# Patient Record
Sex: Male | Born: 1940 | Race: Black or African American | Hispanic: No | Marital: Single | Smoking: Former smoker
Health system: Southern US, Community
[De-identification: ages and names within clinical notes are randomized; demographics above are authoritative.]

## PROBLEM LIST (undated history)

## (undated) ENCOUNTER — Emergency Department (HOSPITAL_COMMUNITY): Discharge status: Against Medical Advice | Payer: Medicare HMO

## (undated) DIAGNOSIS — N400 Enlarged prostate without lower urinary tract symptoms: Secondary | ICD-10-CM

## (undated) DIAGNOSIS — T884XXA Failed or difficult intubation, initial encounter: Secondary | ICD-10-CM

## (undated) DIAGNOSIS — E559 Vitamin D deficiency, unspecified: Secondary | ICD-10-CM

## (undated) DIAGNOSIS — I10 Essential (primary) hypertension: Secondary | ICD-10-CM

## (undated) DIAGNOSIS — H9190 Unspecified hearing loss, unspecified ear: Secondary | ICD-10-CM

## (undated) DIAGNOSIS — M1990 Unspecified osteoarthritis, unspecified site: Secondary | ICD-10-CM

## (undated) DIAGNOSIS — C6290 Malignant neoplasm of unspecified testis, unspecified whether descended or undescended: Secondary | ICD-10-CM

## (undated) DIAGNOSIS — E785 Hyperlipidemia, unspecified: Secondary | ICD-10-CM

## (undated) HISTORY — DX: Vitamin D deficiency, unspecified: E55.9

## (undated) HISTORY — DX: Hyperlipidemia, unspecified: E78.5

## (undated) HISTORY — PX: HEMORROIDECTOMY: SUR656

## (undated) HISTORY — PX: PROSTATE SURGERY: SHX751

## (undated) HISTORY — PX: SCALP LACERATION REPAIR: SHX6089

---

## 2004-02-28 ENCOUNTER — Emergency Department (HOSPITAL_COMMUNITY): Admission: EM | Admit: 2004-02-28 | Discharge: 2004-02-28 | Payer: Self-pay | Admitting: Emergency Medicine

## 2004-06-17 ENCOUNTER — Ambulatory Visit (HOSPITAL_COMMUNITY): Admission: RE | Admit: 2004-06-17 | Discharge: 2004-06-17 | Payer: Self-pay | Admitting: General Surgery

## 2004-07-02 ENCOUNTER — Emergency Department (HOSPITAL_COMMUNITY): Admission: EM | Admit: 2004-07-02 | Discharge: 2004-07-02 | Payer: Self-pay | Admitting: Emergency Medicine

## 2005-02-27 ENCOUNTER — Emergency Department (HOSPITAL_COMMUNITY): Admission: EM | Admit: 2005-02-27 | Discharge: 2005-02-27 | Payer: Self-pay | Admitting: Otolaryngology

## 2005-03-22 ENCOUNTER — Emergency Department (HOSPITAL_COMMUNITY): Admission: EM | Admit: 2005-03-22 | Discharge: 2005-03-22 | Payer: Self-pay | Admitting: Emergency Medicine

## 2005-03-23 ENCOUNTER — Emergency Department (HOSPITAL_COMMUNITY): Admission: EM | Admit: 2005-03-23 | Discharge: 2005-03-23 | Payer: Self-pay | Admitting: Emergency Medicine

## 2005-04-12 ENCOUNTER — Inpatient Hospital Stay (HOSPITAL_COMMUNITY): Admission: EM | Admit: 2005-04-12 | Discharge: 2005-04-13 | Payer: Self-pay | Admitting: Emergency Medicine

## 2005-09-07 ENCOUNTER — Emergency Department (HOSPITAL_COMMUNITY): Admission: EM | Admit: 2005-09-07 | Discharge: 2005-09-07 | Payer: Self-pay | Admitting: Emergency Medicine

## 2005-09-09 ENCOUNTER — Emergency Department (HOSPITAL_COMMUNITY): Admission: EM | Admit: 2005-09-09 | Discharge: 2005-09-09 | Payer: Self-pay | Admitting: Emergency Medicine

## 2005-09-11 ENCOUNTER — Emergency Department (HOSPITAL_COMMUNITY): Admission: EM | Admit: 2005-09-11 | Discharge: 2005-09-11 | Payer: Self-pay | Admitting: Emergency Medicine

## 2006-01-22 ENCOUNTER — Emergency Department (HOSPITAL_COMMUNITY): Admission: EM | Admit: 2006-01-22 | Discharge: 2006-01-22 | Payer: Self-pay | Admitting: Emergency Medicine

## 2006-05-21 ENCOUNTER — Ambulatory Visit (HOSPITAL_COMMUNITY): Admission: RE | Admit: 2006-05-21 | Discharge: 2006-05-21 | Payer: Self-pay | Admitting: Family Medicine

## 2006-11-16 ENCOUNTER — Emergency Department (HOSPITAL_COMMUNITY): Admission: EM | Admit: 2006-11-16 | Discharge: 2006-11-16 | Payer: Self-pay | Admitting: Emergency Medicine

## 2006-12-30 ENCOUNTER — Ambulatory Visit (HOSPITAL_COMMUNITY): Admission: RE | Admit: 2006-12-30 | Discharge: 2006-12-30 | Payer: Self-pay | Admitting: Ophthalmology

## 2007-01-27 ENCOUNTER — Ambulatory Visit (HOSPITAL_COMMUNITY): Admission: RE | Admit: 2007-01-27 | Discharge: 2007-01-27 | Payer: Self-pay | Admitting: Ophthalmology

## 2008-01-28 ENCOUNTER — Emergency Department (HOSPITAL_COMMUNITY): Admission: EM | Admit: 2008-01-28 | Discharge: 2008-01-28 | Payer: Self-pay | Admitting: Emergency Medicine

## 2008-02-13 ENCOUNTER — Emergency Department (HOSPITAL_COMMUNITY): Admission: EM | Admit: 2008-02-13 | Discharge: 2008-02-13 | Payer: Self-pay | Admitting: Emergency Medicine

## 2009-08-08 ENCOUNTER — Inpatient Hospital Stay (HOSPITAL_COMMUNITY)
Admission: EM | Admit: 2009-08-08 | Discharge: 2009-08-12 | Payer: Self-pay | Source: Home / Self Care | Admitting: Emergency Medicine

## 2010-03-29 LAB — GLUCOSE, CAPILLARY
Glucose-Capillary: 102 mg/dL — ABNORMAL HIGH (ref 70–99)
Glucose-Capillary: 102 mg/dL — ABNORMAL HIGH (ref 70–99)
Glucose-Capillary: 104 mg/dL — ABNORMAL HIGH (ref 70–99)
Glucose-Capillary: 108 mg/dL — ABNORMAL HIGH (ref 70–99)
Glucose-Capillary: 108 mg/dL — ABNORMAL HIGH (ref 70–99)
Glucose-Capillary: 111 mg/dL — ABNORMAL HIGH (ref 70–99)
Glucose-Capillary: 119 mg/dL — ABNORMAL HIGH (ref 70–99)
Glucose-Capillary: 122 mg/dL — ABNORMAL HIGH (ref 70–99)
Glucose-Capillary: 141 mg/dL — ABNORMAL HIGH (ref 70–99)
Glucose-Capillary: 142 mg/dL — ABNORMAL HIGH (ref 70–99)
Glucose-Capillary: 95 mg/dL (ref 70–99)

## 2010-03-29 LAB — BASIC METABOLIC PANEL
BUN: 5 mg/dL — ABNORMAL LOW (ref 6–23)
CO2: 17 mEq/L — ABNORMAL LOW (ref 19–32)
CO2: 24 mEq/L (ref 19–32)
Chloride: 109 mEq/L (ref 96–112)
Chloride: 112 mEq/L (ref 96–112)
Chloride: 113 mEq/L — ABNORMAL HIGH (ref 96–112)
GFR calc Af Amer: >60 mL/min (ref >60)
GFR calc non Af Amer: >60 mL/min (ref >60)
Glucose, Bld: 117 mg/dL — ABNORMAL HIGH (ref 70–99)
Potassium: 2.9 mEq/L — ABNORMAL LOW (ref 3.5–5.1)
Potassium: 3.5 mEq/L (ref 3.5–5.1)
Potassium: 3.6 mEq/L (ref 3.5–5.1)
Sodium: 139 mEq/L (ref 135–145)
Sodium: 139 mEq/L (ref 135–145)

## 2010-03-29 LAB — RAPID URINE DRUG SCREEN, HOSP PERFORMED
Barbiturates: NOT DETECTED
Benzodiazepines: NOT DETECTED
Cocaine: NOT DETECTED
Opiates: NOT DETECTED
Tetrahydrocannabinol: NOT DETECTED

## 2010-03-29 LAB — TROPONIN I: Troponin I: 0.01 ng/mL (ref 0.00–0.06)

## 2010-03-29 LAB — DIFFERENTIAL
Basophils Absolute: 0.1 10*3/uL (ref 0.0–0.1)
Lymphocytes Relative: 8 % — ABNORMAL LOW (ref 12–46)
Lymphs Abs: 0.8 10*3/uL (ref 0.7–4.0)
Monocytes Relative: 3 % (ref 3–12)
Neutrophils Relative %: 88 % — ABNORMAL HIGH (ref 43–77)

## 2010-03-29 LAB — ETHANOL: Alcohol, Ethyl (B): 230 mg/dL — ABNORMAL HIGH (ref 0–10)

## 2010-03-29 LAB — HEPATIC FUNCTION PANEL
ALT: 27 U/L (ref 0–53)
AST: 24 U/L (ref 0–37)
Alkaline Phosphatase: 45 U/L (ref 39–117)
Bilirubin, Direct: 0.1 mg/dL (ref 0.0–0.3)
Indirect Bilirubin: 0.6 mg/dL (ref 0.3–0.9)
Total Protein: 5.5 g/dL — ABNORMAL LOW (ref 6.0–8.3)

## 2010-03-29 LAB — CK TOTAL AND CKMB (NOT AT ARMC)
Relative Index: 1.8 (ref 0.0–2.5)
Total CK: 142 U/L (ref 7–232)

## 2010-03-29 LAB — CBC
HCT: 37.2 % — ABNORMAL LOW (ref 39.0–52.0)
Platelets: 189 10*3/uL (ref 150–400)
RBC: 3.54 MIL/uL — ABNORMAL LOW (ref 4.22–5.81)

## 2010-04-29 LAB — CBC
MCV: 105.8 fL — ABNORMAL HIGH (ref 78.0–100.0)
Platelets: 274 10*3/uL (ref 150–400)
RBC: 3.84 MIL/uL — ABNORMAL LOW (ref 4.22–5.81)
WBC: 5.7 10*3/uL (ref 4.0–10.5)

## 2010-04-29 LAB — BASIC METABOLIC PANEL
BUN: 16 mg/dL (ref 6–23)
Calcium: 10.3 mg/dL (ref 8.4–10.5)
Creatinine, Ser: 0.95 mg/dL (ref 0.4–1.5)
GFR calc Af Amer: >60 mL/min (ref >60)
GFR calc non Af Amer: >60 mL/min (ref >60)

## 2010-04-29 LAB — DIFFERENTIAL
Eosinophils Absolute: 0.1 10*3/uL (ref 0.0–0.7)
Lymphocytes Relative: 20 % (ref 12–46)
Lymphs Abs: 1.1 10*3/uL (ref 0.7–4.0)
Monocytes Relative: 5 % (ref 3–12)
Neutro Abs: 4.1 10*3/uL (ref 1.7–7.7)
Neutrophils Relative %: 71 % (ref 43–77)

## 2010-04-29 LAB — ETHANOL: Alcohol, Ethyl (B): 216 mg/dL — ABNORMAL HIGH (ref 0–10)

## 2010-05-28 ENCOUNTER — Emergency Department (HOSPITAL_COMMUNITY)
Admission: EM | Admit: 2010-05-28 | Discharge: 2010-05-28 | Disposition: A | Discharge status: Home / Self Care | Payer: MEDICARE | Attending: Emergency Medicine | Admitting: Emergency Medicine

## 2010-05-28 ENCOUNTER — Emergency Department (HOSPITAL_COMMUNITY): Payer: MEDICARE

## 2010-05-28 DIAGNOSIS — M47812 Spondylosis without myelopathy or radiculopathy, cervical region: Secondary | ICD-10-CM | POA: Insufficient documentation

## 2010-05-28 DIAGNOSIS — M129 Arthropathy, unspecified: Secondary | ICD-10-CM | POA: Insufficient documentation

## 2010-05-28 DIAGNOSIS — I10 Essential (primary) hypertension: Secondary | ICD-10-CM | POA: Insufficient documentation
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
## 2010-05-30 NOTE — Op Note (Signed)
NAME:  [name], MOTTERN NO.:  [id number]   MEDICAL RECORD NO.:  [id number]          PATIENT TYPE:  AMB   LOCATION:  DAY                           FACILITY:  [facility]   PHYSICIAN:  Dirk Dress. Katrinka Blazing, M.D.   DATE OF BIRTH:  July 26, 1940   DATE OF PROCEDURE:  06/17/2004  DATE OF DISCHARGE:                                 OPERATIVE REPORT   PREOPERATIVE DIAGNOSIS:  Right posterior neck mass.   POSTOPERATIVE DIAGNOSIS:  Right posterior neck mass, 6 cm.   PROCEDURE:  Excision of mass, right posterior neck, 6 cm.   SURGEON:  Dr. Katrinka Blazing.   DESCRIPTION:  Under general LMA anesthesia, the patient's neck was prepped  and draped in the sterile field. Transverse incision was made. Excision  extended downward into the subcutaneous tissue. In the subcutaneous tissue,  there was a multilobulated, fatty tissue with multiple fibrous septations.  Using sharp and blunt dissection, this was removed. There was a large vein  in the base of the mass, but this was not injured. The skin and small amount  of subcutaneous tissue was sutured down to the fascia with 0 Monocryl. Skin  was closed with interrupted 3-0 Prolene. Dressing was placed. The patient  was awakened from anesthesia uneventfully, transferred to a bed, and taken  to the post-anesthesia care unit for monitoring.       LCS/MEDQ  D:  06/17/2004  T:  06/17/2004  Job:  [id number]
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
## 2010-10-17 LAB — BASIC METABOLIC PANEL
CO2: 26
Calcium: 9.5
GFR calc Af Amer: >60
Potassium: 4
Sodium: 138

## 2010-10-17 LAB — HEMOGLOBIN AND HEMATOCRIT, BLOOD: HCT: 37.2 — ABNORMAL LOW

## 2011-12-25 ENCOUNTER — Other Ambulatory Visit (HOSPITAL_COMMUNITY): Payer: Self-pay | Admitting: Family Medicine

## 2011-12-25 DIAGNOSIS — M545 Low back pain: Secondary | ICD-10-CM

## 2011-12-29 ENCOUNTER — Ambulatory Visit (HOSPITAL_COMMUNITY)
Admission: RE | Admit: 2011-12-29 | Discharge: 2011-12-29 | Disposition: A | Discharge status: Home / Self Care | Payer: Medicare HMO | Source: Ambulatory Visit | Attending: Family Medicine | Admitting: Family Medicine

## 2011-12-29 DIAGNOSIS — M47817 Spondylosis without myelopathy or radiculopathy, lumbosacral region: Secondary | ICD-10-CM | POA: Insufficient documentation

## 2011-12-29 DIAGNOSIS — M545 Low back pain: Secondary | ICD-10-CM

## 2011-12-29 DIAGNOSIS — M5137 Other intervertebral disc degeneration, lumbosacral region: Secondary | ICD-10-CM | POA: Insufficient documentation

## 2011-12-29 DIAGNOSIS — M51379 Other intervertebral disc degeneration, lumbosacral region without mention of lumbar back pain or lower extremity pain: Secondary | ICD-10-CM | POA: Insufficient documentation

## 2012-03-03 ENCOUNTER — Encounter (INDEPENDENT_AMBULATORY_CARE_PROVIDER_SITE_OTHER): Payer: Self-pay | Admitting: *Deleted

## 2012-03-08 ENCOUNTER — Encounter (HOSPITAL_COMMUNITY): Payer: Self-pay | Admitting: Pharmacy Technician

## 2012-03-08 NOTE — H&P (Signed)
  NTS SOAP Note  Vital Signs:  Vitals as of: 03/08/2012: Systolic 144: Diastolic 67: Heart Rate 67: Temp 97.9F: Height 36ft 11in: Weight 166Lbs 0 Ounces: BMI 23  BMI : 23.15 kg/m2  Subjective: This 72 Years 72 Months old Male presents for a scalp mass.  Has been present for some time.  Recent growth noted.  Previously excised in remote past, thinks it was a lipoma.   Review of Symptoms:  Constitutional:unremarkable   Head:unremarkable    Eyes:unremarkable   Nose/Mouth/Throat:unremarkable Cardiovascular:  unremarkable   Respiratory:unremarkable   Gastrointestinal:  unremarkable   Genitourinary:unremarkable       joint, back, neck pain Skin:unremarkable Hematolgic/Lymphatic:unremarkable     Allergic/Immunologic:unremarkable     Past Medical History:    Reviewed   Past Medical History  Surgical History: none Medical Problems: HTN, BPH Allergies: nkda Medications: naproxin, lotrel, prostate meds   Social History:Reviewed  Social History  Preferred Language: English Race:  Black or African American Ethnicity: Other Age: 72 Years 5 Months Marital Status:  M Alcohol: 6 pack a week Recreational drug(s):  No   Smoking Status: Current every day smoker reviewed on 03/08/2012 Started Date: 01/13/1960 Packs per week:  Functional Status reviewed on mm/dd/yyyy ------------------------------------------------ Bathing: Normal Cooking: Normal Dressing: Normal Driving: Normal Eating: Normal Managing Meds: Normal Oral Care: Normal Shopping: Normal Toileting: Normal Transferring: Normal Walking: Normal Cognitive Status reviewed on mm/dd/yyyy ------------------------------------------------ Attention: Normal Decision Making: Normal Language: Normal Memory: Normal Motor: Normal Perception: Normal Problem Solving: Normal Visual and Spatial: Normal   Family History:  Reviewed   Family History  Is there a family history  AV:[id number]    Objective Information: General:  Well appearing, well nourished in no distress. 6 x 7cm posterior scalp mass, scar present.  Subcutaneous mass, rubbery. Neck:  Supple without lymphadenopathy.  Heart:  RRR, no murmur or gallop.  Normal S1, S2.  No S3, S4.  Lungs:    CTA bilaterally, no wheezes, rhonchi, rales.  Breathing unlabored.  Assessment:Neoplasm, scalp  Diagnosis &amp; Procedure:    Plan:  Scheduled for excision of scalp mass on 03/23/12.   Patient Education:Alternative treatments to surgery were discussed with patient (and family).  Risks and benefits  of procedure were fully explained to the patient (and family) who gave informed consent. Patient/family questions were addressed.  Follow-up:Pending Surgery

## 2012-03-17 ENCOUNTER — Other Ambulatory Visit: Payer: Self-pay

## 2012-03-17 ENCOUNTER — Encounter (HOSPITAL_COMMUNITY): Payer: Self-pay

## 2012-03-17 ENCOUNTER — Encounter (HOSPITAL_COMMUNITY)
Admission: RE | Admit: 2012-03-17 | Discharge: 2012-03-17 | Disposition: A | Discharge status: Home / Self Care | Payer: Medicare HMO | Source: Ambulatory Visit | Attending: General Surgery | Admitting: General Surgery

## 2012-03-17 HISTORY — DX: Unspecified hearing loss, unspecified ear: H91.90

## 2012-03-17 HISTORY — DX: Essential (primary) hypertension: I10

## 2012-03-17 HISTORY — DX: Benign prostatic hyperplasia without lower urinary tract symptoms: N40.0

## 2012-03-17 HISTORY — DX: Unspecified osteoarthritis, unspecified site: M19.90

## 2012-03-17 LAB — BASIC METABOLIC PANEL
BUN: 9 mg/dL (ref 6–23)
GFR calc non Af Amer: >90 mL/min (ref >90)
Glucose, Bld: 75 mg/dL (ref 70–99)
Potassium: 3.6 mEq/L (ref 3.5–5.1)

## 2012-03-17 LAB — CBC WITH DIFFERENTIAL/PLATELET
Basophils Relative: 1 % (ref 0–1)
Eosinophils Absolute: 0 10*3/uL (ref 0.0–0.7)
Hemoglobin: 12.9 g/dL — ABNORMAL LOW (ref 13.0–17.0)
MCH: 34.1 pg — ABNORMAL HIGH (ref 26.0–34.0)
MCHC: 34.2 g/dL (ref 30.0–36.0)
Monocytes Absolute: 0.3 10*3/uL (ref 0.1–1.0)
Monocytes Relative: 8 % (ref 3–12)
Neutrophils Relative %: 70 % (ref 43–77)

## 2012-03-17 NOTE — Patient Instructions (Addendum)
    VUE PAVON  03/17/2012   Your procedure is scheduled on:  03/23/2012  Report to [facility] at  820  AM.  Call this number if you have problems the morning of surgery: [phone]   Remember:   Do not eat food or drink liquids after midnight.   Take these medicines the morning of surgery with A SIP OF WATER:  lotrel   Do not wear jewelry, make-up or nail polish.  Do not wear lotions, powders, or perfumes.  Do not shave 48 hours prior to surgery. Men may shave face and neck.  Do not bring valuables to the hospital.  Contacts, dentures or bridgework may not be worn into surgery.  Leave suitcase in the car. After surgery it may be brought to your room.  For patients admitted to the hospital, checkout time is 11:00 AM the day of discharge.   Patients discharged the day of surgery will not be allowed to drive  home.  Name and phone number of your driver: family  Special Instructions: Shower using CHG 2 nights before surgery and the night before surgery.  If you shower the day of surgery use CHG.  Use special wash - you have one bottle of CHG for all showers.  You should use approximately 1/3 of the bottle for each shower.   Please read over the following fact sheets that you were given: Pain Booklet, Coughing and Deep Breathing, MRSA Information, Surgical Site Infection Prevention, Anesthesia Post-op Instructions and Care and Recovery After Surgery PATIENT INSTRUCTIONS POST-ANESTHESIA  IMMEDIATELY FOLLOWING SURGERY:  Do not drive or operate machinery for the first twenty four hours after surgery.  Do not make any important decisions for twenty four hours after surgery or while taking narcotic pain medications or sedatives.  If you develop intractable nausea and vomiting or a severe headache please notify your doctor immediately.  FOLLOW-UP:  Please make an appointment with your surgeon as instructed. You do not need to follow up with anesthesia unless specifically instructed to do  so.  WOUND CARE INSTRUCTIONS (if applicable):  Keep a dry clean dressing on the anesthesia/puncture wound site if there is drainage.  Once the wound has quit draining you may leave it open to air.  Generally you should leave the bandage intact for twenty four hours unless there is drainage.  If the epidural site drains for more than 36-48 hours please call the anesthesia department.  QUESTIONS?:  Please feel free to call your physician or the hospital operator if you have any questions, and they will be happy to assist you.

## 2012-03-23 ENCOUNTER — Encounter (HOSPITAL_COMMUNITY): Payer: Self-pay | Admitting: *Deleted

## 2012-03-23 ENCOUNTER — Encounter (HOSPITAL_COMMUNITY)
Admission: RE | Disposition: A | Discharge status: Home / Self Care | Payer: Self-pay | Source: Ambulatory Visit | Attending: General Surgery

## 2012-03-23 ENCOUNTER — Ambulatory Visit (HOSPITAL_COMMUNITY)
Admission: RE | Admit: 2012-03-23 | Discharge: 2012-03-23 | Disposition: A | Discharge status: Home / Self Care | Payer: Medicare HMO | Source: Ambulatory Visit | Attending: General Surgery | Admitting: General Surgery

## 2012-03-23 ENCOUNTER — Encounter (HOSPITAL_COMMUNITY): Payer: Self-pay | Admitting: Anesthesiology

## 2012-03-23 ENCOUNTER — Ambulatory Visit (HOSPITAL_COMMUNITY): Payer: Medicare HMO | Admitting: Anesthesiology

## 2012-03-23 DIAGNOSIS — Z01812 Encounter for preprocedural laboratory examination: Secondary | ICD-10-CM | POA: Insufficient documentation

## 2012-03-23 DIAGNOSIS — I10 Essential (primary) hypertension: Secondary | ICD-10-CM | POA: Insufficient documentation

## 2012-03-23 DIAGNOSIS — D1739 Benign lipomatous neoplasm of skin and subcutaneous tissue of other sites: Secondary | ICD-10-CM | POA: Insufficient documentation

## 2012-03-23 DIAGNOSIS — Z01810 Encounter for preprocedural cardiovascular examination: Secondary | ICD-10-CM | POA: Insufficient documentation

## 2012-03-23 HISTORY — PX: LESION EXCISION: SHX5167

## 2012-03-23 SURGERY — EXCISION, LESION, SCALP
Anesthesia: General | Site: Scalp | Wound class: Clean

## 2012-03-23 MED ORDER — KETOROLAC TROMETHAMINE 30 MG/ML IJ SOLN
INTRAMUSCULAR | Status: AC
  Filled 2012-03-23: qty 1

## 2012-03-23 MED ORDER — MIDAZOLAM HCL 2 MG/2ML IJ SOLN
INTRAMUSCULAR | Status: AC
  Filled 2012-03-23: qty 2

## 2012-03-23 MED ORDER — LACTATED RINGERS IV SOLN
INTRAVENOUS | Status: DC
  Administered 2012-03-23 (×2): via INTRAVENOUS

## 2012-03-23 MED ORDER — BUPIVACAINE HCL (PF) 0.5 % IJ SOLN
INTRAMUSCULAR | Status: AC
  Filled 2012-03-23: qty 30

## 2012-03-23 MED ORDER — GLYCOPYRROLATE 0.2 MG/ML IJ SOLN
INTRAMUSCULAR | Status: AC
  Filled 2012-03-23: qty 2

## 2012-03-23 MED ORDER — TRAMADOL HCL 50 MG PO TABS: 50.0000 mg | ORAL_TABLET | Freq: Three times a day (TID) | ORAL | Status: DC

## 2012-03-23 MED ORDER — FENTANYL CITRATE 0.05 MG/ML IJ SOLN
INTRAMUSCULAR | Status: AC
  Filled 2012-03-23: qty 2

## 2012-03-23 MED ORDER — GLYCOPYRROLATE 0.2 MG/ML IJ SOLN
INTRAMUSCULAR | Status: AC
  Filled 2012-03-23: qty 1

## 2012-03-23 MED ORDER — PROPOFOL 10 MG/ML IV EMUL
INTRAVENOUS | Status: AC
  Filled 2012-03-23: qty 20

## 2012-03-23 MED ORDER — GLYCOPYRROLATE 0.2 MG/ML IJ SOLN
0.2000 mg | Freq: Once | INTRAMUSCULAR | Status: AC
  Administered 2012-03-23: 0.2 mg via INTRAVENOUS

## 2012-03-23 MED ORDER — ROCURONIUM BROMIDE 100 MG/10ML IV SOLN
INTRAVENOUS | Status: DC | PRN
  Administered 2012-03-23: 5 mg via INTRAVENOUS
  Administered 2012-03-23: 25 mg via INTRAVENOUS

## 2012-03-23 MED ORDER — ONDANSETRON HCL 4 MG/2ML IJ SOLN
INTRAMUSCULAR | Status: AC
  Filled 2012-03-23: qty 2

## 2012-03-23 MED ORDER — FENTANYL CITRATE 0.05 MG/ML IJ SOLN
INTRAMUSCULAR | Status: DC | PRN
  Administered 2012-03-23 (×2): 50 ug via INTRAVENOUS

## 2012-03-23 MED ORDER — MIDAZOLAM HCL 2 MG/2ML IJ SOLN
1.0000 mg | INTRAMUSCULAR | Status: DC | PRN
  Administered 2012-03-23: 2 mg via INTRAVENOUS

## 2012-03-23 MED ORDER — KETOROLAC TROMETHAMINE 30 MG/ML IJ SOLN
30.0000 mg | Freq: Once | INTRAMUSCULAR | Status: AC
  Administered 2012-03-23: 30 mg via INTRAVENOUS

## 2012-03-23 MED ORDER — SODIUM CHLORIDE 0.9 % IR SOLN
Status: DC | PRN
  Administered 2012-03-23: 1000 mL

## 2012-03-23 MED ORDER — FENTANYL CITRATE 0.05 MG/ML IJ SOLN: 25.0000 ug | INTRAMUSCULAR | Status: DC | PRN

## 2012-03-23 MED ORDER — ONDANSETRON HCL 4 MG/2ML IJ SOLN
4.0000 mg | Freq: Once | INTRAMUSCULAR | Status: AC | PRN
  Administered 2012-03-23: 4 mg via INTRAVENOUS

## 2012-03-23 MED ORDER — GLYCOPYRROLATE 0.2 MG/ML IJ SOLN
INTRAMUSCULAR | Status: DC | PRN
  Administered 2012-03-23: 0.4 mg via INTRAVENOUS

## 2012-03-23 MED ORDER — NEOSTIGMINE METHYLSULFATE 1 MG/ML IJ SOLN
INTRAMUSCULAR | Status: DC | PRN
  Administered 2012-03-23: 2 mg via INTRAVENOUS

## 2012-03-23 MED ORDER — ONDANSETRON HCL 4 MG/2ML IJ SOLN
4.0000 mg | Freq: Once | INTRAMUSCULAR | Status: AC
  Administered 2012-03-23: 4 mg via INTRAVENOUS

## 2012-03-23 MED ORDER — LIDOCAINE HCL (CARDIAC) 10 MG/ML IV SOLN
INTRAVENOUS | Status: DC | PRN
  Administered 2012-03-23: 20 mg via INTRAVENOUS

## 2012-03-23 MED ORDER — PROPOFOL 10 MG/ML IV BOLUS
INTRAVENOUS | Status: DC | PRN
  Administered 2012-03-23: 130 mg via INTRAVENOUS
  Administered 2012-03-23: 20 mg via INTRAVENOUS

## 2012-03-23 MED ORDER — ROCURONIUM BROMIDE 50 MG/5ML IV SOLN
INTRAVENOUS | Status: AC
  Filled 2012-03-23: qty 1

## 2012-03-23 MED ORDER — CHLORHEXIDINE GLUCONATE 4 % EX LIQD: 1.0000 "application " | Freq: Once | CUTANEOUS | Status: DC

## 2012-03-23 MED ORDER — BACITRACIN ZINC 500 UNIT/GM EX OINT
TOPICAL_OINTMENT | CUTANEOUS | Status: AC
  Filled 2012-03-23: qty 1.8

## 2012-03-23 MED ORDER — LIDOCAINE HCL (PF) 1 % IJ SOLN
INTRAMUSCULAR | Status: AC
  Filled 2012-03-23: qty 5

## 2012-03-23 SURGICAL SUPPLY — 35 items
ADH SKN CLS APL DERMABOND .7 (GAUZE/BANDAGES/DRESSINGS)
BAG HAMPER (MISCELLANEOUS) ×2 IMPLANT
CLOTH BEACON ORANGE TIMEOUT ST (SAFETY) ×2 IMPLANT
COVER LIGHT HANDLE STERIS (MISCELLANEOUS) ×4 IMPLANT
DECANTER SPIKE VIAL GLASS SM (MISCELLANEOUS) ×2 IMPLANT
DERMABOND ADVANCED (GAUZE/BANDAGES/DRESSINGS)
DERMABOND ADVANCED .7 DNX12 (GAUZE/BANDAGES/DRESSINGS) IMPLANT
ELECT NDL TIP 2.8 STRL (NEEDLE) IMPLANT
ELECT NEEDLE TIP 2.8 STRL (NEEDLE) ×2 IMPLANT
ELECT REM PT RETURN 9FT ADLT (ELECTROSURGICAL) ×2
ELECTRODE REM PT RTRN 9FT ADLT (ELECTROSURGICAL) ×1 IMPLANT
FORMALIN 10 PREFIL 120ML (MISCELLANEOUS) ×2 IMPLANT
GLOVE BIO SURGEON STRL SZ7.5 (GLOVE) ×2 IMPLANT
GLOVE ECLIPSE 6.5 STRL STRAW (GLOVE) ×1 IMPLANT
GLOVE ECLIPSE 7.0 STRL STRAW (GLOVE) ×1 IMPLANT
GLOVE EXAM NITRILE MD LF STRL (GLOVE) ×1 IMPLANT
GLOVE INDICATOR 7.0 STRL GRN (GLOVE) ×1 IMPLANT
GLOVE INDICATOR 7.5 STRL GRN (GLOVE) ×1 IMPLANT
GOWN STRL REIN XL XLG (GOWN DISPOSABLE) ×6 IMPLANT
KIT ROOM TURNOVER APOR (KITS) ×2 IMPLANT
MANIFOLD NEPTUNE II (INSTRUMENTS) ×2 IMPLANT
NDL HYPO 25X1 1.5 SAFETY (NEEDLE) ×1 IMPLANT
NEEDLE HYPO 25X1 1.5 SAFETY (NEEDLE) ×2 IMPLANT
NS IRRIG 1000ML POUR BTL (IV SOLUTION) ×2 IMPLANT
PACK MINOR (CUSTOM PROCEDURE TRAY) ×1 IMPLANT
PAD ARMBOARD 7.5X6 YLW CONV (MISCELLANEOUS) ×2 IMPLANT
SET BASIN LINEN APH (SET/KITS/TRAYS/PACK) ×2 IMPLANT
SPONGE GAUZE 4X4 12PLY (GAUZE/BANDAGES/DRESSINGS) ×1 IMPLANT
SUT ETHILON 3 0 FSL (SUTURE) ×2 IMPLANT
SUT PROLENE 4 0 PS 2 18 (SUTURE) IMPLANT
SUT VIC AB 3-0 SH 27 (SUTURE) ×2
SUT VIC AB 3-0 SH 27X BRD (SUTURE) IMPLANT
SUT VIC AB 4-0 PS2 27 (SUTURE) IMPLANT
SYR CONTROL 10ML LL (SYRINGE) ×2 IMPLANT
TAPE CLOTH SURG 4X10 WHT LF (GAUZE/BANDAGES/DRESSINGS) ×1 IMPLANT

## 2012-03-23 NOTE — Anesthesia Postprocedure Evaluation (Signed)
Anesthesia Post Note  Patient: Nathaniel Hicks  Procedure(s) Performed: Procedure(s) (LRB): EXCISION NEOPLASM SCALP  (N/A)  Anesthesia type: General  Patient location: PACU  Post pain: Pain level controlled  Post assessment: Post-op Vital signs reviewed, Patient's Cardiovascular Status Stable, Respiratory Function Stable, Patent Airway, No signs of Nausea or vomiting and Pain level controlled  Last Vitals:  Filed Vitals:   03/23/12 1159  BP: 151/60  Pulse: 88  Temp: 36.3 C  Resp: 17    Post vital signs: Reviewed and stable  Level of consciousness: awake and alert   Complications: No apparent anesthesia complications

## 2012-03-23 NOTE — Anesthesia Preprocedure Evaluation (Signed)
Anesthesia Evaluation  Patient identified by MRN, date of birth, ID band Patient awake    Reviewed: Allergy & Precautions, H&P , NPO status , Patient's Chart, lab work & pertinent test results  Airway Mallampati: II      Dental  (+) Teeth Intact   Pulmonary Current Smoker,  breath sounds clear to auscultation        Cardiovascular hypertension, Pt. on medications Rhythm:Regular Rate:Normal     Neuro/Psych    GI/Hepatic negative GI ROS, (+)     substance abuse  alcohol use,   Endo/Other    Renal/GU      Musculoskeletal   Abdominal   Peds  Hematology   Anesthesia Other Findings   Reproductive/Obstetrics                           Anesthesia Physical Anesthesia Plan  ASA: II  Anesthesia Plan: General   Post-op Pain Management:    Induction: Intravenous  Airway Management Planned: Oral ETT  Additional Equipment:   Intra-op Plan:   Post-operative Plan: Extubation in OR  Informed Consent: I have reviewed the patients History and Physical, chart, labs and discussed the procedure including the risks, benefits and alternatives for the proposed anesthesia with the patient or authorized representative who has indicated his/her understanding and acceptance.     Plan Discussed with:   Anesthesia Plan Comments:         Anesthesia Quick Evaluation

## 2012-03-23 NOTE — Transfer of Care (Signed)
Immediate Anesthesia Transfer of Care Note  Patient: Nathaniel Hicks  Procedure(s) Performed: Procedure(s) (LRB): EXCISION NEOPLASM SCALP  (N/A)  Patient Location: PACU  Anesthesia Type: General  Level of Consciousness: awake  Airway & Oxygen Therapy: Patient Spontanous Breathing and non-rebreather face mask  Post-op Assessment: Report given to PACU RN, Post -op Vital signs reviewed and stable and Patient moving all extremities  Post vital signs: Reviewed and stable  Complications: No apparent anesthesia complications

## 2012-03-23 NOTE — Op Note (Signed)
Patient:  Nathaniel Hicks  DOB:  03-Feb-1940  MRN:  [id number]   Preop Diagnosis:  Subcutaneous mass, scalp  Postop Diagnosis:  Same  Procedure:  Excision of subcutaneous neoplasm, scalp  Surgeon:  Franky Macho, M.D.  Anes:  General endotracheal  Indications:  Patient is a 72 year old black male who had a subcutaneous mass removed in the past from the posterior right scalp region. It has since recurred and it is enlarging in size. It is greater than 6 cm in diameter. The risks and benefits of the procedure including bleeding, infection, and recurrence of the mass were fully explained to the patient, who gave informed consent.  Procedure note:  The patient was placed in the prone position after induction of general endotracheal anesthesia. The posterior scalp region was prepped and draped using usual sterile technique with Betadine. Surgical site confirmation was performed.  A transverse incision was made at the hairline of the posterior scalp/ superior neck region. The dissection was taken down to the subcutaneous tissue. The adipose tissue that was present was noted to be scarred down to the underlying fascial muscle layer. Sharp dissection using Bovie electrocautery was performed. The mass appeared to be lipomatous in nature. It was sent to pathology further examination. A bleeding was controlled using Bovie electrocautery. The subcutaneous layer was reapproximated using 3-0 Vicryl interrupted suture. The skin was closed using 3-0 nylon interrupted sutures. 0.5% Sensorcaine was instilled the surrounding wound. Bacitracin ointment and dry sterile dressing were applied.  All tape and needle counts were correct at the end of the procedure. The patient was extubated in the operating room and transferred to PACU in stable condition.  Complications:  None  EBL:  Minimal  Specimen:  Neoplasm, subcutaneous, scalp

## 2012-03-23 NOTE — Interval H&P Note (Signed)
History and Physical Interval Note:  03/23/2012 8:44 AM  Nathaniel Hicks  has presented today for surgery, with the diagnosis of neoplasm scalp  The various methods of treatment have been discussed with the patient and family. After consideration of risks, benefits and other options for treatment, the patient has consented to  Procedure(s): EXCISION NEOPLASM SCALP  (N/A) as a surgical intervention .  The patient's history has been reviewed, patient examined, no change in status, stable for surgery.  I have reviewed the patient's chart and labs.  Questions were answered to the patient's satisfaction.     Franky Macho A

## 2012-03-24 ENCOUNTER — Encounter (HOSPITAL_COMMUNITY): Payer: Self-pay | Admitting: General Surgery

## 2012-05-04 ENCOUNTER — Telehealth: Payer: Self-pay | Admitting: Family Medicine

## 2012-05-04 NOTE — Telephone Encounter (Signed)
Patient phone went out see next nessage

## 2012-05-04 NOTE — Telephone Encounter (Signed)
He is not a patient of mine, if you look in his chart he has never been seen by me, please call this patient back and ask him to call his primary doctor.

## 2012-05-04 NOTE — Telephone Encounter (Signed)
Patient is aware 

## 2012-09-05 ENCOUNTER — Telehealth (INDEPENDENT_AMBULATORY_CARE_PROVIDER_SITE_OTHER): Payer: Self-pay | Admitting: *Deleted

## 2012-09-05 ENCOUNTER — Encounter (INDEPENDENT_AMBULATORY_CARE_PROVIDER_SITE_OTHER): Payer: Self-pay | Admitting: *Deleted

## 2012-09-05 ENCOUNTER — Other Ambulatory Visit (INDEPENDENT_AMBULATORY_CARE_PROVIDER_SITE_OTHER): Payer: Self-pay | Admitting: *Deleted

## 2012-09-05 DIAGNOSIS — Z1211 Encounter for screening for malignant neoplasm of colon: Secondary | ICD-10-CM

## 2012-09-05 MED ORDER — PEG-KCL-NACL-NASULF-NA ASC-C 100 G PO SOLR: 1.0000 | Freq: Once | ORAL | Status: DC

## 2012-09-05 NOTE — Telephone Encounter (Addendum)
Patient needs movi prep -- nkda 

## 2012-09-06 ENCOUNTER — Encounter (INDEPENDENT_AMBULATORY_CARE_PROVIDER_SITE_OTHER): Payer: Self-pay | Admitting: *Deleted

## 2012-10-27 ENCOUNTER — Telehealth (INDEPENDENT_AMBULATORY_CARE_PROVIDER_SITE_OTHER): Payer: Self-pay | Admitting: *Deleted

## 2012-10-27 NOTE — Telephone Encounter (Signed)
  Procedure: tcs  Reason/Indication:  screening  Has patient had this procedure before?  no  If so, when, by whom and where?    Is there a family history of colon cancer?  no  Who?  What age when diagnosed?    Is patient diabetic?   no      Does patient have prosthetic heart valve?  no  Do you have a pacemaker?  no  Has patient ever had endocarditis? no  Has patient had joint replacement within last 12 months?  no  Does patient tend to be constipated or take laxatives? no  Is patient on Coumadin, Plavix and/or Aspirin? no  Medications: rapaflo 8 mg daily, naproxen 500 mg bid, tramadol 50 mg tid, amlodipine 10/20 mg daily  Allergies: nkda  Medication Adjustment:   Procedure date & time: 11/24/12 at 730

## 2012-10-27 NOTE — Telephone Encounter (Signed)
agree

## 2012-11-09 ENCOUNTER — Encounter (HOSPITAL_COMMUNITY): Payer: Self-pay | Admitting: Pharmacy Technician

## 2012-11-24 ENCOUNTER — Encounter (HOSPITAL_COMMUNITY): Payer: Self-pay | Admitting: *Deleted

## 2012-11-24 ENCOUNTER — Encounter (HOSPITAL_COMMUNITY)
Admission: RE | Disposition: A | Discharge status: Home / Self Care | Payer: Self-pay | Source: Ambulatory Visit | Attending: Internal Medicine

## 2012-11-24 ENCOUNTER — Ambulatory Visit (HOSPITAL_COMMUNITY)
Admission: RE | Admit: 2012-11-24 | Discharge: 2012-11-24 | Disposition: A | Discharge status: Home / Self Care | Payer: Medicare HMO | Source: Ambulatory Visit | Attending: Internal Medicine | Admitting: Internal Medicine

## 2012-11-24 ENCOUNTER — Other Ambulatory Visit (INDEPENDENT_AMBULATORY_CARE_PROVIDER_SITE_OTHER): Payer: Self-pay | Admitting: Internal Medicine

## 2012-11-24 DIAGNOSIS — N508 Other specified disorders of male genital organs: Secondary | ICD-10-CM | POA: Insufficient documentation

## 2012-11-24 DIAGNOSIS — K644 Residual hemorrhoidal skin tags: Secondary | ICD-10-CM | POA: Insufficient documentation

## 2012-11-24 DIAGNOSIS — D126 Benign neoplasm of colon, unspecified: Secondary | ICD-10-CM

## 2012-11-24 DIAGNOSIS — N433 Hydrocele, unspecified: Secondary | ICD-10-CM | POA: Insufficient documentation

## 2012-11-24 DIAGNOSIS — I10 Essential (primary) hypertension: Secondary | ICD-10-CM | POA: Insufficient documentation

## 2012-11-24 DIAGNOSIS — Z1211 Encounter for screening for malignant neoplasm of colon: Secondary | ICD-10-CM | POA: Insufficient documentation

## 2012-11-24 DIAGNOSIS — N5089 Other specified disorders of the male genital organs: Secondary | ICD-10-CM

## 2012-11-24 DIAGNOSIS — K5730 Diverticulosis of large intestine without perforation or abscess without bleeding: Secondary | ICD-10-CM

## 2012-11-24 HISTORY — PX: COLONOSCOPY: SHX5424

## 2012-11-24 SURGERY — COLONOSCOPY
Anesthesia: Moderate Sedation

## 2012-11-24 MED ORDER — MEPERIDINE HCL 50 MG/ML IJ SOLN
INTRAMUSCULAR | Status: AC
  Filled 2012-11-24: qty 1

## 2012-11-24 MED ORDER — MIDAZOLAM HCL 5 MG/5ML IJ SOLN
INTRAMUSCULAR | Status: DC | PRN
  Administered 2012-11-24: 1 mg via INTRAVENOUS
  Administered 2012-11-24: 2 mg via INTRAVENOUS

## 2012-11-24 MED ORDER — MEPERIDINE HCL 50 MG/ML IJ SOLN
INTRAMUSCULAR | Status: DC | PRN
  Administered 2012-11-24 (×2): 25 mg via INTRAVENOUS

## 2012-11-24 MED ORDER — STERILE WATER FOR IRRIGATION IR SOLN
Status: DC | PRN
  Administered 2012-11-24: 08:00:00

## 2012-11-24 MED ORDER — MIDAZOLAM HCL 5 MG/5ML IJ SOLN
INTRAMUSCULAR | Status: AC
  Filled 2012-11-24: qty 10

## 2012-11-24 MED ORDER — SODIUM CHLORIDE 0.9 % IV SOLN
INTRAVENOUS | Status: DC
  Administered 2012-11-24: 07:00:00 via INTRAVENOUS

## 2012-11-24 NOTE — Progress Notes (Signed)
To radiology via stretcher for scrotal ultrasound.

## 2012-11-24 NOTE — H&P (Signed)
Nathaniel Hicks is an 72 y.o. male.   Chief Complaint: Patient is here for colonoscopy. HPI: Patient is a 72 year old African [place] male who is here for screening colonoscopy. This is patient's first exam. He denies abdominal pain change in his bowel habits or rectal bleeding. Family history is negative for CRC.  Past Medical History  Diagnosis Date  . Hypertension   . BPH (benign prostatic hyperplasia)   . HOH (hard of hearing)   . Arthritis     Past Surgical History  Procedure Laterality Date  . Scalp laceration repair      [facility]-Dr Katrinka Blazing  . Hemorroidectomy    . Lesion excision N/A 03/23/2012    Procedure: EXCISION NEOPLASM SCALP ;  Surgeon: Dalia Heading, MD;  Location: [facility];  Service: General;  Laterality: N/A;  Excision of Scalp Neoplasm    No family history on file. Social History:  reports that he has been smoking Cigarettes.  He has a 25 pack-year smoking history. He does not have any smokeless tobacco history on file. He reports that he drinks alcohol. He reports that he does not use illicit drugs.  Allergies: No Known Allergies  Medications Prior to Admission  Medication Sig Dispense Refill  . amLODipine-benazepril (LOTREL) 10-20 MG per capsule Take 1 capsule by mouth daily.      . naproxen (NAPROSYN) 500 MG tablet Take 500 mg by mouth 2 (two) times daily with a meal.      . peg 3350 powder (MOVIPREP) 100 G SOLR Take 1 kit (200 g total) by mouth once.  1 kit  0  . tamsulosin (FLOMAX) 0.4 MG CAPS Take 0.4 mg by mouth daily.      . traMADol (ULTRAM) 50 MG tablet Take 1 tablet (50 mg total) by mouth 3 (three) times daily.  50 tablet  0    No results found for this or any previous visit (from the past 48 hour(s)). No results found.  ROS  Blood pressure 142/63, pulse 72, temperature 97.7 F (36.5 C), temperature source Oral, resp. rate 15, height 5\' 11"  (1.803 m), weight 160 lb (72.576 kg), SpO2 96.00%. Physical Exam  Constitutional: He appears well-developed and  well-nourished.  HENT:  Mouth/Throat: Oropharynx is clear and moist.  Eyes: Conjunctivae are normal. No scleral icterus.  Neck: No thyromegaly present.  Cardiovascular: Normal rate, regular rhythm and normal heart sounds.   No murmur heard. Respiratory: Effort normal and breath sounds normal.  GI: Soft. He exhibits no distension and no mass. There is no tenderness.  Musculoskeletal: He exhibits no edema.  Lymphadenopathy:    He has no cervical adenopathy.  Neurological: He is alert.  Skin: Skin is warm and dry.     Assessment/Plan Average risk screening colonoscopy.  REHMAN,NAJEEB U 11/24/2012, 7:37 AM

## 2012-11-24 NOTE — Progress Notes (Signed)
Back to post op via stretcher. Dr Karilyn Cota to call pt and sister later today with results.

## 2012-11-24 NOTE — Op Note (Signed)
COLONOSCOPY PROCEDURE REPORT  PATIENT:  Nathaniel Hicks  MR#:  [id number] Birthdate:  Dec 22, 1940, 72 y.o., male Endoscopist:  Dr. Malissa Hippo, MD Referred By:  Dr. Duke Salvia. Dondiego, MD Procedure Date: 11/24/2012  Procedure:   Colonoscopy  Indications:  Patient is 72 year old white male who is here for average risk screening colonoscopy.  Informed Consent:  The procedure and risks were reviewed with the patient and informed consent was obtained.  Medications:  Demerol 50 mg IV Versed 3 mg IV  Description of procedure:  After a digital rectal exam was performed, that colonoscope was advanced from the anus through the rectum and colon to the area of the cecum, ileocecal valve and appendiceal orifice. The cecum was deeply intubated. These structures were well-seen and photographed for the record. From the level of the cecum and ileocecal valve, the scope was slowly and cautiously withdrawn. The mucosal surfaces were carefully surveyed utilizing scope tip to flexion to facilitate fold flattening as needed. The scope was pulled down into the rectum where a thorough exam including retroflexion was performed.  Findings:   Prep excellent. 4 mm polyp cold snared from splenic flexure. Single small doublets of limited sigmoid colon. Normal rectal mucosa. Small hemorrhoids below the dentate line.   Therapeutic/Diagnostic Maneuvers Performed:  See above  Complications:  None  Cecal Withdrawal Time:  27 minutes  Impression:  Examination performed to cecum. Single small polyp cold snared from splenic flexure. Single small doublets of limited sigmoid colon. External hemorrhoids.  Recommendations:  Standard instructions given. I will contact patient with biopsy results and further recommendations. Patient will be scheduled for scrotal ultrasound since he has mass.  [name]  11/24/2012 8:24 AM  CC: Dr. Isabella Stalling, MD & Dr. Bonnetta Barry ref. provider found

## 2012-11-28 ENCOUNTER — Encounter (HOSPITAL_COMMUNITY): Payer: Self-pay | Admitting: Internal Medicine

## 2012-12-12 ENCOUNTER — Telehealth (INDEPENDENT_AMBULATORY_CARE_PROVIDER_SITE_OTHER): Payer: Self-pay | Admitting: *Deleted

## 2012-12-12 NOTE — Telephone Encounter (Signed)
Called Dr. Daivd Council saw Nathaniel Hicks on 11/17 and they made a referral to Dr. Jerre Simon and was seen 11/19 by them.  The sister also called their office leaving a message similar to ours.  Their office is going to call her and talk to her and find out what it is she is truly wanting.

## 2012-12-12 NOTE — Telephone Encounter (Signed)
Message left Friday 12:28  I believe the name left was Lagretta at [phone]  They thought Dr. Janna Arch office was going to call them and they have not heard anything.  He had a TCS back on 11/13 and then a sonogram.  They would like results of sonogram and also his son would like a copy of this report Nathaniel Hicks.  They also would like a referral to who ever you feel is the best to send him to for his health issues.   They are very concerned and would love you to please give them a call.

## 2012-12-12 NOTE — Telephone Encounter (Signed)
I see where Dr. Karilyn Cota did give the result to Lagretta and I will call Janeece Fitting office this morning to see where they are in making the urology referral.

## 2012-12-12 NOTE — Telephone Encounter (Signed)
Call returned but no answer. Nathaniel Hicks is scheduled for radical orchiectomy by Dr. Jerre Simon on 12/20/2012

## 2012-12-14 ENCOUNTER — Encounter (HOSPITAL_COMMUNITY): Payer: Self-pay | Admitting: Pharmacy Technician

## 2012-12-14 NOTE — Patient Instructions (Signed)
Nathaniel Hicks  12/14/2012   Your procedure is scheduled on:  12/20/12  Report to [name] at 06:15 AM.  Call this number if you have problems the morning of surgery: [phone]   Remember:   Do not eat food or drink liquids after midnight.   Take these medicines the morning of surgery with A SIP OF WATER: Amlodipine-Benazepril. You may take your Tramadol if needed.   Do not wear jewelry, make-up or nail polish.  Do not wear lotions, powders, or perfumes.   Do not shave 48 hours prior to surgery. Men may shave face and neck.  Do not bring valuables to the hospital.  [name] is not responsible for any belongings or valuables.               Contacts, dentures or bridgework may not be worn into surgery.  Leave suitcase in the car. After surgery it may be brought to your room.  For patients admitted to the hospital, discharge time is determined by your treatment team.               Patients discharged the day of surgery will not be allowed to drive home.   Special Instructions: Shower using CHG 2 nights before surgery and the night before surgery.  If you shower the day of surgery use CHG.  Use special wash - you have one bottle of CHG for all showers.  You should use approximately 1/3 of the bottle for each shower.   Please read over the following fact sheets that you were given: Pain Booklet, Anesthesia Post-op Instructions and Care and Recovery After Surgery    Orchiectomy An orchiectomy is the removal of the testicles. It is most often done to treat cancer of the prostate. It is also done to treat cancer of the testicles. The testicles can be replaced with artificial testicles. LET [facility] CARE PROVIDER KNOW ABOUT:  Any allergies you have.  All medicines you are taking, including vitamins, herbs, eye drops, creams, and over-the-counter medicines.  Previous problems you or members of your family have had with the use of anesthetics.  Any blood disorders you  have.  Previous surgeries you have had.  Medical conditions you have. RISKS AND COMPLICATIONS Generally, orchiectomy is a safe procedure. However, as with any procedure, complications can occur. Possible complications include:  Infection of the surgical site.  Bleeding inside the the sac that holds your testicles (scrotum). This is called a scrotal hematoma.  Discharge from the surgical site. BEFORE THE PROCEDURE  You may be asked to wash your genital area with sterile soap the morning of your procedure.  You may be given an oral antibiotic, which you should take with a sip of water as prescribed by your physician.  You will generally not be allowed to eat or drink for the previous 8 hours prior to your surgery. PROCEDURE  This surgery is done with the use of a local anesthetic. Sometimes a general anesthetic or light sedation may be used and you may be sleeping during the procedure. If your procedure is indicated for treatment of prostate cancer, the incision will be in the scrotum. If your procedure is for testicular cancer, the incision will be in the groin.After the removal, the incision will be closed. A sterile dressing will be applied to the incision site. You may have a scrotal support. This elevates the scrotum, thereby relieving pressure on the surgical site.  AFTER THE PROCEDURE After the procedure, you will  be taken to the recovery area, where a nurse will watch you and check your progress. Once you are awake, stable, and taking fluids well, without other problems, you will be allowed to go home. In those cases where your scrotal support irritates your incision site, you may remove the support. It is okay if the dressing comes off, especially at night. Air will help a scab to form, which will eliminate the need for dressings during the day. Document Released: 11/28/2004 Document Revised: 08/31/2012 Document Reviewed: 06/01/2012 [facility] Patient Information 2014 [place],  [place].    PATIENT INSTRUCTIONS POST-ANESTHESIA  IMMEDIATELY FOLLOWING SURGERY:  Do not drive or operate machinery for the first twenty four hours after surgery.  Do not make any important decisions for twenty four hours after surgery or while taking narcotic pain medications or sedatives.  If you develop intractable nausea and vomiting or a severe headache please notify your doctor immediately.  FOLLOW-UP:  Please make an appointment with your surgeon as instructed. You do not need to follow up with anesthesia unless specifically instructed to do so.  WOUND CARE INSTRUCTIONS (if applicable):  Keep a dry clean dressing on the anesthesia/puncture wound site if there is drainage.  Once the wound has quit draining you may leave it open to air.  Generally you should leave the bandage intact for twenty four hours unless there is drainage.  If the epidural site drains for more than 36-48 hours please call the anesthesia department.  QUESTIONS?:  Please feel free to call your physician or the hospital operator if you have any questions, and they will be happy to assist you.

## 2012-12-15 ENCOUNTER — Encounter (HOSPITAL_COMMUNITY): Payer: Self-pay

## 2012-12-15 ENCOUNTER — Other Ambulatory Visit: Payer: Self-pay

## 2012-12-15 ENCOUNTER — Encounter (HOSPITAL_COMMUNITY)
Admission: RE | Admit: 2012-12-15 | Discharge: 2012-12-15 | Disposition: A | Discharge status: Home / Self Care | Payer: Medicare HMO | Source: Ambulatory Visit | Attending: Urology | Admitting: Urology

## 2012-12-15 DIAGNOSIS — Z01818 Encounter for other preprocedural examination: Secondary | ICD-10-CM | POA: Insufficient documentation

## 2012-12-15 DIAGNOSIS — Z01812 Encounter for preprocedural laboratory examination: Secondary | ICD-10-CM | POA: Insufficient documentation

## 2012-12-15 DIAGNOSIS — Z01810 Encounter for preprocedural cardiovascular examination: Secondary | ICD-10-CM | POA: Insufficient documentation

## 2012-12-15 LAB — BASIC METABOLIC PANEL
CO2: 26 mEq/L (ref 19–32)
Calcium: 10.4 mg/dL (ref 8.4–10.5)
GFR calc non Af Amer: 72 mL/min — ABNORMAL LOW (ref >90)
Glucose, Bld: 107 mg/dL — ABNORMAL HIGH (ref 70–99)
Sodium: 139 mEq/L (ref 135–145)

## 2012-12-15 LAB — HEMOGLOBIN AND HEMATOCRIT, BLOOD: HCT: 38.5 % — ABNORMAL LOW (ref 39.0–52.0)

## 2012-12-20 ENCOUNTER — Observation Stay (HOSPITAL_COMMUNITY)
Admission: RE | Admit: 2012-12-20 | Discharge: 2012-12-21 | Disposition: A | Discharge status: Home / Self Care | Payer: Medicare HMO | Source: Ambulatory Visit | Attending: Urology | Admitting: Urology

## 2012-12-20 ENCOUNTER — Encounter (HOSPITAL_COMMUNITY): Payer: Medicare HMO | Admitting: Anesthesiology

## 2012-12-20 ENCOUNTER — Encounter (HOSPITAL_COMMUNITY)
Admission: RE | Disposition: A | Discharge status: Home / Self Care | Payer: Self-pay | Source: Ambulatory Visit | Attending: Urology

## 2012-12-20 ENCOUNTER — Encounter (HOSPITAL_COMMUNITY): Payer: Self-pay | Admitting: *Deleted

## 2012-12-20 ENCOUNTER — Ambulatory Visit (HOSPITAL_COMMUNITY): Payer: Medicare HMO | Admitting: Anesthesiology

## 2012-12-20 DIAGNOSIS — C6290 Malignant neoplasm of unspecified testis, unspecified whether descended or undescended: Principal | ICD-10-CM | POA: Insufficient documentation

## 2012-12-20 DIAGNOSIS — Z01812 Encounter for preprocedural laboratory examination: Secondary | ICD-10-CM | POA: Insufficient documentation

## 2012-12-20 HISTORY — PX: ORCHIECTOMY: SHX2116

## 2012-12-20 SURGERY — ORCHIECTOMY
Anesthesia: Spinal | Site: Abdomen | Laterality: Right

## 2012-12-20 MED ORDER — BUPIVACAINE IN DEXTROSE 0.75-8.25 % IT SOLN
INTRATHECAL | Status: AC
  Filled 2012-12-20: qty 2

## 2012-12-20 MED ORDER — BACITRACIN 500 UNIT/GM EX OINT
TOPICAL_OINTMENT | CUTANEOUS | Status: DC | PRN
  Administered 2012-12-20: 2 via TOPICAL

## 2012-12-20 MED ORDER — ONDANSETRON HCL 4 MG/2ML IJ SOLN
INTRAMUSCULAR | Status: AC
  Filled 2012-12-20: qty 2

## 2012-12-20 MED ORDER — TAMSULOSIN HCL 0.4 MG PO CAPS
0.4000 mg | ORAL_CAPSULE | Freq: Every day | ORAL | Status: DC
  Administered 2012-12-20 – 2012-12-21 (×2): 0.4 mg via ORAL
  Filled 2012-12-20 (×2): qty 1

## 2012-12-20 MED ORDER — DEXTROSE-NACL 5-0.45 % IV SOLN
INTRAVENOUS | Status: DC
  Administered 2012-12-20 – 2012-12-21 (×3): via INTRAVENOUS

## 2012-12-20 MED ORDER — BACITRACIN ZINC 500 UNIT/GM EX OINT
TOPICAL_OINTMENT | CUTANEOUS | Status: AC
  Filled 2012-12-20: qty 1.8

## 2012-12-20 MED ORDER — FENTANYL CITRATE 0.05 MG/ML IJ SOLN
25.0000 ug | INTRAMUSCULAR | Status: DC
  Administered 2012-12-20: 25 ug via INTRAVENOUS

## 2012-12-20 MED ORDER — BUPIVACAINE HCL (PF) 0.5 % IJ SOLN
INTRAMUSCULAR | Status: AC
  Filled 2012-12-20: qty 30

## 2012-12-20 MED ORDER — GLYCOPYRROLATE 0.2 MG/ML IJ SOLN
INTRAMUSCULAR | Status: DC | PRN
  Administered 2012-12-20: 0.2 mg via INTRAVENOUS

## 2012-12-20 MED ORDER — MIDAZOLAM HCL 2 MG/2ML IJ SOLN
1.0000 mg | INTRAMUSCULAR | Status: DC | PRN
  Administered 2012-12-20: 2 mg via INTRAVENOUS

## 2012-12-20 MED ORDER — LIDOCAINE HCL (PF) 1 % IJ SOLN
INTRAMUSCULAR | Status: AC
  Filled 2012-12-20: qty 5

## 2012-12-20 MED ORDER — FENTANYL CITRATE 0.05 MG/ML IJ SOLN
INTRAMUSCULAR | Status: DC | PRN
  Administered 2012-12-20 (×3): 12.5 ug via INTRAVENOUS

## 2012-12-20 MED ORDER — LACTATED RINGERS IV SOLN
INTRAVENOUS | Status: DC
  Administered 2012-12-20 (×2): via INTRAVENOUS

## 2012-12-20 MED ORDER — PROPOFOL 10 MG/ML IV EMUL
INTRAVENOUS | Status: AC
  Filled 2012-12-20: qty 20

## 2012-12-20 MED ORDER — ONDANSETRON HCL 4 MG/2ML IJ SOLN: 4.0000 mg | Freq: Once | INTRAMUSCULAR | Status: DC | PRN

## 2012-12-20 MED ORDER — PROPOFOL INFUSION 10 MG/ML OPTIME
INTRAVENOUS | Status: DC | PRN
  Administered 2012-12-20: 75 ug/kg/min via INTRAVENOUS

## 2012-12-20 MED ORDER — FENTANYL CITRATE 0.05 MG/ML IJ SOLN
INTRAMUSCULAR | Status: DC | PRN
  Administered 2012-12-20: 12.5 ug via INTRATHECAL

## 2012-12-20 MED ORDER — ONDANSETRON HCL 4 MG/2ML IJ SOLN
4.0000 mg | Freq: Once | INTRAMUSCULAR | Status: AC
  Administered 2012-12-20: 4 mg via INTRAVENOUS

## 2012-12-20 MED ORDER — MIDAZOLAM HCL 2 MG/2ML IJ SOLN
INTRAMUSCULAR | Status: AC
  Filled 2012-12-20: qty 2

## 2012-12-20 MED ORDER — FENTANYL CITRATE 0.05 MG/ML IJ SOLN: 25.0000 ug | INTRAMUSCULAR | Status: DC | PRN

## 2012-12-20 MED ORDER — FENTANYL CITRATE 0.05 MG/ML IJ SOLN
INTRAMUSCULAR | Status: AC
Start: 2012-12-20 — End: 2012-12-20
  Filled 2012-12-20: qty 2

## 2012-12-20 MED ORDER — FENTANYL CITRATE 0.05 MG/ML IJ SOLN
INTRAMUSCULAR | Status: AC
  Filled 2012-12-20: qty 2

## 2012-12-20 MED ORDER — SODIUM CHLORIDE 0.9 % IR SOLN
Status: DC | PRN
  Administered 2012-12-20: 1000 mL

## 2012-12-20 MED ORDER — HYDROMORPHONE HCL PF 1 MG/ML IJ SOLN
1.0000 mg | INTRAMUSCULAR | Status: DC | PRN
  Administered 2012-12-20 – 2012-12-21 (×3): 1 mg via INTRAVENOUS
  Filled 2012-12-20 (×3): qty 1

## 2012-12-20 MED ORDER — BUPIVACAINE IN DEXTROSE 0.75-8.25 % IT SOLN
INTRATHECAL | Status: DC | PRN
  Administered 2012-12-20: 15 mg via INTRATHECAL

## 2012-12-20 SURGICAL SUPPLY — 49 items
BLADE SURG 15 STRL LF DISP TIS (BLADE) IMPLANT
BLADE SURG 15 STRL SS (BLADE) ×2
CATH ROBINSON RED A/P 18FR (CATHETERS) ×2 IMPLANT
CLOTH BEACON ORANGE TIMEOUT ST (SAFETY) ×2 IMPLANT
COVER LIGHT HANDLE STERIS (MISCELLANEOUS) ×4 IMPLANT
DRAIN PENROSE 12X.25 LTX STRL (MISCELLANEOUS) ×3 IMPLANT
DRSG TEGADERM 2-3/8X2-3/4 SM (GAUZE/BANDAGES/DRESSINGS) ×1 IMPLANT
DRSG TEGADERM 4X4.75 (GAUZE/BANDAGES/DRESSINGS) ×1 IMPLANT
FORMALIN 10 PREFIL 480ML (MISCELLANEOUS) ×2 IMPLANT
GLOVE BIO SURGEON STRL SZ7 (GLOVE) ×2 IMPLANT
GLOVE BIOGEL PI IND STRL 7.0 (GLOVE) IMPLANT
GLOVE BIOGEL PI INDICATOR 7.0 (GLOVE) ×3
GLOVE ECLIPSE 7.0 STRL STRAW (GLOVE) ×1 IMPLANT
GLOVE EXAM NITRILE MD LF STRL (GLOVE) ×1 IMPLANT
GLOVE SS BIOGEL STRL SZ 6.5 (GLOVE) IMPLANT
GLOVE SUPERSENSE BIOGEL SZ 6.5 (GLOVE) ×1
GOWN STRL REIN XL XLG (GOWN DISPOSABLE) ×7 IMPLANT
INST SET MINOR GENERAL (KITS) ×2 IMPLANT
KIT ROOM TURNOVER AP CYSTO (KITS) ×2 IMPLANT
MANIFOLD NEPTUNE II (INSTRUMENTS) ×2 IMPLANT
NS IRRIG 1000ML POUR BTL (IV SOLUTION) ×2 IMPLANT
PACK MINOR (CUSTOM PROCEDURE TRAY) ×2 IMPLANT
PAD ARMBOARD 7.5X6 YLW CONV (MISCELLANEOUS) ×2 IMPLANT
PAD TELFA 3X4 1S STER (GAUZE/BANDAGES/DRESSINGS) ×1 IMPLANT
SET BASIN LINEN APH (SET/KITS/TRAYS/PACK) ×2 IMPLANT
SOL PREP PROV IODINE SCRUB 4OZ (MISCELLANEOUS) ×2 IMPLANT
SPONGE GAUZE 4X4 12PLY (GAUZE/BANDAGES/DRESSINGS) ×2 IMPLANT
SPONGE INTESTINAL PEANUT (DISPOSABLE) ×2 IMPLANT
SPONGE LAP 18X18 X RAY DECT (DISPOSABLE) ×2 IMPLANT
SUPPORT SCROTAL LRG NO STRP (SOFTGOODS) IMPLANT
SUPPORT SCROTAL MEDIUM (SOFTGOODS) IMPLANT
SUPPORT SCROTAL XLRG NO STRP (MISCELLANEOUS) IMPLANT
SUT CHROMIC 4 0 PS 2 18 (SUTURE) IMPLANT
SUT PLAIN 2 0 XLH (SUTURE) ×2 IMPLANT
SUT SILK 0 FSL (SUTURE) ×1 IMPLANT
SUT VIC AB 2-0 CT1 27 (SUTURE) ×2
SUT VIC AB 2-0 CT1 TAPERPNT 27 (SUTURE) IMPLANT
SUT VIC AB 3-0 SH 27 (SUTURE) ×8
SUT VIC AB 3-0 SH 27X BRD (SUTURE) IMPLANT
SUT VICRYL 0 27 CT2 27 ABS (SUTURE) ×1 IMPLANT
SUT VICRYL AB #0 BRD 54IN (SUTURE) IMPLANT
SUT VICRYL AB 2 0 TIES (SUTURE) ×1 IMPLANT
SUT VICRYL AB 3 0 TIES (SUTURE) IMPLANT
SYR 20CC LL (SYRINGE) ×1 IMPLANT
SYR BULB IRRIGATION 50ML (SYRINGE) ×1 IMPLANT
SYR CONTROL 10ML LL (SYRINGE) ×2 IMPLANT
SYRINGE IRR TOOMEY STRL 70CC (SYRINGE) ×1 IMPLANT
TOWEL OR 17X26 4PK STRL BLUE (TOWEL DISPOSABLE) ×2 IMPLANT
YANKAUER SUCT BULB TIP NO VENT (SUCTIONS) ×1 IMPLANT

## 2012-12-20 NOTE — Transfer of Care (Addendum)
Immediate Anesthesia Transfer of Care Note  Patient: Nathaniel Hicks  Procedure(s) Performed: Procedure(s) (LRB): RIGHT RADICAL ORCHIECTOMY/POSSIBLE BX RIGHT TESTICLE (Right)  Patient Location: PACU  Anesthesia Type: SAB  Level of Consciousness: awake  Airway & Oxygen Therapy: Patient Spontanous Breathing and non-rebreather face mask  Post-op Assessment: Report given to PACU RN, Post -op Vital signs reviewed and stable. SAB Level  T 12  Post vital signs: Reviewed and stable  Complications: No apparent anesthesia complications

## 2012-12-20 NOTE — Anesthesia Postprocedure Evaluation (Addendum)
Anesthesia Post Note  Patient: Nathaniel Hicks  Procedure(s) Performed: Procedure(s) (LRB): RIGHT RADICAL ORCHIECTOMY/POSSIBLE BX RIGHT TESTICLE (Right)  Anesthesia type: Spinal  Patient location: PACU  Post pain: Pain level controlled  Post assessment: Post-op Vital signs reviewed, Patient's Cardiovascular Status Stable, Respiratory Function Stable, Patent Airway, No signs of Nausea or vomiting and Pain level controlled  Last Vitals:  Filed Vitals:   12/20/12 1022  BP: 110/53  Pulse: 57  Temp: 36.4 C  Resp: 10    Post vital signs: Reviewed and stable  Level of consciousness: awake and alert   Complications: No apparent anesthesia complications  12/21/12  Patient doing well.  VSS.  No apparent anesthesia complicationa.

## 2012-12-20 NOTE — Brief Op Note (Signed)
12/20/2012  10:19 AM  PATIENT:  Nathaniel Hicks  72 y.o. male  PRE-OPERATIVE DIAGNOSIS:  right testicular tumor  POST-OPERATIVE DIAGNOSIS:  right testicular tumor  PROCEDURE:  Procedure(s): RIGHT RADICAL ORCHIECTOMY/POSSIBLE BX RIGHT TESTICLE (Right)  SURGEON:  Surgeon(s) and Role:    * Ky Barban, MD - Primary  PHYSICIAN ASSISTANT:   ASSISTANTS: none   ANESTHESIA:   spinal  EBL:  Total I/O In: 1000 [I.V.:1000] Out: 10 [Blood:10]  BLOOD ADMINISTERED:none  DRAINS: none   LOCAL MEDICATIONS USED:  NONE  SPECIMEN:  Source of Specimen:  r testicular mass and spermatic cord frozen sections from r testicle.  DISPOSITION OF SPECIMEN:  PATHOLOGY  COUNTS:  YES  TOURNIQUET:  * No tourniquets in log *  DICTATION: .Other Dictation: Dictation Number dictation [id number]  PLAN OF CARE: Admit for overnight observation  PATIENT DISPOSITION:  PACU - hemodynamically stable.   Delay start of Pharmacological VTE agent (>24hrs) due to surgical blood loss or risk of bleeding:

## 2012-12-20 NOTE — Progress Notes (Signed)
After receiving pt. From OR, pt. Had 10 beat run of V-Tach.  Pt. Was in no acute distress & had no complications.  Pt. Has remained in sinus rhythm for remaining PACU admittance. Dr. Jayme Cloud aware.  Order for telemetry was obtained.  Will continue to monitor.

## 2012-12-20 NOTE — Progress Notes (Signed)
No change in H&P on repeat examination. 

## 2012-12-20 NOTE — H&P (Signed)
NAME:  DEMARIS, LEAVELL NO.:  [id number]  MEDICAL RECORD NO.:  [id number]  LOCATION:                                 FACILITY:  PHYSICIAN:  Ky Barban, M.D.DATE OF BIRTH:  12/20/2012  DATE OF ADMISSION:  12/20/2012 DATE OF DISCHARGE:  LH                             HISTORY & PHYSICAL   A 72 year old gentleman with chief complaint of his __________ testicular tumor.  A 72 year old gentleman who had repeat colonoscopy done by Dr. __________ who had noticed that there is a mass on the right testicle, so testicle ultrasound was ordered.  The report shows that the right testicle is completely replaced with a complex mass which is very suspicious for testicular tumor.  The patient was referred to me.  I ordered tumor markers and then beta-HCG, alpha-fetoprotein, and LDH. __________.  His alpha-fetoprotein is normal.  LDH is also normal, but beta-HCG is markedly elevated to 148.5.  So, __________ suspicion that he has __________.  I have advised him to undergo radical orchiectomy. I explained to him and his daughter that __________ remove the tumor. __________ pathological examination __________.  She wanted me to tell her preoperatively, but I told her that I cannot guarantee her as all the tests show it is very suspicious for a testicular tumor cancer, and that need to be removed.  I will go ahead and do frozen section, but I am going to do __________ go ahead and do radical orchiectomy through the groin.  I am going to put __________ level and then examine the testicle.  I may have to do a frozen section, but I will not do it if it is a very obvious tumor.  He has no other urological problems, so at this time, I am going to go ahead and proceed.  She told me she __________ family and then __________ as I am going to do radial orchiectomy and __________ frozen section tomorrow as outpatient.  There is no history of trauma.  __________ from the spermatic  cord.  PAST MEDICAL HISTORY: 1. History of hypertension. 2. Diabetes.  PERSONAL HISTORY:  He does not smoke or drink.  REVIEW OF SYSTEMS:  Unremarkable.  PHYSICAL EXAMINATION:  GENERAL:  Moderately built male, not in acute distress, fully conscious, alert, oriented. VITAL SIGNS:  Blood pressure 130/80.  Temperature is normal. ABDOMEN:  Soft, flat.  Liver, spleen, kidneys are not palpable.  There is scar in the right lower quadrant from previous __________.  He never had any surgery. EXTERNAL GENITALIA:  __________ circumcised.  __________ right testicle __________ and testicle is not palpable because of hard mass, nontender. There is no __________. RECTAL:  Prostate __________ smooth and firm.  No rectal mass. __________ reviewed.  There is a small complex mass in __________ right testicle.  It is __________ cm, possible right testicular retention. __________.  PLAN:  Most likely due to the tumor, he will need radical orchiectomy.     Ky Barban, M.D.     MIJ/MEDQ  D:  12/19/2012  T:  12/20/2012  Job:  [id number]  cc:   Melvyn Novas, MD Fax: [phone]

## 2012-12-20 NOTE — Anesthesia Preprocedure Evaluation (Signed)
Anesthesia Evaluation  Patient identified by MRN, date of birth, ID band Patient awake    Reviewed: Allergy & Precautions, H&P , NPO status , Patient's Chart, lab work & pertinent test results  Airway Mallampati: II      Dental  (+) Teeth Intact   Pulmonary Current Smoker,  breath sounds clear to auscultation        Cardiovascular hypertension, Pt. on medications Rhythm:Regular Rate:Normal     Neuro/Psych    GI/Hepatic negative GI ROS, (+)     substance abuse  alcohol use,   Endo/Other    Renal/GU      Musculoskeletal   Abdominal   Peds  Hematology   Anesthesia Other Findings   Reproductive/Obstetrics                           Anesthesia Physical Anesthesia Plan  ASA: II  Anesthesia Plan: Spinal   Post-op Pain Management:    Induction:   Airway Management Planned: Nasal Cannula  Additional Equipment:   Intra-op Plan:   Post-operative Plan:   Informed Consent: I have reviewed the patients History and Physical, chart, labs and discussed the procedure including the risks, benefits and alternatives for the proposed anesthesia with the patient or authorized representative who has indicated his/her understanding and acceptance.     Plan Discussed with:   Anesthesia Plan Comments:         Anesthesia Quick Evaluation

## 2012-12-20 NOTE — Anesthesia Procedure Notes (Signed)
Procedure Name: MAC Date/Time: 12/20/2012 7:52 AM Performed by: Franco Nones Pre-anesthesia Checklist: Patient identified, Emergency Drugs available, Suction available, Timeout performed and Patient being monitored Patient Re-evaluated:Patient Re-evaluated prior to inductionOxygen Delivery Method: Nasal Cannula    Spinal  Patient location during procedure: OR Start time: 12/20/2012 7:59 AM End time: 12/20/2012 8:04 AM Staffing CRNA/Resident: Minerva Areola S Preanesthetic Checklist Completed: patient identified, site marked, surgical consent, pre-op evaluation, timeout performed, IV checked, risks and benefits discussed and monitors and equipment checked Spinal Block Patient position: right lateral decubitus Prep: Betadine Patient monitoring: heart rate, cardiac monitor, continuous pulse ox and blood pressure Approach: right paramedian Location: L3-4 Injection technique: single-shot Needle Needle type: Spinocan  Needle gauge: 22 G Needle length: 9 cm Assessment Sensory level: T8 Additional Notes Betadine prep x 3 1% lidocaine skin wheal 1 cc Clear CSF pre and post injection   ATTEMPTS: 1 TRAY ID: [id number] TRAY EXPIRATION DATE: 2014-12

## 2012-12-21 LAB — BASIC METABOLIC PANEL
BUN: 4 mg/dL — ABNORMAL LOW (ref 6–23)
Calcium: 9 mg/dL (ref 8.4–10.5)
Chloride: 105 mEq/L (ref 96–112)
Creatinine, Ser: 0.63 mg/dL (ref 0.50–1.35)
GFR calc Af Amer: >90 mL/min (ref >90)
Glucose, Bld: 110 mg/dL — ABNORMAL HIGH (ref 70–99)
Potassium: 3.3 mEq/L — ABNORMAL LOW (ref 3.5–5.1)

## 2012-12-21 LAB — CBC
HCT: 37.7 % — ABNORMAL LOW (ref 39.0–52.0)
Hemoglobin: 12.8 g/dL — ABNORMAL LOW (ref 13.0–17.0)
MCHC: 34 g/dL (ref 30.0–36.0)
MCV: 100.8 fL — ABNORMAL HIGH (ref 78.0–100.0)
Platelets: 186 10*3/uL (ref 150–400)

## 2012-12-21 MED ORDER — OXYCODONE-ACETAMINOPHEN 7.5-325 MG PO TABS: 1.0000 | ORAL_TABLET | Freq: Four times a day (QID) | ORAL | Status: DC | PRN

## 2012-12-21 NOTE — Progress Notes (Signed)
UR chart review completed.  

## 2012-12-21 NOTE — Progress Notes (Signed)
12/21/12 1729 foley catheter discontinued this evening as ordered per Dr. Jerre Simon. Urinal provided for patient use, instructed to notify nursing staff when he had voided so urine output could be assessed/measured. Pt stated will notify nursing staff. Earnstine Regal, RN

## 2012-12-21 NOTE — Plan of Care (Signed)
Problem: Phase I Progression Outcomes Goal: Voiding-avoid urinary catheter unless indicated Outcome: Completed/Met Date Met:  12/21/12 12/21/12 1937 patient voided independently prior to discharge. Dr Jerre Simon aware, stated stable for discharge home. Earnstine Regal, RN

## 2012-12-21 NOTE — Progress Notes (Signed)
12/21/12 1939 reviewed discharge instructions with patient, sister at bedside this evening. Given copy of instructions, medication list, prescription, f/u appointment as scheduled. Reviewed when to call MD, incision care, keeping incision area clean and dry. MD to address when to remove staples on f/u appointment. Patient and sister verbalize understanding of discharge instructions. Noted when home medications next due on list. IV site d/c'd, within normal limits. No c/o pain at time of discharge.  Pt left floor in stable condition via w/c accompanied by nurse tech. Earnstine Regal, RN

## 2012-12-21 NOTE — Progress Notes (Signed)
12/21/12 1536 Patient and his sister expressed concerns regarding discharge plan and surgery. Requested to speak with Dr. Jerre Simon. Attempted to reach Dr. Jerre Simon to notify. Awaiting MD rounds. Earnstine Regal, RN

## 2012-12-21 NOTE — Care Management Note (Signed)
    Page 1 of 1   12/21/2012     3:28:56 PM   CARE MANAGEMENT NOTE 12/21/2012  Patient:  KAMSIYOCHUKWU, SPICKLER A   Account Number:  [id number]  Date Initiated:  12/21/2012  Documentation initiated by:  Sharrie Rothman  Subjective/Objective Assessment:   Pt admitted from home s/p right radical orchiectomy.Pt lives alone but will be staying with sister for a few days at discharge. Pt is fairly independent with ADL's.     Action/Plan:   Pt would like HH RN if discharges with foley. Will arrange with pts choice of agency once determination is made.   Anticipated DC Date:  12/21/2012   Anticipated DC Plan:  HOME/SELF CARE      DC Planning Services  CM consult      Choice offered to / List presented to:             Status of service:  Completed, signed off Medicare Important Message given?   (If response is "NO", the following Medicare IM given date fields will be blank) Date Medicare IM given:   Date Additional Medicare IM given:    Discharge Disposition:  HOME/SELF CARE  Per UR Regulation:    If discussed at Long Length of Stay Meetings, dates discussed:    Comments:  12/21/12 1530 Arlyss Queen, RN BSN CM

## 2012-12-21 NOTE — Progress Notes (Signed)
12/21/12 1610 Received call back from Dr. Jerre Simon regarding patient condition. States will see patient this evening. Probable discharge home.  Earnstine Regal, RN

## 2012-12-21 NOTE — Progress Notes (Signed)
Afebrile wound looks fine had difficulty to void last night foley dc voided small amount since then no complaints no ho prostatis he was taking flomax will dc home i have told his family if any voding difficuty to bring back to er .iwll see him in office on Wednesday 10am to remove stitches .

## 2012-12-22 ENCOUNTER — Encounter (HOSPITAL_COMMUNITY): Payer: Self-pay | Admitting: Urology

## 2012-12-22 NOTE — Op Note (Signed)
NAME:  Nathaniel Hicks, Nathaniel Hicks NO.:  [id number]  MEDICAL RECORD NO.:  [id number]  LOCATION:                                 FACILITY:  PHYSICIAN:  Ky Barban, M.D.DATE OF BIRTH:  November 09, 1940  DATE OF PROCEDURE:  12/20/2012 DATE OF DISCHARGE:                              OPERATIVE REPORT   PREOPERATIVE DIAGNOSIS:  Right testicular cancer.  POSTOPERATIVE DIAGNOSIS:  Right testicular cancer.  PROCEDURE:  Right radical orchiectomy and frozen sections.  ANESTHESIA:  Spinal.  PROCEDURE IN DETAIL:  The patient under spinal anesthesia, after usual prep and drape, an incision was made in the right groin starting from the anterior superior iliac spine ending at the level of the superficial ring, carried down through the subcutaneous tissue, which was divided and the aponeurosis of the external oblique was exposed.  It was opened up in the line of the incision.  The margins of the aponeurosis were held with the help of Kocher clamps, and spermatic cord was identified, and it was at the level of the pubic tubercle.  I was able to isolate the spermatic cord, passed a Penrose drain around it.  Then, the spermatic cord along with the remaining tissue in the inguinal canal was lifted up to the level of the deep inguinal ring.  At that point, at the level of the deep inguinal ring, I applied a rubber shod clamp and then proceeded to work on the scrotal mass, which was pushed into the incision by pushing the scrotum.  Large testicular mass was pushed into the incisional area and the attachment to the skins were separated very carefully with blunt dissection.  The testicular mass was isolated completely, separated from the scrotal inside wall and some of the bleeders were ligated inside the scrotum, some were fulgurated.  The testicle cannot be identified, but there is one area which I thought is the testicle.  Biopsy was taken from there.  It came back benign testicular  tissue.  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  The rest of the tumor mass is irregular and hard. The rubber shod clamp at that level, the vas deferens was palpated.  The right angle was passed around the vas deferens and it was clamped and divided between 2 stitches.  Then, the spermatic caudad to the level of the deep inguinal ring was ligated with 0 Vicryl tie and 0 Vicryl stitch and ligated and divided.  Between 2 hemostats were divided at the level of the deep inguinal ring, and the specimen was completely removed along with the spermatic cord.  Then, I went back and put another stitch in the stump of the spermatic cord at the level of the deep inguinal ring with the help of 3-0 Vicryl stitch and ligated without any difficulty. The wound was irrigated with saline.  After that, the aponeurosis of the external oblique was closed with a running stitch of 0 Vicryl.  There is no nerve that I could identify or seen in this area.  There is a complete hemostasis.  Skin  is intact.  The scrotal skin is intact.  Then the __________ tissue was approximated with 3-0 plain catgut.  Skin was closed with staples.  The wound was covered with Neosporin ointment and sterile gauze dressing applied.  The patient left the operating room in satisfactory condition.  There was hardly any blood loss.  I had closed the biopsy site with 3-0 Vicryl running stitch on both areas.  At this point, the patient was taken to the recovery room in satisfactory condition.     Ky Barban, M.D.     MIJ/MEDQ  D:  12/20/2012  T:  12/21/2012  Job:  [id number]

## 2013-01-12 NOTE — Discharge Summary (Signed)
NAME:  Nathaniel Hicks, Nathaniel Hicks NO.:  [id number]  MEDICAL RECORD NO.:  [id number]  LOCATION:                                 FACILITY:  PHYSICIAN:  Marissa Nestle, M.D.DATE OF BIRTH:  09-Sep-1940  DATE OF ADMISSION:  12/20/2012 DATE OF DISCHARGE:  12/10/2014LH                              DISCHARGE SUMMARY   This is a 73 year old gentleman was found to have testicular mass during routine check up by Dr. Laural Golden and he referred him to his family physician who ordered a testicular ultrasound.  The report shows the right testicle is completely replaced with complex mass which is very suspicious of testicular tumor.  There is no history of trauma, fever, or pain.  I examined the patient in the office and ordered tumor markers.  His beta hCG, alpha-fetoprotein and LDH were done.  His alpha- fetoprotein is normal.  LDH is also normal but beta hCG is markedly elevated, it is 248.5, so very suspicious testicular tumor, most likely seminoma.  So, I advised them, he should undergo radical orchiectomy through the groin.  Routine preadmission workup was done.  CBC, urinalysis are normal.  His past medical history include hypertension, non-insulin dependent diabetes.  He was brought as outpatient in the operating room where the right testicle was explored with rubber shod clamp on the spermatic cord at the level of the deep inguinal ring.  The testicle was inspected and biopsy was done.  The initial first biopsy showed normal testicular tissue.  A second biopsy was repeated and it showed that there is seminoma.  So, I went ahead and did a radical orchiectomy.  Postoperative course was benign, first postoperative day. His wound was healing up nicely.  He was afebrile.  His pathology report was pending.  So, I decided to send him home, and his Foley catheter was taken out.  He was voiding satisfactorily.  I will see him back in the office in 1 week.  He is being discharged.  FINAL  DISCHARGE DIAGNOSIS:  Right testicular seminoma.  DISCHARGE CONDITION:  Improved.  DISCHARGE MEDICATIONS:  Percocet 1 q.6 h. p.r.n. #30.  He is also taking tamsulosin, so he will be discharged home with tamsulosin.     Marissa Nestle, M.D.     MIJ/MEDQ  D:  01/10/2013  T:  01/11/2013  Job:  [id number]

## 2013-01-16 ENCOUNTER — Encounter (HOSPITAL_COMMUNITY): Payer: Medicare HMO

## 2013-01-18 ENCOUNTER — Encounter (HOSPITAL_COMMUNITY)
Admission: RE | Admit: 2013-01-18 | Discharge: 2013-01-18 | Disposition: A | Discharge status: Home / Self Care | Payer: Medicare HMO | Source: Ambulatory Visit | Attending: Hematology and Oncology | Admitting: Hematology and Oncology

## 2013-01-18 ENCOUNTER — Encounter (HOSPITAL_COMMUNITY): Payer: Self-pay

## 2013-01-18 DIAGNOSIS — Z9079 Acquired absence of other genital organ(s): Secondary | ICD-10-CM | POA: Insufficient documentation

## 2013-01-18 DIAGNOSIS — C6290 Malignant neoplasm of unspecified testis, unspecified whether descended or undescended: Secondary | ICD-10-CM | POA: Insufficient documentation

## 2013-01-18 DIAGNOSIS — I700 Atherosclerosis of aorta: Secondary | ICD-10-CM | POA: Insufficient documentation

## 2013-01-18 DIAGNOSIS — I2510 Atherosclerotic heart disease of native coronary artery without angina pectoris: Secondary | ICD-10-CM | POA: Insufficient documentation

## 2013-01-18 LAB — GLUCOSE, CAPILLARY: Glucose-Capillary: 82 mg/dL (ref 70–99)

## 2013-01-18 MED ORDER — FLUDEOXYGLUCOSE F - 18 (FDG) INJECTION
18.0000 | Freq: Once | INTRAVENOUS | Status: AC | PRN
  Administered 2013-01-18: 18 via INTRAVENOUS

## 2013-01-19 ENCOUNTER — Encounter (HOSPITAL_COMMUNITY): Payer: Self-pay

## 2013-01-19 ENCOUNTER — Encounter (HOSPITAL_COMMUNITY): Payer: Medicare HMO | Attending: Hematology and Oncology

## 2013-01-19 ENCOUNTER — Encounter (HOSPITAL_COMMUNITY): Payer: Medicare HMO

## 2013-01-19 VITALS — BP 150/68 | HR 63 | Temp 97.5°F | Resp 18 | Wt 158.5 lb

## 2013-01-19 DIAGNOSIS — Z09 Encounter for follow-up examination after completed treatment for conditions other than malignant neoplasm: Secondary | ICD-10-CM | POA: Diagnosis present

## 2013-01-19 DIAGNOSIS — Z8547 Personal history of malignant neoplasm of testis: Secondary | ICD-10-CM | POA: Diagnosis not present

## 2013-01-19 DIAGNOSIS — C6290 Malignant neoplasm of unspecified testis, unspecified whether descended or undescended: Secondary | ICD-10-CM

## 2013-01-19 DIAGNOSIS — I10 Essential (primary) hypertension: Secondary | ICD-10-CM | POA: Diagnosis not present

## 2013-01-19 DIAGNOSIS — N400 Enlarged prostate without lower urinary tract symptoms: Secondary | ICD-10-CM | POA: Diagnosis not present

## 2013-01-19 LAB — COMPREHENSIVE METABOLIC PANEL
ALK PHOS: 85 U/L (ref 39–117)
ALT: 10 U/L (ref 0–53)
AST: 18 U/L (ref 0–37)
Albumin: 4 g/dL (ref 3.5–5.2)
BUN: 11 mg/dL (ref 6–23)
CALCIUM: 10.2 mg/dL (ref 8.4–10.5)
CO2: 25 mEq/L (ref 19–32)
Chloride: 100 mEq/L (ref 96–112)
Creatinine, Ser: 0.6 mg/dL (ref 0.50–1.35)
GFR calc Af Amer: >90 mL/min (ref >90)
GFR calc non Af Amer: >90 mL/min (ref >90)
Glucose, Bld: 98 mg/dL (ref 70–99)
POTASSIUM: 3.7 meq/L (ref 3.7–5.3)
Sodium: 138 mEq/L (ref 137–147)
TOTAL PROTEIN: 7.8 g/dL (ref 6.0–8.3)
Total Bilirubin: 0.7 mg/dL (ref 0.3–1.2)

## 2013-01-19 LAB — CBC WITH DIFFERENTIAL/PLATELET
Basophils Absolute: 0 10*3/uL (ref 0.0–0.1)
Basophils Relative: 1 % (ref 0–1)
EOS PCT: 4 % (ref 0–5)
Eosinophils Absolute: 0.2 10*3/uL (ref 0.0–0.7)
HCT: 42.7 % (ref 39.0–52.0)
HEMOGLOBIN: 14.3 g/dL (ref 13.0–17.0)
LYMPHS ABS: 0.9 10*3/uL (ref 0.7–4.0)
Lymphocytes Relative: 20 % (ref 12–46)
MCH: 34.6 pg — AB (ref 26.0–34.0)
MCHC: 33.5 g/dL (ref 30.0–36.0)
MCV: 103.4 fL — AB (ref 78.0–100.0)
MONOS PCT: 11 % (ref 3–12)
Monocytes Absolute: 0.5 10*3/uL (ref 0.1–1.0)
Neutro Abs: 2.9 10*3/uL (ref 1.7–7.7)
Neutrophils Relative %: 65 % (ref 43–77)
Platelets: 215 10*3/uL (ref 150–400)
RBC: 4.13 MIL/uL — AB (ref 4.22–5.81)
RDW: 16.3 % — ABNORMAL HIGH (ref 11.5–15.5)
WBC: 4.4 10*3/uL (ref 4.0–10.5)

## 2013-01-19 LAB — LACTATE DEHYDROGENASE: LDH: 262 U/L — ABNORMAL HIGH (ref 94–250)

## 2013-01-19 NOTE — Patient Instructions (Signed)
[facility] Discharge Instructions  RECOMMENDATIONS MADE BY THE CONSULTANT AND ANY TEST RESULTS WILL BE SENT TO YOUR REFERRING PHYSICIAN.  EXAM FINDINGS BY THE PHYSICIAN TODAY AND SIGNS OR SYMPTOMS TO REPORT TO CLINIC OR PRIMARY PHYSICIAN: Exam and findings as discussed by Dr. Barnet Glasgow. Options are: Active Surveillance, Adjuvant Chemotherapy, Adjuvant Radiation Therapy.  Recommendation and accepted plan is for active surveillance with CT scans in 3 , 6 and 12 months the first year then every 6 months for 2 years then every year for 2 years.  MEDICATIONS PRESCRIBED:  none  INSTRUCTIONS/FOLLOW-UP: Follow-up in 3 months after labs and scans.  Thank you for choosing [facility] to provide your oncology and hematology care.  To afford each patient quality time with our providers, please arrive at least 15 minutes before your scheduled appointment time.  With your help, our goal is to use those 15 minutes to complete the necessary work-up to ensure our physicians have the information they need to help with your evaluation and healthcare recommendations.    Effective January 1st, 2014, we ask that you re-schedule your appointment with our physicians should you arrive 10 or more minutes late for your appointment.  We strive to give you quality time with our providers, and arriving late affects you and other patients whose appointments are after yours.    Again, thank you for choosing [facility].  Our hope is that these requests will decrease the amount of time that you wait before being seen by our physicians.       _____________________________________________________________  Should you have questions after your visit to [facility], please contact our office at [phone] between the hours of 8:30 a.m. and 5:00 p.m.  Voicemails left after 4:30 p.m. will not be returned until the following business day.  For prescription refill  requests, have your pharmacy contact our office with your prescription refill request.

## 2013-01-19 NOTE — Progress Notes (Signed)
[place] A. Barnet Glasgow, M.D.  NEW PATIENT EVALUATION   Name: Nathaniel Hicks Date: 01/19/2013 MRN: [id number] DOB: 1940-07-11  PCP: [name], MD   REFERRING PHYSICIAN: [name], MD  REASON FOR REFERRAL: Seminoma     HISTORY OF PRESENT ILLNESS:Derelle A Cravens is a 73 y.o. 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 He offers no new complaints postop. He had noticed a mass in his right testis for long time probably more than 6 months with gradual enlargement. He was having pain in that region and was evaluated by gastroenterology who noticed a mass. His PCP then referred him for urologic consultation and after ultrasound was performed, right radical orchiectomy was performed on 12/21/2012 with findings of a pure seminoma with lymphovascular invasion, stage I B. with primary tumor 7.5 cm in size. He denies any worse cough, shortness of breath, bone pain, abdominal pain, back pain, lower extremity swelling or redness, skin rash, fever, night sweats, breast enlargement, joint pain, headache, or seizures.   PAST MEDICAL HISTORY:  has a past medical history of Hypertension; BPH (benign prostatic hyperplasia); HOH (hard of hearing); Arthritis; and Cancer.     PAST SURGICAL HISTORY: Past Surgical History  Procedure Laterality Date  . Scalp laceration repair      [facility]-Dr Tamala Julian  . Hemorroidectomy    . Lesion excision N/A 03/23/2012    Procedure: EXCISION NEOPLASM SCALP ;  Surgeon: Jamesetta So, MD;  Location: [facility] ORS;  Service: General;  Laterality: N/A;  Excision of Scalp Neoplasm  . Colonoscopy N/A 11/24/2012    Procedure: COLONOSCOPY;  Surgeon: Rogene Houston, MD;  Location: [facility] END[facility] SUITE;  Service: Endoscopy;  Laterality: N/A;  830-moved to [place] notified pt  . Orchiectomy Right 12/20/2012    Procedure: RIGHT RADICAL ORCHIECTOMY/POSSIBLE BX RIGHT  TESTICLE;  Surgeon: Marissa Nestle, MD;  Location: [facility] ORS;  Service: Urology;  Laterality: Right;     CURRENT MEDICATIONS: has a current medication list which includes the following prescription(s): amlodipine-benazepril, naproxen, tamsulosin, tramadol, and oxycodone-acetaminophen.   ALLERGIES: Review of patient's allergies indicates no known allergies.   SOCIAL HISTORY:  reports that he has been smoking Cigarettes.  He has a 25 pack-year smoking history. He does not have any smokeless tobacco history on file. He reports that he drinks about 1.8 ounces of alcohol per week. He reports that he does not use illicit drugs.   FAMILY HISTORY: family history is not on file.    REVIEW OF SYSTEMS:  Other than that discussed above is noncontributory.    PHYSICAL EXAM:  weight is 158 lb 8 oz (71.895 kg). His oral temperature is 97.5 F (36.4 C). His blood pressure is 150/68 and his pulse is 63. His respiration is 18.    GENERAL:alert, no distress and comfortable SKIN: skin color, texture, turgor are normal, no rashes or significant lesions EYES: normal, Conjunctiva are pink and non-injected, sclera clear OROPHARYNX:no exudate, no erythema and lips, buccal mucosa, and tongue normal  NECK: supple, thyroid normal size, non-tender, without nodularity CHEST: Increased AP diameter with no gynecomastia. LYMPH:  no palpable lymphadenopathy in the cervical, axillary or inguinal LUNGS: clear to auscultation and percussion with normal breathing effort HEART: regular rate & rhythm and no murmurs ABDOMEN:abdomen soft, non-tender and normal bowel sounds MUSCULOSKELETALl:no cyanosis of digits, no clubbing or edema  NEURO: alert & oriented  x 3 with fluent speech, no focal motor/sensory deficits GENITALIA: Absent right testis. Left testis without mass.    LABORATORY DATA:  Office Visit on 01/19/2013  Component Date Value Range Status  . WBC 01/19/2013 4.4  4.0 - 10.5 K/uL Final  . RBC 01/19/2013  4.13* 4.22 - 5.81 MIL/uL Final  . Hemoglobin 01/19/2013 14.3  13.0 - 17.0 g/dL Final  . HCT 78/01/808 42.7  39.0 - 52.0 % Final  . MCV 01/19/2013 103.4* 78.0 - 100.0 fL Final  . MCH 01/19/2013 34.6* 26.0 - 34.0 pg Final  . MCHC 01/19/2013 33.5  30.0 - 36.0 g/dL Final  . RDW 65/39/9085 16.3* 11.5 - 15.5 % Final  . Platelets 01/19/2013 215  150 - 400 K/uL Final  . Neutrophils Relative % 01/19/2013 65  43 - 77 % Final  . Neutro Abs 01/19/2013 2.9  1.7 - 7.7 K/uL Final  . Lymphocytes Relative 01/19/2013 20  12 - 46 % Final  . Lymphs Abs 01/19/2013 0.9  0.7 - 4.0 K/uL Final  . Monocytes Relative 01/19/2013 11  3 - 12 % Final  . Monocytes Absolute 01/19/2013 0.5  0.1 - 1.0 K/uL Final  . Eosinophils Relative 01/19/2013 4  0 - 5 % Final  . Eosinophils Absolute 01/19/2013 0.2  0.0 - 0.7 K/uL Final  . Basophils Relative 01/19/2013 1  0 - 1 % Final  . Basophils Absolute 01/19/2013 0.0  0.0 - 0.1 K/uL Final  Hospital Outpatient Visit on 01/18/2013  Component Date Value Range Status  . Glucose-Capillary 01/18/2013 82  70 - 99 mg/dL Final  Admission on 20/50/5091, Discharged on 12/21/2012  Component Date Value Range Status  . WBC 12/21/2012 5.4  4.0 - 10.5 K/uL Final  . RBC 12/21/2012 3.74* 4.22 - 5.81 MIL/uL Final  . Hemoglobin 12/21/2012 12.8* 13.0 - 17.0 g/dL Final  . HCT 85/99/5667 37.7* 39.0 - 52.0 % Final  . MCV 12/21/2012 100.8* 78.0 - 100.0 fL Final  . MCH 12/21/2012 34.2* 26.0 - 34.0 pg Final  . MCHC 12/21/2012 34.0  30.0 - 36.0 g/dL Final  . RDW 17/79/5646 14.8  11.5 - 15.5 % Final  . Platelets 12/21/2012 186  150 - 400 K/uL Final  . Sodium 12/21/2012 139  135 - 145 mEq/L Final  . Potassium 12/21/2012 3.3* 3.5 - 5.1 mEq/L Final  . Chloride 12/21/2012 105  96 - 112 mEq/L Final  . CO2 12/21/2012 25  19 - 32 mEq/L Final  . Glucose, Bld 12/21/2012 110* 70 - 99 mg/dL Final  . BUN 29/00/9446 4* 6 - 23 mg/dL Final  . Creatinine, Ser 12/21/2012 0.63  0.50 - 1.35 mg/dL Final  . Calcium  15/58/2833 9.0  8.4 - 10.5 mg/dL Final  . GFR calc non Af Amer 12/21/2012 >90  >90 mL/min Final  . GFR calc Af Amer 12/21/2012 >90  >90 mL/min Final   Comment: (NOTE)                          The eGFR has been calculated using the CKD EPI equation.                          This calculation has not been validated in all clinical situations.                          eGFR's persistently <90 mL/min signify possible Chronic Kidney  Disease.  Marland Kitchen MRSA by PCR 12/21/2012 NEGATIVE  NEGATIVE Final   Comment:                                 The GeneXpert MRSA Assay (FDA                          approved for NASAL specimens                          only), is one component of a                          comprehensive MRSA colonization                          surveillance program. It is not                          intended to diagnose MRSA                          infection nor to guide or                          monitor treatment for                          MRSA infections.    Urinalysis No results found for this basename: colorurine,  appearanceur,  labspec,  phurine,  glucoseu,  hgbur,  bilirubinur,  ketonesur,  proteinur,  urobilinogen,  nitrite,  leukocytesur      '@RADIOGRAPHY'$ : Nm Pet Image Initial (pi) Skull Base To Thigh  01/18/2013   CLINICAL DATA:  Initial treatment strategy for right testicular cancer (seminoma).  EXAM: NUCLEAR MEDICINE PET SKULL BASE TO THIGH  FASTING BLOOD GLUCOSE:  Value:  82 mg/dl  TECHNIQUE: 18.0 mCi F-18 FDG was injected intravenously. CT data was obtained and used for attenuation correction and anatomic localization only. (This was not acquired as a diagnostic CT examination.) Additional exam technical data entered on technologist worksheet.  COMPARISON:  None.  FINDINGS: NECK  No hypermetabolic lymph nodes in the neck.  CHEST  No suspicious pulmonary nodules on the CT scan. Mild dependent atelectasis in the right lower lobe.  No hypermetabolic  mediastinal or hilar nodes.  Heart is normal in size.  Coronary atherosclerosis.  ABDOMEN/PELVIS  No abnormal hypermetabolic activity within the liver, pancreas, adrenal glands, or spleen.  Small para-aortic nodes measuring up to 5 mm short axis (series 2/image 149), without convincing hypermetabolism.  Postsurgical changes related to prior right orchiectomy with associated mild hypermetabolism.  Atherosclerotic calcifications of the abdominal aorta and branch vessels.  SKELETON  No focal hypermetabolic activity to suggest skeletal metastasis.  IMPRESSION: Status post right orchiectomy.  No findings specific for metastatic disease.  Small para-aortic nodes measuring up to 5 mm short axis, without convincing hypermetabolism. Given location, attention on follow-up is suggested.   Electronically Signed   By: Julian Hy M.D.   On: 01/18/2013 14:38    PATHOLOGY: FINAL for JONATHANDAVID, MARLETT ([id number]) Patient: LEVERNE, AMRHEIN Collected: 12/20/2012 Client: [facility] Accession: [id number] Received: 12/20/2012 Ricki Miller DOB: April 08, 1940 Age: 45 Gender:  M Reported: 12/22/2012 [address] Patient Ph: [phone] MRN #: [id number] Linna [place] [id number] Visit #: [id number] Chart #: Phone: [phone] Fax: CC: REPORT OF SURGICAL PATHOLOGY FINAL DIAGNOSIS Diagnosis 1. Testis, biopsy, right - FIBROTIC TISSUE WITH ASSOCIATED CHRONIC INFLAMMATION, NO EVIDENCE OF MALIGNANCY. 2. Testis, biopsy, right - SEMINOMA. PLEASE SEE COMMENT. 3. Testis, tumor, right - SEMINOMA, 7.5 CM. - ANGIOLYMPHATIC INVASION PRESENT. - RESECTION MARGINS, NEGATIVE FOR ATYPIA OR MALIGNANCY. Microscopic Comment 3. ONCOLOGY TABLE - TESTIS 1. Specimen and laterality: Right testis 2. Tumor focality: Unifocal 3. Macroscopic extent of tumor: Tumor extension is limited to testis and epididymis 4. Maximum tumor size (cm): 7.5 cm gross measurement 5. Histologic type: Seminoma with tumor necrosis 6. Microscopic tumor  extension: Limited to testis 7. Spermatic cord and surgical margins: Negative 8. Lymph-Vascular invasion: Present 9. Intratubular germ cell neoplasia: N/A 10. Lymph nodes: # examined: N/A; # positive: N/A 11. TNM code: pT2, pNX 12. Serum tumor markers: See patient's medical record 13. Comment: The biopsy shows neoplastic cells with prominent nucleoli and clear cytoplasm in a background of extensive tumor necrosis. Angiolymphatic invasion is also present. Immunohistochemical stains were performed and the tumor cells are strongly positive for PLAP, C-Kit, negative for AFP, CD30 and very focally positive for beta HCG with appropriate controls. The overall findings are diagnostic for seminoma. Clinical correlation is highly recommended. (HCL:caf 12/22/12) Aldona Bar MD Pathologist, Electronic Signature (Case signed 12/22/2012) 1 of 2 FINAL for PATTERSON, HOLLENBAUGH A [phone]) Intraoperative Diagnosis 1. RAPID INTRAOPERATIVE CONSULT, RIGHT TESTICLE BIOPSY, FROZEN SECTION DIAGNOSIS. BENIGN. (JBK) 2. RAPID INTRAOPERATIVE CONSULT, RIGHT TESTICLE BIOPSY, FROZEN SECTION DIAGNOSIS. SEMINOMA. (JBK) Specimen Gross and Clinical Information Specimen(s) Obtained: 1. Testis, biopsy, right 2. Testis, biopsy, right 3. Testis, tumor, right Specimen Clinical Information 2. right testicular tumor 3. right testicular tumor Gross 1. Received fresh for rapid intraoperative consult is a 1 x 0.5 x 0.2 cm portion of soft tan white tissue which is entirely submitted for frozen section in one block. 2. Received fresh for rapid intraoperative consult is a 1.5 x 0.2 x 0.3 cm portion of tan white tissue which is bisected and entirely submitted for frozen section in one block. 3. Received in formalin is a testicle with attached segment of spermatic cord, clinically right testicle. The specimen weighs 130 grams. The testicle measures 7.5 x 6 x 4.8 cm and the segment of spermatic cord measures 11 cm in length. The  tunica vaginalis has been previously incised and the tunica albuginea shows two sutured incisions, each 2 cm in greatest dimension. The cut surface of the testicle shows near complete replacement by nodular tumor. The cut surface of the tumor is tan white with areas of yellow discoloration and hemorrhage consistent with necrosis. The tumor is grossly confined to the testicle. The cut surface of the spermatic cord is unremarkable. Sections are submitted in six cassettes. A = spermatic cord margin B - F = tumor (GP:caf 12/20/12) Stain(s) used in Diagnosis: The following stain(s) were used in diagnosing the case: Alpha-Feto Protein, CD117 (C-KIT), Placental Alkaline Phosphatase, Beta Human Chorionic Gonadotrophin, CD 30. The control(s) stained appropriately. Disclaimer Some of these immunohistochemical stains may have been developed and the performance characteristics determined by [facility]. Some may not have been cleared or approved by the U.S. Food and Drug Administration. The FDA has determined that such clearance or approval is not necessary. This test is used for clinical purposes. It should not be regarded as investigational or for research. This laboratory  is certified under the [place] (CLIA-88) as qualified to perform high complexity clinical laboratory testing. Report signed out from the following location(s) Technical Component performed at [place], [place] [address] CLIA: [id number]., Technical Component performed at [place], [place], [place] [address]. CLIA #: [id number], Interpretation performed at [place], [place], [place] [address]. CLIA #: [id number],   IMPRESSION:  #1. Stage I-B seminoma, status post right radical orchiectomy, normal PET scan, 4 tumor marker studies today including LDH, beta hCG, and  alpha-fetoprotein. #2. Degenerative joint disease. #3. Hypertension, on treatment.   PLAN:  #1. In the presence of his sister who accompanied him today, 3 options were given for postop treatment: #1. Active surveillance relapse rate of 20%                    #2. Adjuvant chemotherapy with carboplatin for 2 cycles  relapse rate of 1.8%                    #3. Adjuvant radiation therapy relapse rate 4%. All these treatments had no benefit in overall survival because salvage chemotherapy at the time of relapse results and 100% cure rate. #2. After discussion, the patient and his sister decided to go with active surveillance which requires judicious adherence to CAT scan followup at 3, 6, and 12 months during the first year; every 6 months for the next 2 years, and then every year for the last 2 years. #3. Followup in April 2015 just prior to which first surveillance CAT scans will be done.  I appreciate the opportunity sharing in his care.   Doroteo Bradford, MD 01/19/2013 3:22 PM

## 2013-01-19 NOTE — Progress Notes (Signed)
Nathaniel Hicks presented for [employer]. Labs per MD order drawn via Peripheral Line 23 gauge needle inserted in right AC  Good blood return present. Procedure without incident.  Needle removed intact. Patient tolerated procedure well.

## 2013-01-20 LAB — CEA: CEA: 2.2 ng/mL (ref 0.0–5.0)

## 2013-01-20 LAB — AFP TUMOR MARKER: AFP-Tumor Marker: 1.4 ng/mL (ref 0.0–8.0)

## 2013-01-22 LAB — BETA HCG QUANT (REF LAB)

## 2013-01-23 ENCOUNTER — Other Ambulatory Visit (HOSPITAL_COMMUNITY): Payer: Medicare HMO

## 2013-04-17 ENCOUNTER — Encounter (HOSPITAL_COMMUNITY): Payer: Medicare HMO | Attending: Hematology and Oncology

## 2013-04-17 ENCOUNTER — Ambulatory Visit (HOSPITAL_COMMUNITY)
Admission: RE | Admit: 2013-04-17 | Discharge: 2013-04-17 | Disposition: A | Discharge status: Home / Self Care | Payer: Medicare HMO | Source: Ambulatory Visit | Attending: Hematology and Oncology | Admitting: Hematology and Oncology

## 2013-04-17 ENCOUNTER — Encounter (HOSPITAL_COMMUNITY): Payer: Self-pay

## 2013-04-17 ENCOUNTER — Ambulatory Visit (HOSPITAL_COMMUNITY): Payer: Medicare HMO

## 2013-04-17 DIAGNOSIS — C6290 Malignant neoplasm of unspecified testis, unspecified whether descended or undescended: Secondary | ICD-10-CM | POA: Diagnosis not present

## 2013-04-17 HISTORY — DX: Malignant neoplasm of unspecified testis, unspecified whether descended or undescended: C62.90

## 2013-04-17 LAB — CBC WITH DIFFERENTIAL/PLATELET
BASOS ABS: 0 10*3/uL (ref 0.0–0.1)
Basophils Relative: 1 % (ref 0–1)
EOS ABS: 0.1 10*3/uL (ref 0.0–0.7)
EOS PCT: 4 % (ref 0–5)
HCT: 38.1 % — ABNORMAL LOW (ref 39.0–52.0)
Hemoglobin: 12.7 g/dL — ABNORMAL LOW (ref 13.0–17.0)
Lymphocytes Relative: 40 % (ref 12–46)
Lymphs Abs: 1.4 10*3/uL (ref 0.7–4.0)
MCH: 33.9 pg (ref 26.0–34.0)
MCHC: 33.3 g/dL (ref 30.0–36.0)
MCV: 101.6 fL — ABNORMAL HIGH (ref 78.0–100.0)
Monocytes Absolute: 0.4 10*3/uL (ref 0.1–1.0)
Monocytes Relative: 11 % (ref 3–12)
NEUTROS ABS: 1.6 10*3/uL — AB (ref 1.7–7.7)
Neutrophils Relative %: 44 % (ref 43–77)
Platelets: 214 10*3/uL (ref 150–400)
RBC: 3.75 MIL/uL — ABNORMAL LOW (ref 4.22–5.81)
RDW: 13.3 % (ref 11.5–15.5)
WBC: 3.6 10*3/uL — ABNORMAL LOW (ref 4.0–10.5)

## 2013-04-17 LAB — COMPREHENSIVE METABOLIC PANEL
ALBUMIN: 4.1 g/dL (ref 3.5–5.2)
ALT: 11 U/L (ref 0–53)
AST: 20 U/L (ref 0–37)
Alkaline Phosphatase: 61 U/L (ref 39–117)
BUN: 10 mg/dL (ref 6–23)
CALCIUM: 9.7 mg/dL (ref 8.4–10.5)
CHLORIDE: 103 meq/L (ref 96–112)
CO2: 27 mEq/L (ref 19–32)
CREATININE: 0.81 mg/dL (ref 0.50–1.35)
GFR calc Af Amer: >90 mL/min (ref >90)
GFR calc non Af Amer: 87 mL/min — ABNORMAL LOW (ref >90)
Glucose, Bld: 112 mg/dL — ABNORMAL HIGH (ref 70–99)
Potassium: 4.4 mEq/L (ref 3.7–5.3)
Sodium: 140 mEq/L (ref 137–147)
TOTAL PROTEIN: 7.6 g/dL (ref 6.0–8.3)
Total Bilirubin: 0.4 mg/dL (ref 0.3–1.2)

## 2013-04-17 LAB — AFP TUMOR MARKER: AFP TUMOR MARKER: 1.6 ng/mL (ref 0.0–8.0)

## 2013-04-17 LAB — LACTATE DEHYDROGENASE: LDH: 181 U/L (ref 94–250)

## 2013-04-17 MED ORDER — IOHEXOL 300 MG/ML  SOLN
100.0000 mL | Freq: Once | INTRAMUSCULAR | Status: AC | PRN
  Administered 2013-04-17: 100 mL via INTRAVENOUS

## 2013-04-17 NOTE — Progress Notes (Signed)
Labs drawn today for cbc/diffcmp,ldh,afp,hcg

## 2013-04-19 ENCOUNTER — Encounter (HOSPITAL_COMMUNITY): Payer: Self-pay

## 2013-04-19 ENCOUNTER — Encounter (HOSPITAL_BASED_OUTPATIENT_CLINIC_OR_DEPARTMENT_OTHER): Payer: Medicare HMO

## 2013-04-19 VITALS — BP 142/59 | HR 53 | Temp 97.5°F | Resp 16 | Wt 165.0 lb

## 2013-04-19 DIAGNOSIS — C6290 Malignant neoplasm of unspecified testis, unspecified whether descended or undescended: Secondary | ICD-10-CM

## 2013-04-19 DIAGNOSIS — D709 Neutropenia, unspecified: Secondary | ICD-10-CM

## 2013-04-19 DIAGNOSIS — I10 Essential (primary) hypertension: Secondary | ICD-10-CM

## 2013-04-19 DIAGNOSIS — C779 Secondary and unspecified malignant neoplasm of lymph node, unspecified: Secondary | ICD-10-CM

## 2013-04-19 NOTE — Patient Instructions (Signed)
[facility] Discharge Instructions  RECOMMENDATIONS MADE BY THE CONSULTANT AND ANY TEST RESULTS WILL BE SENT TO YOUR REFERRING PHYSICIAN.  EXAM FINDINGS BY THE PHYSICIAN TODAY AND SIGNS OR SYMPTOMS TO REPORT TO CLINIC OR PRIMARY PHYSICIAN: Exam and findings as discussed by Dr.Formanek.  MEDICATIONS PRESCRIBED:  Continue as prescribed.  INSTRUCTIONS/FOLLOW-UP: Lab work every 3 months, due again in July 2015. CT scan to be scheduled early July 2015 and MD appointment following scan. Report any issues/concerns to clinic as needed prior to appointments.  Thank you for choosing [facility] to provide your oncology and hematology care.  To afford each patient quality time with our providers, please arrive at least 15 minutes before your scheduled appointment time.  With your help, our goal is to use those 15 minutes to complete the necessary work-up to ensure our physicians have the information they need to help with your evaluation and healthcare recommendations.    Effective January 1st, 2014, we ask that you re-schedule your appointment with our physicians should you arrive 10 or more minutes late for your appointment.  We strive to give you quality time with our providers, and arriving late affects you and other patients whose appointments are after yours.    Again, thank you for choosing [facility].  Our hope is that these requests will decrease the amount of time that you wait before being seen by our physicians.       _____________________________________________________________  Should you have questions after your visit to [facility], please contact our office at [phone] between the hours of 8:30 a.m. and 5:00 p.m.  Voicemails left after 4:30 p.m. will not be returned until the following business day.  For prescription refill requests, have your pharmacy contact our office with your prescription refill request.

## 2013-04-19 NOTE — Progress Notes (Signed)
[facility]  OFFICE PROGRESS NOTE  [name], MD [place] [id number]  DIAGNOSIS: No diagnosis found.  Chief Complaint  Patient presents with  . Stage I seminoma    CURRENT THERAPY: Intense surveillance after right radical orchiectomy on 12/21/2012, stage I-be with primary tumor 7.5 cm in size with lymphovascular invasion.  INTERVAL HISTORY: Nathaniel Hicks 73 y.o. 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  He has had minimal nasal drip without sore throat, earache, cough, wheezing. He denies any worsening back pain, lower extremity swelling or redness, abnormalities on testis examination, fever, night sweats, diarrhea, constipation, dysuria, hematuria, nocturia, skin rash, headache, or seizures.  MEDICAL HISTORY: Past Medical History  Diagnosis Date  . Hypertension   . BPH (benign prostatic hyperplasia)   . HOH (hard of hearing)   . Arthritis   . Testicular cancer     2014    INTERIM HISTORY: has Testicle cancer on his problem list.   Right radical orchiectomy was performed on 12/21/2012 with findings of a pure seminoma with lymphovascular invasion, stage I B. with primary tumor 7.5 cm in size. Patient opted for CT surveillance as opposed to one or 2 cycles of chemotherapy.  ALLERGIES:  has No Known Allergies.  MEDICATIONS: has a current medication list which includes the following prescription(s): amlodipine-benazepril, naproxen, oxycodone-acetaminophen, tamsulosin, and tramadol.  SURGICAL HISTORY:  Past Surgical History  Procedure Laterality Date  . Scalp laceration repair      [facility]-Dr Tamala Julian  . Hemorroidectomy    . Lesion excision N/A 03/23/2012    Procedure: EXCISION NEOPLASM SCALP ;  Surgeon: Jamesetta So, MD;  Location: [facility];  Service: General;  Laterality: N/A;  Excision of  Scalp Neoplasm  . Colonoscopy N/A 11/24/2012    Procedure: COLONOSCOPY;  Surgeon: Rogene Houston, MD;  Location: [facility];  Service: Endoscopy;  Laterality: N/A;  830-moved to [place] notified pt  . Orchiectomy Right 12/20/2012    Procedure: RIGHT RADICAL ORCHIECTOMY/POSSIBLE BX RIGHT TESTICLE;  Surgeon: Marissa Nestle, MD;  Location: [facility];  Service: Urology;  Laterality: Right;    FAMILY HISTORY: family history is not on file.  SOCIAL HISTORY:  reports that he has been smoking Cigarettes.  He has a 25 pack-year smoking history. He does not have any smokeless tobacco history on file. He reports that he drinks about 1.8 ounces of alcohol per week. He reports that he does not use illicit drugs.  REVIEW OF SYSTEMS:  Other than that discussed above is noncontributory.  PHYSICAL EXAMINATION: ECOG PERFORMANCE STATUS: 1 - Symptomatic but completely ambulatory  There were no vitals taken for this visit.  GENERAL:alert, no distress and comfortable SKIN: skin color, texture, turgor are normal, no rashes or significant lesions EYES: PERLA; Conjunctiva are pink and non-injected, sclera clear SINUSES: No redness or tenderness over maxillary or ethmoid sinuses OROPHARYNX:no exudate, no erythema on lips, buccal mucosa, or tongue. NECK: supple, thyroid normal size, non-tender, without nodularity. No masses CHEST: Normal AP diameter with no gynecomastia. LYMPH:  no palpable lymphadenopathy in the cervical, axillary or inguinal LUNGS: clear to auscultation and percussion with normal breathing effort HEART: regular rate & rhythm and no murmurs. ABDOMEN:abdomen soft, non-tender and normal bowel sounds MUSCULOSKELETAL:no cyanosis of digits and no clubbing. Range of motion normal.  NEURO: alert & oriented x 3  with fluent speech, no focal motor/sensory deficits GENITALIA: Absent right testis. Left testis without mass. No inguinal adenopathy.   LABORATORY DATA: Infusion on 04/17/2013  Component  Date Value Ref Range Status  . WBC 04/17/2013 3.6* 4.0 - 10.5 K/uL Final  . RBC 04/17/2013 3.75* 4.22 - 5.81 MIL/uL Final  . Hemoglobin 04/17/2013 12.7* 13.0 - 17.0 g/dL Final  . HCT 04/17/2013 38.1* 39.0 - 52.0 % Final  . MCV 04/17/2013 101.6* 78.0 - 100.0 fL Final  . MCH 04/17/2013 33.9  26.0 - 34.0 pg Final  . MCHC 04/17/2013 33.3  30.0 - 36.0 g/dL Final  . RDW 04/17/2013 13.3  11.5 - 15.5 % Final  . Platelets 04/17/2013 214  150 - 400 K/uL Final  . Neutrophils Relative % 04/17/2013 44  43 - 77 % Final  . Neutro Abs 04/17/2013 1.6* 1.7 - 7.7 K/uL Final  . Lymphocytes Relative 04/17/2013 40  12 - 46 % Final  . Lymphs Abs 04/17/2013 1.4  0.7 - 4.0 K/uL Final  . Monocytes Relative 04/17/2013 11  3 - 12 % Final  . Monocytes Absolute 04/17/2013 0.4  0.1 - 1.0 K/uL Final  . Eosinophils Relative 04/17/2013 4  0 - 5 % Final  . Eosinophils Absolute 04/17/2013 0.1  0.0 - 0.7 K/uL Final  . Basophils Relative 04/17/2013 1  0 - 1 % Final  . Basophils Absolute 04/17/2013 0.0  0.0 - 0.1 K/uL Final  . Sodium 04/17/2013 140  137 - 147 mEq/L Final  . Potassium 04/17/2013 4.4  3.7 - 5.3 mEq/L Final  . Chloride 04/17/2013 103  96 - 112 mEq/L Final  . CO2 04/17/2013 27  19 - 32 mEq/L Final  . Glucose, Bld 04/17/2013 112* 70 - 99 mg/dL Final  . BUN 04/17/2013 10  6 - 23 mg/dL Final  . Creatinine, Ser 04/17/2013 0.81  0.50 - 1.35 mg/dL Final  . Calcium 04/17/2013 9.7  8.4 - 10.5 mg/dL Final  . Total Protein 04/17/2013 7.6  6.0 - 8.3 g/dL Final  . Albumin 04/17/2013 4.1  3.5 - 5.2 g/dL Final  . AST 04/17/2013 20  0 - 37 U/L Final  . ALT 04/17/2013 11  0 - 53 U/L Final  . Alkaline Phosphatase 04/17/2013 61  39 - 117 U/L Final  . Total Bilirubin 04/17/2013 0.4  0.3 - 1.2 mg/dL Final  . GFR calc non Af Amer 04/17/2013 87* >90 mL/min Final  . GFR calc Af Amer 04/17/2013 >90  >90 mL/min Final   Comment: (NOTE)                          The eGFR has been calculated using the CKD EPI equation.                           This calculation has not been validated in all clinical situations.                          eGFR's persistently <90 mL/min signify possible Chronic Kidney                          Disease.  Marland Kitchen LDH 04/17/2013 181  94 - 250 U/L Final  . AFP-Tumor Marker 04/17/2013 1.6  0.0 - 8.0 ng/mL Final   Comment: (NOTE)  The [employer] Centaur AFP immunoassay method is used.  Results obtained                          with different assay methods or kits cannot be used interchangeably.                          AFP is a valuable aid in the management of nonseminomatous testicular                          cancer patients when used in conjunction with information available                          from the clinical evaluation and other diagnostic procedures.                          Increased serum AFP concentrations have also been observed in ataxia                          telangiectasia, hereditary tyrosinemia, primary hepatocellular                          carcinoma, teratocarcinoma, gastrointestinal tract cancers with and                          without liver metastases, and in benign hepatic conditions such as                          acute viral hepatitis, chronic active hepatitis, and cirrhosis.  This                          result cannot be interpreted as absolute evidence of the presence or                          absence of malignant disease.  This result is not interpretable in                          pregnant females.                          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 Metastatic surveillance.  EXAM: CT  CHEST, ABDOMEN, AND PELVIS WITH CONTRAST  TECHNIQUE: Multidetector CT imaging of the chest, abdomen and pelvis was performed following the standard protocol during bolus administration of intravenous contrast.  CONTRAST:  127mL OMNIPAQUE IOHEXOL 300 MG/ML  SOLN  COMPARISON:  NM PET IMAGE INITIAL (PI) SKULL BASE TO THIGH dated 01/18/2013  FINDINGS: CT CHEST FINDINGS  No pathologically enlarged mediastinal, hilar or axillary lymph nodes. Proximal left anterior descending coronary artery calcification. Heart is mildly enlarged. No pericardial effusion. Respiratory motion somewhat degrades image quality upon review of the lung windows. Mild scarring and/or subsegmental  atelectasis in the lower lobes. Lungs are otherwise clear. No pleural fluid. Airway is unremarkable.  CT ABDOMEN AND PELVIS FINDINGS  Liver, gallbladder and adrenal glands are unremarkable. Right kidney appears non rotated. Low-attenuation lesion in the upper pole right kidney measures 11 mm and is likely a cyst. A 7 mm low-attenuation lesion in the interpolar right kidney (series 8, image 23) appears to contain fat, indicative of an angiomyolipoma. Left kidney, spleen, pancreas, stomach and bowel are otherwise unremarkable.  Atherosclerotic calcification of the arterial vasculature without abdominal aortic aneurysm. No pathologically enlarged lymph nodes. Specifically, retroperitoneal periaortic lymph nodes remain subcentimeter in size. No free fluid. No worrisome lytic or sclerotic lesions. Degenerative changes are seen in the in spine and hips. Old right L2 and L3 transverse process fractures. Old posterior right twelfth rib fracture.  IMPRESSION: 1. No evidence of metastatic disease in the chest, abdomen or pelvis. 2. Proximal LAD coronary artery calcification.   Electronically Signed   By: Lorin Picket M.D.   On: 04/17/2013 10:29   Ct Abdomen Pelvis W Contrast  04/17/2013   CLINICAL DATA:  History of seminoma resection. Metastatic surveillance.   EXAM: CT CHEST, ABDOMEN, AND PELVIS WITH CONTRAST  TECHNIQUE: Multidetector CT imaging of the chest, abdomen and pelvis was performed following the standard protocol during bolus administration of intravenous contrast.  CONTRAST:  152mL OMNIPAQUE IOHEXOL 300 MG/ML  SOLN  COMPARISON:  NM PET IMAGE INITIAL (PI) SKULL BASE TO THIGH dated 01/18/2013  FINDINGS: CT CHEST FINDINGS  No pathologically enlarged mediastinal, hilar or axillary lymph nodes. Proximal left anterior descending coronary artery calcification. Heart is mildly enlarged. No pericardial effusion. Respiratory motion somewhat degrades image quality upon review of the lung windows. Mild scarring and/or subsegmental atelectasis in the lower lobes. Lungs are otherwise clear. No pleural fluid. Airway is unremarkable.  CT ABDOMEN AND PELVIS FINDINGS  Liver, gallbladder and adrenal glands are unremarkable. Right kidney appears non rotated. Low-attenuation lesion in the upper pole right kidney measures 11 mm and is likely a cyst. A 7 mm low-attenuation lesion in the interpolar right kidney (series 8, image 23) appears to contain fat, indicative of an angiomyolipoma. Left kidney, spleen, pancreas, stomach and bowel are otherwise unremarkable.  Atherosclerotic calcification of the arterial vasculature without abdominal aortic aneurysm. No pathologically enlarged lymph nodes. Specifically, retroperitoneal periaortic lymph nodes remain subcentimeter in size. No free fluid. No worrisome lytic or sclerotic lesions. Degenerative changes are seen in the in spine and hips. Old right L2 and L3 transverse process fractures. Old posterior right twelfth rib fracture.  IMPRESSION: 1. No evidence of metastatic disease in the chest, abdomen or pelvis. 2. Proximal LAD coronary artery calcification.   Electronically Signed   By: Lorin Picket M.D.   On: 04/17/2013 10:29    ASSESSMENT:  #1. Stage I-B seminoma, status post right radical orchiectomy, normal PET scan, 4 tumor  marker studies today including LDH, beta hCG, and alpha-fetoprotein.  #2. Degenerative joint disease.  #3. Hypertension, on treatment. #4. Neutropenia.    PLAN:  #1. Active surveillance with CT scans at 3, 6, and 12 months for the first year, every 6 months for the next 2 years, and then every year for the last 2 years of five-year surveillance. #2. Followup in July 2015 just prior to which lab tests and CAT scans will be performed. #3. Patient was instructed to do so this is exam a monthly basis and to come in earlier if you feel something abnormal.   All questions  were answered. The patient knows to call the clinic with any problems, questions or concerns. We can certainly see the patient much sooner if necessary.   I spent 25 minutes counseling the patient face to face. The total time spent in the appointment was 30 minutes.    Farrel Gobble, MD 04/19/2013 10:26 AM

## 2013-04-21 LAB — BETA HCG QUANT (REF LAB)

## 2013-07-18 ENCOUNTER — Other Ambulatory Visit (HOSPITAL_COMMUNITY): Payer: Medicare HMO

## 2013-07-18 ENCOUNTER — Ambulatory Visit (HOSPITAL_COMMUNITY): Payer: Medicare HMO

## 2013-07-18 ENCOUNTER — Other Ambulatory Visit (HOSPITAL_COMMUNITY): Payer: Self-pay | Admitting: Hematology and Oncology

## 2013-07-19 ENCOUNTER — Ambulatory Visit (HOSPITAL_COMMUNITY): Payer: Medicare HMO

## 2013-07-19 ENCOUNTER — Other Ambulatory Visit (HOSPITAL_COMMUNITY): Payer: Medicare HMO

## 2013-07-20 ENCOUNTER — Ambulatory Visit (HOSPITAL_COMMUNITY)
Admission: RE | Admit: 2013-07-20 | Discharge: 2013-07-20 | Disposition: A | Discharge status: Home / Self Care | Payer: Medicare HMO | Source: Ambulatory Visit | Attending: Hematology and Oncology | Admitting: Hematology and Oncology

## 2013-07-20 ENCOUNTER — Encounter (HOSPITAL_COMMUNITY): Payer: Self-pay

## 2013-07-20 ENCOUNTER — Other Ambulatory Visit (HOSPITAL_COMMUNITY): Payer: Medicare HMO

## 2013-07-20 DIAGNOSIS — M51379 Other intervertebral disc degeneration, lumbosacral region without mention of lumbar back pain or lower extremity pain: Secondary | ICD-10-CM | POA: Insufficient documentation

## 2013-07-20 DIAGNOSIS — C6290 Malignant neoplasm of unspecified testis, unspecified whether descended or undescended: Secondary | ICD-10-CM | POA: Insufficient documentation

## 2013-07-20 DIAGNOSIS — M5137 Other intervertebral disc degeneration, lumbosacral region: Secondary | ICD-10-CM | POA: Diagnosis not present

## 2013-07-20 LAB — POCT I-STAT CREATININE: Creatinine, Ser: 0.9 mg/dL (ref 0.50–1.35)

## 2013-07-20 MED ORDER — IOHEXOL 300 MG/ML  SOLN
100.0000 mL | Freq: Once | INTRAMUSCULAR | Status: AC | PRN
  Administered 2013-07-20: 100 mL via INTRAVENOUS

## 2013-07-23 NOTE — Progress Notes (Signed)
Nathaniel Curet, MD [place] [address]  Testicle cancer, right - Plan: CT Abdomen Pelvis W Contrast, DG Chest 2 View  CURRENT THERAPY: Intense surveillance after right radical orchiectomy on 12/21/2012.   INTERVAL HISTORY: Nathaniel Hicks 73 y.o. 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     Testicle cancer, right   12/20/2012 Surgery Right total orchiectomyt- 1. Testis, biopsy, right - SEMINOMA. PLEASE SEE COMMENT. 2. Testis, tumor, right - SEMINOMA, 7.5 CM. - ANGIOLYMPHATIC INVASION PRESENT. - RESECTION MARGINS, NEGATIVE FOR ATYPIA OR MALIGNANCY.   I personally reviewed and went over laboratory results with the patient.  The results are noted within this dictation.  I personally reviewed and went over radiographic studies with the patient.  The results are noted within this dictation.  CT abd/pelvis on 7/9 demonstrates the following: No evidence of metastatic disease in the abdomen or pelvis.  He denies any complaints oncologically and ROS questioning is negative.    Past Medical History  Diagnosis Date  . Hypertension   . BPH (benign prostatic hyperplasia)   . HOH (hard of hearing)   . Arthritis   . Testicular cancer     2014    has Testicle cancer, right on his problem list.     has No Known Allergies.  Nathaniel Hicks does not currently have medications on file.  Past Surgical History  Procedure Laterality Date  . Scalp laceration repair      [facility]-Dr Tamala Julian  . Hemorroidectomy    . Lesion excision N/A 03/23/2012    Procedure: EXCISION NEOPLASM SCALP ;  Surgeon: Jamesetta So, MD;  Location: [facility];  Service: General;  Laterality: N/A;  Excision of Scalp Neoplasm  . Colonoscopy N/A 11/24/2012    Procedure: COLONOSCOPY;  Surgeon: Rogene Houston, MD;  Location[facility];  Service: Endoscopy;  Laterality:  N/A;  830-moved to [place] notified pt  . Orchiectomy Right 12/20/2012    Procedure: RIGHT RADICAL ORCHIECTOMY/POSSIBLE BX RIGHT TESTICLE;  Surgeon: Marissa Nestle, MD;  Location: [facility];  Service: Urology;  Laterality: Right;    Denies any headaches, dizziness, double vision, fevers, chills, night sweats, nausea, vomiting, diarrhea, constipation, chest pain, heart palpitations, shortness of breath, blood in stool, black tarry stool, urinary pain, urinary burning, urinary frequency, hematuria.   PHYSICAL EXAMINATION  ECOG PERFORMANCE STATUS: 1 - Symptomatic but completely ambulatory  Filed Vitals:   07/24/13 1000  BP: 121/64  Pulse: 62  Temp: 97.9 F (36.6 C)  Resp: 18    GENERAL:alert, no distress, well nourished, well developed, comfortable, cooperative and smiling SKIN: skin color, texture, turgor are normal, no rashes or significant lesions HEAD: Normocephalic, No masses, lesions, tenderness or abnormalities EYES: normal, PERRLA, EOMI, Conjunctiva are pink and non-injected EARS: External ears normal OROPHARYNX:lips, buccal mucosa, and tongue normal and mucous membranes are moist  NECK: supple, no adenopathy, thyroid normal size, non-tender, without nodularity, no stridor, non-tender, trachea midline LYMPH:  no palpable lymphadenopathy BREAST:not examined LUNGS: clear to auscultation , decreased breath sounds HEART: regular rate & rhythm, no murmurs and no gallops ABDOMEN:abdomen soft, non-tender and normal bowel sounds BACK: Back symmetric, no curvature. EXTREMITIES:less then 2 second capillary refill, no joint deformities, effusion, or inflammation, no skin discoloration, no clubbing, no cyanosis  NEURO: alert & oriented x 3 with fluent speech, no focal motor/sensory deficits, gait  normal   LABORATORY DATA:  CBC    Component Value Date/Time   WBC 3.6* 04/17/2013 0839   RBC 3.75* 04/17/2013 0839   HGB 12.7* 04/17/2013 0839   HCT 38.1* 04/17/2013 0839   PLT 214 04/17/2013  0839   MCV 101.6* 04/17/2013 0839   MCH 33.9 04/17/2013 0839   MCHC 33.3 04/17/2013 0839   RDW 13.3 04/17/2013 0839   LYMPHSABS 1.4 04/17/2013 0839   MONOABS 0.4 04/17/2013 0839   EOSABS 0.1 04/17/2013 0839   BASOSABS 0.0 04/17/2013 0839      Chemistry      Component Value Date/Time   NA 140 04/17/2013 0839   K 4.4 04/17/2013 0839   CL 103 04/17/2013 0839   CO2 27 04/17/2013 0839   BUN 10 04/17/2013 0839   CREATININE 0.90 07/20/2013 1250      Component Value Date/Time   CALCIUM 9.7 04/17/2013 0839   ALKPHOS 61 04/17/2013 0839   AST 20 04/17/2013 0839   ALT 11 04/17/2013 0839   BILITOT 0.4 04/17/2013 0839     No results found for this basename: AFP   Results for Nathaniel, Hicks (MRN [id number]) as of 07/23/2013 11:25  Ref. Range 04/17/2013 08:39  AFP-Tumor Marker Latest Range: 0.0-8.0 ng/mL 1.6  Beta hCG, Tumor Marker Latest Range: < 5.0 mIU/mL < 2.0   Results for Nathaniel Hicks (MRN [id number]) as of 07/23/2013 11:25  Ref. Range 04/17/2013 08:39  LDH Latest Range: 94-250 U/L 181     RADIOGRAPHIC STUDIES:  07/20/2013  CLINICAL DATA: Testicular cancer  EXAM:  CT ABDOMEN AND PELVIS WITH CONTRAST  TECHNIQUE:  Multidetector CT imaging of the abdomen and pelvis was performed  using the standard protocol following bolus administration of  intravenous contrast.  CONTRAST: 171mL OMNIPAQUE IOHEXOL 300 MG/ML SOLN  COMPARISON: 04/17/2013  FINDINGS:  Heart is mildly enlarged. Linear scarring in the lung bases. No  pleural effusions.  Gallbladder is contracted. Liver, spleen, pancreas, adrenals are  unremarkable. Malrotation of the right kidney, stable. Small  low-attenuation lesion in the upper pole of the right kidney is most  compatible with small cyst, stable. 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 Left kidney is unremarkable. No hydronephrosis.  Prostate is mildly prominent. Urinary bladder grossly unremarkable.  Stomach, large and small bowel grossly unremarkable. Small scattered  mesenteric and  retroperitoneal lymph nodes, none pathologically  enlarged or changed. Tortuosity and atherosclerotic disease in the  aorta and iliac vessels. No aneurysm.  Right external chain lymph node on image 73 as a short axis diameter  of 8 mm, stable. No pelvic adenopathy. Small bilateral inguinal  lymph nodes are unchanged and maintain normal central fatty hilum.  Degenerative disc disease in the lower lumbar spine. No acute bony  abnormality or focal bone lesion.  IMPRESSION:  No evidence of metastatic disease in the abdomen or pelvis.  Electronically Signed  By: Rolm Baptise M.D.  On: 07/20/2013 16:28    ASSESSMENT:  1. Stage I-B seminoma, status post right radical orchiectomy, normal PET scan 2. Degenerative joint disease.  3. Hypertension, on treatment.  4. Neutropenia.  Patient Active Problem List   Diagnosis Date Noted  . Testicle cancer, right 12/20/2012     PLAN:  1. I personally reviewed and went over laboratory results with the patient.  The results are noted within this dictation. 2. I personally reviewed and went over radiographic studies with the patient.  The results are noted within this dictation.   3. CBC diff, CMET, LDH, AFP,  and HCG today and every 3 months 4. CT Abd/pelvis with contrast in 3 months 5. Chest xray in 3 months 6. Return in 3 months for follow-up   THERAPY PLAN:  NCCN guidelines surveillance for Stage IB nonseminomatous testicular cancer recommends the following surveillance:  A. Year 1: H+P and markers every 2 months, abdominal/pelvic CT every 4-6 months, Chest X-ray at month 4 and 12.  2. Year 2: H+P and markers every 3 months, abdominal/pelvic CT every 6-12 months, chest x-ray annually.  3. Year 3: H+P and markers every 4-6 months, abdominal/pelvic CT annually, and chest x-ray annually.   4. Year 4: H+P and markers every 6 months and chest x-ray annually.  5. Year 5: H+P and markers annually and a chest x-ray annually.   All questions were  answered. The patient knows to call the clinic with any problems, questions or concerns. We can certainly see the patient much sooner if necessary.  Patient and plan discussed with Dr. Farrel Gobble and he is in agreement with the aforementioned.   Nathaniel Hicks 07/24/2013

## 2013-07-24 ENCOUNTER — Encounter (HOSPITAL_COMMUNITY): Payer: Self-pay | Admitting: Oncology

## 2013-07-24 ENCOUNTER — Encounter (HOSPITAL_BASED_OUTPATIENT_CLINIC_OR_DEPARTMENT_OTHER): Payer: Medicare HMO

## 2013-07-24 ENCOUNTER — Encounter (HOSPITAL_COMMUNITY): Payer: Medicare HMO | Attending: Hematology and Oncology | Admitting: Oncology

## 2013-07-24 VITALS — BP 121/64 | HR 62 | Temp 97.9°F | Resp 18 | Wt 163.4 lb

## 2013-07-24 DIAGNOSIS — C6290 Malignant neoplasm of unspecified testis, unspecified whether descended or undescended: Secondary | ICD-10-CM

## 2013-07-24 DIAGNOSIS — C6291 Malignant neoplasm of right testis, unspecified whether descended or undescended: Secondary | ICD-10-CM

## 2013-07-24 LAB — CBC WITH DIFFERENTIAL/PLATELET
BASOS ABS: 0 10*3/uL (ref 0.0–0.1)
BASOS PCT: 1 % (ref 0–1)
Eosinophils Absolute: 0.2 10*3/uL (ref 0.0–0.7)
Eosinophils Relative: 4 % (ref 0–5)
HEMATOCRIT: 38.6 % — AB (ref 39.0–52.0)
Hemoglobin: 12.8 g/dL — ABNORMAL LOW (ref 13.0–17.0)
Lymphocytes Relative: 31 % (ref 12–46)
Lymphs Abs: 1.2 10*3/uL (ref 0.7–4.0)
MCH: 33.4 pg (ref 26.0–34.0)
MCHC: 33.2 g/dL (ref 30.0–36.0)
MCV: 100.8 fL — ABNORMAL HIGH (ref 78.0–100.0)
MONO ABS: 0.4 10*3/uL (ref 0.1–1.0)
Monocytes Relative: 10 % (ref 3–12)
Neutro Abs: 2 10*3/uL (ref 1.7–7.7)
Neutrophils Relative %: 54 % (ref 43–77)
Platelets: 209 10*3/uL (ref 150–400)
RBC: 3.83 MIL/uL — ABNORMAL LOW (ref 4.22–5.81)
RDW: 13.6 % (ref 11.5–15.5)
WBC: 3.8 10*3/uL — ABNORMAL LOW (ref 4.0–10.5)

## 2013-07-24 LAB — COMPREHENSIVE METABOLIC PANEL
ALT: 11 U/L (ref 0–53)
AST: 17 U/L (ref 0–37)
Albumin: 4 g/dL (ref 3.5–5.2)
Alkaline Phosphatase: 57 U/L (ref 39–117)
Anion gap: 6 (ref 5–15)
BUN: 12 mg/dL (ref 6–23)
CO2: 28 meq/L (ref 19–32)
CREATININE: 0.78 mg/dL (ref 0.50–1.35)
Calcium: 9.6 mg/dL (ref 8.4–10.5)
Chloride: 105 mEq/L (ref 96–112)
GFR calc Af Amer: >90 mL/min (ref >90)
GFR, EST NON AFRICAN AMERICAN: 88 mL/min — AB (ref >90)
Glucose, Bld: 93 mg/dL (ref 70–99)
Potassium: 4.8 mEq/L (ref 3.7–5.3)
Sodium: 139 mEq/L (ref 137–147)
Total Bilirubin: 0.4 mg/dL (ref 0.3–1.2)
Total Protein: 6.9 g/dL (ref 6.0–8.3)

## 2013-07-24 LAB — LACTATE DEHYDROGENASE: LDH: 170 U/L (ref 94–250)

## 2013-07-24 NOTE — Patient Instructions (Signed)
[facility] Discharge Instructions  RECOMMENDATIONS MADE BY THE CONSULTANT AND ANY TEST RESULTS WILL BE SENT TO YOUR REFERRING PHYSICIAN.  We will see you in 3 months for repeat lab work, and a [place] visit. You will have repeat CT scans in 3 months before your office visit.    Thank you for choosing [facility] to provide your oncology and hematology care.  To afford each patient quality time with our providers, please arrive at least 15 minutes before your scheduled appointment time.  With your help, our goal is to use those 15 minutes to complete the necessary work-up to ensure our physicians have the information they need to help with your evaluation and healthcare recommendations.    Effective January 1st, 2014, we ask that you re-schedule your appointment with our physicians should you arrive 10 or more minutes late for your appointment.  We strive to give you quality time with our providers, and arriving late affects you and other patients whose appointments are after yours.    Again, thank you for choosing [facility].  Our hope is that these requests will decrease the amount of time that you wait before being seen by our physicians.       _____________________________________________________________  Should you have questions after your visit to [facility], please contact our office at [phone] between the hours of 8:30 a.m. and 4:30 p.m.  Voicemails left after 4:30 p.m. will not be returned until the following business day.  For prescription refill requests, have your pharmacy contact our office with your prescription refill request.    _______________________________________________________________  We hope that we have given you very good care.  You may receive a patient satisfaction survey in the mail, please complete it and return it as soon as possible.  We value your  feedback!  _______________________________________________________________  Have you asked about our STAR program?  STAR stands for Survivorship Training and Rehabilitation, and this is a nationally recognized cancer care program that focuses on survivorship and rehabilitation.  Cancer and cancer treatments may cause problems, such as, pain, making you feel tired and keeping you from doing the things that you need or want to do. Cancer rehabilitation can help. Our goal is to reduce these troubling effects and help you have the best quality of life possible.  You may receive a survey from a nurse that asks questions about your current state of health.  Based on the survey results, all eligible patients will be referred to the [facility] program for an evaluation so we can better serve you!  A frequently asked questions sheet is available upon request.

## 2013-07-24 NOTE — Progress Notes (Signed)
LABS DRAWN FOR CBCD, LDH,CMP,AFPTM, HCGTM

## 2013-07-25 LAB — AFP TUMOR MARKER: AFP TUMOR MARKER: 1.4 ng/mL (ref 0.0–8.0)

## 2013-07-27 LAB — BETA HCG QUANT (REF LAB): Beta hCG, Tumor Marker: <2 m[IU]/mL (ref <5.0)

## 2013-10-22 NOTE — Progress Notes (Signed)
Nathaniel Curet, MD [place] [address]  Testicle cancer, right - Plan: RAPAFLO 8 MG CAPS capsule, DiphenhydrAMINE HCl (BENADRYL PO), Tetrahydrozoline HCl (REDNESS RELIEVER EYE DROPS OP), Influenza vac split quadrivalent PF (FLUARIX) injection 0.5 mL, CT Abdomen Pelvis W Contrast, DG Chest 2 View  CURRENT THERAPY: Intense surveillance after right radical orchiectomy on 12/21/2012 per NCCN guidelines.  INTERVAL HISTORY: Nathaniel Hicks 73 y.o. 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     Testicle cancer, right   12/20/2012 Surgery Right total orchiectomyt- 1. Testis, biopsy, right - SEMINOMA. PLEASE SEE COMMENT. 2. Testis, tumor, right - SEMINOMA, 7.5 CM. - ANGIOLYMPHATIC INVASION PRESENT. - RESECTION MARGINS, NEGATIVE FOR ATYPIA OR MALIGNANCY.     I personally reviewed and went over laboratory results with the patient.  The results are noted within this dictation.  I personally reviewed and went over radiographic studies with the patient.  The results are noted within this dictation.  CT abd/pelvis on 10/13 demonstrates: No findings to suggest metastatic disease in the abdomen or pelvis.  Chest xray was negative for any metastatic disease too.  Oncologically, he denies any complaints and ROS questioning is negative.   Past Medical History  Diagnosis Date  . Hypertension   . BPH (benign prostatic hyperplasia)   . HOH (hard of hearing)   . Arthritis   . Testicular cancer     2014    has Testicle cancer, right on his problem list.     has No Known Allergies.  We administered Influenza vac split quadrivalent PF.  Past Surgical History  Procedure Laterality Date  . Scalp laceration repair      [facility]-Dr Tamala Julian  . Hemorroidectomy    . Lesion excision N/A 03/23/2012    Procedure: EXCISION NEOPLASM SCALP ;  Surgeon: Jamesetta So, MD;  Location: [facility];  Service: General;  Laterality: N/A;  Excision of Scalp Neoplasm  . Colonoscopy N/A 11/24/2012    Procedure: COLONOSCOPY;  Surgeon: Rogene Houston, MD;  Location: [facility];  Service: Endoscopy;  Laterality: N/A;  830-moved to [place] notified pt  . Orchiectomy Right 12/20/2012    Procedure: RIGHT RADICAL ORCHIECTOMY/POSSIBLE BX RIGHT TESTICLE;  Surgeon: Marissa Nestle, MD;  Location: [facility];  Service: Urology;  Laterality: Right;    Denies any headaches, dizziness, double vision, fevers, chills, night sweats, nausea, vomiting, diarrhea, constipation, chest pain, heart palpitations, shortness of breath, blood in stool, black tarry stool, urinary pain, urinary burning, urinary frequency, hematuria.   PHYSICAL EXAMINATION  ECOG PERFORMANCE STATUS: 0 - Asymptomatic  Filed Vitals:   10/25/13 1047  BP: 124/51  Pulse: 55  Temp: 97.9 F (36.6 C)  Resp: 16    GENERAL:alert, no distress, well nourished, well developed, comfortable, cooperative and smiling SKIN: skin color, texture, turgor are normal, no rashes or significant lesions HEAD: Normocephalic, No masses, lesions, tenderness or abnormalities EYES: normal, PERRLA, EOMI, Conjunctiva are pink and non-injected EARS: External ears normal OROPHARYNX:lips, buccal mucosa, and tongue normal and mucous membranes are moist  NECK: supple, no adenopathy, thyroid normal size, non-tender, without nodularity, no stridor, non-tender, trachea midline LYMPH:  no palpable lymphadenopathy, no hepatosplenomegaly BREAST:not examined LUNGS: clear to auscultation and percussion HEART: regular rate & rhythm, no murmurs, no gallops, S1 normal and S2 normal ABDOMEN:abdomen soft, non-tender, obese, normal bowel sounds, no masses or  organomegaly and no hepatosplenomegaly BACK: Back symmetric, no curvature., No CVA tenderness EXTREMITIES:less then 2 second capillary refill, no joint deformities, effusion, or inflammation,  no edema, no skin discoloration, no cyanosis, positive findings:  Clubbing of fingernails  NEURO: alert & oriented x 3 with fluent speech, no focal motor/sensory deficits, gait normal TESTICLES: Left testicle in place without any palpable abnormalities.  Scrotum exam does not demonstrate any lesions.    LABORATORY DATA: CBC    Component Value Date/Time   WBC 4.6 10/25/2013 0933   RBC 3.80* 10/25/2013 0933   HGB 12.8* 10/25/2013 0933   HCT 37.8* 10/25/2013 0933   PLT 245 10/25/2013 0933   MCV 99.5 10/25/2013 0933   MCH 33.7 10/25/2013 0933   MCHC 33.9 10/25/2013 0933   RDW 13.6 10/25/2013 0933   LYMPHSABS 1.4 10/25/2013 0933   MONOABS 0.5 10/25/2013 0933   EOSABS 0.2 10/25/2013 0933   BASOSABS 0.0 10/25/2013 0933      Chemistry      Component Value Date/Time   NA 143 10/25/2013 0933   K 4.3 10/25/2013 0933   CL 106 10/25/2013 0933   CO2 27 10/25/2013 0933   BUN 9 10/25/2013 0933   CREATININE 0.75 10/25/2013 0933      Component Value Date/Time   CALCIUM 10.1 10/25/2013 0933   ALKPHOS 58 10/25/2013 0933   AST 19 10/25/2013 0933   ALT 12 10/25/2013 0933   BILITOT 0.5 10/25/2013 0933       RADIOGRAPHIC STUDIES:  10/24/2013  CLINICAL DATA: 73 year old male with history of right-sided  seminoma status post right orchiectomy in 2014. Followup study to  exclude recurrence or metastatic disease.  EXAM:  CT ABDOMEN AND PELVIS WITH CONTRAST  TECHNIQUE:  Multidetector CT imaging of the abdomen and pelvis was performed  using the standard protocol following bolus administration of  intravenous contrast.  CONTRAST: 147mL OMNIPAQUE IOHEXOL 300 MG/ML SOLN  COMPARISON: CT of the abdomen and pelvis 07/20/2013.  FINDINGS:  Lower chest: Mild dependent subsegmental atelectasis and/or scarring  in the lung bases. Borderline cardiomegaly.  Hepatobiliary: No focal hepatic lesions. No intra or extrahepatic  biliary ductal dilatation. Gallbladder is unremarkable in  appearance.    Pancreas: Unremarkable.  Spleen: Unremarkable.  Adrenals/Urinary Tract: Bilateral adrenal glands are normal.  Extensive perinephric stranding is noted bilaterally, unchanged  compared to the prior examination and nonspecific. 1 cm nonenhancing  low-attenuation lesion in the interpolar region of the right kidney  is unchanged, compatible with a small simple cyst. Right-sided extra  renal pelvis (normal anatomical variant). No hydroureteronephrosis.  Urinary bladder is normal in appearance.  Stomach/Bowel: Normal appearance of the stomach. No pathologic  dilatation of the small bowel or colon.  Vascular/Lymphatic: No lymphadenopathy noted in the abdomen or  pelvis. Atherosclerosis throughout the abdominal and pelvic  vasculature, without evidence of aneurysm or dissection.  Reproductive: Prostate gland is unremarkable in appearance.  Postoperative changes in the right inguinal region related to prior  right orchiectomy.  Other: No significant volume of ascites. No pneumoperitoneum.  Musculoskeletal: There are no aggressive appearing lytic or blastic  lesions noted in the visualized portions of the skeleton.  IMPRESSION:  1. No findings to suggest metastatic disease in the abdomen or  pelvis.  2. Atherosclerosis, including at least 2 vessel coronary artery  disease. Assessment for potential risk factor modification, dietary  therapy or pharmacologic therapy may be warranted, if clinically  indicated.  3. Additional incidental findings, as above, similar to the prior  examination 07/20/2013.  Electronically Signed  By: Vinnie Langton M.D.  On: 10/24/2013 11:37   10/24/2013  CLINICAL DATA: Testicular cancer 2014. Rule out recurrence  EXAM:  CHEST 2 VIEW  COMPARISON: CXR 08/08/2009, chest CT 04/17/2013  FINDINGS:  The lungs are hyperinflated suggesting COPD. Negative for infiltrate  or effusion. Heart size upper normal. Negative for heart failure.  Prominent right hilum likely  due to pulmonary artery enlargement  from Pulmonary hypertension. This is similar to the prior chest CT  which also showed mild pulmonary artery enlargement.  Negative for mass or lung nodule.  IMPRESSION:  Probable COPD. Negative for metastatic disease.  Electronically Signed  By: Franchot Gallo M.D.  On: 10/24/2013 11:16    ASSESSMENT:  1. Stage I-B seminoma, status post right radical orchiectomy, normal PET scan  2. Degenerative joint disease.  3. Hypertension, on treatment.  4. Neutropenia, resolved  Patient Active Problem List   Diagnosis Date Noted  . Testicle cancer, right 12/20/2012     PLAN:  1. I personally reviewed and went over laboratory results with the patient.  The results are noted within this dictation. 2. I personally reviewed and went over radiographic studies with the patient.  The results are noted within this dictation. 3. Labs today and in 4 months: CBC diff, CMET, LDH, AFP, and HCG 4. CT abd/pelvis with contrast in 4 months 5. Chest xray in 4 months 6. Return in 4 months for follow-up and develop surveillance plan per NCCN guidelines for year #2.   THERAPY PLAN:  NCCN guidelines surveillance for Stage IB nonseminomatous testicular cancer recommends the following surveillance:  A. Year 1: H+P and markers every 2 months, abdominal/pelvic CT every 4-6 months, Chest X-ray at month 4 and 12.  2. Year 2: H+P and markers every 3 months, abdominal/pelvic CT every 6-12 months, chest x-ray annually.  3. Year 3: H+P and markers every 4-6 months, abdominal/pelvic CT annually, and chest x-ray annually.   4. Year 4: H+P and markers every 6 months and chest x-ray annually.  5. Year 5: H+P and markers annually and a chest x-ray annually.    All questions were answered. The patient knows to call the clinic with any problems, questions or concerns. We can certainly see the patient much sooner if necessary.  Patient and plan discussed with Dr. Farrel Gobble and he  is in agreement with the aforementioned.   Cariah Salatino 10/25/2013

## 2013-10-24 ENCOUNTER — Ambulatory Visit (HOSPITAL_COMMUNITY)
Admission: RE | Admit: 2013-10-24 | Discharge: 2013-10-24 | Disposition: A | Discharge status: Home / Self Care | Payer: Medicare HMO | Source: Ambulatory Visit | Attending: Oncology | Admitting: Oncology

## 2013-10-24 DIAGNOSIS — I2510 Atherosclerotic heart disease of native coronary artery without angina pectoris: Secondary | ICD-10-CM | POA: Diagnosis not present

## 2013-10-24 DIAGNOSIS — C6291 Malignant neoplasm of right testis, unspecified whether descended or undescended: Secondary | ICD-10-CM | POA: Diagnosis not present

## 2013-10-24 DIAGNOSIS — Z9079 Acquired absence of other genital organ(s): Secondary | ICD-10-CM | POA: Diagnosis not present

## 2013-10-24 DIAGNOSIS — Z08 Encounter for follow-up examination after completed treatment for malignant neoplasm: Secondary | ICD-10-CM | POA: Diagnosis not present

## 2013-10-24 LAB — POCT I-STAT CREATININE: Creatinine, Ser: 0.9 mg/dL (ref 0.50–1.35)

## 2013-10-24 MED ORDER — IOHEXOL 300 MG/ML  SOLN
100.0000 mL | Freq: Once | INTRAMUSCULAR | Status: AC | PRN
  Administered 2013-10-24: 100 mL via INTRAVENOUS

## 2013-10-25 ENCOUNTER — Encounter (HOSPITAL_BASED_OUTPATIENT_CLINIC_OR_DEPARTMENT_OTHER): Payer: Medicare HMO

## 2013-10-25 ENCOUNTER — Encounter (HOSPITAL_COMMUNITY): Payer: Self-pay | Admitting: Oncology

## 2013-10-25 ENCOUNTER — Encounter (HOSPITAL_COMMUNITY): Payer: Medicare HMO | Attending: Hematology and Oncology | Admitting: Oncology

## 2013-10-25 VITALS — BP 124/51 | HR 55 | Temp 97.9°F | Resp 16 | Wt 172.6 lb

## 2013-10-25 DIAGNOSIS — C6291 Malignant neoplasm of right testis, unspecified whether descended or undescended: Secondary | ICD-10-CM | POA: Diagnosis not present

## 2013-10-25 DIAGNOSIS — Z23 Encounter for immunization: Secondary | ICD-10-CM

## 2013-10-25 DIAGNOSIS — C6290 Malignant neoplasm of unspecified testis, unspecified whether descended or undescended: Secondary | ICD-10-CM

## 2013-10-25 DIAGNOSIS — I10 Essential (primary) hypertension: Secondary | ICD-10-CM

## 2013-10-25 LAB — COMPREHENSIVE METABOLIC PANEL
ALT: 12 U/L (ref 0–53)
AST: 19 U/L (ref 0–37)
Albumin: 4.2 g/dL (ref 3.5–5.2)
Alkaline Phosphatase: 58 U/L (ref 39–117)
Anion gap: 10 (ref 5–15)
BUN: 9 mg/dL (ref 6–23)
CHLORIDE: 106 meq/L (ref 96–112)
CO2: 27 meq/L (ref 19–32)
Calcium: 10.1 mg/dL (ref 8.4–10.5)
Creatinine, Ser: 0.75 mg/dL (ref 0.50–1.35)
GFR calc Af Amer: >90 mL/min (ref >90)
GFR, EST NON AFRICAN AMERICAN: 89 mL/min — AB (ref >90)
GLUCOSE: 102 mg/dL — AB (ref 70–99)
POTASSIUM: 4.3 meq/L (ref 3.7–5.3)
SODIUM: 143 meq/L (ref 137–147)
TOTAL PROTEIN: 7.3 g/dL (ref 6.0–8.3)
Total Bilirubin: 0.5 mg/dL (ref 0.3–1.2)

## 2013-10-25 LAB — CBC WITH DIFFERENTIAL/PLATELET
Basophils Absolute: 0 10*3/uL (ref 0.0–0.1)
Basophils Relative: 1 % (ref 0–1)
Eosinophils Absolute: 0.2 10*3/uL (ref 0.0–0.7)
Eosinophils Relative: 4 % (ref 0–5)
HCT: 37.8 % — ABNORMAL LOW (ref 39.0–52.0)
HEMOGLOBIN: 12.8 g/dL — AB (ref 13.0–17.0)
LYMPHS ABS: 1.4 10*3/uL (ref 0.7–4.0)
LYMPHS PCT: 30 % (ref 12–46)
MCH: 33.7 pg (ref 26.0–34.0)
MCHC: 33.9 g/dL (ref 30.0–36.0)
MCV: 99.5 fL (ref 78.0–100.0)
MONOS PCT: 10 % (ref 3–12)
Monocytes Absolute: 0.5 10*3/uL (ref 0.1–1.0)
NEUTROS PCT: 55 % (ref 43–77)
Neutro Abs: 2.5 10*3/uL (ref 1.7–7.7)
Platelets: 245 10*3/uL (ref 150–400)
RBC: 3.8 MIL/uL — AB (ref 4.22–5.81)
RDW: 13.6 % (ref 11.5–15.5)
WBC: 4.6 10*3/uL (ref 4.0–10.5)

## 2013-10-25 LAB — LACTATE DEHYDROGENASE: LDH: 179 U/L (ref 94–250)

## 2013-10-25 MED ORDER — INFLUENZA VAC SPLIT QUAD 0.5 ML IM SUSY
0.5000 mL | PREFILLED_SYRINGE | Freq: Once | INTRAMUSCULAR | Status: AC
  Administered 2013-10-25: 0.5 mL via INTRAMUSCULAR
  Filled 2013-10-25: qty 0.5

## 2013-10-25 NOTE — Patient Instructions (Signed)
[facility] Discharge Instructions  RECOMMENDATIONS MADE BY THE CONSULTANT AND ANY TEST RESULTS WILL BE SENT TO YOUR REFERRING PHYSICIAN.  EXAM FINDINGS BY THE PHYSICIAN TODAY AND SIGNS OR SYMPTOMS TO REPORT TO CLINIC OR PRIMARY PHYSICIAN: Exam and findings as discussed by Robynn Pane, PA-C.  Your scans are stable.  Report increased shortness of breath, unexplained weight loss or other problems. Will check labs and repeat scans in 4 months. Flu Vaccine today.   INSTRUCTIONS/FOLLOW-UP: Labs, scans and office visit in 4 months.  Thank you for choosing [facility] to provide your oncology and hematology care.  To afford each patient quality time with our providers, please arrive at least 15 minutes before your scheduled appointment time.  With your help, our goal is to use those 15 minutes to complete the necessary work-up to ensure our physicians have the information they need to help with your evaluation and healthcare recommendations.    Effective January 1st, 2014, we ask that you re-schedule your appointment with our physicians should you arrive 10 or more minutes late for your appointment.  We strive to give you quality time with our providers, and arriving late affects you and other patients whose appointments are after yours.    Again, thank you for choosing [facility].  Our hope is that these requests will decrease the amount of time that you wait before being seen by our physicians.       _____________________________________________________________  Should you have questions after your visit to [facility], please contact our office at [phone] between the hours of 8:30 a.m. and 4:30 p.m.  Voicemails left after 4:30 p.m. will not be returned until the following business day.  For prescription refill requests, have your pharmacy contact our office with your prescription refill request.     _______________________________________________________________  We hope that we have given you very good care.  You may receive a patient satisfaction survey in the mail, please complete it and return it as soon as possible.  We value your feedback!  _______________________________________________________________  Have you asked about our STAR program?  STAR stands for Survivorship Training and Rehabilitation, and this is a nationally recognized cancer care program that focuses on survivorship and rehabilitation.  Cancer and cancer treatments may cause problems, such as, pain, making you feel tired and keeping you from doing the things that you need or want to do. Cancer rehabilitation can help. Our goal is to reduce these troubling effects and help you have the best quality of life possible.  You may receive a survey from a nurse that asks questions about your current state of health.  Based on the survey results, all eligible patients will be referred to the [facility] program for an evaluation so we can better serve you!  A frequently asked questions sheet is available upon request.

## 2013-10-25 NOTE — Progress Notes (Signed)
Nathaniel Hicks presents today for injection per MD orders. Flu vaccine administered IM in left deltoid. Administration without incident. Patient tolerated well.

## 2013-10-25 NOTE — Progress Notes (Signed)
Labs for ldh,cbcd,cmp,afptm,hcgtm

## 2013-10-26 LAB — AFP TUMOR MARKER: AFP-Tumor Marker: 2.1 ng/mL

## 2013-10-28 LAB — BETA HCG QUANT (REF LAB)

## 2014-01-18 ENCOUNTER — Ambulatory Visit (HOSPITAL_COMMUNITY)
Admission: RE | Admit: 2014-01-18 | Discharge: 2014-01-18 | Disposition: A | Discharge status: Home / Self Care | Payer: Medicare HMO | Source: Ambulatory Visit | Attending: Family Medicine | Admitting: Family Medicine

## 2014-01-18 ENCOUNTER — Other Ambulatory Visit (HOSPITAL_COMMUNITY): Payer: Self-pay | Admitting: Family Medicine

## 2014-01-18 DIAGNOSIS — M19011 Primary osteoarthritis, right shoulder: Secondary | ICD-10-CM

## 2014-01-18 DIAGNOSIS — M25511 Pain in right shoulder: Secondary | ICD-10-CM | POA: Insufficient documentation

## 2014-02-15 ENCOUNTER — Other Ambulatory Visit (HOSPITAL_COMMUNITY): Payer: Self-pay

## 2014-02-15 DIAGNOSIS — C6290 Malignant neoplasm of unspecified testis, unspecified whether descended or undescended: Secondary | ICD-10-CM

## 2014-02-19 ENCOUNTER — Encounter (HOSPITAL_COMMUNITY): Payer: Medicare HMO | Attending: Hematology & Oncology

## 2014-02-19 DIAGNOSIS — C6290 Malignant neoplasm of unspecified testis, unspecified whether descended or undescended: Secondary | ICD-10-CM | POA: Insufficient documentation

## 2014-02-19 LAB — COMPREHENSIVE METABOLIC PANEL
ALK PHOS: 59 U/L (ref 39–117)
ALT: 15 U/L (ref 0–53)
ANION GAP: 9 (ref 5–15)
AST: 22 U/L (ref 0–37)
Albumin: 4.5 g/dL (ref 3.5–5.2)
BILIRUBIN TOTAL: 0.9 mg/dL (ref 0.3–1.2)
BUN: 11 mg/dL (ref 6–23)
CO2: 23 mmol/L (ref 19–32)
Calcium: 10 mg/dL (ref 8.4–10.5)
Chloride: 110 mmol/L (ref 96–112)
Creatinine, Ser: 0.76 mg/dL (ref 0.50–1.35)
GFR calc Af Amer: >90 mL/min (ref >90)
GFR calc non Af Amer: 88 mL/min — ABNORMAL LOW (ref >90)
Glucose, Bld: 112 mg/dL — ABNORMAL HIGH (ref 70–99)
Potassium: 3.7 mmol/L (ref 3.5–5.1)
Sodium: 142 mmol/L (ref 135–145)
Total Protein: 7.6 g/dL (ref 6.0–8.3)

## 2014-02-19 LAB — CBC WITH DIFFERENTIAL/PLATELET
BASOS ABS: 0 10*3/uL (ref 0.0–0.1)
Basophils Relative: 1 % (ref 0–1)
Eosinophils Absolute: 0.1 10*3/uL (ref 0.0–0.7)
Eosinophils Relative: 2 % (ref 0–5)
HCT: 40.6 % (ref 39.0–52.0)
Hemoglobin: 13.6 g/dL (ref 13.0–17.0)
LYMPHS ABS: 1.6 10*3/uL (ref 0.7–4.0)
LYMPHS PCT: 28 % (ref 12–46)
MCH: 33.3 pg (ref 26.0–34.0)
MCHC: 33.5 g/dL (ref 30.0–36.0)
MCV: 99.3 fL (ref 78.0–100.0)
MONO ABS: 0.6 10*3/uL (ref 0.1–1.0)
Monocytes Relative: 11 % (ref 3–12)
NEUTROS ABS: 3.4 10*3/uL (ref 1.7–7.7)
Neutrophils Relative %: 58 % (ref 43–77)
Platelets: 260 10*3/uL (ref 150–400)
RBC: 4.09 MIL/uL — ABNORMAL LOW (ref 4.22–5.81)
RDW: 13.5 % (ref 11.5–15.5)
WBC: 5.7 10*3/uL (ref 4.0–10.5)

## 2014-02-19 NOTE — Progress Notes (Signed)
Labs for cbcd,cmp,hcgtm,afptm

## 2014-02-20 LAB — AFP TUMOR MARKER: AFP-Tumor Marker: 1.7 ng/mL (ref 0.0–8.3)

## 2014-02-21 ENCOUNTER — Ambulatory Visit (HOSPITAL_COMMUNITY)
Admission: RE | Admit: 2014-02-21 | Discharge: 2014-02-21 | Disposition: A | Discharge status: Home / Self Care | Payer: Medicare HMO | Source: Ambulatory Visit | Attending: Oncology | Admitting: Oncology

## 2014-02-21 DIAGNOSIS — C6291 Malignant neoplasm of right testis, unspecified whether descended or undescended: Secondary | ICD-10-CM

## 2014-02-21 LAB — BETA HCG QUANT (REF LAB): Beta hCG, Tumor Marker: <2 m[IU]/mL (ref <5.0)

## 2014-02-21 MED ORDER — IOHEXOL 300 MG/ML  SOLN
100.0000 mL | Freq: Once | INTRAMUSCULAR | Status: AC | PRN
  Administered 2014-02-21: 100 mL via INTRAVENOUS

## 2014-02-26 ENCOUNTER — Ambulatory Visit (HOSPITAL_COMMUNITY): Payer: Medicare HMO | Admitting: Oncology

## 2014-03-11 NOTE — Assessment & Plan Note (Addendum)
Pure seminoma, stage I-b, status post right radical orchiectomy on 12/20/2012 opting for careful surveillance rather than adjuvant chemotherapy. Now following surveillance per NCCN guidelines for Stage I seminomatous testicular cancer following orchiectomy without adjuvant chemotherapy.  Will repeat CT abdomen/pelvis in 6 months.  Labs in 6 months: CBC diff, CMET.  Left groin pain resolved spontaneously without radiographic or clinical findings of abnormality.  Return in 6 months for follow-up at which time we will review labs and imaging tests and set-up repeat labs and CT of abdomen/pelvis to complete year 2 of surveillance.

## 2014-03-11 NOTE — Progress Notes (Signed)
Nathaniel Hicks, [name]  Testicle cancer, unspecified laterality - Plan: CBC with Differential, Comprehensive metabolic panel, CT Abdomen Pelvis W Contrast  CURRENT THERAPY: Intense surveillance after right radical orchiectomy on 12/21/2012 per NCCN guidelines.  INTERVAL HISTORY: Nathaniel Hicks 74 y.o. male returns for followup of pure seminoma, stage I-b, status post right radical orchiectomy opting for careful surveillance rather than adjuvant chemotherapy.     Testicle cancer, right   12/20/2012 Surgery Right total orchiectomyt- 1. Testis, biopsy, right - SEMINOMA. PLEASE SEE COMMENT. 2. Testis, tumor, right - SEMINOMA, 7.5 CM. - ANGIOLYMPHATIC INVASION PRESENT. - RESECTION MARGINS, NEGATIVE FOR ATYPIA OR MALIGNANCY.   I personally reviewed and went over laboratory results with the patient.  The results are noted within this dictation.  I personally reviewed and went over radiographic studies with the patient.  The results are noted within this dictation.    He reports a left groin discomfort that comes and goes and feels "similar to the discomfort I felt with my right testicular cancer."  Exam is benign.  The pain has resolved spontaneously.  CT does not demonstrate any cause for the pain.  Oncologically, he otherwise denies any complaints and ROS questioning is negative.  Past Medical History  Diagnosis Date  . Hypertension   . BPH (benign prostatic hyperplasia)   . HOH (hard of hearing)   . Arthritis   . Testicular cancer     2014    has Testicle cancer, right on his problem list.     has No Known Allergies.  Nathaniel Hicks does not currently have medications on file.  Past Surgical History  Procedure Laterality Date  . Scalp laceration repair      [facility]-Dr Tamala Julian  . Hemorroidectomy    . Lesion excision N/A 03/23/2012    Procedure: EXCISION NEOPLASM SCALP ;  Surgeon: Jamesetta So, MD;  Location: [facility];  Service:  General;  Laterality: N/A;  Excision of Scalp Neoplasm  . Colonoscopy N/A 11/24/2012    Procedure: COLONOSCOPY;  Surgeon: Rogene Houston, MD;  [facility];  Service: Endoscopy;  Laterality: N/A;  830-moved to [place] notified pt  . Orchiectomy Right 12/20/2012    Procedure: RIGHT RADICAL ORCHIECTOMY/POSSIBLE BX RIGHT TESTICLE;  Surgeon: Marissa Nestle, MD;  Location: [facility];  Service: Urology;  Laterality: Right;    Denies any headaches, dizziness, double vision, fevers, chills, night sweats, nausea, vomiting, diarrhea, constipation, chest pain, heart palpitations, shortness of breath, blood in stool, black tarry stool, urinary pain, urinary burning, urinary frequency, hematuria.   PHYSICAL EXAMINATION  ECOG PERFORMANCE STATUS: 1 - Symptomatic but completely ambulatory  There were no vitals filed for this visit.  GENERAL:alert, no distress, well nourished, well developed, comfortable, cooperative and smiling SKIN: skin color, texture, turgor are normal, no rashes or significant lesions HEAD: Normocephalic, No masses, lesions, tenderness or abnormalities EYES: normal, PERRLA, EOMI, Conjunctiva are pink and non-injected EARS: External ears normal OROPHARYNX:mucous membranes are moist  NECK: supple, no stridor, non-tender, trachea midline LYMPH:  no palpable lymphadenopathy BREAST:not examined LUNGS: clear to auscultation  HEART: regular rate & rhythm, no murmurs and no gallops ABDOMEN:abdomen soft, non-tender and normal bowel sounds BACK: Back symmetric, no curvature., No CVA tenderness EXTREMITIES:less then 2 second capillary refill, no joint deformities, effusion, or inflammation, no edema, no skin discoloration, no clubbing, no cyanosis  NEURO: alert & oriented x 3 with fluent speech, no focal  motor/sensory deficits, gait normal PELVIC:external genitalia without any abnormalities, testicular exam does not show any masses, lesions, or nodules, no inguinal adenopathy  noted, no identifiable inguinal or femoral hernia noted.   LABORATORY DATA: CBC    Component Value Date/Time   WBC 5.7 02/19/2014 0953   RBC 4.09* 02/19/2014 0953   HGB 13.6 02/19/2014 0953   HCT 40.6 02/19/2014 0953   PLT 260 02/19/2014 0953   MCV 99.3 02/19/2014 0953   MCH 33.3 02/19/2014 0953   MCHC 33.5 02/19/2014 0953   RDW 13.5 02/19/2014 0953   LYMPHSABS 1.6 02/19/2014 0953   MONOABS 0.6 02/19/2014 0953   EOSABS 0.1 02/19/2014 0953   BASOSABS 0.0 02/19/2014 0953      Chemistry      Component Value Date/Time   NA 142 02/19/2014 0953   K 3.7 02/19/2014 0953   CL 110 02/19/2014 0953   CO2 23 02/19/2014 0953   BUN 11 02/19/2014 0953   CREATININE 0.76 02/19/2014 0953      Component Value Date/Time   CALCIUM 10.0 02/19/2014 0953   ALKPHOS 59 02/19/2014 0953   AST 22 02/19/2014 0953   ALT 15 02/19/2014 0953   BILITOT 0.9 02/19/2014 0953     Results for SAAHIR, Nathaniel Hicks (MRN [id number]) as of 03/11/2014 20:27  Ref. Range 02/19/2014 09:53  AFP Tumor Marker Latest Range: 0.0-8.3 ng/mL 1.7  Beta hCG, Tumor Marker Latest Range: < 5.0 mIU/mL < 2.0     RADIOGRAPHIC STUDIES:  Dg Chest 2 View  02/21/2014   CLINICAL DATA:  Hypertension.  EXAM: CHEST  2 VIEW  COMPARISON:  10/24/2013, 08/08/2009.  FINDINGS: Mediastinum and hilar structures are normal. Left lower lobe mild subsegmental atelectasis and/or infiltrate. Cardiomegaly with normal pulmonary vascularity. No pneumothorax. No acute bony abnormality.  IMPRESSION: Left lower lobe mild atelectasis and/or infiltrate.   Electronically Signed   By: Marcello Moores  Register   On: 02/21/2014 13:24   Ct Abdomen Pelvis W Contrast  02/21/2014   CLINICAL DATA:  Testicular cancer treated surgically in 2015. No adjuvant therapy. Restaging. Subsequent encounter.  EXAM: CT ABDOMEN AND PELVIS WITH CONTRAST  TECHNIQUE: Multidetector CT imaging of the abdomen and pelvis was performed using the standard protocol following bolus administration of  intravenous contrast.  CONTRAST:  123mL OMNIPAQUE IOHEXOL 300 MG/ML  SOLN  COMPARISON:  CTs 10/24/2013 and 07/30/2013.  FINDINGS: Lower chest: Stable dependent atelectasis or scarring in both lung bases. No significant pleural or pericardial effusion.  Hepatobiliary: The liver is normal in density without focal abnormality. No evidence of gallstones, gallbladder wall thickening or biliary dilatation.  Pancreas: Unremarkable. No pancreatic ductal dilatation or surrounding inflammatory changes.  Spleen: Normal in size without focal abnormality.  Adrenals/Urinary Tract: Both adrenal glands appear normal.There is stable perinephric soft tissue stranding bilaterally. 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 A small cyst in the interpolar region of the right kidney appears unchanged. The left kidney appears normal. No evidence of urinary tract calculus. The bladder is nearly empty and suboptimally evaluated but grossly unremarkable.  Stomach/Bowel: No evidence of bowel wall thickening, distention or surrounding inflammatory change.The appendix appears normal.  Vascular/Lymphatic: There are no enlarged abdominal or pelvic lymph nodes. Stable atherosclerosis of the aorta, iliac and femoral arteries.  Reproductive: Apparent previous right orchectomy. Stable enlargement of the prostate gland. The seminal vesicles appear normal.  Other: No evidence of abdominal wall mass or hernia.  Musculoskeletal: No acute or significant osseous  findings. Stable lumbar spondylosis.  IMPRESSION: 1. Stable abdominal pelvic CT status post right orchectomy. No evidence of adenopathy or other metastatic disease. 2. 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 3. Atherosclerosis as previously described.   Electronically Signed   By: Richardean Sale M.D.   On: 02/21/2014 11:31      ASSESSMENT AND PLAN:  Testicle cancer, right Pure  seminoma, stage I-b, status post right radical orchiectomy on 12/20/2012 opting for careful surveillance rather than adjuvant chemotherapy. Now following surveillance per NCCN guidelines for Stage I seminomatous testicular cancer following orchiectomy without adjuvant chemotherapy.  Will repeat CT abdomen/pelvis in 6 months.  Labs in 6 months: CBC diff.  Return in 6 months for follow-up at which time we will review labs and imaging tests and set-up repeat labs and CT of abdomen/pelvis to complete year 2 of surveillance.    THERAPY PLAN:  NCCN guidelines surveillance for Stage I seminomatous testicular cancer after orchiectomy without adjuvant chemotherapy recommends the following surveillance:  A. Year 1: H+P every 3-6 months, abdominal/pelvic CT at 3, 6, 12 months, Chest X-ray as clinically indicated.   2. Year 2: H+P every 6-12 months, abdominal/pelvic CT every 6-12 months, chest x-ray as clinically indicated.  3. Year 3:H+P every 6-12 months, abdominal/pelvic CT every 6-12 months, chest x-ray as clinically indicated. 4. Year 4: H+P every 12 months, abdominal/pelvis CT every 12-24 months, and chest x-ray as clinically indicated.  5. Year 5:  H+P every 12 months, abdominal/pelvis CT every 12-24 months, and chest x-ray as clinically indicated.  All questions were answered. The patient knows to call the clinic with any problems, questions or concerns. We can certainly see the patient much sooner if necessary.  Patient and plan discussed with Dr. Ancil Linsey and she is in agreement with the aforementioned.   This note is electronically signed by: Robynn Pane 03/11/2014 8:42 PM

## 2014-03-12 ENCOUNTER — Encounter (HOSPITAL_BASED_OUTPATIENT_CLINIC_OR_DEPARTMENT_OTHER): Payer: Medicare HMO | Admitting: Oncology

## 2014-03-12 ENCOUNTER — Encounter (HOSPITAL_COMMUNITY): Payer: Self-pay | Admitting: Oncology

## 2014-03-12 VITALS — BP 138/62 | HR 64 | Temp 98.0°F | Resp 18 | Wt 167.8 lb

## 2014-03-12 DIAGNOSIS — C6291 Malignant neoplasm of right testis, unspecified whether descended or undescended: Secondary | ICD-10-CM | POA: Diagnosis not present

## 2014-03-12 DIAGNOSIS — C6290 Malignant neoplasm of unspecified testis, unspecified whether descended or undescended: Secondary | ICD-10-CM

## 2014-03-12 NOTE — Patient Instructions (Signed)
[place] at [facility] Discharge Instructions  RECOMMENDATIONS MADE BY THE CONSULTANT AND ANY TEST RESULTS WILL BE SENT TO YOUR REFERRING PHYSICIAN.  Return in 6 months for lab work, CT scan and MD appointment. Report any issues/concerns to clinic as needed prior to appointments.  Thank you for choosing [place] at [facility] to provide your oncology and hematology care.  To afford each patient quality time with our provider, please arrive at least 15 minutes before your scheduled appointment time.    You need to re-schedule your appointment should you arrive 10 or more minutes late.  We strive to give you quality time with our providers, and arriving late affects you and other patients whose appointments are after yours.  Also, if you no show three or more times for appointments you may be dismissed from the clinic at the providers discretion.     Again, thank you for choosing [facility].  Our hope is that these requests will decrease the amount of time that you wait before being seen by our physicians.       _____________________________________________________________  Should you have questions after your visit to [facility], please contact our office at [phone] between the hours of 8:30 a.m. and 4:30 p.m.  Voicemails left after 4:30 p.m. will not be returned until the following business day.  For prescription refill requests, have your pharmacy contact our office.

## 2014-09-10 ENCOUNTER — Other Ambulatory Visit (HOSPITAL_COMMUNITY): Payer: Self-pay | Admitting: Oncology

## 2014-09-10 ENCOUNTER — Ambulatory Visit (HOSPITAL_COMMUNITY)
Admission: RE | Admit: 2014-09-10 | Discharge: 2014-09-10 | Disposition: A | Discharge status: Home / Self Care | Payer: Medicare HMO | Source: Ambulatory Visit | Attending: Oncology | Admitting: Oncology

## 2014-09-10 ENCOUNTER — Encounter (HOSPITAL_COMMUNITY): Payer: Medicare HMO | Attending: Oncology

## 2014-09-10 ENCOUNTER — Encounter (HOSPITAL_COMMUNITY): Payer: Self-pay

## 2014-09-10 DIAGNOSIS — C6290 Malignant neoplasm of unspecified testis, unspecified whether descended or undescended: Secondary | ICD-10-CM

## 2014-09-10 DIAGNOSIS — Z9079 Acquired absence of other genital organ(s): Secondary | ICD-10-CM | POA: Insufficient documentation

## 2014-09-10 DIAGNOSIS — Z08 Encounter for follow-up examination after completed treatment for malignant neoplasm: Secondary | ICD-10-CM | POA: Insufficient documentation

## 2014-09-10 DIAGNOSIS — N200 Calculus of kidney: Secondary | ICD-10-CM | POA: Insufficient documentation

## 2014-09-10 DIAGNOSIS — C6291 Malignant neoplasm of right testis, unspecified whether descended or undescended: Secondary | ICD-10-CM | POA: Diagnosis not present

## 2014-09-10 LAB — COMPREHENSIVE METABOLIC PANEL
ALBUMIN: 4.8 g/dL (ref 3.5–5.0)
ALT: 15 U/L — AB (ref 17–63)
AST: 21 U/L (ref 15–41)
Alkaline Phosphatase: 62 U/L (ref 38–126)
Anion gap: 7 (ref 5–15)
BILIRUBIN TOTAL: 0.9 mg/dL (ref 0.3–1.2)
BUN: 12 mg/dL (ref 6–20)
CALCIUM: 10 mg/dL (ref 8.9–10.3)
CO2: 27 mmol/L (ref 22–32)
CREATININE: 0.92 mg/dL (ref 0.61–1.24)
Chloride: 103 mmol/L (ref 101–111)
GFR calc Af Amer: >60 mL/min (ref >60)
GLUCOSE: 109 mg/dL — AB (ref 65–99)
Potassium: 4.5 mmol/L (ref 3.5–5.1)
Sodium: 137 mmol/L (ref 135–145)
Total Protein: 8.1 g/dL (ref 6.5–8.1)

## 2014-09-10 LAB — CBC WITH DIFFERENTIAL/PLATELET
Basophils Absolute: 0 10*3/uL (ref 0.0–0.1)
Basophils Relative: 1 % (ref 0–1)
Eosinophils Absolute: 0.1 10*3/uL (ref 0.0–0.7)
Eosinophils Relative: 2 % (ref 0–5)
HEMATOCRIT: 42.2 % (ref 39.0–52.0)
Hemoglobin: 14.2 g/dL (ref 13.0–17.0)
LYMPHS PCT: 18 % (ref 12–46)
Lymphs Abs: 1.2 10*3/uL (ref 0.7–4.0)
MCH: 34.1 pg — ABNORMAL HIGH (ref 26.0–34.0)
MCHC: 33.6 g/dL (ref 30.0–36.0)
MCV: 101.2 fL — AB (ref 78.0–100.0)
MONO ABS: 0.7 10*3/uL (ref 0.1–1.0)
Monocytes Relative: 11 % (ref 3–12)
NEUTROS PCT: 68 % (ref 43–77)
Neutro Abs: 4.5 10*3/uL (ref 1.7–7.7)
Platelets: 251 10*3/uL (ref 150–400)
RBC: 4.17 MIL/uL — ABNORMAL LOW (ref 4.22–5.81)
RDW: 13.3 % (ref 11.5–15.5)
WBC: 6.5 10*3/uL (ref 4.0–10.5)

## 2014-09-10 MED ORDER — IOHEXOL 300 MG/ML  SOLN
100.0000 mL | Freq: Once | INTRAMUSCULAR | Status: AC | PRN
  Administered 2014-09-10: 100 mL via INTRAVENOUS

## 2014-09-11 NOTE — Progress Notes (Signed)
LABS DRAWN

## 2014-09-13 ENCOUNTER — Encounter (HOSPITAL_COMMUNITY): Payer: Self-pay | Admitting: Oncology

## 2014-09-13 ENCOUNTER — Encounter (HOSPITAL_COMMUNITY): Payer: Medicare HMO | Attending: Oncology | Admitting: Oncology

## 2014-09-13 ENCOUNTER — Ambulatory Visit (HOSPITAL_COMMUNITY): Payer: Medicare HMO | Admitting: Oncology

## 2014-09-13 VITALS — BP 121/52 | HR 89 | Temp 98.5°F | Resp 14 | Wt 173.0 lb

## 2014-09-13 DIAGNOSIS — C6291 Malignant neoplasm of right testis, unspecified whether descended or undescended: Secondary | ICD-10-CM | POA: Diagnosis not present

## 2014-09-13 DIAGNOSIS — C6290 Malignant neoplasm of unspecified testis, unspecified whether descended or undescended: Secondary | ICD-10-CM | POA: Insufficient documentation

## 2014-09-13 NOTE — Assessment & Plan Note (Signed)
Pure seminoma, stage I-b, status post right radical orchiectomy opting for careful surveillance rather than adjuvant chemotherapy.   I personally reviewed and went over radiographic studies with the patient.  The results are noted within this dictation.    Labs in 6 months: CBC diff, CMET, AFP, HCG  CT Abd/pelvis with contrast in 6 months for continued surveillance per NCCN guidelines.  Return in 6 months for follow-up.

## 2014-09-13 NOTE — Patient Instructions (Signed)
[place] at [facility] Discharge Instructions  RECOMMENDATIONS MADE BY THE CONSULTANT AND ANY TEST RESULTS WILL BE SENT TO YOUR REFERRING PHYSICIAN.  Exam and discussion by Robynn Pane, PA-C Your scans were good. Call with unexplained weight loss, uncontrolled pain or other concerns. Labs and CT Scans of abdomen and pelvis in 6 months Office visit after scans in 6 months.  Thank you for choosing [place] at [facility] to provide your oncology and hematology care.  To afford each patient quality time with our provider, please arrive at least 15 minutes before your scheduled appointment time.    You need to re-schedule your appointment should you arrive 10 or more minutes late.  We strive to give you quality time with our providers, and arriving late affects you and other patients whose appointments are after yours.  Also, if you no show three or more times for appointments you may be dismissed from the clinic at the providers discretion.     Again, thank you for choosing [facility].  Our hope is that these requests will decrease the amount of time that you wait before being seen by our physicians.       _____________________________________________________________  Should you have questions after your visit to [facility], please contact our office at [phone] between the hours of 8:30 a.m. and 4:30 p.m.  Voicemails left after 4:30 p.m. will not be returned until the following business day.  For prescription refill requests, have your pharmacy contact our office.

## 2014-09-13 NOTE — Progress Notes (Signed)
Nathaniel Curet, MD [place] [address]  Testicle cancer, right - Plan: CT Abdomen Pelvis W Contrast, CBC with Differential, Comprehensive metabolic panel, AFP tumor marker, HCG, tumor marker  CURRENT THERAPY: Intense surveillance after right radical orchiectomy on 12/21/2012 per NCCN guidelines.  INTERVAL HISTORY: Nathaniel Hicks 74 y.o. male returns for followup of pure seminoma, stage I-b, status post right radical orchiectomy opting for careful surveillance rather than adjuvant chemotherapy.     Testicle cancer, right   12/20/2012 Surgery Right total orchiectomyt- 1. Testis, biopsy, right - SEMINOMA. PLEASE SEE COMMENT. 2. Testis, tumor, right - SEMINOMA, 7.5 CM. - ANGIOLYMPHATIC INVASION PRESENT. - RESECTION MARGINS, NEGATIVE FOR ATYPIA OR MALIGNANCY.   I personally reviewed and went over laboratory results with the patient.  The results are noted within this dictation.  I personally reviewed and went over radiographic studies with the patient.  The results are noted within this dictation.  Ct of abd/pelvis is benign in appearance without any evidence for recurrent disease.  He denies any complaints    Past Medical History  Diagnosis Date  . Hypertension   . BPH (benign prostatic hyperplasia)   . HOH (hard of hearing)   . Arthritis   . Testicular cancer     2014    has Testicle cancer, right on his problem list.     has No Known Allergies.  Nathaniel Hicks does not currently have medications on file.  Past Surgical History  Procedure Laterality Date  . Scalp laceration repair      [facility]-Dr Tamala Julian  . Hemorroidectomy    . Lesion excision N/A 03/23/2012    Procedure: EXCISION NEOPLASM SCALP ;  Surgeon: Jamesetta So, MD;  Location: [facility] ORS;  Service: General;  Laterality: N/A;  Excision of Scalp Neoplasm  . Colonoscopy N/A 11/24/2012    Procedure: COLONOSCOPY;  Surgeon: Rogene Houston, MD;  Location: [facility] END[facility] SU[facility];  Service: Endoscopy;   Laterality: N/A;  830-moved to [place] notified pt  . Orchiectomy Right 12/20/2012    Procedure: RIGHT RADICAL ORCHIECTOMY/POSSIBLE BX RIGHT TESTICLE;  Surgeon: Marissa Nestle, MD;  Location: [facility] ORS;  Service: Urology;  Laterality: Right;    Denies any headaches, dizziness, double vision, fevers, chills, night sweats, nausea, vomiting, diarrhea, constipation, chest pain, heart palpitations, shortness of breath, blood in stool, black tarry stool, urinary pain, urinary burning, urinary frequency, hematuria.   PHYSICAL EXAMINATION  ECOG PERFORMANCE STATUS: 1 - Symptomatic but completely ambulatory  Filed Vitals:   09/13/14 1356  BP: 121/52  Pulse: 89  Temp: 98.5 F (36.9 C)  Resp: 14    GENERAL:alert, no distress, well nourished, well developed, comfortable, cooperative and smiling SKIN: skin color, texture, turgor are normal, no rashes or significant lesions HEAD: Normocephalic, No masses, lesions, tenderness or abnormalities EYES: normal, PERRLA, EOMI, Conjunctiva are pink and non-injected EARS: External ears normal OROPHARYNX:mucous membranes are moist  NECK: supple, no stridor, non-tender, trachea midline LYMPH:  no palpable lymphadenopathy BREAST:not examined LUNGS: clear to auscultation  HEART: regular rate & rhythm, no murmurs and no gallops ABDOMEN:abdomen soft, non-tender and normal bowel sounds BACK: Back symmetric, no curvature., No CVA tenderness EXTREMITIES:less then 2 second capillary refill, no joint deformities, effusion, or inflammation, no edema, no skin discoloration, no clubbing, no cyanosis  NEURO: alert & oriented x 3 with fluent speech, no focal motor/sensory deficits, gait normal PELVIC: external genitalia without any abnormalities, testicular exam does not show any masses,  lesions, or nodules, no inguinal adenopathy noted, no identifiable inguinal or femoral hernia noted.   LABORATORY DATA: CBC    Component Value Date/Time   WBC 6.5 09/10/2014 0947    RBC 4.17* 09/10/2014 0947   HGB 14.2 09/10/2014 0947   HCT 42.2 09/10/2014 0947   PLT 251 09/10/2014 0947   MCV 101.2* 09/10/2014 0947   MCH 34.1* 09/10/2014 0947   MCHC 33.6 09/10/2014 0947   RDW 13.3 09/10/2014 0947   LYMPHSABS 1.2 09/10/2014 0947   MONOABS 0.7 09/10/2014 0947   EOSABS 0.1 09/10/2014 0947   BASOSABS 0.0 09/10/2014 0947      Chemistry      Component Value Date/Time   NA 137 09/10/2014 0947   K 4.5 09/10/2014 0947   CL 103 09/10/2014 0947   CO2 27 09/10/2014 0947   BUN 12 09/10/2014 0947   CREATININE 0.92 09/10/2014 0947      Component Value Date/Time   CALCIUM 10.0 09/10/2014 0947   ALKPHOS 62 09/10/2014 0947   AST 21 09/10/2014 0947   ALT 15* 09/10/2014 0947   BILITOT 0.9 09/10/2014 0947     Results for Nathaniel Hicks, Nathaniel Hicks (MRN [id number]) as of 03/11/2014 20:27  Ref. Range 02/19/2014 09:53  AFP Tumor Marker Latest Range: 0.0-8.3 ng/mL 1.7  Beta hCG, Tumor Marker Latest Range: < 5.0 mIU/mL < 2.0     RADIOGRAPHIC STUDIES:  Ct Abdomen Pelvis W Contrast  09/10/2014   CLINICAL DATA:  Follow-up stage I testicular cancer status post right orchiectomy 12/2012  EXAM: CT ABDOMEN AND PELVIS WITH CONTRAST  TECHNIQUE: Multidetector CT imaging of the abdomen and pelvis was performed using the standard protocol following bolus administration of intravenous contrast.  CONTRAST:  158mL OMNIPAQUE IOHEXOL 300 MG/ML  SOLN  COMPARISON:  02/21/2014  FINDINGS: Lower chest:  Mild dependent atelectasis the lung bases.  Hepatobiliary: Liver is within normal limits. No suspicious/enhancing hepatic lesions.  Gallbladder is unremarkable. No intrahepatic or extrahepatic ductal dilatation.  Pancreas: Within normal limits.  Spleen: Within normal limits.  Adrenals/Urinary Tract: Adrenal glands are within normal limits.  9 mm cyst in the lateral right upper kidney (series 2/ image 32). 3 mm nonobstructing right upper pole renal calculus (series 2/image 30).  Left kidney is within normal limits.   No hydronephrosis.  Bladder is underdistended but unremarkable.  Stomach/Bowel: Stomach is notable for a small hiatal hernia.  No evidence of bowel obstruction.  Normal appendix.  Vascular/Lymphatic: Mild atherosclerotic calcifications abdominal aorta and branch vessels.  No suspicious abdominopelvic lymphadenopathy. Specifically, no retroperitoneal lymphadenopathy.  Reproductive: Mild prostatomegaly.  Other: No abdominopelvic ascites.  Status post right orchiectomy.  Musculoskeletal: Degenerative changes of the visualized thoracolumbar spine.  IMPRESSION: Status post right orchiectomy.  No evidence of metastatic disease.  3 mm nonobstructing right upper pole renal calculus. No hydronephrosis.  Additional ancillary findings as above.   Electronically Signed   By: Julian Hy M.D.   On: 09/10/2014 13:19      ASSESSMENT AND PLAN:  Testicle cancer, right Pure seminoma, stage I-b, status post right radical orchiectomy opting for careful surveillance rather than adjuvant chemotherapy.   I personally reviewed and went over radiographic studies with the patient.  The results are noted within this dictation.    Labs in 6 months: CBC diff, CMET, AFP, HCG  CT Abd/pelvis with contrast in 6 months for continued surveillance per NCCN guidelines.  Return in 6 months for follow-up.     THERAPY PLAN:  NCCN guidelines surveillance for Stage  I seminomatous testicular cancer after orchiectomy without adjuvant chemotherapy recommends the following surveillance:  A. Year 1: H+P every 3-6 months, abdominal/pelvic CT at 3, 6, 12 months, Chest X-ray as clinically indicated.   2. Year 2: H+P every 6-12 months, abdominal/pelvic CT every 6-12 months, chest x-ray as clinically indicated.  3. Year 3:H+P every 6-12 months, abdominal/pelvic CT every 6-12 months, chest x-ray as clinically indicated.   4. Year 4: H+P every 12 months, abdominal/pelvis CT every 12-24 months, and chest x-ray as clinically indicated.  5.  Year 5:  H+P every 12 months, abdominal/pelvis CT every 12-24 months, and chest x-ray as clinically indicated.  All questions were answered. The patient knows to call the clinic with any problems, questions or concerns. We can certainly see the patient much sooner if necessary.  Patient and plan discussed with Dr. Ancil Linsey and she is in agreement with the aforementioned.   This note is electronically signed by: Robynn Pane 09/13/2014 4:49 PM

## 2015-03-14 ENCOUNTER — Ambulatory Visit (HOSPITAL_COMMUNITY)
Admission: RE | Admit: 2015-03-14 | Discharge: 2015-03-14 | Disposition: A | Discharge status: Home / Self Care | Payer: Medicare Other | Source: Ambulatory Visit | Attending: Oncology | Admitting: Oncology

## 2015-03-14 DIAGNOSIS — C6291 Malignant neoplasm of right testis, unspecified whether descended or undescended: Secondary | ICD-10-CM | POA: Diagnosis not present

## 2015-03-14 DIAGNOSIS — N400 Enlarged prostate without lower urinary tract symptoms: Secondary | ICD-10-CM | POA: Diagnosis not present

## 2015-03-14 LAB — POCT I-STAT CREATININE: Creatinine, Ser: 0.8 mg/dL (ref 0.61–1.24)

## 2015-03-14 MED ORDER — IOHEXOL 300 MG/ML  SOLN
100.0000 mL | Freq: Once | INTRAMUSCULAR | Status: AC | PRN
  Administered 2015-03-14: 100 mL via INTRAVENOUS

## 2015-03-15 ENCOUNTER — Encounter (HOSPITAL_COMMUNITY): Payer: Medicare Other | Attending: Hematology & Oncology

## 2015-03-15 ENCOUNTER — Encounter (HOSPITAL_BASED_OUTPATIENT_CLINIC_OR_DEPARTMENT_OTHER): Payer: Medicare Other | Admitting: Oncology

## 2015-03-15 ENCOUNTER — Ambulatory Visit (HOSPITAL_COMMUNITY): Payer: Medicare HMO | Admitting: Hematology & Oncology

## 2015-03-15 ENCOUNTER — Encounter (HOSPITAL_COMMUNITY): Payer: Self-pay | Admitting: Oncology

## 2015-03-15 VITALS — BP 139/56 | HR 64 | Temp 98.0°F | Resp 16 | Wt 186.0 lb

## 2015-03-15 DIAGNOSIS — N400 Enlarged prostate without lower urinary tract symptoms: Secondary | ICD-10-CM | POA: Diagnosis not present

## 2015-03-15 DIAGNOSIS — Z9079 Acquired absence of other genital organ(s): Secondary | ICD-10-CM | POA: Insufficient documentation

## 2015-03-15 DIAGNOSIS — C6291 Malignant neoplasm of right testis, unspecified whether descended or undescended: Secondary | ICD-10-CM | POA: Insufficient documentation

## 2015-03-15 DIAGNOSIS — I10 Essential (primary) hypertension: Secondary | ICD-10-CM | POA: Diagnosis not present

## 2015-03-15 DIAGNOSIS — Z8547 Personal history of malignant neoplasm of testis: Secondary | ICD-10-CM | POA: Diagnosis not present

## 2015-03-15 DIAGNOSIS — Z9889 Other specified postprocedural states: Secondary | ICD-10-CM | POA: Diagnosis not present

## 2015-03-15 LAB — CBC WITH DIFFERENTIAL/PLATELET
Basophils Absolute: 0 10*3/uL (ref 0.0–0.1)
Basophils Relative: 1 %
EOS ABS: 0.2 10*3/uL (ref 0.0–0.7)
Eosinophils Relative: 5 %
HCT: 37.9 % — ABNORMAL LOW (ref 39.0–52.0)
HEMOGLOBIN: 12.7 g/dL — AB (ref 13.0–17.0)
LYMPHS ABS: 1.1 10*3/uL (ref 0.7–4.0)
LYMPHS PCT: 22 %
MCH: 33.5 pg (ref 26.0–34.0)
MCHC: 33.5 g/dL (ref 30.0–36.0)
MCV: 100 fL (ref 78.0–100.0)
Monocytes Absolute: 0.5 10*3/uL (ref 0.1–1.0)
Monocytes Relative: 10 %
NEUTROS ABS: 2.9 10*3/uL (ref 1.7–7.7)
NEUTROS PCT: 62 %
Platelets: 215 10*3/uL (ref 150–400)
RBC: 3.79 MIL/uL — AB (ref 4.22–5.81)
RDW: 13.6 % (ref 11.5–15.5)
WBC: 4.7 10*3/uL (ref 4.0–10.5)

## 2015-03-15 LAB — COMPREHENSIVE METABOLIC PANEL
ALK PHOS: 49 U/L (ref 38–126)
ALT: 15 U/L — AB (ref 17–63)
AST: 23 U/L (ref 15–41)
Albumin: 4.3 g/dL (ref 3.5–5.0)
Anion gap: 5 (ref 5–15)
BUN: 13 mg/dL (ref 6–20)
CALCIUM: 9.6 mg/dL (ref 8.9–10.3)
CO2: 27 mmol/L (ref 22–32)
CREATININE: 0.85 mg/dL (ref 0.61–1.24)
Chloride: 109 mmol/L (ref 101–111)
GFR calc non Af Amer: >60 mL/min (ref >60)
Glucose, Bld: 91 mg/dL (ref 65–99)
Potassium: 4.2 mmol/L (ref 3.5–5.1)
SODIUM: 141 mmol/L (ref 135–145)
Total Bilirubin: 0.6 mg/dL (ref 0.3–1.2)
Total Protein: 7.2 g/dL (ref 6.5–8.1)

## 2015-03-15 MED ORDER — INFLUENZA VAC SPLIT QUAD 0.5 ML IM SUSY: 0.5000 mL | PREFILLED_SYRINGE | Freq: Once | INTRAMUSCULAR | Status: DC

## 2015-03-15 NOTE — Progress Notes (Signed)
Nathaniel Hicks, [place] [address]  Malignant neoplasm of right testis, unspecified whether descended or undescended [facility]) - Plan: Influenza vac split quadrivalent PF (FLUARIX) injection 0.5 mL, CBC with Differential, Comprehensive metabolic panel, AFP tumor marker, HCG, tumor marker, CT Abdomen Pelvis W Contrast, DG Chest 2 View  CURRENT THERAPY: Intense surveillance after right radical orchiectomy on 12/21/2012 per NCCN guidelines.  INTERVAL HISTORY: Nathaniel Hicks 75 y.o. male returns for followup of pure seminoma, stage I-b, status post right radical orchiectomy opting for careful surveillance rather than adjuvant chemotherapy.     Testicle cancer, right   12/20/2012 Surgery Right total orchiectomyt- 1. Testis, biopsy, right - SEMINOMA. PLEASE SEE COMMENT. 2. Testis, tumor, right - SEMINOMA, 7.5 CM. - ANGIOLYMPHATIC INVASION PRESENT. - RESECTION MARGINS, NEGATIVE FOR ATYPIA OR MALIGNANCY.   01/18/2013 PET scan Status post right orchiectomy. No findings specific for metastatic disease. Small para-aortic nodes measuring up to 5 mm short axis, without convincing hypermetabolism. Given location, attention on follow-up is suggested.   04/17/2013 Imaging CT CAP- No evidence of metastatic disease in the chest, abdomen or pelvis. Proximal LAD coronary artery calcification.   07/20/2013 Imaging CT abd/pelvis- No evidence of metastatic disease in the abdomen or pelvis.   10/24/2013 Imaging CT abd/pelvis- No findings to suggest metastatic disease in the abdomen or pelvis   10/24/2013 Imaging Chest xray- Probable COPD. Negative for metastatic disease.   02/21/2014 Imaging CT abd/pelvis- Stable abdominal pelvic CT status post right orchectomy. No evidence of adenopathy or other metastatic disease.   02/21/2014 Imaging Chest xray- Left lower lobe mild atelectasis and/or infiltrate.    09/10/2014 Imaging CT abd/pelvis- Status post right orchiectomy.  No evidence of  metastatic disease.  3 mm nonobstructing right upper pole renal calculus. No hydronephrosis.   03/14/2015 Imaging CT abd/pelvis- Stable exam. No evidence of metastatic disease or other acute findings within the abdomen or pelvis.   I personally reviewed and went over laboratory results with the patient.  The results are noted within this dictation.  I personally reviewed and went over radiographic studies with the patient.  The results are noted within this dictation.  CT of abd/pelvis is benign in appearance without any evidence for recurrent disease.  He denies any complaints.     Past Medical History  Diagnosis Date  . Hypertension   . BPH (benign prostatic hyperplasia)   . HOH (hard of hearing)   . Arthritis   . Testicular cancer [facility])     2014    has Testicle cancer, right on his problem list.     has No Known Allergies.  Nathaniel Hicks had no medications administered during this visit.  Past Surgical History  Procedure Laterality Date  . Scalp laceration repair      [facility]-Dr Tamala Julian  . Hemorroidectomy    . Lesion excision N/A 03/23/2012    Procedure: EXCISION NEOPLASM SCALP ;  Surgeon: Jamesetta So, MD;  Location: [facility] ORS;  Service: General;  Laterality: N/A;  Excision of Scalp Neoplasm  . Colonoscopy N/A 11/24/2012    Procedure: COLONOSCOPY;  Surgeon: Rogene Houston, MD;  Location: [facility];  Service: Endoscopy;  Laterality: N/A;  830-moved to [place] notified pt  . Orchiectomy Right 12/20/2012    Procedure: RIGHT RADICAL ORCHIECTOMY/POSSIBLE BX RIGHT TESTICLE;  Surgeon: Marissa Nestle, MD;  Location: [facility] ORS;  Service: Urology;  Laterality: Right;    Denies any headaches, dizziness, double vision,  fevers, chills, night sweats, nausea, vomiting, diarrhea, constipation, chest pain, heart palpitations, shortness of breath, blood in stool, black tarry stool, urinary pain, urinary burning, urinary frequency, hematuria.   PHYSICAL EXAMINATION  ECOG PERFORMANCE STATUS: 1 -  Symptomatic but completely ambulatory  Filed Vitals:   03/15/15 1345  BP: 139/56  Pulse: 64  Temp: 98 F (36.7 C)  Resp: 16    GENERAL:alert, no distress, well nourished, well developed, comfortable, cooperative and smiling SKIN: skin color, texture, turgor are normal, no rashes or significant lesions HEAD: Normocephalic, No masses, lesions, tenderness or abnormalities EYES: normal, PERRLA, EOMI, Conjunctiva are pink and non-injected EARS: External ears normal OROPHARYNX:mucous membranes are moist  NECK: supple, no stridor, non-tender, trachea midline LYMPH:  no palpable lymphadenopathy BREAST:not examined LUNGS: clear to auscultation without wheezes, rales, or rhonchi.   HEART: regular rate & rhythm, no murmurs and no gallops ABDOMEN:abdomen soft, non-tender and normal bowel sounds BACK: Back symmetric, no curvature., No CVA tenderness EXTREMITIES:less then 2 second capillary refill, no joint deformities, effusion, or inflammation, no edema, no skin discoloration, no clubbing, no cyanosis  NEURO: alert & oriented x 3 with fluent speech, no focal motor/sensory deficits, gait normal PELVIC: External genitalia exam deferred today; previously, external genitalia without any abnormalities, testicular exam does not show any masses, lesions, or nodules, no inguinal adenopathy noted, no identifiable inguinal or femoral hernia noted.   LABORATORY DATA: CBC    Component Value Date/Time   WBC 4.7 03/15/2015 1321   RBC 3.79* 03/15/2015 1321   HGB 12.7* 03/15/2015 1321   HCT 37.9* 03/15/2015 1321   PLT 215 03/15/2015 1321   MCV 100.0 03/15/2015 1321   MCH 33.5 03/15/2015 1321   MCHC 33.5 03/15/2015 1321   RDW 13.6 03/15/2015 1321   LYMPHSABS 1.1 03/15/2015 1321   MONOABS 0.5 03/15/2015 1321   EOSABS 0.2 03/15/2015 1321   BASOSABS 0.0 03/15/2015 1321      Chemistry      Component Value Date/Time   NA 141 03/15/2015 1321   K 4.2 03/15/2015 1321   CL 109 03/15/2015 1321   CO2  27 03/15/2015 1321   BUN 13 03/15/2015 1321   CREATININE 0.85 03/15/2015 1321      Component Value Date/Time   CALCIUM 9.6 03/15/2015 1321   ALKPHOS 49 03/15/2015 1321   AST 23 03/15/2015 1321   ALT 15* 03/15/2015 1321   BILITOT 0.6 03/15/2015 1321     Results for MARQUAN, HAGIN (MRN [id number]) as of 03/11/2014 20:27  Ref. Range 02/19/2014 09:53  AFP Tumor Marker Latest Range: 0.0-8.3 ng/mL 1.7  Beta hCG, Tumor Marker Latest Range: < 5.0 mIU/mL < 2.0     RADIOGRAPHIC STUDIES:  Ct Abdomen Pelvis W Contrast  03/14/2015  CLINICAL DATA:  Followup right testicular seminomatous carcinoma. Previous right orchiectomy. EXAM: CT ABDOMEN AND PELVIS WITH CONTRAST TECHNIQUE: Multidetector CT imaging of the abdomen and pelvis was performed using the standard protocol following bolus administration of intravenous contrast. CONTRAST:  153mL OMNIPAQUE IOHEXOL 300 MG/ML  SOLN COMPARISON:  09/10/2014 FINDINGS: Lower chest:  No acute findings.  Stable mild cardiomegaly. Hepatobiliary: No masses or other significant abnormality. Gallbladder is unremarkable. Pancreas: No mass, inflammatory changes, or other significant abnormality. Spleen: Within normal limits in size and appearance. Adrenals/Urinary Tract: No masses identified. No evidence of hydronephrosis. Tiny sub-cm right upper pole renal cyst again seen as well as mild malrotation of right kidney. Stomach/Bowel: No evidence of obstruction, inflammatory process, or abnormal fluid collections. Vascular/Lymphatic: No pathologically  enlarged lymph nodes. No evidence of abdominal aortic aneurysm. Reproductive: Stable mildly enlarged prostate gland. Other: None. Musculoskeletal: No suspicious bone lesions identified. Degenerative lumbar spondylosis again noted. IMPRESSION: Stable exam. No evidence of metastatic disease or other acute findings within the abdomen or pelvis. Stable mildly enlarged prostate. Electronically Signed   By: Earle Gell M.D.   On: 03/14/2015  11:19      ASSESSMENT AND PLAN:  Testicle cancer, right Pure seminoma, stage IB, status post right radical orchiectomy opting for careful surveillance rather than adjuvant chemotherapy.   Oncology history is updated.  I personally reviewed and went over radiographic studies with the patient.  The results are noted within this dictation.    Labs in 6 months: CBC diff, CMET, AFP, HCG  CT Abd/pelvis with contrast and chest xray in 6 months continued surveillance per NCCN guidelines.  Return in 6 months for follow-up.  December 2017 is his 3 year anniversary mark from the time of resection.  Thus, 2018 we will follow year #4 surveillance recommendations  THERAPY PLAN:  NCCN guidelines surveillance for Stage I seminomatous testicular cancer after orchiectomy without adjuvant chemotherapy recommends the following surveillance:  A. Year 1: H+P every 3-6 months, abdominal/pelvic CT at 3, 6, 12 months, Chest X-ray as clinically indicated.   2. Year 2: H+P every 6-12 months, abdominal/pelvic CT every 6-12 months, chest x-ray as clinically indicated.  3. Year 3:H+P every 6-12 months, abdominal/pelvic CT every 6-12 months, chest x-ray as clinically indicated.   4. Year 4: H+P every 12 months, abdominal/pelvis CT every 12-24 months, and chest x-ray as clinically indicated.  5. Year 5:  H+P every 12 months, abdominal/pelvis CT every 12-24 months, and chest x-ray as clinically indicated.  All questions were answered. The patient knows to call the clinic with any problems, questions or concerns. We can certainly see the patient much sooner if necessary.  Patient and plan discussed with Dr. Ancil Linsey and she is in agreement with the aforementioned.   This note is electronically signed by: Robynn Pane 03/15/2015 5:20 PM

## 2015-03-15 NOTE — Patient Instructions (Addendum)
[place] at [facility] Discharge Instructions  RECOMMENDATIONS MADE BY THE CONSULTANT AND ANY TEST RESULTS WILL BE SENT TO YOUR REFERRING PHYSICIAN.  Exam and discussion today with Kirby Crigler, PA. Lab work in 6 months. CT scan of abdomen and pelvis in 6 months. Chest X-ray in 6 months.  Return for office visit in 6 months with Kirby Crigler, PA.   Thank you for choosing [place] at [facility] to provide your oncology and hematology care.  To afford each patient quality time with our provider, please arrive at least 15 minutes before your scheduled appointment time.   Beginning January 23rd 2017 lab work for the [facility] will be done in the  Main lab at [facility] on 1st floor. If you have a lab appointment with the [facility] please come in thru the  Main Entrance and check in at the main information desk  You need to re-schedule your appointment should you arrive 10 or more minutes late.  We strive to give you quality time with our providers, and arriving late affects you and other patients whose appointments are after yours.  Also, if you no show three or more times for appointments you may be dismissed from the clinic at the providers discretion.     Again, thank you for choosing [facility].  Our hope is that these requests will decrease the amount of time that you wait before being seen by our physicians.       _____________________________________________________________  Should you have questions after your visit to [facility], please contact our office at [phone] between the hours of 8:30 a.m. and 4:30 p.m.  Voicemails left after 4:30 p.m. will not be returned until the following business day.  For prescription refill requests, have your pharmacy contact our office.         Resources For Cancer Patients and their Caregivers ? American Cancer Society: Can assist with transportation,  wigs, general needs, runs Look Good Feel Better.        351-594-3697 ? Cancer Care: Provides financial assistance, online support groups, medication/co-pay assistance.  1-800-813-HOPE 936 096 4241) ? [place] Assists [place] cancer patients and their families through emotional , educational and financial support.  301-344-1260 ? [place] DSS Where to apply for food stamps, Medicaid and utility assistance. 3095125717 ? [facility]: Transportation to medical appointments. 343-714-5471 ? Social Security Administration: May apply for disability if have a Stage IV cancer. 443-611-6112 4751400443 ? [employer], Disability and Transit Services: Assists with nutrition, care and transit needs. 2600220150

## 2015-03-15 NOTE — Assessment & Plan Note (Addendum)
Pure seminoma, stage IB, status post right radical orchiectomy opting for careful surveillance rather than adjuvant chemotherapy.   Oncology history is updated.  I personally reviewed and went over radiographic studies with the patient.  The results are noted within this dictation.    Labs in 6 months: CBC diff, CMET, AFP, HCG  CT Abd/pelvis with contrast and chest xray in 6 months continued surveillance per NCCN guidelines.  Return in 6 months for follow-up.  December 2017 is his 3 year anniversary mark from the time of resection.  Thus, 2018 we will follow year #4 surveillance recommendations

## 2015-03-16 LAB — BETA HCG QUANT (REF LAB)

## 2015-03-16 LAB — AFP TUMOR MARKER: AFP-Tumor Marker: 2.1 ng/mL (ref 0.0–8.3)

## 2015-03-18 ENCOUNTER — Other Ambulatory Visit (HOSPITAL_COMMUNITY): Payer: Self-pay | Admitting: Emergency Medicine

## 2015-09-13 ENCOUNTER — Ambulatory Visit (HOSPITAL_COMMUNITY)
Admission: RE | Admit: 2015-09-13 | Discharge: 2015-09-13 | Disposition: A | Discharge status: Home / Self Care | Payer: Medicare Other | Source: Ambulatory Visit | Attending: Oncology | Admitting: Oncology

## 2015-09-13 DIAGNOSIS — M47896 Other spondylosis, lumbar region: Secondary | ICD-10-CM | POA: Diagnosis not present

## 2015-09-13 DIAGNOSIS — C6291 Malignant neoplasm of right testis, unspecified whether descended or undescended: Secondary | ICD-10-CM | POA: Diagnosis present

## 2015-09-13 DIAGNOSIS — I700 Atherosclerosis of aorta: Secondary | ICD-10-CM | POA: Diagnosis not present

## 2015-09-13 LAB — POCT I-STAT CREATININE: Creatinine, Ser: 0.8 mg/dL (ref 0.61–1.24)

## 2015-09-13 MED ORDER — IOPAMIDOL (ISOVUE-300) INJECTION 61%
100.0000 mL | Freq: Once | INTRAVENOUS | Status: AC | PRN
  Administered 2015-09-13: 100 mL via INTRAVENOUS

## 2015-09-17 ENCOUNTER — Ambulatory Visit (HOSPITAL_COMMUNITY): Payer: Medicare Other | Admitting: Oncology

## 2015-09-17 ENCOUNTER — Other Ambulatory Visit (HOSPITAL_COMMUNITY): Payer: Medicare Other

## 2015-09-17 NOTE — Progress Notes (Signed)
   ROS  This encounter was created in error - please disregard. 

## 2015-09-17 NOTE — Assessment & Plan Note (Deleted)
Pure seminoma, stage IB, status post right radical orchiectomy on 12/20/2012 opting for careful surveillance rather than adjuvant chemotherapy.   Oncology history is updated.  I personally reviewed and went over radiographic studies with the patient.  The results are noted within this dictation.  CT scan is negative for any evidence of recurrence.  Labs today.  CBC diff, CMET, AFP, HCG.  I personally reviewed and went over laboratory results with the patient.  The results are noted within this dictation.  Order is in for Chest Xray today.  Labs in 6 months: CBC diff, CMET, AFP, HCG  CT Abd/pelvis with contrast and chest xray in 6 months continued surveillance per NCCN guidelines.  Return in 6 months for follow-up, at which time we will transition to year #4 surveillance protocol per NCCN guidelines (annual follow-up with CT abd/pelvis).

## 2015-10-14 ENCOUNTER — Ambulatory Visit (HOSPITAL_COMMUNITY): Payer: Medicare Other | Admitting: Oncology

## 2015-10-24 ENCOUNTER — Encounter (HOSPITAL_COMMUNITY): Payer: Medicare Other | Attending: Oncology | Admitting: Oncology

## 2015-10-24 ENCOUNTER — Encounter (HOSPITAL_COMMUNITY): Payer: Self-pay

## 2015-10-24 VITALS — BP 128/51 | HR 67 | Temp 97.8°F | Resp 16 | Ht 71.0 in | Wt 182.0 lb

## 2015-10-24 DIAGNOSIS — C6291 Malignant neoplasm of right testis, unspecified whether descended or undescended: Secondary | ICD-10-CM

## 2015-10-24 NOTE — Patient Instructions (Signed)
[place] at [facility] Discharge Instructions  RECOMMENDATIONS MADE BY THE CONSULTANT AND ANY TEST RESULTS WILL BE SENT TO YOUR REFERRING PHYSICIAN.  You were seen today by Dr. Oliva Bustard. Follow up in 6 months for labs, CT, chest xray, and follow up visit.   Thank you for choosing [place] at [facility] to provide your oncology and hematology care.  To afford each patient quality time with our provider, please arrive at least 15 minutes before your scheduled appointment time.   Beginning January 23rd 2017 lab work for the [facility] will be done in the  Main lab at [facility] on 1st floor. If you have a lab appointment with the [facility] please come in thru the  Main Entrance and check in at the main information desk  You need to re-schedule your appointment should you arrive 10 or more minutes late.  We strive to give you quality time with our providers, and arriving late affects you and other patients whose appointments are after yours.  Also, if you no show three or more times for appointments you may be dismissed from the clinic at the providers discretion.     Again, thank you for choosing [facility].  Our hope is that these requests will decrease the amount of time that you wait before being seen by our physicians.       _____________________________________________________________  Should you have questions after your visit to [facility], please contact our office at [phone] between the hours of 8:30 a.m. and 4:30 p.m.  Voicemails left after 4:30 p.m. will not be returned until the following business day.  For prescription refill requests, have your pharmacy contact our office.         Resources For Cancer Patients and their Caregivers ? American Cancer Society: Can assist with transportation, wigs, general needs, runs Look Good Feel Better.        908-424-1796 ? Cancer Care: Provides  financial assistance, online support groups, medication/co-pay assistance.  1-800-813-HOPE 3317692995) ? [employer] [facility] [place] cancer patients and their families through emotional , educational and financial support.  (520) 390-4862 ? [place] DSS Where to apply for food stamps, Medicaid and utility assistance. 864-224-3758 ? [employer]: Transportation to medical appointments. 6518303193 ? Social Security Administration: May apply for disability if have a Stage IV cancer. (438)794-0904 479-680-4283 ? [employer], Disability and Transit Services: Assists with nutrition, care and transit needs. [place] Support Programs: @10RELATIVEDAYS @ > Cancer Support Group  2nd Tuesday of the month 1pm-2pm, Journey Room  > Creative Journey  3rd Tuesday of the month 1130am-1pm, Journey Room  > Look Good Feel Better  1st Wednesday of the month 10am-12 noon, Journey Room (Call [place] to register [phone])

## 2015-10-24 NOTE — Progress Notes (Signed)
Nathaniel Hicks, [place] [address]  No diagnosis found.  CURRENT THERAPY: Surveillance per NCCN guidelines.  INTERVAL HISTORY: Nathaniel Hicks 75 y.o. male returns for followup of Pure seminoma, stage IB, status post right radical orchiectomy on 12/20/2012 opting for careful surveillance rather than adjuvant chemotherapy.  Marland Kitchen  He does not voice any major complaint.  He had a CT scan which has been reviewed independently and no evidence of any abnormality was found     Testicle cancer, right   12/20/2012 Surgery    Right total orchiectomyt- 1. Testis, biopsy, right - SEMINOMA. PLEASE SEE COMMENT. 2. Testis, tumor, right - SEMINOMA, 7.5 CM. - ANGIOLYMPHATIC INVASION PRESENT. - RESECTION MARGINS, NEGATIVE FOR ATYPIA OR MALIGNANCY.      01/18/2013 PET scan    Status post right orchiectomy. No findings specific for metastatic disease. Small para-aortic nodes measuring up to 5 mm short axis, without convincing hypermetabolism. Given location, attention on follow-up is suggested.      04/17/2013 Imaging    CT CAP- No evidence of metastatic disease in the chest, abdomen or pelvis. Proximal LAD coronary artery calcification.      07/20/2013 Imaging    CT abd/pelvis- No evidence of metastatic disease in the abdomen or pelvis.      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 Negative for metastatic disease.      02/21/2014 Imaging    CT abd/pelvis- Stable abdominal pelvic CT status post right orchectomy. No evidence of adenopathy or other metastatic disease.      02/21/2014 Imaging    Chest xray- Left lower lobe mild atelectasis and/or infiltrate.       09/10/2014 Imaging    CT abd/pelvis- Status post right orchiectomy.  No evidence of metastatic disease.  3 mm nonobstructing right upper pole renal calculus. No hydronephrosis.      03/14/2015 Imaging    CT abd/pelvis- Stable exam. No evidence of metastatic disease or other acute findings within the abdomen or pelvis.      09/13/2015 Imaging    CT abd/pelvis- No acute findings and no evidence for mass or adenopathy.         ROS  Gen. status: Alert oriented not any acute distress HEENT: No headache.  No dizziness.  No epistaxis Lungs: Shortness of breath on exertion.  Patient continues to smoke Cardiac: No chest pain GI: No nausea no vomiting no diarrhea. gU: No hematuria.  Has not noticed any testicular mass Lower extremity no swelling Skin: No rash Neurological system: No tingling numbness    Past Medical History:  Diagnosis Date  . Arthritis   . BPH (benign prostatic hyperplasia)   . HOH (hard of hearing)   . Hypertension   . 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[place])    2014    Past Surgical History:  Procedure Laterality Date  . COLONOSCOPY N/A 11/24/2012   Procedure: COLONOSCOPY;  Surgeon: Rogene Houston, MD;  Location: [facility] SUITE;  Service: Endoscopy;  Laterality: N/A;  830-moved to [place] notified pt  . HEMORROIDECTOMY    . LESION EXCISION N/A 03/23/2012   Procedure: EXCISION NEOPLASM SCALP ;  Surgeon: Jamesetta So, MD;  Location: [facility] ORS;  Service: General;  Laterality: N/A;  Excision of Scalp Neoplasm  . ORCHIECTOMY Right 12/20/2012   Procedure: RIGHT RADICAL  ORCHIECTOMY/POSSIBLE BX RIGHT TESTICLE;  Surgeon: Marissa Nestle, MD;  Location: [facility] ORS;  Service: Urology;  Laterality: Right;  . SCALP LACERATION REPAIR     [facility]-Dr Tamala Julian    Family History  Problem Relation Age of Onset  . Cancer Brother   . Cancer Brother     Social History   Social History  . Marital status: Single    Spouse name: N/A  . Number of children: N/A  . Years of education: N/A   Social History Main Topics  . Smoking status: Current Every Day Smoker    Packs/day: 0.50    Years: 50.00    Types: Cigarettes  . Smokeless tobacco: Never Used  . Alcohol use 1.8 oz/week     3 Shots of liquor per week     Comment: 3 mixed  drinks per week  . Drug use: No  . Sexual activity: Yes    Birth control/ protection: None   Other Topics Concern  . Not on file   Social History Narrative  . No narrative on file     PHYSICAL EXAMINATION  ECOG PERFORMANCE STATUS: 1 - Symptomatic but completely ambulatory  Blood pressure (!) 128/51, pulse 67, temperature 97.8 F (36.6 C), temperature source Oral, resp. rate 16, height 5\' 11"  (1.803 m), weight 182 lb (82.6 kg), SpO2 98 %.  General  status: Performance status is good.  Patient has not lost significant weight. Since last evaluation there is no significant change in the general status HEENT: No evidence of stomatitis. Sclera and conjunctivae :: No jaundice.   pale looking. Lungs: Air  entry equal on both sides.  No rhonchi.  No rales.  Cardiac: Heart sounds are normal.  No pericardial rub.  No murmur. Lymphatic system: Cervical, axillary, inguinal, lymph nodes not palpable GI: Abdomen is soft.liver and spleen not palpable.  No ascites.  Bowel sounds are normal.  No other palpable masses.  No tenderness . Lower extremity: No edema Neurological system: Higher functions, cranial nerves intact no evidence of peripheral neuropathy. Skin: No rash.  No ecchymosis.. No petechial hemorrhages Testicular examination limited  Because  because of body habitus .  LABORATORY DATA: CBC    Component Value Date/Time   WBC 4.7 03/15/2015 1321   RBC 3.79 (L) 03/15/2015 1321   HGB 12.7 (L) 03/15/2015 1321   HCT 37.9 (L) 03/15/2015 1321   PLT 215 03/15/2015 1321   MCV 100.0 03/15/2015 1321   MCH 33.5 03/15/2015 1321   MCHC 33.5 03/15/2015 1321   RDW 13.6 03/15/2015 1321   LYMPHSABS 1.1 03/15/2015 1321   MONOABS 0.5 03/15/2015 1321   EOSABS 0.2 03/15/2015 1321   BASOSABS 0.0 03/15/2015 1321      Chemistry      Component Value Date/Time   NA 141 03/15/2015 1321   K 4.2 03/15/2015 1321   CL 109 03/15/2015 1321   CO2 27  03/15/2015 1321   BUN 13 03/15/2015 1321   CREATININE 0.80 09/13/2015 1027      Component Value Date/Time   CALCIUM 9.6 03/15/2015 1321   ALKPHOS 49 03/15/2015 1321   AST 23 03/15/2015 1321   ALT 15 (L) 03/15/2015 1321   BILITOT 0.6 03/15/2015 1321        PENDING LABS:  Alpha-fetoprotein and beta CD pending  RADIOGRAPHIC STUDIES: PACS Images   Show images for CT Abdomen Pelvis W Contrast  Study Result   CLINICAL DATA:  Malignant neoplasm of the right testis  EXAM: CT ABDOMEN AND  PELVIS WITH CONTRAST  TECHNIQUE: Multidetector CT imaging of the abdomen and pelvis was performed using the standard protocol following bolus administration of intravenous contrast.  CONTRAST:  153mL ISOVUE-300 IOPAMIDOL (ISOVUE-300) INJECTION 61%  COMPARISON:  3/2/ 17  FINDINGS: Lower chest: No pleural or pericardial effusion. Dependent changes noted within both lung bases.  Hepatobiliary: No masses or other significant abnormality.  Pancreas: No mass, inflammatory changes, or other significant abnormality.  Spleen: Within normal limits in size and appearance.  Adrenals/Urinary Tract: No masses identified. No evidence of hydronephrosis.  Stomach/Bowel: No evidence of obstruction, inflammatory process, or abnormal fluid collections.  Vascular/Lymphatic: Aortic atherosclerosis noted. No upper abdominal adenopathy. No pelvic or inguinal adenopathy.  Reproductive: No mass or other significant abnormality.  Other: There is no ascites or focal fluid collections within the abdomen or pelvis.  Musculoskeletal: Degenerative disc disease noted within the lumbar spine. This is most advanced at L5-S1.  IMPRESSION: 1. No acute findings and no evidence for mass or adenopathy. 2. Aortic atherosclerosis 3. Lumbar spondylosis.   Electronically Signed   By: Kerby Moors M.D.   On: 09/13/2015 12:23      PATHOLOGY:    ASSESSMENT AND PLAN:  Seminoma of the  right testis status post orchiectomy in 2014 Stage IB disease CT scan has been reviewed No evidence of recurrent disease based on clinical criteria Tumor markers are pending  CT scan of abdomen and pelvis and chest x-ray has been ordered for evaluation in 6 months along with tumor markers.Marland Kitchen He is also smoker     THERAPY PLAN:  NCCN guidelines surveillance for Stage I seminomatous testicular cancer after orchiectomy without adjuvant chemotherapy recommends the following surveillance (2.2017):  A. Year 1: H+P every 3-6 months, abdominal/pelvic CT at 3, 6, 12 months, Chest X-ray as clinically indicated (consider CT of chest with contrast in symptomatic patients).   2. Year 2: H+P every 6-12 months, abdominal/pelvic CT every 6-12 months, chest x-ray as clinically indicated (consider CT of chest with contrast in symptomatic patients).   3. Year 3:H+P every 6-12 months, abdominal/pelvic CT every 6-12 months, chest x-ray as clinically indicated (consider CT of chest with contrast in symptomatic patients).   4. Year 4: H+P every 12 months, abdominal/pelvis CT every 12-24 months, and chest x-ray as clinically indicated (consider CT of chest with contrast in symptomatic patients).  5. Year 5:  H+P every 12 months, abdominal/pelvis CT every 12-24 months, and chest x-ray as clinically indicated (consider CT of chest with contrast in symptomatic patients).   All questions were answered. The patient knows to call the clinic with any problems, questions or concerns. We can certainly see the patient much sooner if necessary.  Patient and plan discussed with Dr. Ancil Linsey and she is in agreement with the aforementioned.   This note is electronically signed by: Forest Gleason, MD 10/24/2015 1:49 PM

## 2016-04-14 ENCOUNTER — Encounter (HOSPITAL_COMMUNITY): Payer: Medicare Other | Attending: Oncology

## 2016-04-14 DIAGNOSIS — C6291 Malignant neoplasm of right testis, unspecified whether descended or undescended: Secondary | ICD-10-CM | POA: Diagnosis not present

## 2016-04-14 LAB — COMPREHENSIVE METABOLIC PANEL
ALT: 17 U/L (ref 17–63)
AST: 27 U/L (ref 15–41)
Albumin: 4.3 g/dL (ref 3.5–5.0)
Alkaline Phosphatase: 53 U/L (ref 38–126)
Anion gap: 7 (ref 5–15)
BUN: 14 mg/dL (ref 6–20)
CHLORIDE: 109 mmol/L (ref 101–111)
CO2: 25 mmol/L (ref 22–32)
CREATININE: 0.99 mg/dL (ref 0.61–1.24)
Calcium: 9.8 mg/dL (ref 8.9–10.3)
GFR calc Af Amer: >60 mL/min (ref >60)
GFR calc non Af Amer: >60 mL/min (ref >60)
Glucose, Bld: 104 mg/dL — ABNORMAL HIGH (ref 65–99)
POTASSIUM: 3.9 mmol/L (ref 3.5–5.1)
Sodium: 141 mmol/L (ref 135–145)
Total Bilirubin: 0.6 mg/dL (ref 0.3–1.2)
Total Protein: 7.5 g/dL (ref 6.5–8.1)

## 2016-04-14 LAB — CBC WITH DIFFERENTIAL/PLATELET
Basophils Absolute: 0.1 10*3/uL (ref 0.0–0.1)
Basophils Relative: 1 %
EOS PCT: 4 %
Eosinophils Absolute: 0.2 10*3/uL (ref 0.0–0.7)
HEMATOCRIT: 37.5 % — AB (ref 39.0–52.0)
Hemoglobin: 12.8 g/dL — ABNORMAL LOW (ref 13.0–17.0)
LYMPHS PCT: 33 %
Lymphs Abs: 1.9 10*3/uL (ref 0.7–4.0)
MCH: 34 pg (ref 26.0–34.0)
MCHC: 34.1 g/dL (ref 30.0–36.0)
MCV: 99.7 fL (ref 78.0–100.0)
MONO ABS: 0.6 10*3/uL (ref 0.1–1.0)
MONOS PCT: 10 %
NEUTROS ABS: 3.1 10*3/uL (ref 1.7–7.7)
Neutrophils Relative %: 52 %
PLATELETS: 234 10*3/uL (ref 150–400)
RBC: 3.76 MIL/uL — ABNORMAL LOW (ref 4.22–5.81)
RDW: 13.4 % (ref 11.5–15.5)
WBC: 5.9 10*3/uL (ref 4.0–10.5)

## 2016-04-15 LAB — BETA HCG QUANT (REF LAB): Beta hCG, Tumor Marker: <1 m[IU]/mL (ref 0–3)

## 2016-04-15 LAB — AFP TUMOR MARKER: AFP-Tumor Marker: 1.5 ng/mL (ref 0.0–8.3)

## 2016-04-24 ENCOUNTER — Ambulatory Visit (HOSPITAL_COMMUNITY)
Admission: RE | Admit: 2016-04-24 | Discharge: 2016-04-24 | Disposition: A | Discharge status: Home / Self Care | Payer: Medicare Other | Source: Ambulatory Visit | Attending: Oncology | Admitting: Oncology

## 2016-04-24 DIAGNOSIS — I700 Atherosclerosis of aorta: Secondary | ICD-10-CM | POA: Diagnosis not present

## 2016-04-24 DIAGNOSIS — C6291 Malignant neoplasm of right testis, unspecified whether descended or undescended: Secondary | ICD-10-CM | POA: Insufficient documentation

## 2016-04-24 MED ORDER — IOPAMIDOL (ISOVUE-300) INJECTION 61%
100.0000 mL | Freq: Once | INTRAVENOUS | Status: AC | PRN
  Administered 2016-04-24: 100 mL via INTRAVENOUS

## 2016-04-29 ENCOUNTER — Encounter (HOSPITAL_COMMUNITY): Payer: Self-pay | Admitting: Adult Health

## 2016-04-29 ENCOUNTER — Encounter (HOSPITAL_BASED_OUTPATIENT_CLINIC_OR_DEPARTMENT_OTHER): Payer: Medicare Other | Admitting: Adult Health

## 2016-04-29 VITALS — BP 129/61 | HR 69 | Temp 97.9°F | Resp 16 | Ht 71.0 in | Wt 184.5 lb

## 2016-04-29 DIAGNOSIS — C6291 Malignant neoplasm of right testis, unspecified whether descended or undescended: Secondary | ICD-10-CM | POA: Diagnosis not present

## 2016-04-29 NOTE — Progress Notes (Signed)
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 Testis, biopsy, right - SEMINOMA. PLEASE SEE COMMENT. 2. Testis, tumor, right - SEMINOMA, 7.5 CM. - ANGIOLYMPHATIC INVASION PRESENT. - RESECTION MARGINS, NEGATIVE FOR ATYPIA OR MALIGNANCY.      01/18/2013 PET scan    Status post right orchiectomy. No findings specific for metastatic disease. Small para-aortic nodes measuring up to 5 mm short axis, without convincing hypermetabolism. Given location, attention on follow-up is suggested.      04/17/2013 Imaging    CT CAP- No evidence of metastatic disease in the chest, abdomen or pelvis. Proximal LAD coronary artery calcification.      07/20/2013 Imaging    CT abd/pelvis- No evidence of metastatic disease in the abdomen or pelvis.      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 Negative for metastatic disease.      02/21/2014 Imaging    CT abd/pelvis- Stable abdominal pelvic CT status post right orchectomy. No evidence of adenopathy or other metastatic disease.      02/21/2014 Imaging    Chest xray- Left lower lobe mild atelectasis and/or infiltrate.       09/10/2014 Imaging    CT abd/pelvis- Status post right orchiectomy.  No evidence of metastatic disease.  3 mm nonobstructing right upper pole renal calculus. No hydronephrosis.      03/14/2015 Imaging    CT abd/pelvis- Stable exam. No evidence of metastatic disease or other acute findings within the abdomen or pelvis.      09/13/2015 Imaging    CT  abd/pelvis- No acute findings and no evidence for mass or adenopathy.       04/24/2016 Imaging    CT abd/pelvis: IMPRESSION: 1. Stable exam. No new or progressive findings. No features to suggest metastatic disease.        INTERVAL HISTORY:  Mr. Nathaniel Hicks 76 y.o. male here for continued follow-up for Stage IB right testicular cancer.   Overall, he tells me that he feels great. His appetite and energy levels are excellent, both are at 100% of his baseline.  He denies any cough/shortness of breath, fever/chills, chest pain, changes in his bowel/bladder, nausea, vomiting, dizziness, headaches, or sleep disturbance.   He has very seldom bilateral upper thigh cramp episodes that are excruciating; it only happens about once every 6 months. Otherwise, he is largely without complaints today.      REVIEW OF SYSTEMS:  Review of Systems  Constitutional: Negative.  Negative for chills, fatigue and fever.  HENT:  Negative.  Negative for lump/mass and nosebleeds.   Eyes: Negative.   Respiratory: Negative.  Negative for cough and shortness of breath.   Cardiovascular: Negative.  Negative for chest pain and leg swelling.  Gastrointestinal: Negative.  Negative for abdominal pain, blood in stool, constipation, diarrhea, nausea and vomiting.  Endocrine: Negative.   Genitourinary: Negative.  Negative for dysuria and hematuria.   Musculoskeletal: Positive for myalgias. Negative for arthralgias.  Skin: Negative.  Negative for rash.  Neurological:  Negative.  Negative for dizziness and headaches.  Hematological: Negative.  Negative for adenopathy. Does not bruise/bleed easily.  Psychiatric/Behavioral: Negative.  Negative for depression and sleep disturbance. The patient is not nervous/anxious.      PAST MEDICAL/SURGICAL HISTORY:  Past Medical History:  Diagnosis Date  . Arthritis   . BPH (benign prostatic hyperplasia)   . HOH (hard of hearing)   . Hypertension   . 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[place])    2014     Past Surgical History:  Procedure Laterality Date  . COLONOSCOPY N/A 11/24/2012   Procedure: COLONOSCOPY;  Surgeon: Rogene Houston, MD;  [facility];  Service: Endoscopy;  Laterality: N/A;  830-moved to [place] notified pt  . HEMORROIDECTOMY    . LESION EXCISION N/A 03/23/2012   Procedure: EXCISION NEOPLASM SCALP ;  Surgeon: Jamesetta So, MD;  Location: [facility];  Service: General;  Laterality: N/A;  Excision of Scalp Neoplasm  . ORCHIECTOMY Right 12/20/2012   Procedure: RIGHT RADICAL ORCHIECTOMY/POSSIBLE BX RIGHT TESTICLE;  Surgeon: Marissa Nestle, MD;  Location: [facility];  Service: Urology;  Laterality: Right;  . SCALP LACERATION REPAIR     [facility]-Dr Tamala Julian     SOCIAL HISTORY:  Social History   Social History  . Marital status: Single    Spouse name: N/A  . Number of children: N/A  . Years of education: N/A   Occupational History  . Not on file.   Social History Main Topics  . Smoking status: Current Every Day Smoker    Packs/day: 0.50    Years: 50.00    Types: Cigarettes  . Smokeless tobacco: Never Used  . Alcohol use 1.8 oz/week    3 Shots of liquor per week     Comment: 3 mixed  drinks per week  . Drug use: No  . Sexual activity: Yes    Birth control/ protection: None   Other Topics Concern  . Not on file   Social History Narrative  . No narrative on file    FAMILY HISTORY:  Family History  Problem Relation Age of Onset  . Cancer Brother   . Cancer Brother     CURRENT MEDICATIONS:  Outpatient Encounter Prescriptions as of 04/29/2016  Medication Sig Note  . amLODipine-benazepril (LOTREL) 10-20 MG per capsule 1 capsule daily.  01/19/2013: Received from: External Pharmacy  . DiphenhydrAMINE HCl (BENADRYL PO) Take 1 capsule by mouth as needed. 10/25/2013: Received from: External Pharmacy Received Sig:   . Multiple Vitamins-Minerals (MULTIVITAMIN WITH MINERALS) tablet Take 1 tablet by mouth daily.   . naproxen (NAPROSYN) 500 MG tablet Take 500 mg by  mouth. Takes once a day 01/19/2013: Received from: External Pharmacy  . RAPAFLO 8 MG CAPS capsule Take 1 capsule by mouth daily. 10/25/2013: Received from: External Pharmacy Received Sig:   . traMADol (ULTRAM) 50 MG tablet 50 mg. Takes one a day 01/19/2013: Received from: External Pharmacy   No facility-administered encounter medications on file as of 04/29/2016.     ALLERGIES:  No Known Allergies   PHYSICAL EXAM:  ECOG Performance status: 0 - Asymptomatic   Vitals:   04/29/16 1320  BP: 129/61  Pulse: 69  Resp: 16  Temp: 97.9 F (36.6 C)   Filed Weights   04/29/16 1320  Weight: 184 lb 8 oz (83.7 kg)    Physical Exam  Constitutional: He is oriented to person, place, and time and well-developed, well-nourished, and in no distress.  HENT:  Head: Normocephalic.  Mouth/Throat: Oropharynx  is clear and moist. No oropharyngeal exudate.  Right post-auricular/base of skull area with mobile fluid collection measuring about 5 x 5 cm. Pt states area has had to be drained several times in past; is not currently bothering him and has been a chronic issue. It is largely unchanged. Appears to be benign cyst on exam.   Eyes: Conjunctivae are normal. Pupils are equal, round, and reactive to light. No scleral icterus.  Neck: Normal range of motion. Neck supple.  Cardiovascular: Normal rate, regular rhythm and normal heart sounds.   Pulmonary/Chest: Effort normal and breath sounds normal. No respiratory distress.  Abdominal: Soft. Bowel sounds are normal. There is no tenderness.  Genitourinary:  Genitourinary Comments: Testicular/Scrotal exam performed by Kirby Crigler, PA-C (at the request of patient): Per Tom's exam, absent right testis. No palpable masses to left testis; no nodularity appreciated to scrotum or groin.   Musculoskeletal: Normal range of motion. He exhibits no edema.  Lymphadenopathy:    He has no cervical adenopathy.       Right: No supraclavicular adenopathy present.       Left: No  supraclavicular adenopathy present.  Neurological: He is alert and oriented to person, place, and time. No cranial nerve deficit. Gait normal.  Skin: Skin is warm and dry. No rash noted.  Psychiatric: Mood, memory, affect and judgment normal.  Nursing note and vitals reviewed.    LABORATORY DATA:  I have reviewed the labs as listed.  CBC    Component Value Date/Time   WBC 5.9 04/14/2016 1155   RBC 3.76 (L) 04/14/2016 1155   HGB 12.8 (L) 04/14/2016 1155   HCT 37.5 (L) 04/14/2016 1155   PLT 234 04/14/2016 1155   MCV 99.7 04/14/2016 1155   MCH 34.0 04/14/2016 1155   MCHC 34.1 04/14/2016 1155   RDW 13.4 04/14/2016 1155   LYMPHSABS 1.9 04/14/2016 1155   MONOABS 0.6 04/14/2016 1155   EOSABS 0.2 04/14/2016 1155   BASOSABS 0.1 04/14/2016 1155   CMP Latest Ref Rng & Units 04/14/2016 09/13/2015 03/15/2015  Glucose 65 - 99 mg/dL 104(H) - 91  BUN 6 - 20 mg/dL 14 - 13  Creatinine 0.61 - 1.24 mg/dL 0.99 0.80 0.85  Sodium 135 - 145 mmol/L 141 - 141  Potassium 3.5 - 5.1 mmol/L 3.9 - 4.2  Chloride 101 - 111 mmol/L 109 - 109  CO2 22 - 32 mmol/L 25 - 27  Calcium 8.9 - 10.3 mg/dL 9.8 - 9.6  Total Protein 6.5 - 8.1 g/dL 7.5 - 7.2  Total Bilirubin 0.3 - 1.2 mg/dL 0.6 - 0.6  Alkaline Phos 38 - 126 U/L 53 - 49  AST 15 - 41 U/L 27 - 23  ALT 17 - 63 U/L 17 - 15(L)       PENDING LABS:    DIAGNOSTIC IMAGING:  CT abd/pelvis: 04/24/16     PATHOLOGY:  (R) orchiectomy surgical path: 12/20/12       ASSESSMENT & PLAN:   Stage IB right seminoma testicular cancer:  -Diagnosed in 12/2012; treated with right total orchiectomy. Elected for close surveillance versus adjuvant systemic therapy.  -Last CT abd/pelvis on 04/24/16 negative for recurrence. We reviewed results together today in detail and he was provided a copy of the radiology report.   -Beta hCG and AFP tumor markers have been stable.  -He is now nearly 3.5 years out from his original diagnosis with no signs of recurrence; this is very  favorable.   -Return to cancer center in 6 months  with repeat CT abd/pelvis and tumor markers. If all remains stable at that visit, then can extend scans and follow-up visits to every 1 year. He is agreeable to this plan.   NCCN Guidelines reviewed.     Wellness promotion:  -Encouraged continued follow-up visits with PCP for physicals, vaccinations, and appropriate cancer screenings.     Dispo:  -Return to cancer center in 6 months with CT abd/pelvis and tumor markers. I will make arrangements for him to see Kirby Crigler, PA-C at subsequent visits as patient feels more comfortable having a male provider perform his testicular exams, which is certainly reasonable.       All questions were answered to patient's stated satisfaction. Encouraged patient to call with any new concerns or questions before his next visit to the cancer center and we can certain see him sooner, if needed.    Plan of care discussed with Dr. Talbert Cage, who agrees with the above aforementioned.     Orders placed this encounter:  Orders Placed This Encounter  Procedures  . CT ABDOMEN PELVIS Mason, NP [place] [phone]

## 2016-04-29 NOTE — Patient Instructions (Addendum)
[place] at [facility] Discharge Instructions  RECOMMENDATIONS MADE BY THE CONSULTANT AND ANY TEST RESULTS WILL BE SENT TO YOUR REFERRING PHYSICIAN.  You were seen today by Mike Craze NP We will schedule your CT scan Follow up in 6 months with labs See scheduler up front for appointments    Thank you for choosing [place] at [facility] to provide your oncology and hematology care.  To afford each patient quality time with our provider, please arrive at least 15 minutes before your scheduled appointment time.    If you have a lab appointment with the [facility] please come in thru the  Main Entrance and check in at the main information desk  You need to re-schedule your appointment should you arrive 10 or more minutes late.  We strive to give you quality time with our providers, and arriving late affects you and other patients whose appointments are after yours.  Also, if you no show three or more times for appointments you may be dismissed from the clinic at the providers discretion.     Again, thank you for choosing [facility].  Our hope is that these requests will decrease the amount of time that you wait before being seen by our physicians.       _____________________________________________________________  Should you have questions after your visit to [facility], please contact our office at [phone] between the hours of 8:30 a.m. and 4:30 p.m.  Voicemails left after 4:30 p.m. will not be returned until the following business day.  For prescription refill requests, have your pharmacy contact our office.       Resources For Cancer Patients and their Caregivers ? American Cancer Society: Can assist with transportation, wigs, general needs, runs Look Good Feel Better.        415-623-5229 ? Cancer Care: Provides financial assistance, online support groups, medication/co-pay assistance.   1-800-813-HOPE 6236522029) ? [facility] Assists [place] cancer patients and their families through emotional , educational and financial support.  323-081-1855 ? [place] DSS Where to apply for food stamps, Medicaid and utility assistance. (234)538-8555 ? [employer]: Transportation to medical appointments. 7475465571 ? Social Security Administration: May apply for disability if have a Stage IV cancer. 737-679-4852 (251)463-8149 ? [employer], Disability and Transit Services: Assists with nutrition, care and transit needs. [place] Support Programs: @10RELATIVEDAYS @ > Cancer Support Group  2nd Tuesday of the month 1pm-2pm, Journey Room  > Creative Journey  3rd Tuesday of the month 1130am-1pm, Journey Room  > Look Good Feel Better  1st Wednesday of the month 10am-12 noon, Journey Room (Call [place] to register [phone])

## 2016-10-15 ENCOUNTER — Other Ambulatory Visit (HOSPITAL_COMMUNITY): Payer: Self-pay | Admitting: *Deleted

## 2016-10-15 DIAGNOSIS — C6291 Malignant neoplasm of right testis, unspecified whether descended or undescended: Secondary | ICD-10-CM

## 2016-10-16 ENCOUNTER — Encounter (HOSPITAL_COMMUNITY): Payer: Medicare Other | Attending: Oncology

## 2016-10-16 DIAGNOSIS — C6211 Malignant neoplasm of descended right testis: Secondary | ICD-10-CM | POA: Insufficient documentation

## 2016-10-16 DIAGNOSIS — C6291 Malignant neoplasm of right testis, unspecified whether descended or undescended: Secondary | ICD-10-CM

## 2016-10-16 LAB — CBC WITH DIFFERENTIAL/PLATELET
Basophils Absolute: 0.1 10*3/uL (ref 0.0–0.1)
Basophils Relative: 1 %
Eosinophils Absolute: 0.3 10*3/uL (ref 0.0–0.7)
Eosinophils Relative: 6 %
HCT: 37.5 % — ABNORMAL LOW (ref 39.0–52.0)
HEMOGLOBIN: 12.5 g/dL — AB (ref 13.0–17.0)
LYMPHS ABS: 1.7 10*3/uL (ref 0.7–4.0)
LYMPHS PCT: 35 %
MCH: 33.2 pg (ref 26.0–34.0)
MCHC: 33.3 g/dL (ref 30.0–36.0)
MCV: 99.7 fL (ref 78.0–100.0)
Monocytes Absolute: 0.5 10*3/uL (ref 0.1–1.0)
Monocytes Relative: 10 %
NEUTROS PCT: 48 %
Neutro Abs: 2.3 10*3/uL (ref 1.7–7.7)
Platelets: 205 10*3/uL (ref 150–400)
RBC: 3.76 MIL/uL — AB (ref 4.22–5.81)
RDW: 13.7 % (ref 11.5–15.5)
WBC: 4.8 10*3/uL (ref 4.0–10.5)

## 2016-10-16 LAB — COMPREHENSIVE METABOLIC PANEL
ALK PHOS: 53 U/L (ref 38–126)
ALT: 23 U/L (ref 17–63)
AST: 31 U/L (ref 15–41)
Albumin: 4.3 g/dL (ref 3.5–5.0)
Anion gap: 8 (ref 5–15)
BUN: 13 mg/dL (ref 6–20)
CO2: 24 mmol/L (ref 22–32)
CREATININE: 0.8 mg/dL (ref 0.61–1.24)
Calcium: 9.6 mg/dL (ref 8.9–10.3)
Chloride: 107 mmol/L (ref 101–111)
Glucose, Bld: 110 mg/dL — ABNORMAL HIGH (ref 65–99)
Potassium: 3.6 mmol/L (ref 3.5–5.1)
Sodium: 139 mmol/L (ref 135–145)
Total Bilirubin: 0.7 mg/dL (ref 0.3–1.2)
Total Protein: 7.4 g/dL (ref 6.5–8.1)

## 2016-10-17 LAB — AFP TUMOR MARKER: AFP, SERUM, TUMOR MARKER: 2.4 ng/mL (ref 0.0–8.3)

## 2016-10-17 LAB — BETA HCG QUANT (REF LAB)

## 2016-10-23 ENCOUNTER — Ambulatory Visit (HOSPITAL_COMMUNITY)
Admission: RE | Admit: 2016-10-23 | Discharge: 2016-10-23 | Disposition: A | Discharge status: Home / Self Care | Payer: Medicare Other | Source: Ambulatory Visit | Attending: Adult Health | Admitting: Adult Health

## 2016-10-23 DIAGNOSIS — C6291 Malignant neoplasm of right testis, unspecified whether descended or undescended: Secondary | ICD-10-CM | POA: Diagnosis present

## 2016-10-23 DIAGNOSIS — N281 Cyst of kidney, acquired: Secondary | ICD-10-CM | POA: Diagnosis not present

## 2016-10-23 DIAGNOSIS — I700 Atherosclerosis of aorta: Secondary | ICD-10-CM | POA: Diagnosis not present

## 2016-10-23 MED ORDER — IOPAMIDOL (ISOVUE-300) INJECTION 61%
100.0000 mL | Freq: Once | INTRAVENOUS | Status: AC | PRN
  Administered 2016-10-23: 100 mL via INTRAVENOUS

## 2016-10-28 ENCOUNTER — Encounter (HOSPITAL_BASED_OUTPATIENT_CLINIC_OR_DEPARTMENT_OTHER): Payer: Medicare Other | Admitting: Oncology

## 2016-10-28 VITALS — BP 134/55 | HR 65 | Temp 98.2°F | Resp 17 | Wt 184.4 lb

## 2016-10-28 DIAGNOSIS — C6291 Malignant neoplasm of right testis, unspecified whether descended or undescended: Secondary | ICD-10-CM

## 2016-10-28 DIAGNOSIS — C6211 Malignant neoplasm of descended right testis: Secondary | ICD-10-CM

## 2016-10-28 NOTE — Progress Notes (Signed)
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 Testis, biopsy, right - SEMINOMA. PLEASE SEE COMMENT. 2. Testis, tumor, right - SEMINOMA, 7.5 CM. - ANGIOLYMPHATIC INVASION PRESENT. - RESECTION MARGINS, NEGATIVE FOR ATYPIA OR MALIGNANCY.      01/18/2013 PET scan    Status post right orchiectomy. No findings specific for metastatic disease. Small para-aortic nodes measuring up to 5 mm short axis, without convincing hypermetabolism. Given location, attention on follow-up is suggested.      04/17/2013 Imaging    CT CAP- No evidence of metastatic disease in the chest, abdomen or pelvis. Proximal LAD coronary artery calcification.      07/20/2013 Imaging    CT abd/pelvis- No evidence of metastatic disease in the abdomen or pelvis.      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 Negative for metastatic disease.      02/21/2014 Imaging    CT abd/pelvis- Stable abdominal pelvic CT status post right orchectomy. No evidence of adenopathy or other metastatic disease.      02/21/2014 Imaging    Chest xray- Left lower lobe mild atelectasis and/or infiltrate.       09/10/2014 Imaging    CT abd/pelvis- Status post right orchiectomy.  No evidence of metastatic disease.  3 mm nonobstructing right upper pole renal calculus. No hydronephrosis.      03/14/2015 Imaging    CT abd/pelvis- Stable exam. No evidence of metastatic disease or other acute findings within the abdomen or pelvis.      09/13/2015 Imaging    CT  abd/pelvis- No acute findings and no evidence for mass or adenopathy.       04/24/2016 Imaging    CT abd/pelvis: IMPRESSION: 1. Stable exam. No new or progressive findings. No features to suggest metastatic disease.        INTERVAL HISTORY:  Mr. Thrall 76 y.o. male here for continued follow-up for Stage IB right testicular cancer.   Patient presents today for continued follow-up of his testicular cancer. He states he has been doing well and has no complaints today.  REVIEW OF SYSTEMS:  Review of Systems  Constitutional: Negative.  Negative for chills, fatigue and fever.  HENT:  Negative.  Negative for lump/mass and nosebleeds.   Eyes: Negative.   Respiratory: Negative.  Negative for cough and shortness of breath.   Cardiovascular: Negative.  Negative for chest pain and leg swelling.  Gastrointestinal: Negative.  Negative for abdominal pain, blood in stool, constipation, diarrhea, nausea and vomiting.  Endocrine: Negative.   Genitourinary: Negative.  Negative for dysuria and hematuria.   Musculoskeletal: Negative for arthralgias and myalgias.  Skin: Negative.  Negative for rash.  Neurological: Negative.  Negative for dizziness and headaches.  Hematological: Negative.  Negative for adenopathy. Does not bruise/bleed easily.  Psychiatric/Behavioral: Negative.  Negative for depression and sleep disturbance. The patient is not nervous/anxious.      PAST MEDICAL/SURGICAL HISTORY:  Past Medical History:  Diagnosis Date  . Arthritis   . BPH (  benign prostatic hyperplasia)   . HOH (hard of hearing)   . Hypertension   . 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[place])    2014   Past Surgical History:  Procedure Laterality Date  . COLONOSCOPY N/A 11/24/2012   Procedure: COLONOSCOPY;  Surgeon: Rogene Houston, MD;  [facility];  Service: Endoscopy;  Laterality: N/A;  830-moved to [place] notified pt  . HEMORROIDECTOMY    . LESION EXCISION N/A 03/23/2012   Procedure: EXCISION NEOPLASM SCALP ;   Surgeon: Jamesetta So, MD;  Location: [facility];  Service: General;  Laterality: N/A;  Excision of Scalp Neoplasm  . ORCHIECTOMY Right 12/20/2012   Procedure: RIGHT RADICAL ORCHIECTOMY/POSSIBLE BX RIGHT TESTICLE;  Surgeon: Marissa Nestle, MD;  Location: [facility];  Service: Urology;  Laterality: Right;  . SCALP LACERATION REPAIR     [facility]-Dr Tamala Julian     SOCIAL HISTORY:  Social History   Social History  . Marital status: Single    Spouse name: N/A  . Number of children: N/A  . Years of education: N/A   Occupational History  . Not on file.   Social History Main Topics  . Smoking status: Current Every Day Smoker    Packs/day: 0.50    Years: 50.00    Types: Cigarettes  . Smokeless tobacco: Never Used  . Alcohol use 1.8 oz/week    3 Shots of liquor per week     Comment: 3 mixed  drinks per week  . Drug use: No  . Sexual activity: Yes    Birth control/ protection: None   Other Topics Concern  . Not on file   Social History Narrative  . No narrative on file    FAMILY HISTORY:  Family History  Problem Relation Age of Onset  . Cancer Brother   . Cancer Brother     CURRENT MEDICATIONS:  Outpatient Encounter Prescriptions as of 10/28/2016  Medication Sig Note  . amLODipine-benazepril (LOTREL) 10-20 MG per capsule 1 capsule daily.  01/19/2013: Received from: External Pharmacy  . DiphenhydrAMINE HCl (BENADRYL PO) Take 1 capsule by mouth as needed. 10/25/2013: Received from: External Pharmacy Received Sig:   . Multiple Vitamins-Minerals (MULTIVITAMIN WITH MINERALS) tablet Take 1 tablet by mouth daily.   . naproxen (NAPROSYN) 500 MG tablet Take 500 mg by mouth. Takes once a day 01/19/2013: Received from: External Pharmacy  . RAPAFLO 8 MG CAPS capsule Take 1 capsule by mouth daily. 10/25/2013: Received from: External Pharmacy Received Sig:   . traMADol (ULTRAM) 50 MG tablet 50 mg. Takes one a day 01/19/2013: Received from: External Pharmacy   No facility-administered encounter  medications on file as of 10/28/2016.     ALLERGIES:  No Known Allergies   PHYSICAL EXAM:  ECOG Performance status: 0 - Asymptomatic   Vitals:   10/28/16 1427  BP: (!) 134/55  Pulse: 65  Resp: 17  Temp: 98.2 F (36.8 C)  SpO2: 97%   Filed Weights   10/28/16 1427  Weight: 184 lb 6.4 oz (83.6 kg)    Physical Exam  Constitutional: He is oriented to person, place, and time and well-developed, well-nourished, and in no distress.  HENT:  Head: Normocephalic.  Mouth/Throat: Oropharynx is clear and moist. No oropharyngeal exudate.  Right post-auricular/base of skull area with mobile fluid collection measuring about 5 x 5 cm. Pt states area has had to be drained several times in past; is not currently bothering him and has been a chronic issue. It is largely unchanged. Appears to  be benign cyst on exam.   Eyes: Pupils are equal, round, and reactive to light. Conjunctivae are normal. No scleral icterus.  Neck: Normal range of motion. Neck supple.  Cardiovascular: Normal rate, regular rhythm and normal heart sounds.   Pulmonary/Chest: Effort normal and breath sounds normal. No respiratory distress.  Abdominal: Soft. Bowel sounds are normal. There is no tenderness.  Musculoskeletal: Normal range of motion. He exhibits no edema.  Lymphadenopathy:    He has no cervical adenopathy.       Right: No supraclavicular adenopathy present.       Left: No supraclavicular adenopathy present.  Neurological: He is alert and oriented to person, place, and time. No cranial nerve deficit. Gait normal.  Skin: Skin is warm and dry. No rash noted.  Psychiatric: Mood, memory, affect and judgment normal.  Nursing note and vitals reviewed.    LABORATORY DATA:  I have reviewed the labs as listed.  CBC    Component Value Date/Time   WBC 4.8 10/16/2016 1000   RBC 3.76 (L) 10/16/2016 1000   HGB 12.5 (L) 10/16/2016 1000   HCT 37.5 (L) 10/16/2016 1000   PLT 205 10/16/2016 1000   MCV 99.7 10/16/2016  1000   MCH 33.2 10/16/2016 1000   MCHC 33.3 10/16/2016 1000   RDW 13.7 10/16/2016 1000   LYMPHSABS 1.7 10/16/2016 1000   MONOABS 0.5 10/16/2016 1000   EOSABS 0.3 10/16/2016 1000   BASOSABS 0.1 10/16/2016 1000   CMP Latest Ref Rng & Units 10/16/2016 04/14/2016 09/13/2015  Glucose 65 - 99 mg/dL 110(H) 104(H) -  BUN 6 - 20 mg/dL 13 14 -  Creatinine 0.61 - 1.24 mg/dL 0.80 0.99 0.80  Sodium 135 - 145 mmol/L 139 141 -  Potassium 3.5 - 5.1 mmol/L 3.6 3.9 -  Chloride 101 - 111 mmol/L 107 109 -  CO2 22 - 32 mmol/L 24 25 -  Calcium 8.9 - 10.3 mg/dL 9.6 9.8 -  Total Protein 6.5 - 8.1 g/dL 7.4 7.5 -  Total Bilirubin 0.3 - 1.2 mg/dL 0.7 0.6 -  Alkaline Phos 38 - 126 U/L 53 53 -  AST 15 - 41 U/L 31 27 -  ALT 17 - 63 U/L 23 17 -       PENDING LABS:    DIAGNOSTIC IMAGING:  CT abd/pelvis: 04/24/16     PATHOLOGY:  (R) orchiectomy surgical path: 12/20/12       ASSESSMENT & PLAN:   Stage IB right seminoma testicular cancer:  -Diagnosed in 12/2012; treated with right total orchiectomy. Elected for close surveillance versus adjuvant systemic therapy.  -LRestaging CT abdomen pelvis with contrast on 10/23/2016 demonstrated stable exam with no evidence for metastatic disease in the abdomen or pelvis. Lab work on 10/16/2016 demonstrated AFP 2.4, hCG less than 1. -He is now 4 years out from his original diagnosis with no signs of recurrence. -Patient did not want testicular exam to be performed today.   NCCN Guidelines reviewed.      Dispo:  -Return to cancer center in 12 months with labs. All questions were answered to patient's stated satisfaction. Encouraged patient to call with any new concerns or questions before his next visit to the cancer center and we can certain see him sooner, if needed.       Orders placed this encounter:  Orders Placed This Encounter  Procedures  . CBC with Differential  . Comprehensive metabolic panel  . HCG, tumor marker  . AFP tumor marker     Barbaraann Share  Talbert Cage, MD

## 2017-03-17 ENCOUNTER — Other Ambulatory Visit (HOSPITAL_COMMUNITY): Payer: Self-pay | Admitting: Family Medicine

## 2017-03-17 ENCOUNTER — Ambulatory Visit (HOSPITAL_COMMUNITY)
Admission: RE | Admit: 2017-03-17 | Discharge: 2017-03-17 | Disposition: A | Discharge status: Home / Self Care | Payer: Medicare Other | Source: Ambulatory Visit | Attending: Family Medicine | Admitting: Family Medicine

## 2017-03-17 DIAGNOSIS — M175 Other unilateral secondary osteoarthritis of knee: Secondary | ICD-10-CM

## 2017-03-17 DIAGNOSIS — M1712 Unilateral primary osteoarthritis, left knee: Secondary | ICD-10-CM | POA: Diagnosis present

## 2017-06-22 ENCOUNTER — Ambulatory Visit: Payer: Self-pay | Admitting: Orthopaedic Surgery

## 2017-06-23 ENCOUNTER — Ambulatory Visit (INDEPENDENT_AMBULATORY_CARE_PROVIDER_SITE_OTHER): Payer: Medicare Other | Admitting: Orthopaedic Surgery

## 2017-06-23 ENCOUNTER — Encounter: Payer: Self-pay | Admitting: Orthopaedic Surgery

## 2017-06-23 VITALS — BP 146/72 | HR 76 | Ht 71.0 in | Wt 191.0 lb

## 2017-06-23 DIAGNOSIS — G8929 Other chronic pain: Secondary | ICD-10-CM

## 2017-06-23 DIAGNOSIS — M25561 Pain in right knee: Secondary | ICD-10-CM

## 2017-06-23 NOTE — Patient Instructions (Addendum)
Steps to Quit Smoking Smoking tobacco can be bad for your health. It can also affect almost every organ in your body. Smoking puts you and people around you at risk for many serious Shaquia Berkley-lasting (chronic) diseases. Quitting smoking is hard, but it is one of the best things that you can do for your health. It is never too late to quit. What are the benefits of quitting smoking? When you quit smoking, you lower your risk for getting serious diseases and conditions. They can include:  Lung cancer or lung disease.  Heart disease.  Stroke.  Heart attack.  Not being able to have children (infertility).  Weak bones (osteoporosis) and broken bones (fractures).  If you have coughing, wheezing, and shortness of breath, those symptoms may get better when you quit. You may also get sick less often. If you are pregnant, quitting smoking can help to lower your chances of having a baby of low birth weight. What can I do to help me quit smoking? Talk with your doctor about what can help you quit smoking. Some things you can do (strategies) include:  Quitting smoking totally, instead of slowly cutting back how much you smoke over a period of time.  Going to in-person counseling. You are more likely to quit if you go to many counseling sessions.  Using resources and support systems, such as: ? Database administrator with a Social worker. ? Phone quitlines. ? Careers information officer. ? Support groups or group counseling. ? Text messaging programs. ? Mobile phone apps or applications.  Taking medicines. Some of these medicines may have nicotine in them. If you are pregnant or breastfeeding, do not take any medicines to quit smoking unless your doctor says it is okay. Talk with your doctor about counseling or other things that can help you.  Talk with your doctor about using more than one strategy at the same time, such as taking medicines while you are also going to in-person counseling. This can help make  quitting easier. What things can I do to make it easier to quit? Quitting smoking might feel very hard at first, but there is a lot that you can do to make it easier. Take these steps:  Talk to your family and friends. Ask them to support and encourage you.  Call phone quitlines, reach out to support groups, or work with a Social worker.  Ask people who smoke to not smoke around you.  Avoid places that make you want (trigger) to smoke, such as: ? Bars. ? Parties. ? Smoke-break areas at work.  Spend time with people who do not smoke.  Lower the stress in your life. Stress can make you want to smoke. Try these things to help your stress: ? Getting regular exercise. ? Deep-breathing exercises. ? Yoga. ? Meditating. ? Doing a body scan. To do this, close your eyes, focus on one area of your body at a time from head to toe, and notice which parts of your body are tense. Try to relax the muscles in those areas.  Download or buy apps on your mobile phone or tablet that can help you stick to your quit plan. There are many free apps, such as QuitGuide from the [employer] Office manager for Disease Control and Prevention). You can find more support from smokefree.gov and other websites.  This information is not intended to replace advice given to you by your health care provider. Make sure you discuss any questions you have with your health care provider. Knee Exercises Ask your  health care provider which exercises are safe for you. Do exercises exactly as told by your health care provider and adjust them as directed. It is normal to feel mild stretching, pulling, tightness, or discomfort as you do these exercises, but you should stop right away if you feel sudden pain or your pain gets worse.Do not begin these exercises until told by your health care provider. STRETCHING AND RANGE OF MOTION EXERCISES These exercises warm up your muscles and joints and improve the movement and flexibility of your knee. These  exercises also help to relieve pain, numbness, and tingling. Exercise A: Knee Extension, Prone 1. Lie on your abdomen on a bed. 2. Place your left / right knee just beyond the edge of the surface so your knee is not on the bed. You can put a towel under your left / right thigh just above your knee for comfort. 3. Relax your leg muscles and allow gravity to straighten your knee. You should feel a stretch behind your left / right knee. 4. Hold this position for __________ seconds. 5. Scoot up so your knee is supported between repetitions. Repeat __________ times. Complete this stretch __________ times a day. Exercise B: Knee Flexion, Active  1. Lie on your back with both knees straight. If this causes back discomfort, bend your left / right knee so your foot is flat on the floor. 2. Slowly slide your left / right heel back toward your buttocks until you feel a gentle stretch in the front of your knee or thigh. 3. Hold this position for __________ seconds. 4. Slowly slide your left / right heel back to the starting position. Repeat __________ times. Complete this exercise __________ times a day. Exercise C: Quadriceps, Prone  1. Lie on your abdomen on a firm surface, such as a bed or padded floor. 2. Bend your left / right knee and hold your ankle. If you cannot reach your ankle or pant leg, loop a belt around your foot and grab the belt instead. 3. Gently pull your heel toward your buttocks. Your knee should not slide out to the side. You should feel a stretch in the front of your thigh and knee. 4. Hold this position for __________ seconds. Repeat __________ times. Complete this stretch __________ times a day. Exercise D: Hamstring, Supine 1. Lie on your back. 2. Loop a belt or towel over the ball of your left / right foot. The ball of your foot is on the walking surface, right under your toes. 3. Straighten your left / right knee and slowly pull on the belt to raise your leg until you feel a  gentle stretch behind your knee. ? Do not let your left / right knee bend while you do this. ? Keep your other leg flat on the floor. 4. Hold this position for __________ seconds. Repeat __________ times. Complete this stretch __________ times a day. STRENGTHENING EXERCISES These exercises build strength and endurance in your knee. Endurance is the ability to use your muscles for a Nalaya Wojdyla time, even after they get tired. Exercise E: Quadriceps, Isometric  1. Lie on your back with your left / right leg extended and your other knee bent. Put a rolled towel or small pillow under your knee if told by your health care provider. 2. Slowly tense the muscles in the front of your left / right thigh. You should see your kneecap slide up toward your hip or see increased dimpling just above the knee. This motion will push  the back of the knee toward the floor. 3. For __________ seconds, keep the muscle as tight as you can without increasing your pain. 4. Relax the muscles slowly and completely. Repeat __________ times. Complete this exercise __________ times a day. Exercise F: Straight Leg Raises - Quadriceps 1. Lie on your back with your left / right leg extended and your other knee bent. 2. Tense the muscles in the front of your left / right thigh. You should see your kneecap slide up or see increased dimpling just above the knee. Your thigh may even shake a bit. 3. Keep these muscles tight as you raise your leg 4-6 inches (10-15 cm) off the floor. Do not let your knee bend. 4. Hold this position for __________ seconds. 5. Keep these muscles tense as you lower your leg. 6. Relax your muscles slowly and completely after each repetition. Repeat __________ times. Complete this exercise __________ times a day. Exercise G: Hamstring, Isometric 1. Lie on your back on a firm surface. 2. Bend your left / right knee approximately __________ degrees. 3. Dig your left / right heel into the surface as if you are  trying to pull it toward your buttocks. Tighten the muscles in the back of your thighs to dig as hard as you can without increasing any pain. 4. Hold this position for __________ seconds. 5. Release the tension gradually and allow your muscles to relax completely for __________ seconds after each repetition. Repeat __________ times. Complete this exercise __________ times a day. Exercise H: Hamstring Curls  If told by your health care provider, do this exercise while wearing ankle weights. Begin with __________ weights. Then increase the weight by 1 lb (0.5 kg) increments. Do not wear ankle weights that are more than __________. 1. Lie on your abdomen with your legs straight. 2. Bend your left / right knee as far as you can without feeling pain. Keep your hips flat against the floor. 3. Hold this position for __________ seconds. 4. Slowly lower your leg to the starting position.  Repeat __________ times. Complete this exercise __________ times a day. Exercise I: Squats (Quadriceps) 1. Stand in front of a table, with your feet and knees pointing straight ahead. You may rest your hands on the table for balance but not for support. 2. Slowly bend your knees and lower your hips like you are going to sit in a chair. ? Keep your weight over your heels, not over your toes. ? Keep your lower legs upright so they are parallel with the table legs. ? Do not let your hips go lower than your knees. ? Do not bend lower than told by your health care provider. ? If your knee pain increases, do not bend as low. 3. Hold the squat position for __________ seconds. 4. Slowly push with your legs to return to standing. Do not use your hands to pull yourself to standing. Repeat __________ times. Complete this exercise __________ times a day. Exercise J: Wall Slides (Quadriceps)  1. Lean your back against a smooth wall or door while you walk your feet out 18-24 inches (46-61 cm) from it. 2. Place your feet  hip-width apart. 3. Slowly slide down the wall or door until your knees bend __________ degrees. Keep your knees over your heels, not over your toes. Keep your knees in line with your hips. 4. Hold for __________ seconds. Repeat __________ times. Complete this exercise __________ times a day. Exercise K: Straight Leg Raises - Hip Abductors 1.  Lie on your side with your left / right leg in the top position. Lie so your head, shoulder, knee, and hip line up. You may bend your bottom knee to help you keep your balance. 2. Roll your hips slightly forward so your hips are stacked directly over each other and your left / right knee is facing forward. 3. Leading with your heel, lift your top leg 4-6 inches (10-15 cm). You should feel the muscles in your outer hip lifting. ? Do not let your foot drift forward. ? Do not let your knee roll toward the ceiling. 4. Hold this position for __________ seconds. 5. Slowly return your leg to the starting position. 6. Let your muscles relax completely after each repetition. Repeat __________ times. Complete this exercise __________ times a day. Exercise L: Straight Leg Raises - Hip Extensors 1. Lie on your abdomen on a firm surface. You can put a pillow under your hips if that is more comfortable. 2. Tense the muscles in your buttocks and lift your left / right leg about 4-6 inches (10-15 cm). Keep your knee straight as you lift your leg. 3. Hold this position for __________ seconds. 4. Slowly lower your leg to the starting position. 5. Let your leg relax completely after each repetition. Repeat __________ times. Complete this exercise __________ times a day. This information is not intended to replace advice given to you by your health care provider. Make sure you discuss any questions you have with your health care provider. Document Released: 11/12/2004 Document Revised: 09/23/2015 Document Reviewed: 11/04/2014 Elsevier Interactive Patient Education  2018  [employer].

## 2017-06-23 NOTE — Progress Notes (Signed)
Subjective:    Patient ID: Nathaniel Hicks, male    DOB: 11/25/1940, 77 y.o.   MRN: [id number]  HPI He has had right knee pain for over two years.  He has swelling, popping and giving way.  He has seen Dr. 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  He has been on pain pills and Naprosyn.  He has no trauma, no redness.  His giving way is getting worse.  He has some left knee pain but no other joint problems.   Review of Systems  Constitutional: Positive for activity change.  Musculoskeletal: Positive for arthralgias, gait problem and joint swelling.  All other systems reviewed and are negative.  Past Medical History:  Diagnosis Date  . Arthritis   . BPH (benign prostatic hyperplasia)   . HOH (hard of hearing)   . Hypertension   . 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[place])    2014    Past Surgical History:  Procedure Laterality Date  . COLONOSCOPY N/A 11/24/2012   Procedure: COLONOSCOPY;  Surgeon: Rogene Houston, MD;  [facility];  Service: Endoscopy;  Laterality: N/A;  830-moved to [place] notified pt  . HEMORROIDECTOMY    . LESION EXCISION N/A 03/23/2012   Procedure: EXCISION NEOPLASM SCALP ;  Surgeon: Jamesetta So, MD;  Location: [facility];  Service: General;  Laterality: N/A;  Excision of Scalp Neoplasm  . ORCHIECTOMY Right 12/20/2012   Procedure: RIGHT RADICAL ORCHIECTOMY/POSSIBLE BX RIGHT TESTICLE;  Surgeon: Marissa Nestle, MD;  Location: [facility];  Service: Urology;  Laterality: Right;  . PROSTATE SURGERY    . SCALP LACERATION REPAIR     [facility]-Dr Tamala Julian    Current Outpatient Medications on File Prior to Visit  Medication Sig Dispense Refill  . amLODipine-benazepril (LOTREL) 10-20 MG per capsule 1 capsule daily.     Marland Kitchen gabapentin (NEURONTIN) 300 MG capsule Take 300 mg by mouth 2 (two) times daily.    . Multiple Vitamins-Minerals (MULTIVITAMIN WITH MINERALS) tablet Take 1 tablet by mouth daily.    . naproxen (NAPROSYN) 500 MG tablet  Take 500 mg by mouth. Takes once a day    . oxycodone (OXY-IR) 5 MG capsule Take 5 mg by mouth every 4 (four) hours as needed.    . pravastatin (PRAVACHOL) 40 MG tablet Take 40 mg by mouth daily.    Marland Kitchen RAPAFLO 8 MG CAPS capsule Take 1 capsule by mouth daily.    . DiphenhydrAMINE HCl (BENADRYL PO) Take 1 capsule by mouth as needed.    . traMADol (ULTRAM) 50 MG tablet 50 mg. Takes one a day     No current facility-administered medications on file prior to visit.     Social History   Socioeconomic History  . Marital status: Single    Spouse name: Not on file  . Number of children: Not on file  . Years of education: Not on file  . Highest education level: Not on file  Occupational History  . Not on file  Social Needs  . Financial resource strain: Not on file  . Food insecurity:    Worry: Not on file    Inability: Not on file  . Transportation needs:    Medical: Not on file    Non-medical: Not on file  Tobacco Use  . Smoking status: Current Every Day Smoker    Packs/day: 0.50    Years: 50.00    Pack years: 25.00  Types: Cigarettes  . Smokeless tobacco: Never Used  Substance and Sexual Activity  . Alcohol use: Yes    Alcohol/week: 1.8 oz    Types: 3 Shots of liquor per week    Comment: 3 mixed  drinks per week  . Drug use: No  . Sexual activity: Yes    Birth control/protection: None  Lifestyle  . Physical activity:    Days per week: Not on file    Minutes per session: Not on file  . Stress: Not on file  Relationships  . Social connections:    Talks on phone: Not on file    Gets together: Not on file    Attends religious service: Not on file    Active member of club or organization: Not on file    Attends meetings of clubs or organizations: Not on file    Relationship status: Not on file  . Intimate partner violence:    Fear of current or ex partner: Not on file    Emotionally abused: Not on file    Physically abused: Not on file    Forced sexual activity: Not on  file  Other Topics Concern  . Not on file  Social History Narrative  . Not on file    Family History  Problem Relation Age of Onset  . Cancer Brother   . Cancer Brother     BP (!) 146/72   Pulse 76   Ht 5\' 11"  (1.803 m)   Wt 191 lb (86.6 kg)   BMI 26.64 kg/m      Objective:   Physical Exam  Constitutional: He is oriented to person, place, and time. He appears well-developed and well-nourished.  HENT:  Head: Normocephalic and atraumatic.  Eyes: Pupils are equal, round, and reactive to light. Conjunctivae and EOM are normal.  Neck: Normal range of motion. Neck supple.  Cardiovascular: Normal rate, regular rhythm and intact distal pulses.  Pulmonary/Chest: Effort normal.  Abdominal: Soft.  Musculoskeletal:       Right knee: He exhibits swelling. Tenderness found. Medial joint line tenderness noted.       Legs: Neurological: He is alert and oriented to person, place, and time. He has normal reflexes. He displays normal reflexes. No cranial nerve deficit. He exhibits normal muscle tone. Coordination normal.  Skin: Skin is warm and dry.  Psychiatric: He has a normal mood and affect. His behavior is normal. Judgment and thought content normal.     X-rays and Dr. Denita Lung notes were reviewed.     Assessment & Plan:   Encounter Diagnosis  Name Primary?  . Chronic pain of right knee Yes   I will get a MRI of the right knee.  Return after the MRI.  Continue present medicine.  Call if any problem.  Precautions discussed.   Electronically Signed Sanjuana Kava, MD 6/12/20192:10 PM

## 2017-06-25 ENCOUNTER — Ambulatory Visit (HOSPITAL_COMMUNITY)
Admission: RE | Admit: 2017-06-25 | Discharge: 2017-06-25 | Disposition: A | Discharge status: Home / Self Care | Payer: Medicare Other | Source: Ambulatory Visit | Attending: Orthopaedic Surgery | Admitting: Orthopaedic Surgery

## 2017-06-25 DIAGNOSIS — M1711 Unilateral primary osteoarthritis, right knee: Secondary | ICD-10-CM | POA: Insufficient documentation

## 2017-06-25 DIAGNOSIS — M7121 Synovial cyst of popliteal space [Baker], right knee: Secondary | ICD-10-CM | POA: Diagnosis not present

## 2017-06-25 DIAGNOSIS — M25561 Pain in right knee: Secondary | ICD-10-CM | POA: Diagnosis not present

## 2017-06-25 DIAGNOSIS — G8929 Other chronic pain: Secondary | ICD-10-CM | POA: Insufficient documentation

## 2017-06-29 ENCOUNTER — Encounter: Payer: Self-pay | Admitting: Orthopaedic Surgery

## 2017-06-29 ENCOUNTER — Ambulatory Visit (INDEPENDENT_AMBULATORY_CARE_PROVIDER_SITE_OTHER): Payer: Medicare Other | Admitting: Orthopaedic Surgery

## 2017-06-29 VITALS — BP 142/70 | HR 71 | Ht 71.0 in | Wt 191.0 lb

## 2017-06-29 DIAGNOSIS — M25561 Pain in right knee: Secondary | ICD-10-CM | POA: Diagnosis not present

## 2017-06-29 DIAGNOSIS — G8929 Other chronic pain: Secondary | ICD-10-CM | POA: Diagnosis not present

## 2017-06-29 NOTE — Progress Notes (Signed)
Patient Nathaniel Hicks, male DOB:[birth year], 77 y.o. ASN:[id number]  Chief Complaint  Patient presents with  . Results    MRI Right Knee    HPI  Nathaniel Hicks is a 77 y.o. male who has right knee pain.  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 Extensive tearing throughout the lateral meniscus is present as described above. Baker's Cyst.  I have explained the findings to him.  I have recommended he see Dr. Aline Brochure for possible surgery.  He is agreeable to this.  His knee locked this morning for a few seconds. HPI  Body mass index is 26.64 kg/m.  ROS  Review of Systems  Constitutional: Positive for activity change.  Musculoskeletal: Positive for arthralgias, gait problem and joint swelling.  All other systems reviewed and are negative.   Past Medical History:  Diagnosis Date  . Arthritis   . BPH (benign prostatic hyperplasia)   . HOH (hard of hearing)   . Hypertension   . 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[place])    2014    Past Surgical History:  Procedure Laterality Date  . COLONOSCOPY N/A 11/24/2012   Procedure: COLONOSCOPY;  Surgeon: Rogene Houston, MD;  [facility];  Service: Endoscopy;  Laterality: N/A;  830-moved to [place] notified pt  . HEMORROIDECTOMY    . LESION EXCISION N/A 03/23/2012   Procedure: EXCISION NEOPLASM SCALP ;  Surgeon: Jamesetta So, MD;  Location: [facility];  Service: General;  Laterality: N/A;  Excision of Scalp Neoplasm  . ORCHIECTOMY Right 12/20/2012   Procedure: RIGHT RADICAL ORCHIECTOMY/POSSIBLE BX RIGHT TESTICLE;  Surgeon: Marissa Nestle, MD;  Location: [facility];  Service: Urology;  Laterality: Right;  . PROSTATE SURGERY    . SCALP LACERATION REPAIR     [facility]-Dr Tamala Julian    Family History  Problem Relation Age of Onset  . Cancer Brother   . Cancer Brother     Social History Social History   Tobacco Use  . Smoking status: Current Every Day Smoker     Packs/day: 0.50    Years: 50.00    Pack years: 25.00    Types: Cigarettes  . Smokeless tobacco: Never Used  Substance Use Topics  . Alcohol use: Yes    Alcohol/week: 1.8 oz    Types: 3 Shots of liquor per week    Comment: 3 mixed  drinks per week  . Drug use: No    No Known Allergies  Current Outpatient Medications  Medication Sig Dispense Refill  . amLODipine-benazepril (LOTREL) 10-20 MG per capsule 1 capsule daily.     . DiphenhydrAMINE HCl (BENADRYL PO) Take 1 capsule by mouth as needed.    . gabapentin (NEURONTIN) 300 MG capsule Take 300 mg by mouth 2 (two) times daily.    . Multiple Vitamins-Minerals (MULTIVITAMIN WITH MINERALS) tablet Take 1 tablet by mouth daily.    . naproxen (NAPROSYN) 500 MG tablet Take 500 mg by mouth. Takes once a day    . oxycodone (OXY-IR) 5 MG capsule Take 5 mg by mouth every 4 (four) hours as needed.    . pravastatin (PRAVACHOL) 40 MG tablet Take 40 mg by mouth daily.    Marland Kitchen RAPAFLO 8 MG CAPS capsule Take 1 capsule by mouth daily.    . traMADol (ULTRAM) 50 MG tablet 50 mg. Takes one a day     No current facility-administered medications for this visit.  Physical Exam  Blood pressure (!) 142/70, pulse 71, height 5\' 11"  (1.803 m), weight 191 lb (86.6 kg).  Constitutional: overall normal hygiene, normal nutrition, well developed, normal grooming, normal body habitus. Assistive device:none  Musculoskeletal: gait and station Limp right, muscle tone and strength are normal, no tremors or atrophy is present.  .  Neurological: coordination overall normal.  Deep tendon reflex/nerve stretch intact.  Sensation normal.  Cranial nerves II-XII intact.   Skin:   Normal overall no scars, lesions, ulcers or rashes. No psoriasis.  Psychiatric: Alert and oriented x 3.  Recent memory intact, remote memory unclear.  Normal mood and affect. Well groomed.  Good eye contact.  Cardiovascular: overall no swelling, no varicosities, no edema bilaterally,  normal temperatures of the legs and arms, no clubbing, cyanosis and good capillary refill.  Lymphatic: palpation is normal.  Right knee is tender laterally, limp, effusion 1+, ROM 0 to 110, positive Lateral [place].  Muscle tone and strength are normal.  All other systems reviewed and are negative   The patient has been educated about the nature of the problem(s) and counseled on treatment options.  The patient appeared to understand what I have discussed and is in agreement with it.  Encounter Diagnosis  Name Primary?  . Chronic pain of right knee Yes    PLAN Call if any problems.  Precautions discussed.  Continue current medications.   Return to clinic to see Dr. Aline Brochure   Electronically Signed Sanjuana Kava, MD 6/18/20191:44 PM

## 2017-07-02 ENCOUNTER — Ambulatory Visit (INDEPENDENT_AMBULATORY_CARE_PROVIDER_SITE_OTHER): Payer: Medicare Other | Admitting: Orthopedic Surgery

## 2017-07-02 ENCOUNTER — Encounter: Payer: Self-pay | Admitting: Orthopedic Surgery

## 2017-07-02 VITALS — BP 155/70 | Ht 71.0 in | Wt 191.0 lb

## 2017-07-02 DIAGNOSIS — M25561 Pain in right knee: Secondary | ICD-10-CM

## 2017-07-02 DIAGNOSIS — G8929 Other chronic pain: Secondary | ICD-10-CM

## 2017-07-02 DIAGNOSIS — M23300 Other meniscus derangements, unspecified lateral meniscus, right knee: Secondary | ICD-10-CM | POA: Diagnosis not present

## 2017-07-02 DIAGNOSIS — M1711 Unilateral primary osteoarthritis, right knee: Secondary | ICD-10-CM

## 2017-07-02 NOTE — Patient Instructions (Signed)
Knee Arthroscopy Knee arthroscopy is a surgical procedure that is used to examine the inside of your knee joint and repair any damage. The surgeon puts a small, lighted instrument with a camera on the tip (arthroscope) through a small incision in your knee. The camera sends pictures to a monitor in the operating room. Your surgeon uses those pictures to guide the surgical instruments through other incisions to the area of damage. Knee arthroscopy can be used to treat many types of knee problems. It may be used:  To repair a torn ligament.  To repair or remove damaged tissue.  To remove a fluid-filled sac (cyst) from your knee.  Tell a health care provider about:  Any allergies you have.  All medicines you are taking, including vitamins, herbs, eye drops, creams, and over-the-counter medicines.  Any problems you or family members have had with anesthetic medicines.  Any blood disorders you have.  Any surgeries you have had.  Any medical conditions you have. What are the risks? Generally, this is a safe procedure. However, problems may occur, including:  Infection.  Bleeding.  Damage to blood vessels, nerves, or structures of your knee.  A blood clot that forms in your leg and travels to your lung.  Failure to relieve symptoms.  What happens before the procedure?  Ask your health care provider about: ? Changing or stopping your regular medicines. This is especially important if you are taking diabetes medicines or blood thinners. ? Taking medicines such as aspirin and ibuprofen. These medicines can thin your blood. Do not take these medicines before your procedure if your health care provider instructs you not to.  Follow your health care provider's instructions about eating or drinking restrictions.  Plan to have someone take you home after the procedure.  If you go home right after the procedure, plan to have someone with you for 24 hours.  Do not drink alcohol unless  your health care provider says that you can.  Do not use any tobacco products, including cigarettes, chewing tobacco, or electronic cigarettes unless your health care provider says that you can. If you need help quitting, ask your health care provider.  You may have a physical exam. What happens during the procedure?  An IV tube will be inserted into one of your veins.  You will be given one or more of the following: ? A medicine that helps you relax (sedative). ? A medicine that numbs the area (local anesthetic). ? A medicine that makes you fall asleep (general anesthetic). ? A medicine that is injected into your spine that numbs the area below and slightly above the injection site (spinal anesthetic). ? A medicine that is injected into an area of your body that numbs everything below the injection site (regional anesthetic).  A cuff may be placed around your upper leg to slow bleeding during the procedure.  The surgeon will make a small number of incisions around your knee.  Your knee joint will be flushed and filled with a germ-free (sterile) solution.  The arthroscope will be passed through an incision into your knee joint.  More instruments will be passed through other incisions to repair your knee as needed.  The fluid will be removed from your knee.  The incisions will be closed with adhesive strips or stitches (sutures).  A bandage (dressing) will be placed over your knee. The procedure may vary among health care providers and hospitals. What happens after the procedure?  Your blood pressure, heart rate, breathing  rate and blood oxygen level will be monitored often until the medicines you were given have worn off.  You may be given medicine for pain.  You may get crutches to help you walk without using your knee to support your body weight.  You may have to wear compression stockings. These stocking help to prevent blood clots and reduce swelling in your legs. This  information is not intended to replace advice given to you by your health care provider. Make sure you discuss any questions you have with your health care provider. Document Released: 12/27/1999 Document Revised: 06/06/2015 Document Reviewed: 12/25/2013 Elsevier Interactive Patient Education  2018 [place].  Total Knee Replacement Total knee replacement is a procedure to replace the knee joint with an artificial (prosthetic) knee joint. The purpose of this surgery is to reduce knee pain and improve knee function. The prosthetic knee joint (prosthesis) may be made of metal, plastic or ceramic. It replaces parts of the thigh bone (femur), lower leg bone (tibia), and kneecap (patella) that are removed during the procedure. Tell a health care provider about:  Any allergies you have.  All medicines you are taking, including vitamins, herbs, eye drops, creams, and over-the-counter medicines.  Any problems you or family members have had with anesthetic medicines.  Any blood disorders you have.  Any surgeries you have had.  Any medical conditions you have.  Whether you are pregnant or may be pregnant. What are the risks? Generally, this is a safe procedure. However, problems may occur, including:  Infection.  Bleeding.  Allergic reactions to medicines.  Damage to other structures or organs.  Decreased range of motion of the knee.  Instability of the knee.  Loosening of the prosthetic joint.  Knee pain that does not go away (chronic pain).  What happens before the procedure?  Ask your health care provider about: ? Changing or stopping your regular medicines. This is especially important if you are taking diabetes medicines or blood thinners. ? Taking medicines such as aspirin and ibuprofen. These medicines can thin your blood. Do not take these medicines before your procedure if your health care provider instructs you not to.  Have dental care and routine cleanings  completed before your procedure. Plan to not have dental work done for 3 months after your procedure. Germs from anywhere in your body, including your mouth, can travel to your new joint and infect it.  Follow instructions from your health care provider about eating or drinking restrictions.  Ask your health care provider how your surgical site will be marked or identified.  You may be given antibiotic medicine to help prevent infection.  If your health care provider prescribes physical therapy, do exercises as instructed.  Do not use any tobacco products, such as cigarettes, chewing tobacco, or e-cigarettes. If you need help quitting, ask your health care provider.  You may have a physical exam.  You may have tests, such as: ? X-rays. ? MRI. ? CT scan. ? Bone scans.  You may have a blood or urine sample taken.  Plan to have someone take you home after the procedure.  If you will be going home right after the procedure, plan to have someone with you for at least 24 hours. It is recommended that you have someone to help care for you for at least 4-6 weeks after your procedure. What happens during the procedure?  To reduce your risk of infection: ? Your health care team will wash or sanitize their hands. ?  Your skin will be washed with soap.  An IV tube will be inserted into one of your veins.  You will be given one or more of the following: ? A medicine to help you relax (sedative). ? A medicine to numb the area (local anesthetic). ? A medicine to make you fall asleep (general anesthetic). ? A medicine that is injected into your spine to numb the area below and slightly above the injection site (spinal anesthetic). ? A medicine that is injected into an area of your body to numb everything below the injection site (regional anesthetic).  An incision will be made in your knee.  Damaged cartilage and bone will be removed from your femur, tibia, and patella.  Parts of the  prosthesis (liners) will be placed over the areas of bone and cartilage that were removed. A metal liner will be placed over your femur, and plastic liners will be placed over your tibia and the underside of your patella.  One or more small tubes (drains) may be placed near your incision to help drain extra fluid from your surgical site.  Your incision will be closed with stitches (sutures), skin glue, or adhesive strips. Medicine may be applied to your incision.  A bandage (dressing) will be placed over your incision. The procedure may vary among health care providers and hospitals. What happens after the procedure?  Your blood pressure, heart rate, breathing rate, and blood oxygen level will be monitored often until the medicines you were given have worn off.  You may continue to receive fluids and medicines through an IV tube.  You will have some pain. Pain medicines will be available to help you.  You may have fluid coming from one or more drains in your incision.  You may have to wear compression stockings. These stockings help to prevent blood clots and reduce swelling in your legs.  You will be encouraged to move around as much as possible.  You may be given a continuous passive motion machine to use at home. You will be shown how to use this machine.  Do not drive for 24 hours if you received a sedative. This information is not intended to replace advice given to you by your health care provider. Make sure you discuss any questions you have with your health care provider. Document Released: 04/06/2000 Document Revised: 02/01/2016 Document Reviewed: 12/05/2014 Elsevier Interactive Patient Education  [employer].

## 2017-07-02 NOTE — Progress Notes (Signed)
PREOP CONSULT/REFERRAL INTRA-OFFICE FROM DR Tyrone Apple   Chief Complaint  Patient presents with  . Knee Pain    right knee pain discuss surgery     MEDICAL DECISION SECTION  xrays ordered? NO  My independent reading of xrays: The plain film shows osteoarthritis of the knee primarily medial side minimal deformity  The MRI shows Baker's cyst with torn lateral meniscus and arthritis more dominant lateral side   Encounter Diagnoses  Name Primary?  . Chronic pain of right knee   . Primary osteoarthritis of right knee   . Meniscus, lateral, derangement, right Yes     PLAN:  Visit was primarily to discuss possible knee replacement surgery with face-to-face contact greater than 50% of the time spent.  Time spent 15 minutes.   I discussed this with the patient and a close friend that was with him.  He says his symptoms are only intermittent he is not sure if he wants to have surgery I offered him both arthroscopic and total knee surgery  We discussed his oxycodone use  We also discussed his problem with his back  He is going to think about this a little more before he decides to have surgery.   No orders of the defined types were placed in this encounter.   Chief Complaint  Patient presents with  . Knee Pain    right knee pain discuss surgery     77 year old male referred for possible surgery on his right knee  He was followed by Dr. Luna Glasgow for quite some time and received injections in his knee but he persisted with right knee pain catching locking giving way.  He eventually had an MRI which showed he had a torn lateral meniscus with osteoarthritis.  He is on oxycodone for 1-1/2 years for various joint pain  This is not controlling the pain in his knee however  He says that his symptoms are intermittent and not constant worse in the wintertime  He is here with a friend who says he is an avid bike rider   Review of Systems  Constitutional: Negative for fever.   Cardiovascular: Negative for chest pain.  Musculoskeletal: Positive for back pain, falls and joint pain.  Neurological: Negative for tingling.     Past Medical History:  Diagnosis Date  . Arthritis   . BPH (benign prostatic hyperplasia)   . HOH (hard of hearing)   . Hypertension   . 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[place])    2014    Past Surgical History:  Procedure Laterality Date  . COLONOSCOPY N/A 11/24/2012   Procedure: COLONOSCOPY;  Surgeon: Rogene Houston, MD;  Location: [facility];  Service: Endoscopy;  Laterality: N/A;  830-moved to [place] notified pt  . HEMORROIDECTOMY    . LESION EXCISION N/A 03/23/2012   Procedure: EXCISION NEOPLASM SCALP ;  Surgeon: Jamesetta So, MD;  Location: [facility];  Service: General;  Laterality: N/A;  Excision of Scalp Neoplasm  . ORCHIECTOMY Right 12/20/2012   Procedure: RIGHT RADICAL ORCHIECTOMY/POSSIBLE BX RIGHT TESTICLE;  Surgeon: Marissa Nestle, MD;  Location: [facility];  Service: Urology;  Laterality: Right;  . PROSTATE SURGERY    . SCALP LACERATION REPAIR     [facility]-Dr Tamala Julian    Family History  Problem Relation Age of Onset  . Cancer Brother   . Cancer Brother    Social History   Tobacco Use  . Smoking status: Current Every Day Smoker    Packs/day: 0.50    Years:  50.00    Pack years: 25.00    Types: Cigarettes  . Smokeless tobacco: Never Used  Substance Use Topics  . Alcohol use: Yes    Alcohol/week: 1.8 oz    Types: 3 Shots of liquor per week    Comment: 3 mixed  drinks per week  . Drug use: No    No Known Allergies   Current Meds  Medication Sig  . amLODipine-benazepril (LOTREL) 10-20 MG per capsule 1 capsule daily.   . DiphenhydrAMINE HCl (BENADRYL PO) Take 1 capsule by mouth as needed.  . gabapentin (NEURONTIN) 300 MG capsule Take 300 mg by mouth 2 (two) times daily.  . Multiple Vitamins-Minerals (MULTIVITAMIN WITH MINERALS) tablet Take 1 tablet by mouth daily.  . naproxen (NAPROSYN) 500 MG tablet Take 500 mg by mouth. Takes  once a day  . oxycodone (OXY-IR) 5 MG capsule Take 5 mg by mouth every 4 (four) hours as needed.  . pravastatin (PRAVACHOL) 40 MG tablet Take 40 mg by mouth daily.  Marland Kitchen RAPAFLO 8 MG CAPS capsule Take 1 capsule by mouth daily.    BP (!) 155/70   Ht 5\' 11"  (1.803 m)   Wt 191 lb (86.6 kg)   BMI 26.64 kg/m   Physical Exam  Constitutional: He is oriented to person, place, and time. He appears well-developed and well-nourished.  Cardiovascular: Intact distal pulses.  Neurological: He is alert and oriented to person, place, and time. He has normal reflexes. He displays normal reflexes. He exhibits normal muscle tone. Coordination normal.  Skin: Skin is warm and dry. No erythema.  Psychiatric: He has a normal mood and affect. His behavior is normal. Judgment and thought content normal.    Ortho Exam  Appears to have valgus alignment to the right knee.  He has lateral joint line tenderness.  Small effusion.  His gait is altered as well.     Arther Abbott, MD 07/02/2017 11:41 AM

## 2017-07-19 ENCOUNTER — Telehealth: Payer: Self-pay | Admitting: Orthopedic Surgery

## 2017-07-19 NOTE — Telephone Encounter (Signed)
Patient and sisters have called with questions regarding scheduling surgery, surgery arthroscopy Vs. Total knee replacement surgery, and also about risks.  Please call either [phone] - sister Leib Elahi or Antony Blackbird [phone]

## 2017-07-20 NOTE — Telephone Encounter (Signed)
Are they on his DPR?

## 2017-07-20 NOTE — Telephone Encounter (Signed)
Discussion of surgery is not an emergency. This is a HIPAA violation. Ok to make appointment for next available and bring the family with him if he wants to discuss surgery.

## 2017-07-20 NOTE — Telephone Encounter (Signed)
They are on emergency contact list, and were with patient at time of visit. The written DPR was given and is to be returned by patient.

## 2017-07-21 NOTE — Telephone Encounter (Signed)
Patient was present at time of this call and I spoke with him as well as family members noted. All had been made aware of the designated party release form as noted, which I mailed to patient 07/20/17. I also called back to patient today, and spoke with him personally to let him know it is on the way to him in mail and that is welcome to come to the office to sign as well if he does not wish to wait for form in mail.  He voiced understanding.

## 2017-08-02 ENCOUNTER — Ambulatory Visit: Payer: Medicare Other | Admitting: Orthopedic Surgery

## 2017-08-02 ENCOUNTER — Telehealth: Payer: Self-pay | Admitting: Orthopedic Surgery

## 2017-08-02 NOTE — Telephone Encounter (Signed)
Questions from patient's primary designated contact, sister [name] - form on file and scanned into chart. Asking about scheduling surgery ; asking about percentage of risk of blood clot, due to patient's age and history. Said both arthroscopic and total knee procedures were discussed. Said patient would like to have arthroscopy in August, as had been discussed. Patient ph# [phone]; LaGretta's ph#'s [phone]) and [phone]([facility])

## 2017-08-03 ENCOUNTER — Other Ambulatory Visit: Payer: Self-pay | Admitting: Orthopedic Surgery

## 2017-08-03 NOTE — Telephone Encounter (Signed)
Called to advise. And to get surgery dates he would like to proceed on 08/31/17 have put in orders. Also advised as Dr Aline Brochure has blood clot is always a risk, unable to put a percentage on it, we will monitor him for this following surgery.

## 2017-08-03 NOTE — Telephone Encounter (Signed)
Ok to schedule SARK LATERAL MENISECTOMY

## 2017-08-23 NOTE — Patient Instructions (Signed)
ANDRUE Hicks  08/23/2017     @PREFPERIOPPHARMACY @   Your procedure is scheduled on  08/31/2017 .  Report to [facility] at  1020  A.M.  Call this number if you have problems the morning of surgery:  [phone]   Remember:  Do not eat or drink after midnight.  You may drink clear liquids until  12 midnight 08/30/2017 .  Clear liquids allowed are:                    Water, Juice (non-citric and without pulp), Carbonated beverages, Clear Tea, Black Coffee only, Plain Jell-O only, Gatorade and Plain Popsicles only    Take these medicines the morning of surgery with A SIP OF WATER  Amlodipine, neurontin, oxycodone, rapaflo.    Do not wear jewelry, make-up or nail polish.  Do not wear lotions, powders, or perfumes, or deodorant.  Do not shave 48 hours prior to surgery.  Men may shave face and neck.  Do not bring valuables to the hospital.  [facility] is not responsible for any belongings or valuables.  Contacts, dentures or bridgework may not be worn into surgery.  Leave your suitcase in the car.  After surgery it may be brought to your room.  For patients admitted to the hospital, discharge time will be determined by your treatment team.  Patients discharged the day of surgery will not be allowed to drive home.   Name and phone number of your driver:   family Special instructions:  None  Please read over the following fact sheets that you were given. Anesthesia Post-op Instructions and Care and Recovery After Surgery      Knee Ligament Injury, Arthroscopy Arthroscopy is a surgical technique in which your health care provider examines your knee through a small, pencil-sized telescope (arthroscope). Often, repairs to injured ligaments can be done with instruments in the arthroscope. Arthroscopy is less invasive than open-knee surgery. Tell a health care provider about:  Any allergies you have.  All medicines you are taking, including vitamins, herbs, eye  drops, creams, and over-the-counter medicines.  Any problems you or family members have had with anesthetic medicines.  Any blood disorders you have.  Any surgeries you have had.  Any medical conditions you have. What are the risks? Generally, this is a safe procedure. However, as with any procedure, problems can occur. Possible problems include:  Infection.  Bleeding.  Stiffness.  What happens before the procedure?  Ask your health care provider about changing or stopping any regular medicines. Avoid taking aspirin or blood thinners as directed by your health care provider.  Do not eat or drink anything after midnight the night before surgery.  If you smoke, do not smoke for at least 2 weeks before your surgery.  Do not drink alcohol starting the day before your surgery.  Let your health care provider know if you develop a cold or any infection before your surgery.  Arrange for someone to drive you home after the surgery or after your hospital stay. Also arrange for someone to help you with activities during recovery. What happens during the procedure?  Small monitors will be put on your body. They are used to check your heart, blood pressure, and oxygen levels.  An IV access tube will be put into one of your veins. Medicine will be able to flow directly into your body through this IV tube.  You might be given  a medicine to help you relax (sedative).  You will be given a medicine that makes you go to sleep (general anesthetic), and a breathing tube will be placed into your lungs during the procedure.  Several small incisions are made in your knee. Saline fluid is placed into one of the incisions to expand the knee and clear away any blood in the knee.  Your health care provider will insert the arthroscope to examine the injured knee.  During arthroscopy, your health care provider may find a partial or complete tear in a ligament.  Tools can be inserted through the  other incisions to repair the injured ligaments.  The incisions are then closed with absorbable stitches and covered with dressings. What happens after the procedure?  You will be taken to the recovery area where you will be monitored.  When you are awake, stable, and taking fluids without problems, you will be allowed to go home. This information is not intended to replace advice given to you by your health care provider. Make sure you discuss any questions you have with your health care provider. Document Released: 12/27/1999 Document Revised: 06/06/2015 Document Reviewed: 08/10/2012 Elsevier Interactive Patient Education  2017 [employer].  Arthroscopic Knee Ligament Repair, Care After This sheet gives you information about how to care for yourself after your procedure. Your health care provider may also give you more specific instructions. If you have problems or questions, contact your health care provider. What can I expect after the procedure? After the procedure, it is common to have:  Pain in your knee.  Bruising and swelling on your knee, calf, and ankle for 3-4 days.  Fatigue.  Follow these instructions at home: If you have a brace or immobilizer:  Wear the brace or immobilizer as told by your health care provider. Remove it only as told by your health care provider.  Loosen the splint or immobilizer if your toes tingle, become numb, or turn cold and blue.  Keep the brace or immobilizer clean. Bathing  Do not take baths, swim, or use a hot tub until your health care provider approves. Ask your health care provider if you can take showers.  Keep your bandage (dressing) dry until your health care provider says that it can be removed. Cover it and your brace or immobilizer with a watertight covering when you take a shower. Incision care  Follow instructions from your health care provider about how to take care of your incision. Make sure you: ? Wash your hands with  soap and water before you change your bandage (dressing). If soap and water are not available, use hand sanitizer. ? Change your dressing as told by your health care provider. ? Leave stitches (sutures), skin glue, or adhesive strips in place. These skin closures may need to stay in place for 2 weeks or longer. If adhesive strip edges start to loosen and curl up, you may trim the loose edges. Do not remove adhesive strips completely unless your health care provider tells you to do that.  Check your incision area every day for signs of infection. Check for: ? More redness, swelling, or pain. ? More fluid or blood. ? Warmth. ? Pus or a bad smell. Managing pain, stiffness, and swelling  If directed, put ice on the affected area. ? If you have a removable brace or immobilizer, remove it as told by your health care provider. ? Put ice in a plastic bag. ? Place a towel between your skin and the  bag or between your brace or immobilizer and the bag. ? Leave the ice on for 20 minutes, 2-3 times a day.  Move your toes often to avoid stiffness and to lessen swelling.  Raise (elevate) the injured area above the level of your heart while you are sitting or lying down. Driving  Do not drive until your health care provider approves. If you have a brace or immobilizer on your leg, ask your health care provider when it is safe for you to drive.  Do not drive or use heavy machinery while taking prescription pain medicine. Activity  Rest as directed. Ask your health care provider what activities are safe for you.  Do physical therapy exercises as told by your health care provider. Physical therapy will help you regain strength and motion in your knee.  Follow instructions from your health care provider about: ? When you may start motion exercises. ? When you may start riding a stationary bike and doing other low-impact activities. ? When you may start to jog and do other high-impact  activities. Safety  Do not use the injured limb to support your body weight until your health care provider says that you can. Use crutches as told by your health care provider. General instructions  Do not use any products that contain nicotine or tobacco, such as cigarettes and e-cigarettes. These can delay bone healing. If you need help quitting, ask your health care provider.  To prevent or treat constipation while you are taking prescription pain medicine, your health care provider may recommend that you: ? Drink enough fluid to keep your urine clear or pale yellow. ? Take over-the-counter or prescription medicines. ? Eat foods that are high in fiber, such as fresh fruits and vegetables, whole grains, and beans. ? Limit foods that are high in fat and processed sugars, such as fried and sweet foods.  Take over-the-counter and prescription medicines only as told by your health care provider.  Keep all follow-up visits as told by your health care provider. This is important. Contact a health care provider if:  You have more redness, swelling, or pain around an incision.  You have more fluid or blood coming from an incision.  Your incision feels warm to the touch.  You have a fever.  You have pain or swelling in your knee, and it gets worse.  You have pain that does not get better with medicine. Get help right away if:  You have trouble breathing.  You have pus or a bad smell coming from an incision.  You have numbness and tingling near the knee joint. Summary  After the procedure, it is common to have knee pain with bruising and swelling on your knee, calf, and ankle.  Icing your knee and raising your leg above the level of your heart will help control the pain and the swelling.  Do physical therapy exercises as told by your health care provider. Physical therapy will help you regain strength and motion in your knee. This information is not intended to replace advice  given to you by your health care provider. Make sure you discuss any questions you have with your health care provider. Document Released: 10/19/2012 Document Revised: 12/24/2015 Document Reviewed: 12/24/2015 Elsevier Interactive Patient Education  2017 [place] Anesthesia, Adult General anesthesia is the use of medicines to make a person "go to sleep" (be unconscious) for a medical procedure. General anesthesia is often recommended when a procedure:  Is long.  Requires you  to be still or in an unusual position.  Is major and can cause you to lose blood.  Is impossible to do without general anesthesia.  The medicines used for general anesthesia are called general anesthetics. In addition to making you sleep, the medicines:  Prevent pain.  Control your blood pressure.  Relax your muscles.  Tell a health care provider about:  Any allergies you have.  All medicines you are taking, including vitamins, herbs, eye drops, creams, and over-the-counter medicines.  Any problems you or family members have had with anesthetic medicines.  Types of anesthetics you have had in the past.  Any bleeding disorders you have.  Any surgeries you have had.  Any medical conditions you have.  Any history of heart or lung conditions, such as heart failure, sleep apnea, or chronic obstructive pulmonary disease (COPD).  Whether you are pregnant or may be pregnant.  Whether you use tobacco, alcohol, marijuana, or street drugs.  Any history of Armed forces logistics/support/administrative officer.  Any history of depression or anxiety. What are the risks? Generally, this is a safe procedure. However, problems may occur, including:  Allergic reaction to anesthetics.  Lung and heart problems.  Inhaling food or liquids from your stomach into your lungs (aspiration).  Injury to nerves.  Waking up during your procedure and being unable to move (rare).  Extreme agitation or a state of mental confusion (delirium)  when you wake up from the anesthetic.  Air in the bloodstream, which can lead to stroke.  These problems are more likely to develop if you are having a major surgery or if you have an advanced medical condition. You can prevent some of these complications by answering all of your health care provider's questions thoroughly and by following all pre-procedure instructions. General anesthesia can cause side effects, including:  Nausea or vomiting  A sore throat from the breathing tube.  Feeling cold or shivery.  Feeling tired, washed out, or achy.  Sleepiness or drowsiness.  Confusion or agitation.  What happens before the procedure? Staying hydrated Follow instructions from your health care provider about hydration, which may include:  Up to 2 hours before the procedure - you may continue to drink clear liquids, such as water, clear fruit juice, black coffee, and plain tea.  Eating and drinking restrictions Follow instructions from your health care provider about eating and drinking, which may include:  8 hours before the procedure - stop eating heavy meals or foods such as meat, fried foods, or fatty foods.  6 hours before the procedure - stop eating light meals or foods, such as toast or cereal.  6 hours before the procedure - stop drinking milk or drinks that contain milk.  2 hours before the procedure - stop drinking clear liquids.  Medicines  Ask your health care provider about: ? Changing or stopping your regular medicines. This is especially important if you are taking diabetes medicines or blood thinners. ? Taking medicines such as aspirin and ibuprofen. These medicines can thin your blood. Do not take these medicines before your procedure if your health care provider instructs you not to. ? Taking new dietary supplements or medicines. Do not take these during the week before your procedure unless your health care provider approves them.  If you are told to take a  medicine or to continue taking a medicine on the day of the procedure, take the medicine with sips of water. General instructions   Ask if you will be going home the same day,  the following day, or after a longer hospital stay. ? Plan to have someone take you home. ? Plan to have someone stay with you for the first 24 hours after you leave the hospital or clinic.  For 3-6 weeks before the procedure, try not to use any tobacco products, such as cigarettes, chewing tobacco, and e-cigarettes.  You may brush your teeth on the morning of the procedure, but make sure to spit out the toothpaste. What happens during the procedure?  You will be given anesthetics through a mask and through an IV tube in one of your veins.  You may receive medicine to help you relax (sedative).  As soon as you are asleep, a breathing tube may be used to help you breathe.  An anesthesia specialist will stay with you throughout the procedure. He or she will help keep you comfortable and safe by continuing to give you medicines and adjusting the amount of medicine that you get. He or she will also watch your blood pressure, pulse, and oxygen levels to make sure that the anesthetics do not cause any problems.  If a breathing tube was used to help you breathe, it will be removed before you wake up. The procedure may vary among health care providers and hospitals. What happens after the procedure?  You will wake up, often slowly, after the procedure is complete, usually in a recovery area.  Your blood pressure, heart rate, breathing rate, and blood oxygen level will be monitored until the medicines you were given have worn off.  You may be given medicine to help you calm down if you feel anxious or agitated.  If you will be going home the same day, your health care provider may check to make sure you can stand, drink, and urinate.  Your health care providers will treat your pain and side effects before you go  home.  Do not drive for 24 hours if you received a sedative.  You may: ? Feel nauseous and vomit. ? Have a sore throat. ? Have mental slowness. ? Feel cold or shivery. ? Feel sleepy. ? Feel tired. ? Feel sore or achy, even in parts of your body where you did not have surgery. This information is not intended to replace advice given to you by your health care provider. Make sure you discuss any questions you have with your health care provider. Document Released: 04/07/2007 Document Revised: 06/11/2015 Document Reviewed: 12/13/2014 Elsevier Interactive Patient Education  2018 [place] Anesthesia, Adult, Care After These instructions provide you with information about caring for yourself after your procedure. Your health care provider may also give you more specific instructions. Your treatment has been planned according to current medical practices, but problems sometimes occur. Call your health care provider if you have any problems or questions after your procedure. What can I expect after the procedure? After the procedure, it is common to have:  Vomiting.  A sore throat.  Mental slowness.  It is common to feel:  Nauseous.  Cold or shivery.  Sleepy.  Tired.  Sore or achy, even in parts of your body where you did not have surgery.  Follow these instructions at home: For at least 24 hours after the procedure:  Do not: ? Participate in activities where you could fall or become injured. ? Drive. ? Use heavy machinery. ? Drink alcohol. ? Take sleeping pills or medicines that cause drowsiness. ? Make important decisions or sign legal documents. ? Take care of children on  your own.  Rest. Eating and drinking  If you vomit, drink water, juice, or soup when you can drink without vomiting.  Drink enough fluid to keep your urine clear or pale yellow.  Make sure you have little or no nausea before eating solid foods.  Follow the diet recommended by your  health care provider. General instructions  Have a responsible adult stay with you until you are awake and alert.  Return to your normal activities as told by your health care provider. Ask your health care provider what activities are safe for you.  Take over-the-counter and prescription medicines only as told by your health care provider.  If you smoke, do not smoke without supervision.  Keep all follow-up visits as told by your health care provider. This is important. Contact a health care provider if:  You continue to have nausea or vomiting at home, and medicines are not helpful.  You cannot drink fluids or start eating again.  You cannot urinate after 8-12 hours.  You develop a skin rash.  You have fever.  You have increasing redness at the site of your procedure. Get help right away if:  You have difficulty breathing.  You have chest pain.  You have unexpected bleeding.  You feel that you are having a life-threatening or urgent problem. This information is not intended to replace advice given to you by your health care provider. Make sure you discuss any questions you have with your health care provider. Document Released: 04/06/2000 Document Revised: 06/03/2015 Document Reviewed: 12/13/2014 Elsevier Interactive Patient Education  [employer].

## 2017-08-26 ENCOUNTER — Other Ambulatory Visit: Payer: Self-pay

## 2017-08-26 ENCOUNTER — Encounter (HOSPITAL_COMMUNITY)
Admission: RE | Admit: 2017-08-26 | Discharge: 2017-08-26 | Disposition: A | Discharge status: Home / Self Care | Payer: Medicare Other | Source: Ambulatory Visit | Attending: Orthopedic Surgery | Admitting: Orthopedic Surgery

## 2017-08-26 ENCOUNTER — Encounter (HOSPITAL_COMMUNITY): Payer: Self-pay

## 2017-08-26 DIAGNOSIS — R9431 Abnormal electrocardiogram [ECG] [EKG]: Secondary | ICD-10-CM | POA: Diagnosis not present

## 2017-08-26 DIAGNOSIS — Z01812 Encounter for preprocedural laboratory examination: Secondary | ICD-10-CM | POA: Insufficient documentation

## 2017-08-26 DIAGNOSIS — I517 Cardiomegaly: Secondary | ICD-10-CM | POA: Diagnosis not present

## 2017-08-26 LAB — BASIC METABOLIC PANEL
ANION GAP: 6 (ref 5–15)
BUN: 18 mg/dL (ref 8–23)
CALCIUM: 10 mg/dL (ref 8.9–10.3)
CO2: 25 mmol/L (ref 22–32)
Chloride: 109 mmol/L (ref 98–111)
Creatinine, Ser: 0.94 mg/dL (ref 0.61–1.24)
GFR calc Af Amer: >60 mL/min (ref >60)
GLUCOSE: 112 mg/dL — AB (ref 70–99)
POTASSIUM: 3.7 mmol/L (ref 3.5–5.1)
SODIUM: 140 mmol/L (ref 135–145)

## 2017-08-26 LAB — CBC WITH DIFFERENTIAL/PLATELET
BASOS PCT: 1 %
Basophils Absolute: 0 10*3/uL (ref 0.0–0.1)
EOS PCT: 3 %
Eosinophils Absolute: 0.2 10*3/uL (ref 0.0–0.7)
HCT: 39.2 % (ref 39.0–52.0)
Hemoglobin: 12.5 g/dL — ABNORMAL LOW (ref 13.0–17.0)
LYMPHS PCT: 37 %
Lymphs Abs: 2 10*3/uL (ref 0.7–4.0)
MCH: 32.8 pg (ref 26.0–34.0)
MCHC: 31.9 g/dL (ref 30.0–36.0)
MCV: 102.9 fL — ABNORMAL HIGH (ref 78.0–100.0)
MONO ABS: 0.6 10*3/uL (ref 0.1–1.0)
Monocytes Relative: 10 %
NEUTROS ABS: 2.6 10*3/uL (ref 1.7–7.7)
Neutrophils Relative %: 49 %
PLATELETS: 236 10*3/uL (ref 150–400)
RBC: 3.81 MIL/uL — AB (ref 4.22–5.81)
RDW: 13.5 % (ref 11.5–15.5)
WBC: 5.4 10*3/uL (ref 4.0–10.5)

## 2017-08-30 ENCOUNTER — Telehealth: Payer: Self-pay | Admitting: Orthopedic Surgery

## 2017-08-30 DIAGNOSIS — M1711 Unilateral primary osteoarthritis, right knee: Secondary | ICD-10-CM

## 2017-08-30 NOTE — H&P (Signed)
PREOP CONSULT/REFERRAL INTRA-OFFICE FROM DR Tyrone Apple         Chief Complaint  Patient presents with  . Knee Pain      right knee pain discuss surgery       MEDICAL DECISION SECTION  xrays ordered? NO   My independent reading of xrays: The plain film shows osteoarthritis of the knee primarily medial side minimal deformity   The MRI shows Baker's cyst with torn lateral meniscus and arthritis more dominant lateral side         Encounter Diagnoses  Name Primary?  . Chronic pain of right knee    . Primary osteoarthritis of right knee    . Meniscus, lateral, derangement, right Yes        PLAN:  Patient initially opted for no surgery but then called back to the office at a later date and asked for arthroscopic surgery of the right knee with a partial lateral meniscectomy understanding that he does have significant arthritis and we will not get complete pain relief from this procedure   We discussed his oxycodone use   We also discussed his problem with his back         Chief Complaint  Patient presents with  . Knee Pain      right knee pain discuss surgery       77 year old male referred for possible surgery on his right knee   He was followed by Dr. Luna Glasgow for quite some time and received injections in his knee but he persisted with right knee pain catching locking giving way.   He eventually had an MRI which showed he had a torn lateral meniscus with osteoarthritis.   He is on oxycodone for 1-1/2 years for various joint pain   This is not controlling the pain in his knee however   He says that his symptoms are intermittent and not constant worse in the wintertime   He is here with a friend who says he is an avid bike rider     Review of Systems  Constitutional: Negative for fever.  Cardiovascular: Negative for chest pain.  Musculoskeletal: Positive for back pain, falls and joint pain.  Neurological: Negative for tingling.            Past Medical  History:  Diagnosis Date  . Arthritis    . BPH (benign prostatic hyperplasia)    . HOH (hard of hearing)    . Hypertension    . 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[place])      2014           Past Surgical History:  Procedure Laterality Date  . COLONOSCOPY N/A 11/24/2012    Procedure: COLONOSCOPY;  Surgeon: Rogene Houston, MD;  Location: [facility] SUITE;  Service: Endoscopy;  Laterality: N/A;  830-moved to [place] notified pt  . HEMORROIDECTOMY      . LESION EXCISION N/A 03/23/2012    Procedure: EXCISION NEOPLASM SCALP ;  Surgeon: Jamesetta So, MD;  Location: [facility] ORS;  Service: General;  Laterality: N/A;  Excision of Scalp Neoplasm  . ORCHIECTOMY Right 12/20/2012    Procedure: RIGHT RADICAL ORCHIECTOMY/POSSIBLE BX RIGHT TESTICLE;  Surgeon: Marissa Nestle, MD;  Location: [facility] ORS;  Service: Urology;  Laterality: Right;  . PROSTATE SURGERY      . SCALP LACERATION REPAIR        [facility]-Dr Tamala Julian           Family History  Problem Relation Age of Onset  . Cancer  Brother    . Cancer Brother      Social History         Tobacco Use  . Smoking status: Current Every Day Smoker      Packs/day: 0.50      Years: 50.00      Pack years: 25.00      Types: Cigarettes  . Smokeless tobacco: Never Used  Substance Use Topics  . Alcohol use: Yes      Alcohol/week: 1.8 oz      Types: 3 Shots of liquor per week      Comment: 3 mixed  drinks per week  . Drug use: No      No Known Allergies     Active Medications      Current Meds  Medication Sig  . amLODipine-benazepril (LOTREL) 10-20 MG per capsule 1 capsule daily.   . DiphenhydrAMINE HCl (BENADRYL PO) Take 1 capsule by mouth as needed.  . gabapentin (NEURONTIN) 300 MG capsule Take 300 mg by mouth 2 (two) times daily.  . Multiple Vitamins-Minerals (MULTIVITAMIN WITH MINERALS) tablet Take 1 tablet by mouth daily.  . naproxen (NAPROSYN) 500 MG tablet Take 500 mg by mouth. Takes once a day  . oxycodone (OXY-IR) 5 MG capsule Take 5 mg by mouth every 4 (four)  hours as needed.  . pravastatin (PRAVACHOL) 40 MG tablet Take 40 mg by mouth daily.  Marland Kitchen RAPAFLO 8 MG CAPS capsule Take 1 capsule by mouth daily.        BP (!) 155/70   Ht 5\' 11"  (1.803 m)   Wt 191 lb (86.6 kg)   BMI 26.64 kg/m    Physical Exam  Constitutional: He is oriented to person, place, and time. He appears well-developed and well-nourished.  Cardiovascular: Intact distal pulses.  Neurological: He is alert and oriented to person, place, and time. He has normal reflexes. He displays normal reflexes. He exhibits normal muscle tone. Coordination normal.  Skin: Skin is warm and dry. No erythema.  Psychiatric: He has a normal mood and affect. His behavior is normal. Judgment and thought content normal.      Ortho Exam   Appears to have valgus alignment to the right knee.  He has lateral joint line tenderness.  Small effusion.   His gait is altered as well.

## 2017-08-30 NOTE — Telephone Encounter (Signed)
Ms. Dube called stating that when the patient went for his pre-op appointment they were told he would probably need a cane to use after surgery tomorrow. She is asking if she has to come here to get a RX for it or what does she need to do.   Please call and advise

## 2017-08-30 NOTE — Telephone Encounter (Signed)
Needs Walker  Can send order to [facility]  Have advised will send order now / have faxed

## 2017-08-31 ENCOUNTER — Encounter (HOSPITAL_COMMUNITY): Payer: Self-pay

## 2017-08-31 ENCOUNTER — Ambulatory Visit (HOSPITAL_COMMUNITY): Payer: Medicare Other | Admitting: Anesthesiology

## 2017-08-31 ENCOUNTER — Encounter (HOSPITAL_COMMUNITY)
Admission: RE | Disposition: A | Discharge status: Home / Self Care | Payer: Self-pay | Source: Ambulatory Visit | Attending: Orthopedic Surgery

## 2017-08-31 ENCOUNTER — Encounter: Payer: Self-pay | Admitting: Orthopedic Surgery

## 2017-08-31 ENCOUNTER — Ambulatory Visit (HOSPITAL_COMMUNITY)
Admission: RE | Admit: 2017-08-31 | Discharge: 2017-08-31 | Disposition: A | Discharge status: Home / Self Care | Payer: Medicare Other | Source: Ambulatory Visit | Attending: Orthopedic Surgery | Admitting: Orthopedic Surgery

## 2017-08-31 DIAGNOSIS — X58XXXA Exposure to other specified factors, initial encounter: Secondary | ICD-10-CM | POA: Insufficient documentation

## 2017-08-31 DIAGNOSIS — Z79899 Other long term (current) drug therapy: Secondary | ICD-10-CM | POA: Insufficient documentation

## 2017-08-31 DIAGNOSIS — I10 Essential (primary) hypertension: Secondary | ICD-10-CM | POA: Diagnosis not present

## 2017-08-31 DIAGNOSIS — S83281A Other tear of lateral meniscus, current injury, right knee, initial encounter: Secondary | ICD-10-CM | POA: Diagnosis not present

## 2017-08-31 DIAGNOSIS — M23300 Other meniscus derangements, unspecified lateral meniscus, right knee: Secondary | ICD-10-CM | POA: Diagnosis not present

## 2017-08-31 DIAGNOSIS — N400 Enlarged prostate without lower urinary tract symptoms: Secondary | ICD-10-CM | POA: Diagnosis not present

## 2017-08-31 DIAGNOSIS — F17210 Nicotine dependence, cigarettes, uncomplicated: Secondary | ICD-10-CM | POA: Insufficient documentation

## 2017-08-31 DIAGNOSIS — M1711 Unilateral primary osteoarthritis, right knee: Secondary | ICD-10-CM | POA: Diagnosis present

## 2017-08-31 DIAGNOSIS — Y929 Unspecified place or not applicable: Secondary | ICD-10-CM | POA: Insufficient documentation

## 2017-08-31 HISTORY — PX: KNEE ARTHROSCOPY WITH LATERAL MENISECTOMY: SHX6193

## 2017-08-31 SURGERY — ARTHROSCOPY, KNEE, WITH LATERAL MENISCECTOMY
Anesthesia: General | Site: Knee | Laterality: Right

## 2017-08-31 MED ORDER — ONDANSETRON HCL 4 MG/2ML IJ SOLN
4.0000 mg | Freq: Once | INTRAMUSCULAR | Status: AC
  Administered 2017-08-31: 4 mg via INTRAVENOUS

## 2017-08-31 MED ORDER — EPINEPHRINE PF 1 MG/ML IJ SOLN
INTRAMUSCULAR | Status: AC
  Filled 2017-08-31: qty 5

## 2017-08-31 MED ORDER — IBUPROFEN 400 MG PO TABS
ORAL_TABLET | ORAL | Status: AC
  Filled 2017-08-31: qty 1

## 2017-08-31 MED ORDER — HYDROCODONE-ACETAMINOPHEN 7.5-325 MG PO TABS: 1.0000 | ORAL_TABLET | Freq: Once | ORAL | Status: DC | PRN

## 2017-08-31 MED ORDER — MIDAZOLAM HCL 2 MG/2ML IJ SOLN
INTRAMUSCULAR | Status: AC
  Filled 2017-08-31: qty 2

## 2017-08-31 MED ORDER — ONDANSETRON HCL 4 MG/2ML IJ SOLN
INTRAMUSCULAR | Status: AC
  Filled 2017-08-31: qty 2

## 2017-08-31 MED ORDER — PROMETHAZINE HCL 25 MG/ML IJ SOLN: 6.2500 mg | INTRAMUSCULAR | Status: DC | PRN

## 2017-08-31 MED ORDER — BUPIVACAINE-EPINEPHRINE (PF) 0.5% -1:200000 IJ SOLN
INTRAMUSCULAR | Status: DC | PRN
  Administered 2017-08-31: 60 mL via PERINEURAL

## 2017-08-31 MED ORDER — SODIUM CHLORIDE 0.9 % IR SOLN
Status: DC | PRN
  Administered 2017-08-31 (×3): 3000 mL

## 2017-08-31 MED ORDER — IBUPROFEN 400 MG PO TABS
400.0000 mg | ORAL_TABLET | Freq: Once | ORAL | Status: AC
  Administered 2017-08-31: 400 mg via ORAL

## 2017-08-31 MED ORDER — ACETAMINOPHEN 500 MG PO TABS
500.0000 mg | ORAL_TABLET | Freq: Once | ORAL | Status: AC
  Administered 2017-08-31: 500 mg via ORAL

## 2017-08-31 MED ORDER — EPHEDRINE SULFATE 50 MG/ML IJ SOLN
INTRAMUSCULAR | Status: DC | PRN
  Administered 2017-08-31: 5 mg via INTRAVENOUS
  Administered 2017-08-31: 10 mg via INTRAVENOUS
  Administered 2017-08-31: 5 mg via INTRAVENOUS

## 2017-08-31 MED ORDER — ONDANSETRON HCL 4 MG/2ML IJ SOLN
INTRAMUSCULAR | Status: DC | PRN
  Administered 2017-08-31: 4 mg via INTRAVENOUS

## 2017-08-31 MED ORDER — GLYCOPYRROLATE 0.2 MG/ML IJ SOLN
INTRAMUSCULAR | Status: DC | PRN
  Administered 2017-08-31: 0.2 mg via INTRAVENOUS

## 2017-08-31 MED ORDER — HYDROMORPHONE HCL 1 MG/ML IJ SOLN: 0.2500 mg | INTRAMUSCULAR | Status: DC | PRN

## 2017-08-31 MED ORDER — LACTATED RINGERS IV SOLN: INTRAVENOUS | Status: DC

## 2017-08-31 MED ORDER — PROPOFOL 10 MG/ML IV BOLUS
INTRAVENOUS | Status: AC
  Filled 2017-08-31: qty 20

## 2017-08-31 MED ORDER — MEPERIDINE HCL 50 MG/ML IJ SOLN: 6.2500 mg | INTRAMUSCULAR | Status: DC | PRN

## 2017-08-31 MED ORDER — KETOROLAC TROMETHAMINE 30 MG/ML IJ SOLN
30.0000 mg | Freq: Once | INTRAMUSCULAR | Status: AC
  Administered 2017-08-31: 30 mg via INTRAVENOUS

## 2017-08-31 MED ORDER — CEFAZOLIN SODIUM-DEXTROSE 2-4 GM/100ML-% IV SOLN
2.0000 g | INTRAVENOUS | Status: AC
  Administered 2017-08-31: 2 g via INTRAVENOUS
  Filled 2017-08-31: qty 100

## 2017-08-31 MED ORDER — BUPIVACAINE-EPINEPHRINE (PF) 0.5% -1:200000 IJ SOLN
INTRAMUSCULAR | Status: AC
  Filled 2017-08-31: qty 60

## 2017-08-31 MED ORDER — KETOROLAC TROMETHAMINE 30 MG/ML IJ SOLN
INTRAMUSCULAR | Status: AC
  Filled 2017-08-31: qty 1

## 2017-08-31 MED ORDER — CHLORHEXIDINE GLUCONATE 4 % EX LIQD: 60.0000 mL | Freq: Once | CUTANEOUS | Status: DC

## 2017-08-31 MED ORDER — ACETAMINOPHEN 500 MG PO TABS
ORAL_TABLET | ORAL | Status: AC
  Filled 2017-08-31: qty 1

## 2017-08-31 MED ORDER — FENTANYL CITRATE (PF) 100 MCG/2ML IJ SOLN
INTRAMUSCULAR | Status: AC
  Filled 2017-08-31: qty 2

## 2017-08-31 MED ORDER — MIDAZOLAM HCL 5 MG/5ML IJ SOLN
INTRAMUSCULAR | Status: DC | PRN
  Administered 2017-08-31 (×2): 1 mg via INTRAVENOUS

## 2017-08-31 MED ORDER — LACTATED RINGERS IV SOLN
INTRAVENOUS | Status: DC
  Administered 2017-08-31: 11:00:00 via INTRAVENOUS

## 2017-08-31 MED ORDER — FENTANYL CITRATE (PF) 100 MCG/2ML IJ SOLN
INTRAMUSCULAR | Status: DC | PRN
  Administered 2017-08-31 (×2): 50 ug via INTRAVENOUS

## 2017-08-31 MED ORDER — PROPOFOL 10 MG/ML IV BOLUS
INTRAVENOUS | Status: DC | PRN
  Administered 2017-08-31 (×2): 20 mg via INTRAVENOUS
  Administered 2017-08-31: 120 mg via INTRAVENOUS

## 2017-08-31 SURGICAL SUPPLY — 52 items
ARTHROWAND PARAGON T2 (SURGICAL WAND)
BANDAGE ELASTIC 6 LF NS (GAUZE/BANDAGES/DRESSINGS) ×3 IMPLANT
BLADE 11 SAFETY STRL DISP (BLADE) ×3 IMPLANT
BLADE AGGRESSIVE PLUS 4.0 (BLADE) ×3 IMPLANT
BNDG CMPR MED 5X6 ELC HKLP NS (GAUZE/BANDAGES/DRESSINGS) ×1
CHLORAPREP W/TINT 26ML (MISCELLANEOUS) ×6 IMPLANT
CLOTH BEACON ORANGE TIMEOUT ST (SAFETY) ×3 IMPLANT
COOLER CRYO IC GRAV AND TUBE (ORTHOPEDIC SUPPLIES) ×3 IMPLANT
CUFF CRYO KNEE18X23 MED (MISCELLANEOUS) ×2 IMPLANT
CUFF TOURNIQUET SINGLE 34IN LL (TOURNIQUET CUFF) ×2 IMPLANT
CUTTER ANGLED DBL BITE 4.5 (BURR) IMPLANT
DECANTER SPIKE VIAL GLASS SM (MISCELLANEOUS) ×6 IMPLANT
GAUZE 4X4 16PLY RFD (DISPOSABLE) ×3 IMPLANT
GAUZE SPONGE 4X4 12PLY STRL (GAUZE/BANDAGES/DRESSINGS) ×3 IMPLANT
GAUZE XEROFORM 5X9 LF (GAUZE/BANDAGES/DRESSINGS) ×3 IMPLANT
GLOVE BIO SURGEON STRL SZ7 (GLOVE) ×2 IMPLANT
GLOVE BIOGEL PI IND STRL 7.0 (GLOVE) ×1 IMPLANT
GLOVE BIOGEL PI INDICATOR 7.0 (GLOVE) ×4
GLOVE SKINSENSE NS SZ8.0 LF (GLOVE) ×2
GLOVE SKINSENSE STRL SZ8.0 LF (GLOVE) ×1 IMPLANT
GLOVE SS N UNI LF 8.5 STRL (GLOVE) ×3 IMPLANT
GOWN STRL REUS W/TWL LRG LVL3 (GOWN DISPOSABLE) ×3 IMPLANT
GOWN STRL REUS W/TWL XL LVL3 (GOWN DISPOSABLE) ×3 IMPLANT
HLDR LEG FOAM (MISCELLANEOUS) ×1 IMPLANT
IV NS IRRIG 3000ML ARTHROMATIC (IV SOLUTION) ×8 IMPLANT
KIT BLADEGUARD II DBL (SET/KITS/TRAYS/PACK) ×3 IMPLANT
KIT TURNOVER CYSTO (KITS) ×3 IMPLANT
LEG HOLDER FOAM (MISCELLANEOUS) ×2
MANIFOLD NEPTUNE II (INSTRUMENTS) ×3 IMPLANT
MARKER SKIN DUAL TIP RULER LAB (MISCELLANEOUS) ×3 IMPLANT
NDL HYPO 18GX1.5 BLUNT FILL (NEEDLE) ×1 IMPLANT
NDL HYPO 21X1.5 SAFETY (NEEDLE) ×1 IMPLANT
NDL SPNL 18GX3.5 QUINCKE PK (NEEDLE) ×1 IMPLANT
NEEDLE HYPO 18GX1.5 BLUNT FILL (NEEDLE) ×3 IMPLANT
NEEDLE HYPO 21X1.5 SAFETY (NEEDLE) ×3 IMPLANT
NEEDLE SPNL 18GX3.5 QUINCKE PK (NEEDLE) ×3 IMPLANT
NS IRRIG 1000ML POUR BTL (IV SOLUTION) ×3 IMPLANT
PACK ARTHRO LIMB DRAPE STRL (MISCELLANEOUS) ×3 IMPLANT
PAD ABD 5X9 TENDERSORB (GAUZE/BANDAGES/DRESSINGS) ×3 IMPLANT
PAD ARMBOARD 7.5X6 YLW CONV (MISCELLANEOUS) ×3 IMPLANT
PADDING CAST COTTON 6X4 STRL (CAST SUPPLIES) ×3 IMPLANT
PADDING WEBRIL 6 STERILE (GAUZE/BANDAGES/DRESSINGS) ×2 IMPLANT
PROBE BIPOLAR 50 DEGREE SUCT (MISCELLANEOUS) IMPLANT
PROBE BIPOLAR ATHRO 135MM 90D (MISCELLANEOUS) ×2 IMPLANT
SET ARTHROSCOPY INST (INSTRUMENTS) ×3 IMPLANT
SET BASIN LINEN APH (SET/KITS/TRAYS/PACK) ×3 IMPLANT
SUT ETHILON 3 0 FSL (SUTURE) ×3 IMPLANT
SYR 30ML LL (SYRINGE) ×3 IMPLANT
TUBE CONNECTING 12'X1/4 (SUCTIONS) ×2
TUBE CONNECTING 12X1/4 (SUCTIONS) ×4 IMPLANT
TUBING ARTHRO INFLOW-ONLY STRL (TUBING) ×3 IMPLANT
WAND ARTHRO PARAGON T2 (SURGICAL WAND) IMPLANT

## 2017-08-31 NOTE — Interval H&P Note (Signed)
History and Physical Interval Note:  08/31/2017 11:21 AM  Nathaniel Hicks  has presented today for surgery, with the diagnosis of torn lateral meniscus  The various methods of treatment have been discussed with the patient and family. After consideration of risks, benefits and other options for treatment, the patient has consented to  Procedure(s): KNEE ARTHROSCOPY WITH LATERAL MENISECTOMY (Right) as a surgical intervention .  The patient's history has been reviewed, patient examined, no change in status, stable for surgery.  I have reviewed the patient's chart and labs.  Questions were answered to the patient's satisfaction.     Arther Abbott

## 2017-08-31 NOTE — Anesthesia Postprocedure Evaluation (Signed)
Anesthesia Post Note  Patient: Nathaniel Hicks  Procedure(s) Performed: KNEE ARTHROSCOPY WITH LATERAL MENISECTOMY (Right Knee)  Patient location during evaluation: PACU Anesthesia Type: General Level of consciousness: awake and alert and oriented Pain management: pain level controlled Vital Signs Assessment: post-procedure vital signs reviewed and stable Respiratory status: spontaneous breathing, nonlabored ventilation, respiratory function stable and patient connected to nasal cannula oxygen Cardiovascular status: stable Postop Assessment: no apparent nausea or vomiting Anesthetic complications: no     Last Vitals:  Vitals:   08/31/17 1050 08/31/17 1055  BP: (!) 128/59 128/64  Pulse:    Resp: 18 11  Temp:    SpO2: 94% 94%    Last Pain:  Vitals:   08/31/17 1044  TempSrc: Oral  PainSc: 5                  Jaran Sainz

## 2017-08-31 NOTE — Anesthesia Preprocedure Evaluation (Signed)
Anesthesia Evaluation  Patient identified by MRN, date of birth, ID band Patient awake    Reviewed: Allergy & Precautions, H&P , NPO status , Patient's Chart, lab work & pertinent test results, reviewed documented beta blocker date and time   Airway Mallampati: II  TM Distance: >3 FB Neck ROM: full    Dental no notable dental hx. (+) Teeth Intact, Dental Advidsory Given   Pulmonary neg pulmonary ROS, Current Smoker,    Pulmonary exam normal breath sounds clear to auscultation       Cardiovascular Exercise Tolerance: Good hypertension, On Medications negative cardio ROS   Rhythm:regular Rate:Normal     Neuro/Psych negative neurological ROS  negative psych ROS   GI/Hepatic negative GI ROS, Neg liver ROS,   Endo/Other  negative endocrine ROS  Renal/GU negative Renal ROS  negative genitourinary   Musculoskeletal   Abdominal   Peds  Hematology negative hematology ROS (+)   Anesthesia Other Findings 12 lead NSR 74 w/LVH Denies any other specific medical hx   Reproductive/Obstetrics negative OB ROS                             Anesthesia Physical Anesthesia Plan  ASA: II  Anesthesia Plan: General   Post-op Pain Management:    Induction:   PONV Risk Score and Plan:   Airway Management Planned:   Additional Equipment:   Intra-op Plan:   Post-operative Plan:   Informed Consent: I have reviewed the patients History and Physical, chart, labs and discussed the procedure including the risks, benefits and alternatives for the proposed anesthesia with the patient or authorized representative who has indicated his/her understanding and acceptance.   Dental Advisory Given  Plan Discussed with: CRNA and Anesthesiologist  Anesthesia Plan Comments:         Anesthesia Quick Evaluation

## 2017-08-31 NOTE — Transfer of Care (Signed)
Immediate Anesthesia Transfer of Care Note  Patient: Nathaniel Hicks  Procedure(s) Performed: KNEE ARTHROSCOPY WITH LATERAL MENISECTOMY (Right Knee)  Patient Location: PACU  Anesthesia Type:General  Level of Consciousness: awake, alert  and oriented  Airway & Oxygen Therapy: Patient Spontanous Breathing and Patient connected to nasal cannula oxygen  Post-op Assessment: Report given to RN and Post -op Vital signs reviewed and stable  Post vital signs: Reviewed and stable  Last Vitals:  Vitals Value Taken Time  BP 137/73 08/31/2017 12:36 PM  Temp    Pulse 93 08/31/2017 12:38 PM  Resp 19 08/31/2017 12:38 PM  SpO2 90 % 08/31/2017 12:38 PM  Vitals shown include unvalidated device data.  Last Pain:  Vitals:   08/31/17 1044  TempSrc: Oral  PainSc: 5       Patients Stated Pain Goal: 6 (40/37/09 6438)  Complications: No apparent anesthesia complications

## 2017-08-31 NOTE — Brief Op Note (Signed)
08/31/2017  12:24 PM  PATIENT:  Nathaniel Hicks  77 y.o. 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    Hypertrophic synovium throughout the knee with large amounts of synovitis   free edge fraying without frank tearing of the medial compartment with mild arthritis medial compartment   patellofemoral joint also had mild degenerative changes with a large trochlear lesion.  Knee arthroscopy dictation  The patient was identified in the preoperative holding area using 2 approved identification mechanisms. The chart was reviewed and updated. The surgical site was confirmed as right knee and marked with an indelible marker.  The patient was taken to the operating room for anesthesia. After successful general anesthesia, Ancef 2 g was used as IV antibiotics.  The patient was placed in the supine position with the (right) the operative extremity in an arthroscopic leg holder and the opposite extremity in a padded leg holder.  The timeout was executed.  A lateral portal was established with an 11 blade and the scope was introduced into the joint. A diagnostic arthroscopy was performed in circumferential manner examining the entire knee joint. A medial portal was established and the diagnostic arthroscopy was repeated using a probe to palpate intra-articular structures as they were encountered.   The operative findings are     The lateral meniscus was resected using a duckbill forceps. The meniscal fragments were removed with a motorized shaver. The meniscus was balanced with a combination of a motorized shaver and a  90 degree ConMed wand until a stable rim was obtained.  Synovectomy was performed using the 0.0 shaver as well as the 90 degree ConMed wand   The arthroscopic pump was placed on the wash mode and any excess debris was removed from the joint using suction.  60 cc of Marcaine with epinephrine was injected through the arthroscope.  The portals were closed with 3-0 nylon suture.  A sterile bandage, Ace wrap and Cryo/Cuff was placed and the Cryo/Cuff was activated. The patient was taken to the recovery room in stable condition.   SURGEON:  Surgeon(s) and Role:    * Carole Civil, MD - Primary  PHYSICIAN ASSISTANT:   ASSISTANTS: none   ANESTHESIA:   general  EBL:  10 mL   BLOOD ADMINISTERED:none  DRAINS: none   LOCAL MEDICATIONS USED:  MARCAINE     SPECIMEN:  No Specimen  DISPOSITION OF SPECIMEN:  N/A  COUNTS:  YES  TOURNIQUET:  * Missing tourniquet times found for documented tourniquets in log: [id number] *  DICTATION: .Dragon Dictation  PLAN OF CARE: Discharge to home after PACU  PATIENT DISPOSITION:  PACU - hemodynamically stable.   Delay start of Pharmacological VTE agent (>24hrs) due to surgical blood loss or risk of bleeding: not applicable

## 2017-08-31 NOTE — Discharge Instructions (Signed)
Follow up with Dr. Aline Brochure on August 28 for suture removal.     PATIENT INSTRUCTIONS POST-ANESTHESIA  IMMEDIATELY FOLLOWING SURGERY:  Do not drive or operate machinery for the first twenty four hours after surgery.  Do not make any important decisions for twenty four hours after surgery or while taking narcotic pain medications or sedatives.  If you develop intractable nausea and vomiting or a severe headache please notify your doctor immediately.  FOLLOW-UP:  Please make an appointment with your surgeon as instructed. You do not need to follow up with anesthesia unless specifically instructed to do so.  WOUND CARE INSTRUCTIONS (if applicable):  Keep a dry clean dressing on the anesthesia/puncture wound site if there is drainage.  Once the wound has quit draining you may leave it open to air.  Generally you should leave the bandage intact for twenty four hours unless there is drainage.  If the epidural site drains for more than 36-48 hours please call the anesthesia department.  QUESTIONS?:  Please feel free to call your physician or the hospital operator if you have any questions, and they will be happy to assist you.

## 2017-08-31 NOTE — Anesthesia Procedure Notes (Signed)
Procedure Name: LMA Insertion Date/Time: 08/31/2017 11:36 AM Performed by: Jonna Munro, CRNA Pre-anesthesia Checklist: Patient identified, Emergency Drugs available, Suction available, Patient being monitored and Timeout performed Patient Re-evaluated:Patient Re-evaluated prior to induction Oxygen Delivery Method: Circle system utilized Preoxygenation: Pre-oxygenation with 100% oxygen Induction Type: IV induction LMA: LMA inserted LMA Size: 4.0 Number of attempts: 1 Tube secured with: Tape Dental Injury: Teeth and Oropharynx as per pre-operative assessment

## 2017-08-31 NOTE — Op Note (Signed)
08/31/2017  12:24 PM  PATIENT:  Nathaniel Hicks  77 y.o. 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    Hypertrophic synovium throughout the knee with large amounts of synovitis   free edge fraying without frank tearing of the medial compartment with mild arthritis medial compartment   patellofemoral joint also had mild degenerative changes with a large trochlear lesion.  Knee arthroscopy dictation  The patient was identified in the preoperative holding area using 2 approved identification mechanisms. The chart was reviewed and updated. The surgical site was confirmed as right knee and marked with an indelible marker.  The patient was taken to the operating room for anesthesia. After successful general anesthesia, Ancef 2 g was used as IV antibiotics.  The patient was placed in the supine position with the (right) the operative extremity in an arthroscopic leg holder and the opposite extremity in a padded leg holder.  The timeout was executed.  A lateral portal was established with an 11 blade and the scope was introduced into the joint. A diagnostic arthroscopy was performed in circumferential manner examining the entire knee joint. A medial portal was established and the diagnostic arthroscopy was repeated using a probe to palpate intra-articular structures as they were encountered.   The operative findings are     The lateral meniscus was resected using a duckbill forceps. The meniscal fragments were removed with a motorized shaver. The meniscus was balanced with a combination of a motorized shaver and a  90 degree ConMed wand until a stable rim was obtained.  Synovectomy was performed using the 0.0 shaver as well as the 90 degree ConMed wand   The arthroscopic pump was placed on the wash mode and any excess debris was removed from the joint using suction.  60 cc of Marcaine with epinephrine was injected through the arthroscope.  The portals were closed with 3-0 nylon suture.  A sterile bandage, Ace wrap and Cryo/Cuff was placed and the Cryo/Cuff was activated. The patient was taken to the recovery room in stable condition.   SURGEON:  Surgeon(s) and Role:    * Carole Civil, MD - Primary  PHYSICIAN ASSISTANT:   ASSISTANTS: none   ANESTHESIA:   general  EBL:  10 mL   BLOOD ADMINISTERED:none  DRAINS: none   LOCAL MEDICATIONS USED:  MARCAINE     SPECIMEN:  No Specimen  DISPOSITION OF SPECIMEN:  N/A  COUNTS:  YES  TOURNIQUET:  * Missing tourniquet times found for documented tourniquets in log: [id number] *  DICTATION: .Dragon Dictation  PLAN OF CARE: Discharge to home after PACU  PATIENT DISPOSITION:  PACU - hemodynamically stable.   Delay start of Pharmacological VTE agent (>24hrs) due to surgical blood loss or risk of bleeding: not applicable

## 2017-09-01 ENCOUNTER — Encounter (HOSPITAL_COMMUNITY): Payer: Self-pay | Admitting: Orthopedic Surgery

## 2017-09-07 DIAGNOSIS — Z9889 Other specified postprocedural states: Secondary | ICD-10-CM | POA: Insufficient documentation

## 2017-09-08 ENCOUNTER — Ambulatory Visit (INDEPENDENT_AMBULATORY_CARE_PROVIDER_SITE_OTHER): Payer: Medicare Other | Admitting: Orthopedic Surgery

## 2017-09-08 DIAGNOSIS — Z9889 Other specified postprocedural states: Secondary | ICD-10-CM

## 2017-09-08 NOTE — Patient Instructions (Signed)
Continue knee exercises 3 times a day and ice twice a day

## 2017-09-08 NOTE — Progress Notes (Signed)
POSTOP VISIT  POD #8  Chief Complaint  Patient presents with  . Follow-up    Recheck on right knee, DOS 08-31-17.    HPI 61 first postop visit knee scope doing well minimal pain  Encounter Diagnosis  Name Primary?  . S/P right knee arthroscopy 08/31/17     Portals are clean mild swelling walks with a cane  Postoperative plan (Work, WB, No orders of the defined types were placed in this encounter. ,FU)  Continue exercises ice follow-up 3 weeks

## 2017-09-29 ENCOUNTER — Encounter: Payer: Self-pay | Admitting: Orthopedic Surgery

## 2017-09-29 ENCOUNTER — Ambulatory Visit (INDEPENDENT_AMBULATORY_CARE_PROVIDER_SITE_OTHER): Payer: Medicare Other | Admitting: Orthopedic Surgery

## 2017-09-29 VITALS — BP 134/67 | HR 67 | Ht 71.0 in | Wt 191.0 lb

## 2017-09-29 DIAGNOSIS — Z9889 Other specified postprocedural states: Secondary | ICD-10-CM

## 2017-09-29 DIAGNOSIS — M23300 Other meniscus derangements, unspecified lateral meniscus, right knee: Secondary | ICD-10-CM

## 2017-09-29 NOTE — Progress Notes (Signed)
Chief Complaint  Patient presents with  . Knee Pain    DOS 08/31/17 R knee    77 years old right knee arthroscopy torn lateral meniscus grade 4 arthritis doing well ambulating without assistive device  Recommend he resume normal activities including riding his bike  Follow-up as needed  08/31/2017   12:24 PM   PATIENT:  Nathaniel Hicks  77 y.o. 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     Hypertrophic synovium throughout the knee with large amounts of synovitis    free edge fraying without frank tearing of the medial compartment with mild arthritis medial compartment    patellofemoral joint also had mild degenerative changes with a large trochlear lesion.

## 2017-10-26 ENCOUNTER — Inpatient Hospital Stay (HOSPITAL_COMMUNITY): Payer: Medicare Other | Attending: Hematology

## 2017-10-26 DIAGNOSIS — F17210 Nicotine dependence, cigarettes, uncomplicated: Secondary | ICD-10-CM | POA: Diagnosis not present

## 2017-10-26 DIAGNOSIS — E559 Vitamin D deficiency, unspecified: Secondary | ICD-10-CM | POA: Insufficient documentation

## 2017-10-26 DIAGNOSIS — I10 Essential (primary) hypertension: Secondary | ICD-10-CM | POA: Insufficient documentation

## 2017-10-26 DIAGNOSIS — M1990 Unspecified osteoarthritis, unspecified site: Secondary | ICD-10-CM | POA: Diagnosis not present

## 2017-10-26 DIAGNOSIS — Z79899 Other long term (current) drug therapy: Secondary | ICD-10-CM | POA: Diagnosis not present

## 2017-10-26 DIAGNOSIS — N400 Enlarged prostate without lower urinary tract symptoms: Secondary | ICD-10-CM | POA: Insufficient documentation

## 2017-10-26 DIAGNOSIS — C6211 Malignant neoplasm of descended right testis: Secondary | ICD-10-CM | POA: Insufficient documentation

## 2017-10-26 DIAGNOSIS — E785 Hyperlipidemia, unspecified: Secondary | ICD-10-CM | POA: Diagnosis not present

## 2017-10-26 LAB — CBC WITH DIFFERENTIAL/PLATELET
Abs Immature Granulocytes: 0.02 10*3/uL (ref 0.00–0.07)
BASOS ABS: 0.1 10*3/uL (ref 0.0–0.1)
Basophils Relative: 1 %
EOS PCT: 3 %
Eosinophils Absolute: 0.2 10*3/uL (ref 0.0–0.5)
HEMATOCRIT: 41 % (ref 39.0–52.0)
HEMOGLOBIN: 12.6 g/dL — AB (ref 13.0–17.0)
IMMATURE GRANULOCYTES: 0 %
LYMPHS ABS: 2.4 10*3/uL (ref 0.7–4.0)
LYMPHS PCT: 33 %
MCH: 32.1 pg (ref 26.0–34.0)
MCHC: 30.7 g/dL (ref 30.0–36.0)
MCV: 104.3 fL — ABNORMAL HIGH (ref 80.0–100.0)
Monocytes Absolute: 0.7 10*3/uL (ref 0.1–1.0)
Monocytes Relative: 10 %
NRBC: 0 % (ref 0.0–0.2)
Neutro Abs: 3.8 10*3/uL (ref 1.7–7.7)
Neutrophils Relative %: 53 %
Platelets: 199 10*3/uL (ref 150–400)
RBC: 3.93 MIL/uL — AB (ref 4.22–5.81)
RDW: 14.2 % (ref 11.5–15.5)
WBC: 7.2 10*3/uL (ref 4.0–10.5)

## 2017-10-26 LAB — COMPREHENSIVE METABOLIC PANEL
ALBUMIN: 4.4 g/dL (ref 3.5–5.0)
ALK PHOS: 59 U/L (ref 38–126)
ALT: 16 U/L (ref 0–44)
ANION GAP: 8 (ref 5–15)
AST: 24 U/L (ref 15–41)
BILIRUBIN TOTAL: 0.9 mg/dL (ref 0.3–1.2)
BUN: 14 mg/dL (ref 8–23)
CALCIUM: 10 mg/dL (ref 8.9–10.3)
CO2: 24 mmol/L (ref 22–32)
Chloride: 107 mmol/L (ref 98–111)
Creatinine, Ser: 0.94 mg/dL (ref 0.61–1.24)
GFR calc Af Amer: >60 mL/min (ref >60)
GFR calc non Af Amer: >60 mL/min (ref >60)
GLUCOSE: 100 mg/dL — AB (ref 70–99)
POTASSIUM: 4.4 mmol/L (ref 3.5–5.1)
SODIUM: 139 mmol/L (ref 135–145)
Total Protein: 7.8 g/dL (ref 6.5–8.1)

## 2017-10-27 LAB — AFP TUMOR MARKER: AFP, Serum, Tumor Marker: 1.7 ng/mL (ref 0.0–8.3)

## 2017-10-27 LAB — BETA HCG QUANT (REF LAB)

## 2017-11-01 NOTE — Assessment & Plan Note (Signed)
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  He opted for close surveillance rather than adjuvant chemotherapy.  No oncology role for labs today.   His last Abd/pelvis imaging was in 2018.  CT abd/pelvis w contrast in 12 months.  Return in 12 months for follow-up.  NCCN guidelines surveillance for Stage I seminomatous testicular cancer after orchiectomy without adjuvant chemotherapy recommends the following surveillance (1.2020):  A. Year 1: H+P every 3-6 months, abdominal/pelvic CT at 3, 6, 12 months, Chest X-ray as clinically indicated (consider CT of chest with contrast in symptomatic patients).   2. Year 2: H+P every 6 months, abdominal/pelvic CT every 6 months, chest x-ray as clinically indicated (consider CT of chest with contrast in symptomatic patients).   3. Year 3:H+P every 6-12 months, abdominal/pelvic CT every 6-12 months, chest x-ray as clinically indicated (consider CT of chest with contrast in symptomatic patients).   4. Year 4: H+P every 12 months, abdominal/pelvis CT every 12-24 months, and chest x-ray as clinically indicated (consider CT of chest with contrast in symptomatic patients).  5. Year 5:  H+P every 12 months, abdominal/pelvis CT every 12-24 months, and chest x-ray as clinically indicated (consider CT of chest with contrast in symptomatic patients).

## 2017-11-02 ENCOUNTER — Other Ambulatory Visit: Payer: Self-pay

## 2017-11-02 ENCOUNTER — Inpatient Hospital Stay (HOSPITAL_BASED_OUTPATIENT_CLINIC_OR_DEPARTMENT_OTHER): Payer: Medicare Other | Admitting: Oncology

## 2017-11-02 ENCOUNTER — Encounter (HOSPITAL_COMMUNITY): Payer: Self-pay | Admitting: Oncology

## 2017-11-02 ENCOUNTER — Ambulatory Visit (HOSPITAL_COMMUNITY): Payer: Medicare Other

## 2017-11-02 VITALS — BP 129/53 | HR 62 | Temp 97.9°F | Resp 18 | Wt 190.0 lb

## 2017-11-02 DIAGNOSIS — M1990 Unspecified osteoarthritis, unspecified site: Secondary | ICD-10-CM

## 2017-11-02 DIAGNOSIS — Z79899 Other long term (current) drug therapy: Secondary | ICD-10-CM

## 2017-11-02 DIAGNOSIS — I10 Essential (primary) hypertension: Secondary | ICD-10-CM | POA: Diagnosis not present

## 2017-11-02 DIAGNOSIS — F17210 Nicotine dependence, cigarettes, uncomplicated: Secondary | ICD-10-CM

## 2017-11-02 DIAGNOSIS — C6211 Malignant neoplasm of descended right testis: Secondary | ICD-10-CM | POA: Diagnosis not present

## 2017-11-02 DIAGNOSIS — N400 Enlarged prostate without lower urinary tract symptoms: Secondary | ICD-10-CM

## 2017-11-02 DIAGNOSIS — E559 Vitamin D deficiency, unspecified: Secondary | ICD-10-CM | POA: Diagnosis not present

## 2017-11-02 DIAGNOSIS — E785 Hyperlipidemia, unspecified: Secondary | ICD-10-CM | POA: Diagnosis not present

## 2017-11-02 HISTORY — DX: Essential (primary) hypertension: I10

## 2017-11-02 HISTORY — DX: Vitamin D deficiency, unspecified: E55.9

## 2017-11-02 HISTORY — DX: Hyperlipidemia, unspecified: E78.5

## 2017-11-02 NOTE — Progress Notes (Signed)
t       Nathaniel Gaskins, MD [address] 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  He opted for close surveillance rather than adjuvant chemotherapy.  CURRENT THERAPY: Surveillance per NCCN guideline  CURRENT STATUS: Nathaniel Hicks 77 y.o. male returns for followup of pure seminoma of right testicle, doing very well.  He denies any new oncology related complaints.  He denies any new pain.  He denies any cough or hemoptysis.  He denies any abdominal discomfort, bloating, or early satiety.  His appetite is good and his weight is stable.  Review of Systems  Constitutional: Negative.  Negative for chills, fever and weight loss.  HENT: Negative.   Eyes: Negative.   Respiratory: Negative.  Negative for cough.   Cardiovascular: Negative.  Negative for chest pain.  Gastrointestinal: Negative.  Negative for blood in stool, constipation, diarrhea, melena, nausea and vomiting.  Genitourinary: Negative.   Musculoskeletal: Negative.   Skin: Negative.   Neurological: Negative.  Negative for weakness.  Endo/Heme/Allergies: Negative.   Psychiatric/Behavioral: Negative.     Past Medical History:  Diagnosis Date  . Arthritis   . BPH (benign prostatic hyperplasia)   . HOH (hard of hearing)   . Hyperlipidemia 11/02/2017  . Hypertension   . Hypertension 11/02/2017  . 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[facility])    2014  . Vitamin D deficiency 11/02/2017    Past Surgical History:  Procedure Laterality Date  . COLONOSCOPY N/A 11/24/2012   Procedure: COLONOSCOPY;  Surgeon: Rogene Houston, MD;  Location: [facility];  Service: Endoscopy;  Laterality: N/A;  830-moved to [place] notified pt  . HEMORROIDECTOMY    . KNEE ARTHROSCOPY WITH LATERAL MENISECTOMY Right 08/31/2017   Procedure: KNEE ARTHROSCOPY WITH LATERAL  MENISECTOMY;  Surgeon: Carole Civil, MD;  Location: [facility] ORS;  Service: Orthopedics;  Laterality: Right;  . LESION EXCISION N/A 03/23/2012   Procedure: EXCISION NEOPLASM SCALP ;  Surgeon: Jamesetta So, MD;  Location: [facility] ORS;  Service: General;  Laterality: N/A;  Excision of Scalp Neoplasm  . ORCHIECTOMY Right 12/20/2012   Procedure: RIGHT RADICAL ORCHIECTOMY/POSSIBLE BX RIGHT TESTICLE;  Surgeon: Marissa Nestle, MD;  Location: [facility] ORS;  Service: Urology;  Laterality: Right;  . PROSTATE SURGERY    . SCALP LACERATION REPAIR     [facility]-Dr Tamala Julian    Family History  Problem Relation Age of Onset  . Cancer Brother   . Cancer Brother     Social History   Socioeconomic History  . Marital status: Single    Spouse name: Not on file  . Number of children: Not on file  . Years of education: Not on file  . Highest education level: Not on file  Occupational History  . Not on file  Social Needs  . Financial resource strain: Not on file  . Food insecurity:    Worry: Not on file    Inability: Not on file  . Transportation needs:    Medical: Not on file    Non-medical: Not on file  Tobacco Use  . Smoking status: Current Every Day Smoker    Packs/day: 0.25    Years: 50.00    Pack years: 12.50    Types: Cigarettes  . Smokeless tobacco: Never Used  Substance and Sexual Activity  . Alcohol use: Not Currently  . Drug use:  No  . Sexual activity: Yes    Birth control/protection: None  Lifestyle  . Physical activity:    Days per week: Not on file    Minutes per session: Not on file  . Stress: Not on file  Relationships  . Social connections:    Talks on phone: Not on file    Gets together: Not on file    Attends religious service: Not on file    Active member of club or organization: Not on file    Attends meetings of clubs or organizations: Not on file    Relationship status: Not on file  Other Topics Concern  . Not on file  Social History Narrative  . Not on file      PHYSICAL EXAMINATION  ECOG PERFORMANCE STATUS: 1 - Symptomatic but completely ambulatory  Vitals:   11/02/17 1105  BP: (!) 129/53  Pulse: 62  Resp: 18  Temp: 97.9 F (36.6 C)  SpO2: 98%    GENERAL:alert, no distress, well nourished, well developed, comfortable, cooperative, obese and smiling SKIN: skin color, texture, turgor are normal, no rashes or significant lesions HEAD: Normocephalic, No masses, lesions, tenderness or abnormalities EYES: normal, EOMI, Conjunctiva are pink and non-injected EARS: External ears normal OROPHARYNX:lips, buccal mucosa, and tongue normal and mucous membranes are moist  NECK: supple, no adenopathy, trachea midline LYMPH:  no palpable lymphadenopathy BREAST:not examined LUNGS: clear to auscultation  HEART: regular rate & rhythm ABDOMEN:abdomen soft, non-tender, obese and normal bowel sounds BACK: Back symmetric, no curvature. EXTREMITIES:less then 2 second capillary refill, no joint deformities, effusion, or inflammation, no skin discoloration, no cyanosis  NEURO: alert & oriented x 3 with fluent speech, no focal motor/sensory deficits, gait normal TESTICULAR: Absence of right testicle.  Left testicle descended.  No palpable nodules or masses.   LABORATORY DATA: CBC    Component Value Date/Time   WBC 7.2 10/26/2017 1050   RBC 3.93 (L) 10/26/2017 1050   HGB 12.6 (L) 10/26/2017 1050   HCT 41.0 10/26/2017 1050   PLT 199 10/26/2017 1050   MCV 104.3 (H) 10/26/2017 1050   MCH 32.1 10/26/2017 1050   MCHC 30.7 10/26/2017 1050   RDW 14.2 10/26/2017 1050   LYMPHSABS 2.4 10/26/2017 1050   MONOABS 0.7 10/26/2017 1050   EOSABS 0.2 10/26/2017 1050   BASOSABS 0.1 10/26/2017 1050      Chemistry      Component Value Date/Time   NA 139 10/26/2017 1050   K 4.4 10/26/2017 1050   CL 107 10/26/2017 1050   CO2 24 10/26/2017 1050   BUN 14 10/26/2017 1050   CREATININE 0.94 10/26/2017 1050      Component Value Date/Time   CALCIUM 10.0 10/26/2017  1050   ALKPHOS 59 10/26/2017 1050   AST 24 10/26/2017 1050   ALT 16 10/26/2017 1050   BILITOT 0.9 10/26/2017 1050       RADIOGRAPHIC STUDIES:  No results found.   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  He opted for close surveillance rather than adjuvant chemotherapy.  No oncology role for labs today.   His last Abd/pelvis imaging was in 2018.  CT abd/pelvis w contrast in 12 months.  Return in 12 months for follow-up.  NCCN guidelines surveillance for Stage I seminomatous testicular cancer after orchiectomy without adjuvant chemotherapy recommends the following surveillance (1.2020):  A. Year 1: H+P every 3-6 months, abdominal/pelvic CT at 3, 6, 12  months, Chest X-ray as clinically indicated (consider CT of chest with contrast in symptomatic patients).   2. Year 2: H+P every 6 months, abdominal/pelvic CT every 6 months, chest x-ray as clinically indicated (consider CT of chest with contrast in symptomatic patients).   3. Year 3:H+P every 6-12 months, abdominal/pelvic CT every 6-12 months, chest x-ray as clinically indicated (consider CT of chest with contrast in symptomatic patients).   4. Year 4: H+P every 12 months, abdominal/pelvis CT every 12-24 months, and chest x-ray as clinically indicated (consider CT of chest with contrast in symptomatic patients).  5. Year 5:  H+P every 12 months, abdominal/pelvis CT every 12-24 months, and chest x-ray as clinically indicated (consider CT of chest with contrast in symptomatic patients).   2. Essential hypertension Blood pressure today is 129/53.  Well-controlled.  3. Vitamin D deficiency On vitamin D replacement therapy.   ORDERS PLACED FOR THIS ENCOUNTER: No orders of the defined types were placed in this encounter.   MEDICATIONS PRESCRIBED THIS ENCOUNTER: No orders of the defined types were placed in this encounter.   THERAPY PLAN:   Surveillance per NCCN guidelines.  All questions were answered. The patient knows to call the clinic with any problems, questions or concerns. We can certainly see the patient much sooner if necessary.  Patient and plan discussed with Dr. [name] and she is in agreement with the aforementioned.   This note is electronically signed by: Robynn Pane, PA-C 11/02/2017 11:26 AM

## 2017-11-02 NOTE — Patient Instructions (Signed)
[place] at [facility] Discharge Instructions  CT abdomen and pelvis in 12 months. Return in 12 months for follow-up.   Thank you for choosing [place] at [facility] to provide your oncology and hematology care.  To afford each patient quality time with our provider, please arrive at least 15 minutes before your scheduled appointment time.   If you have a lab appointment with the [facility] please come in thru the  Main Entrance and check in at the main information desk  You need to re-schedule your appointment should you arrive 10 or more minutes late.  We strive to give you quality time with our providers, and arriving late affects you and other patients whose appointments are after yours.  Also, if you no show three or more times for appointments you may be dismissed from the clinic at the providers discretion.     Again, thank you for choosing [facility].  Our hope is that these requests will decrease the amount of time that you wait before being seen by our physicians.       _____________________________________________________________  Should you have questions after your visit to [facility], please contact our office at [phone] between the hours of 8:00 a.m. and 4:30 p.m.  Voicemails left after 4:00 p.m. will not be returned until the following business day.  For prescription refill requests, have your pharmacy contact our office and allow 72 hours.    Cancer Center Support Programs:   > Cancer Support Group  2nd Tuesday of the month 1pm-2pm, Journey Room

## 2018-10-25 ENCOUNTER — Other Ambulatory Visit (HOSPITAL_COMMUNITY): Payer: Self-pay

## 2018-10-25 DIAGNOSIS — E559 Vitamin D deficiency, unspecified: Secondary | ICD-10-CM

## 2018-10-25 DIAGNOSIS — C6211 Malignant neoplasm of descended right testis: Secondary | ICD-10-CM

## 2018-10-26 ENCOUNTER — Inpatient Hospital Stay (HOSPITAL_COMMUNITY): Payer: Medicare Other | Attending: Hematology

## 2018-10-26 ENCOUNTER — Other Ambulatory Visit: Payer: Self-pay

## 2018-10-26 DIAGNOSIS — E559 Vitamin D deficiency, unspecified: Secondary | ICD-10-CM | POA: Diagnosis not present

## 2018-10-26 DIAGNOSIS — F17210 Nicotine dependence, cigarettes, uncomplicated: Secondary | ICD-10-CM | POA: Insufficient documentation

## 2018-10-26 DIAGNOSIS — M1990 Unspecified osteoarthritis, unspecified site: Secondary | ICD-10-CM | POA: Insufficient documentation

## 2018-10-26 DIAGNOSIS — E785 Hyperlipidemia, unspecified: Secondary | ICD-10-CM | POA: Diagnosis not present

## 2018-10-26 DIAGNOSIS — Z9079 Acquired absence of other genital organ(s): Secondary | ICD-10-CM | POA: Insufficient documentation

## 2018-10-26 DIAGNOSIS — I10 Essential (primary) hypertension: Secondary | ICD-10-CM | POA: Diagnosis not present

## 2018-10-26 DIAGNOSIS — N400 Enlarged prostate without lower urinary tract symptoms: Secondary | ICD-10-CM | POA: Diagnosis not present

## 2018-10-26 DIAGNOSIS — C6211 Malignant neoplasm of descended right testis: Secondary | ICD-10-CM

## 2018-10-26 DIAGNOSIS — Z79899 Other long term (current) drug therapy: Secondary | ICD-10-CM | POA: Insufficient documentation

## 2018-10-26 LAB — CBC WITH DIFFERENTIAL/PLATELET
Abs Immature Granulocytes: 0.01 10*3/uL (ref 0.00–0.07)
Basophils Absolute: 0.1 10*3/uL (ref 0.0–0.1)
Basophils Relative: 1 %
Eosinophils Absolute: 0.3 10*3/uL (ref 0.0–0.5)
Eosinophils Relative: 4 %
HCT: 43.1 % (ref 39.0–52.0)
Hemoglobin: 13.7 g/dL (ref 13.0–17.0)
Immature Granulocytes: 0 %
Lymphocytes Relative: 36 %
Lymphs Abs: 2 10*3/uL (ref 0.7–4.0)
MCH: 33.3 pg (ref 26.0–34.0)
MCHC: 31.8 g/dL (ref 30.0–36.0)
MCV: 104.9 fL — ABNORMAL HIGH (ref 80.0–100.0)
Monocytes Absolute: 0.7 10*3/uL (ref 0.1–1.0)
Monocytes Relative: 12 %
Neutro Abs: 2.6 10*3/uL (ref 1.7–7.7)
Neutrophils Relative %: 47 %
Platelets: 200 10*3/uL (ref 150–400)
RBC: 4.11 MIL/uL — ABNORMAL LOW (ref 4.22–5.81)
RDW: 13.3 % (ref 11.5–15.5)
WBC: 5.6 10*3/uL (ref 4.0–10.5)
nRBC: 0 % (ref 0.0–0.2)

## 2018-10-26 LAB — COMPREHENSIVE METABOLIC PANEL
ALT: 14 U/L (ref 0–44)
AST: 19 U/L (ref 15–41)
Albumin: 4.4 g/dL (ref 3.5–5.0)
Alkaline Phosphatase: 49 U/L (ref 38–126)
Anion gap: 8 (ref 5–15)
BUN: 15 mg/dL (ref 8–23)
CO2: 27 mmol/L (ref 22–32)
Calcium: 10.2 mg/dL (ref 8.9–10.3)
Chloride: 107 mmol/L (ref 98–111)
Creatinine, Ser: 0.96 mg/dL (ref 0.61–1.24)
GFR calc Af Amer: >60 mL/min (ref >60)
GFR calc non Af Amer: >60 mL/min (ref >60)
Glucose, Bld: 105 mg/dL — ABNORMAL HIGH (ref 70–99)
Potassium: 4.2 mmol/L (ref 3.5–5.1)
Sodium: 142 mmol/L (ref 135–145)
Total Bilirubin: 0.7 mg/dL (ref 0.3–1.2)
Total Protein: 7.7 g/dL (ref 6.5–8.1)

## 2018-10-26 LAB — LACTATE DEHYDROGENASE: LDH: 175 U/L (ref 98–192)

## 2018-11-03 ENCOUNTER — Ambulatory Visit (HOSPITAL_COMMUNITY)
Admission: RE | Admit: 2018-11-03 | Discharge: 2018-11-03 | Disposition: A | Discharge status: Home / Self Care | Payer: Medicare Other | Source: Ambulatory Visit | Attending: Hematology | Admitting: Hematology

## 2018-11-03 ENCOUNTER — Other Ambulatory Visit: Payer: Self-pay

## 2018-11-03 DIAGNOSIS — C6211 Malignant neoplasm of descended right testis: Secondary | ICD-10-CM | POA: Insufficient documentation

## 2018-11-03 MED ORDER — IOHEXOL 300 MG/ML  SOLN
100.0000 mL | Freq: Once | INTRAMUSCULAR | Status: AC | PRN
  Administered 2018-11-03: 12:00:00 100 mL via INTRAVENOUS

## 2018-11-08 ENCOUNTER — Encounter (HOSPITAL_COMMUNITY): Payer: Self-pay | Admitting: Hematology

## 2018-11-08 ENCOUNTER — Inpatient Hospital Stay (HOSPITAL_BASED_OUTPATIENT_CLINIC_OR_DEPARTMENT_OTHER): Payer: Medicare Other | Admitting: Hematology

## 2018-11-08 ENCOUNTER — Other Ambulatory Visit: Payer: Self-pay

## 2018-11-08 VITALS — BP 141/60 | HR 57 | Temp 97.5°F | Resp 16 | Wt 193.3 lb

## 2018-11-08 DIAGNOSIS — C6211 Malignant neoplasm of descended right testis: Secondary | ICD-10-CM

## 2018-11-08 NOTE — Patient Instructions (Signed)
[place] at [facility] Discharge Instructions  You were seen today by Dr. Delton Coombes. He went over your recent lab and scan results. He will see you back in 1 year for labs and follow up.   Thank you for choosing [place] at [facility] to provide your oncology and hematology care.  To afford each patient quality time with our provider, please arrive at least 15 minutes before your scheduled appointment time.   If you have a lab appointment with the [facility] please come in thru the  Main Entrance and check in at the main information desk  You need to re-schedule your appointment should you arrive 10 or more minutes late.  We strive to give you quality time with our providers, and arriving late affects you and other patients whose appointments are after yours.  Also, if you no show three or more times for appointments you may be dismissed from the clinic at the providers discretion.     Again, thank you for choosing [facility].  Our hope is that these requests will decrease the amount of time that you wait before being seen by our physicians.       _____________________________________________________________  Should you have questions after your visit to [facility], please contact our office at [phone] between the hours of 8:00 a.m. and 4:30 p.m.  Voicemails left after 4:00 p.m. will not be returned until the following business day.  For prescription refill requests, have your pharmacy contact our office and allow 72 hours.    Cancer Center Support Programs:   > Cancer Support Group  2nd Tuesday of the month 1pm-2pm, Journey Room

## 2018-11-08 NOTE — Progress Notes (Signed)
[place], [place] [address]   CLINIC:  Medical Oncology/Hematology  PCP:  [name], [place] [address] [phone]   REASON FOR VISIT:  Follow-up for testicular seminoma.  CURRENT THERAPY: Observation.  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 Testis, biopsy, right - SEMINOMA. PLEASE SEE COMMENT. 2. Testis, tumor, right - SEMINOMA, 7.5 CM. - ANGIOLYMPHATIC INVASION PRESENT. - RESECTION MARGINS, NEGATIVE FOR ATYPIA OR MALIGNANCY.   01/18/2013 PET scan   Status post right orchiectomy. No findings specific for metastatic disease. Small para-aortic nodes measuring up to 5 mm short axis, without convincing hypermetabolism. Given location, attention on follow-up is suggested.   04/17/2013 Imaging   CT CAP- No evidence of metastatic disease in the chest, abdomen or pelvis. Proximal LAD coronary artery calcification.   07/20/2013 Imaging   CT abd/pelvis- No evidence of metastatic disease in the abdomen or pelvis.   10/24/2013 Imaging   CT abd/pelvis- No findings to suggest metastatic disease in the abdomen or pelvis   10/24/2013 Imaging   Chest xray- Probable COPD. Negative for metastatic disease.   02/21/2014 Imaging   CT abd/pelvis- Stable abdominal pelvic CT status post right orchectomy. No evidence of adenopathy or other metastatic disease.   02/21/2014 Imaging   Chest xray- Left lower lobe mild atelectasis and/or infiltrate.    09/10/2014 Imaging   CT abd/pelvis- Status post right orchiectomy.  No evidence of metastatic disease.  3 mm nonobstructing right upper pole renal calculus. No hydronephrosis.   03/14/2015 Imaging   CT abd/pelvis- Stable exam. No evidence of metastatic disease or other acute findings within the abdomen or pelvis.   09/13/2015 Imaging   CT abd/pelvis- No acute findings and no evidence for mass or  adenopathy.    04/24/2016 Imaging   CT abd/pelvis: IMPRESSION: 1. Stable exam. No new or progressive findings. No features to suggest metastatic disease.      CANCER STAGING: Cancer Staging Testicle cancer, right Staging form: Testis, AJCC 7th Edition - Clinical: Stage I (T2, N0, M0) - Signed by Baird Cancer, PA-C on 07/23/2013    INTERVAL HISTORY:  Nathaniel Hicks 78 y.o. male seen for follow-up of testicular tumor.  He had an orchiectomy done in December 2014.  Denies any new onset pains.  Denies any fevers, night sweats or weight loss.  Appetite and energy levels are 100%.  Denies any headaches or vision changes.  No shortness of breath or chest pains reported.    REVIEW OF SYSTEMS:  Review of Systems  All other systems reviewed and are negative.    PAST MEDICAL/SURGICAL HISTORY:  Past Medical History:  Diagnosis Date  . Arthritis   . BPH (benign prostatic hyperplasia)   . HOH (hard of hearing)   . Hyperlipidemia 11/02/2017  . Hypertension   . Hypertension 11/02/2017  . 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[place])    2014  . Vitamin D deficiency 11/02/2017   Past Surgical History:  Procedure Laterality Date  . COLONOSCOPY N/A 11/24/2012   Procedure: COLONOSCOPY;  Surgeon: Rogene Houston, MD;  Location: [facility];  Service: Endoscopy;  Laterality: N/A;  830-moved to [place] notified pt  . HEMORROIDECTOMY    . KNEE ARTHROSCOPY WITH LATERAL MENISECTOMY Right 08/31/2017   Procedure: KNEE ARTHROSCOPY WITH LATERAL MENISECTOMY;  Surgeon: Carole Civil, MD;  Location: [facility] ORS;  Service: Orthopedics;  Laterality: Right;  . LESION EXCISION  N/A 03/23/2012   Procedure: EXCISION NEOPLASM SCALP ;  Surgeon: Jamesetta So, MD;  Location: [facility] ORS;  Service: General;  Laterality: N/A;  Excision of Scalp Neoplasm  . ORCHIECTOMY Right 12/20/2012   Procedure: RIGHT RADICAL ORCHIECTOMY/POSSIBLE BX RIGHT TESTICLE;  Surgeon: Marissa Nestle, MD;  Location: [facility] ORS;  Service: Urology;  Laterality:  Right;  . PROSTATE SURGERY    . SCALP LACERATION REPAIR     [facility]-Dr Tamala Julian     SOCIAL HISTORY:  Social History   Socioeconomic History  . Marital status: Single    Spouse name: Not on file  . Number of children: Not on file  . Years of education: Not on file  . Highest education level: Not on file  Occupational History  . Not on file  Social Needs  . Financial resource strain: Not on file  . Food insecurity    Worry: Not on file    Inability: Not on file  . Transportation needs    Medical: Not on file    Non-medical: Not on file  Tobacco Use  . Smoking status: Current Every Day Smoker    Packs/day: 0.25    Years: 50.00    Pack years: 12.50    Types: Cigarettes  . Smokeless tobacco: Never Used  Substance and Sexual Activity  . Alcohol use: Not Currently  . Drug use: No  . Sexual activity: Yes    Birth control/protection: None  Lifestyle  . Physical activity    Days per week: Not on file    Minutes per session: Not on file  . Stress: Not on file  Relationships  . Social Herbalist on phone: Not on file    Gets together: Not on file    Attends religious service: Not on file    Active member of club or organization: Not on file    Attends meetings of clubs or organizations: Not on file    Relationship status: Not on file  . Intimate partner violence    Fear of current or ex partner: Not on file    Emotionally abused: Not on file    Physically abused: Not on file    Forced sexual activity: Not on file  Other Topics Concern  . Not on file  Social History Narrative  . Not on file    FAMILY HISTORY:  Family History  Problem Relation Age of Onset  . Cancer Brother   . Cancer Brother     CURRENT MEDICATIONS:  Outpatient Encounter Medications as of 11/08/2018  Medication Sig  . amLODipine-benazepril (LOTREL) 10-20 MG per capsule 1 capsule daily.   . Cholecalciferol (VITAMIN D3) 50000 units TABS Take 5,000 Units by mouth daily.  Marland Kitchen gabapentin  (NEURONTIN) 300 MG capsule Take 300 mg by mouth 2 (two) times daily.  . magnesium oxide (MAG-OX) 400 MG tablet Take 400 mg by mouth daily.  . Multiple Vitamins-Minerals (MULTIVITAMIN WITH MINERALS) tablet Take 1 tablet by mouth daily.  . naproxen (NAPROSYN) 500 MG tablet Take 500 mg by mouth 2 (two) times daily.   Marland Kitchen oxyCODONE (OXY IR/ROXICODONE) 5 MG immediate release tablet Take 5 mg by mouth 3 (three) times daily as needed for pain.  . pravastatin (PRAVACHOL) 40 MG tablet Take 40 mg by mouth daily.  Marland Kitchen RAPAFLO 8 MG CAPS capsule Take 8 mg by mouth daily.    No facility-administered encounter medications on file as of 11/08/2018.     ALLERGIES:  No Known Allergies  PHYSICAL EXAM:  ECOG Performance status: 1  Vitals:   11/08/18 1200  BP: (!) 141/60  Pulse: (!) 57  Resp: 16  Temp: (!) 97.5 F (36.4 C)  SpO2: 96%   Filed Weights   11/08/18 1200  Weight: 193 lb 5 oz (87.7 kg)    Physical Exam Vitals signs reviewed.  Constitutional:      Appearance: Normal appearance.  Cardiovascular:     Rate and Rhythm: Normal rate and regular rhythm.     Heart sounds: Normal heart sounds.  Pulmonary:     Effort: Pulmonary effort is normal.     Breath sounds: Normal breath sounds.  Abdominal:     General: There is no distension.     Palpations: Abdomen is soft. There is no mass.  Musculoskeletal:        General: No swelling.  Lymphadenopathy:     Cervical: No cervical adenopathy.  Skin:    General: Skin is warm.  Neurological:     General: No focal deficit present.     Mental Status: He is alert and oriented to person, place, and time.  Psychiatric:        Mood and Affect: Mood normal.        Behavior: Behavior normal.      LABORATORY DATA:  I have reviewed the labs as listed.  CBC    Component Value Date/Time   WBC 5.6 10/26/2018 1137   RBC 4.11 (L) 10/26/2018 1137   HGB 13.7 10/26/2018 1137   HCT 43.1 10/26/2018 1137   PLT 200 10/26/2018 1137   MCV 104.9 (H)  10/26/2018 1137   MCH 33.3 10/26/2018 1137   MCHC 31.8 10/26/2018 1137   RDW 13.3 10/26/2018 1137   LYMPHSABS 2.0 10/26/2018 1137   MONOABS 0.7 10/26/2018 1137   EOSABS 0.3 10/26/2018 1137   BASOSABS 0.1 10/26/2018 1137   CMP Latest Ref Rng & Units 10/26/2018 10/26/2017 08/26/2017  Glucose 70 - 99 mg/dL 105(H) 100(H) 112(H)  BUN 8 - 23 mg/dL 15 14 18   Creatinine 0.61 - 1.24 mg/dL 0.96 0.94 0.94  Sodium 135 - 145 mmol/L 142 139 140  Potassium 3.5 - 5.1 mmol/L 4.2 4.4 3.7  Chloride 98 - 111 mmol/L 107 107 109  CO2 22 - 32 mmol/L 27 24 25   Calcium 8.9 - 10.3 mg/dL 10.2 10.0 10.0  Total Protein 6.5 - 8.1 g/dL 7.7 7.8 -  Total Bilirubin 0.3 - 1.2 mg/dL 0.7 0.9 -  Alkaline Phos 38 - 126 U/L 49 59 -  AST 15 - 41 U/L 19 24 -  ALT 0 - 44 U/L 14 16 -       DIAGNOSTIC IMAGING:  I have independently reviewed the scans and discussed with the patient.     ASSESSMENT & PLAN:   Testicle cancer, right 1.  Stage Ib pure seminoma: -Status post right radical orchiectomy on 12/20/2012. -He opted for close surveillance rather than adjuvant chemotherapy. -Physical exam today is within normal limits.  Labs were also grossly within normal limits. -CT scan on 11/03/2018 did not show any evidence of metastatic disease.  Last tumor markers were in 2019 and were within normal limits. -As it is past 5 years, we will switch him to once a year visit.      Orders placed this encounter:  Orders Placed This Encounter  Procedures  . CBC with Differential/Platelet  . Comprehensive metabolic panel  . HCG, tumor marker  . AFP tumor marker  . Beta 2 microglobulin,  serum      [name], MD [place] [phone]

## 2018-11-11 ENCOUNTER — Encounter (HOSPITAL_COMMUNITY): Payer: Self-pay | Admitting: Hematology

## 2018-11-11 NOTE — Assessment & Plan Note (Signed)
1.  Stage Ib pure seminoma: -Status post right radical orchiectomy on 12/20/2012. -He opted for close surveillance rather than adjuvant chemotherapy. -Physical exam today is within normal limits.  Labs were also grossly within normal limits. -CT scan on 11/03/2018 did not show any evidence of metastatic disease.  Last tumor markers were in 2019 and were within normal limits. -As it is past 5 years, we will switch him to once a year visit.

## 2019-11-01 ENCOUNTER — Other Ambulatory Visit: Payer: Self-pay

## 2019-11-01 ENCOUNTER — Inpatient Hospital Stay (HOSPITAL_COMMUNITY): Payer: Medicare Other | Attending: Hematology

## 2019-11-01 DIAGNOSIS — Z9079 Acquired absence of other genital organ(s): Secondary | ICD-10-CM | POA: Diagnosis not present

## 2019-11-01 DIAGNOSIS — Z23 Encounter for immunization: Secondary | ICD-10-CM | POA: Insufficient documentation

## 2019-11-01 DIAGNOSIS — Z79899 Other long term (current) drug therapy: Secondary | ICD-10-CM | POA: Diagnosis not present

## 2019-11-01 DIAGNOSIS — E785 Hyperlipidemia, unspecified: Secondary | ICD-10-CM | POA: Insufficient documentation

## 2019-11-01 DIAGNOSIS — I2510 Atherosclerotic heart disease of native coronary artery without angina pectoris: Secondary | ICD-10-CM | POA: Insufficient documentation

## 2019-11-01 DIAGNOSIS — R5383 Other fatigue: Secondary | ICD-10-CM | POA: Diagnosis not present

## 2019-11-01 DIAGNOSIS — I10 Essential (primary) hypertension: Secondary | ICD-10-CM | POA: Insufficient documentation

## 2019-11-01 DIAGNOSIS — N400 Enlarged prostate without lower urinary tract symptoms: Secondary | ICD-10-CM | POA: Diagnosis not present

## 2019-11-01 DIAGNOSIS — M1990 Unspecified osteoarthritis, unspecified site: Secondary | ICD-10-CM | POA: Insufficient documentation

## 2019-11-01 DIAGNOSIS — Z791 Long term (current) use of non-steroidal anti-inflammatories (NSAID): Secondary | ICD-10-CM | POA: Insufficient documentation

## 2019-11-01 DIAGNOSIS — C6211 Malignant neoplasm of descended right testis: Secondary | ICD-10-CM | POA: Diagnosis not present

## 2019-11-01 DIAGNOSIS — F17210 Nicotine dependence, cigarettes, uncomplicated: Secondary | ICD-10-CM | POA: Insufficient documentation

## 2019-11-01 LAB — COMPREHENSIVE METABOLIC PANEL
ALT: 12 U/L (ref 0–44)
AST: 15 U/L (ref 15–41)
Albumin: 4.2 g/dL (ref 3.5–5.0)
Alkaline Phosphatase: 53 U/L (ref 38–126)
Anion gap: 7 (ref 5–15)
BUN: 14 mg/dL (ref 8–23)
CO2: 25 mmol/L (ref 22–32)
Calcium: 9.9 mg/dL (ref 8.9–10.3)
Chloride: 105 mmol/L (ref 98–111)
Creatinine, Ser: 0.98 mg/dL (ref 0.61–1.24)
GFR, Estimated: >60 mL/min (ref >60)
Glucose, Bld: 99 mg/dL (ref 70–99)
Potassium: 3.6 mmol/L (ref 3.5–5.1)
Sodium: 137 mmol/L (ref 135–145)
Total Bilirubin: 0.9 mg/dL (ref 0.3–1.2)
Total Protein: 7.4 g/dL (ref 6.5–8.1)

## 2019-11-01 LAB — CBC WITH DIFFERENTIAL/PLATELET
Abs Immature Granulocytes: 0.01 10*3/uL (ref 0.00–0.07)
Basophils Absolute: 0.1 10*3/uL (ref 0.0–0.1)
Basophils Relative: 1 %
Eosinophils Absolute: 0.2 10*3/uL (ref 0.0–0.5)
Eosinophils Relative: 3 %
HCT: 41.8 % (ref 39.0–52.0)
Hemoglobin: 13.6 g/dL (ref 13.0–17.0)
Immature Granulocytes: 0 %
Lymphocytes Relative: 37 %
Lymphs Abs: 2.5 10*3/uL (ref 0.7–4.0)
MCH: 33.9 pg (ref 26.0–34.0)
MCHC: 32.5 g/dL (ref 30.0–36.0)
MCV: 104.2 fL — ABNORMAL HIGH (ref 80.0–100.0)
Monocytes Absolute: 0.8 10*3/uL (ref 0.1–1.0)
Monocytes Relative: 11 %
Neutro Abs: 3.2 10*3/uL (ref 1.7–7.7)
Neutrophils Relative %: 48 %
Platelets: 220 10*3/uL (ref 150–400)
RBC: 4.01 MIL/uL — ABNORMAL LOW (ref 4.22–5.81)
RDW: 13.2 % (ref 11.5–15.5)
WBC: 6.8 10*3/uL (ref 4.0–10.5)
nRBC: 0 % (ref 0.0–0.2)

## 2019-11-02 LAB — AFP TUMOR MARKER: AFP, Serum, Tumor Marker: 2 ng/mL (ref 0.0–8.3)

## 2019-11-02 LAB — BETA 2 MICROGLOBULIN, SERUM: Beta-2 Microglobulin: 1.6 mg/L (ref 0.6–2.4)

## 2019-11-02 LAB — BETA HCG QUANT (REF LAB): hCG Quant: <1 m[IU]/mL (ref 0–3)

## 2019-11-08 ENCOUNTER — Inpatient Hospital Stay (HOSPITAL_BASED_OUTPATIENT_CLINIC_OR_DEPARTMENT_OTHER): Payer: Medicare Other | Admitting: Hematology

## 2019-11-08 VITALS — BP 141/64 | HR 93 | Temp 96.9°F | Resp 18 | Wt 194.8 lb

## 2019-11-08 DIAGNOSIS — C6211 Malignant neoplasm of descended right testis: Secondary | ICD-10-CM

## 2019-11-08 MED ORDER — INFLUENZA VAC A&B SA ADJ QUAD 0.5 ML IM PRSY
PREFILLED_SYRINGE | INTRAMUSCULAR | Status: AC
  Filled 2019-11-08: qty 0.5

## 2019-11-08 MED ORDER — INFLUENZA VAC A&B SA ADJ QUAD 0.5 ML IM PRSY
0.5000 mL | PREFILLED_SYRINGE | Freq: Once | INTRAMUSCULAR | Status: AC
  Administered 2019-11-08: 0.5 mL via INTRAMUSCULAR

## 2019-11-08 NOTE — Patient Instructions (Signed)
[place] at [facility] Discharge Instructions  You were seen today by Dr. Delton Coombes. He went over your recent results. You received your flu shot today. Dr. Delton Coombes will see you back in 1 year for labs and follow up.   Thank you for choosing [place] at [facility] to provide your oncology and hematology care.  To afford each patient quality time with our provider, please arrive at least 15 minutes before your scheduled appointment time.   If you have a lab appointment with the [facility] please come in thru the Main Entrance and check in at the main information desk  You need to re-schedule your appointment should you arrive 10 or more minutes late.  We strive to give you quality time with our providers, and arriving late affects you and other patients whose appointments are after yours.  Also, if you no show three or more times for appointments you may be dismissed from the clinic at the providers discretion.     Again, thank you for choosing [facility].  Our hope is that these requests will decrease the amount of time that you wait before being seen by our physicians.       _____________________________________________________________  Should you have questions after your visit to [facility], please contact our office at [phone] between the hours of 8:00 a.m. and 4:30 p.m.  Voicemails left after 4:00 p.m. will not be returned until the following business day.  For prescription refill requests, have your pharmacy contact our office and allow 72 hours.    Cancer Center Support Programs:   > Cancer Support Group  2nd Tuesday of the month 1pm-2pm, Journey Room

## 2019-11-08 NOTE — Progress Notes (Signed)
[facility] [place], [place] [address]   CLINIC:  Medical Oncology/Hematology  PCP:  [name], [place] / [place] [address] [phone]   REASON FOR VISIT:  Follow-up for right testicular seminoma  PRIOR THERAPY: Right radical orchiectomy on 12/20/2012  NGS Results: Not done  CURRENT THERAPY: Observation  BRIEF ONCOLOGIC HISTORY:  Oncology History  Testicle cancer, right  12/20/2012 Surgery   Right total orchiectomyt- 1. Testis, biopsy, right - SEMINOMA. PLEASE SEE COMMENT. 2. Testis, tumor, right - SEMINOMA, 7.5 CM. - ANGIOLYMPHATIC INVASION PRESENT. - RESECTION MARGINS, NEGATIVE FOR ATYPIA OR MALIGNANCY.   01/18/2013 PET scan   Status post right orchiectomy. No findings specific for metastatic disease. Small para-aortic nodes measuring up to 5 mm short axis, without convincing hypermetabolism. Given location, attention on follow-up is suggested.   04/17/2013 Imaging   CT CAP- No evidence of metastatic disease in the chest, abdomen or pelvis. Proximal LAD coronary artery calcification.   07/20/2013 Imaging   CT abd/pelvis- No evidence of metastatic disease in the abdomen or pelvis.   10/24/2013 Imaging   CT abd/pelvis- No findings to suggest metastatic disease in the abdomen or pelvis   10/24/2013 Imaging   Chest xray- Probable COPD. Negative for metastatic disease.   02/21/2014 Imaging   CT abd/pelvis- Stable abdominal pelvic CT status post right orchectomy. No evidence of adenopathy or other metastatic disease.   02/21/2014 Imaging   Chest xray- Left lower lobe mild atelectasis and/or infiltrate.    09/10/2014 Imaging   CT abd/pelvis- Status post right orchiectomy.  No evidence of metastatic disease.  3 mm nonobstructing right upper pole renal calculus. No hydronephrosis.   03/14/2015 Imaging   CT abd/pelvis- Stable exam. No evidence of metastatic disease or other acute findings within the abdomen or pelvis.     09/13/2015 Imaging   CT abd/pelvis- No acute findings and no evidence for mass or adenopathy.    04/24/2016 Imaging   CT abd/pelvis: IMPRESSION: 1. Stable exam. No new or progressive findings. No features to suggest metastatic disease.     CANCER STAGING: Cancer Staging Testicle cancer, right Staging form: Testis, AJCC 7th Edition - Clinical: Stage I (T2, N0, M0) - Signed by Baird Cancer, PA-C on 07/23/2013   INTERVAL HISTORY:  Mr. Nathaniel Hicks, a 79 y.o. male, returns for routine follow-up of his right testicular seminoma. Nathaniel Hicks was last seen on 11/08/2018.   Today he reports feeling well. He denies having any new pains or cough. He denies having any inguinal adenopathy or leg swelling.  He is open to getting the flu shot today.   REVIEW OF SYSTEMS:  Review of Systems  Constitutional: Positive for appetite change and fatigue.  Respiratory: Negative for cough.   Cardiovascular: Negative for leg swelling.  Musculoskeletal: Negative for arthralgias.  Hematological: Negative for adenopathy.    PAST MEDICAL/SURGICAL HISTORY:  Past Medical History:  Diagnosis Date  . Arthritis   . BPH (benign prostatic hyperplasia)   . HOH (hard of hearing)   . Hyperlipidemia 11/02/2017  . Hypertension   . Hypertension 11/02/2017  . 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[place])    2014  . Vitamin D deficiency 11/02/2017   Past Surgical History:  Procedure Laterality Date  . COLONOSCOPY N/A 11/24/2012   Procedure: COLONOSCOPY;  Surgeon: Rogene Houston, MD;  [facility];  Service: Endoscopy;  Laterality: N/A;  830-moved to [place] notified pt  . HEMORROIDECTOMY    . KNEE  ARTHROSCOPY WITH LATERAL MENISECTOMY Right 08/31/2017   Procedure: KNEE ARTHROSCOPY WITH LATERAL MENISECTOMY;  Surgeon: Carole Civil, MD;  Location: [facility];  Service: Orthopedics;  Laterality: Right;  . LESION EXCISION N/A 03/23/2012   Procedure: EXCISION NEOPLASM SCALP ;  Surgeon: Jamesetta So, MD;  Location: [facility]  ORS;  Service: General;  Laterality: N/A;  Excision of Scalp Neoplasm  . ORCHIECTOMY Right 12/20/2012   Procedure: RIGHT RADICAL ORCHIECTOMY/POSSIBLE BX RIGHT TESTICLE;  Surgeon: Marissa Nestle, MD;  Location: [facility];  Service: Urology;  Laterality: Right;  . PROSTATE SURGERY    . SCALP LACERATION REPAIR     [facility]-Dr Tamala Julian    SOCIAL HISTORY:  Social History   Socioeconomic History  . Marital status: Single    Spouse name: Not on file  . Number of children: Not on file  . Years of education: Not on file  . Highest education level: Not on file  Occupational History  . Not on file  Tobacco Use  . Smoking status: Current Every Day Smoker    Packs/day: 0.25    Years: 50.00    Pack years: 12.50    Types: Cigarettes  . Smokeless tobacco: Never Used  Vaping Use  . Vaping Use: Never used  Substance and Sexual Activity  . Alcohol use: Not Currently  . Drug use: No  . Sexual activity: Yes    Birth control/protection: None  Other Topics Concern  . Not on file  Social History Narrative  . Not on file   Social Determinants of Health   Financial Resource Strain:   . Difficulty of Paying Living Expenses: Not on file  Food Insecurity:   . Worried About Charity fundraiser in the Last Year: Not on file  . Ran Out of Food in the Last Year: Not on file  Transportation Needs:   . Lack of Transportation (Medical): Not on file  . Lack of Transportation (Non-Medical): Not on file  Physical Activity:   . Days of Exercise per Week: Not on file  . Minutes of Exercise per Session: Not on file  Stress:   . Feeling of Stress : Not on file  Social Connections:   . Frequency of Communication with Friends and Family: Not on file  . Frequency of Social Gatherings with Friends and Family: Not on file  . Attends Religious Services: Not on file  . Active Member of Clubs or Organizations: Not on file  . Attends Archivist Meetings: Not on file  . Marital Status: Not on file  Intimate  Partner Violence:   . Fear of Current or Ex-Partner: Not on file  . Emotionally Abused: Not on file  . Physically Abused: Not on file  . Sexually Abused: Not on file    FAMILY HISTORY:  Family History  Problem Relation Age of Onset  . Cancer Brother   . Cancer Brother     CURRENT MEDICATIONS:  Current Outpatient Medications  Medication Sig Dispense Refill  . amLODipine-benazepril (LOTREL) 10-20 MG per capsule 1 capsule daily.     . Cholecalciferol (VITAMIN D3) 50000 units TABS Take 5,000 Units by mouth daily.    Marland Kitchen gabapentin (NEURONTIN) 300 MG capsule Take 300 mg by mouth 2 (two) times daily.    . magnesium oxide (MAG-OX) 400 MG tablet Take 400 mg by mouth daily.    . Multiple Vitamins-Minerals (MULTIVITAMIN WITH MINERALS) tablet Take 1 tablet by mouth daily.    . naproxen (NAPROSYN) 500 MG tablet  Take 500 mg by mouth 2 (two) times daily.     Marland Kitchen oxyCODONE (OXY IR/ROXICODONE) 5 MG immediate release tablet Take 5 mg by mouth 3 (three) times daily as needed for pain.  0  . pravastatin (PRAVACHOL) 40 MG tablet Take 40 mg by mouth daily.    Marland Kitchen RAPAFLO 8 MG CAPS capsule Take 8 mg by mouth daily.      Current Facility-Administered Medications  Medication Dose Route Frequency Provider Last Rate Last Admin  . influenza vaccine adjuvanted (FLUAD) injection 0.5 mL  0.5 mL Intramuscular Once [name], MD        ALLERGIES:  No Known Allergies  PHYSICAL EXAM:  Performance status (ECOG): 1 - Symptomatic but completely ambulatory  Vitals:   11/08/19 1349  BP: (!) 141/64  Pulse: 93  Resp: 18  Temp: (!) 96.9 F (36.1 C)  SpO2: 96%   Wt Readings from Last 3 Encounters:  11/08/19 194 lb 12.8 oz (88.4 kg)  11/08/18 193 lb 5 oz (87.7 kg)  11/02/17 190 lb (86.2 kg)   Physical Exam Vitals reviewed.  Constitutional:      Appearance: Normal appearance. He is obese.  Cardiovascular:     Rate and Rhythm: Normal rate and regular rhythm.     Pulses: Normal pulses.     Heart  sounds: Normal heart sounds.  Pulmonary:     Effort: Pulmonary effort is normal.     Breath sounds: Normal breath sounds.  Abdominal:     Palpations: Abdomen is soft. There is no hepatomegaly, splenomegaly or mass.     Tenderness: There is no abdominal tenderness.     Hernia: No hernia is present.  Musculoskeletal:     Right lower leg: No edema.     Left lower leg: No edema.  Lymphadenopathy:     Upper Body:     Right upper body: No axillary or pectoral adenopathy.     Left upper body: No axillary or pectoral adenopathy.     Lower Body: No right inguinal adenopathy. No left inguinal adenopathy.  Neurological:     General: No focal deficit present.     Mental Status: He is alert and oriented to person, place, and time.  Psychiatric:        Mood and Affect: Mood normal.        Behavior: Behavior normal.      LABORATORY DATA:  I have reviewed the labs as listed.  CBC Latest Ref Rng & Units 11/01/2019 10/26/2018 10/26/2017  WBC 4.0 - 10.5 K/uL 6.8 5.6 7.2  Hemoglobin 13.0 - 17.0 g/dL 13.6 13.7 12.6(L)  Hematocrit 39 - 52 % 41.8 43.1 41.0  Platelets 150 - 400 K/uL 220 200 199   CMP Latest Ref Rng & Units 11/01/2019 10/26/2018 10/26/2017  Glucose 70 - 99 mg/dL 99 105(H) 100(H)  BUN 8 - 23 mg/dL 14 15 14   Creatinine 0.61 - 1.24 mg/dL 0.98 0.96 0.94  Sodium 135 - 145 mmol/L 137 142 139  Potassium 3.5 - 5.1 mmol/L 3.6 4.2 4.4  Chloride 98 - 111 mmol/L 105 107 107  CO2 22 - 32 mmol/L 25 27 24   Calcium 8.9 - 10.3 mg/dL 9.9 10.2 10.0  Total Protein 6.5 - 8.1 g/dL 7.4 7.7 7.8  Total Bilirubin 0.3 - 1.2 mg/dL 0.9 0.7 0.9  Alkaline Phos 38 - 126 U/L 53 49 59  AST 15 - 41 U/L 15 19 24   ALT 0 - 44 U/L 12 14 16     DIAGNOSTIC  IMAGING:  I have independently reviewed the scans and discussed with the patient. No results found.   ASSESSMENT:  1.  Stage Ib pure seminoma: 1.  Stage Ib pure seminoma: -Status post right radical orchiectomy on 12/20/2012. -He opted for close surveillance  rather than adjuvant chemotherapy. -Physical exam today is within normal limits.  Labs were also grossly within normal limits. -CT scan on 11/03/2018 did not show any evidence of metastatic disease.  Last tumor markers were in 2019 and were within normal limits.  PLAN:  1.  Stage Ib pure seminoma: -No clinical signs or symptoms of recurrence. -No palpable lymphadenopathy or hepatosplenomegaly. -Reviewed labs from 11/01/2019 which showed normal LFTs and CBC.  Tumor markers including beta hCG and AFP were normal. -RTC 1 year with repeat tumor markers and chemistries.    Orders placed this encounter:  Orders Placed This Encounter  Procedures  . CBC with Differential/Platelet  . Comprehensive metabolic panel  . HCG, tumor marker  . AFP tumor marker  . Lactate dehydrogenase     [name], MD [place] [phone]   I, Milinda Antis, am acting as a scribe for Dr. Sanda Linger.  I, [name] MD, have reviewed the above documentation for accuracy and completeness, and I agree with the above.

## 2019-11-23 ENCOUNTER — Ambulatory Visit: Payer: Medicare Other | Attending: Internal Medicine

## 2019-11-23 DIAGNOSIS — Z23 Encounter for immunization: Secondary | ICD-10-CM

## 2019-11-23 NOTE — Progress Notes (Signed)
   Covid-19 Vaccination Clinic  Name:  Nathaniel Hicks    MRN: [id number] DOB: 10-01-1940  11/23/2019  Nathaniel Hicks was observed post Covid-19 immunization for 15 minutes without incident. He was provided with Vaccine Information Sheet and instruction to access the V-Safe system.   Nathaniel Hicks was instructed to call 911 with any severe reactions post vaccine: Marland Kitchen Difficulty breathing  . Swelling of face and throat  . A fast heartbeat  . A bad rash all over body  . Dizziness and weakness

## 2020-02-22 NOTE — Telephone Encounter (Signed)
error 

## 2020-06-20 IMAGING — CT CT ABD-PELV W/ CM
3 of 5 series · 17 of 46 positions shown, 19 images · IV contrast (omnipaque)
Comparison: CT abdomen pelvis 10/23/2016.

CLINICAL DATA: Patient with history of testicular carcinoma.
Follow-up exam.

EXAM:
CT ABDOMEN AND PELVIS WITH CONTRAST
TECHNIQUE: Multidetector CT imaging of the abdomen and pelvis was performed
using the standard protocol following bolus administration of
intravenous contrast.
CONTRAST:  100mL OMNIPAQUE IOHEXOL 300 MG/ML  SOLN

[Series 2: axial st · axial · 0.73mm/px · z∈[+978,+1368]mm · 12 of 94 slices shown, 14 images]
[im 8/94  soft-tissue]
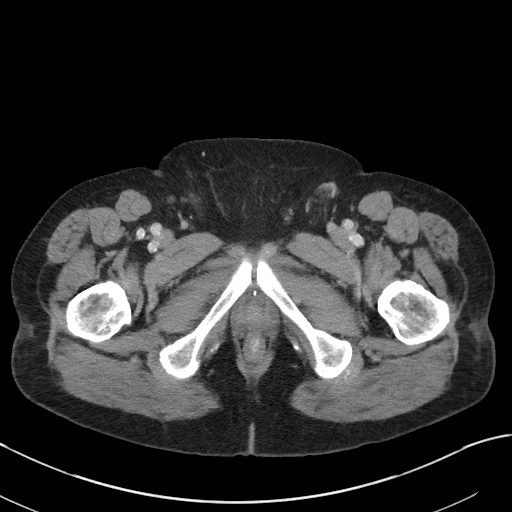
[im 8/94  bone]
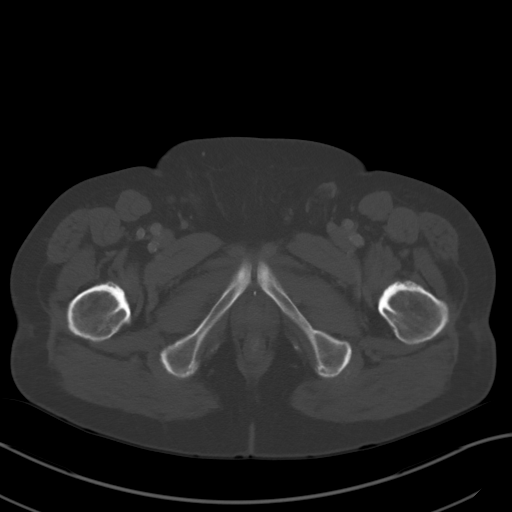
[im 15/94  soft-tissue]
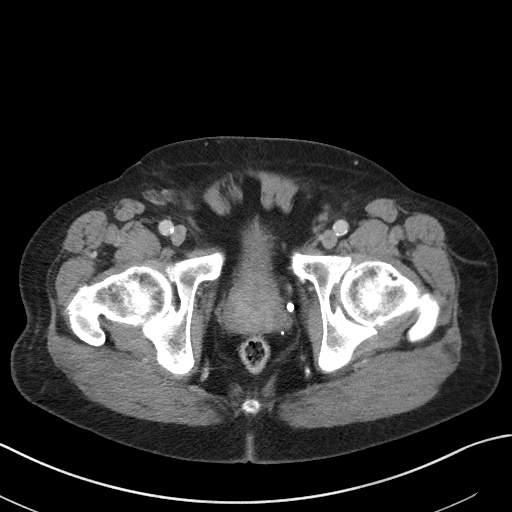
[im 22/94  soft-tissue]
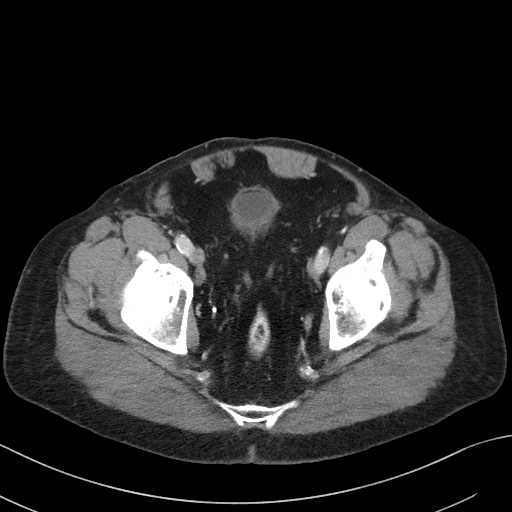
[im 29/94  soft-tissue]
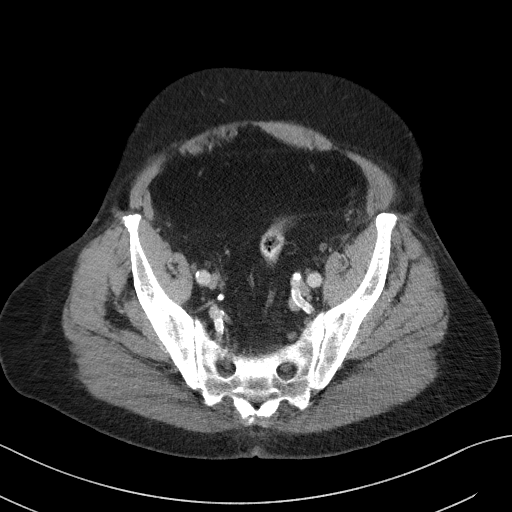
[im 36/94  soft-tissue]
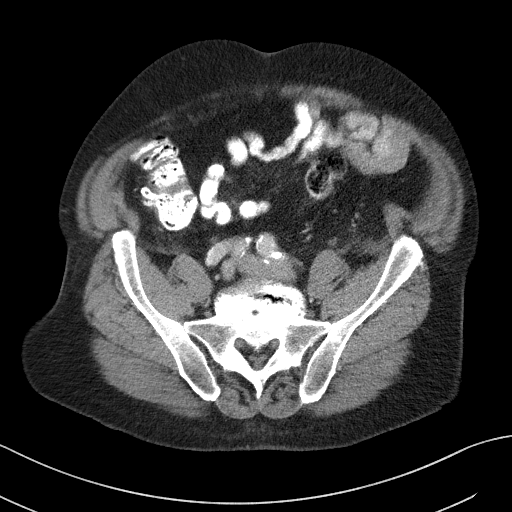
[im 43/94  soft-tissue]
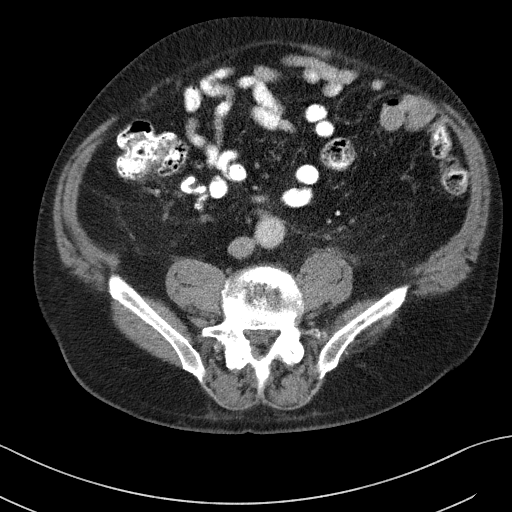
[im 51/94  soft-tissue]
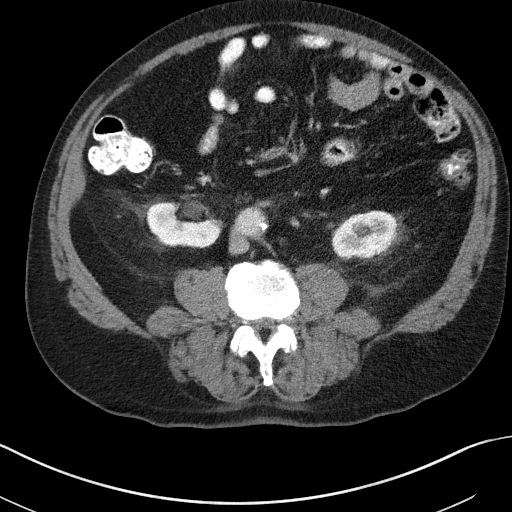
[im 58/94  soft-tissue]
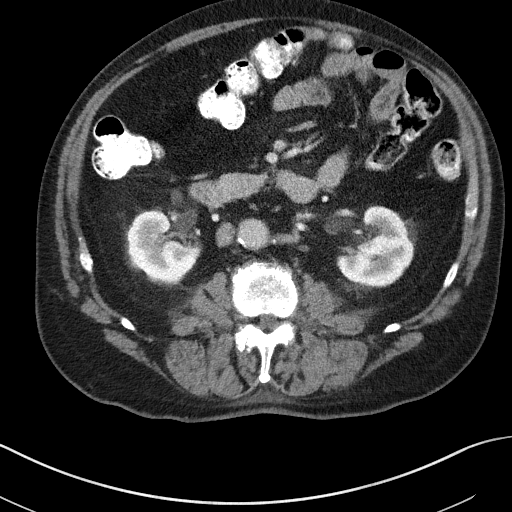
[im 65/94  soft-tissue]
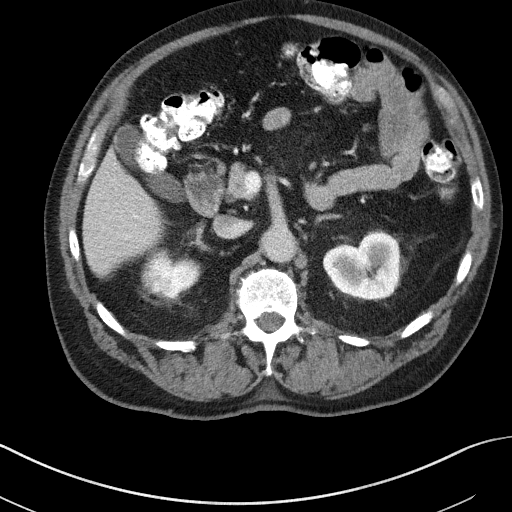
[im 65/94  bone]
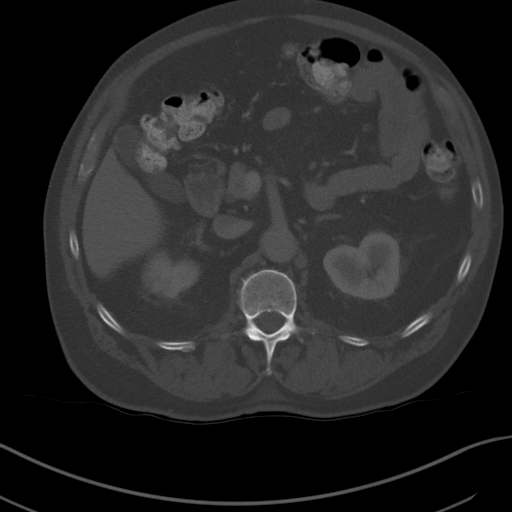
[im 72/94  soft-tissue]
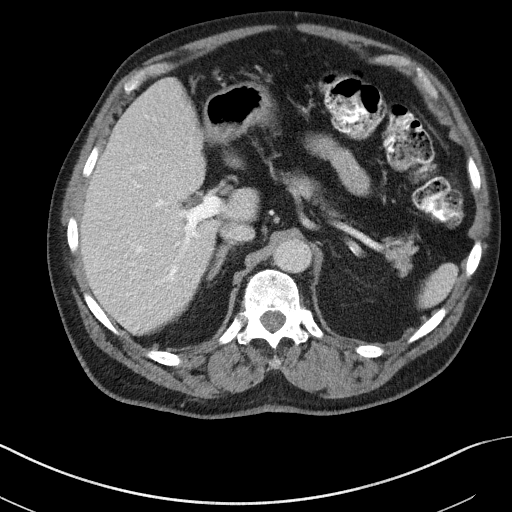
[im 79/94  soft-tissue]
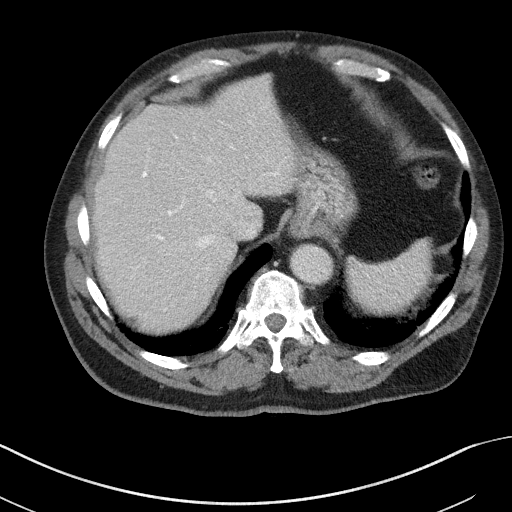
[im 86/94  soft-tissue]
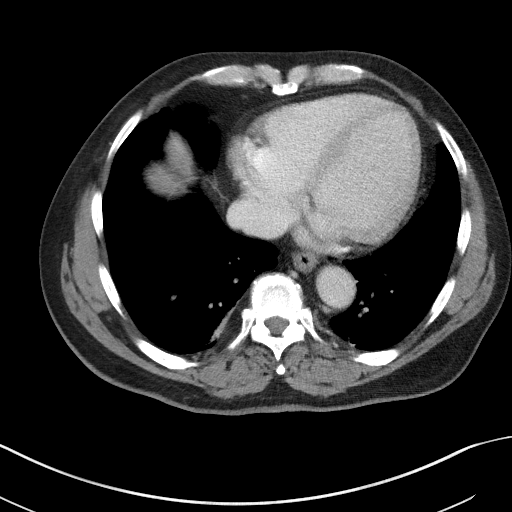

[Series 5: lung bases · axial · 0.73mm/px · z∈[+1254,+1270]mm · 2 of 85 slices shown]
[im 8/85  bone]
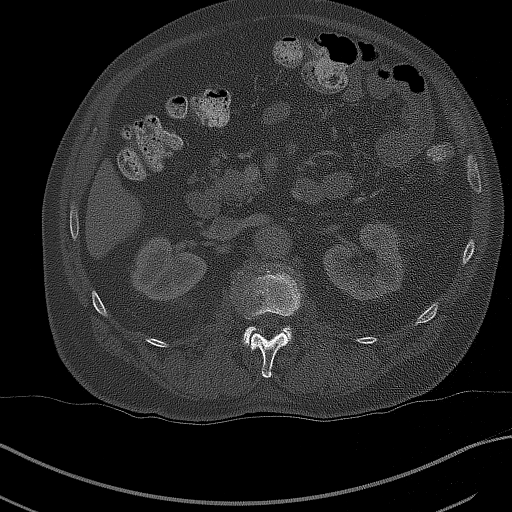
[im 16/85  bone]
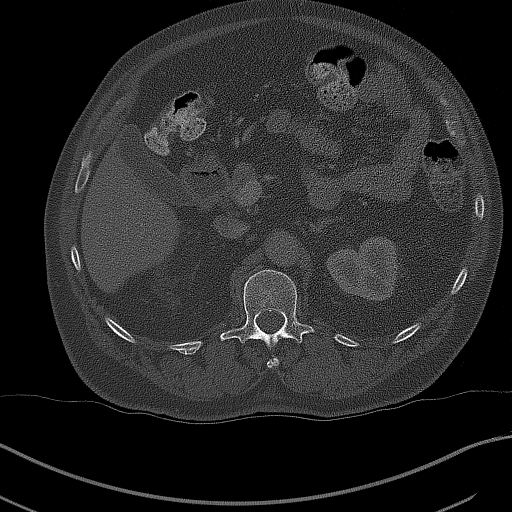

[Series 6: coronal st · coronal · 0.81mm/px · 3 of 117 slices shown]
[im 39/117  soft-tissue]
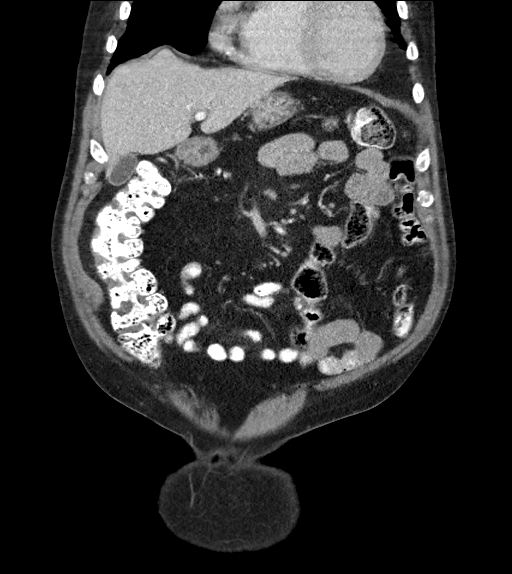
[im 52/117  soft-tissue]
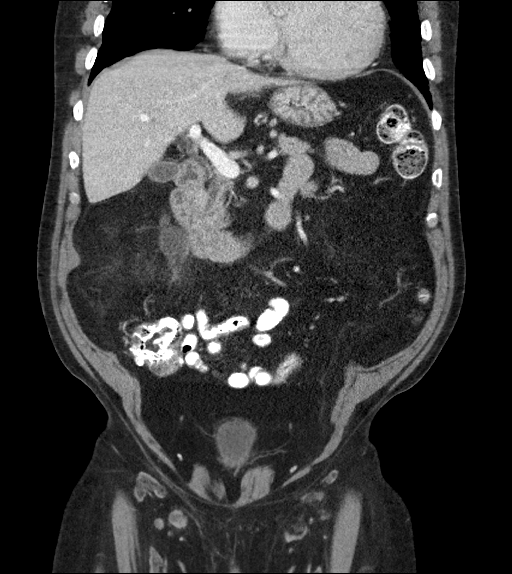
[im 65/117  soft-tissue]
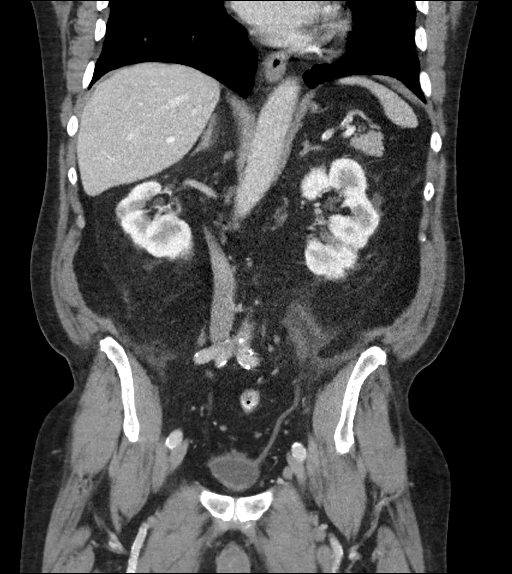

[17 of 46 positions shown; findings below may reference images not displayed]

FINDINGS: Lower chest: Heart is mildly enlarged. Subpleural consolidative
opacities within the right greater than left lower lobes, favored to
represent atelectasis.

Hepatobiliary: The liver is normal in size and contour. No focal
hepatic lesion is identified. Gallbladder is unremarkable. No
intrahepatic or extrahepatic biliary ductal dilatation.

Pancreas: Unremarkable

Spleen: Unremarkable

Adrenals/Urinary Tract: Normal adrenal glands. Kidneys are symmetric
in size. Stable small cyst interpolar region right kidney. Stable
bilateral perinephric fat stranding. Urinary bladder is
unremarkable.

Stomach/Bowel: Normal morphology of the stomach. No evidence for
small bowel obstruction. No free fluid or free intraperitoneal air.

Vascular/Lymphatic: Normal caliber abdominal aorta. Peripheral
calcified atherosclerotic plaque. No retroperitoneal
lymphadenopathy.

Reproductive: Heterogeneous prostate.

Other: None.

Musculoskeletal: Lumbar spine degenerative changes. No aggressive or
acute appearing osseous lesions.
IMPRESSION: No evidence for metastatic disease within the abdomen or pelvis.

## 2020-08-20 ENCOUNTER — Telehealth: Payer: Self-pay

## 2020-08-20 NOTE — Telephone Encounter (Signed)
Called Patient and told him [facility] declined PCP

## 2020-10-25 DIAGNOSIS — I10 Essential (primary) hypertension: Secondary | ICD-10-CM | POA: Diagnosis not present

## 2020-10-28 DIAGNOSIS — R7301 Impaired fasting glucose: Secondary | ICD-10-CM | POA: Diagnosis not present

## 2020-10-28 DIAGNOSIS — M1990 Unspecified osteoarthritis, unspecified site: Secondary | ICD-10-CM | POA: Diagnosis not present

## 2020-10-28 DIAGNOSIS — Z0001 Encounter for general adult medical examination with abnormal findings: Secondary | ICD-10-CM | POA: Diagnosis not present

## 2020-10-28 DIAGNOSIS — R252 Cramp and spasm: Secondary | ICD-10-CM | POA: Diagnosis not present

## 2020-10-28 DIAGNOSIS — M545 Low back pain, unspecified: Secondary | ICD-10-CM | POA: Diagnosis not present

## 2020-10-28 DIAGNOSIS — Z23 Encounter for immunization: Secondary | ICD-10-CM | POA: Diagnosis not present

## 2020-10-28 DIAGNOSIS — G894 Chronic pain syndrome: Secondary | ICD-10-CM | POA: Diagnosis not present

## 2020-10-28 DIAGNOSIS — G629 Polyneuropathy, unspecified: Secondary | ICD-10-CM | POA: Diagnosis not present

## 2020-11-05 ENCOUNTER — Encounter: Payer: Self-pay | Admitting: Urology

## 2020-11-05 ENCOUNTER — Ambulatory Visit (INDEPENDENT_AMBULATORY_CARE_PROVIDER_SITE_OTHER): Payer: Medicare Other | Admitting: Urology

## 2020-11-05 ENCOUNTER — Other Ambulatory Visit: Payer: Self-pay

## 2020-11-05 VITALS — BP 149/83 | HR 74

## 2020-11-05 DIAGNOSIS — R972 Elevated prostate specific antigen [PSA]: Secondary | ICD-10-CM | POA: Diagnosis not present

## 2020-11-05 DIAGNOSIS — N138 Other obstructive and reflux uropathy: Secondary | ICD-10-CM

## 2020-11-05 DIAGNOSIS — N401 Enlarged prostate with lower urinary tract symptoms: Secondary | ICD-10-CM

## 2020-11-05 DIAGNOSIS — Z8547 Personal history of malignant neoplasm of testis: Secondary | ICD-10-CM | POA: Diagnosis not present

## 2020-11-05 NOTE — Progress Notes (Signed)
Urological Symptom Review  Patient is experiencing the following symptoms: N/a   Review of Systems  Gastrointestinal (upper)  : Nausea  Gastrointestinal (lower) : Negative for lower GI symptoms  Constitutional : Negative for symptoms  Skin: Negative for skin symptoms  Eyes: Negative for eye symptoms  Ear/Nose/Throat : Sinus problems  Hematologic/Lymphatic: Negative for Hematologic/Lymphatic symptoms  Cardiovascular : Negative for cardiovascular symptoms  Respiratory : Negative for respiratory symptoms  Endocrine: Negative for endocrine symptoms  Musculoskeletal: Negative for musculoskeletal symptoms  Neurological: Negative for neurological symptoms  Psychologic: Negative for psychiatric symptoms

## 2020-11-05 NOTE — Progress Notes (Signed)
Assessment: 1. Elevated PSA; normal for age   80. History of primary testicular cancer; Stage 1b seminoma; s/p right radical orchiectomy 12/14   3. BPH with obstruction/lower urinary tract symptoms     Plan: I reviewed the patient's records including records regarding his testicular cancer including lab results, imaging results, and pathology results.  I also reviewed his recent PSA result. His PSA is within the normal age-specific range and is likely secondary to benign enlargement of the prostate.  I do not recommend any additional evaluation at this time. Return to office in 6 months  Chief Complaint:  Chief Complaint  Patient presents with   Elevated PSA     History of Present Illness:  Nathaniel Hicks is a 80 y.o. year old male who is seen in consultation from Valentino Nose, [place] for evaluation of elevated PSA. His recent PSA from 10/25/2020 was 4.4.  No prior results available for comparison.  He has a history of right testicular seminoma, stage Ib, T2 N0 M0.  He is status post a right radical orchiectomy in December 2014.  No evidence of metastatic disease on initial evaluation.  He has been followed with surveillance.  He has not had any evidence of recurrence or metastatic disease.  CT from 10/21 showed no evidence of metastatic disease or adenopathy.  Tumor markers were normal.  He is not having any significant lower urinary tract symptoms.  He does have nocturia x2.  No dysuria or gross hematuria.  He is currently on silodosin. AUA score = 9 today.   Past Medical History:  Past Medical History:  Diagnosis Date   Arthritis    BPH (benign prostatic hyperplasia)    HOH (hard of hearing)    Hyperlipidemia 11/02/2017   Hypertension    Hypertension 11/02/2017   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[facility])    2014   Vitamin D deficiency 11/02/2017    Past Surgical History:  Past Surgical History:  Procedure Laterality Date   COLONOSCOPY N/A 11/24/2012   Procedure: COLONOSCOPY;   Surgeon: Rogene Houston, MD;  [facility];  Service: Endoscopy;  Laterality: N/A;  830-moved to [place] notified pt   HEMORROIDECTOMY     KNEE ARTHROSCOPY WITH LATERAL MENISECTOMY Right 08/31/2017   Procedure: KNEE ARTHROSCOPY WITH LATERAL MENISECTOMY;  Surgeon: Carole Civil, MD;  Location: [facility];  Service: Orthopedics;  Laterality: Right;   LESION EXCISION N/A 03/23/2012   Procedure: EXCISION NEOPLASM SCALP ;  Surgeon: Jamesetta So, MD;  Location: [facility];  Service: General;  Laterality: N/A;  Excision of Scalp Neoplasm   ORCHIECTOMY Right 12/20/2012   Procedure: RIGHT RADICAL ORCHIECTOMY/POSSIBLE BX RIGHT TESTICLE;  Surgeon: Marissa Nestle, MD;  Location: [facility];  Service: Urology;  Laterality: Right;   PROSTATE SURGERY     SCALP LACERATION REPAIR     [facility]-Dr Tamala Julian    Allergies:  No Known Allergies  Family History:  Family History  Problem Relation Age of Onset   Cancer Brother    Cancer Brother     Social History:  Social History   Tobacco Use   Smoking status: Every Day    Packs/day: 0.25    Years: 50.00    Pack years: 12.50    Types: Cigarettes   Smokeless tobacco: Never  Vaping Use   Vaping Use: Never used  Substance Use Topics   Alcohol use: Not Currently   Drug use: No    Review of symptoms:  Constitutional:  Negative for unexplained  weight loss, night sweats, fever, chills ENT:  Negative for nose bleeds, sinus pain, painful swallowing CV:  Negative for chest pain, shortness of breath, exercise intolerance, palpitations, loss of consciousness Resp:  Negative for cough, wheezing, shortness of breath GI:  Negative for nausea, vomiting, diarrhea, bloody stools GU:  Positives noted in HPI; otherwise negative for gross hematuria, dysuria, urinary incontinence Neuro:  Negative for seizures, poor balance, limb weakness, slurred speech Psych:  Negative for lack of energy, depression, anxiety Endocrine:  Negative for polydipsia, polyuria, symptoms  of hypoglycemia (dizziness, hunger, sweating) Hematologic:  Negative for anemia, purpura, petechia, prolonged or excessive bleeding, use of anticoagulants  Allergic:  Negative for difficulty breathing or choking as a result of exposure to anything; no shellfish allergy; no allergic response (rash/itch) to materials, foods  Physical exam: BP (!) 149/83   Pulse 74  GENERAL APPEARANCE:  Well appearing, well developed, well nourished, NAD HEENT: Atraumatic, Normocephalic, oropharynx clear. NECK: Supple without lymphadenopathy or thyromegaly. LUNGS: Clear to auscultation bilaterally. HEART: Regular Rate and Rhythm without murmurs, gallops, or rubs. ABDOMEN: Soft, non-tender, No Masses. EXTREMITIES: Moves all extremities well.  Without clubbing, cyanosis, or edema. NEUROLOGIC:  Alert and oriented x 3, normal gait, CN II-XII grossly intact.  MENTAL STATUS:  Appropriate. BACK:  Non-tender to palpation.  No CVAT SKIN:  Warm, dry and intact.   GU: Penis:  uncircumcised Meatus: Normal Scrotum: normal, no masses Testis: right: absent; left normal Epididymis: left normal Prostate: 50 g, NT, no nodules Rectum: Normal tone,  no masses or tenderness   Results: U/A:  6-10 WBCs, few bacteria, nitrite negative

## 2020-11-06 LAB — URINALYSIS, ROUTINE W REFLEX MICROSCOPIC
Bilirubin, UA: NEGATIVE
Glucose, UA: NEGATIVE
Ketones, UA: NEGATIVE
Leukocytes,UA: NEGATIVE
Nitrite, UA: NEGATIVE
Protein,UA: NEGATIVE
Specific Gravity, UA: 1.02 (ref 1.005–1.030)
Urobilinogen, Ur: 2 mg/dL — ABNORMAL HIGH (ref 0.2–1.0)
pH, UA: 6 (ref 5.0–7.5)

## 2020-11-06 LAB — MICROSCOPIC EXAMINATION
RBC, Urine: NONE SEEN /hpf (ref 0–2)
Renal Epithel, UA: NONE SEEN /hpf

## 2020-11-11 ENCOUNTER — Other Ambulatory Visit (HOSPITAL_COMMUNITY): Payer: Self-pay | Admitting: *Deleted

## 2020-11-11 DIAGNOSIS — C6211 Malignant neoplasm of descended right testis: Secondary | ICD-10-CM

## 2020-11-12 ENCOUNTER — Other Ambulatory Visit: Payer: Self-pay

## 2020-11-12 ENCOUNTER — Inpatient Hospital Stay (HOSPITAL_COMMUNITY): Payer: Medicare Other | Attending: Hematology

## 2020-11-12 DIAGNOSIS — C6211 Malignant neoplasm of descended right testis: Secondary | ICD-10-CM

## 2020-11-12 DIAGNOSIS — F17210 Nicotine dependence, cigarettes, uncomplicated: Secondary | ICD-10-CM | POA: Diagnosis not present

## 2020-11-12 DIAGNOSIS — C6291 Malignant neoplasm of right testis, unspecified whether descended or undescended: Secondary | ICD-10-CM | POA: Diagnosis present

## 2020-11-12 DIAGNOSIS — I10 Essential (primary) hypertension: Secondary | ICD-10-CM | POA: Diagnosis not present

## 2020-11-12 LAB — COMPREHENSIVE METABOLIC PANEL
ALT: 13 U/L (ref 0–44)
AST: 17 U/L (ref 15–41)
Albumin: 4.2 g/dL (ref 3.5–5.0)
Alkaline Phosphatase: 52 U/L (ref 38–126)
Anion gap: 5 (ref 5–15)
BUN: 14 mg/dL (ref 8–23)
CO2: 25 mmol/L (ref 22–32)
Calcium: 9.6 mg/dL (ref 8.9–10.3)
Chloride: 109 mmol/L (ref 98–111)
Creatinine, Ser: 0.92 mg/dL (ref 0.61–1.24)
GFR, Estimated: >60 mL/min (ref >60)
Glucose, Bld: 108 mg/dL — ABNORMAL HIGH (ref 70–99)
Potassium: 3.6 mmol/L (ref 3.5–5.1)
Sodium: 139 mmol/L (ref 135–145)
Total Bilirubin: 1 mg/dL (ref 0.3–1.2)
Total Protein: 7.3 g/dL (ref 6.5–8.1)

## 2020-11-12 LAB — CBC WITH DIFFERENTIAL/PLATELET
Abs Immature Granulocytes: 0.02 10*3/uL (ref 0.00–0.07)
Basophils Absolute: 0.1 10*3/uL (ref 0.0–0.1)
Basophils Relative: 1 %
Eosinophils Absolute: 0.2 10*3/uL (ref 0.0–0.5)
Eosinophils Relative: 4 %
HCT: 40.7 % (ref 39.0–52.0)
Hemoglobin: 13.5 g/dL (ref 13.0–17.0)
Immature Granulocytes: 0 %
Lymphocytes Relative: 34 %
Lymphs Abs: 2.2 10*3/uL (ref 0.7–4.0)
MCH: 34.5 pg — ABNORMAL HIGH (ref 26.0–34.0)
MCHC: 33.2 g/dL (ref 30.0–36.0)
MCV: 104.1 fL — ABNORMAL HIGH (ref 80.0–100.0)
Monocytes Absolute: 0.7 10*3/uL (ref 0.1–1.0)
Monocytes Relative: 11 %
Neutro Abs: 3.2 10*3/uL (ref 1.7–7.7)
Neutrophils Relative %: 50 %
Platelets: 206 10*3/uL (ref 150–400)
RBC: 3.91 MIL/uL — ABNORMAL LOW (ref 4.22–5.81)
RDW: 13.3 % (ref 11.5–15.5)
WBC: 6.4 10*3/uL (ref 4.0–10.5)
nRBC: 0 % (ref 0.0–0.2)

## 2020-11-12 LAB — LACTATE DEHYDROGENASE: LDH: 172 U/L (ref 98–192)

## 2020-11-15 LAB — AFP TUMOR MARKER: AFP, Serum, Tumor Marker: 2.1 ng/mL (ref 0.0–8.4)

## 2020-11-15 LAB — BETA HCG QUANT (REF LAB): hCG Quant: <1 m[IU]/mL (ref 0–3)

## 2020-11-18 NOTE — Progress Notes (Signed)
[place], [place] [address]   CLINIC:  Medical Oncology/Hematology  PCP:  [name], [place] / [place] [id number] [phone]   REASON FOR VISIT:  Follow-up for right testicular seminoma  PRIOR THERAPY: Right radical orchiectomy on 12/20/2012  NGS Results: not done  CURRENT THERAPY: surveillance  BRIEF ONCOLOGIC HISTORY:  Oncology History  Testicle cancer, right  12/20/2012 Surgery   Right total orchiectomyt- 1. Testis, biopsy, right - SEMINOMA. PLEASE SEE COMMENT. 2. Testis, tumor, right - SEMINOMA, 7.5 CM. - ANGIOLYMPHATIC INVASION PRESENT. - RESECTION MARGINS, NEGATIVE FOR ATYPIA OR MALIGNANCY.   01/18/2013 PET scan   Status post right orchiectomy. No findings specific for metastatic disease. Small para-aortic nodes measuring up to 5 mm short axis, without convincing hypermetabolism. Given location, attention on follow-up is suggested.   04/17/2013 Imaging   CT CAP- No evidence of metastatic disease in the chest, abdomen or pelvis. Proximal LAD coronary artery calcification.   07/20/2013 Imaging   CT abd/pelvis- No evidence of metastatic disease in the abdomen or pelvis.   10/24/2013 Imaging   CT abd/pelvis- No findings to suggest metastatic disease in the abdomen or pelvis   10/24/2013 Imaging   Chest xray- Probable COPD.  Negative for metastatic disease.   02/21/2014 Imaging   CT abd/pelvis- Stable abdominal pelvic CT status post right orchectomy. No evidence of adenopathy or other metastatic disease.   02/21/2014 Imaging   Chest xray- Left lower lobe mild atelectasis and/or infiltrate.     09/10/2014 Imaging   CT abd/pelvis- Status post right orchiectomy.   No evidence of metastatic disease.   3 mm nonobstructing right upper pole renal calculus. No hydronephrosis.   03/14/2015 Imaging   CT abd/pelvis- Stable exam. No evidence of metastatic disease or other acute findings within the abdomen or pelvis.    09/13/2015 Imaging   CT abd/pelvis- No acute findings and no evidence for mass or adenopathy.    04/24/2016 Imaging   CT abd/pelvis: IMPRESSION: 1. Stable exam. No new or progressive findings. No features to suggest metastatic disease.     CANCER STAGING: Cancer Staging Testicle cancer, right Staging form: Testis, AJCC 7th Edition - Clinical: Stage I (T2, N0, M0) - Signed by Baird Cancer, PA-C on 07/23/2013   INTERVAL HISTORY:  Mr. Nathaniel Hicks, a 80 y.o. male, returns for routine follow-up of his right testicular seminoma. Nathaniel Hicks was last seen on 11/08/2019.   Today he reports feeling good.   REVIEW OF SYSTEMS:  Review of Systems  Constitutional:  Negative for appetite change (60%) and fatigue (50%).  All other systems reviewed and are negative.  PAST MEDICAL/SURGICAL HISTORY:  Past Medical History:  Diagnosis Date   Arthritis    BPH (benign prostatic hyperplasia)    HOH (hard of hearing)    Hyperlipidemia 11/02/2017   Hypertension    Hypertension 11/02/2017   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[place])    2014   Vitamin D deficiency 11/02/2017   Past Surgical History:  Procedure Laterality Date   COLONOSCOPY N/A 11/24/2012   Procedure: COLONOSCOPY;  Surgeon: Rogene Houston, MD;  [facility];  Service: Endoscopy;  Laterality: N/A;  830-moved to [place] notified pt   HEMORROIDECTOMY     KNEE ARTHROSCOPY WITH LATERAL MENISECTOMY Right 08/31/2017   Procedure: KNEE ARTHROSCOPY WITH LATERAL MENISECTOMY;  Surgeon: Carole Civil, MD;  Location: [facility];  Service: Orthopedics;  Laterality: Right;   LESION EXCISION N/A 03/23/2012  Procedure: EXCISION NEOPLASM SCALP ;  Surgeon: Jamesetta So, MD;  Location: [facility];  Service: General;  Laterality: N/A;  Excision of Scalp Neoplasm   ORCHIECTOMY Right 12/20/2012   Procedure: RIGHT RADICAL ORCHIECTOMY/POSSIBLE BX RIGHT TESTICLE;  Surgeon: Marissa Nestle, MD;  Location: [facility];  Service: Urology;  Laterality: Right;    PROSTATE SURGERY     SCALP LACERATION REPAIR     [facility]-Dr Tamala Julian    SOCIAL HISTORY:  Social History   Socioeconomic History   Marital status: Single    Spouse name: Not on file   Number of children: Not on file   Years of education: Not on file   Highest education level: Not on file  Occupational History   Not on file  Tobacco Use   Smoking status: Every Day    Packs/day: 0.25    Years: 50.00    Pack years: 12.50    Types: Cigarettes   Smokeless tobacco: Never  Vaping Use   Vaping Use: Never used  Substance and Sexual Activity   Alcohol use: Not Currently   Drug use: No   Sexual activity: Yes    Birth control/protection: None  Other Topics Concern   Not on file  Social History Narrative   Not on file   Social Determinants of Health   Financial Resource Strain: Not on file  Food Insecurity: Not on file  Transportation Needs: Not on file  Physical Activity: Not on file  Stress: Not on file  Social Connections: Not on file  Intimate Partner Violence: Not on file    FAMILY HISTORY:  Family History  Problem Relation Age of Onset   Cancer Brother    Cancer Brother     CURRENT MEDICATIONS:  Current Outpatient Medications  Medication Sig Dispense Refill   amLODipine-benazepril (LOTREL) 10-20 MG per capsule 1 capsule daily.      Cholecalciferol (VITAMIN D3) 50000 units TABS Take 5,000 Units by mouth daily.     gabapentin (NEURONTIN) 300 MG capsule Take 300 mg by mouth 2 (two) times daily.     magnesium oxide (MAG-OX) 400 MG tablet Take 400 mg by mouth daily.     Multiple Vitamins-Minerals (MULTIVITAMIN WITH MINERALS) tablet Take 1 tablet by mouth daily.     naproxen (NAPROSYN) 500 MG tablet Take 500 mg by mouth 2 (two) times daily.      oxyCODONE (OXY IR/ROXICODONE) 5 MG immediate release tablet Take 5 mg by mouth 3 (three) times daily as needed for pain.  0   pravastatin (PRAVACHOL) 40 MG tablet Take 40 mg by mouth daily.     RAPAFLO 8 MG CAPS capsule Take 8 mg by  mouth daily.      No current facility-administered medications for this visit.    ALLERGIES:  No Known Allergies  PHYSICAL EXAM:  Performance status (ECOG): 1 - Symptomatic but completely ambulatory  There were no vitals filed for this visit. Wt Readings from Last 3 Encounters:  11/08/19 194 lb 12.8 oz (88.4 kg)  11/08/18 193 lb 5 oz (87.7 kg)  11/02/17 190 lb (86.2 kg)   Physical Exam Vitals reviewed.  Constitutional:      Appearance: Normal appearance.  Cardiovascular:     Rate and Rhythm: Normal rate and regular rhythm.     Pulses: Normal pulses.     Heart sounds: Normal heart sounds.  Pulmonary:     Effort: Pulmonary effort is normal.     Breath sounds: Normal breath sounds.  Neurological:  General: No focal deficit present.     Mental Status: He is alert and oriented to person, place, and time.  Psychiatric:        Mood and Affect: Mood normal.        Behavior: Behavior normal.     LABORATORY DATA:  I have reviewed the labs as listed.  CBC Latest Ref Rng & Units 11/12/2020 11/01/2019 10/26/2018  WBC 4.0 - 10.5 K/uL 6.4 6.8 5.6  Hemoglobin 13.0 - 17.0 g/dL 13.5 13.6 13.7  Hematocrit 39.0 - 52.0 % 40.7 41.8 43.1  Platelets 150 - 400 K/uL 206 220 200   CMP Latest Ref Rng & Units 11/12/2020 11/01/2019 10/26/2018  Glucose 70 - 99 mg/dL 108(H) 99 105(H)  BUN 8 - 23 mg/dL 14 14 15   Creatinine 0.61 - 1.24 mg/dL 0.92 0.98 0.96  Sodium 135 - 145 mmol/L 139 137 142  Potassium 3.5 - 5.1 mmol/L 3.6 3.6 4.2  Chloride 98 - 111 mmol/L 109 105 107  CO2 22 - 32 mmol/L 25 25 27   Calcium 8.9 - 10.3 mg/dL 9.6 9.9 10.2  Total Protein 6.5 - 8.1 g/dL 7.3 7.4 7.7  Total Bilirubin 0.3 - 1.2 mg/dL 1.0 0.9 0.7  Alkaline Phos 38 - 126 U/L 52 53 49  AST 15 - 41 U/L 17 15 19   ALT 0 - 44 U/L 13 12 14     DIAGNOSTIC IMAGING:  I have independently reviewed the scans and discussed with the patient. No results found.   ASSESSMENT:  1.  Stage Ib pure seminoma: -Status post right  radical orchiectomy on 12/20/2012. -He opted for close surveillance rather than adjuvant chemotherapy. -Physical exam today is within normal limits.  Labs were also grossly within normal limits. -CT scan on 11/03/2018 did not show any evidence of metastatic disease.  Last tumor markers were in 2019 and were within normal limits.   PLAN:  1.  Stage Ib pure seminoma: - No clinical signs or symptoms of recurrence at this time. - No palpable adenopathy or hepatosplenomegaly. - Labs reviewed by me shows normal LFTs.  LDH, beta-hCG and AFP were normal.  CBC was grossly normal. - RTC 1 year for follow-up with repeat labs.   Orders placed this encounter:  No orders of the defined types were placed in this encounter.    [name], MD [place] [phone]   I, Thana Ates, am acting as a scribe for Dr. [name].  I, [name] MD, have reviewed the above documentation for accuracy and completeness, and I agree with the above.

## 2020-11-19 ENCOUNTER — Inpatient Hospital Stay (HOSPITAL_BASED_OUTPATIENT_CLINIC_OR_DEPARTMENT_OTHER): Payer: Medicare Other | Admitting: Hematology

## 2020-11-19 ENCOUNTER — Other Ambulatory Visit: Payer: Self-pay

## 2020-11-19 VITALS — BP 148/53 | HR 67 | Temp 98.8°F | Resp 18 | Wt 199.4 lb

## 2020-11-19 DIAGNOSIS — F17210 Nicotine dependence, cigarettes, uncomplicated: Secondary | ICD-10-CM | POA: Diagnosis not present

## 2020-11-19 DIAGNOSIS — C6291 Malignant neoplasm of right testis, unspecified whether descended or undescended: Secondary | ICD-10-CM | POA: Diagnosis not present

## 2020-11-19 DIAGNOSIS — C6211 Malignant neoplasm of descended right testis: Secondary | ICD-10-CM

## 2020-11-19 DIAGNOSIS — I10 Essential (primary) hypertension: Secondary | ICD-10-CM | POA: Diagnosis not present

## 2020-11-19 NOTE — Patient Instructions (Signed)
[place] at [facility] Discharge Instructions  You were seen and examined by Dr. Delton Coombes. He reviewed your most recent labs and everything looks good. Please follow up as scheduled in a year.   Thank you for choosing [place] at [facility] to provide your oncology and hematology care.  To afford each patient quality time with our provider, please arrive at least 15 minutes before your scheduled appointment time.   If you have a lab appointment with the [facility] please come in thru the Main Entrance and check in at the main information desk.  You need to re-schedule your appointment should you arrive 10 or more minutes late.  We strive to give you quality time with our providers, and arriving late affects you and other patients whose appointments are after yours.  Also, if you no show three or more times for appointments you may be dismissed from the clinic at the providers discretion.     Again, thank you for choosing [facility].  Our hope is that these requests will decrease the amount of time that you wait before being seen by our physicians.       _____________________________________________________________  Should you have questions after your visit to [facility], please contact our office at [phone] and follow the prompts.  Our office hours are 8:00 a.m. and 4:30 p.m. Monday - Friday.  Please note that voicemails left after 4:00 p.m. may not be returned until the following business day.  We are closed weekends and major holidays.  You do have access to a nurse 24-7, just call the main number to the clinic [phone] and do not press any options, hold on the line and a nurse will answer the phone.    For prescription refill requests, have your pharmacy contact our office and allow 72 hours.    Due to Covid, you will need to wear a mask upon entering the hospital. If you do not have a mask, a mask  will be given to you at the Main Entrance upon arrival. For doctor visits, patients may have 1 support person age 57 or older with them. For treatment visits, patients can not have anyone with them due to social distancing guidelines and our immunocompromised population.

## 2020-11-28 DIAGNOSIS — Z23 Encounter for immunization: Secondary | ICD-10-CM | POA: Diagnosis not present

## 2021-01-10 ENCOUNTER — Emergency Department (HOSPITAL_COMMUNITY): Payer: Medicare Other

## 2021-01-10 ENCOUNTER — Emergency Department (HOSPITAL_COMMUNITY)
Admission: EM | Admit: 2021-01-10 | Discharge: 2021-01-10 | Disposition: A | Discharge status: Home / Self Care | Payer: Medicare Other | Attending: Emergency Medicine | Admitting: Emergency Medicine

## 2021-01-10 ENCOUNTER — Other Ambulatory Visit: Payer: Self-pay

## 2021-01-10 ENCOUNTER — Encounter (HOSPITAL_COMMUNITY): Payer: Self-pay

## 2021-01-10 DIAGNOSIS — R509 Fever, unspecified: Secondary | ICD-10-CM | POA: Diagnosis present

## 2021-01-10 DIAGNOSIS — Z79899 Other long term (current) drug therapy: Secondary | ICD-10-CM | POA: Diagnosis not present

## 2021-01-10 DIAGNOSIS — Z96651 Presence of right artificial knee joint: Secondary | ICD-10-CM | POA: Insufficient documentation

## 2021-01-10 DIAGNOSIS — Z20822 Contact with and (suspected) exposure to covid-19: Secondary | ICD-10-CM | POA: Diagnosis not present

## 2021-01-10 DIAGNOSIS — F17210 Nicotine dependence, cigarettes, uncomplicated: Secondary | ICD-10-CM | POA: Insufficient documentation

## 2021-01-10 DIAGNOSIS — J111 Influenza due to unidentified influenza virus with other respiratory manifestations: Secondary | ICD-10-CM

## 2021-01-10 DIAGNOSIS — Z8547 Personal history of malignant neoplasm of testis: Secondary | ICD-10-CM | POA: Insufficient documentation

## 2021-01-10 DIAGNOSIS — J101 Influenza due to other identified influenza virus with other respiratory manifestations: Secondary | ICD-10-CM | POA: Diagnosis not present

## 2021-01-10 DIAGNOSIS — I10 Essential (primary) hypertension: Secondary | ICD-10-CM | POA: Insufficient documentation

## 2021-01-10 DIAGNOSIS — I517 Cardiomegaly: Secondary | ICD-10-CM | POA: Diagnosis not present

## 2021-01-10 DIAGNOSIS — R059 Cough, unspecified: Secondary | ICD-10-CM | POA: Diagnosis not present

## 2021-01-10 LAB — URINALYSIS, ROUTINE W REFLEX MICROSCOPIC
Bacteria, UA: NONE SEEN
Bilirubin Urine: NEGATIVE
Glucose, UA: NEGATIVE mg/dL
Ketones, ur: NEGATIVE mg/dL
Nitrite: NEGATIVE
Protein, ur: NEGATIVE mg/dL
Specific Gravity, Urine: 1.018 (ref 1.005–1.030)
pH: 5 (ref 5.0–8.0)

## 2021-01-10 LAB — COMPREHENSIVE METABOLIC PANEL
ALT: 14 U/L (ref 0–44)
AST: 19 U/L (ref 15–41)
Albumin: 4.1 g/dL (ref 3.5–5.0)
Alkaline Phosphatase: 59 U/L (ref 38–126)
Anion gap: 8 (ref 5–15)
BUN: 9 mg/dL (ref 8–23)
CO2: 25 mmol/L (ref 22–32)
Calcium: 9.5 mg/dL (ref 8.9–10.3)
Chloride: 104 mmol/L (ref 98–111)
Creatinine, Ser: 0.92 mg/dL (ref 0.61–1.24)
GFR, Estimated: >60 mL/min (ref >60)
Glucose, Bld: 106 mg/dL — ABNORMAL HIGH (ref 70–99)
Potassium: 3.5 mmol/L (ref 3.5–5.1)
Sodium: 137 mmol/L (ref 135–145)
Total Bilirubin: 1 mg/dL (ref 0.3–1.2)
Total Protein: 7.4 g/dL (ref 6.5–8.1)

## 2021-01-10 LAB — CBC WITH DIFFERENTIAL/PLATELET
Abs Immature Granulocytes: 0.03 10*3/uL (ref 0.00–0.07)
Basophils Absolute: 0.1 10*3/uL (ref 0.0–0.1)
Basophils Relative: 1 %
Eosinophils Absolute: 0.1 10*3/uL (ref 0.0–0.5)
Eosinophils Relative: 2 %
HCT: 41.6 % (ref 39.0–52.0)
Hemoglobin: 13.8 g/dL (ref 13.0–17.0)
Immature Granulocytes: 0 %
Lymphocytes Relative: 7 %
Lymphs Abs: 0.5 10*3/uL — ABNORMAL LOW (ref 0.7–4.0)
MCH: 34.6 pg — ABNORMAL HIGH (ref 26.0–34.0)
MCHC: 33.2 g/dL (ref 30.0–36.0)
MCV: 104.3 fL — ABNORMAL HIGH (ref 80.0–100.0)
Monocytes Absolute: 1.1 10*3/uL — ABNORMAL HIGH (ref 0.1–1.0)
Monocytes Relative: 14 %
Neutro Abs: 6 10*3/uL (ref 1.7–7.7)
Neutrophils Relative %: 76 %
Platelets: 201 10*3/uL (ref 150–400)
RBC: 3.99 MIL/uL — ABNORMAL LOW (ref 4.22–5.81)
RDW: 13.2 % (ref 11.5–15.5)
WBC: 7.7 10*3/uL (ref 4.0–10.5)
nRBC: 0 % (ref 0.0–0.2)

## 2021-01-10 LAB — LACTIC ACID, PLASMA: Lactic Acid, Venous: 1.3 mmol/L (ref 0.5–1.9)

## 2021-01-10 LAB — RESP PANEL BY RT-PCR (FLU A&B, COVID) ARPGX2
Influenza A by PCR: POSITIVE — AB
Influenza B by PCR: NEGATIVE
SARS Coronavirus 2 by RT PCR: NEGATIVE

## 2021-01-10 MED ORDER — OSELTAMIVIR PHOSPHATE 75 MG PO CAPS: 75.0000 mg | ORAL_CAPSULE | Freq: Two times a day (BID) | ORAL | 0 refills | Status: DC

## 2021-01-10 MED ORDER — ACETAMINOPHEN 325 MG PO TABS
650.0000 mg | ORAL_TABLET | Freq: Once | ORAL | Status: AC | PRN
  Administered 2021-01-10: 11:00:00 650 mg via ORAL
  Filled 2021-01-10: qty 2

## 2021-01-10 MED ORDER — ONDANSETRON HCL 4 MG PO TABS: 4.0000 mg | ORAL_TABLET | Freq: Four times a day (QID) | ORAL | 0 refills | Status: DC

## 2021-01-10 NOTE — ED Notes (Signed)
Pt ambulated around room for approximately 5 mins. 02 sats stayed between 94%-96%.

## 2021-01-10 NOTE — ED Triage Notes (Signed)
Pt presents to ED with complaints of fever up to 100.8 and cough x 2-3 days.

## 2021-01-10 NOTE — Discharge Instructions (Signed)
You have influenza, I provided with Zofran please use needed for nausea, I have also already on Tamiflu please take as prescribed.  Recommend over-the-counter pain medications like ibuprofen Tylenol for fever and pain control, nasal decongestions like Flonase and Zyrtec, Mucinex for cough.  If not eating recommend supplementing with Gatorade to help with electrolyte supplementation.  Follow-up PCP for further evaluation.  Come back to the emergency department if you develop chest pain, shortness of breath, severe abdominal pain, uncontrolled nausea, vomiting, diarrhea.

## 2021-01-10 NOTE — ED Provider Notes (Signed)
[facility] EMERGENCY DEPARTMENT Provider Note   CSN: [id number] Arrival date & time: 01/10/21  1028     History Chief Complaint  Patient presents with   Fever    Nathaniel Hicks is a 80 y.o. male.  HPI  Patient with medical history including hypertension, testicular cancer currently in remission, presents with URI-like symptoms.  Patient is started out 2 days ago, states he has been having fevers, chills, nasal congestion, nonproductive cough, and general body aches.  He denies any chest pain, shortness of breath, denies constipation, diarrhea, nausea, vomiting, states he still tolerating p.o., patient is vaccinated COVID influenza, currently not on immunosuppressants.  Has no other complaints this time.  States he is not take anything for his fevers or his URI-like symptoms.  Patient does not endorse any alleviating or aggravating factors at this time.  Past Medical History:  Diagnosis Date   Arthritis    BPH (benign prostatic hyperplasia)    HOH (hard of hearing)    Hyperlipidemia 11/02/2017   Hypertension    Hypertension 11/02/2017   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[place])    2014   Vitamin D deficiency 11/02/2017    Patient Active Problem List   Diagnosis Date Noted   BPH with obstruction/lower urinary tract symptoms 11/05/2020   History of primary testicular cancer; Stage 1b seminoma; s/p right radical orchiectomy 12/14 11/05/2020   Hypertension 11/02/2017   Vitamin D deficiency 11/02/2017   Hyperlipidemia 11/02/2017   Meniscus, lateral, derangement, right    S/P right knee arthroscopy 08/31/17 09/07/2017   Testicle cancer, right 12/20/2012    Past Surgical History:  Procedure Laterality Date   COLONOSCOPY N/A 11/24/2012   Procedure: COLONOSCOPY;  Surgeon: Rogene Houston, MD;  [facility];  Service: Endoscopy;  Laterality: N/A;  830-moved to [place] notified pt   HEMORROIDECTOMY     KNEE ARTHROSCOPY WITH LATERAL MENISECTOMY Right 08/31/2017   Procedure: KNEE  ARTHROSCOPY WITH LATERAL MENISECTOMY;  Surgeon: Carole Civil, MD;  Location: [facility];  Service: Orthopedics;  Laterality: Right;   LESION EXCISION N/A 03/23/2012   Procedure: EXCISION NEOPLASM SCALP ;  Surgeon: Jamesetta So, MD;  Location: [facility];  Service: General;  Laterality: N/A;  Excision of Scalp Neoplasm   ORCHIECTOMY Right 12/20/2012   Procedure: RIGHT RADICAL ORCHIECTOMY/POSSIBLE BX RIGHT TESTICLE;  Surgeon: Marissa Nestle, MD;  Location: [facility];  Service: Urology;  Laterality: Right;   PROSTATE SURGERY     SCALP LACERATION REPAIR     [facility]-Dr Tamala Julian       Family History  Problem Relation Age of Onset   Cancer Brother    Cancer Brother     Social History   Tobacco Use   Smoking status: Every Day    Packs/day: 0.25    Years: 50.00    Pack years: 12.50    Types: Cigarettes   Smokeless tobacco: Never  Vaping Use   Vaping Use: Never used  Substance Use Topics   Alcohol use: Not Currently   Drug use: No    Home Medications Prior to Admission medications   Medication Sig Start Date End Date Taking? Authorizing Provider  ondansetron (ZOFRAN) 4 MG tablet Take 1 tablet (4 mg total) by mouth every 6 (six) hours. 01/10/21  Yes Marcello Fennel, PA-C  oseltamivir (TAMIFLU) 75 MG capsule Take 1 capsule (75 mg total) by mouth every 12 (twelve) hours. 01/10/21  Yes Marcello Fennel, PA-C  amLODipine-benazepril (LOTREL) 10-20 MG per capsule 1 capsule  daily.  12/12/12   [provider]  Cholecalciferol (VITAMIN D3) 50000 units TABS Take 5,000 Units by mouth daily.    [provider]  gabapentin (NEURONTIN) 300 MG capsule Take 300 mg by mouth 2 (two) times daily.    [provider]  magnesium oxide (MAG-OX) 400 MG tablet Take 400 mg by mouth daily.    [provider]  meloxicam (MOBIC) 15 MG tablet Take 15 mg by mouth daily. 07/18/20   [provider]  Multiple Vitamins-Minerals (MULTIVITAMIN WITH MINERALS) tablet Take 1 tablet  by mouth daily.    [provider]  naproxen (NAPROSYN) 500 MG tablet Take 500 mg by mouth 2 (two) times daily.  10/21/12   [provider]  oxyCODONE (OXY IR/ROXICODONE) 5 MG immediate release tablet Take 5 mg by mouth 3 (three) times daily as needed for pain. Patient not taking: Reported on 11/19/2020 07/13/17   [provider]  pravastatin (PRAVACHOL) 40 MG tablet Take 40 mg by mouth daily.    [provider]  RAPAFLO 8 MG CAPS capsule Take 8 mg by mouth daily.  10/13/13   [provider]    Allergies    Patient has no known allergies.  Review of Systems   Review of Systems  Constitutional:  Positive for chills and fever.  HENT:  Positive for congestion. Negative for sore throat.   Respiratory:  Positive for cough. Negative for shortness of breath.   Cardiovascular:  Negative for chest pain.  Gastrointestinal:  Negative for abdominal pain, diarrhea, nausea and vomiting.  Genitourinary:  Negative for enuresis.  Musculoskeletal:  Negative for back pain.  Skin:  Negative for rash.  Neurological:  Negative for dizziness.  Hematological:  Does not bruise/bleed easily.   Physical Exam Updated Vital Signs BP (!) 143/61 (BP Location: Right Arm)    Pulse 98    Temp 99 F (37.2 C) (Oral)    Resp 20    Ht 5\' 11"  (1.803 m)    Wt 86.2 kg    SpO2 94%    BMI 26.50 kg/m   Physical Exam Vitals and nursing note reviewed.  Constitutional:      General: He is not in acute distress.    Appearance: He is not ill-appearing.  HENT:     Head: Normocephalic and atraumatic.     Right Ear: Tympanic membrane, ear canal and external ear normal.     Left Ear: Tympanic membrane, ear canal and external ear normal.     Nose: Congestion present.     Mouth/Throat:     Mouth: Mucous membranes are moist.     Pharynx: Oropharynx is clear. No oropharyngeal exudate or posterior oropharyngeal erythema.  Eyes:     Conjunctiva/sclera: Conjunctivae normal.  Cardiovascular:      Rate and Rhythm: Normal rate and regular rhythm.     Pulses: Normal pulses.     Heart sounds: No murmur heard.   No friction rub. No gallop.  Pulmonary:     Effort: No respiratory distress.     Breath sounds: No wheezing, rhonchi or rales.  Abdominal:     Palpations: Abdomen is soft.     Tenderness: There is no abdominal tenderness. There is no right CVA tenderness or left CVA tenderness.  Musculoskeletal:     Right lower leg: No edema.     Left lower leg: No edema.  Skin:    General: Skin is warm and dry.  Neurological:     Mental Status:  He is alert.  Psychiatric:        Mood and Affect: Mood normal.    ED Results / Procedures / Treatments   Labs (all labs ordered are listed, but only abnormal results are displayed) Labs Reviewed  RESP PANEL BY RT-PCR (FLU A&B, COVID) ARPGX2 - Abnormal; Notable for the following components:      Result Value   Influenza A by PCR POSITIVE (*)    All other components within normal limits  COMPREHENSIVE METABOLIC PANEL - Abnormal; Notable for the following components:   Glucose, Bld 106 (*)    All other components within normal limits  CBC WITH DIFFERENTIAL/PLATELET - Abnormal; Notable for the following components:   RBC 3.99 (*)    MCV 104.3 (*)    MCH 34.6 (*)    Lymphs Abs 0.5 (*)    Monocytes Absolute 1.1 (*)    All other components within normal limits  URINALYSIS, ROUTINE W REFLEX MICROSCOPIC - Abnormal; Notable for the following components:   APPearance HAZY (*)    Hgb urine dipstick SMALL (*)    Leukocytes,Ua SMALL (*)    All other components within normal limits  LACTIC ACID, PLASMA    EKG None  Radiology DG Chest Portable 1 View  Result Date: 01/10/2021 CLINICAL DATA:  Cough EXAM: PORTABLE CHEST 1 VIEW COMPARISON:  Chest x-ray 02/21/2014 FINDINGS: Heart is enlarged. Mediastinum appears stable. Pulmonary vasculature within normal limits. No focal consolidation identified. Minor likely subsegmental atelectasis or  scarring at the left lung base. IMPRESSION: Cardiomegaly with no acute process identified. Electronically Signed   By: Ofilia Neas M.D.   On: 01/10/2021 11:24    Procedures Procedures   Medications Ordered in ED Medications  acetaminophen (TYLENOL) tablet 650 mg (650 mg Oral Given 01/10/21 1113)    ED Course  I have reviewed the triage vital signs and the nursing notes.  Pertinent labs & imaging results that were available during my care of the patient were reviewed by me and considered in my medical decision making (see chart for details).    MDM Rules/Calculators/A&P                         Initial impression-presents with URI-like symptoms.  Alert, no acute stress, vital signs noted for fever, tachycardia.  I suspect likely viral URI, triage obtain basic lab work-up, will have triage ambulate the patient and reassess.  Work-up-CBC is unremarkable, CMP shows glucose of 106, UA shows small leukocytes, white blood cells, no bacteria present.  Lactic is 1.3 respiratory panel positive for flu.  Chest x-ray unremarkable.  Patient was ambulated O2 sats remained above 95%  Rule out- Low suspicion for systemic infection as patient is nontoxic-appearing, no leukocytosis present.  He did come in tachycardic with a fever, this has since resolved after providing Tylenol, this is like secondary due to influenza, he also had a negative lactate making sepsis less likely at this time.  Low suspicion for pneumonia as lung sounds are clear bilaterally, x-ray did not reveal any acute findings.  I have low suspicion for PE as patient denies pleuritic chest pain, shortness of breath, vital signs reassuring nontachypneic, nonhypoxic, presentation more consistent with URI.  Low suspicion for strep throat as oropharynx was visualized, no erythema or exudates noted.  Low suspicion patient would need  hospitalized due to viral infection or Covid as vital signs reassuring, patient is not in respiratory distress.     Plan-  Influenza-likely  cause of patient's symptoms, will recommend antiviral treatment due to his age and increased risk factors, will also provide him with Zofran to help with nausea.  Given strict return precautions.  Vital signs have remained stable, no indication for hospital admission.  Patient given at home care as well strict return precautions.  Patient verbalized that they understood agreed to said plan.     Final Clinical Impression(s) / ED Diagnoses Final diagnoses:  Influenza    Rx / DC Orders ED Discharge Orders          Ordered    ondansetron (ZOFRAN) 4 MG tablet  Every 6 hours        01/10/21 1350    oseltamivir (TAMIFLU) 75 MG capsule  Every 12 hours        01/10/21 1350             Marcello Fennel, PA-C 75/44/92 0100    Campbell Stall P, DO 71/21/97 2305

## 2021-01-23 DIAGNOSIS — I10 Essential (primary) hypertension: Secondary | ICD-10-CM | POA: Diagnosis not present

## 2021-01-23 DIAGNOSIS — R7301 Impaired fasting glucose: Secondary | ICD-10-CM | POA: Diagnosis not present

## 2021-01-29 DIAGNOSIS — G894 Chronic pain syndrome: Secondary | ICD-10-CM | POA: Diagnosis not present

## 2021-01-29 DIAGNOSIS — M545 Low back pain, unspecified: Secondary | ICD-10-CM | POA: Diagnosis not present

## 2021-01-29 DIAGNOSIS — G629 Polyneuropathy, unspecified: Secondary | ICD-10-CM | POA: Diagnosis not present

## 2021-01-29 DIAGNOSIS — M1990 Unspecified osteoarthritis, unspecified site: Secondary | ICD-10-CM | POA: Diagnosis not present

## 2021-01-29 DIAGNOSIS — E782 Mixed hyperlipidemia: Secondary | ICD-10-CM | POA: Diagnosis not present

## 2021-01-29 DIAGNOSIS — I10 Essential (primary) hypertension: Secondary | ICD-10-CM | POA: Diagnosis not present

## 2021-01-29 DIAGNOSIS — R252 Cramp and spasm: Secondary | ICD-10-CM | POA: Diagnosis not present

## 2021-01-29 DIAGNOSIS — D539 Nutritional anemia, unspecified: Secondary | ICD-10-CM | POA: Diagnosis not present

## 2021-01-29 DIAGNOSIS — D1730 Benign lipomatous neoplasm of skin and subcutaneous tissue of unspecified sites: Secondary | ICD-10-CM | POA: Diagnosis not present

## 2021-01-31 ENCOUNTER — Other Ambulatory Visit (HOSPITAL_COMMUNITY): Payer: Self-pay | Admitting: Family Medicine

## 2021-01-31 ENCOUNTER — Other Ambulatory Visit: Payer: Self-pay | Admitting: Family Medicine

## 2021-01-31 DIAGNOSIS — R102 Pelvic and perineal pain: Secondary | ICD-10-CM

## 2021-02-07 ENCOUNTER — Ambulatory Visit (HOSPITAL_COMMUNITY)
Admission: RE | Admit: 2021-02-07 | Discharge: 2021-02-07 | Disposition: A | Discharge status: Home / Self Care | Payer: Medicare Other | Source: Ambulatory Visit | Attending: Family Medicine | Admitting: Family Medicine

## 2021-02-07 ENCOUNTER — Other Ambulatory Visit: Payer: Self-pay

## 2021-02-07 DIAGNOSIS — R102 Pelvic and perineal pain: Secondary | ICD-10-CM | POA: Diagnosis present

## 2021-05-02 DIAGNOSIS — R7301 Impaired fasting glucose: Secondary | ICD-10-CM | POA: Diagnosis not present

## 2021-05-02 DIAGNOSIS — E559 Vitamin D deficiency, unspecified: Secondary | ICD-10-CM | POA: Diagnosis not present

## 2021-05-02 DIAGNOSIS — M1990 Unspecified osteoarthritis, unspecified site: Secondary | ICD-10-CM | POA: Diagnosis not present

## 2021-05-02 DIAGNOSIS — I10 Essential (primary) hypertension: Secondary | ICD-10-CM | POA: Diagnosis not present

## 2021-05-06 ENCOUNTER — Ambulatory Visit (INDEPENDENT_AMBULATORY_CARE_PROVIDER_SITE_OTHER): Payer: Medicare Other | Admitting: Urology

## 2021-05-06 ENCOUNTER — Encounter: Payer: Self-pay | Admitting: Urology

## 2021-05-06 VITALS — BP 138/55 | HR 88

## 2021-05-06 DIAGNOSIS — Z8547 Personal history of malignant neoplasm of testis: Secondary | ICD-10-CM

## 2021-05-06 DIAGNOSIS — N401 Enlarged prostate with lower urinary tract symptoms: Secondary | ICD-10-CM

## 2021-05-06 DIAGNOSIS — R972 Elevated prostate specific antigen [PSA]: Secondary | ICD-10-CM

## 2021-05-06 DIAGNOSIS — N138 Other obstructive and reflux uropathy: Secondary | ICD-10-CM | POA: Diagnosis not present

## 2021-05-06 NOTE — Progress Notes (Signed)
? ?Assessment: ?1. BPH with obstruction/lower urinary tract symptoms   ?2. Elevated PSA; normal for age   ?3. 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 ? ?Plan: ?Continue silodosin. ?His PSA from 10/22 was normal for age specific range and likely secondary to BPH.   ?Routine PSA testing not recommended for his age group. ?Return to office in 6 months ? ?Chief Complaint:  ?Chief Complaint  ?Patient presents with  ? Benign Prostatic Hypertrophy  ? ?History of Present Illness: ? ?Nathaniel Hicks is a 81 y.o. year old male who is seen for continued evaluation of elevated PSA, BPH with obstruction, and history of testicular cancer. ?His PSA from 10/25/2020 was 4.4.  No prior results available for comparison. ? ?He has a history of right testicular seminoma, stage Ib, T2 N0 M0.  He is status post a right radical orchiectomy in December 2014.  No evidence of metastatic disease on initial evaluation.  He has been followed with surveillance.  He has not had any evidence of recurrence or metastatic disease.  CT from 10/20 showed no evidence of metastatic disease or adenopathy.  Tumor markers from 11/22 were normal. ?He is followed by Dr. Delton Coombes.   ? ?He has a history of BPH with lower urinary tract symptoms.  He has nocturia x2.  He has been managed with silodosin. ?IPSS = 9. ?DRE demonstrated a 50 g gland without tenderness or nodularity. ? ?He returns today for follow-up.  He continues on silodosin.  He reports that his lower urinary tract symptoms are overall stable.  No dysuria or gross hematuria.  No flank pain or weight loss. ?IPSS = 15 today. ? ?Portions of the above documentation were copied from a prior visit for review purposes only. ? ? ?Past Medical History:  ?Past Medical History:  ?Diagnosis Date  ? Arthritis   ? BPH (benign prostatic hyperplasia)   ? HOH (hard of hearing)   ? Hyperlipidemia 11/02/2017  ? Hypertension   ? Hypertension 11/02/2017  ?  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[place])   ? 2014  ? Vitamin D deficiency 11/02/2017  ? ? ?Past Surgical History:  ?Past Surgical History:  ?Procedure Laterality Date  ? COLONOSCOPY N/A 11/24/2012  ? Procedure: COLONOSCOPY;  Surgeon: Rogene Houston, MD;  [facility];  Service: Endoscopy;  Laterality: N/A;  830-moved to [place] notified pt  ? HEMORROIDECTOMY    ? KNEE ARTHROSCOPY WITH LATERAL MENISECTOMY Right 08/31/2017  ? Procedure: KNEE ARTHROSCOPY WITH LATERAL MENISECTOMY;  Surgeon: Carole Civil, MD;  Location: [facility];  Service: Orthopedics;  Laterality: Right;  ? LESION EXCISION N/A 03/23/2012  ? Procedure: EXCISION NEOPLASM SCALP ;  Surgeon: Jamesetta So, MD;  Location: [facility];  Service: General;  Laterality: N/A;  Excision of Scalp Neoplasm  ? ORCHIECTOMY Right 12/20/2012  ? Procedure: RIGHT RADICAL ORCHIECTOMY/POSSIBLE BX RIGHT TESTICLE;  Surgeon: Marissa Nestle, MD;  Location: [facility];  Service: Urology;  Laterality: Right;  ? PROSTATE SURGERY    ? SCALP LACERATION REPAIR    ? [facility]-Dr Tamala Julian  ? ? ?Allergies:  ?No Known Allergies ? ?Family History:  ?Family History  ?Problem Relation Age of Onset  ? Cancer Brother   ? Cancer Brother   ? ? ?Social History:  ?Social History  ? ?Tobacco Use  ? Smoking status: Every Day  ?  Packs/day: 0.25  ?  Years: 50.00  ?  Pack years: 12.50  ?  Types:  Cigarettes  ? Smokeless tobacco: Never  ?Vaping Use  ? Vaping Use: Never used  ?Substance Use Topics  ? Alcohol use: Not Currently  ? Drug use: No  ? ? ?ROS: ?Constitutional:  Negative for fever, chills, weight loss ?CV: Negative for chest pain, previous MI, hypertension ?Respiratory:  Negative for shortness of breath, wheezing, sleep apnea, frequent cough ?GI:  Negative for nausea, vomiting, bloody stool, GERD ? ?Physical exam: ?BP (!) 138/55   Pulse 88  ?GENERAL APPEARANCE:  Well appearing, well developed, well nourished, NAD ?HEENT:  Atraumatic, normocephalic, oropharynx clear ?NECK:  Supple without lymphadenopathy or  thyromegaly ?ABDOMEN:  Soft, non-tender, no masses ?EXTREMITIES:  Moves all extremities well, without clubbing, cyanosis, or edema ?NEUROLOGIC:  Alert and oriented x 3, normal gait, CN II-XII grossly intact ?MENTAL STATUS:  appropriate ?BACK:  Non-tender to palpation, No CVAT ?SKIN:  Warm, dry, and intact ? ? ?Results: ?U/A: 6-10 WBC, >10 epithelial cells, few bacteria  ?

## 2021-05-07 LAB — URINALYSIS, ROUTINE W REFLEX MICROSCOPIC
Bilirubin, UA: NEGATIVE
Glucose, UA: NEGATIVE
Nitrite, UA: NEGATIVE
RBC, UA: NEGATIVE
Specific Gravity, UA: 1.02 (ref 1.005–1.030)
Urobilinogen, Ur: 4 mg/dL — ABNORMAL HIGH (ref 0.2–1.0)
pH, UA: 6.5 (ref 5.0–7.5)

## 2021-05-07 LAB — MICROSCOPIC EXAMINATION
Epithelial Cells (non renal): >10 /hpf — AB (ref 0–10)
RBC, Urine: NONE SEEN /hpf (ref 0–2)
Renal Epithel, UA: NONE SEEN /hpf

## 2021-05-08 DIAGNOSIS — R252 Cramp and spasm: Secondary | ICD-10-CM | POA: Diagnosis not present

## 2021-05-08 DIAGNOSIS — E782 Mixed hyperlipidemia: Secondary | ICD-10-CM | POA: Diagnosis not present

## 2021-05-08 DIAGNOSIS — I10 Essential (primary) hypertension: Secondary | ICD-10-CM | POA: Diagnosis not present

## 2021-05-08 DIAGNOSIS — D1730 Benign lipomatous neoplasm of skin and subcutaneous tissue of unspecified sites: Secondary | ICD-10-CM | POA: Diagnosis not present

## 2021-05-08 DIAGNOSIS — M545 Low back pain, unspecified: Secondary | ICD-10-CM | POA: Diagnosis not present

## 2021-05-08 DIAGNOSIS — R809 Proteinuria, unspecified: Secondary | ICD-10-CM | POA: Diagnosis not present

## 2021-05-08 DIAGNOSIS — E559 Vitamin D deficiency, unspecified: Secondary | ICD-10-CM | POA: Diagnosis not present

## 2021-05-08 DIAGNOSIS — G629 Polyneuropathy, unspecified: Secondary | ICD-10-CM | POA: Diagnosis not present

## 2021-05-08 DIAGNOSIS — D539 Nutritional anemia, unspecified: Secondary | ICD-10-CM | POA: Diagnosis not present

## 2021-05-08 DIAGNOSIS — M1990 Unspecified osteoarthritis, unspecified site: Secondary | ICD-10-CM | POA: Diagnosis not present

## 2021-05-22 DIAGNOSIS — I10 Essential (primary) hypertension: Secondary | ICD-10-CM | POA: Diagnosis not present

## 2021-05-22 DIAGNOSIS — M79604 Pain in right leg: Secondary | ICD-10-CM | POA: Diagnosis not present

## 2021-05-22 DIAGNOSIS — M79605 Pain in left leg: Secondary | ICD-10-CM | POA: Diagnosis not present

## 2021-07-14 ENCOUNTER — Inpatient Hospital Stay (HOSPITAL_COMMUNITY)
Admission: EM | Admit: 2021-07-14 | Discharge: 2021-07-23 | DRG: 853 | Disposition: A | Discharge status: Home Health | Payer: Medicare Other | Attending: Student | Admitting: Student

## 2021-07-14 ENCOUNTER — Encounter (HOSPITAL_COMMUNITY): Payer: Self-pay | Admitting: Emergency Medicine

## 2021-07-14 ENCOUNTER — Inpatient Hospital Stay (HOSPITAL_COMMUNITY): Payer: Medicare Other

## 2021-07-14 ENCOUNTER — Emergency Department (HOSPITAL_COMMUNITY): Payer: Medicare Other

## 2021-07-14 ENCOUNTER — Other Ambulatory Visit: Payer: Self-pay

## 2021-07-14 DIAGNOSIS — Z79899 Other long term (current) drug therapy: Secondary | ICD-10-CM

## 2021-07-14 DIAGNOSIS — R112 Nausea with vomiting, unspecified: Secondary | ICD-10-CM | POA: Diagnosis not present

## 2021-07-14 DIAGNOSIS — R197 Diarrhea, unspecified: Secondary | ICD-10-CM | POA: Diagnosis not present

## 2021-07-14 DIAGNOSIS — R109 Unspecified abdominal pain: Secondary | ICD-10-CM | POA: Diagnosis not present

## 2021-07-14 DIAGNOSIS — Z9079 Acquired absence of other genital organ(s): Secondary | ICD-10-CM | POA: Diagnosis not present

## 2021-07-14 DIAGNOSIS — R140 Abdominal distension (gaseous): Secondary | ICD-10-CM | POA: Diagnosis not present

## 2021-07-14 DIAGNOSIS — R5381 Other malaise: Secondary | ICD-10-CM

## 2021-07-14 DIAGNOSIS — Z85828 Personal history of other malignant neoplasm of skin: Secondary | ICD-10-CM | POA: Diagnosis not present

## 2021-07-14 DIAGNOSIS — R220 Localized swelling, mass and lump, head: Principal | ICD-10-CM | POA: Diagnosis present

## 2021-07-14 DIAGNOSIS — M19012 Primary osteoarthritis, left shoulder: Secondary | ICD-10-CM | POA: Diagnosis not present

## 2021-07-14 DIAGNOSIS — R531 Weakness: Secondary | ICD-10-CM | POA: Diagnosis present

## 2021-07-14 DIAGNOSIS — Z8547 Personal history of malignant neoplasm of testis: Secondary | ICD-10-CM

## 2021-07-14 DIAGNOSIS — I517 Cardiomegaly: Secondary | ICD-10-CM | POA: Diagnosis not present

## 2021-07-14 DIAGNOSIS — F17200 Nicotine dependence, unspecified, uncomplicated: Secondary | ICD-10-CM | POA: Diagnosis not present

## 2021-07-14 DIAGNOSIS — K122 Cellulitis and abscess of mouth: Secondary | ICD-10-CM | POA: Diagnosis not present

## 2021-07-14 DIAGNOSIS — F17210 Nicotine dependence, cigarettes, uncomplicated: Secondary | ICD-10-CM | POA: Diagnosis present

## 2021-07-14 DIAGNOSIS — T41295A Adverse effect of other general anesthetics, initial encounter: Secondary | ICD-10-CM | POA: Diagnosis present

## 2021-07-14 DIAGNOSIS — J9811 Atelectasis: Secondary | ICD-10-CM | POA: Diagnosis not present

## 2021-07-14 DIAGNOSIS — Z809 Family history of malignant neoplasm, unspecified: Secondary | ICD-10-CM | POA: Diagnosis not present

## 2021-07-14 DIAGNOSIS — A419 Sepsis, unspecified organism: Secondary | ICD-10-CM | POA: Diagnosis not present

## 2021-07-14 DIAGNOSIS — I9589 Other hypotension: Secondary | ICD-10-CM | POA: Diagnosis not present

## 2021-07-14 DIAGNOSIS — I248 Other forms of acute ischemic heart disease: Secondary | ICD-10-CM | POA: Diagnosis not present

## 2021-07-14 DIAGNOSIS — R778 Other specified abnormalities of plasma proteins: Secondary | ICD-10-CM

## 2021-07-14 DIAGNOSIS — Z781 Physical restraint status: Secondary | ICD-10-CM | POA: Diagnosis not present

## 2021-07-14 DIAGNOSIS — J9690 Respiratory failure, unspecified, unspecified whether with hypoxia or hypercapnia: Secondary | ICD-10-CM | POA: Diagnosis not present

## 2021-07-14 DIAGNOSIS — K113 Abscess of salivary gland: Secondary | ICD-10-CM | POA: Diagnosis not present

## 2021-07-14 DIAGNOSIS — K118 Other diseases of salivary glands: Secondary | ICD-10-CM | POA: Diagnosis not present

## 2021-07-14 DIAGNOSIS — M19011 Primary osteoarthritis, right shoulder: Secondary | ICD-10-CM | POA: Diagnosis not present

## 2021-07-14 DIAGNOSIS — R001 Bradycardia, unspecified: Secondary | ICD-10-CM | POA: Diagnosis present

## 2021-07-14 DIAGNOSIS — C6290 Malignant neoplasm of unspecified testis, unspecified whether descended or undescended: Secondary | ICD-10-CM | POA: Diagnosis present

## 2021-07-14 DIAGNOSIS — J9601 Acute respiratory failure with hypoxia: Secondary | ICD-10-CM | POA: Diagnosis not present

## 2021-07-14 DIAGNOSIS — C049 Malignant neoplasm of floor of mouth, unspecified: Secondary | ICD-10-CM | POA: Diagnosis not present

## 2021-07-14 DIAGNOSIS — R000 Tachycardia, unspecified: Secondary | ICD-10-CM | POA: Diagnosis not present

## 2021-07-14 DIAGNOSIS — R451 Restlessness and agitation: Secondary | ICD-10-CM | POA: Diagnosis not present

## 2021-07-14 DIAGNOSIS — E785 Hyperlipidemia, unspecified: Secondary | ICD-10-CM | POA: Diagnosis not present

## 2021-07-14 DIAGNOSIS — N400 Enlarged prostate without lower urinary tract symptoms: Secondary | ICD-10-CM | POA: Diagnosis present

## 2021-07-14 DIAGNOSIS — R221 Localized swelling, mass and lump, neck: Secondary | ICD-10-CM | POA: Diagnosis not present

## 2021-07-14 DIAGNOSIS — I10 Essential (primary) hypertension: Secondary | ICD-10-CM | POA: Diagnosis present

## 2021-07-14 DIAGNOSIS — R590 Localized enlarged lymph nodes: Secondary | ICD-10-CM | POA: Diagnosis not present

## 2021-07-14 DIAGNOSIS — R7989 Other specified abnormal findings of blood chemistry: Secondary | ICD-10-CM

## 2021-07-14 DIAGNOSIS — K1120 Sialoadenitis, unspecified: Secondary | ICD-10-CM | POA: Diagnosis present

## 2021-07-14 DIAGNOSIS — R0602 Shortness of breath: Secondary | ICD-10-CM | POA: Diagnosis not present

## 2021-07-14 DIAGNOSIS — Z4682 Encounter for fitting and adjustment of non-vascular catheter: Secondary | ICD-10-CM | POA: Diagnosis not present

## 2021-07-14 DIAGNOSIS — H9190 Unspecified hearing loss, unspecified ear: Secondary | ICD-10-CM | POA: Diagnosis not present

## 2021-07-14 DIAGNOSIS — K1121 Acute sialoadenitis: Secondary | ICD-10-CM | POA: Diagnosis not present

## 2021-07-14 DIAGNOSIS — C6211 Malignant neoplasm of descended right testis: Secondary | ICD-10-CM | POA: Diagnosis not present

## 2021-07-14 LAB — URINALYSIS, ROUTINE W REFLEX MICROSCOPIC
Bilirubin Urine: NEGATIVE
Glucose, UA: NEGATIVE mg/dL
Hgb urine dipstick: NEGATIVE
Ketones, ur: NEGATIVE mg/dL
Leukocytes,Ua: NEGATIVE
Nitrite: NEGATIVE
Protein, ur: 30 mg/dL — AB
Specific Gravity, Urine: 1.023 (ref 1.005–1.030)
pH: 7 (ref 5.0–8.0)

## 2021-07-14 LAB — CBC WITH DIFFERENTIAL/PLATELET
Abs Immature Granulocytes: 0.04 10*3/uL (ref 0.00–0.07)
Basophils Absolute: 0.1 10*3/uL (ref 0.0–0.1)
Basophils Relative: 0 %
Eosinophils Absolute: 0 10*3/uL (ref 0.0–0.5)
Eosinophils Relative: 0 %
HCT: 44.5 % (ref 39.0–52.0)
Hemoglobin: 14.5 g/dL (ref 13.0–17.0)
Immature Granulocytes: 0 %
Lymphocytes Relative: 12 %
Lymphs Abs: 1.4 10*3/uL (ref 0.7–4.0)
MCH: 33.4 pg (ref 26.0–34.0)
MCHC: 32.6 g/dL (ref 30.0–36.0)
MCV: 102.5 fL — ABNORMAL HIGH (ref 80.0–100.0)
Monocytes Absolute: 1.4 10*3/uL — ABNORMAL HIGH (ref 0.1–1.0)
Monocytes Relative: 12 %
Neutro Abs: 8.7 10*3/uL — ABNORMAL HIGH (ref 1.7–7.7)
Neutrophils Relative %: 76 %
Platelets: 244 10*3/uL (ref 150–400)
RBC: 4.34 MIL/uL (ref 4.22–5.81)
RDW: 13 % (ref 11.5–15.5)
WBC: 11.6 10*3/uL — ABNORMAL HIGH (ref 4.0–10.5)
nRBC: 0 % (ref 0.0–0.2)

## 2021-07-14 LAB — C-REACTIVE PROTEIN: CRP: 5.5 mg/dL — ABNORMAL HIGH (ref <1.0)

## 2021-07-14 LAB — POCT I-STAT 7, (LYTES, BLD GAS, ICA,H+H)
Acid-Base Excess: 1 mmol/L (ref 0.0–2.0)
Bicarbonate: 24.8 mmol/L (ref 20.0–28.0)
Calcium, Ion: 1.28 mmol/L (ref 1.15–1.40)
HCT: 37 % — ABNORMAL LOW (ref 39.0–52.0)
Hemoglobin: 12.6 g/dL — ABNORMAL LOW (ref 13.0–17.0)
O2 Saturation: 100 %
Patient temperature: 38.1
Potassium: 4.1 mmol/L (ref 3.5–5.1)
Sodium: 136 mmol/L (ref 135–145)
TCO2: 26 mmol/L (ref 22–32)
pCO2 arterial: 39.4 mmHg (ref 32–48)
pH, Arterial: 7.412 (ref 7.35–7.45)
pO2, Arterial: 284 mmHg — ABNORMAL HIGH (ref 83–108)

## 2021-07-14 LAB — PROTIME-INR
INR: 1 (ref 0.8–1.2)
Prothrombin Time: 12.6 seconds (ref 11.4–15.2)

## 2021-07-14 LAB — LACTIC ACID, PLASMA
Lactic Acid, Venous: 2.5 mmol/L (ref 0.5–1.9)
Lactic Acid, Venous: 1.6 mmol/L (ref 0.5–1.9)

## 2021-07-14 LAB — GLUCOSE, CAPILLARY
Glucose-Capillary: 125 mg/dL — ABNORMAL HIGH (ref 70–99)
Glucose-Capillary: 133 mg/dL — ABNORMAL HIGH (ref 70–99)
Glucose-Capillary: 145 mg/dL — ABNORMAL HIGH (ref 70–99)
Glucose-Capillary: 137 mg/dL — ABNORMAL HIGH (ref 70–99)

## 2021-07-14 LAB — COMPREHENSIVE METABOLIC PANEL
ALT: 12 U/L (ref 0–44)
AST: 14 U/L — ABNORMAL LOW (ref 15–41)
Albumin: 4.3 g/dL (ref 3.5–5.0)
Alkaline Phosphatase: 62 U/L (ref 38–126)
Anion gap: 6 (ref 5–15)
BUN: 8 mg/dL (ref 8–23)
CO2: 25 mmol/L (ref 22–32)
Calcium: 9.8 mg/dL (ref 8.9–10.3)
Chloride: 105 mmol/L (ref 98–111)
Creatinine, Ser: 0.86 mg/dL (ref 0.61–1.24)
GFR, Estimated: >60 mL/min (ref >60)
Glucose, Bld: 126 mg/dL — ABNORMAL HIGH (ref 70–99)
Potassium: 3.9 mmol/L (ref 3.5–5.1)
Sodium: 136 mmol/L (ref 135–145)
Total Bilirubin: 0.8 mg/dL (ref 0.3–1.2)
Total Protein: 8.5 g/dL — ABNORMAL HIGH (ref 6.5–8.1)

## 2021-07-14 LAB — SEDIMENTATION RATE: Sed Rate: 33 mm/hr — ABNORMAL HIGH (ref 0–16)

## 2021-07-14 LAB — MRSA NEXT GEN BY PCR, NASAL: MRSA by PCR Next Gen: NOT DETECTED

## 2021-07-14 MED ORDER — CHLORHEXIDINE GLUCONATE CLOTH 2 % EX PADS
6.0000 | MEDICATED_PAD | Freq: Every day | CUTANEOUS | Status: DC
  Administered 2021-07-15 – 2021-07-19 (×5): 6 via TOPICAL

## 2021-07-14 MED ORDER — PIPERACILLIN-TAZOBACTAM 3.375 G IVPB
3.3750 g | Freq: Three times a day (TID) | INTRAVENOUS | Status: DC
  Administered 2021-07-14 – 2021-07-19 (×15): 3.375 g via INTRAVENOUS
  Filled 2021-07-14 (×17): qty 50

## 2021-07-14 MED ORDER — LACTATED RINGERS IV BOLUS
1000.0000 mL | Freq: Once | INTRAVENOUS | Status: AC
  Administered 2021-07-14: 1000 mL via INTRAVENOUS

## 2021-07-14 MED ORDER — ORAL CARE MOUTH RINSE: 15.0000 mL | OROMUCOSAL | Status: DC | PRN

## 2021-07-14 MED ORDER — FENTANYL CITRATE PF 50 MCG/ML IJ SOSY
50.0000 ug | PREFILLED_SYRINGE | Freq: Once | INTRAMUSCULAR | Status: AC
  Administered 2021-07-14: 50 ug via INTRAVENOUS
  Filled 2021-07-14: qty 1

## 2021-07-14 MED ORDER — DEXMEDETOMIDINE HCL IN NACL 400 MCG/100ML IV SOLN
0.4000 ug/kg/h | INTRAVENOUS | Status: DC
  Administered 2021-07-14 – 2021-07-15 (×2): 0.4 ug/kg/h via INTRAVENOUS
  Filled 2021-07-14 (×2): qty 100

## 2021-07-14 MED ORDER — METHYLPREDNISOLONE SODIUM SUCC 125 MG IJ SOLR: 125.0000 mg | Freq: Once | INTRAMUSCULAR | Status: DC

## 2021-07-14 MED ORDER — LACTATED RINGERS IV SOLN: INTRAVENOUS | Status: DC

## 2021-07-14 MED ORDER — SODIUM CHLORIDE 0.9 % IV SOLN
2.0000 g | Freq: Once | INTRAVENOUS | Status: AC
  Administered 2021-07-14: 2 g via INTRAVENOUS
  Filled 2021-07-14: qty 12.5

## 2021-07-14 MED ORDER — ORAL CARE MOUTH RINSE
15.0000 mL | OROMUCOSAL | Status: DC
  Administered 2021-07-14 – 2021-07-17 (×35): 15 mL via OROMUCOSAL

## 2021-07-14 MED ORDER — ETOMIDATE 2 MG/ML IV SOLN
0.3000 mg/kg | Freq: Once | INTRAVENOUS | Status: AC
  Administered 2021-07-14: 25.8 mg via INTRAVENOUS
  Filled 2021-07-14: qty 20

## 2021-07-14 MED ORDER — MIDAZOLAM HCL 2 MG/2ML IJ SOLN
1.0000 mg | INTRAMUSCULAR | Status: DC | PRN
  Administered 2021-07-14 – 2021-07-15 (×2): 1 mg via INTRAVENOUS
  Filled 2021-07-14 (×5): qty 2

## 2021-07-14 MED ORDER — BISACODYL 10 MG RE SUPP: 10.0000 mg | Freq: Every day | RECTAL | Status: DC | PRN

## 2021-07-14 MED ORDER — DEXAMETHASONE SODIUM PHOSPHATE 10 MG/ML IJ SOLN
8.0000 mg | Freq: Three times a day (TID) | INTRAMUSCULAR | Status: DC
  Administered 2021-07-14 – 2021-07-16 (×6): 8 mg via INTRAVENOUS
  Filled 2021-07-14 (×5): qty 0.8
  Filled 2021-07-14: qty 1
  Filled 2021-07-14 (×2): qty 0.8

## 2021-07-14 MED ORDER — PANTOPRAZOLE SODIUM 40 MG IV SOLR: 40.0000 mg | Freq: Every day | INTRAVENOUS | Status: DC

## 2021-07-14 MED ORDER — ENOXAPARIN SODIUM 40 MG/0.4ML IJ SOSY
40.0000 mg | PREFILLED_SYRINGE | INTRAMUSCULAR | Status: DC
  Administered 2021-07-14 – 2021-07-22 (×9): 40 mg via SUBCUTANEOUS
  Filled 2021-07-14 (×9): qty 0.4

## 2021-07-14 MED ORDER — VANCOMYCIN HCL 1500 MG/300ML IV SOLN
1500.0000 mg | INTRAVENOUS | Status: DC
  Administered 2021-07-15: 1500 mg via INTRAVENOUS
  Filled 2021-07-14: qty 300

## 2021-07-14 MED ORDER — PROPOFOL 1000 MG/100ML IV EMUL
5.0000 ug/kg/min | INTRAVENOUS | Status: DC
  Administered 2021-07-14: 5 ug/kg/min via INTRAVENOUS
  Filled 2021-07-14 (×2): qty 100

## 2021-07-14 MED ORDER — IOHEXOL 300 MG/ML  SOLN
75.0000 mL | Freq: Once | INTRAMUSCULAR | Status: AC | PRN
  Administered 2021-07-14: 75 mL via INTRAVENOUS

## 2021-07-14 MED ORDER — METRONIDAZOLE 500 MG/100ML IV SOLN
500.0000 mg | Freq: Once | INTRAVENOUS | Status: AC
  Administered 2021-07-14: 500 mg via INTRAVENOUS
  Filled 2021-07-14: qty 100

## 2021-07-14 MED ORDER — LACTATED RINGERS IV BOLUS (SEPSIS)
1000.0000 mL | Freq: Once | INTRAVENOUS | Status: AC
  Administered 2021-07-14: 1000 mL via INTRAVENOUS

## 2021-07-14 MED ORDER — PANTOPRAZOLE SODIUM 40 MG IV SOLR
40.0000 mg | Freq: Every day | INTRAVENOUS | Status: DC
  Administered 2021-07-14 – 2021-07-16 (×3): 40 mg via INTRAVENOUS
  Filled 2021-07-14 (×3): qty 10

## 2021-07-14 MED ORDER — VANCOMYCIN HCL IN DEXTROSE 1-5 GM/200ML-% IV SOLN
1000.0000 mg | Freq: Once | INTRAVENOUS | Status: DC
  Filled 2021-07-14: qty 200

## 2021-07-14 MED ORDER — MIDAZOLAM HCL 2 MG/2ML IJ SOLN
4.0000 mg | Freq: Once | INTRAMUSCULAR | Status: AC
  Administered 2021-07-14: 4 mg via INTRAVENOUS
  Filled 2021-07-14: qty 4

## 2021-07-14 MED ORDER — FENTANYL 2500MCG IN NS 250ML (10MCG/ML) PREMIX INFUSION
0.0000 ug/h | INTRAVENOUS | Status: DC
  Administered 2021-07-14 – 2021-07-15 (×2): 100 ug/h via INTRAVENOUS
  Administered 2021-07-16: 200 ug/h via INTRAVENOUS
  Filled 2021-07-14 (×4): qty 250

## 2021-07-14 MED ORDER — PANTOPRAZOLE 2 MG/ML SUSPENSION
40.0000 mg | Freq: Every day | ORAL | Status: DC
  Filled 2021-07-14: qty 20

## 2021-07-14 MED ORDER — PROPOFOL 1000 MG/100ML IV EMUL
0.0000 ug/kg/min | INTRAVENOUS | Status: DC
  Administered 2021-07-14: 10 ug/kg/min via INTRAVENOUS
  Filled 2021-07-14: qty 100

## 2021-07-14 MED ORDER — MIDAZOLAM-SODIUM CHLORIDE 100-0.9 MG/100ML-% IV SOLN: 0.5000 mg/h | INTRAVENOUS | Status: DC

## 2021-07-14 MED ORDER — INSULIN ASPART 100 UNIT/ML IJ SOLN
0.0000 [IU] | INTRAMUSCULAR | Status: DC
  Administered 2021-07-14 (×3): 1 [IU] via SUBCUTANEOUS
  Administered 2021-07-15: 2 [IU] via SUBCUTANEOUS
  Administered 2021-07-15 (×3): 1 [IU] via SUBCUTANEOUS
  Administered 2021-07-16: 2 [IU] via SUBCUTANEOUS
  Administered 2021-07-16 – 2021-07-18 (×11): 1 [IU] via SUBCUTANEOUS

## 2021-07-14 MED ORDER — SUCCINYLCHOLINE CHLORIDE 200 MG/10ML IV SOSY
1.5000 mg/kg | PREFILLED_SYRINGE | Freq: Once | INTRAVENOUS | Status: AC
  Administered 2021-07-14: 129 mg via INTRAVENOUS
  Filled 2021-07-14: qty 10

## 2021-07-14 MED ORDER — FENTANYL CITRATE (PF) 100 MCG/2ML IJ SOLN
150.0000 ug | Freq: Once | INTRAMUSCULAR | Status: AC
  Administered 2021-07-14: 150 ug via INTRAVENOUS
  Filled 2021-07-14: qty 4

## 2021-07-14 MED ORDER — ACETAMINOPHEN 650 MG RE SUPP: 650.0000 mg | Freq: Once | RECTAL | Status: DC

## 2021-07-14 MED ORDER — VANCOMYCIN HCL 1500 MG/300ML IV SOLN
1500.0000 mg | Freq: Once | INTRAVENOUS | Status: AC
  Administered 2021-07-14: 1500 mg via INTRAVENOUS
  Filled 2021-07-14: qty 300

## 2021-07-14 MED ORDER — PANTOPRAZOLE 2 MG/ML SUSPENSION
40.0000 mg | Freq: Every day | ORAL | Status: DC
Start: 2021-07-14 — End: 2021-07-23
  Administered 2021-07-17 – 2021-07-23 (×7): 40 mg
  Filled 2021-07-14 (×10): qty 20

## 2021-07-14 MED ORDER — PROPOFOL 10 MG/ML IV BOLUS
0.5000 mg/kg | Freq: Once | INTRAVENOUS | Status: AC
  Administered 2021-07-14: 42 mg via INTRAVENOUS
  Filled 2021-07-14: qty 20

## 2021-07-14 MED ORDER — SODIUM CHLORIDE 0.9 % IV SOLN: 2.0000 g | Freq: Three times a day (TID) | INTRAVENOUS | Status: DC

## 2021-07-14 NOTE — Consult Note (Signed)
Urology Consult Note   Requesting Attending Physician:  Candee Furbish, MD Service Providing Consult: Urology  Consulting Attending: Dr. Cain Sieve   Reason for Consult:  Poorly draining catheter  HPI: Nathaniel Hicks is seen in consultation for reasons noted above at the request of Candee Furbish, MD for evaluation of poorly draining catheter.  This is a 81 y.o. male currently admitted for left sided jaw swelling with associated voice changes and dysphagia. Admitted for concern for possible sepsis, intubated at OSH and subsequently transferred to [facility].  There was concern that his foley catheter was poorly draining. He has a history of BPH for which he was seen in April 2023, currently taking silodosin, getting routine PSA checks. Also has a history of testicular cancer (stage 1b seminoma) s/p orchiectomy in 2014.   Past Medical History: Past Medical History:  Diagnosis Date   Arthritis    BPH (benign prostatic hyperplasia)    HOH (hard of hearing)    Hyperlipidemia 11/02/2017   Hypertension    Hypertension 11/02/2017   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[place])    2014   Vitamin D deficiency 11/02/2017    Past Surgical History:  Past Surgical History:  Procedure Laterality Date   COLONOSCOPY N/A 11/24/2012   Procedure: COLONOSCOPY;  Surgeon: Rogene Houston, MD;  [facility];  Service: Endoscopy;  Laterality: N/A;  830-moved to [place] notified pt   HEMORROIDECTOMY     KNEE ARTHROSCOPY WITH LATERAL MENISECTOMY Right 08/31/2017   Procedure: KNEE ARTHROSCOPY WITH LATERAL MENISECTOMY;  Surgeon: Carole Civil, MD;  Location: [facility];  Service: Orthopedics;  Laterality: Right;   LESION EXCISION N/A 03/23/2012   Procedure: EXCISION NEOPLASM SCALP ;  Surgeon: Jamesetta So, MD;  Location: [facility];  Service: General;  Laterality: N/A;  Excision of Scalp Neoplasm   ORCHIECTOMY Right 12/20/2012   Procedure: RIGHT RADICAL ORCHIECTOMY/POSSIBLE BX RIGHT TESTICLE;  Surgeon: Marissa Nestle, MD;  Location: [facility];  Service: Urology;  Laterality: Right;   PROSTATE SURGERY     SCALP LACERATION REPAIR     [facility]-Dr Tamala Julian    Medication: Current Facility-Administered Medications  Medication Dose Route Frequency Provider Last Rate Last Admin   acetaminophen (TYLENOL) suppository 650 mg  650 mg Rectal Once Godfrey Pick, MD       bisacodyl (DULCOLAX) suppository 10 mg  10 mg Rectal Daily PRN Jennelle Human B, NP       dexamethasone (DECADRON) injection 8 mg  8 mg Intravenous Q8H Godfrey Pick, MD   8 mg at 07/14/21 1039   dexmedetomidine (PRECEDEX) 400 MCG/100ML (4 mcg/mL) infusion  0.4-1.2 mcg/kg/hr Intravenous Titrated Jennelle Human B, NP       enoxaparin (LOVENOX) injection 40 mg  40 mg Subcutaneous Q24H Hunsucker, Bonna Gains, MD       fentaNYL 2544mg in NS 2569m(1033mml) infusion-PREMIX  0-200 mcg/hr Intravenous Continuous SimJennelle Human NP 10 mL/hr at 07/14/21 1232 100 mcg/hr at 07/14/21 1232   insulin aspart (novoLOG) injection 0-9 Units  0-9 Units Subcutaneous Q4H SimJennelle Human NP       midazolam (VERSED) injection 1 mg  1 mg Intravenous Q2H PRN SimArnell AsalP       Oral care mouth rinse  15 mL Mouth Rinse Q2H SimJennelle Human NP       Oral care mouth rinse  15 mL Mouth Rinse PRN SimJennelle Human NP       pantoprazole sodium (PROTONIX) 40  mg/20 mL oral suspension 40 mg  40 mg Per Tube Daily Candee Furbish, MD       Or   pantoprazole (PROTONIX) injection 40 mg  40 mg Intravenous Daily Candee Furbish, MD       piperacillin-tazobactam (ZOSYN) IVPB 3.375 g  3.375 g Intravenous Q8H Jennelle Human B, NP       [START ON 07/15/2021] vancomycin (VANCOREADY) IVPB 1500 mg/300 mL  1,500 mg Intravenous Q24H Godfrey Pick, MD        Allergies: No Known Allergies  Social History: Social History   Tobacco Use   Smoking status: Every Day    Packs/day: 0.25    Years: 50.00    Total pack years: 12.50    Types: Cigarettes   Smokeless tobacco: Never  Vaping Use    Vaping Use: Never used  Substance Use Topics   Alcohol use: Not Currently   Drug use: No    Family History Family History  Problem Relation Age of Onset   Cancer Brother    Cancer Brother     Review of Systems 10 systems were reviewed and are negative except as noted specifically in the HPI.  Objective   Vital signs in last 24 hours: BP (!) 152/116   Pulse (!) 136   Temp 99.9 F (37.7 C) (Oral)   Resp (!) 25   Ht '5\' 11"'$  (1.803 m)   Wt 83.9 kg   SpO2 100%   BMI 25.80 kg/m   Physical Exam General: Intubated/sedated. Arousable HEENT: ET tube in place, left submandibular swelling Pulmonary: symmetric chest rise Cardiovascular: HDS, adequate peripheral perfusion Abdomen: Soft, mild abdominal distention. GU: foley catheter in place, draining clear yellow urine Extremities: warm and well perfused Neuro: Appropriate, no focal neurological deficits  Most Recent Labs: Lab Results  Component Value Date   WBC 11.6 (H) 07/14/2021   HGB 14.5 07/14/2021   HCT 44.5 07/14/2021   PLT 244 07/14/2021    Lab Results  Component Value Date   NA 136 07/14/2021   K 3.9 07/14/2021   CL 105 07/14/2021   CO2 25 07/14/2021   BUN 8 07/14/2021   CREATININE 0.86 07/14/2021   CALCIUM 9.8 07/14/2021   MG 1.7 08/10/2009    Lab Results  Component Value Date   INR 1.0 07/14/2021     Urine Culture: '@[id number]'$ (laburin,org,[id number],[id number])@   IMAGING: DG Chest Port 1 View  Result Date: 07/14/2021 CLINICAL DATA:  Respiratory failure. EXAM: PORTABLE CHEST 1 VIEW COMPARISON:  07/14/2021 FINDINGS: 1440 hours. Endotracheal tube tip is approximately 3.3 cm above the base of the carina. Low volume film with left base atelectasis. The cardio pericardial silhouette is enlarged. No airspace pulmonary edema focal lung consolidation. No substantial pleural effusion. Telemetry leads overlie the chest. IMPRESSION: 1. Endotracheal tube tip is 3.3 cm above the base of the carina. 2. Low volume film with  left base atelectasis. Electronically Signed   By: Misty Stanley M.D.   On: 07/14/2021 14:49   DG Chest Port 1V same Day  Result Date: 07/14/2021 CLINICAL DATA:  Intubation. EXAM: PORTABLE CHEST 1 VIEW COMPARISON:  Chest x-ray from same day at 0742 hours. FINDINGS: The patient is rotated to the left. New endotracheal tube with tip 3.8 cm above the carina. Unchanged mild cardiomegaly. No focal consolidation, pleural effusion, or pneumothorax. No acute osseous abnormality. IMPRESSION: 1. Appropriately positioned endotracheal tube. 2. No active disease. Electronically Signed   By: Titus Dubin M.D.   On: 07/14/2021 12:41  CT Soft Tissue Neck W Contrast  Result Date: 07/14/2021 CLINICAL DATA:  Neck mass, nonpulsatile; left jaw pain and swelling EXAM: CT NECK WITH CONTRAST TECHNIQUE: Multidetector CT imaging of the neck was performed using the standard protocol following the bolus administration of intravenous contrast. RADIATION DOSE REDUCTION: This exam was performed according to the departmental dose-optimization program which includes automated exposure control, adjustment of the mA and/or kV according to patient size and/or use of iterative reconstruction technique. CONTRAST:  56m OMNIPAQUE IOHEXOL 300 MG/ML  SOLN COMPARISON:  None Available. FINDINGS: Motion artifact is present. Pharynx and larynx: Oropharyngeal wall thickening eccentric to the left with effacement of the left vallecular. Infiltration of the left parapharyngeal fat. Probable involvement of the left floor of mouth. Asymmetric thickening of the left aryepiglottic fold with effacement of the left piriform sinus. Larynx is unremarkable though there is motion artifact through this region. Salivary glands: Enlargement of the left submandibular gland. Parotids and the right submandibular gland are unremarkable. Thyroid: Normal. Lymph nodes: Asymmetric enlarged right submandibular node measuring 1.6 cm. Vascular: Major neck vessels are patent.  Calcified plaque at the ICA origins. Limited intracranial: No abnormal enhancement. Visualized orbits: Bilateral lens replacements. Otherwise unremarkable. Mastoids and visualized paranasal sinuses: No significant opacification. Skeleton: Advanced degenerative changes of the spine. Upper chest: No apical lung mass. Other: Soft tissue swelling with edema and fascial thickening in the left submandibular region. Right suboccipital posterior neck lipoma. IMPRESSION: Suboptimal evaluation due to motion degradation. Soft tissue swelling in the left submandibular region, oropharynx including tongue base, probable extension into the floor of mouth, and supraglottic larynx. Enlarged contralateral right submandibular node. Enlargement of the left submandibular gland is probably reactive. No abscess. A neoplastic etiology is not entirely excluded given reported length of symptoms, but the appearance favors inflammatory etiology. Electronically Signed   By: PMacy MisM.D.   On: 07/14/2021 09:43   DG Chest Port 1 View  Result Date: 07/14/2021 CLINICAL DATA:  Questionable sepsis - evaluate for abnormality EXAM: PORTABLE CHEST 1 VIEW COMPARISON:  01/10/2021 FINDINGS: Unchanged mildly enlarged cardiac silhouette. There is no focal airspace consolidation. There is no pleural effusion. There is no pneumothorax. There is no acute osseous abnormality. Bilateral shoulder degenerative changes. IMPRESSION: No focal airspace consolidation.  Unchanged cardiomegaly. Electronically Signed   By: JMaurine SimmeringM.D.   On: 07/14/2021 07:51    ------  Assessment:  81y.o. male admitted with for possible left mandibular swelling of unclear etiology. Has a history of BPH. Catheter in place and draining well.   Recommendations: - No acute urologic intervention indicated - Foley catheter per primary team - Follow-up as needed with outside urologist ([place]   Thank you for this consult. Please contact  the urology consult pager with any further questions/concerns.

## 2021-07-14 NOTE — Progress Notes (Signed)
[facility] Progress Note Patient Name: Nathaniel Hicks DOB: Mar 18, 1940 MRN: [id number]   Date of Service  07/14/2021  HPI/Events of Note  Notified of agitation.  Pt is intubated and sedated on fentanyl.  Pt was taken off precedex earlier.  Pt was reportedly bradycardic and versed was also discontinued.    Pt woke up and was reaching for the tube and needed 2 nurses to hold him down.  Pt given versed bolus.  SBP in the 120s, HR 70s.   eICU Interventions  Start on propofol gtt and continue fentanyl gtt.  Wrist restraints ordered.     Intervention Category Intermediate Interventions: Change in mental status - evaluation and management  Elsie Lincoln 07/14/2021, 8:56 PM

## 2021-07-14 NOTE — Progress Notes (Signed)
Pharmacy Antibiotic Note  Nathaniel Hicks is a 81 y.o. male admitted on 07/14/2021 with  unknown source of infection .  Pharmacy has been consulted for vancomycin and cefepime dosing.  Plan: Vancomycin 1500 mg IV every 24 hours. Cefepime 2000 mg IV every 8 hours. Monitor labs, c/s, and vanco level as indicated.  Height: '5\' 11"'$  (180.3 cm) Weight: 83.9 kg (185 lb) IBW/kg (Calculated) : 75.3  Temp (24hrs), Avg:99.9 F (37.7 C), Min:99.9 F (37.7 C), Max:99.9 F (37.7 C)  Recent Labs  Lab 07/14/21 0727 07/14/21 0846 07/14/21 1121  WBC 11.6*  --   --   CREATININE 0.86  --   --   LATICACIDVEN  --  2.5* 1.6    Estimated Creatinine Clearance: 73 mL/min (by C-G formula based on SCr of 0.86 mg/dL).    No Known Allergies  Antimicrobials this admission: Vanco 7/3  >>  Cefepime 7/3 >>  Microbiology results: 7/3 BCx: pending   Thank you for allowing pharmacy to be a part of this patient's care.  Ramond Craver 07/14/2021 12:36 PM

## 2021-07-14 NOTE — ED Triage Notes (Signed)
Pt reports left sided jaw swelling x 1 month. Denies fevers. Reports trouble with talking and swallowing.

## 2021-07-14 NOTE — ED Provider Notes (Signed)
[facility] EMERGENCY DEPARTMENT Provider Note   CSN: [id number] Arrival date & time: 07/14/21  2585     History {Add pertinent medical, surgical, social history, OB history to HPI:1} Chief Complaint  Patient presents with   Facial Swelling    Nathaniel Hicks is a 81 y.o. male.  HPI Patient presents for left-sided facial swelling and pain.  He states that he has had odontogenic pain over the past month.  It began on the right side and migrated to the left side.  Since yesterday, he has experienced a swelling in the lower left side of his face.  He does have associated pain and this pain is worsened with swallowing.  He endorses voice change over the past 24 hours.  Due to the pain, he has not been able to eat or drink.  He denies any systemic symptoms.  Medical history includes HTN, HLD, arthritis, BPH, testicular cancer s/p right radical orchiectomy.    Home Medications Prior to Admission medications   Medication Sig Start Date End Date Taking? Authorizing Provider  amLODipine-benazepril (LOTREL) 10-20 MG per capsule 1 capsule daily.  12/12/12   [provider]  Cholecalciferol (VITAMIN D3) 50000 units TABS Take 5,000 Units by mouth daily.    [provider]  gabapentin (NEURONTIN) 300 MG capsule Take 300 mg by mouth 2 (two) times daily.    [provider]  magnesium oxide (MAG-OX) 400 MG tablet Take 400 mg by mouth daily.    [provider]  meloxicam (MOBIC) 15 MG tablet Take 15 mg by mouth daily. 07/18/20   [provider]  Multiple Vitamins-Minerals (MULTIVITAMIN WITH MINERALS) tablet Take 1 tablet by mouth daily.    [provider]  naproxen (NAPROSYN) 500 MG tablet Take 500 mg by mouth 2 (two) times daily.  10/21/12   [provider]  ondansetron (ZOFRAN) 4 MG tablet Take 1 tablet (4 mg total) by mouth every 6 (six) hours. 01/10/21   Marcello Fennel, PA-C  oseltamivir (TAMIFLU) 75 MG capsule Take 1 capsule (75 mg  total) by mouth every 12 (twelve) hours. 01/10/21   Marcello Fennel, PA-C  oxyCODONE (OXY IR/ROXICODONE) 5 MG immediate release tablet Take 5 mg by mouth 3 (three) times daily as needed for pain. 07/13/17   [provider]  pravastatin (PRAVACHOL) 40 MG tablet Take 40 mg by mouth daily.    [provider]  RAPAFLO 8 MG CAPS capsule Take 8 mg by mouth daily.  10/13/13   [provider]      Allergies    Patient has no known allergies.    Review of Systems   Review of Systems  HENT:  Positive for dental problem, facial swelling, sore throat and trouble swallowing.   All other systems reviewed and are negative.   Physical Exam Updated Vital Signs BP (!) 174/81 (BP Location: Right Arm)   Pulse (!) 112   Temp 99.9 F (37.7 C) (Oral)   Resp 14   SpO2 96%  Physical Exam Vitals and nursing note reviewed.  Constitutional:      General: He is not in acute distress.    Appearance: He is well-developed. He is not ill-appearing, toxic-appearing or diaphoretic.  HENT:     Head: Normocephalic and atraumatic.     Right Ear: External ear normal.     Left Ear: External ear normal.     Nose: Nose normal.     Mouth/Throat:     Mouth: Mucous membranes are  moist.     Comments: Firm swelling in area under left mandible.  Firm edema and sublingual area, greater on the left.  Hot potato voice. Eyes:     Extraocular Movements: Extraocular movements intact.     Conjunctiva/sclera: Conjunctivae normal.  Cardiovascular:     Rate and Rhythm: Normal rate and regular rhythm.     Heart sounds: No murmur heard. Pulmonary:     Effort: Pulmonary effort is normal. No respiratory distress.     Breath sounds: Normal breath sounds. No stridor. No wheezing or rales.  Abdominal:     Palpations: Abdomen is soft.     Tenderness: There is no abdominal tenderness.  Musculoskeletal:        General: No swelling. Normal range of motion.     Cervical back: Normal range of motion and neck  supple.  Skin:    General: Skin is warm and dry.     Coloration: Skin is not jaundiced or pale.  Neurological:     General: No focal deficit present.     Mental Status: He is alert and oriented to person, place, and time.     Cranial Nerves: No cranial nerve deficit.     Sensory: No sensory deficit.     Motor: No weakness.     Coordination: Coordination normal.  Psychiatric:        Mood and Affect: Mood normal.        Behavior: Behavior normal.        Thought Content: Thought content normal.     ED Results / Procedures / Treatments   Labs (all labs ordered are listed, but only abnormal results are displayed) Labs Reviewed - No data to display  EKG None  Radiology No results found.  Procedures Date/Time: 07/14/2021 12:21 PM  Performed by: Godfrey Pick, MDPre-anesthesia Checklist: Emergency Drugs available, Suction available, Patient being monitored and Timeout performed Oxygen Delivery Method: Ambu bag Preoxygenation: Pre-oxygenation with 100% oxygen Induction Type: Rapid sequence Ventilation: Mask ventilation without difficulty Laryngoscope Size: Glidescope and 4 Grade View: Grade I Tube size: 7.0 mm Number of attempts: 2 (BVM ventilations in between.  Had to downsize 7.5 mm tube to 7.0 due to swelling posterior to glottis.) Airway Equipment and Method: Rigid stylet and Video-laryngoscopy Placement Confirmation: ETT inserted through vocal cords under direct vision, Positive ETCO2, CO2 detector and Breath sounds checked- equal and bilateral Secured at: 25 cm Tube secured with: ETT holder Dental Injury: Teeth and Oropharynx as per pre-operative assessment  Difficulty Due To: Difficulty was anticipated      {Document cardiac monitor, telemetry assessment procedure when appropriate:1}  Medications Ordered in ED Medications - No data to display  ED Course/ Medical Decision Making/ A&P                           Medical Decision Making Amount and/or Complexity of  Data Reviewed Labs: ordered. Radiology: ordered. ECG/medicine tests: ordered.  Risk OTC drugs. Prescription drug management. Decision regarding hospitalization.   ***  {Document critical care time when appropriate:1} {Document review of labs and clinical decision tools ie heart score, Chads2Vasc2 etc:1}  {Document your independent review of radiology images, and any outside records:1} {Document your discussion with family members, caretakers, and with consultants:1} {Document social determinants of health affecting pt's care:1} {Document your decision making why or why not admission, treatments were needed:1} Final Clinical Impression(s) / ED Diagnoses Final diagnoses:  None    Rx / DC Orders  ED Discharge Orders     None

## 2021-07-14 NOTE — Progress Notes (Addendum)
Spoke with Dr. Doren Custard and reviewed case/imaging.  Given rapidity of onset (acutely worse in 24h), probably safest just to secure airway while its easier and allow abx to have time to work.  We will accept to [facility] as may need ENT eval at some point.  Erskine Emery MD PCCM

## 2021-07-14 NOTE — H&P (Addendum)
NAME:  Nathaniel Hicks, MRN:  [id number], DOB:  1940-11-16, LOS: 0 ADMISSION DATE:  07/14/2021, CONSULTATION DATE:  07/14/2021 REFERRING MD:  Dr. Doren Custard, CHIEF COMPLAINT:  facial swelling   History of Present Illness:  HPI obtained from medical chart review as patient is intubated and sedated on mechanical ventilation.   81 year old male with PMH as below who presented to [facility] ER with left side jaw swelling and odontogenic pain for one month with voice changes, dysphagia, and poor PO intake over the last 24hrs.   In ER, temp 99.9, tachycardic, normotensive and normoxia.  Was noted to have hot-potato voice and firm swelling under left mandible and sublingual areas.  Labs noted for WBC 11.6, CRP 5.5, sed rate 33, lactic 2.5.  CXR neg.  CT soft tissues noted for soft tissue swelling in the left submandibular region, oropharynx including tongue base, supraglottic larynx, and with probable extension into the floor of the mouth.  Also noted enlarged contralateral right submandibular node and left submandibular gland; no abscess, and neoplastic etiology not excluded but appearance favors inflammatory etiology.  Cultures sent and started on cefepime, flagyl, and vancomycin.  Given concern for airway compromise, patient was electively intubated.  Patient to be transferred to [facility], PCCM accepting.   Pertinent  Medical History  Tobacco abuse, HTN, HLD, arthritis, BPH, testicular cancer s/p right radial orchiectomy, [facility]  Significant Hospital Events: Including procedures, antibiotic start and stop dates in addition to other pertinent events   Admitted/ transfer from [facility], intubated   Interim History / Subjective:   Objective   Blood pressure (!) 142/73, pulse (!) 125, temperature 99.9 F (37.7 C), temperature source Oral, resp. rate (!) 27, height '5\' 11"'$  (1.803 m), weight 83.9 kg, SpO2 93 %.        Intake/Output Summary (Last 24 hours) at 07/14/2021 1218 Last data filed at 07/14/2021 1139 Gross per 24 hour   Intake 2000 ml  Output --  Net 2000 ml   Filed Weights   07/14/21 1146  Weight: 83.9 kg   Examination: General:  critically ill older adult male intubated and sedated in NAD HEENT: MM pink/moist, ETT 7 at 25 lip, no OGT, pupils pinpoint, large firm edema in left submandibular space Neuro: sedated-> s/p bolus of fent/ versed with transport CV: rr, NSR PULM:  non labored, MV supported breaths, clear GI: soft, protuberant, bs+, foley- cyu Extremities: warm/dry, no LE edema  Skin: no rashes    Resolved Hospital Problem list    Assessment & Plan:   Left submandibular swelling  Sepsis   Hypotension- sedation related 2/2 propofol, since resolved - ? Etiology from odontogenic but can not rule out neoplasm.  Suspect infectious/ inflammatory given rapid progression of symptoms over the last 24hrs  - intubated in ER prior to transport given concern for possible airway compromise given rapid progression in symptoms - consult ENT>   Addendum: spoke with Dr. Fredric Dine with ENT who [facility] EDP spoke with and already reviewed imaging.  Nothing to do from surgical standpoint at this time.  Feels more infectious/ inflammatory and to treat as such, medical management for now.  If not improving, will need to rule out malignancy.  Recs to continue decadron '8mg'$  q 8hr x 3 doses after initial '10mg'$  and post extubation for good oral/ dental exam.   ENT available as needed.  - continue broad spectrum abx> zosyn, vanc  - follow blood cultures  - full MV support, PRVC> not considering extubation till swelling improves.  Ensure cuff leak prior to extubation.  - CXR and ABG now - PAD protocol with fentanyl gtt and change versed to precedex (if he tolerates)  for RASS goal -1/-2 with scheduled bowel regimen.   - CBGs q4, add SSI if > 180, likely exacerbated by steroids  - if not extubated tomorrow, consider OGT/ TF   Best Practice (right click and "Reselect all SmartList Selections" daily)   Diet/type:  NPO DVT prophylaxis: prophylactic heparin  GI prophylaxis: PPI Lines: N/A Foley:  N/A Code Status:  full code Last date of multidisciplinary goals of care discussion [pending]  Attempted to call NOK, sister Antony Blackbird on cell and home.  No answer.   Labs   CBC: Recent Labs  Lab 07/14/21 0727  WBC 11.6*  NEUTROABS 8.7*  HGB 14.5  HCT 44.5  MCV 102.5*  PLT 119    Basic Metabolic Panel: Recent Labs  Lab 07/14/21 0727  NA 136  K 3.9  CL 105  CO2 25  GLUCOSE 126*  BUN 8  CREATININE 0.86  CALCIUM 9.8   GFR: Estimated Creatinine Clearance: 73 mL/min (by C-G formula based on SCr of 0.86 mg/dL). Recent Labs  Lab 07/14/21 0727 07/14/21 0846 07/14/21 1121  WBC 11.6*  --   --   LATICACIDVEN  --  2.5* 1.6    Liver Function Tests: Recent Labs  Lab 07/14/21 0727  AST 14*  ALT 12  ALKPHOS 62  BILITOT 0.8  PROT 8.5*  ALBUMIN 4.3   No results for input(s): "LIPASE", "AMYLASE" in the last 168 hours. No results for input(s): "AMMONIA" in the last 168 hours.  ABG No results found for: "PHART", "PCO2ART", "PO2ART", "HCO3", "TCO2", "ACIDBASEDEF", "O2SAT"   Coagulation Profile: Recent Labs  Lab 07/14/21 0727  INR 1.0    Cardiac Enzymes: No results for input(s): "CKTOTAL", "CKMB", "CKMBINDEX", "TROPONINI" in the last 168 hours.  HbA1C: No results found for: "HGBA1C"  CBG: No results for input(s): "GLUCAP" in the last 168 hours.  Review of Systems:   Unable as patient is sedated and intubated on mechanical ventilation.   Past Medical History:  He,  has a past medical history of Arthritis, BPH (benign prostatic hyperplasia), HOH (hard of hearing), Hyperlipidemia (11/02/2017), Hypertension, Hypertension (11/02/2017), 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[place]), and Vitamin D deficiency (11/02/2017).   Surgical History:   Past Surgical History:  Procedure Laterality Date   COLONOSCOPY N/A 11/24/2012   Procedure: COLONOSCOPY;  Surgeon: Rogene Houston, MD;  [facility];  Service: Endoscopy;  Laterality: N/A;  830-moved to [place] notified pt   HEMORROIDECTOMY     KNEE ARTHROSCOPY WITH LATERAL MENISECTOMY Right 08/31/2017   Procedure: KNEE ARTHROSCOPY WITH LATERAL MENISECTOMY;  Surgeon: Carole Civil, MD;  Location: [facility] ORS;  Service: Orthopedics;  Laterality: Right;   LESION EXCISION N/A 03/23/2012   Procedure: EXCISION NEOPLASM SCALP ;  Surgeon: Jamesetta So, MD;  Location: [facility] ORS;  Service: General;  Laterality: N/A;  Excision of Scalp Neoplasm   ORCHIECTOMY Right 12/20/2012   Procedure: RIGHT RADICAL ORCHIECTOMY/POSSIBLE BX RIGHT TESTICLE;  Surgeon: Marissa Nestle, MD;  Location: [facility] ORS;  Service: Urology;  Laterality: Right;   PROSTATE SURGERY     SCALP LACERATION REPAIR     [facility]-Dr Tamala Julian     Social History:   reports that he has been smoking cigarettes. He has a 12.50 pack-year smoking history. He has never used smokeless tobacco. He reports that he does not currently use alcohol. He reports that  he does not use drugs.   Family History:  His family history includes Cancer in his brother and brother.   Allergies No Known Allergies   Home Medications  Prior to Admission medications   Medication Sig Start Date End Date Taking? Authorizing Provider  amLODipine-benazepril (LOTREL) 10-40 MG capsule Take 1 capsule by mouth daily. 05/07/21  Yes [provider]  Cholecalciferol (VITAMIN D3) 50000 units TABS Take 5,000 Units by mouth daily.   Yes [provider]  gabapentin (NEURONTIN) 300 MG capsule Take 300 mg by mouth 2 (two) times daily.   Yes [provider]  hydrALAZINE (APRESOLINE) 25 MG tablet Take 25 mg by mouth 2 (two) times daily. 05/08/21  Yes [provider]  magnesium oxide (MAG-OX) 400 MG tablet Take 400 mg by mouth daily.   Yes [provider]  oxyCODONE (OXY IR/ROXICODONE) 5 MG immediate release tablet Take 5 mg by mouth 3 (three) times daily as needed for pain. 07/13/17  Yes [provider]  pravastatin (PRAVACHOL) 40 MG tablet Take 80 mg by mouth daily.   Yes [provider]  RAPAFLO 8 MG CAPS capsule Take 8 mg by mouth daily.  10/13/13  Yes [provider]  vitamin B-12 (CYANOCOBALAMIN) 1000 MCG tablet Take 1,000 mcg by mouth daily.   Yes [provider]     Critical care time: 38 mins     Kennieth Rad, ACNP [facility] 07/14/2021, 2:42 PM  See Amion for pager If no response to pager, please call PCCM consult pager After 7:00 pm call Elink

## 2021-07-14 NOTE — Progress Notes (Signed)
Arrived p sedation for anticipated intubation/ transfer to [facility]  The oropharynx was difficult to viz but the epiglottis/ glottis clearly viz and cords moved normally  Assisted EDP with intubation/ confirmation of position by ETC02 detector and cxr which showed et in good position  Verified initial vent settings appropriate and care link in room for transfer shortly thereafter   Admit to  PCCM team at [facility] planned with ent f/u there if needed   Christinia Gully, MD [facility] [place] [phone]   After 7:00 pm call Elink  [phone]

## 2021-07-15 ENCOUNTER — Inpatient Hospital Stay (HOSPITAL_COMMUNITY): Payer: Medicare Other

## 2021-07-15 DIAGNOSIS — R220 Localized swelling, mass and lump, head: Secondary | ICD-10-CM | POA: Diagnosis not present

## 2021-07-15 LAB — BASIC METABOLIC PANEL
Anion gap: 9 (ref 5–15)
BUN: 13 mg/dL (ref 8–23)
CO2: 20 mmol/L — ABNORMAL LOW (ref 22–32)
Calcium: 9.3 mg/dL (ref 8.9–10.3)
Chloride: 108 mmol/L (ref 98–111)
Creatinine, Ser: 0.99 mg/dL (ref 0.61–1.24)
GFR, Estimated: >60 mL/min (ref >60)
Glucose, Bld: 138 mg/dL — ABNORMAL HIGH (ref 70–99)
Potassium: 3.8 mmol/L (ref 3.5–5.1)
Sodium: 137 mmol/L (ref 135–145)

## 2021-07-15 LAB — CBC
HCT: 38.2 % — ABNORMAL LOW (ref 39.0–52.0)
Hemoglobin: 12.5 g/dL — ABNORMAL LOW (ref 13.0–17.0)
MCH: 32.7 pg (ref 26.0–34.0)
MCHC: 32.7 g/dL (ref 30.0–36.0)
MCV: 100 fL (ref 80.0–100.0)
Platelets: 236 10*3/uL (ref 150–400)
RBC: 3.82 MIL/uL — ABNORMAL LOW (ref 4.22–5.81)
RDW: 12.9 % (ref 11.5–15.5)
WBC: 15 10*3/uL — ABNORMAL HIGH (ref 4.0–10.5)
nRBC: 0 % (ref 0.0–0.2)

## 2021-07-15 LAB — GLUCOSE, CAPILLARY
Glucose-Capillary: 111 mg/dL — ABNORMAL HIGH (ref 70–99)
Glucose-Capillary: 134 mg/dL — ABNORMAL HIGH (ref 70–99)
Glucose-Capillary: 137 mg/dL — ABNORMAL HIGH (ref 70–99)
Glucose-Capillary: 139 mg/dL — ABNORMAL HIGH (ref 70–99)
Glucose-Capillary: 142 mg/dL — ABNORMAL HIGH (ref 70–99)
Glucose-Capillary: 155 mg/dL — ABNORMAL HIGH (ref 70–99)

## 2021-07-15 LAB — TROPONIN I (HIGH SENSITIVITY)
Troponin I (High Sensitivity): 85 ng/L — ABNORMAL HIGH (ref <18)
Troponin I (High Sensitivity): 77 ng/L — ABNORMAL HIGH (ref <18)

## 2021-07-15 LAB — MAGNESIUM: Magnesium: 2 mg/dL (ref 1.7–2.4)

## 2021-07-15 LAB — TRIGLYCERIDES: Triglycerides: 41 mg/dL (ref <150)

## 2021-07-15 MED ORDER — MIDAZOLAM-SODIUM CHLORIDE 100-0.9 MG/100ML-% IV SOLN
0.0000 mg/h | INTRAVENOUS | Status: DC
  Administered 2021-07-15: 2 mg/h via INTRAVENOUS
  Filled 2021-07-15: qty 100

## 2021-07-15 MED ORDER — OSMOLITE 1.5 CAL PO LIQD
1000.0000 mL | ORAL | Status: DC
Start: 2021-07-15 — End: 2021-07-19
  Administered 2021-07-15 – 2021-07-17 (×3): 1000 mL
  Filled 2021-07-15 (×7): qty 1000

## 2021-07-15 MED ORDER — POLYETHYLENE GLYCOL 3350 17 G PO PACK
17.0000 g | PACK | Freq: Every day | ORAL | Status: DC
  Administered 2021-07-17: 17 g
  Filled 2021-07-15 (×2): qty 1

## 2021-07-15 MED ORDER — POTASSIUM CHLORIDE 10 MEQ/100ML IV SOLN: 10.0000 meq | INTRAVENOUS | Status: DC

## 2021-07-15 MED ORDER — PROSOURCE TF PO LIQD
45.0000 mL | Freq: Three times a day (TID) | ORAL | Status: DC
  Administered 2021-07-15 – 2021-07-18 (×10): 45 mL
  Filled 2021-07-15 (×10): qty 45

## 2021-07-15 MED ORDER — DOCUSATE SODIUM 50 MG/5ML PO LIQD
100.0000 mg | Freq: Two times a day (BID) | ORAL | Status: DC
  Administered 2021-07-15 – 2021-07-17 (×3): 100 mg
  Filled 2021-07-15 (×4): qty 10

## 2021-07-15 MED ORDER — MIDAZOLAM BOLUS VIA INFUSION
0.0000 mg | INTRAVENOUS | Status: DC | PRN
  Administered 2021-07-15 – 2021-07-16 (×5): 2 mg via INTRAVENOUS

## 2021-07-15 MED ORDER — MIDAZOLAM HCL 2 MG/2ML IJ SOLN
1.0000 mg | INTRAMUSCULAR | Status: AC | PRN
  Administered 2021-07-15 (×3): 1 mg via INTRAVENOUS
  Filled 2021-07-15: qty 2

## 2021-07-15 MED ORDER — MIDAZOLAM HCL 2 MG/2ML IJ SOLN: 1.0000 mg | INTRAMUSCULAR | Status: DC | PRN

## 2021-07-15 MED ORDER — POLYETHYLENE GLYCOL 3350 17 G PO PACK: 17.0000 g | PACK | Freq: Every day | ORAL | Status: DC

## 2021-07-15 MED ORDER — POTASSIUM CHLORIDE 20 MEQ PO PACK
20.0000 meq | PACK | Freq: Once | ORAL | Status: AC
  Administered 2021-07-15: 20 meq
  Filled 2021-07-15: qty 1

## 2021-07-15 MED ORDER — SENNOSIDES-DOCUSATE SODIUM 8.6-50 MG PO TABS
2.0000 | ORAL_TABLET | Freq: Two times a day (BID) | ORAL | Status: DC
Start: 2021-07-15 — End: 2021-07-19
  Administered 2021-07-15 – 2021-07-17 (×3): 2 via NASOGASTRIC
  Filled 2021-07-15 (×4): qty 2

## 2021-07-15 MED ORDER — POTASSIUM CHLORIDE 10 MEQ/100ML IV SOLN: 10.0000 meq | INTRAVENOUS | Status: AC

## 2021-07-15 NOTE — Progress Notes (Signed)
[place] Progress Note Patient Name: Nathaniel Hicks DOB: October 30, 1940 MRN: [id number]   Date of Service  07/15/2021  HPI/Events of Note  Pt remains intubated and received request to renew wrist restraints.   eICU Interventions  Wrist restraints reordered.     Intervention Category Minor Interventions: Other:  Elsie Lincoln 07/15/2021, 10:20 PM

## 2021-07-15 NOTE — Progress Notes (Signed)
NAME:  Nathaniel Hicks, MRN:  [id number], DOB:  1940-04-12, LOS: 1 ADMISSION DATE:  07/14/2021, CONSULTATION DATE:  07/14/2020 REFERRING MD:  EDP, CHIEF COMPLAINT:  facial swelling.    History of Present Illness:  HPI obtained from medical chart review as patient is intubated and sedated on mechanical ventilation.    81 year old male with PMH as below who presented to [facility] ER with left side jaw swelling and odontogenic pain for one month with voice changes, dysphagia, and poor PO intake over the last 24hrs.   In ER, temp 99.9, tachycardic, normotensive and normoxia.  Was noted to have hot-potato voice and firm swelling under left mandible and sublingual areas.  Labs noted for WBC 11.6, CRP 5.5, sed rate 33, lactic 2.5.  CXR neg.  CT soft tissues noted for soft tissue swelling in the left submandibular region, oropharynx including tongue base, supraglottic larynx, and with probable extension into the floor of the mouth.  Also noted enlarged contralateral right submandibular node and left submandibular gland; no abscess, and neoplastic etiology not excluded but appearance favors inflammatory etiology.  Cultures sent and started on cefepime, flagyl, and vancomycin.  Given concern for airway compromise, patient was electively intubated.  Patient to be transferred to [facility], PCCM accepting.  Pertinent  Medical History  Tobacco abuse, HTN, HLD, arthritis, BPH, testicular cancer s/p right radial orchiectomy, hearing loss Significant Hospital Events: Including procedures, antibiotic start and stop dates in addition to other pertinent events   07/03-Intubated and abx started.  Interim History / Subjective:  Agitated overnight, propofol gtt started and wrist restraints ordered overnight.  Review of Systems:   Unable to obtain due to sedation.  Objective: afebrile overnight but Tmax 100.6 since yesterday. MAP>65, bradycardic in 40s.  Blood pressure (!) 133/55, pulse 65, temperature (!) 96.3 F (35.7 C), resp.  rate 14, height '5\' 11"'$  (1.803 m), weight 83.9 kg, SpO2 99 %.    Vent Mode: PRVC FiO2 (%):  [30 %-80 %] 30 % Set Rate:  [14 bmp] 14 bmp Vt Set:  [600 mL] 600 mL PEEP:  [5 cmH20] 5 cmH20 Plateau Pressure:  [11 cmH20-18 cmH20] 18 cmH20   Intake/Output Summary (Last 24 hours) at 07/15/2021 1914 Last data filed at 07/15/2021 0400 Gross per 24 hour  Intake 3951.5 ml  Output --  Net 3951.5 ml   Filed Weights   07/14/21 1146  Weight: 83.9 kg   Examination: General: elderly male intubated and sedated HENT: neck swelling appreciated, arcus senilis. Lungs: ventilated lung sounds Cardiovascular: bradycardic, NSR Abdomen: distended, diminished bowel sounds Extremities: normal bulk and tone,  Neuro: sedated, opens eyes but does not follow commands GU: foley present with dark yellow urine.  Consults  ENT Urology Resolved Hospital Problem list    Assessment & Plan:  Left submandibular swelling  Sepsis   Hypotension- sedation related 2/2 propofol, since resolved Etiology from odontogenic vs neoplastic. Suspect infectious/ inflammatory given rapid progression of symptoms over the last 24hrs. He was intubated in the ED to protect the airway.  ENT consulted and Dr. Fredric Dine with ENT states nothing to do from surgical standpoint at this time.  Feels more infectious/ inflammatory and to treat as such, medical management for now.  If not improving, will need to rule out malignancy.   -ENT recs include to continue decadron '8mg'$  q 8hr x 3 doses after initial '10mg'$  and post extubation for good oral/ dental exam.   ENT available as needed.  - continue zosyn and discontinue vanc.  -  follow blood cultures  - full MV support, PRVC> not considering extubation till swelling improves.  Ensure cuff leak prior to extubation.  - PAD protocol for RASS goal -1/-2 with scheduled bowel regimen.   - CBGs q4, add SSI if > 180, likely exacerbated by steroids  - if not extubated tomorrow, consider OGT/  TF  Bradycardia Appears secondary to sedation especially propofol. HR 30-50s. More pronounced when patient is sleeping and increases when patient is awakened. Propofol has been turned off and HR improved to 60s. -Will CTM.  HTN: home med is amlodipine-benzapril 10-20 mg qd. Holding due to normotensive Bps.   Best Practice (right click and "Reselect all SmartList Selections" daily)  Diet/type: NPO DVT prophylaxis: prophylactic heparin  GI prophylaxis: PPI Lines: N/A Foley:  N/A Continuous: precedex, fentanyl Code Status:  full code Last date of multidisciplinary goals of care discussion [NA] Labs   CBC: Recent Labs  Lab 07/14/21 0727 07/14/21 1501 07/15/21 0542  WBC 11.6*  --  15.0*  NEUTROABS 8.7*  --   --   HGB 14.5 12.6* 12.5*  HCT 44.5 37.0* 38.2*  MCV 102.5*  --  100.0  PLT 244  --  627   Basic Metabolic Panel: Recent Labs  Lab 07/14/21 0727 07/14/21 1501  NA 136 136  K 3.9 4.1  CL 105  --   CO2 25  --   GLUCOSE 126*  --   BUN 8  --   CREATININE 0.86  --   CALCIUM 9.8  --    GFR: Estimated Creatinine Clearance: 73 mL/min (by C-G formula based on SCr of 0.86 mg/dL). Recent Labs  Lab 07/14/21 0727 07/14/21 0846 07/14/21 1121 07/15/21 0542  WBC 11.6*  --   --  15.0*  LATICACIDVEN  --  2.5* 1.6  --    Liver Function Tests: Recent Labs  Lab 07/14/21 0727  AST 14*  ALT 12  ALKPHOS 62  BILITOT 0.8  PROT 8.5*  ALBUMIN 4.3   No results for input(s): "LIPASE", "AMYLASE" in the last 168 hours. No results for input(s): "AMMONIA" in the last 168 hours. ABG    Component Value Date/Time   PHART 7.412 07/14/2021 1501   PCO2ART 39.4 07/14/2021 1501   PO2ART 284 (H) 07/14/2021 1501   HCO3 24.8 07/14/2021 1501   TCO2 26 07/14/2021 1501   O2SAT 100 07/14/2021 1501    Coagulation Profile: Recent Labs  Lab 07/14/21 0727  INR 1.0   Cardiac Enzymes: No results for input(s): "CKTOTAL", "CKMB", "CKMBINDEX", "TROPONINI" in the last 168 hours. HbA1C: No  results found for: "HGBA1C" CBG: Recent Labs  Lab 07/14/21 1400 07/14/21 1637 07/14/21 1945 07/14/21 2325 07/15/21 0314  GLUCAP 125* 133* 145* 137* 139*   Past Medical History:  He,  has a past medical history of Arthritis, BPH (benign prostatic hyperplasia), HOH (hard of hearing), Hyperlipidemia (11/02/2017), Hypertension, Hypertension (11/02/2017), 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[place]), and Vitamin D deficiency (11/02/2017).  Surgical History:   Past Surgical History:  Procedure Laterality Date   COLONOSCOPY N/A 11/24/2012   Procedure: COLONOSCOPY;  Surgeon: Rogene Houston, MD;  Location[facility];  Service: Endoscopy;  Laterality: N/A;  830-moved to [place] notified pt   HEMORROIDECTOMY     KNEE ARTHROSCOPY WITH LATERAL MENISECTOMY Right 08/31/2017   Procedure: KNEE ARTHROSCOPY WITH LATERAL MENISECTOMY;  Surgeon: Carole Civil, MD;  Location: [facility] ORS;  Service: Orthopedics;  Laterality: Right;   LESION EXCISION N/A 03/23/2012   Procedure: EXCISION NEOPLASM SCALP ;  Surgeon: Jamesetta So, MD;  Location: [facility] ORS;  Service: General;  Laterality: N/A;  Excision of Scalp Neoplasm   ORCHIECTOMY Right 12/20/2012   Procedure: RIGHT RADICAL ORCHIECTOMY/POSSIBLE BX RIGHT TESTICLE;  Surgeon: Marissa Nestle, MD;  Location: [facility] ORS;  Service: Urology;  Laterality: Right;   PROSTATE SURGERY     SCALP LACERATION REPAIR     [facility]-Dr Tamala Julian    Social History:   reports that he has been smoking cigarettes. He has a 12.50 pack-year smoking history. He has never used smokeless tobacco. He reports that he does not currently use alcohol. He reports that he does not use drugs.  Family History:  His family history includes Cancer in his brother and brother.  Allergies No Known Allergies  Home Medications  Prior to Admission medications   Medication Sig Start Date End Date Taking? Authorizing Provider  amLODipine-benazepril (LOTREL) 10-40 MG capsule Take 1 capsule by mouth daily. 05/07/21  Yes [provider]  Cholecalciferol (VITAMIN D3) 50000 units TABS Take 5,000 Units by mouth daily.   Yes [provider]  gabapentin (NEURONTIN) 300 MG capsule Take 300 mg by mouth 2 (two) times daily.   Yes [provider]  hydrALAZINE (APRESOLINE) 25 MG tablet Take 25 mg by mouth 2 (two) times daily. 05/08/21  Yes [provider]  magnesium oxide (MAG-OX) 400 MG tablet Take 400 mg by mouth daily.   Yes [provider]  oxyCODONE (OXY IR/ROXICODONE) 5 MG immediate release tablet Take 5 mg by mouth 3 (three) times daily as needed for pain. 07/13/17  Yes [provider]  pravastatin (PRAVACHOL) 40 MG tablet Take 80 mg by mouth daily.   Yes [provider]  RAPAFLO 8 MG CAPS capsule Take 8 mg by mouth daily.  10/13/13  Yes [provider]  vitamin B-12 (CYANOCOBALAMIN) 1000 MCG tablet Take 1,000 mcg by mouth daily.   Yes [provider]     Idamae Schuller, MD [facility] Internal Medicine Residency, PGY-2

## 2021-07-15 NOTE — Progress Notes (Signed)
12m of Versed from day shift via infusion wasted with BSela Hilding Not listed in Pyxis for waste documentation.

## 2021-07-15 NOTE — Progress Notes (Addendum)
Initial Nutrition Assessment  DOCUMENTATION CODES:   Not applicable  INTERVENTION:   Initiate tube feeding via NG tube: Osmolite 1.5 at 60 ml/h (1440 ml per day) Prosource TF 45 ml TID  Provides 2280 kcal, 123 gm protein, 1097 ml free water daily.  NUTRITION DIAGNOSIS:   Inadequate oral intake related to inability to eat as evidenced by NPO status.  GOAL:   Patient will meet greater than or equal to 90% of their needs  MONITOR:   Vent status, TF tolerance, Labs  REASON FOR ASSESSMENT:   Ventilator, Consult Enteral/tube feeding initiation and management  ASSESSMENT:   81 yo male admitted with facial swelling. PMH includes tobacco abuse, HTN, HLD, arthritis, BPH, testicular cancer, hearing loss.  Patient with dysphagia and poor intake for 24 hours PTA. Jaw swelling and pain x 1 month PTA. Plan for medical management of infection for now. Receiving IV antibiotics. No surgery planned at this time.    NG placed by RN this morning. Tip is gastric per x-ray. Received MD Consult for assessment of nutrition status. Per discussion with MD, okay to begin TF today.   Patient is currently intubated on ventilator support MV: 13.3 L/min Temp (24hrs), Avg:97.2 F (36.2 C), Min:96.3 F (35.7 C), Max:100.6 F (38.1 C)  Propofol: off  Labs reviewed.  CBG: 139-134  Medications reviewed and include Decadron, Novolog, Protonix, Miralax, Senokot-S, fentanyl.  Weight history reviewed.  7% weight loss over the past 8 months is not significant for the time frame.  NUTRITION - FOCUSED PHYSICAL EXAM:  Flowsheet Row Most Recent Value  Orbital Region No depletion  Upper Arm Region No depletion  Thoracic and Lumbar Region No depletion  Buccal Region Unable to assess  Temple Region No depletion  Clavicle Bone Region No depletion  Clavicle and Acromion Bone Region No depletion  Scapular Bone Region No depletion  Dorsal Hand No depletion  Patellar Region No depletion  Anterior  Thigh Region No depletion  Posterior Calf Region No depletion  Edema (RD Assessment) None  Hair Reviewed  Eyes Unable to assess  Mouth Unable to assess  Skin Reviewed  Nails Reviewed       Diet Order:   Diet Order             Diet NPO time specified  Diet effective now                   EDUCATION NEEDS:   No education needs have been identified at this time  Skin:  Skin Assessment: Reviewed RN Assessment  Last BM:  No BM documented  Height:   Ht Readings from Last 1 Encounters:  07/14/21 '5\' 11"'$  (1.803 m)    Weight:   Wt Readings from Last 1 Encounters:  07/14/21 83.9 kg     BMI:  Body mass index is 25.8 kg/m.  Estimated Nutritional Needs:   Kcal:  2100-2300  Protein:  115-130 gm  Fluid:  2.1-2.3 L   Lucas Mallow RD, LDN, CNSC Please refer to Amion for contact information.

## 2021-07-16 DIAGNOSIS — R220 Localized swelling, mass and lump, head: Secondary | ICD-10-CM | POA: Diagnosis not present

## 2021-07-16 LAB — CBC
HCT: 35.7 % — ABNORMAL LOW (ref 39.0–52.0)
Hemoglobin: 11.8 g/dL — ABNORMAL LOW (ref 13.0–17.0)
MCH: 33.1 pg (ref 26.0–34.0)
MCHC: 33.1 g/dL (ref 30.0–36.0)
MCV: 100.3 fL — ABNORMAL HIGH (ref 80.0–100.0)
Platelets: 210 10*3/uL (ref 150–400)
RBC: 3.56 MIL/uL — ABNORMAL LOW (ref 4.22–5.81)
RDW: 13.2 % (ref 11.5–15.5)
WBC: 16.6 10*3/uL — ABNORMAL HIGH (ref 4.0–10.5)
nRBC: 0 % (ref 0.0–0.2)

## 2021-07-16 LAB — GLUCOSE, CAPILLARY
Glucose-Capillary: 102 mg/dL — ABNORMAL HIGH (ref 70–99)
Glucose-Capillary: 129 mg/dL — ABNORMAL HIGH (ref 70–99)
Glucose-Capillary: 136 mg/dL — ABNORMAL HIGH (ref 70–99)
Glucose-Capillary: 138 mg/dL — ABNORMAL HIGH (ref 70–99)
Glucose-Capillary: 152 mg/dL — ABNORMAL HIGH (ref 70–99)
Glucose-Capillary: 98 mg/dL (ref 70–99)

## 2021-07-16 LAB — BASIC METABOLIC PANEL
Anion gap: 11 (ref 5–15)
BUN: 23 mg/dL (ref 8–23)
CO2: 19 mmol/L — ABNORMAL LOW (ref 22–32)
Calcium: 9.3 mg/dL (ref 8.9–10.3)
Chloride: 110 mmol/L (ref 98–111)
Creatinine, Ser: 0.98 mg/dL (ref 0.61–1.24)
GFR, Estimated: >60 mL/min (ref >60)
Glucose, Bld: 131 mg/dL — ABNORMAL HIGH (ref 70–99)
Potassium: 4.3 mmol/L (ref 3.5–5.1)
Sodium: 140 mmol/L (ref 135–145)

## 2021-07-16 MED ORDER — DEXMEDETOMIDINE HCL IN NACL 400 MCG/100ML IV SOLN
INTRAVENOUS | Status: AC
  Filled 2021-07-16: qty 100

## 2021-07-16 MED ORDER — GABAPENTIN 300 MG PO CAPS
300.0000 mg | ORAL_CAPSULE | Freq: Two times a day (BID) | ORAL | Status: DC
  Administered 2021-07-16 – 2021-07-23 (×14): 300 mg
  Filled 2021-07-16 (×15): qty 1

## 2021-07-16 MED ORDER — NALOXONE HCL 0.4 MG/ML IJ SOLN: 0.2000 mg | INTRAMUSCULAR | Status: DC | PRN

## 2021-07-16 MED ORDER — NALOXONE HCL 0.4 MG/ML IJ SOLN
INTRAMUSCULAR | Status: AC
  Administered 2021-07-16: 0.2 mg via INTRAVENOUS
  Filled 2021-07-16: qty 1

## 2021-07-16 MED ORDER — BISACODYL 10 MG RE SUPP
10.0000 mg | Freq: Once | RECTAL | Status: AC
Start: 2021-07-16 — End: 2021-07-16
  Administered 2021-07-16: 10 mg via RECTAL
  Filled 2021-07-16: qty 1

## 2021-07-16 MED ORDER — ATROPINE SULFATE 1 MG/10ML IJ SOSY
PREFILLED_SYRINGE | INTRAMUSCULAR | Status: AC
  Filled 2021-07-16: qty 10

## 2021-07-16 MED ORDER — DEXMEDETOMIDINE HCL IN NACL 400 MCG/100ML IV SOLN: 0.0000 ug/kg/h | INTRAVENOUS | Status: DC

## 2021-07-16 NOTE — Procedures (Signed)
Extubation Procedure Note  Patient Details:   Name: Nathaniel Hicks DOB: March 29, 1940 MRN: [id number]   Airway Documentation:    Vent end date: 07/16/21 Vent end time: 1232   Evaluation  O2 sats: stable throughout Complications: No apparent complications Patient did tolerate procedure well. Bilateral Breath Sounds: Clear, Diminished   Yes  Patient placed on 10L [place]. Patient is able to speak and cuff leak was present.  Quentin Ore 07/16/2021, 12:49 PM

## 2021-07-16 NOTE — Progress Notes (Signed)
NAME:  Nathaniel Hicks, MRN:  [id number], DOB:  1940-05-22, LOS: 2 ADMISSION DATE:  07/14/2021, CONSULTATION DATE:  07/14/2020 REFERRING MD:  EDP, CHIEF COMPLAINT:  facial swelling.    History of Present Illness:  HPI obtained from medical chart review as patient is intubated and sedated on mechanical ventilation.    81 year old male with PMH as below who presented to [facility] ER with left side jaw swelling and odontogenic pain for one month with voice changes, dysphagia, and poor PO intake over the last 24hrs.   In ER, temp 99.9, tachycardic, normotensive and normoxia.  Was noted to have hot-potato voice and firm swelling under left mandible and sublingual areas.  Labs noted for WBC 11.6, CRP 5.5, sed rate 33, lactic 2.5.  CXR neg.  CT soft tissues noted for soft tissue swelling in the left submandibular region, oropharynx including tongue base, supraglottic larynx, and with probable extension into the floor of the mouth.  Also noted enlarged contralateral right submandibular node and left submandibular gland; no abscess, and neoplastic etiology not excluded but appearance favors inflammatory etiology.  Cultures sent and started on cefepime, flagyl, and vancomycin.  Given concern for airway compromise, patient was electively intubated.  Patient to be transferred to [facility], PCCM accepting.  Pertinent  Medical History  Tobacco abuse, HTN, HLD, arthritis, BPH, testicular cancer s/p right radial orchiectomy, hearing loss Significant Hospital Events: Including procedures, antibiotic start and stop dates in addition to other pertinent events   07/03-Intubated and abx started.  Interim History / Subjective:   Review of Systems:   Unable to obtain due to sedation.  Objective: afebrile overnight. MAP>65, bradycardic in 40s.  Blood pressure (!) 114/50, pulse (!) 35, temperature (!) 97.2 F (36.2 C), resp. rate 14, height '5\' 11"'$  (1.803 m), weight 83.8 kg, SpO2 96 %.    Vent Mode: PRVC FiO2 (%):  [30 %] 30 % Set  Rate:  [14 bmp] 14 bmp Vt Set:  [600 mL] 600 mL PEEP:  [5 cmH20] 5 cmH20 Plateau Pressure:  [15 cmH20] 15 cmH20   Intake/Output Summary (Last 24 hours) at 07/16/2021 0656 Last data filed at 07/16/2021 0531 Gross per 24 hour  Intake 1878.88 ml  Output 850 ml  Net 1028.88 ml   Input: 1.878 L Output: 850 cc Net: +1.029 L Filed Weights   07/14/21 1146 07/16/21 0341  Weight: 83.9 kg 83.8 kg   Examination: General: elderly male intubated and sedated initially but extubated later during the day.  HENT: neck swelling improved from yesterday, arcus senilis. Lungs: clear lung sounds Cardiovascular: normal rate, NSR Abdomen: slightly distended, normal bowel sounds Extremities: normal bulk and tone,  Neuro: sedated, opens eyes but does not follow commands GU: foley present with light yellow urine.  Consults  ENT Urology Resolved Hospital Problem list   Bradycardia.  Assessment & Plan:  Left submandibular swelling  Sepsis   Hypotension- sedation related 2/2 propofol, since resolved Etiology from odontogenic infection. Neoplastic etiology was considered but less likely. Suspect infectious/ inflammatory given rapid progression of symptoms over the last 24hrs. He was intubated in the ED to protect the airway. He has been extubated and doing well on [place]. His mentation is improving.  He  received decadron 8 mg TID but we will discontinue this to improve his mental status. If he does well, he may be able to transition out tomorrow.  - continue zosyn  - follow blood cultures; NGTD - ENT available as needed.   Bradycardia (resolved) It  has resolved post extubation. Appears secondary to sedation especially propofol. HR 30-50s. More pronounced when patient is sleeping and increases when patient is awakened. MAP>65.  HTN:  Home med is amlodipine-benzapril 10-20 mg qd. Holding due to normotensive Bps but will add home med if he stays hypertensive.  -CTM   Best Practice (right click and "Reselect  all SmartList Selections" daily)  Diet/type: Tube feeds DVT prophylaxis: prophylactic heparin  GI prophylaxis: PPI Lines: N/A Foley:  N/A Continuous: precedex Code Status:  full code Last date of multidisciplinary goals of care discussion [07/05 sisters updated at bedside] Labs: Leukocytosis stable at 16.6. Anemia at 11.8 with MCV 100.3. Normal renal fxn. Bicarb 19. Normal K, Na. NGTD on cultures.   CBC: Recent Labs  Lab 07/14/21 0727 07/14/21 1501 07/15/21 0542 07/16/21 0117  WBC 11.6*  --  15.0* 16.6*  NEUTROABS 8.7*  --   --   --   HGB 14.5 12.6* 12.5* 11.8*  HCT 44.5 37.0* 38.2* 35.7*  MCV 102.5*  --  100.0 100.3*  PLT 244  --  236 427    Basic Metabolic Panel: Recent Labs  Lab 07/14/21 0727 07/14/21 1501 07/15/21 0542 07/15/21 0929 07/16/21 0117  NA 136 136 137  --  140  K 3.9 4.1 3.8  --  4.3  CL 105  --  108  --  110  CO2 25  --  20*  --  19*  GLUCOSE 126*  --  138*  --  131*  BUN 8  --  13  --  23  CREATININE 0.86  --  0.99  --  0.98  CALCIUM 9.8  --  9.3  --  9.3  MG  --   --   --  2.0  --     GFR: Estimated Creatinine Clearance: 64 mL/min (by C-G formula based on SCr of 0.98 mg/dL). Recent Labs  Lab 07/14/21 0727 07/14/21 0846 07/14/21 1121 07/15/21 0542 07/16/21 0117  WBC 11.6*  --   --  15.0* 16.6*  LATICACIDVEN  --  2.5* 1.6  --   --     Liver Function Tests: Recent Labs  Lab 07/14/21 0727  AST 14*  ALT 12  ALKPHOS 62  BILITOT 0.8  PROT 8.5*  ALBUMIN 4.3    No results for input(s): "LIPASE", "AMYLASE" in the last 168 hours. No results for input(s): "AMMONIA" in the last 168 hours. ABG    Component Value Date/Time   PHART 7.412 07/14/2021 1501   PCO2ART 39.4 07/14/2021 1501   PO2ART 284 (H) 07/14/2021 1501   HCO3 24.8 07/14/2021 1501   TCO2 26 07/14/2021 1501   O2SAT 100 07/14/2021 1501    Coagulation Profile: Recent Labs  Lab 07/14/21 0727  INR 1.0    Cardiac Enzymes: No results for input(s): "CKTOTAL", "CKMB",  "CKMBINDEX", "TROPONINI" in the last 168 hours. HbA1C: No results found for: "HGBA1C" CBG: Recent Labs  Lab 07/15/21 1249 07/15/21 1616 07/15/21 1934 07/15/21 2336 07/16/21 0331  GLUCAP 111* 137* 155* 142* 129*    Past Medical History:  He,  has a past medical history of Arthritis, BPH (benign prostatic hyperplasia), HOH (hard of hearing), Hyperlipidemia (11/02/2017), Hypertension, Hypertension (11/02/2017), 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[place]), and Vitamin D deficiency (11/02/2017).  Surgical History:   Past Surgical History:  Procedure Laterality Date   COLONOSCOPY N/A 11/24/2012   Procedure: COLONOSCOPY;  Surgeon: Rogene Houston, MD;  Location: [facility];  Service: Endoscopy;  Laterality: N/A;  830-moved to [place]  notified pt   HEMORROIDECTOMY     KNEE ARTHROSCOPY WITH LATERAL MENISECTOMY Right 08/31/2017   Procedure: KNEE ARTHROSCOPY WITH LATERAL MENISECTOMY;  Surgeon: Carole Civil, MD;  Location: [facility] ORS;  Service: Orthopedics;  Laterality: Right;   LESION EXCISION N/A 03/23/2012   Procedure: EXCISION NEOPLASM SCALP ;  Surgeon: Jamesetta So, MD;  Location: [facility] ORS;  Service: General;  Laterality: N/A;  Excision of Scalp Neoplasm   ORCHIECTOMY Right 12/20/2012   Procedure: RIGHT RADICAL ORCHIECTOMY/POSSIBLE BX RIGHT TESTICLE;  Surgeon: Marissa Nestle, MD;  Location: [facility] ORS;  Service: Urology;  Laterality: Right;   PROSTATE SURGERY     SCALP LACERATION REPAIR     [facility]-Dr Tamala Julian    Social History:   reports that he has been smoking cigarettes. He has a 12.50 pack-year smoking history. He has never used smokeless tobacco. He reports that he does not currently use alcohol. He reports that he does not use drugs.  Family History:  His family history includes Cancer in his brother and brother.  Allergies No Known Allergies  Home Medications  Prior to Admission medications   Medication Sig Start Date End Date Taking? Authorizing Provider  amLODipine-benazepril (LOTREL) 10-40 MG  capsule Take 1 capsule by mouth daily. 05/07/21  Yes [provider]  Cholecalciferol (VITAMIN D3) 50000 units TABS Take 5,000 Units by mouth daily.   Yes [provider]  gabapentin (NEURONTIN) 300 MG capsule Take 300 mg by mouth 2 (two) times daily.   Yes [provider]  hydrALAZINE (APRESOLINE) 25 MG tablet Take 25 mg by mouth 2 (two) times daily. 05/08/21  Yes [provider]  magnesium oxide (MAG-OX) 400 MG tablet Take 400 mg by mouth daily.   Yes [provider]  oxyCODONE (OXY IR/ROXICODONE) 5 MG immediate release tablet Take 5 mg by mouth 3 (three) times daily as needed for pain. 07/13/17  Yes [provider]  pravastatin (PRAVACHOL) 40 MG tablet Take 80 mg by mouth daily.   Yes [provider]  RAPAFLO 8 MG CAPS capsule Take 8 mg by mouth daily.  10/13/13  Yes [provider]  vitamin B-12 (CYANOCOBALAMIN) 1000 MCG tablet Take 1,000 mcg by mouth daily.   Yes [provider]     Idamae Schuller, MD Tillie Rung. [facility] Internal Medicine Residency, PGY-2

## 2021-07-17 DIAGNOSIS — R220 Localized swelling, mass and lump, head: Secondary | ICD-10-CM | POA: Diagnosis not present

## 2021-07-17 LAB — CBC
HCT: 40.7 % (ref 39.0–52.0)
Hemoglobin: 13.5 g/dL (ref 13.0–17.0)
MCH: 33.3 pg (ref 26.0–34.0)
MCHC: 33.2 g/dL (ref 30.0–36.0)
MCV: 100.5 fL — ABNORMAL HIGH (ref 80.0–100.0)
Platelets: 272 10*3/uL (ref 150–400)
RBC: 4.05 MIL/uL — ABNORMAL LOW (ref 4.22–5.81)
RDW: 13.4 % (ref 11.5–15.5)
WBC: 15.3 10*3/uL — ABNORMAL HIGH (ref 4.0–10.5)
nRBC: 0 % (ref 0.0–0.2)

## 2021-07-17 LAB — GLUCOSE, CAPILLARY
Glucose-Capillary: 111 mg/dL — ABNORMAL HIGH (ref 70–99)
Glucose-Capillary: 146 mg/dL — ABNORMAL HIGH (ref 70–99)
Glucose-Capillary: 128 mg/dL — ABNORMAL HIGH (ref 70–99)
Glucose-Capillary: 130 mg/dL — ABNORMAL HIGH (ref 70–99)
Glucose-Capillary: 144 mg/dL — ABNORMAL HIGH (ref 70–99)
Glucose-Capillary: 125 mg/dL — ABNORMAL HIGH (ref 70–99)

## 2021-07-17 LAB — BASIC METABOLIC PANEL
Anion gap: 5 (ref 5–15)
BUN: 25 mg/dL — ABNORMAL HIGH (ref 8–23)
CO2: 22 mmol/L (ref 22–32)
Calcium: 9.5 mg/dL (ref 8.9–10.3)
Chloride: 114 mmol/L — ABNORMAL HIGH (ref 98–111)
Creatinine, Ser: 1.05 mg/dL (ref 0.61–1.24)
GFR, Estimated: >60 mL/min (ref >60)
Glucose, Bld: 153 mg/dL — ABNORMAL HIGH (ref 70–99)
Potassium: 4.1 mmol/L (ref 3.5–5.1)
Sodium: 141 mmol/L (ref 135–145)

## 2021-07-17 MED ORDER — HYDRALAZINE HCL 25 MG PO TABS
25.0000 mg | ORAL_TABLET | Freq: Two times a day (BID) | ORAL | Status: DC
  Administered 2021-07-17 – 2021-07-18 (×3): 25 mg
  Filled 2021-07-17 (×3): qty 1

## 2021-07-17 MED ORDER — HYDRALAZINE HCL 50 MG PO TABS
25.0000 mg | ORAL_TABLET | Freq: Once | ORAL | Status: AC
  Administered 2021-07-17: 25 mg
  Filled 2021-07-17: qty 1

## 2021-07-17 MED ORDER — BENAZEPRIL HCL 20 MG PO TABS
40.0000 mg | ORAL_TABLET | Freq: Every day | ORAL | Status: DC
  Administered 2021-07-17 – 2021-07-23 (×7): 40 mg
  Filled 2021-07-17 (×6): qty 2
  Filled 2021-07-17: qty 1
  Filled 2021-07-17: qty 2

## 2021-07-17 MED ORDER — AMLODIPINE BESYLATE 10 MG PO TABS
10.0000 mg | ORAL_TABLET | Freq: Every day | ORAL | Status: DC
  Administered 2021-07-17 – 2021-07-23 (×7): 10 mg
  Filled 2021-07-17 (×7): qty 1

## 2021-07-17 MED ORDER — HYDRALAZINE HCL 20 MG/ML IJ SOLN: 10.0000 mg | Freq: Four times a day (QID) | INTRAMUSCULAR | Status: DC | PRN

## 2021-07-17 MED ORDER — ACETAMINOPHEN 160 MG/5ML PO SOLN
650.0000 mg | Freq: Four times a day (QID) | ORAL | Status: DC | PRN
  Administered 2021-07-17 – 2021-07-19 (×3): 650 mg
  Filled 2021-07-17 (×2): qty 20.3

## 2021-07-17 MED ORDER — ACETAMINOPHEN 325 MG PO TABS: 650.0000 mg | ORAL_TABLET | Freq: Four times a day (QID) | ORAL | Status: DC | PRN

## 2021-07-17 NOTE — Progress Notes (Signed)
NAME:  Nathaniel Hicks, MRN:  [id number], DOB:  January 09, 1941, LOS: 3 ADMISSION DATE:  07/14/2021, CONSULTATION DATE:  07/14/2020 REFERRING MD:  EDP, CHIEF COMPLAINT:  facial swelling.    History of Present Illness:  HPI obtained from medical chart review as patient is intubated and sedated on mechanical ventilation.    81 year old male with PMH as below who presented to [facility] ER with left side jaw swelling and odontogenic pain for one month with voice changes, dysphagia, and poor PO intake over the last 24hrs.   In ER, temp 99.9, tachycardic, normotensive and normoxia.  Was noted to have hot-potato voice and firm swelling under left mandible and sublingual areas.  Labs noted for WBC 11.6, CRP 5.5, sed rate 33, lactic 2.5.  CXR neg.  CT soft tissues noted for soft tissue swelling in the left submandibular region, oropharynx including tongue base, supraglottic larynx, and with probable extension into the floor of the mouth.  Also noted enlarged contralateral right submandibular node and left submandibular gland; no abscess, and neoplastic etiology not excluded but appearance favors inflammatory etiology.  Cultures sent and started on cefepime, flagyl, and vancomycin.  Given concern for airway compromise, patient was electively intubated.  Patient to be transferred to [facility], PCCM accepted patient 07/14/21. Pertinent  Medical History  Tobacco abuse, HTN, HLD, arthritis, BPH, testicular cancer s/p right radial orchiectomy, hearing loss Significant Hospital Events: Including procedures, antibiotic start and stop dates in addition to other pertinent events   07/03-Intubated and abx started.  Interim History / Subjective:  Doing well this am. No acute concerns endorsed.  Review of Systems:   Negative unless stated otherwise.  Objective: afebrile overnight. Slightly hypertensive. MAP>65, HR 60-100s.   Blood pressure (!) 154/66, pulse 65, temperature 98.2 F (36.8 C), temperature source Oral, resp. rate 10,  height '5\' 11"'$  (1.803 m), weight 83.8 kg, SpO2 100 %.    On [place] 2 L. Intake/Output Summary (Last 24 hours) at 07/17/2021 0706 Last data filed at 07/17/2021 0500 Gross per 24 hour  Intake 867.54 ml  Output 540 ml  Net 327.54 ml   Input: 911.6 cc Output: 540 cc Net: +371.6 Filed Weights   07/14/21 1146 07/16/21 0341 07/17/21 0500  Weight: 83.9 kg 83.8 kg 83.8 kg   Examination: General: elderly male laying in bed comfortably. HENT: neck swelling improved from yesterday but present, bilateral lumps noted in the neck. Arcus senilis noted as well. Lungs: clear lung sounds Cardiovascular: normal rate, NSR Abdomen: slightly distended, normal bowel sounds Extremities: normal bulk and tone,  Neuro: alert and oriented x4 GU: foley present with light yellow urine.  Consults  ENT Urology Resolved Hospital Problem list   Bradycardia.  Hypotension Assessment & Plan:  Sepsis 2/2 to odontogenic infection Likely etiology from odontogenic infection. Neoplastic etiology was considered but less likely. Suspect infectious/ inflammatory given rapid progression of symptoms over the last 24hrs. He was intubated in the ED to protect the airway. He has been extubated and doing well on [place]. His mentation is at baseline.  He  received decadron 8 mg TID but that was discontinued due to altered mental status. Speech consulted for evaluation. Patient will benefit from dental evaluation as well.  - continue zosyn, will transition to oral once patient is cleared by speech.  - follow blood cultures; NGTD - Follow up speech eval - ENT available as needed.   HTN:  Home med is amlodipine-benzapril 10-20 mg qd. His home meds were held due to normotensive  Bps but will add home med today.  -Will restart home amlodipine-benazepril 10-20 mg qd.  Best Practice (right click and "Reselect all SmartList Selections" daily)  Diet/type: Tube feeds DVT prophylaxis: prophylactic heparin  GI prophylaxis: PPI Lines: N/A Foley:   N/A BM: 2 yesterday.  Continuous: TF Code Status:  full code Last date of multidisciplinary goals of care discussion [07/05 sisters updated at bedside] Labs: Leukocytosis stable at 15.3. Hgb at 13.5 with MCV 100.3. Normal renal fxn. Bicarb 22. Normal K, Na. NGTD on cultures.   CBC: Recent Labs  Lab 07/14/21 0727 07/14/21 1501 07/15/21 0542 07/16/21 0117 07/17/21 0343  WBC 11.6*  --  15.0* 16.6* 15.3*  NEUTROABS 8.7*  --   --   --   --   HGB 14.5 12.6* 12.5* 11.8* 13.5  HCT 44.5 37.0* 38.2* 35.7* 40.7  MCV 102.5*  --  100.0 100.3* 100.5*  PLT 244  --  236 210 185    Basic Metabolic Panel: Recent Labs  Lab 07/14/21 0727 07/14/21 1501 07/15/21 0542 07/15/21 0929 07/16/21 0117 07/17/21 0343  NA 136 136 137  --  140 141  K 3.9 4.1 3.8  --  4.3 4.1  CL 105  --  108  --  110 114*  CO2 25  --  20*  --  19* 22  GLUCOSE 126*  --  138*  --  131* 153*  BUN 8  --  13  --  23 25*  CREATININE 0.86  --  0.99  --  0.98 1.05  CALCIUM 9.8  --  9.3  --  9.3 9.5  MG  --   --   --  2.0  --   --     GFR: Estimated Creatinine Clearance: 59.8 mL/min (by C-G formula based on SCr of 1.05 mg/dL). Recent Labs  Lab 07/14/21 0727 07/14/21 0846 07/14/21 1121 07/15/21 0542 07/16/21 0117 07/17/21 0343  WBC 11.6*  --   --  15.0* 16.6* 15.3*  LATICACIDVEN  --  2.5* 1.6  --   --   --     Liver Function Tests: Recent Labs  Lab 07/14/21 0727  AST 14*  ALT 12  ALKPHOS 62  BILITOT 0.8  PROT 8.5*  ALBUMIN 4.3    No results for input(s): "LIPASE", "AMYLASE" in the last 168 hours. No results for input(s): "AMMONIA" in the last 168 hours. ABG    Component Value Date/Time   PHART 7.412 07/14/2021 1501   PCO2ART 39.4 07/14/2021 1501   PO2ART 284 (H) 07/14/2021 1501   HCO3 24.8 07/14/2021 1501   TCO2 26 07/14/2021 1501   O2SAT 100 07/14/2021 1501    Coagulation Profile: Recent Labs  Lab 07/14/21 0727  INR 1.0    Cardiac Enzymes: No results for input(s): "CKTOTAL", "CKMB",  "CKMBINDEX", "TROPONINI" in the last 168 hours. HbA1C: No results found for: "HGBA1C" CBG: Recent Labs  Lab 07/16/21 1110 07/16/21 1538 07/16/21 2000 07/16/21 2353 07/17/21 0358  GLUCAP 102* 98 152* 138* 111*    Past Medical History:  He,  has a past medical history of Arthritis, BPH (benign prostatic hyperplasia), HOH (hard of hearing), Hyperlipidemia (11/02/2017), Hypertension, Hypertension (11/02/2017), 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[place]), and Vitamin D deficiency (11/02/2017).  Surgical History:   Past Surgical History:  Procedure Laterality Date   COLONOSCOPY N/A 11/24/2012   Procedure: COLONOSCOPY;  Surgeon: Rogene Houston, MD;  [facility];  Service: Endoscopy;  Laterality: N/A;  830-moved to [place] notified pt  HEMORROIDECTOMY     KNEE ARTHROSCOPY WITH LATERAL MENISECTOMY Right 08/31/2017   Procedure: KNEE ARTHROSCOPY WITH LATERAL MENISECTOMY;  Surgeon: Carole Civil, MD;  Location: [facility] ORS;  Service: Orthopedics;  Laterality: Right;   LESION EXCISION N/A 03/23/2012   Procedure: EXCISION NEOPLASM SCALP ;  Surgeon: Jamesetta So, MD;  Location: [facility] ORS;  Service: General;  Laterality: N/A;  Excision of Scalp Neoplasm   ORCHIECTOMY Right 12/20/2012   Procedure: RIGHT RADICAL ORCHIECTOMY/POSSIBLE BX RIGHT TESTICLE;  Surgeon: Marissa Nestle, MD;  Location: [facility];  Service: Urology;  Laterality: Right;   PROSTATE SURGERY     SCALP LACERATION REPAIR     [facility]-Dr Tamala Julian    Social History:   reports that he has been smoking cigarettes. He has a 12.50 pack-year smoking history. He has never used smokeless tobacco. He reports that he does not currently use alcohol. He reports that he does not use drugs.  Family History:  His family history includes Cancer in his brother and brother.  Allergies No Known Allergies  Home Medications  Prior to Admission medications   Medication Sig Start Date End Date Taking? Authorizing Provider  amLODipine-benazepril (LOTREL) 10-40 MG  capsule Take 1 capsule by mouth daily. 05/07/21  Yes [provider]  Cholecalciferol (VITAMIN D3) 50000 units TABS Take 5,000 Units by mouth daily.   Yes [provider]  gabapentin (NEURONTIN) 300 MG capsule Take 300 mg by mouth 2 (two) times daily.   Yes [provider]  hydrALAZINE (APRESOLINE) 25 MG tablet Take 25 mg by mouth 2 (two) times daily. 05/08/21  Yes [provider]  magnesium oxide (MAG-OX) 400 MG tablet Take 400 mg by mouth daily.   Yes [provider]  oxyCODONE (OXY IR/ROXICODONE) 5 MG immediate release tablet Take 5 mg by mouth 3 (three) times daily as needed for pain. 07/13/17  Yes [provider]  pravastatin (PRAVACHOL) 40 MG tablet Take 80 mg by mouth daily.   Yes [provider]  RAPAFLO 8 MG CAPS capsule Take 8 mg by mouth daily.  10/13/13  Yes [provider]  vitamin B-12 (CYANOCOBALAMIN) 1000 MCG tablet Take 1,000 mcg by mouth daily.   Yes [provider]     Idamae Schuller, MD Tillie Rung. [facility] Internal Medicine Residency, PGY-2

## 2021-07-17 NOTE — Progress Notes (Signed)
Reached out to Maxillofacial Surgeon, Dr. Conley Simmonds, regarding this patient. Dr. Conley Simmonds reviewed the images and stated his swelling may be more likely from a necrotic node potentially from an underlying neoplastic process in the left submandibular gland. Less likely a dental source as there are no posterior teeth in the left side that would communicate with the swollen space in the neck. He recommends a new CT scan to re-evaluate and rule out an abscess.    Idamae Schuller, MD Tillie Rung. [facility] Internal Medicine Residency, PGY-1

## 2021-07-17 NOTE — Evaluation (Signed)
Clinical/Bedside Swallow Evaluation Patient Details  Name: Nathaniel Hicks MRN: [id number] Date of Birth: 1940/02/26  Today's Date: 07/17/2021 Time: SLP Start Time (ACUTE ONLY): 16 SLP Stop Time (ACUTE ONLY): 1150 SLP Time Calculation (min) (ACUTE ONLY): 20 min  Past Medical History:  Past Medical History:  Diagnosis Date   Arthritis    BPH (benign prostatic hyperplasia)    HOH (hard of hearing)    Hyperlipidemia 11/02/2017   Hypertension    Hypertension 11/02/2017   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[place])    2014   Vitamin D deficiency 11/02/2017   Past Surgical History:  Past Surgical History:  Procedure Laterality Date   COLONOSCOPY N/A 11/24/2012   Procedure: COLONOSCOPY;  Surgeon: Rogene Houston, MD;  Location: [facility];  Service: Endoscopy;  Laterality: N/A;  830-moved to [place] notified pt   HEMORROIDECTOMY     KNEE ARTHROSCOPY WITH LATERAL MENISECTOMY Right 08/31/2017   Procedure: KNEE ARTHROSCOPY WITH LATERAL MENISECTOMY;  Surgeon: Carole Civil, MD;  Location: [facility] ORS;  Service: Orthopedics;  Laterality: Right;   LESION EXCISION N/A 03/23/2012   Procedure: EXCISION NEOPLASM SCALP ;  Surgeon: Jamesetta So, MD;  Location: [facility] ORS;  Service: General;  Laterality: N/A;  Excision of Scalp Neoplasm   ORCHIECTOMY Right 12/20/2012   Procedure: RIGHT RADICAL ORCHIECTOMY/POSSIBLE BX RIGHT TESTICLE;  Surgeon: Marissa Nestle, MD;  Location: [facility] ORS;  Service: Urology;  Laterality: Right;   PROSTATE SURGERY     SCALP LACERATION REPAIR     [facility]-Dr Tamala Julian   HPI:  Patient is an 81 y.o. male with PMH: HTN, tobacco abuse, HLD, arthritis, BPH, testicular cancer s/p right radial orchiectomy, HOH. He presented to [facility] ER on 07/14/21 with left sided jaw swelling and odontogenic pain for one month with voice changes, dysphagia and poor PO intake for past 24 hours prior. In ER, temp was 99.9, patient was tachycardic, normotensive and oxygen saturations normal. He was noted to have a hot potato voice  and firm swelling under left mandible and sublingual areas. CXR negative. CT soft tissues noted soft tissue swelling in the left submandibular region, oropharynx including tongue base, supraglottic larynx and with probably extension into the floor of the mouth. Also noted enlarged contralateral right submandibular node and left submandibular gland; no abscess, and neoplastic etiology not excluded but appearance favors inflammatory etiology.  Concern for airway compromise and patient was electively intubated; transferred to [facility]. He was extubated on 07/16/21 at 1232 and placed on 10L salter. NG tube in place for nutrition.    Assessment / Plan / Recommendation  Clinical Impression  Patient presents with clinical s/s of dysphagia as per this bedside/clinical swallow evaluation with suspected cause being primarily from oropharyngeal and laryngeal swelling. He was intubated for 3 days but impact of this on his current swallow function appears mild. Patient reported that when he swallowed his saliva and when he swallowed water he felt a little soreness in his throat. No coughing, throat clearing or other overt s/s aspiration/penetration observed with multiple straw sips of thin liquids (water). When patient swallowing 1/2 spoon bites of puree (applesauce), he was noted to swallow multiple times and reported that it felt like there was "a lump" in his throat. Sips of water reportedly helped clear this "lump" feeling. SLP is recommending to initiate PO diet of clear liquids at this time and will follow patient for readiness to upgrade to solids. SLP Visit Diagnosis: Dysphagia, unspecified (R13.10)    Aspiration Risk  Mild aspiration  risk    Diet Recommendation Thin liquid   Medication Administration: Via alternative means Postural Changes: Seated upright at 90 degrees    Other  Recommendations Oral Care Recommendations: Oral care BID;Staff/trained caregiver to provide oral care    Recommendations for follow  up therapy are one component of a multi-disciplinary discharge planning process, led by the attending physician.  Recommendations may be updated based on patient status, additional functional criteria and insurance authorization.  Follow up Recommendations No SLP follow up      Assistance Recommended at Discharge Intermittent Supervision/Assistance  Functional Status Assessment Patient has had a recent decline in their functional status and demonstrates the ability to make significant improvements in function in a reasonable and predictable amount of time.  Frequency and Duration min 2x/week  1 week       Prognosis Prognosis for Safe Diet Advancement: Good      Swallow Study   General Date of Onset: 07/14/21 HPI: Patient is an 81 y.o. male with PMH: HTN, tobacco abuse, HLD, arthritis, BPH, testicular cancer s/p right radial orchiectomy, HOH. He presented to [facility] ER on 07/14/21 with left sided jaw swelling and odontogenic pain for one month with voice changes, dysphagia and poor PO intake for past 24 hours prior. In ER, temp was 99.9, patient was tachycardic, normotensive and oxygen saturations normal. He was noted to have a hot potato voice and firm swelling under left mandible and sublingual areas. CXR negative. CT soft tissues noted soft tissue swelling in the left submandibular region, oropharynx including tongue base, supraglottic larynx and with probably extension into the floor of the mouth. Also noted enlarged contralateral right submandibular node and left submandibular gland; no abscess, and neoplastic etiology not excluded but appearance favors inflammatory etiology.  Concern for airway compromise and patient was electively intubated; transferred to [facility]. He was extubated on 07/16/21 at 1232 and placed on 10L salter. NG tube in place for nutrition. Type of Study: Bedside Swallow Evaluation Previous Swallow Assessment: none found Diet Prior to this Study: NPO Temperature Spikes Noted: Yes  (101.5) Respiratory Status: Nasal cannula History of Recent Intubation: Yes Length of Intubations (days): 3 days Date extubated: 07/16/21 Behavior/Cognition: Alert;Cooperative;Pleasant mood Oral Cavity Assessment: Within Functional Limits Oral Care Completed by SLP: Yes Oral Cavity - Dentition: Adequate natural dentition;Missing dentition Self-Feeding Abilities: Total assist Patient Positioning: Upright in bed Baseline Vocal Quality: Hoarse;Other (comment) (mildly hoarse but vocal intensity WFL) Volitional Cough: Strong Volitional Swallow: Able to elicit    Oral/Motor/Sensory Function Overall Oral Motor/Sensory Function: Other (comment) (small swelling/lump under mandible bilaterally)   Ice Chips     Thin Liquid Thin Liquid: Impaired Presentation: Straw Pharyngeal  Phase Impairments: Other (comments) Other Comments: patient complains of mild soreness when swallowing, no overt s/s aspiration or penetration observed    Nectar Thick     Honey Thick     Puree Puree: Impaired Pharyngeal Phase Impairments: Multiple swallows Other Comments: patient reports feeling like there was "a lump" in throat after bite of applesauce. Sips of water helped clear   Solid     Solid: Not tested     Sonia Baller, MA, CCC-SLP Speech Therapy

## 2021-07-17 NOTE — Progress Notes (Signed)
Nutrition Follow-up  DOCUMENTATION CODES:   Not applicable  INTERVENTION:  - Continue tube feeding via NG tube: Osmolite 1.5 at 60 ml/h (1440 ml per day) Prosource TF 45 ml TID   - Provides 2280 kcal, 123 gm protein, 1097 ml free water daily.  - Monitor for diet advancement  NUTRITION DIAGNOSIS:   Inadequate oral intake related to inability to eat as evidenced by NPO status.  Ongoing  GOAL:   Patient will meet greater than or equal to 90% of their needs  Goal met via tube feeding  MONITOR:   Vent status, TF tolerance, Labs  REASON FOR ASSESSMENT:   Ventilator, Consult Enteral/tube feeding initiation and management  ASSESSMENT:   81 yo male admitted with facial swelling. PMH includes tobacco abuse, HTN, HLD, arthritis, BPH, testicular cancer, hearing loss.  07/03-intubated 07/04-TF started 07/05-extubated; continue TF  Spoke with pt and his 2 sisters present at bedside. He endorses continued jaw pain. His sister states that the swelling has decreased significantly since admission.   His stomach appears more full and distended. His sisters report that this is his usual but it appears slightly more rounded today. RN at bedside reports pt having difficulty with constipation, treated with bowel regimen and has noted improvement.   SLP following. Recommend diet advancement to clear liquids at this time.  Tube feeding infusing at goal rate of 60 ml/hr. Would recommend continuing tube feeding at goal rate until pt is able to obtain adequate PO intake.   Wt remains stable at 83.8 kg (185 lbs).  Medications: colace, SSI 0-9 units Q4h, protonix, miralax, senna IV drips: zosyn  Labs: CBG's 98-152 x24 hours  I/O's: +4561ml since admission  Diet Order:   Diet Order             Diet NPO time specified  Diet effective now                   EDUCATION NEEDS:   No education needs have been identified at this time  Skin:  Skin Assessment: Reviewed RN  Assessment  Last BM:  7/6 (type 6; type 1; type 7)  Height:   Ht Readings from Last 1 Encounters:  07/14/21 $RemoveB'5\' 11"'EbKtmZcB$  (1.803 m)    Weight:   Wt Readings from Last 1 Encounters:  07/17/21 83.8 kg   BMI:  Body mass index is 25.77 kg/m.  Estimated Nutritional Needs:   Kcal:  2100-2300  Protein:  115-130 gm  Fluid:  2.1-2.3 L  Clayborne Dana, RDN, LDN Clinical Nutrition

## 2021-07-17 NOTE — TOC Progression Note (Signed)
Transition of Care [facility]) - Progression Note    Patient Details  Name: Nathaniel Hicks MRN: [id number] Date of Birth: 1940/10/08  Transition of Care [facility]) CM/SW [place], RN Phone Number:[phone]  07/17/2021, 4:27 PM  Clinical Narrative:     Transition of Care Department [facility]) has reviewed patient and no TOC needs have been identified at this time. We will continue to monitor patient advancement through interdisciplinary progression rounds.        Expected Discharge Plan and Services                                                 Social Determinants of Health (SDOH) Interventions    Readmission Risk Interventions     No data to display

## 2021-07-18 ENCOUNTER — Inpatient Hospital Stay (HOSPITAL_COMMUNITY): Payer: Medicare Other

## 2021-07-18 DIAGNOSIS — R220 Localized swelling, mass and lump, head: Secondary | ICD-10-CM | POA: Diagnosis not present

## 2021-07-18 LAB — CBC
HCT: 42.3 % (ref 39.0–52.0)
Hemoglobin: 13.8 g/dL (ref 13.0–17.0)
MCH: 32.7 pg (ref 26.0–34.0)
MCHC: 32.6 g/dL (ref 30.0–36.0)
MCV: 100.2 fL — ABNORMAL HIGH (ref 80.0–100.0)
Platelets: 279 10*3/uL (ref 150–400)
RBC: 4.22 MIL/uL (ref 4.22–5.81)
RDW: 13.5 % (ref 11.5–15.5)
WBC: 12.1 10*3/uL — ABNORMAL HIGH (ref 4.0–10.5)
nRBC: 0.2 % (ref 0.0–0.2)

## 2021-07-18 LAB — GLUCOSE, CAPILLARY
Glucose-Capillary: 141 mg/dL — ABNORMAL HIGH (ref 70–99)
Glucose-Capillary: 125 mg/dL — ABNORMAL HIGH (ref 70–99)
Glucose-Capillary: 138 mg/dL — ABNORMAL HIGH (ref 70–99)
Glucose-Capillary: 125 mg/dL — ABNORMAL HIGH (ref 70–99)
Glucose-Capillary: 122 mg/dL — ABNORMAL HIGH (ref 70–99)

## 2021-07-18 LAB — BASIC METABOLIC PANEL
Anion gap: 9 (ref 5–15)
BUN: 15 mg/dL (ref 8–23)
CO2: 25 mmol/L (ref 22–32)
Calcium: 9.6 mg/dL (ref 8.9–10.3)
Chloride: 110 mmol/L (ref 98–111)
Creatinine, Ser: 0.83 mg/dL (ref 0.61–1.24)
GFR, Estimated: >60 mL/min (ref >60)
Glucose, Bld: 168 mg/dL — ABNORMAL HIGH (ref 70–99)
Potassium: 3.5 mmol/L (ref 3.5–5.1)
Sodium: 144 mmol/L (ref 135–145)

## 2021-07-18 LAB — TRIGLYCERIDES: Triglycerides: 71 mg/dL (ref <150)

## 2021-07-18 MED ORDER — IOHEXOL 300 MG/ML  SOLN
75.0000 mL | Freq: Once | INTRAMUSCULAR | Status: AC | PRN
  Administered 2021-07-18: 75 mL via INTRAVENOUS

## 2021-07-18 MED ORDER — ONDANSETRON HCL 4 MG/2ML IJ SOLN
4.0000 mg | Freq: Four times a day (QID) | INTRAMUSCULAR | Status: DC | PRN
Start: 2021-07-18 — End: 2021-07-23
  Administered 2021-07-18: 4 mg via INTRAVENOUS
  Filled 2021-07-18: qty 2

## 2021-07-18 MED ORDER — TAMSULOSIN HCL 0.4 MG PO CAPS
0.4000 mg | ORAL_CAPSULE | Freq: Every day | ORAL | Status: DC
  Administered 2021-07-19 – 2021-07-22 (×4): 0.4 mg via ORAL
  Filled 2021-07-18 (×5): qty 1

## 2021-07-18 MED ORDER — LACTATED RINGERS IV SOLN: INTRAVENOUS | Status: DC

## 2021-07-18 MED ORDER — LACTATED RINGERS IV SOLN: INTRAVENOUS | Status: AC

## 2021-07-18 MED ORDER — PRAVASTATIN SODIUM 40 MG PO TABS
80.0000 mg | ORAL_TABLET | Freq: Every day | ORAL | Status: DC
  Administered 2021-07-18 – 2021-07-22 (×5): 80 mg via ORAL
  Filled 2021-07-18 (×5): qty 2

## 2021-07-18 MED ORDER — MAGNESIUM OXIDE -MG SUPPLEMENT 400 (240 MG) MG PO TABS
400.0000 mg | ORAL_TABLET | Freq: Every day | ORAL | Status: DC
  Administered 2021-07-18 – 2021-07-19 (×2): 400 mg via ORAL
  Filled 2021-07-18 (×2): qty 1

## 2021-07-18 MED ORDER — SODIUM CHLORIDE 0.9 % IV SOLN: 6.2500 mg | Freq: Four times a day (QID) | INTRAVENOUS | Status: DC | PRN

## 2021-07-18 MED ORDER — HYDRALAZINE HCL 50 MG PO TABS
50.0000 mg | ORAL_TABLET | Freq: Two times a day (BID) | ORAL | Status: DC
  Administered 2021-07-18 – 2021-07-22 (×8): 50 mg via ORAL
  Filled 2021-07-18 (×8): qty 1

## 2021-07-18 MED ORDER — POTASSIUM CHLORIDE 20 MEQ PO PACK
40.0000 meq | PACK | Freq: Once | ORAL | Status: AC
  Administered 2021-07-18: 40 meq via ORAL
  Filled 2021-07-18: qty 2

## 2021-07-18 MED ORDER — VITAMIN B-12 1000 MCG PO TABS
1000.0000 ug | ORAL_TABLET | Freq: Every day | ORAL | Status: DC
  Administered 2021-07-19 – 2021-07-23 (×5): 1000 ug via ORAL
  Filled 2021-07-18 (×5): qty 1

## 2021-07-18 NOTE — Evaluation (Signed)
Occupational Therapy Evaluation Patient Details Name: Nathaniel Hicks MRN: [id number] DOB: March 22, 1940 Today's Date: 07/18/2021   History of Present Illness 81 yo male who was admitted on 7/3 to [facility] Emergency was found to have L side facial swelling and pain from a month long history that stared with odontogenic pain.  Developed swallowing issues with voice changes and was preventatively intubated for airway protection.  Now is thought to have necrotic tissue in L submandibular gland.  On NG tube to decompress, being evaluated to see if abscess is present.  PMHx:  HTN, tobacco use, HLD, OA, testicular CA with R radial orchiectomy, HOH, BPH   Clinical Impression   Pt was living with his wife and ambulating without AD as well as completing ADLs independently, but had been getting increasing weaker prior to admission. Pt with NGT partially dislodged upon OT's arrival, RN notified and immediately replace and secured. Pt requiring moderate assist for bed mobility. He demonstrates fair sitting balance and participated in oral care at EOB. Pt unable to stand with one person assist from elevated bed. Pt will need post acute rehab in SNF prior to return home. Will follow acutely.      Recommendations for follow up therapy are one component of a multi-disciplinary discharge planning process, led by the attending physician.  Recommendations may be updated based on patient status, additional functional criteria and insurance authorization.   Follow Up Recommendations  Skilled nursing-short term rehab (<3 hours/day)    Assistance Recommended at Discharge Frequent or constant Supervision/Assistance  Patient can return home with the following Two people to help with walking and/or transfers;A lot of help with bathing/dressing/bathroom;Assistance with cooking/housework;Direct supervision/assist for medications management;Direct supervision/assist for financial management;Assist for transportation;Help with stairs or  ramp for entrance    Functional Status Assessment  Patient has had a recent decline in their functional status and demonstrates the ability to make significant improvements in function in a reasonable and predictable amount of time.  Equipment Recommendations  BSC/3in1    Recommendations for Other Services       Precautions / Restrictions Precautions Precautions: Fall Precaution Comments: NGT off suction Restrictions Weight Bearing Restrictions: No      Mobility Bed Mobility Overal bed mobility: Needs Assistance Bed Mobility: Rolling, Sidelying to Sit, Sit to Sidelying Rolling: Mod assist Sidelying to sit: Mod assist     Sit to sidelying: Mod assist General bed mobility comments: cues for log roll technique, assist to raise trunk and for LEs back into bed    Transfers Overall transfer level: Needs assistance Equipment used: 1 person hand held assist Transfers: Sit to/from Stand             General transfer comment: unable to stand with one person assist from elevated bed      Balance Overall balance assessment: Needs assistance Sitting-balance support: Feet supported Sitting balance-Leahy Scale: Fair Sitting balance - Comments: min guard assist     Standing balance-Leahy Scale: Zero                             ADL either performed or assessed with clinical judgement   ADL Overall ADL's : Needs assistance/impaired Eating/Feeding: NPO   Grooming: Oral care;Sitting;Min guard Grooming Details (indicate cue type and reason): oral swab with mouth moisturizer Upper Body Bathing: Moderate assistance;Sitting   Lower Body Bathing: Total assistance;Sit to/from stand   Upper Body Dressing : Moderate assistance;Sitting   Lower Body Dressing: Total  assistance;Sit to/from stand                       Vision Ability to See in Adequate Light: 0 Adequate Patient Visual Report: No change from baseline       Perception     Praxis       Pertinent Vitals/Pain Pain Assessment Pain Assessment: Faces Faces Pain Scale: Hurts little more Pain Location: mouth/throat Pain Descriptors / Indicators: Discomfort Pain Intervention(s): Monitored during session     Hand Dominance Right   Extremity/Trunk Assessment Upper Extremity Assessment Upper Extremity Assessment: Generalized weakness   Lower Extremity Assessment Lower Extremity Assessment: Defer to PT evaluation   Cervical / Trunk Assessment Cervical / Trunk Assessment: Other exceptions Cervical / Trunk Exceptions: weakness   Communication Communication Communication: HOH   Cognition Arousal/Alertness: Awake/alert Behavior During Therapy: Flat affect Overall Cognitive Status: Within Functional Limits for tasks assessed                                 General Comments: communicating needs, able to offer PLOF and home set up, name family members in room, oriented     General Comments  Pt is weak, able to maintain O2 sats on cannula and has normal HR but is quite tired from recent medical complications including now on NG tube    Exercises     Shoulder Instructions      Home Living Family/patient expects to be discharged to:: Private residence Living Arrangements: Spouse/significant other Available Help at Discharge: Family;Available 24 hours/day Type of Home: Apartment Home Access: Elevator;Level entry     Home Layout: One level     Bathroom Shower/Tub: Teacher, early years/pre: Standard     Home Equipment: Conservation officer, nature (2 wheels);Cane - single point   Additional Comments: has been independent with no AD needed, no longer drives      Prior Functioning/Environment Prior Level of Function : Independent/Modified Independent             Mobility Comments: no AD needed for any gait distances ADLs Comments: stands to shower, does not drive        OT Problem List: Decreased activity tolerance;Decreased  strength;Decreased knowledge of use of DME or AE;Pain      OT Treatment/Interventions: Self-care/ADL training;DME and/or AE instruction;Therapeutic activities;Patient/family education;Balance training    OT Goals(Current goals can be found in the care plan section) Acute Rehab OT Goals OT Goal Formulation: With patient Time For Goal Achievement: 08/01/21 Potential to Achieve Goals: Good ADL Goals Pt Will Perform Eating: with set-up;sitting (when cleared for PO) Pt Will Perform Grooming: with set-up Pt Will Perform Upper Body Bathing: with min assist;sitting Pt Will Perform Upper Body Dressing: with set-up;sitting Pt Will Transfer to Toilet: with mod assist;stand pivot transfer;bedside commode Additional ADL Goal #1: Pt will perform bed mobility with min assist in preparation for ADLs.  OT Frequency: Min 2X/week    Co-evaluation              AM-PAC OT "6 Clicks" Daily Activity     Outcome Measure Help from another person eating meals?: Total Help from another person taking care of personal grooming?: A Little Help from another person toileting, which includes using toliet, bedpan, or urinal?: Total Help from another person bathing (including washing, rinsing, drying)?: A Lot Help from another person to put on and taking off regular upper body clothing?: A  Lot Help from another person to put on and taking off regular lower body clothing?: Total 6 Click Score: 10   End of Session Nurse Communication: Other (comment) (RN repositioned NGT)  Activity Tolerance: Patient limited by pain;Patient limited by fatigue Patient left: in bed;with call bell/phone within reach;with bed alarm set;with family/visitor present  OT Visit Diagnosis: Muscle weakness (generalized) (M62.81);Pain                Time: 5694-3700 OT Time Calculation (min): 17 min Charges:  OT General Charges $OT Visit: 1 Visit OT Evaluation $OT Eval Moderate Complexity: [place], OTR/L Acute Rehabilitation  Services Office: [phone]   Malka So 07/18/2021, 2:59 PM

## 2021-07-18 NOTE — Progress Notes (Signed)
Patient has had several episodes of vomiting since being transferred to unit. Patient was getting tube feedings that have been held out of concern for aspiration. Patient's VSS but he complains of generally not feeling well. Abdomen is distended with hyperactive bowel sounds.  Provider notified of RN's concerns-see orders.

## 2021-07-18 NOTE — Consult Note (Signed)
Reason for Consult: Submandibular gland infection Referring Physician: Hospitalist  Nathaniel Hicks is an 81 y.o. male.  HPI: 81 year old male recently admitted for submandibular swelling and pain resulting in need for intubation and mechanical ventilatory support along with antibiotic therapy.  He has been extubated and seems to be responding well to medical therapy.  Consult was requested due to CT findings.  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   Vitamin D deficiency 11/02/2017    Past Surgical History:  Procedure Laterality Date   COLONOSCOPY N/A 11/24/2012   Procedure: COLONOSCOPY;  Surgeon: Rogene Houston, MD;  Location: [facility];  Service: Endoscopy;  Laterality: N/A;  830-moved to [place] notified pt   HEMORROIDECTOMY     KNEE ARTHROSCOPY WITH LATERAL MENISECTOMY Right 08/31/2017   Procedure: KNEE ARTHROSCOPY WITH LATERAL MENISECTOMY;  Surgeon: Carole Civil, MD;  Location: [facility] ORS;  Service: Orthopedics;  Laterality: Right;   LESION EXCISION N/A 03/23/2012   Procedure: EXCISION NEOPLASM SCALP ;  Surgeon: Jamesetta So, MD;  Location: [facility] ORS;  Service: General;  Laterality: N/A;  Excision of Scalp Neoplasm   ORCHIECTOMY Right 12/20/2012   Procedure: RIGHT RADICAL ORCHIECTOMY/POSSIBLE BX RIGHT TESTICLE;  Surgeon: Marissa Nestle, MD;  Location: [facility] OR[facility];  Service: Urology;  Laterality: Right;   PROSTATE SURGERY     SCALP LACERATION REPAIR     [facility]-Dr Tamala Julian    Family History  Problem Relation Age of Onset   Cancer Brother    Cancer Brother     Social History:  reports that he has been smoking cigarettes. He has a 12.50 pack-year smoking history. He has never used smokeless tobacco. He reports that he does not currently use alcohol. He reports that he does not use drugs.  Allergies:  No Known Allergies  Medications: I have reviewed the patient's current medications.  Results for orders placed or performed during the hospital encounter of 07/14/21 (from the past 48 hour(s))  Glucose, capillary     Status: Abnormal   Collection Time: 07/16/21  8:00 PM  Result Value Ref Range   Glucose-Capillary 152 (H) 70 - 99 mg/dL    Comment: Glucose reference range applies only to samples taken after fasting for at least 8 hours.  Glucose, capillary     Status: Abnormal   Collection Time: 07/16/21 11:53 PM  Result Value Ref Range   Glucose-Capillary 138 (H) 70 - 99 mg/dL    Comment: Glucose reference range applies only to samples taken after fasting for at least 8 hours.  Basic metabolic panel     Status: Abnormal   Collection Time: 07/17/21  3:43 AM  Result Value Ref Range   Sodium 141 135 - 145 mmol/L   Potassium 4.1 3.5 - 5.1 mmol/L   Chloride 114 (H) 98 - 111 mmol/L   CO2 22 22 - 32 mmol/L   Glucose, Bld 153 (H) 70 - 99 mg/dL    Comment: Glucose reference range applies only to samples taken after fasting for at least 8 hours.   BUN 25 (H) 8 - 23 mg/dL   Creatinine, Ser 1.05 0.61 - 1.24 mg/dL   Calcium 9.5 8.9 - 10.3 mg/dL   GFR, Estimated >60 >60 mL/min    Comment: (NOTE) Calculated  using the CKD-EPI Creatinine Equation (2021)    Anion gap 5 5 - 15    Comment: Performed at [facility], [place] [address] [place], [place] [address]  CBC     Status: Abnormal   Collection Time: 07/17/21  3:43 AM  Result Value Ref Range   WBC 15.3 (H) 4.0 - 10.5 K/uL   RBC 4.05 (L) 4.22 - 5.81 MIL/uL   Hemoglobin 13.5 13.0 - 17.0 g/dL   HCT 40.7 39.0 - 52.0 %   MCV 100.5 (H) 80.0 - 100.0 fL   MCH 33.3 26.0 - 34.0 pg   MCHC 33.2 30.0 - 36.0 g/dL   RDW 13.4 11.5 - 15.5 %   Platelets 272 150 - 400 K/uL   nRBC 0.0 0.0 - 0.2 %    Comment: Performed at [facility], [place] [address] [place], [place] [address]  Glucose, capillary     Status: Abnormal   Collection Time: 07/17/21   3:58 AM  Result Value Ref Range   Glucose-Capillary 111 (H) 70 - 99 mg/dL    Comment: Glucose reference range applies only to samples taken after fasting for at least 8 hours.  Glucose, capillary     Status: Abnormal   Collection Time: 07/17/21  7:54 AM  Result Value Ref Range   Glucose-Capillary 146 (H) 70 - 99 mg/dL    Comment: Glucose reference range applies only to samples taken after fasting for at least 8 hours.  Glucose, capillary     Status: Abnormal   Collection Time: 07/17/21 11:31 AM  Result Value Ref Range   Glucose-Capillary 128 (H) 70 - 99 mg/dL    Comment: Glucose reference range applies only to samples taken after fasting for at least 8 hours.  Glucose, capillary     Status: Abnormal   Collection Time: 07/17/21  3:53 PM  Result Value Ref Range   Glucose-Capillary 130 (H) 70 - 99 mg/dL    Comment: Glucose reference range applies only to samples taken after fasting for at least 8 hours.  Glucose, capillary     Status: Abnormal   Collection Time: 07/17/21  7:49 PM  Result Value Ref Range   Glucose-Capillary 144 (H) 70 - 99 mg/dL    Comment: Glucose reference range applies only to samples taken after fasting for at least 8 hours.  Glucose, capillary     Status: Abnormal   Collection Time: 07/17/21 11:29 PM  Result Value Ref Range   Glucose-Capillary 125 (H) 70 - 99 mg/dL    Comment: Glucose reference range applies only to samples taken after fasting for at least 8 hours.  Triglycerides     Status: None   Collection Time: 07/18/21 12:44 AM  Result Value Ref Range   Triglycerides 71 <150 mg/dL    Comment: Performed at [facility] [address] [place], [place] [address]  Basic metabolic panel     Status: Abnormal   Collection Time: 07/18/21 12:44 AM  Result Value Ref Range   Sodium 144 135 - 145 mmol/L   Potassium 3.5 3.5 - 5.1 mmol/L   Chloride 110 98 - 111 mmol/L   CO2 25 22 - 32 mmol/L   Glucose, Bld 168 (H) 70 - 99 mg/dL    Comment: Glucose reference  range applies only to samples taken after fasting for at least 8 hours.   BUN 15 8 - 23 mg/dL   Creatinine, Ser 0.83 0.61 - 1.24 mg/dL   Calcium 9.6  8.9 - 10.3 mg/dL   GFR, Estimated >60 >60 mL/min    Comment: (NOTE) Calculated using the CKD-EPI Creatinine Equation (2021)    Anion gap 9 5 - 15    Comment: Performed at [facility], [place] [address], [place], [place] [address]  CBC     Status: Abnormal   Collection Time: 07/18/21 12:44 AM  Result Value Ref Range   WBC 12.1 (H) 4.0 - 10.5 K/uL   RBC 4.22 4.22 - 5.81 MIL/uL   Hemoglobin 13.8 13.0 - 17.0 g/dL   HCT 42.3 39.0 - 52.0 %   MCV 100.2 (H) 80.0 - 100.0 fL   MCH 32.7 26.0 - 34.0 pg   MCHC 32.6 30.0 - 36.0 g/dL   RDW 13.5 11.5 - 15.5 %   Platelets 279 150 - 400 K/uL   nRBC 0.2 0.0 - 0.2 %    Comment: Performed at [facility], [place] [address], [place], [place] [address]  Glucose, capillary     Status: Abnormal   Collection Time: 07/18/21  3:44 AM  Result Value Ref Range   Glucose-Capillary 141 (H) 70 - 99 mg/dL    Comment: Glucose reference range applies only to samples taken after fasting for at least 8 hours.  Glucose, capillary     Status: Abnormal   Collection Time: 07/18/21  9:09 AM  Result Value Ref Range   Glucose-Capillary 125 (H) 70 - 99 mg/dL    Comment: Glucose reference range applies only to samples taken after fasting for at least 8 hours.  Glucose, capillary     Status: Abnormal   Collection Time: 07/18/21 12:14 PM  Result Value Ref Range   Glucose-Capillary 138 (H) 70 - 99 mg/dL    Comment: Glucose reference range applies only to samples taken after fasting for at least 8 hours.  Glucose, capillary     Status: Abnormal   Collection Time: 07/18/21  4:53 PM  Result Value Ref Range   Glucose-Capillary 125 (H) 70 - 99 mg/dL    Comment: Glucose reference range applies only to samples taken after fasting for at least 8 hours.    CT MAXILLOFACIAL W CONTRAST  Result Date: 07/18/2021 CLINICAL DATA:   Sublingual/submandibular abscess EXAM: CT MAXILLOFACIAL WITH CONTRAST TECHNIQUE: Multidetector CT imaging of the maxillofacial structures was performed with intravenous contrast. Multiplanar CT image reconstructions were also generated. RADIATION DOSE REDUCTION: This exam was performed according to the departmental dose-optimization program which includes automated exposure control, adjustment of the mA and/or kV according to patient size and/or use of iterative reconstruction technique. CONTRAST:  39m OMNIPAQUE IOHEXOL 300 MG/ML  SOLN COMPARISON:  CT neck 07/14/2021 FINDINGS: Osseous: Temporomandibular joints are unremarkable. Markedly decayed presumed right mandibular molar. Cervical spine degenerative changes. Orbits: Partially included.  Bilateral lens replacements. Sinuses: Minor mucosal thickening. Soft tissues: Increased dilatation of left submandibular duct ([name]. Similar mild dilatation of the left intraglandular ducts. No stone. Decreased inflammatory changes in the left submandibular space. Unchanged size of enlarged right submandibular node with mild heterogeneity measuring 1.7 cm in short axis. Nasogastric tube is present. Limited intracranial: No abnormal enhancement. IMPRESSION: Decreased left submandibular inflammatory changes. Increased dilatation of the left submandibular duct ([name] again without a stone. An occult lesion at the orifice is not excluded. Stable indeterminate enlarged right submandibular node. Of potential relevance, there is a markedly decayed presumed right mandibular molar. Electronically Signed   By: PMacy MisM.D.   On: 07/18/2021 09:15   DG Abd Portable  1V  Result Date: 07/18/2021 CLINICAL DATA:  Abdominal distension EXAM: PORTABLE ABDOMEN - 1 VIEW COMPARISON:  07/15/2021 FINDINGS: Gastric catheter extends into the stomach. Scattered large and small bowel gas is again identified. No free air is seen. No acute bony abnormality is noted. IMPRESSION: Gastric  catheter within the stomach. No obstructive changes are seen. Electronically Signed   By: Inez Catalina M.D.   On: 07/18/2021 03:30    Review of Systems  Unable to perform ROS: Mental status change   Blood pressure (!) 142/63, pulse 84, temperature 99.2 F (37.3 C), temperature source Oral, resp. rate 20, height '5\' 11"'$  (1.803 m), weight 83.6 kg, SpO2 97 %. Physical Exam Constitutional:      Appearance: Normal appearance. He is normal weight.  HENT:     Head: Normocephalic and atraumatic.     Right Ear: External ear normal.     Left Ear: External ear normal.     Nose: Nose normal.     Mouth/Throat:     Mouth: Mucous membranes are moist.     Comments: Ulcerative mass involving floor of mouth, crossing midline a bit more toward the right side. Eyes:     Extraocular Movements: Extraocular movements intact.     Conjunctiva/sclera: Conjunctivae normal.     Pupils: Pupils are equal, round, and reactive to light.  Neck:     Comments: Left submandibular gland firmness and tenderness. Cardiovascular:     Rate and Rhythm: Normal rate.  Pulmonary:     Effort: Pulmonary effort is normal.  Skin:    General: Skin is warm and dry.  Neurological:     General: No focal deficit present.     Mental Status: He is alert.  Psychiatric:        Mood and Affect: Mood normal.     Assessment/Plan: Submandibular sialoadenitis, obstructive submandibular duct, and floor of mouth cancer  I personally reviewed his maxillofacial CT demonstrating a markedly dilated left submandibular duct, enlarged nodes, and inflammation of the left submandibular gland.  On exam, he has an ulcerative mass of the floor of mouth concerning for cancer and accounting for his obstructive salivary duct and resulting infection.  I performed a biopsy at bedside that included opening the submandibular duct with pus flowing.  See procedure note.  Continue antibiotic therapy for the time-being.  Will follow.  Melida Quitter 07/18/2021,  7:00 PM

## 2021-07-18 NOTE — TOC Initial Note (Signed)
Transition of Care [facility]) - Initial/Assessment Note    Patient Details  Name: Nathaniel Hicks MRN: [id number] Date of Birth: Nov 23, 1940  [facility]) CM/SW Contact:    Benard Halsted, LCSW Phone Number: 07/18/2021, 3:33 PM  Clinical Narrative:                 CSW received consult for possible SNF placement at time of discharge. CSW spoke with patient and his sister, Nathaniel Hicks, at bedside. Patient's sister assisting in feeding patient soup so patient is quiet but sister reported that she is hopeful for patient to return home with home health and family support as long as patient can walk. Patient stated he has a walker at home. CSW confirmed PCP and address. Nathaniel Hicks reported that she lives in [place] but their other sister [name] lives closer. No further questions reported at this time.   Skilled Nursing Rehab Facilities-   [email or web address]   Ratings out of 5 possible   Name Address  Phone # [place] Inspection Overall  [facility] [address], [place] '4 5 2 3  '$ [facility]  [address], [place] [phone] '3 2 5 5  '$ [facility] [place], [place] '3 1 1 1  '$ [facility], [place] '3 2 4 4  '$ [facility] [address], [place] '1 1 2 1  '$ [place] [facility] [place] '2 1 4 3  '$ [facility] [address] [place] '5 2 3 4  '$ [facility] [address] [place] '5 2 2 3  '$ [address] ([place]) [place], [address] [phone] '5 1 2 2  '$ [facility] [phone] [address], [address] [phone] '4 1 2 1  '$ [facility] [address] [facility] [phone] '4 1 2 1  '$ [facility] ([place]) [place]. [address], [place] [phone] '4 1 1 1  '$ [place] [address]  '3 2 4 4          '$ [place], [place]      [facility] [place] '4 2 3 3  '$ [facility] [place] [address] [place] '4 1 5 4  '$ [facility], [place] [address], [place] [phone] '2 1 1 1  '$ [facility] [address], [facility] [phone] '2 1 3 2          '$ [address] (no [facility]) [facility] [address], [place] [phone] '4 5 5 5  '$ [facility] (No [employer]) [address], [place] '3 1 4 3  '$ [facility] (No UHC) [place], [place] [phone] '5 5 5 5  '$ [facility] [address], [facility] [phone] '3 2 4 4  '$ [facility] [address] [place] '1 1 2 1  '$ [facility] [address], [address] '2 1 1 1  '$ [place], [place] '5 2 4 5  '$ [facility] [address] [place] '3 1 1 1  '$ [facility] [place], [place] '2 1 2 1          '$ [facility] [address], [place] '1 1 1 1  '$ [place] [address], [address]  [phone] '2 4 2 2  '$ [facility] [place] [address], [phone] 5 2  [place], [place] '2 1 1 1  '$ [place] (No Humana) [address], [place] [phone] '2 1 3 2  '$ [facility] [address] [place] [phone] '3 1 1 1          '$ [facility] [place], [place] '5 4 5 5  '$ [facility])  [facility] [address], [place] '2 2 3 3  '$ [facility]) [place] [address] [place] '3 2 4 4  '$ [place] [address], [place] '4 3 4 4  '$ [address] [place], [place] '3 3 1 1  '$ [address] [facility] [place]) [address] [place] [phone] '2 2 4 4     '$ Expected  Discharge Plan: [place] Barriers to Discharge: Continued Medical Work up   Patient Goals and CMS Choice Patient states their goals for this hospitalization and ongoing recovery are:: Rehab CMS Medicare.gov Compare Post Acute Care list provided to:: Patient Represenative (must comment) Choice offered to / list presented to : Sibling  Expected Discharge Plan and Services Expected Discharge Plan: [place] In-house Referral: Clinical Social Work Discharge Planning Services: CM Consult Post Acute Care Choice: [place] arrangements for the past 2 months: Apartment                                      Prior Living Arrangements/Services Living arrangements for the past 2 months: Apartment Lives with:: Self Patient language and need for interpreter reviewed:: Yes Do you feel safe going back to the place where you live?: Yes      Need for Family Participation in Patient Care: Yes (Comment) Care giver support system in place?: Yes (comment) Current home services: DME (walker) Criminal Activity/Legal Involvement Pertinent to Current Situation/Hospitalization: No - Comment as needed  Activities of Daily Living      Permission Sought/Granted Permission sought to share information with : Facility [phone], Family Supports Permission granted to share information with : Yes, Verbal Permission Granted  Share Information with NAME: Sport and exercise psychologist  Permission granted to share info w AGENCY: [place] granted to share info w Relationship: Sister  Permission granted to share info w Contact Information: [phone]  Emotional Assessment Appearance:: Appears stated age Attitude/Demeanor/Rapport: Engaged Affect (typically observed): Quiet Orientation: : Oriented to Self, Oriented to Place, Oriented to  Time, Oriented to Situation Alcohol / Substance Use: Not Applicable Psych Involvement: No (comment)  Admission diagnosis:   Left facial swelling [R22.0] Patient Active Problem List   Diagnosis Date Noted   Left facial swelling 07/14/2021   BPH with obstruction/lower urinary tract symptoms 11/05/2020   History of primary testicular cancer; Stage 1b seminoma; 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 [address] [place] [address] [place] Phone: [phone] Fax: [phone]     Social Determinants of Health (SDOH) Interventions    Readmission Risk Interventions  No data to display

## 2021-07-18 NOTE — Care Management Important Message (Signed)
Important Message  Patient Details  Name: Nathaniel Hicks MRN: [id number] Date of Birth: 1940/09/07   Medicare Important Message Given:  Yes     Bettyann Birchler 07/18/2021, 3:10 PM

## 2021-07-18 NOTE — Progress Notes (Signed)
PROGRESS NOTE                                                                                                                                                                                                             Patient Demographics:    Nathaniel Hicks, is a 81 y.o. male, DOB - 1941/01/04, EXN:[id number]  Outpatient Primary MD for the patient is Celene Squibb, MD    LOS - 4  Admit date - 07/14/2021    Chief Complaint  Patient presents with   Facial Swelling       Brief Narrative (HPI from H&P)   22M with tobacco abuse, hearing loss and testicular cancer s/p orchiectomy who presented to [facility] with a month of left sided jaw pain/swelling with changes to his voice along with dysphagia. CT soft tissue neck showed soft tissue swelling under left mandible and sublingual areas. He has been intubated for airway protection, eventually extubated, he developed some nausea vomiting and required an NG tube placement on 07/17/2021 night.  He was kept on IV antibiotics in ICU for left submandibular infection, he was eventually stabilized and transferred to my service on 07/18/2021.   Subjective:    Nathaniel Hicks today has, No headache, No chest pain, No abdominal pain - No Nausea, No new weakness tingling or numbness, no SOB, throat discomfort because of the NG tube.   Assessment  & Plan :   Left submandibular swelling with infection versus malignant changes.  Underwent preemptive intubation for airway protection, was under ICU care with IV antibiotics, seems to have responded well to antibiotics, swelling improved.  Repeat maxillofacial CT scan done on 07/19/2018 discussed with dental surgeon Dr. Conley Simmonds and ENT surgeon Dr. Redmond Baseman.  Per Dr. Conley Simmonds no dental surgical need, Dr. Redmond Baseman the patient, continue antibiotics and supportive care.  If stable most likely will be transition soon to oral antibiotic with outpatient ENT follow-up.  Sepsis upon  admission has resolved.  2.  Nausea vomiting night of 07/17/2021 with NG tube placement.  KUB on 07/18/2021 stable.  Clamp NG tube challenge with oral diet and monitor .  3.  Weakness and deconditioning.  PT, OT and speech evaluation.  4.  History of testicular cancer s/p orchiectomy.  Outpatient follow-up with PCP and oncology  5.  Dysphagia..  Likely due to #1 above, improving, speech  following.  Clear liquid diet and monitor.  6.  Hypertension.  On combination of hydralazine, ACE inhibitor and Norvasc.  Monitor and adjust.  7.  BPH.  Placed on Flomax.  8.  Dyslipidemia.  Placed on statin.      Condition - Extremely Guarded  Family Communication  :  None present  Code Status :  Full  Consults  :  ENT, PCCM  PUD Prophylaxis : PPI   Procedures  :     CT - Decreased left submandibular inflammatory changes. Increased dilatation of the left submandibular duct [name]) again without a stone. An occult lesion at the orifice is not excluded. Stable indeterminate enlarged right submandibular node. Of potential relevance, there is a markedly decayed presumed right mandibular molar.      Disposition Plan  :    Status is: Inpatient   DVT Prophylaxis  :    enoxaparin (LOVENOX) injection 40 mg Start: 07/14/21 1545    Lab Results  Component Value Date   PLT 279 07/18/2021    Diet :  Diet Order             Diet clear liquid Room service appropriate? Yes; Fluid consistency: Thin  Diet effective now                    Inpatient Medications  Scheduled Meds:  amLODipine  10 mg Per Tube Daily   And   benazepril  40 mg Per Tube Daily   Chlorhexidine Gluconate Cloth  6 each Topical Q0600   enoxaparin (LOVENOX) injection  40 mg Subcutaneous Q24H   feeding supplement (PROSource TF)  45 mL Per Tube TID   gabapentin  300 mg Per Tube BID   hydrALAZINE  25 mg Per Tube BID   insulin aspart  0-9 Units Subcutaneous Q4H   pantoprazole sodium  40 mg Per Tube Daily    senna-docusate  2 tablet Per NG tube BID   Continuous Infusions:  feeding supplement (OSMOLITE 1.5 CAL) Stopped (07/18/21 0230)   lactated ringers     piperacillin-tazobactam (ZOSYN)  IV 3.375 g (07/18/21 0623)   promethazine (PHENERGAN) injection (IM or IVPB)     PRN Meds:.acetaminophen (TYLENOL) oral liquid 160 mg/5 mL, bisacodyl, hydrALAZINE, ondansetron (ZOFRAN) IV, promethazine (PHENERGAN) injection (IM or IVPB)  Time Spent in minutes  30   Lala Lund M.D on 07/18/2021 at 1:07 PM  To page go to [email or web address]   [facility] -  Office  [phone]  See all Orders from today for further details    Objective:   Vitals:   07/18/21 0343 07/18/21 0416 07/18/21 0910 07/18/21 1211  BP: (!) 144/70  (!) 147/75 (!) 151/66  Pulse: 99  87 70  Resp: 19  (!) 23 19  Temp: 97.8 F (36.6 C)  98.7 F (37.1 C) 98.7 F (37.1 C)  TempSrc: Oral  Axillary Oral  SpO2: 96%  90% 94%  Weight:  83.6 kg    Height:        Wt Readings from Last 3 Encounters:  07/18/21 83.6 kg  01/10/21 86.2 kg  11/19/20 90.4 kg     Intake/Output Summary (Last 24 hours) at 07/18/2021 1307 Last data filed at 07/18/2021 0300 Gross per 24 hour  Intake 420 ml  Output 1000 ml  Net -580 ml     Physical Exam  Awake Alert, No new F.N deficits, NG-tube in place, mild left-sided submandibular swelling, minimal to no tenderness on palpation [place].AT,PERRAL Supple Neck, No JVD,  Symmetrical Chest wall movement, Good air movement bilaterally, CTAB RRR,No Gallops,Rubs or new Murmurs,  +ve B.Sounds, Abd Soft, No tenderness,   No Cyanosis, Clubbing or edema        Data Review:    CBC Recent Labs  Lab 07/14/21 0727 07/14/21 1501 07/15/21 0542 07/16/21 0117 07/17/21 0343 07/18/21 0044  WBC 11.6*  --  15.0* 16.6* 15.3* 12.1*  HGB 14.5 12.6* 12.5* 11.8* 13.5 13.8  HCT 44.5 37.0* 38.2* 35.7* 40.7 42.3  PLT 244  --  236 210 272 279  MCV 102.5*  --  100.0 100.3* 100.5* 100.2*  MCH 33.4  --  32.7  33.1 33.3 32.7  MCHC 32.6  --  32.7 33.1 33.2 32.6  RDW 13.0  --  12.9 13.2 13.4 13.5  LYMPHSABS 1.4  --   --   --   --   --   MONOABS 1.4*  --   --   --   --   --   EOSABS 0.0  --   --   --   --   --   BASOSABS 0.1  --   --   --   --   --     Electrolytes Recent Labs  Lab 07/14/21 0727 07/14/21 0846 07/14/21 1121 07/14/21 1501 07/15/21 0542 07/15/21 0929 07/16/21 0117 07/17/21 0343 07/18/21 0044  NA 136  --   --  136 137  --  140 141 144  K 3.9  --   --  4.1 3.8  --  4.3 4.1 3.5  CL 105  --   --   --  108  --  110 114* 110  CO2 25  --   --   --  20*  --  19* 22 25  GLUCOSE 126*  --   --   --  138*  --  131* 153* 168*  BUN 8  --   --   --  13  --  23 25* 15  CREATININE 0.86  --   --   --  0.99  --  0.98 1.05 0.83  CALCIUM 9.8  --   --   --  9.3  --  9.3 9.5 9.6  AST 14*  --   --   --   --   --   --   --   --   ALT 12  --   --   --   --   --   --   --   --   ALKPHOS 62  --   --   --   --   --   --   --   --   BILITOT 0.8  --   --   --   --   --   --   --   --   ALBUMIN 4.3  --   --   --   --   --   --   --   --   MG  --   --   --   --   --  2.0  --   --   --   CRP 5.5*  --   --   --   --   --   --   --   --   LATICACIDVEN  --  2.5* 1.6  --   --   --   --   --   --   INR 1.0  --   --   --   --   --   --   --   --     ------------------------------------------------------------------------------------------------------------------  Recent Labs    07/18/21 0044  TRIG 71    No results found for: "HGBA1C"  No results for input(s): "TSH", "T4TOTAL", "T3FREE", "THYROIDAB" in the last 72 hours.  Invalid input(s): "FREET3" ------------------------------------------------------------------------------------------------------------------ ID Labs Recent Labs  Lab 07/14/21 0727 07/14/21 0846 07/14/21 1121 07/15/21 0542 07/16/21 0117 07/17/21 0343 07/18/21 0044  WBC 11.6*  --   --  15.0* 16.6* 15.3* 12.1*  PLT 244  --   --  236 210 272 279  CRP 5.5*  --   --   --   --    --   --   LATICACIDVEN  --  2.5* 1.6  --   --   --   --   CREATININE 0.86  --   --  0.99 0.98 1.05 0.83   Cardiac Enzymes No results for input(s): "CKMB", "TROPONINI", "MYOGLOBIN" in the last 168 hours.  Invalid input(s): "CK"   Radiology Reports CT MAXILLOFACIAL W CONTRAST  Result Date: 07/18/2021 CLINICAL DATA:  Sublingual/submandibular abscess EXAM: CT MAXILLOFACIAL WITH CONTRAST TECHNIQUE: Multidetector CT imaging of the maxillofacial structures was performed with intravenous contrast. Multiplanar CT image reconstructions were also generated. RADIATION DOSE REDUCTION: This exam was performed according to the departmental dose-optimization program which includes automated exposure control, adjustment of the mA and/or kV according to patient size and/or use of iterative reconstruction technique. CONTRAST:  58m OMNIPAQUE IOHEXOL 300 MG/ML  SOLN COMPARISON:  CT neck 07/14/2021 FINDINGS: Osseous: Temporomandibular joints are unremarkable. Markedly decayed presumed right mandibular molar. Cervical spine degenerative changes. Orbits: Partially included.  Bilateral lens replacements. Sinuses: Minor mucosal thickening. Soft tissues: Increased dilatation of left submandibular duct ([name]. Similar mild dilatation of the left intraglandular ducts. No stone. Decreased inflammatory changes in the left submandibular space. Unchanged size of enlarged right submandibular node with mild heterogeneity measuring 1.7 cm in short axis. Nasogastric tube is present. Limited intracranial: No abnormal enhancement. IMPRESSION: Decreased left submandibular inflammatory changes. Increased dilatation of the left submandibular duct ([name] again without a stone. An occult lesion at the orifice is not excluded. Stable indeterminate enlarged right submandibular node. Of potential relevance, there is a markedly decayed presumed right mandibular molar. Electronically Signed   By: PMacy MisM.D.   On: 07/18/2021 09:15    DG Abd Portable 1V  Result Date: 07/18/2021 CLINICAL DATA:  Abdominal distension EXAM: PORTABLE ABDOMEN - 1 VIEW COMPARISON:  07/15/2021 FINDINGS: Gastric catheter extends into the stomach. Scattered large and small bowel gas is again identified. No free air is seen. No acute bony abnormality is noted. IMPRESSION: Gastric catheter within the stomach. No obstructive changes are seen. Electronically Signed   By: MInez CatalinaM.D.   On: 07/18/2021 03:30   DG Abd 1 View  Result Date: 07/15/2021 CLINICAL DATA:  NG tube placement EXAM: ABDOMEN - 1 VIEW COMPARISON:  None Available. FINDINGS: The enteric catheter tip projects over the distal stomach/first portion of the duodenum. There is a nonobstructive bowel gas pattern in the imaged abdomen. Is no definite free intraperitoneal air, within the confines of supine technique. IMPRESSION: Enteric catheter tip is in the distal stomach/first portion of the duodenum. Electronically Signed   By: PValetta MoleM.D.   On: 07/15/2021 12:39   DG Chest Port 1 View  Result Date: 07/14/2021 CLINICAL DATA:  Respiratory failure. EXAM: PORTABLE CHEST 1 VIEW COMPARISON:  07/14/2021 FINDINGS: 1440 hours. Endotracheal tube tip is approximately 3.3 cm above the base of the carina. Low volume film with left base atelectasis. The cardio  pericardial silhouette is enlarged. No airspace pulmonary edema focal lung consolidation. No substantial pleural effusion. Telemetry leads overlie the chest. IMPRESSION: 1. Endotracheal tube tip is 3.3 cm above the base of the carina. 2. Low volume film with left base atelectasis. Electronically Signed   By: Misty Stanley M.D.   On: 07/14/2021 14:49

## 2021-07-18 NOTE — Progress Notes (Signed)
PCCM Interval Progress Note  Secure chatted by patient's RN, Alferd Apa, earlier in the evening regarding patient's nausea and 2 episodes of small volume yellow emesis. Zofran requested - reviewed patient's last EKG from 7/4 and QTc was noted to be 521. Repeated EKG and QTc 450. Zofran ordered '4mg'$  Q6H PRN.  Contacted by Alferd Apa again after patient unfortunately had another episode of emesis and increased abdominal distention. TF held. KUB ordered and evaluated patient at bedside. Patient did not appear to be in distress. On physical exam, patient's abdomen was moderately distended and tympanic on percussion with hyperactive bowel sounds. No significant pain on palpation of the abdomen. He is not sure when his last BM was; Senokot BID ordered on [facility] for bowel regimen. On chart review, Colace and Miralax discontinued 7/6. Stool occurrences x 2 charted 7/5 and x 4 charted 7/6.  KUB reviewed and gaseous bowel distention noted, grossly similar to prior 7/4 without evidence of obstructive changes. Asked RN to place NGT to continuous suction to decompress, then transition to LIWS.  PCCM will remain available as needed.  Lestine Mount, PA-C [facility] 07/18/21 3:40 AM  Please see [email or web address] for pager details.  From 7A-7P if no response, please call [phone] After hours, please call ELink [phone]

## 2021-07-18 NOTE — Progress Notes (Signed)
Speech Language Pathology Treatment: Dysphagia  Patient Details Name: RODERT HINCH MRN: [id number] DOB: 1940-08-03 Today's Date: 07/18/2021 Time: 2633-3545 SLP Time Calculation (min) (ACUTE ONLY): 25 min  Assessment / Plan / Recommendation Clinical Impression  Pt seen for f/u dysphagia tx with upgraded PO trials of Dysphagia 1/thin liquids.  Pt consumed thin via tsp with min verbal cues provided by SLP for containment of bolus and initiating swallow efficiently.  Pt with grimacing suggesting potential odynophagia, but pt only c/o L thigh pain when SLP asked about pain level.  Pt with trial of puree/thin without overt s/s of aspiration present.  Pt refused soft solid consistency d/t continued L pain/impaired mastication efforts.  Recommend progressing diet to Dysphagia 1/thin liquids with pt preferred foods related to this diet for increased satiety.  Discussed safe swallowing strategies with pt/caregiver and conservative diet choice with both in agreement with initiation of more conservative diet.  ST will continue to f/u for diet tolerance and education re: swallowing while in acute setting.     HPI HPI: Patient is an 81 y.o. male with PMH: HTN, tobacco abuse, HLD, arthritis, BPH, testicular cancer s/p right radial orchiectomy, HOH. He presented to [facility] ER on 07/14/21 with left sided jaw swelling and odontogenic pain for one month with voice changes, dysphagia and poor PO intake for past 24 hours prior. In ER, temp was 99.9, patient was tachycardic, normotensive and oxygen saturations normal. He was noted to have swelling under left mandible and sublingual areas. CXR negative. CT soft tissues noted soft tissue swelling in the left submandibular region, oropharynx including tongue base, supraglottic larynx and with probably extension into the floor of the mouth. Also noted enlarged contralateral right submandibular node and left submandibular gland; no abscess, and neoplastic etiology not excluded but  appearance favors inflammatory etiology.  Concern for airway compromise and patient was electively intubated; transferred to [facility]. He was extubated on 07/16/21 at 1232 and placed on 10L salter. NG tube in place for nutrition; BSE completed on 07/17/21 and placed on thin liquid diet.  ST f/u for diet progression.      SLP Plan  Continue with current plan of care      Recommendations for follow up therapy are one component of a multi-disciplinary discharge planning process, led by the attending physician.  Recommendations may be updated based on patient status, additional functional criteria and insurance authorization.    Recommendations  Diet recommendations: Dysphagia 1 (puree);Thin liquid Liquids provided via: Teaspoon Medication Administration: Crushed with puree Supervision: Full supervision/cueing for compensatory strategies;Staff to assist with self feeding Compensations: Slow rate;Small sips/bites Postural Changes and/or Swallow Maneuvers: Seated upright 90 degrees                Oral Care Recommendations: Oral care BID;Staff/trained caregiver to provide oral care Follow Up Recommendations: No SLP follow up Assistance recommended at discharge: Frequent or constant Supervision/Assistance SLP Visit Diagnosis: Dysphagia, unspecified (R13.10) Plan: Continue with current plan of care           Elvina Sidle, M.S., CCC-SLP  07/18/2021, 7:00 PM

## 2021-07-18 NOTE — Evaluation (Signed)
Physical Therapy Evaluation Patient Details Name: Nathaniel Hicks MRN: [id number] DOB: 07-Dec-1940 Today's Date: 07/18/2021  History of Present Illness  81 yo male who was admitted on 7/3 to [facility] was found to have L side facial swelling and pain from a month long history that stared with odontogenic pain.  Developed swallowing issues with voice changes and was preventatively intubated for airway protection.  Now is thought to have necrotic tissue in L submandibular gland.  On NG tube to decompress, being evaluated to see if abscess is present.  PMHx:  HTN, tobacco use, HLD, OA, testicular CA with R radial orchiectomy, HOH, BPH  Clinical Impression  Pt was seen for mobility on side of bed with NG tube in place, very weak and concerning given his limited help at home.  Pt is in a level entrance apt, able to walk previously with no AD, but has been getting sick over a month's time.  Follow acutely for strength and standing endurance as tolerated, and will recommend him to SNF care for follow up with his weakness from intubation and infection.  Follow acute PT goals as are outlined below.       Recommendations for follow up therapy are one component of a multi-disciplinary discharge planning process, led by the attending physician.  Recommendations may be updated based on patient status, additional functional criteria and insurance authorization.  Follow Up Recommendations Skilled nursing-short term rehab (<3 hours/day) Can patient physically be transported by private vehicle: No    Assistance Recommended at Discharge Frequent or constant Supervision/Assistance  Patient can return home with the following  Two people to help with walking and/or transfers;A lot of help with bathing/dressing/bathroom;Assistance with cooking/housework;Direct supervision/assist for medications management;Direct supervision/assist for financial management;Assist for transportation;Help with stairs or ramp for  entrance    Equipment Recommendations None recommended by PT  Recommendations for Other Services       Functional Status Assessment Patient has had a recent decline in their functional status and demonstrates the ability to make significant improvements in function in a reasonable and predictable amount of time.     Precautions / Restrictions Precautions Precautions: Fall Precaution Comments: NG tube, aspiration risk Restrictions Weight Bearing Restrictions: No      Mobility  Bed Mobility Overal bed mobility: Needs Assistance Bed Mobility: Sit to Supine, Supine to Sit     Supine to sit: Mod assist Sit to supine: Mod assist   General bed mobility comments: mod due to guarding abdomen    Transfers Overall transfer level: Needs assistance Equipment used: 1 person hand held assist Transfers: Sit to/from Stand Sit to Stand: Total assist           General transfer comment: pt struggled to generate force to power up with legs    Ambulation/Gait               General Gait Details: unable  Stairs            Wheelchair Mobility    Modified Rankin (Stroke Patients Only)       Balance Overall balance assessment: Needs assistance Sitting-balance support: Feet supported Sitting balance-Leahy Scale: Fair                                       Pertinent Vitals/Pain Pain Assessment Pain Assessment: 0-10 Pain Score: 8  Facial Expression: Relaxed, neutral Body Movements: Protection Muscle Tension: Relaxed Pain  Location: throat and abdomen Pain Descriptors / Indicators: Guarding, Grimacing, Tender Pain Intervention(s): Monitored during session, Repositioned, Limited activity within patient's tolerance    Home Living Family/patient expects to be discharged to:: Private residence Living Arrangements: Spouse/significant other Available Help at Discharge: Family;Available 24 hours/day Type of Home: Apartment Home Access: Level entry        Home Layout: One level (elevator to his floor) Home Equipment: Rolling Walker (2 wheels);Cane - single point Additional Comments: has been independent with no AD needed, no longer drives    Prior Function Prior Level of Function : Independent/Modified Independent;Other (comment) (no longer drives)             Mobility Comments: no AD needed for any gait distances ADLs Comments: I for all self care using standing shower     Hand Dominance   Dominant Hand: Right    Extremity/Trunk Assessment   Upper Extremity Assessment Upper Extremity Assessment: Defer to OT evaluation    Lower Extremity Assessment Lower Extremity Assessment: Generalized weakness    Cervical / Trunk Assessment Cervical / Trunk Assessment: Normal  Communication   Communication: HOH  Cognition Arousal/Alertness: Awake/alert Behavior During Therapy: WFL for tasks assessed/performed Overall Cognitive Status: Within Functional Limits for tasks assessed                                 General Comments: pt is appropriate to answer questions        General Comments General comments (skin integrity, edema, etc.): Pt is weak, able to maintain O2 sats on cannula and has normal HR but is quite tired from recent medical complications including now on NG tube    Exercises     Assessment/Plan    PT Assessment Patient needs continued PT services  PT Problem List Decreased strength;Decreased activity tolerance;Decreased balance;Cardiopulmonary status limiting activity       PT Treatment Interventions Gait training;DME instruction;Functional mobility training;Therapeutic activities;Therapeutic exercise;Balance training;Neuromuscular re-education;Patient/family education    PT Goals (Current goals can be found in the Care Plan section)  Acute Rehab PT Goals Patient Stated Goal: to feel better and get home PT Goal Formulation: With patient/family Time For Goal Achievement:  08/01/21 Potential to Achieve Goals: Good    Frequency Min 2X/week     Co-evaluation               AM-PAC PT "6 Clicks" Mobility  Outcome Measure Help needed turning from your back to your side while in a flat bed without using bedrails?: A Lot Help needed moving from lying on your back to sitting on the side of a flat bed without using bedrails?: A Lot Help needed moving to and from a bed to a chair (including a wheelchair)?: A Lot Help needed standing up from a chair using your arms (e.g., wheelchair or bedside chair)?: Total Help needed to walk in hospital room?: Total Help needed climbing 3-5 steps with a railing? : Total 6 Click Score: 9    End of Session Equipment Utilized During Treatment: Oxygen Activity Tolerance: Patient limited by fatigue;Treatment limited secondary to medical complications (Comment) Patient left: in bed;with call bell/phone within reach;with bed alarm set;with family/visitor present Nurse Communication: Mobility status PT Visit Diagnosis: Muscle weakness (generalized) (M62.81);Difficulty in walking, not elsewhere classified (R26.2);Other abnormalities of gait and mobility (R26.89);Pain Pain - Right/Left:  (abdomen) Pain - part of body:  (abdomen)    Time: 3875-6433 PT Time Calculation (min) (ACUTE  ONLY): 28 min   Charges:   PT Evaluation $PT Eval Moderate Complexity: 1 Mod PT Treatments $Therapeutic Activity: 8-22 mins       Ramond Dial 07/18/2021, 1:36 PM  Mee Hives, PT PhD Acute Rehab Dept. Number: [place] and [place]

## 2021-07-18 NOTE — Procedures (Signed)
Preop diagnosis: Floor of mouth cancer Postop diagnosis: same Procedure: Biopsy of floor of mouth Surgeon: Redmond Baseman Assist: None Anesth: Local with 1% lidocaine with 1:100,000 epinephrine Compl: None Findings: Ulcerative mass of floor of mouth.  Granular tissue from periphery sent for biopsy.  Incision to remove tissue resulted in opening of the left submandibular duct with copious flow of yellow pus. Description:  After discussing risks, benefits, and alternatives, the patient was addressed in his hospital room.  The floor of mouth was suctioned and then injected with local anesthetic.  A few pieces of tissue were then removed from the periphery of the ulcerative mass using a 15 blade.  At one point, this resulted in copious flow of purulent fluid from the left submandibular gland.  He was then returned to nursing care in stable condition with minor bleeding in the floor of mouth.

## 2021-07-19 ENCOUNTER — Inpatient Hospital Stay (HOSPITAL_COMMUNITY): Payer: Medicare Other

## 2021-07-19 DIAGNOSIS — R220 Localized swelling, mass and lump, head: Secondary | ICD-10-CM | POA: Diagnosis not present

## 2021-07-19 LAB — CBC WITH DIFFERENTIAL/PLATELET
Abs Immature Granulocytes: 0.11 10*3/uL — ABNORMAL HIGH (ref 0.00–0.07)
Basophils Absolute: 0 10*3/uL (ref 0.0–0.1)
Basophils Relative: 0 %
Eosinophils Absolute: 0.1 10*3/uL (ref 0.0–0.5)
Eosinophils Relative: 0 %
HCT: 40.3 % (ref 39.0–52.0)
Hemoglobin: 13.2 g/dL (ref 13.0–17.0)
Immature Granulocytes: 1 %
Lymphocytes Relative: 12 %
Lymphs Abs: 1.7 10*3/uL (ref 0.7–4.0)
MCH: 33.2 pg (ref 26.0–34.0)
MCHC: 32.8 g/dL (ref 30.0–36.0)
MCV: 101.3 fL — ABNORMAL HIGH (ref 80.0–100.0)
Monocytes Absolute: 1.6 10*3/uL — ABNORMAL HIGH (ref 0.1–1.0)
Monocytes Relative: 12 %
Neutro Abs: 10.2 10*3/uL — ABNORMAL HIGH (ref 1.7–7.7)
Neutrophils Relative %: 75 %
Platelets: 257 10*3/uL (ref 150–400)
RBC: 3.98 MIL/uL — ABNORMAL LOW (ref 4.22–5.81)
RDW: 13.4 % (ref 11.5–15.5)
WBC: 13.6 10*3/uL — ABNORMAL HIGH (ref 4.0–10.5)
nRBC: 0 % (ref 0.0–0.2)

## 2021-07-19 LAB — BRAIN NATRIURETIC PEPTIDE: B Natriuretic Peptide: 103.1 pg/mL — ABNORMAL HIGH (ref 0.0–100.0)

## 2021-07-19 LAB — COMPREHENSIVE METABOLIC PANEL
ALT: 40 U/L (ref 0–44)
AST: 38 U/L (ref 15–41)
Albumin: 2.5 g/dL — ABNORMAL LOW (ref 3.5–5.0)
Alkaline Phosphatase: 38 U/L (ref 38–126)
Anion gap: 9 (ref 5–15)
BUN: 13 mg/dL (ref 8–23)
CO2: 27 mmol/L (ref 22–32)
Calcium: 9.5 mg/dL (ref 8.9–10.3)
Chloride: 106 mmol/L (ref 98–111)
Creatinine, Ser: 0.84 mg/dL (ref 0.61–1.24)
GFR, Estimated: >60 mL/min (ref >60)
Glucose, Bld: 105 mg/dL — ABNORMAL HIGH (ref 70–99)
Potassium: 4 mmol/L (ref 3.5–5.1)
Sodium: 142 mmol/L (ref 135–145)
Total Bilirubin: 1.3 mg/dL — ABNORMAL HIGH (ref 0.3–1.2)
Total Protein: 6.1 g/dL — ABNORMAL LOW (ref 6.5–8.1)

## 2021-07-19 LAB — CULTURE, BLOOD (ROUTINE X 2)
Culture: NO GROWTH
Culture: NO GROWTH
Special Requests: ADEQUATE
Special Requests: ADEQUATE

## 2021-07-19 LAB — GLUCOSE, CAPILLARY
Glucose-Capillary: 102 mg/dL — ABNORMAL HIGH (ref 70–99)
Glucose-Capillary: 102 mg/dL — ABNORMAL HIGH (ref 70–99)
Glucose-Capillary: 111 mg/dL — ABNORMAL HIGH (ref 70–99)
Glucose-Capillary: 114 mg/dL — ABNORMAL HIGH (ref 70–99)
Glucose-Capillary: 92 mg/dL (ref 70–99)
Glucose-Capillary: 99 mg/dL (ref 70–99)

## 2021-07-19 LAB — PROCALCITONIN: Procalcitonin: <0.1 ng/mL

## 2021-07-19 LAB — MAGNESIUM: Magnesium: 2.2 mg/dL (ref 1.7–2.4)

## 2021-07-19 MED ORDER — METRONIDAZOLE 500 MG/100ML IV SOLN
500.0000 mg | Freq: Three times a day (TID) | INTRAVENOUS | Status: DC
  Administered 2021-07-19 – 2021-07-21 (×6): 500 mg via INTRAVENOUS
  Filled 2021-07-19 (×6): qty 100

## 2021-07-19 MED ORDER — ORAL CARE MOUTH RINSE: 15.0000 mL | OROMUCOSAL | Status: DC | PRN

## 2021-07-19 MED ORDER — SODIUM CHLORIDE 0.9 % IV SOLN: INTRAVENOUS | Status: DC | PRN

## 2021-07-19 MED ORDER — LOPERAMIDE HCL 2 MG PO CAPS: 2.0000 mg | ORAL_CAPSULE | Freq: Once | ORAL | Status: DC

## 2021-07-19 MED ORDER — SODIUM CHLORIDE 0.9 % IV SOLN
3.0000 g | Freq: Four times a day (QID) | INTRAVENOUS | Status: DC
  Administered 2021-07-19 – 2021-07-20 (×4): 3 g via INTRAVENOUS
  Filled 2021-07-19 (×4): qty 8

## 2021-07-19 NOTE — Progress Notes (Signed)
PROGRESS NOTE                                                                                                                                                                                                             Patient Demographics:    Nathaniel Hicks, is a 81 y.o. male, DOB - [birth year], CVE:[id number]  Outpatient Primary MD for the patient is Celene Squibb, MD    LOS - 5  Admit date - 07/14/2021    Chief Complaint  Patient presents with   Facial Swelling       Brief Narrative (HPI from H&P)   74M with tobacco abuse, hearing loss and testicular cancer s/p orchiectomy who presented to [facility] with a month of left sided jaw pain/swelling with changes to his voice along with dysphagia. CT soft tissue neck showed soft tissue swelling under left mandible and sublingual areas. He has been intubated for airway protection, eventually extubated, he developed some nausea vomiting and required an NG tube placement on 07/17/2021 night.  He was kept on IV antibiotics in ICU for left submandibular infection, he was eventually stabilized and transferred to my service on 07/18/2021.   Subjective:    Nathaniel Hicks today has, No headache, No chest pain, No abdominal pain - No Nausea, No new weakness tingling or numbness, no SOB, throat discomfort because of the NG tube.   Assessment  & Plan :   Left submandibular swelling with infection versus malignant changes.  Underwent preemptive intubation for airway protection, was under ICU care with IV antibiotics, seems to have responded well to antibiotics, swelling improved.  Repeat maxillofacial CT scan done on 07/19/2018 discussed with dental surgeon Dr. Conley Simmonds and ENT surgeon Dr. Redmond Baseman.  Per Dr. Conley Simmonds no dental surgical need.  He was seen by ENT surgeon Dr. Melida Quitter who took him to the OR on 07/19/2026 for biopsy of a mass on the floor of the mouth with drainage of copious amounts of pus from the  left submandibular gland, postprocedure he is doing better.  For now continue antibiotics follow any available intraoperative cultures and monitor.   2.  Nausea vomiting night of 07/17/2021 with NG tube placement.  KUB on 07/18/2021 stable.  This problem has resolved he had NG tube it was clamped and he tolerated it well for 24 hours, discontinue NG tube  and monitor.  3.  Weakness and deconditioning.  PT, OT and speech evaluation.  4.  History of testicular cancer s/p orchiectomy.  Outpatient follow-up with PCP and oncology  5.  Dysphagia..  Likely due to #1 above, improving, speech following.  Clear liquid diet and monitor.  6.  Hypertension.  On combination of hydralazine, ACE inhibitor and Norvasc.  Monitor and adjust.  7.  BPH.  Placed on Flomax.  8.  Dyslipidemia.  Placed on statin.  9.  Diarrhea in the presence of scheduled bowel stimulants.  Also exposure to antibiotics, stable WBC count, no abdominal pain, discontinue bowel stimulants and monitor.      Condition - Extremely Guarded  Family Communication  :  None present  Code Status :  Full  Consults  :  ENT, PCCM  PUD Prophylaxis : PPI   Procedures  :     ENT Dr. Melida Quitter on 07/18/2021 - Ulcerative mass of floor of mouth.  Granular tissue from periphery sent for biopsy.  Incision to remove tissue resulted in opening of the left submandibular duct with copious flow of yellow pus.  CT - Decreased left submandibular inflammatory changes. Increased dilatation of the left submandibular duct [name]) again without a stone. An occult lesion at the orifice is not excluded. Stable indeterminate enlarged right submandibular node. Of potential relevance, there is a markedly decayed presumed right mandibular molar.      Disposition Plan  :    Status is: Inpatient   DVT Prophylaxis  :    Place and maintain sequential compression device Start: 07/18/21 1728 enoxaparin (LOVENOX) injection 40 mg Start: 07/14/21 1545    Lab  Results  Component Value Date   PLT 257 07/19/2021    Diet :  Diet Order             DIET - DYS 1 Room service appropriate? Yes; Fluid consistency: Thin  Diet effective now                    Inpatient Medications  Scheduled Meds:  amLODipine  10 mg Per Tube Daily   And   benazepril  40 mg Per Tube Daily   Chlorhexidine Gluconate Cloth  6 each Topical Q0600   enoxaparin (LOVENOX) injection  40 mg Subcutaneous Q24H   gabapentin  300 mg Per Tube BID   hydrALAZINE  50 mg Oral BID   insulin aspart  0-9 Units Subcutaneous Q4H   pantoprazole sodium  40 mg Per Tube Daily   pravastatin  80 mg Oral Daily   tamsulosin  0.4 mg Oral QPC supper   vitamin B-12  1,000 mcg Oral Daily   Continuous Infusions:  lactated ringers 50 mL/hr at 07/19/21 0131   piperacillin-tazobactam (ZOSYN)  IV 3.375 g (07/19/21 0540)   promethazine (PHENERGAN) injection (IM or IVPB)     PRN Meds:.acetaminophen (TYLENOL) oral liquid 160 mg/5 mL, hydrALAZINE, ondansetron (ZOFRAN) IV, promethazine (PHENERGAN) injection (IM or IVPB)  Time Spent in minutes  30   Lala Lund M.D on 07/19/2021 at 10:42 AM  To page go to [email or web address]   [facility] -  Office  [phone]  See all Orders from today for further details    Objective:   Vitals:   07/19/21 0014 07/19/21 0412 07/19/21 0500 07/19/21 0800  BP: (!) 165/57 (!) 158/66  (!) 171/74  Pulse: 66 71  79  Resp: '15 18  17  '$ Temp: 97.8 F (36.6 C) 97.8 F (36.6 C)  TempSrc: Oral Oral    SpO2: 97% 97%  97%  Weight:   82.6 kg   Height:        Wt Readings from Last 3 Encounters:  07/19/21 82.6 kg  01/10/21 86.2 kg  11/19/20 90.4 kg     Intake/Output Summary (Last 24 hours) at 07/19/2021 1042 Last data filed at 07/19/2021 0435 Gross per 24 hour  Intake 641.92 ml  Output 700 ml  Net -58.08 ml     Physical Exam  Awake Alert, No new F.N deficits, NG-tube in place, minimal tenderness to palpate around the submandibular area on the  left side [place].AT,PERRAL Supple Neck, No JVD,   Symmetrical Chest wall movement, Good air movement bilaterally, CTAB RRR,No Gallops, Rubs or new Murmurs,  +ve B.Sounds, Abd Soft, No tenderness,   No Cyanosis, Clubbing or edema      Data Review:    CBC Recent Labs  Lab 07/14/21 0727 07/14/21 1501 07/15/21 0542 07/16/21 0117 07/17/21 0343 07/18/21 0044 07/19/21 0054  WBC 11.6*  --  15.0* 16.6* 15.3* 12.1* 13.6*  HGB 14.5   < > 12.5* 11.8* 13.5 13.8 13.2  HCT 44.5   < > 38.2* 35.7* 40.7 42.3 40.3  PLT 244  --  236 210 272 279 257  MCV 102.5*  --  100.0 100.3* 100.5* 100.2* 101.3*  MCH 33.4  --  32.7 33.1 33.3 32.7 33.2  MCHC 32.6  --  32.7 33.1 33.2 32.6 32.8  RDW 13.0  --  12.9 13.2 13.4 13.5 13.4  LYMPHSABS 1.4  --   --   --   --   --  1.7  MONOABS 1.4*  --   --   --   --   --  1.6*  EOSABS 0.0  --   --   --   --   --  0.1  BASOSABS 0.1  --   --   --   --   --  0.0   < > = values in this interval not displayed.    Electrolytes Recent Labs  Lab 07/14/21 0727 07/14/21 0846 07/14/21 1121 07/14/21 1501 07/15/21 0542 07/15/21 0929 07/16/21 0117 07/17/21 0343 07/18/21 0044 07/19/21 0054  NA 136  --   --    < > 137  --  140 141 144 142  K 3.9  --   --    < > 3.8  --  4.3 4.1 3.5 4.0  CL 105  --   --   --  108  --  110 114* 110 106  CO2 25  --   --   --  20*  --  19* '22 25 27  '$ GLUCOSE 126*  --   --   --  138*  --  131* 153* 168* 105*  BUN 8  --   --   --  13  --  23 25* 15 13  CREATININE 0.86  --   --   --  0.99  --  0.98 1.05 0.83 0.84  CALCIUM 9.8  --   --   --  9.3  --  9.3 9.5 9.6 9.5  AST 14*  --   --   --   --   --   --   --   --  38  ALT 12  --   --   --   --   --   --   --   --  40  ALKPHOS 62  --   --   --   --   --   --   --   --  38  BILITOT 0.8  --   --   --   --   --   --   --   --  1.3*  ALBUMIN 4.3  --   --   --   --   --   --   --   --  2.5*  MG  --   --   --   --   --  2.0  --   --   --  2.2  CRP 5.5*  --   --   --   --   --   --   --   --   --    PROCALCITON  --   --   --   --   --   --   --   --   --  <0.10  LATICACIDVEN  --  2.5* 1.6  --   --   --   --   --   --   --   INR 1.0  --   --   --   --   --   --   --   --   --   BNP  --   --   --   --   --   --   --   --   --  103.1*   < > = values in this interval not displayed.    ------------------------------------------------------------------------------------------------------------------ Recent Labs    07/18/21 0044  TRIG 71    No results found for: "HGBA1C"  No results for input(s): "TSH", "T4TOTAL", "T3FREE", "THYROIDAB" in the last 72 hours.  Invalid input(s): "FREET3" ------------------------------------------------------------------------------------------------------------------ ID Labs Recent Labs  Lab 07/14/21 0727 07/14/21 0846 07/14/21 1121 07/15/21 0542 07/16/21 0117 07/17/21 0343 07/18/21 0044 07/19/21 0054  WBC 11.6*  --   --  15.0* 16.6* 15.3* 12.1* 13.6*  PLT 244  --   --  236 210 272 279 257  CRP 5.5*  --   --   --   --   --   --   --   PROCALCITON  --   --   --   --   --   --   --  <0.10  LATICACIDVEN  --  2.5* 1.6  --   --   --   --   --   CREATININE 0.86  --   --  0.99 0.98 1.05 0.83 0.84   Cardiac Enzymes No results for input(s): "CKMB", "TROPONINI", "MYOGLOBIN" in the last 168 hours.  Invalid input(s): "CK"   Radiology Reports DG Abd Portable 1V  Result Date: 07/19/2021 CLINICAL DATA:  Abdominal pain. EXAM: PORTABLE ABDOMEN - 1 VIEW COMPARISON:  07/18/2021 FINDINGS: NG tube tip is probably transpyloric in the second portion of the duodenum. Diffuse gaseous distention of small bowel and colon evident without small bowel or colonic dilatation. Degenerative changes noted lumbar spine. IMPRESSION: 1. NG tube tip is probably transpyloric in the second portion of the duodenum. 2. Minimal gaseous distention of small bowel and colon. No radiographic features to suggest obstruction or an overt ileus. Electronically Signed   By: Misty Stanley M.D.    On: 07/19/2021 10:30   DG Chest Port 1 View  Result Date: 07/19/2021 CLINICAL DATA:  Shortness of breath. EXAM: PORTABLE CHEST 1 VIEW COMPARISON:  07/14/2021 FINDINGS: There is a enteric tube with tip and side port in the expected location of the proximal duodenum. Stable cardiomediastinal contours. Asymmetric elevation of right  hemidiaphragm. No pleural effusion or edema. Mild bibasilar atelectasis. IMPRESSION: 1. Bibasilar atelectasis. 2. Enteric tube tip is in the expected location of the proximal duodenum. Electronically Signed   By: Kerby Moors M.D.   On: 07/19/2021 06:40   CT MAXILLOFACIAL W CONTRAST  Result Date: 07/18/2021 CLINICAL DATA:  Sublingual/submandibular abscess EXAM: CT MAXILLOFACIAL WITH CONTRAST TECHNIQUE: Multidetector CT imaging of the maxillofacial structures was performed with intravenous contrast. Multiplanar CT image reconstructions were also generated. RADIATION DOSE REDUCTION: This exam was performed according to the departmental dose-optimization program which includes automated exposure control, adjustment of the mA and/or kV according to patient size and/or use of iterative reconstruction technique. CONTRAST:  32m OMNIPAQUE IOHEXOL 300 MG/ML  SOLN COMPARISON:  CT neck 07/14/2021 FINDINGS: Osseous: Temporomandibular joints are unremarkable. Markedly decayed presumed right mandibular molar. Cervical spine degenerative changes. Orbits: Partially included.  Bilateral lens replacements. Sinuses: Minor mucosal thickening. Soft tissues: Increased dilatation of left submandibular duct ([name]. Similar mild dilatation of the left intraglandular ducts. No stone. Decreased inflammatory changes in the left submandibular space. Unchanged size of enlarged right submandibular node with mild heterogeneity measuring 1.7 cm in short axis. Nasogastric tube is present. Limited intracranial: No abnormal enhancement. IMPRESSION: Decreased left submandibular inflammatory changes. Increased  dilatation of the left submandibular duct ([name] again without a stone. An occult lesion at the orifice is not excluded. Stable indeterminate enlarged right submandibular node. Of potential relevance, there is a markedly decayed presumed right mandibular molar. Electronically Signed   By: PMacy MisM.D.   On: 07/18/2021 09:15   DG Abd Portable 1V  Result Date: 07/18/2021 CLINICAL DATA:  Abdominal distension EXAM: PORTABLE ABDOMEN - 1 VIEW COMPARISON:  07/15/2021 FINDINGS: Gastric catheter extends into the stomach. Scattered large and small bowel gas is again identified. No free air is seen. No acute bony abnormality is noted. IMPRESSION: Gastric catheter within the stomach. No obstructive changes are seen. Electronically Signed   By: MInez CatalinaM.D.   On: 07/18/2021 03:30   DG Abd 1 View  Result Date: 07/15/2021 CLINICAL DATA:  NG tube placement EXAM: ABDOMEN - 1 VIEW COMPARISON:  None Available. FINDINGS: The enteric catheter tip projects over the distal stomach/first portion of the duodenum. There is a nonobstructive bowel gas pattern in the imaged abdomen. Is no definite free intraperitoneal air, within the confines of supine technique. IMPRESSION: Enteric catheter tip is in the distal stomach/first portion of the duodenum. Electronically Signed   By: PValetta MoleM.D.   On: 07/15/2021 12:39

## 2021-07-19 NOTE — Plan of Care (Signed)
  Problem: Clinical Measurements: Goal: Will remain free from infection Outcome: Progressing Goal: Diagnostic test results will improve Outcome: Progressing Goal: Cardiovascular complication will be avoided Outcome: Progressing   Problem: Activity: Goal: Risk for activity intolerance will decrease Outcome: Progressing   Problem: Nutrition: Goal: Adequate nutrition will be maintained Outcome: Progressing   Problem: Coping: Goal: Level of anxiety will decrease Outcome: Progressing

## 2021-07-20 DIAGNOSIS — R220 Localized swelling, mass and lump, head: Secondary | ICD-10-CM | POA: Diagnosis not present

## 2021-07-20 LAB — CBC WITH DIFFERENTIAL/PLATELET
Abs Immature Granulocytes: 0.09 10*3/uL — ABNORMAL HIGH (ref 0.00–0.07)
Basophils Absolute: 0 10*3/uL (ref 0.0–0.1)
Basophils Relative: 0 %
Eosinophils Absolute: 0.2 10*3/uL (ref 0.0–0.5)
Eosinophils Relative: 2 %
HCT: 37.8 % — ABNORMAL LOW (ref 39.0–52.0)
Hemoglobin: 12.3 g/dL — ABNORMAL LOW (ref 13.0–17.0)
Immature Granulocytes: 1 %
Lymphocytes Relative: 14 %
Lymphs Abs: 1.4 10*3/uL (ref 0.7–4.0)
MCH: 33.2 pg (ref 26.0–34.0)
MCHC: 32.5 g/dL (ref 30.0–36.0)
MCV: 101.9 fL — ABNORMAL HIGH (ref 80.0–100.0)
Monocytes Absolute: 1.1 10*3/uL — ABNORMAL HIGH (ref 0.1–1.0)
Monocytes Relative: 11 %
Neutro Abs: 7.4 10*3/uL (ref 1.7–7.7)
Neutrophils Relative %: 72 %
Platelets: 253 10*3/uL (ref 150–400)
RBC: 3.71 MIL/uL — ABNORMAL LOW (ref 4.22–5.81)
RDW: 13.1 % (ref 11.5–15.5)
WBC: 10.2 10*3/uL (ref 4.0–10.5)
nRBC: 0 % (ref 0.0–0.2)

## 2021-07-20 LAB — COMPREHENSIVE METABOLIC PANEL
ALT: 45 U/L — ABNORMAL HIGH (ref 0–44)
AST: 31 U/L (ref 15–41)
Albumin: 2.4 g/dL — ABNORMAL LOW (ref 3.5–5.0)
Alkaline Phosphatase: 41 U/L (ref 38–126)
Anion gap: 8 (ref 5–15)
BUN: 13 mg/dL (ref 8–23)
CO2: 26 mmol/L (ref 22–32)
Calcium: 9.1 mg/dL (ref 8.9–10.3)
Chloride: 106 mmol/L (ref 98–111)
Creatinine, Ser: 0.8 mg/dL (ref 0.61–1.24)
GFR, Estimated: >60 mL/min (ref >60)
Glucose, Bld: 103 mg/dL — ABNORMAL HIGH (ref 70–99)
Potassium: 4.1 mmol/L (ref 3.5–5.1)
Sodium: 140 mmol/L (ref 135–145)
Total Bilirubin: 0.7 mg/dL (ref 0.3–1.2)
Total Protein: 5.9 g/dL — ABNORMAL LOW (ref 6.5–8.1)

## 2021-07-20 LAB — GLUCOSE, CAPILLARY
Glucose-Capillary: 93 mg/dL (ref 70–99)
Glucose-Capillary: 94 mg/dL (ref 70–99)
Glucose-Capillary: 105 mg/dL — ABNORMAL HIGH (ref 70–99)
Glucose-Capillary: 104 mg/dL — ABNORMAL HIGH (ref 70–99)

## 2021-07-20 LAB — TRIGLYCERIDES: Triglycerides: 76 mg/dL (ref <150)

## 2021-07-20 LAB — PROCALCITONIN: Procalcitonin: <0.1 ng/mL

## 2021-07-20 LAB — MAGNESIUM: Magnesium: 2 mg/dL (ref 1.7–2.4)

## 2021-07-20 MED ORDER — AMOXICILLIN-POT CLAVULANATE 875-125 MG PO TABS
1.0000 | ORAL_TABLET | Freq: Two times a day (BID) | ORAL | Status: DC
  Administered 2021-07-20 – 2021-07-23 (×7): 1 via ORAL
  Filled 2021-07-20 (×7): qty 1

## 2021-07-20 NOTE — Progress Notes (Signed)
Subjective: Feeling much better.  Objective: Vital signs in last 24 hours: Temp:  [97.8 F (36.6 C)-98.9 F (37.2 C)] 97.8 F (36.6 C) (07/09 0820) Pulse Rate:  [73-90] 74 (07/09 0820) Resp:  [18-20] 18 (07/09 0820) BP: (130-154)/(56-111) 154/56 (07/09 0820) SpO2:  [93 %-96 %] 94 % (07/09 0820) Weight:  [84.3 kg] 84.3 kg (07/09 0422) Wt Readings from Last 1 Encounters:  07/20/21 84.3 kg    Intake/Output from previous day: 07/08 0701 - 07/09 0700 In: 1128.7 [P.O.:120; I.V.:558.7; NG/GT:250; IV Piggyback:200] Out: 800 [Urine:800] Intake/Output this shift: No intake/output data recorded.  General appearance: alert, cooperative, and no distress Neck: less swelling  Recent Labs    07/19/21 0054 07/20/21 0626  WBC 13.6* 10.2  HGB 13.2 12.3*  HCT 40.3 37.8*  PLT 257 253    Recent Labs    07/19/21 0054 07/20/21 0626  NA 142 140  K 4.0 4.1  CL 106 106  CO2 27 26  GLUCOSE 105* 103*  BUN 13 13  CREATININE 0.84 0.80  CALCIUM 9.5 9.1    Medications: I have reviewed the patient's current medications.  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  WBC normalized.  May be discharged soon.  Pathology pending.  He can follow-up with me as outpatient.   LOS: 6 days   Melida Quitter 07/20/2021, 8:26 AM

## 2021-07-20 NOTE — Progress Notes (Signed)
PROGRESS NOTE                                                                                                                                                                                                             Patient Demographics:    Nathaniel Hicks, is a 81 y.o. male, DOB - [birth year], QIO:[id number]  Outpatient Primary MD for the patient is Celene Squibb, MD    LOS - 6  Admit date - 07/14/2021    Chief Complaint  Patient presents with   Facial Swelling       Brief Narrative (HPI from H&P)   62M with tobacco abuse, hearing loss and testicular cancer s/p orchiectomy who presented to [facility] with a month of left sided jaw pain/swelling with changes to his voice along with dysphagia. CT soft tissue neck showed soft tissue swelling under left mandible and sublingual areas. He has been intubated for airway protection, eventually extubated, he developed some nausea vomiting and required an NG tube placement on 07/17/2021 night.  He was kept on IV antibiotics in ICU for left submandibular infection, he was eventually stabilized and transferred to my service on 07/18/2021.   Subjective:   Patient in bed, appears comfortable, denies any headache, no fever, no chest pain or pressure, no shortness of breath , no abdominal pain. No new focal weakness.   Assessment  & Plan :   Left submandibular swelling with infection versus malignant changes.  Underwent preemptive intubation for airway protection, was under ICU care with IV antibiotics, seems to have responded well to antibiotics, swelling improved.  Repeat maxillofacial CT scan done on 07/19/2018 discussed with dental surgeon Dr. Conley Simmonds and ENT surgeon Dr. Redmond Baseman.  Per Dr. Conley Simmonds no dental surgical need.  He was seen by ENT surgeon Dr. Melida Quitter who took him to the OR on 07/19/2026 for biopsy of a mass on the floor of the mouth with drainage of copious amounts of pus from the left  submandibular gland, postprocedure he is doing better and now almost completely symptom-free.  Will switch Unasyn to Augmentin continue Flagyl, continue to monitor cultures if stable likely discharge in the next 1 to 2 days with outpatient follow-up with Dr. Redmond Baseman.   2.  Nausea vomiting night of 07/17/2021 with NG tube placement.  KUB on 07/18/2021 stable.  This problem has resolved  he had NG tube it was clamped and he tolerated it well for 24 hours, discontinue NG tube and monitor.  3.  Weakness and deconditioning.  PT, OT and speech evaluation.  4.  History of testicular cancer s/p orchiectomy.  Outpatient follow-up with PCP and oncology  5.  Dysphagia..  Likely due to #1 above, improving, speech following.  Clear liquid diet and monitor.  6.  Hypertension.  On combination of hydralazine, ACE inhibitor and Norvasc.  Monitor and adjust.  7.  BPH.  Placed on Flomax.  8.  Dyslipidemia.  Placed on statin.  9.  Diarrhea in the presence of scheduled bowel stimulants.  Also exposure to antibiotics, stable WBC count, no abdominal pain, discontinue bowel stimulants and monitor.      Condition - Extremely Guarded  Family Communication  :  None present  Code Status :  Full  Consults  :  ENT, PCCM  PUD Prophylaxis : PPI   Procedures  :     ENT Dr. Melida Quitter on 07/18/2021 - Ulcerative mass of floor of mouth.  Granular tissue from periphery sent for biopsy.  Incision to remove tissue resulted in opening of the left submandibular duct with copious flow of yellow pus.  CT - Decreased left submandibular inflammatory changes. Increased dilatation of the left submandibular duct [name]) again without a stone. An occult lesion at the orifice is not excluded. Stable indeterminate enlarged right submandibular node. Of potential relevance, there is a markedly decayed presumed right mandibular molar.      Disposition Plan  :    Status is: Inpatient  DVT Prophylaxis  :    Place and maintain  sequential compression device Start: 07/18/21 1728 enoxaparin (LOVENOX) injection 40 mg Start: 07/14/21 1545    Lab Results  Component Value Date   PLT 253 07/20/2021    Diet :  Diet Order             DIET - DYS 1 Room service appropriate? Yes; Fluid consistency: Thin  Diet effective now                    Inpatient Medications  Scheduled Meds:  amLODipine  10 mg Per Tube Daily   And   benazepril  40 mg Per Tube Daily   enoxaparin (LOVENOX) injection  40 mg Subcutaneous Q24H   gabapentin  300 mg Per Tube BID   hydrALAZINE  50 mg Oral BID   pantoprazole sodium  40 mg Per Tube Daily   pravastatin  80 mg Oral Daily   tamsulosin  0.4 mg Oral QPC supper   vitamin B-12  1,000 mcg Oral Daily   Continuous Infusions:  sodium chloride 10 mL/hr at 07/19/21 1735   ampicillin-sulbactam (UNASYN) IV 3 g (07/20/21 0912)   metronidazole 500 mg (07/20/21 0810)   promethazine (PHENERGAN) injection (IM or IVPB)     PRN Meds:.sodium chloride, acetaminophen (TYLENOL) oral liquid 160 mg/5 mL, hydrALAZINE, ondansetron (ZOFRAN) IV, mouth rinse, promethazine (PHENERGAN) injection (IM or IVPB)  Time Spent in minutes  30   Lala Lund M.D on 07/20/2021 at 10:25 AM  To page go to [email or web address]   [facility] -  Office  [phone]  See all Orders from today for further details    Objective:   Vitals:   07/20/21 0314 07/20/21 0400 07/20/21 0422 07/20/21 0820  BP: 130/60 (!) 147/57  (!) 154/56  Pulse: 79 76  74  Resp: '20 18  18  '$ Temp: 98.2 F (36.8  C)   97.8 F (36.6 C)  TempSrc: Oral   Oral  SpO2: 93% 93%  94%  Weight:   84.3 kg   Height:        Wt Readings from Last 3 Encounters:  07/20/21 84.3 kg  01/10/21 86.2 kg  11/19/20 90.4 kg     Intake/Output Summary (Last 24 hours) at 07/20/2021 1025 Last data filed at 07/20/2021 0417 Gross per 24 hour  Intake 1128.66 ml  Output 800 ml  Net 328.66 ml     Physical Exam  Awake Alert, No new F.N deficits, NG-tube  in place, minimal tenderness to palpate around the submandibular area on the left side [place].AT,PERRAL Supple Neck, No JVD,   Symmetrical Chest wall movement, Good air movement bilaterally, CTAB RRR,No Gallops, Rubs or new Murmurs,  +ve B.Sounds, Abd Soft, No tenderness,   No Cyanosis, Clubbing or edema       Data Review:    CBC Recent Labs  Lab 07/14/21 0727 07/14/21 1501 07/16/21 0117 07/17/21 0343 07/18/21 0044 07/19/21 0054 07/20/21 0626  WBC 11.6*   < > 16.6* 15.3* 12.1* 13.6* 10.2  HGB 14.5   < > 11.8* 13.5 13.8 13.2 12.3*  HCT 44.5   < > 35.7* 40.7 42.3 40.3 37.8*  PLT 244   < > 210 272 279 257 253  MCV 102.5*   < > 100.3* 100.5* 100.2* 101.3* 101.9*  MCH 33.4   < > 33.1 33.3 32.7 33.2 33.2  MCHC 32.6   < > 33.1 33.2 32.6 32.8 32.5  RDW 13.0   < > 13.2 13.4 13.5 13.4 13.1  LYMPHSABS 1.4  --   --   --   --  1.7 1.4  MONOABS 1.4*  --   --   --   --  1.6* 1.1*  EOSABS 0.0  --   --   --   --  0.1 0.2  BASOSABS 0.1  --   --   --   --  0.0 0.0   < > = values in this interval not displayed.    Electrolytes Recent Labs  Lab 07/14/21 0727 07/14/21 0846 07/14/21 1121 07/14/21 1501 07/15/21 0929 07/16/21 0117 07/17/21 0343 07/18/21 0044 07/19/21 0054 07/20/21 0626 07/20/21 0636  NA 136  --   --    < >  --  140 141 144 142 140  --   K 3.9  --   --    < >  --  4.3 4.1 3.5 4.0 4.1  --   CL 105  --   --    < >  --  110 114* 110 106 106  --   CO2 25  --   --    < >  --  19* '22 25 27 26  '$ --   GLUCOSE 126*  --   --    < >  --  131* 153* 168* 105* 103*  --   BUN 8  --   --    < >  --  23 25* '15 13 13  '$ --   CREATININE 0.86  --   --    < >  --  0.98 1.05 0.83 0.84 0.80  --   CALCIUM 9.8  --   --    < >  --  9.3 9.5 9.6 9.5 9.1  --   AST 14*  --   --   --   --   --   --   --  38 31  --   ALT 12  --   --   --   --   --   --   --  40 45*  --   ALKPHOS 62  --   --   --   --   --   --   --  38 41  --   BILITOT 0.8  --   --   --   --   --   --   --  1.3* 0.7  --   ALBUMIN 4.3   --   --   --   --   --   --   --  2.5* 2.4*  --   MG  --   --   --   --  2.0  --   --   --  2.2 2.0  --   CRP 5.5*  --   --   --   --   --   --   --   --   --   --   PROCALCITON  --   --   --   --   --   --   --   --  <0.10  --  <0.10  LATICACIDVEN  --  2.5* 1.6  --   --   --   --   --   --   --   --   INR 1.0  --   --   --   --   --   --   --   --   --   --   BNP  --   --   --   --   --   --   --   --  103.1*  --   --    < > = values in this interval not displayed.    ------------------------------------------------------------------------------------------------------------------ Recent Labs    07/18/21 0044 07/20/21 0626  TRIG 71 76    No results found for: "HGBA1C"  No results for input(s): "TSH", "T4TOTAL", "T3FREE", "THYROIDAB" in the last 72 hours.  Invalid input(s): "FREET3" ------------------------------------------------------------------------------------------------------------------ ID Labs Recent Labs  Lab 07/14/21 0727 07/14/21 0846 07/14/21 1121 07/15/21 0542 07/16/21 0117 07/17/21 0343 07/18/21 0044 07/19/21 0054 07/20/21 0626 07/20/21 0636  WBC 11.6*  --   --    < > 16.6* 15.3* 12.1* 13.6* 10.2  --   PLT 244  --   --    < > 210 272 279 257 253  --   CRP 5.5*  --   --   --   --   --   --   --   --   --   PROCALCITON  --   --   --   --   --   --   --  <0.10  --  <0.10  LATICACIDVEN  --  2.5* 1.6  --   --   --   --   --   --   --   CREATININE 0.86  --   --    < > 0.98 1.05 0.83 0.84 0.80  --    < > = values in this interval not displayed.   Cardiac Enzymes No results for input(s): "CKMB", "TROPONINI", "MYOGLOBIN" in the last 168 hours.  Invalid input(s): "CK"   Radiology Reports DG Abd Portable 1V  Result Date: 07/19/2021 CLINICAL DATA:  Abdominal pain. EXAM: PORTABLE ABDOMEN - 1 VIEW COMPARISON:  07/18/2021 FINDINGS:  NG tube tip is probably transpyloric in the second portion of the duodenum. Diffuse gaseous distention of small bowel and colon  evident without small bowel or colonic dilatation. Degenerative changes noted lumbar spine. IMPRESSION: 1. NG tube tip is probably transpyloric in the second portion of the duodenum. 2. Minimal gaseous distention of small bowel and colon. No radiographic features to suggest obstruction or an overt ileus. Electronically Signed   By: Misty Stanley M.D.   On: 07/19/2021 10:30   DG Chest Port 1 View  Result Date: 07/19/2021 CLINICAL DATA:  Shortness of breath. EXAM: PORTABLE CHEST 1 VIEW COMPARISON:  07/14/2021 FINDINGS: There is a enteric tube with tip and side port in the expected location of the proximal duodenum. Stable cardiomediastinal contours. Asymmetric elevation of right hemidiaphragm. No pleural effusion or edema. Mild bibasilar atelectasis. IMPRESSION: 1. Bibasilar atelectasis. 2. Enteric tube tip is in the expected location of the proximal duodenum. Electronically Signed   By: Kerby Moors M.D.   On: 07/19/2021 06:40   CT MAXILLOFACIAL W CONTRAST  Result Date: 07/18/2021 CLINICAL DATA:  Sublingual/submandibular abscess EXAM: CT MAXILLOFACIAL WITH CONTRAST TECHNIQUE: Multidetector CT imaging of the maxillofacial structures was performed with intravenous contrast. Multiplanar CT image reconstructions were also generated. RADIATION DOSE REDUCTION: This exam was performed according to the departmental dose-optimization program which includes automated exposure control, adjustment of the mA and/or kV according to patient size and/or use of iterative reconstruction technique. CONTRAST:  61m OMNIPAQUE IOHEXOL 300 MG/ML  SOLN COMPARISON:  CT neck 07/14/2021 FINDINGS: Osseous: Temporomandibular joints are unremarkable. Markedly decayed presumed right mandibular molar. Cervical spine degenerative changes. Orbits: Partially included.  Bilateral lens replacements. Sinuses: Minor mucosal thickening. Soft tissues: Increased dilatation of left submandibular duct ([name]. Similar mild dilatation of the left  intraglandular ducts. No stone. Decreased inflammatory changes in the left submandibular space. Unchanged size of enlarged right submandibular node with mild heterogeneity measuring 1.7 cm in short axis. Nasogastric tube is present. Limited intracranial: No abnormal enhancement. IMPRESSION: Decreased left submandibular inflammatory changes. Increased dilatation of the left submandibular duct ([name] again without a stone. An occult lesion at the orifice is not excluded. Stable indeterminate enlarged right submandibular node. Of potential relevance, there is a markedly decayed presumed right mandibular molar. Electronically Signed   By: PMacy MisM.D.   On: 07/18/2021 09:15   DG Abd Portable 1V  Result Date: 07/18/2021 CLINICAL DATA:  Abdominal distension EXAM: PORTABLE ABDOMEN - 1 VIEW COMPARISON:  07/15/2021 FINDINGS: Gastric catheter extends into the stomach. Scattered large and small bowel gas is again identified. No free air is seen. No acute bony abnormality is noted. IMPRESSION: Gastric catheter within the stomach. No obstructive changes are seen. Electronically Signed   By: MInez CatalinaM.D.   On: 07/18/2021 03:30

## 2021-07-21 DIAGNOSIS — R220 Localized swelling, mass and lump, head: Secondary | ICD-10-CM | POA: Diagnosis not present

## 2021-07-21 LAB — GLUCOSE, CAPILLARY
Glucose-Capillary: 102 mg/dL — ABNORMAL HIGH (ref 70–99)
Glucose-Capillary: 96 mg/dL (ref 70–99)
Glucose-Capillary: 123 mg/dL — ABNORMAL HIGH (ref 70–99)
Glucose-Capillary: 102 mg/dL — ABNORMAL HIGH (ref 70–99)

## 2021-07-21 MED ORDER — ADULT MULTIVITAMIN W/MINERALS CH
1.0000 | ORAL_TABLET | Freq: Every day | ORAL | Status: DC
  Administered 2021-07-21 – 2021-07-23 (×3): 1 via ORAL
  Filled 2021-07-21 (×3): qty 1

## 2021-07-21 NOTE — Progress Notes (Signed)
PROGRESS NOTE                                                                                                                                                                                                             Patient Demographics:    Nathaniel Hicks, is a 81 y.o. male, DOB - [birth year], MVH:[id number]  Outpatient Primary MD for the patient is Celene Squibb, MD    LOS - 7  Admit date - 07/14/2021    Chief Complaint  Patient presents with   Facial Swelling       Brief Narrative (HPI from H&P)   1M with tobacco abuse, hearing loss and testicular cancer s/p orchiectomy who presented to [facility] with a month of left sided jaw pain/swelling with changes to his voice along with dysphagia. CT soft tissue neck showed soft tissue swelling under left mandible and sublingual areas. He has been intubated for airway protection, eventually extubated, he developed some nausea vomiting and required an NG tube placement on 07/17/2021 night.  He was kept on IV antibiotics in ICU for left submandibular infection, he was eventually stabilized and transferred to my service on 07/18/2021.   Subjective:   Patient in bed, appears comfortable, denies any headache, no fever, no chest pain or pressure, no shortness of breath , no abdominal pain. No new focal weakness.    Assessment  & Plan :   Left submandibular swelling with infection versus malignant changes.  Underwent preemptive intubation for airway protection, was under ICU care with IV antibiotics, seems to have responded well to antibiotics, swelling improved.  Repeat maxillofacial CT scan done on 07/19/2018 discussed with dental surgeon Dr. Conley Simmonds and ENT surgeon Dr. Redmond Baseman.  Per Dr. Conley Simmonds no dental surgical need.  He was seen by ENT surgeon Dr. Melida Quitter who took him to the OR on 07/19/2026 for biopsy of a mass on the floor of the mouth with drainage of copious amounts of pus from the left  submandibular gland, postprocedure he is doing better and now almost completely symptom-free.  Will switch Unasyn to Augmentin continue Flagyl, continue to monitor cultures if stable likely discharge in the next 1 to 2 days with outpatient follow-up with Dr. Redmond Baseman.  If remains stable likely discharge home with home health PT on 07/22/2021.   2.  Nausea vomiting night of 07/17/2021  with NG tube placement.  KUB on 07/18/2021 stable.  Resolved after supportive care.  3.  Weakness and deconditioning.  PT, OT and speech evaluation.  4.  History of testicular cancer s/p orchiectomy.  Outpatient follow-up with PCP and oncology  5.  Dysphagia..  Likely due to #1 above, improving, speech following.  Clear liquid diet and monitor.  6.  Hypertension.  On combination of hydralazine, ACE inhibitor and Norvasc.  Monitor and adjust.  7.  BPH.  Placed on Flomax.  8.  Dyslipidemia.  Placed on statin.  9.  Diarrhea in the presence of scheduled bowel stimulants.  Resolved after supportive care and discontinuation of bowel stimulants.      Condition - Extremely Guarded  Family Communication  :  None present  Code Status :  Full  Consults  :  ENT, PCCM  PUD Prophylaxis : PPI   Procedures  :     ENT Dr. Melida Quitter on 07/18/2021 - Ulcerative mass of floor of mouth.  Granular tissue from periphery sent for biopsy.  Incision to remove tissue resulted in opening of the left submandibular duct with copious flow of yellow pus.  CT - Decreased left submandibular inflammatory changes. Increased dilatation of the left submandibular duct [name]) again without a stone. An occult lesion at the orifice is not excluded. Stable indeterminate enlarged right submandibular node. Of potential relevance, there is a markedly decayed presumed right mandibular molar.      Disposition Plan  :    Status is: Inpatient  DVT Prophylaxis  :    Place and maintain sequential compression device Start: 07/18/21 1728 enoxaparin  (LOVENOX) injection 40 mg Start: 07/14/21 1545    Lab Results  Component Value Date   PLT 253 07/20/2021    Diet :  Diet Order             DIET - DYS 1 Room service appropriate? Yes; Fluid consistency: Thin  Diet effective now                    Inpatient Medications  Scheduled Meds:  amLODipine  10 mg Per Tube Daily   And   benazepril  40 mg Per Tube Daily   amoxicillin-clavulanate  1 tablet Oral Q12H   enoxaparin (LOVENOX) injection  40 mg Subcutaneous Q24H   gabapentin  300 mg Per Tube BID   hydrALAZINE  50 mg Oral BID   pantoprazole sodium  40 mg Per Tube Daily   pravastatin  80 mg Oral Daily   tamsulosin  0.4 mg Oral QPC supper   vitamin B-12  1,000 mcg Oral Daily   Continuous Infusions:  sodium chloride 10 mL/hr at 07/19/21 1735   promethazine (PHENERGAN) injection (IM or IVPB)     PRN Meds:.sodium chloride, acetaminophen (TYLENOL) oral liquid 160 mg/5 mL, hydrALAZINE, ondansetron (ZOFRAN) IV, mouth rinse, promethazine (PHENERGAN) injection (IM or IVPB)  Time Spent in minutes  30   Lala Lund M.D on 07/21/2021 at 10:30 AM  To page go to [email or web address]   [facility] -  Office  [phone]  See all Orders from today for further details    Objective:   Vitals:   07/20/21 2358 07/21/21 0024 07/21/21 0321 07/21/21 0737  BP: (!) 152/68  137/65 (!) 165/73  Pulse:  62 64 80  Resp: 18  18   Temp: 98.3 F (36.8 C)  97.8 F (36.6 C) 97.6 F (36.4 C)  TempSrc: Oral  Oral Oral  SpO2:  94%  92% 91%  Weight:  85.1 kg    Height:        Wt Readings from Last 3 Encounters:  07/21/21 85.1 kg  01/10/21 86.2 kg  11/19/20 90.4 kg     Intake/Output Summary (Last 24 hours) at 07/21/2021 1030 Last data filed at 07/21/2021 0441 Gross per 24 hour  Intake 596.21 ml  Output 1051 ml  Net -454.79 ml     Physical Exam  Awake Alert, No new F.N deficits, Normal affect [place].AT,PERRAL Supple Neck, No JVD,   Symmetrical Chest wall movement, Good air  movement bilaterally, CTAB RRR,No Gallops, Rubs or new Murmurs,  +ve B.Sounds, Abd Soft, No tenderness,   No Cyanosis, Clubbing or edema       Data Review:    CBC Recent Labs  Lab 07/16/21 0117 07/17/21 0343 07/18/21 0044 07/19/21 0054 07/20/21 0626  WBC 16.6* 15.3* 12.1* 13.6* 10.2  HGB 11.8* 13.5 13.8 13.2 12.3*  HCT 35.7* 40.7 42.3 40.3 37.8*  PLT 210 272 279 257 253  MCV 100.3* 100.5* 100.2* 101.3* 101.9*  MCH 33.1 33.3 32.7 33.2 33.2  MCHC 33.1 33.2 32.6 32.8 32.5  RDW 13.2 13.4 13.5 13.4 13.1  LYMPHSABS  --   --   --  1.7 1.4  MONOABS  --   --   --  1.6* 1.1*  EOSABS  --   --   --  0.1 0.2  BASOSABS  --   --   --  0.0 0.0    Electrolytes Recent Labs  Lab 07/14/21 1121 07/14/21 1501 07/15/21 0929 07/16/21 0117 07/17/21 0343 07/18/21 0044 07/19/21 0054 07/20/21 0626 07/20/21 0636  NA  --    < >  --  140 141 144 142 140  --   K  --    < >  --  4.3 4.1 3.5 4.0 4.1  --   CL  --    < >  --  110 114* 110 106 106  --   CO2  --    < >  --  19* '22 25 27 26  '$ --   GLUCOSE  --    < >  --  131* 153* 168* 105* 103*  --   BUN  --    < >  --  23 25* '15 13 13  '$ --   CREATININE  --    < >  --  0.98 1.05 0.83 0.84 0.80  --   CALCIUM  --    < >  --  9.3 9.5 9.6 9.5 9.1  --   AST  --   --   --   --   --   --  38 31  --   ALT  --   --   --   --   --   --  40 45*  --   ALKPHOS  --   --   --   --   --   --  38 41  --   BILITOT  --   --   --   --   --   --  1.3* 0.7  --   ALBUMIN  --   --   --   --   --   --  2.5* 2.4*  --   MG  --   --  2.0  --   --   --  2.2 2.0  --   PROCALCITON  --   --   --   --   --   --  <  0.10  --  <0.10  LATICACIDVEN 1.6  --   --   --   --   --   --   --   --   BNP  --   --   --   --   --   --  103.1*  --   --    < > = values in this interval not displayed.    ------------------------------------------------------------------------------------------------------------------ Recent Labs    07/20/21 0626  TRIG 76    No results found for:  "HGBA1C"  No results for input(s): "TSH", "T4TOTAL", "T3FREE", "THYROIDAB" in the last 72 hours.  Invalid input(s): "FREET3" ------------------------------------------------------------------------------------------------------------------ ID Labs Recent Labs  Lab 07/14/21 1121 07/15/21 0542 07/16/21 0117 07/17/21 0343 07/18/21 0044 07/19/21 0054 07/20/21 0626 07/20/21 0636  WBC  --    < > 16.6* 15.3* 12.1* 13.6* 10.2  --   PLT  --    < > 210 272 279 257 253  --   PROCALCITON  --   --   --   --   --  <0.10  --  <0.10  LATICACIDVEN 1.6  --   --   --   --   --   --   --   CREATININE  --    < > 0.98 1.05 0.83 0.84 0.80  --    < > = values in this interval not displayed.   Cardiac Enzymes No results for input(s): "CKMB", "TROPONINI", "MYOGLOBIN" in the last 168 hours.  Invalid input(s): "CK"   Radiology Reports DG Abd Portable 1V  Result Date: 07/19/2021 CLINICAL DATA:  Abdominal pain. EXAM: PORTABLE ABDOMEN - 1 VIEW COMPARISON:  07/18/2021 FINDINGS: NG tube tip is probably transpyloric in the second portion of the duodenum. Diffuse gaseous distention of small bowel and colon evident without small bowel or colonic dilatation. Degenerative changes noted lumbar spine. IMPRESSION: 1. NG tube tip is probably transpyloric in the second portion of the duodenum. 2. Minimal gaseous distention of small bowel and colon. No radiographic features to suggest obstruction or an overt ileus. Electronically Signed   By: Misty Stanley M.D.   On: 07/19/2021 10:30   DG Chest Port 1 View  Result Date: 07/19/2021 CLINICAL DATA:  Shortness of breath. EXAM: PORTABLE CHEST 1 VIEW COMPARISON:  07/14/2021 FINDINGS: There is a enteric tube with tip and side port in the expected location of the proximal duodenum. Stable cardiomediastinal contours. Asymmetric elevation of right hemidiaphragm. No pleural effusion or edema. Mild bibasilar atelectasis. IMPRESSION: 1. Bibasilar atelectasis. 2. Enteric tube tip is in  the expected location of the proximal duodenum. Electronically Signed   By: Kerby Moors M.D.   On: 07/19/2021 06:40   CT MAXILLOFACIAL W CONTRAST  Result Date: 07/18/2021 CLINICAL DATA:  Sublingual/submandibular abscess EXAM: CT MAXILLOFACIAL WITH CONTRAST TECHNIQUE: Multidetector CT imaging of the maxillofacial structures was performed with intravenous contrast. Multiplanar CT image reconstructions were also generated. RADIATION DOSE REDUCTION: This exam was performed according to the departmental dose-optimization program which includes automated exposure control, adjustment of the mA and/or kV according to patient size and/or use of iterative reconstruction technique. CONTRAST:  44m OMNIPAQUE IOHEXOL 300 MG/ML  SOLN COMPARISON:  CT neck 07/14/2021 FINDINGS: Osseous: Temporomandibular joints are unremarkable. Markedly decayed presumed right mandibular molar. Cervical spine degenerative changes. Orbits: Partially included.  Bilateral lens replacements. Sinuses: Minor mucosal thickening. Soft tissues: Increased dilatation of left submandibular duct ([name]. Similar mild dilatation of the left intraglandular ducts. No stone. Decreased inflammatory changes in  the left submandibular space. Unchanged size of enlarged right submandibular node with mild heterogeneity measuring 1.7 cm in short axis. Nasogastric tube is present. Limited intracranial: No abnormal enhancement. IMPRESSION: Decreased left submandibular inflammatory changes. Increased dilatation of the left submandibular duct [name]) again without a stone. An occult lesion at the orifice is not excluded. Stable indeterminate enlarged right submandibular node. Of potential relevance, there is a markedly decayed presumed right mandibular molar. Electronically Signed   By: Macy Mis M.D.   On: 07/18/2021 09:15   DG Abd Portable 1V  Result Date: 07/18/2021 CLINICAL DATA:  Abdominal distension EXAM: PORTABLE ABDOMEN - 1 VIEW COMPARISON:  07/15/2021  FINDINGS: Gastric catheter extends into the stomach. Scattered large and small bowel gas is again identified. No free air is seen. No acute bony abnormality is noted. IMPRESSION: Gastric catheter within the stomach. No obstructive changes are seen. Electronically Signed   By: Inez Catalina M.D.   On: 07/18/2021 03:30

## 2021-07-21 NOTE — Progress Notes (Signed)
Nutrition Follow-up  DOCUMENTATION CODES:   Not applicable  INTERVENTION:   Multivitamin w/ minerals daily Meal ordering with assist Encourage good PO intake   NUTRITION DIAGNOSIS:   Inadequate oral intake related to inability to eat as evidenced by NPO status. - Progressing, pt now on diet  GOAL:   Patient will meet greater than or equal to 90% of their needs - Progressing  MONITOR:   PO intake, Labs, Weight trends, I & O's, Diet advancement  REASON FOR ASSESSMENT:   Ventilator, Consult Enteral/tube feeding initiation and management  ASSESSMENT:   81 yo male admitted with facial swelling. PMH includes tobacco abuse, HTN, HLD, arthritis, BPH, testicular cancer, hearing loss.  07/03 - intubated 07/04 - TF started 07/05 - extubated, continued TF 07/07 - biopsy of floor mass; diet advanced to Dysphagia 1, thin liquids 07/08 - NG tube removed  Pt reports that he does not like the food and it being pureed. States that he is eating >50% of what they send him though. Denies any difficulty swallowing, states that it is better now and not sore. Discussed having someone come in and take his order to let him pick out what he would like to eat, explained that the foods would still come pureed; pt expressed understanding.  Pt PO intake includes 25% of dinner on 7/8 and 75% of dinner on 7/9.    Pt with no other questions or concerns at this time.  Medications reviewed and include: Augmentin, Protonix, Vitamin B12 Labs reviewed.  Diet Order:   Diet Order             DIET - DYS 1 Room service appropriate? Yes; Fluid consistency: Thin  Diet effective now                   EDUCATION NEEDS:   No education needs have been identified at this time  Skin:  Skin Assessment: Reviewed RN Assessment  Last BM:  7/10  Height:   Ht Readings from Last 1 Encounters:  07/14/21 '5\' 11"'$  (1.803 m)    Weight:   Wt Readings from Last 1 Encounters:  07/21/21 85.1 kg    BMI:   Body mass index is 26.17 kg/m.  Estimated Nutritional Needs:   Kcal:  2100-2300  Protein:  115-130 gm  Fluid:  2.1-2.3 L    Hermina Barters RD, LDN Clinical Dietitian See [facility] for contact information.

## 2021-07-22 DIAGNOSIS — R220 Localized swelling, mass and lump, head: Secondary | ICD-10-CM | POA: Diagnosis not present

## 2021-07-22 LAB — GLUCOSE, CAPILLARY
Glucose-Capillary: 109 mg/dL — ABNORMAL HIGH (ref 70–99)
Glucose-Capillary: 148 mg/dL — ABNORMAL HIGH (ref 70–99)

## 2021-07-22 MED ORDER — HYDRALAZINE HCL 50 MG PO TABS
100.0000 mg | ORAL_TABLET | Freq: Three times a day (TID) | ORAL | Status: DC
  Administered 2021-07-22 – 2021-07-23 (×4): 100 mg via ORAL
  Filled 2021-07-22 (×4): qty 2

## 2021-07-22 NOTE — TOC Progression Note (Addendum)
Transition of Care [facility]) - Progression Note    Patient Details  Name: Nathaniel Hicks MRN: [id number] Date of Birth: 24-Aug-1940  Transition of Care [facility]) CM/SW N[place], RN Phone Number: 07/22/2021, 3:43 PM  Clinical Narrative:     Spoke to patient regarding transition needs. Patient lives by himself but has sisters and a brother that help him. Patient is agreeable to go to a SNF for short tern rehab. Percell Locus, [place] notified of the above.Patient states he has a walker.   Expected Discharge Plan: [place] Barriers to Discharge: Continued Medical Work up  Expected Discharge Plan and Services Expected Discharge Plan: [place] In-house Referral: Clinical Social Work Discharge Planning Services: CM Consult Post Acute Care Choice: [place] arrangements for the past 2 months: Apartment                                       Social Determinants of Health (SDOH) Interventions    Readmission Risk Interventions     No data to display

## 2021-07-22 NOTE — Progress Notes (Signed)
Physical Therapy Treatment Patient Details Name: Nathaniel Hicks MRN: [id number] DOB: 1940-10-21 Today's Date: 07/22/2021   History of Present Illness 81 yo male who was admitted on 7/3 to [facility] was found to have L side facial swelling and pain from a month long history that stared with odontogenic pain.  Developed swallowing issues with voice changes and was preventatively intubated for airway protection.  Now is thought to have necrotic tissue in L submandibular gland.  On NG tube to decompress, being evaluated to see if abscess is present.  PMHx:  HTN, tobacco use, HLD, OA, testicular CA with R radial orchiectomy, HOH, BPH    PT Comments    Pt making fair progress with functional mobility and required less physical assistance with transfers as compared to previous session. Continue to recommend d/c to SNF for further intensive therapy services to maximize his safety and independence with functional mobility prior to returning home. Pt would continue to benefit from skilled physical therapy services at this time while admitted and after d/c to address the below listed limitations in order to improve overall safety and independence with functional mobility.    Recommendations for follow up therapy are one component of a multi-disciplinary discharge planning process, led by the attending physician.  Recommendations may be updated based on patient status, additional functional criteria and insurance authorization.  Follow Up Recommendations  Skilled nursing-short term rehab (<3 hours/day) Can patient physically be transported by private vehicle: No   Assistance Recommended at Discharge Frequent or constant Supervision/Assistance  Patient can return home with the following Two people to help with walking and/or transfers;A lot of help with bathing/dressing/bathroom;Assistance with cooking/housework;Direct supervision/assist for medications management;Direct supervision/assist for financial  management;Assist for transportation;Help with stairs or ramp for entrance   Equipment Recommendations  None recommended by PT    Recommendations for Other Services       Precautions / Restrictions Precautions Precautions: Fall Restrictions Weight Bearing Restrictions: No     Mobility  Bed Mobility               General bed mobility comments: pt OOB in recliner chair upon PT arrival    Transfers Overall transfer level: Needs assistance Equipment used: None Transfers: Sit to/from Stand Sit to Stand: Min assist, Min guard           General transfer comment: pt able to perform sit<>stand transfers from recliner chair x5 consecutively, progressing from min A for stability to min guard for safety; he used one UE support on the armrest of the chair and one UE support on a straight back chair positioned in front of him for stability    Ambulation/Gait               General Gait Details: deferred secondary to arousal level   Stairs             Wheelchair Mobility    Modified Rankin (Stroke Patients Only)       Balance Overall balance assessment: Needs assistance Sitting-balance support: Feet supported Sitting balance-Leahy Scale: Fair     Standing balance support: During functional activity, Single extremity supported, Bilateral upper extremity supported Standing balance-Leahy Scale: Poor                              Cognition Arousal/Alertness: Lethargic Behavior During Therapy: Flat affect Overall Cognitive Status: Impaired/Different from baseline Area of Impairment: Memory, Problem solving  Memory: Decreased short-term memory       Problem Solving: Decreased initiation, Slow processing          Exercises General Exercises - Lower Extremity Ankle Circles/Pumps: AROM, Both, 10 reps, Seated Long Arc Quad: AROM, Strengthening, Both, 10 reps, Seated Hip Flexion/Marching: Seated, AROM,  Strengthening, Both, 10 reps Other Exercises Other Exercises: 5x sit<>stand, progressing from min A to min guard    General Comments        Pertinent Vitals/Pain Pain Assessment Pain Assessment: No/denies pain    Home Living                          Prior Function            PT Goals (current goals can now be found in the care plan section) Acute Rehab PT Goals PT Goal Formulation: With patient/family Time For Goal Achievement: 08/01/21 Potential to Achieve Goals: Good Progress towards PT goals: Progressing toward goals    Frequency    Min 2X/week      PT Plan Current plan remains appropriate    Co-evaluation              AM-PAC PT "6 Clicks" Mobility   Outcome Measure  Help needed turning from your back to your side while in a flat bed without using bedrails?: A Little Help needed moving from lying on your back to sitting on the side of a flat bed without using bedrails?: A Little Help needed moving to and from a bed to a chair (including a wheelchair)?: A Little Help needed standing up from a chair using your arms (e.g., wheelchair or bedside chair)?: A Little Help needed to walk in hospital room?: A Lot Help needed climbing 3-5 steps with a railing? : Total 6 Click Score: 15    End of Session Equipment Utilized During Treatment: Gait belt Activity Tolerance: Patient limited by fatigue;Patient limited by lethargy Patient left: in chair;with call bell/phone within reach;with chair alarm set Nurse Communication: Mobility status PT Visit Diagnosis: Other abnormalities of gait and mobility (R26.89)     Time: 6789-3810 PT Time Calculation (min) (ACUTE ONLY): 14 min  Charges:  $Therapeutic Activity: 8-22 mins                     Anastasio Champion, DPT  Acute Rehabilitation Services Office [place] 07/22/2021, 11:45 AM

## 2021-07-22 NOTE — Progress Notes (Signed)
Occupational Therapy Treatment Patient Details Name: Nathaniel Hicks MRN: [id number] DOB: 1940-09-28 Today's Date: 07/22/2021   History of present illness 81 yo male who was admitted on 7/3 to [facility] was found to have L side facial swelling and pain from a month long history that stared with odontogenic pain.  Developed swallowing issues with voice changes and was preventatively intubated for airway protection.  Now is thought to have necrotic tissue in L submandibular gland.  On NG tube to decompress, being evaluated to see if abscess is present.  PMHx:  HTN, tobacco use, HLD, OA, testicular CA with R radial orchiectomy, HOH, BPH   OT comments  Pt making steady progress towards OT goals this session. Pt able to ambulate to sink with Rw and min guard assist. Pt stood at sink to complete grooming tasks, with no UE support or LOB. Pt needed MIN verbal cues for Rw mgmt as pt reports he uses rollator at home. Pt would continue to benefit from skilled occupational therapy while admitted and after d/c to address the below listed limitations in order to improve overall functional mobility and facilitate independence with BADL participation. DC plan remains appropriate, will follow acutely per POC.       Recommendations for follow up therapy are one component of a multi-disciplinary discharge planning process, led by the attending physician.  Recommendations may be updated based on patient status, additional functional criteria and insurance authorization.    Follow Up Recommendations  Skilled nursing-short term rehab (<3 hours/day)    Assistance Recommended at Discharge Frequent or constant Supervision/Assistance  Patient can return home with the following  Two people to help with walking and/or transfers;A lot of help with bathing/dressing/bathroom;Assistance with cooking/housework;Direct supervision/assist for medications management;Direct supervision/assist for financial management;Assist for  transportation;Help with stairs or ramp for entrance   Equipment Recommendations  BSC/3in1    Recommendations for Other Services      Precautions / Restrictions Precautions Precautions: Fall Restrictions Weight Bearing Restrictions: No       Mobility Bed Mobility               General bed mobility comments: OOB in recliner    Transfers Overall transfer level: Needs assistance Equipment used: Rolling walker (2 wheels) Transfers: Sit to/from Stand Sit to Stand: Min guard           General transfer comment: min guard to rise from recliner with no LOB, cues for hand placement     Balance Overall balance assessment: Needs assistance Sitting-balance support: Feet supported Sitting balance-Leahy Scale: Fair     Standing balance support: No upper extremity supported, During functional activity Standing balance-Leahy Scale: Fair Standing balance comment: standing at sink for grooming tasks with no LOB                           ADL either performed or assessed with clinical judgement   ADL Overall ADL's : Needs assistance/impaired     Grooming: Wash/dry face;Oral care;Standing;Supervision/safety                   Toilet Transfer: Min guard;Ambulation;Rolling walker (2 wheels) Toilet Transfer Details (indicate cue type and reason): simulated via functional mobility in room         Functional mobility during ADLs: Min guard;Rolling walker (2 wheels);Cueing for safety General ADL Comments: emphasis on OOB activity tolerance, functional mobility and BADL reeducation    Extremity/Trunk Assessment Upper Extremity Assessment Upper Extremity  Assessment: Generalized weakness   Lower Extremity Assessment Lower Extremity Assessment: Defer to PT evaluation        Vision Baseline Vision/History: 0 No visual deficits     Perception Perception Perception: Within Functional Limits   Praxis Praxis Praxis: Intact    Cognition  Arousal/Alertness: Awake/alert Behavior During Therapy: WFL for tasks assessed/performed Overall Cognitive Status: Impaired/Different from baseline Area of Impairment: Safety/judgement, Problem solving                         Safety/Judgement: Decreased awareness of safety, Decreased awareness of deficits   Problem Solving: Slow processing General Comments: mildly slow to process but following all commands, mild decreased safety awareness noted with mobility        Exercises      Shoulder Instructions       General Comments VSS on RA    Pertinent Vitals/ Pain       Pain Assessment Pain Assessment: No/denies pain  Home Living                                          Prior Functioning/Environment              Frequency  Min 2X/week        Progress Toward Goals  OT Goals(current goals can now be found in the care plan section)  Progress towards OT goals: Progressing toward goals  Acute Rehab OT Goals Time For Goal Achievement: 08/01/21 Potential to Achieve Goals: Good  Plan Discharge plan remains appropriate;Frequency remains appropriate    Co-evaluation                 AM-PAC OT "6 Clicks" Daily Activity     Outcome Measure   Help from another person eating meals?: A Little Help from another person taking care of personal grooming?: A Little Help from another person toileting, which includes using toliet, bedpan, or urinal?: A Little Help from another person bathing (including washing, rinsing, drying)?: A Little Help from another person to put on and taking off regular upper body clothing?: None Help from another person to put on and taking off regular lower body clothing?: A Little 6 Click Score: 19    End of Session Equipment Utilized During Treatment: Gait belt;Rolling walker (2 wheels)  OT Visit Diagnosis: Muscle weakness (generalized) (M62.81);Pain   Activity Tolerance Patient tolerated treatment well    Patient Left in chair;with call bell/phone within reach;with chair alarm set   Nurse Communication Mobility status        Time: 1539-1600 OT Time Calculation (min): 21 min  Charges: OT General Charges $OT Visit: 1 Visit OT Treatments $Self Care/Home Management : 8-22 mins  Harley Alto., COTA/L Acute Rehabilitation Services [phone]   Precious Haws 07/22/2021, 4:11 PM

## 2021-07-22 NOTE — NC FL2 (Signed)
[place] LEVEL OF CARE SCREENING TOOL     IDENTIFICATION  Patient Name: Nathaniel Hicks Birthdate: 07-11-1940 Sex: male Admission Date (Current Location): 07/14/2021  [facility] and [place] Number:  Herbalist and Address:  [facility] [place], [address], [place], [place] [address]      Provider Number: [id number]  Attending Physician Name and Address:  Thurnell Lose, MD  Relative Name and Phone Number:       Current Level of Care: Hospital Recommended Level of Care: [place] Prior Approval Number:    Date Approved/Denied:   PASRR Number: [id number]  Discharge Plan: SNF    Current Diagnoses: Patient Active Problem List   Diagnosis Date Noted   Left facial swelling 07/14/2021   BPH with obstruction/lower urinary tract symptoms 11/05/2020   History of primary testicular cancer; Stage 1b seminoma; s/p right radical orchiectomy 12/14 11/05/2020   Hypertension 11/02/2017   Vitamin D deficiency 11/02/2017   Hyperlipidemia 11/02/2017   Meniscus, lateral, derangement, right    S/P right knee arthroscopy 08/31/17 09/07/2017   Testicle cancer, right 12/20/2012    Orientation RESPIRATION BLADDER Height & Weight     Self, Time, Situation, Place  Normal Continent, External catheter Weight: 187 lb 9.8 oz (85.1 kg) Height:  '5\' 11"'$  (180.3 cm)  BEHAVIORAL SYMPTOMS/MOOD NEUROLOGICAL BOWEL NUTRITION STATUS      Continent Diet (See discharge summary.)  AMBULATORY STATUS COMMUNICATION OF NEEDS Skin   Limited Assist Verbally Normal                       Personal Care Assistance Level of Assistance  Bathing, Feeding, Dressing Bathing Assistance: Limited assistance Feeding assistance: Independent Dressing Assistance: Limited assistance     Functional Limitations Info  Sight, Hearing Sight Info: Impaired Hearing Info: Adequate      SPECIAL CARE FACTORS FREQUENCY  PT (By licensed PT), OT (By licensed OT)      PT Frequency: 5x/week OT Frequency: 5x/week            Contractures Contractures Info: Not present    Additional Factors Info  Code Status, Allergies Code Status Info: Full code Allergies Info: No known allergies.           Current Medications (07/22/2021):  This is the current hospital active medication list Current Facility-Administered Medications  Medication Dose Route Frequency Provider Last Rate Last Admin   0.9 %  sodium chloride infusion   Intravenous PRN Thurnell Lose, MD 10 mL/hr at 07/19/21 1735 Infusion Verify at 07/19/21 1735   acetaminophen (TYLENOL) 160 MG/5ML solution 650 mg  650 mg Per Tube Q6H PRN Hunsucker, Bonna Gains, MD   650 mg at 07/19/21 0854   amLODipine (NORVASC) tablet 10 mg  10 mg Per Tube Daily Idamae Schuller, MD   10 mg at 07/22/21 2197   And   benazepril (LOTENSIN) tablet 40 mg  40 mg Per Tube Daily Idamae Schuller, MD   40 mg at 07/22/21 0832   amoxicillin-clavulanate (AUGMENTIN) 875-125 MG per tablet 1 tablet  1 tablet Oral Q12H Thurnell Lose, MD   1 tablet at 07/22/21 0833   enoxaparin (LOVENOX) injection 40 mg  40 mg Subcutaneous Q24H Hunsucker, Bonna Gains, MD   40 mg at 07/22/21 1526   gabapentin (NEURONTIN) capsule 300 mg  300 mg Per Tube BID Idamae Schuller, MD   300 mg at 07/22/21 0833   hydrALAZINE (APRESOLINE) injection 10 mg  10 mg Intravenous Q6H PRN Idamae Schuller, MD       hydrALAZINE (APRESOLINE) tablet 100 mg  100 mg Oral Q8H Thurnell Lose, MD   100 mg at 07/22/21 1348   multivitamin with minerals tablet 1 tablet  1 tablet Oral Daily Thurnell Lose, MD   1 tablet at 07/22/21 0833   ondansetron (ZOFRAN) injection 4 mg  4 mg Intravenous Q6H PRN Lestine Mount, PA-C   4 mg at 07/18/21 0048   Oral care mouth rinse  15 mL Mouth Rinse PRN Thurnell Lose, MD       pantoprazole sodium (PROTONIX) 40 mg/20 mL oral suspension 40 mg  40 mg Per Tube Daily Candee Furbish, MD   40 mg at 07/22/21 0834   pravastatin (PRAVACHOL) tablet 80  mg  80 mg Oral Daily Thurnell Lose, MD   80 mg at 07/21/21 2017   promethazine (PHENERGAN) 6.25 mg in sodium chloride 0.9 % 50 mL IVPB  6.25 mg Intravenous Q6H PRN Nevada Crane M, PA-C       tamsulosin [facility]) capsule 0.4 mg  0.4 mg Oral QPC supper Thurnell Lose, MD   0.4 mg at 07/21/21 1710   vitamin B-12 (CYANOCOBALAMIN) tablet 1,000 mcg  1,000 mcg Oral Daily Thurnell Lose, MD   1,000 mcg at 07/22/21 7014     Discharge Medications: Please see discharge summary for a list of discharge medications.  Relevant Imaging Results:  Relevant Lab Results:   Additional Information SSN: [id number]; COVID 19 Immunizations: Moderna Covid 19 Booster 11/23/2019  Benard Halsted, LCSW

## 2021-07-22 NOTE — Progress Notes (Signed)
Pt, and sister, at bedside.  Sister requesting pt take a shower.  RN stated that a shower for the patient would be a fall risk.  Pt complaining about not getting a bath and feeling "dirty."  RN confirmed with NT that the pt received a bath on 7/10 and refused the bath today because he stated he was going home.  RN and NT spoke with pt and sister to confirm offerings and provide pt support.  Two NTs to bathe pt prior to the end of the shift.

## 2021-07-22 NOTE — Progress Notes (Signed)
Speech Language Pathology Treatment: Dysphagia  Patient Details Name: Nathaniel Hicks MRN: [id number] DOB: 1940-07-02 Today's Date: 07/22/2021 Time: 1420-1430 SLP Time Calculation (min) (ACUTE ONLY): 10 min  Assessment / Plan / Recommendation Clinical Impression  Pt demonstrates significant improvement in mastication and reports no pain. Pt is able to consume regular solids with satisfaction. Can advance to regular/thin and take pills per pts preference. No SLP f/u needed acutely. Depending on pts prognosis and plan of care pt may need f/u with OP SLP per ENT.   HPI HPI: Patient is an 81 y.o. male with PMH: HTN, tobacco abuse, HLD, arthritis, BPH, testicular cancer s/p right radial orchiectomy, HOH. He presented to [facility] ER on 07/14/21 with left sided jaw swelling and odontogenic pain for one month with voice changes, dysphagia and poor PO intake for past 24 hours prior. In ER, temp was 99.9, patient was tachycardic, normotensive and oxygen saturations normal. He was noted to have swelling under left mandible and sublingual areas. CXR negative. CT soft tissues noted soft tissue swelling in the left submandibular region, oropharynx including tongue base, supraglottic larynx and with probably extension into the floor of the mouth. Also noted enlarged contralateral right submandibular node and left submandibular gland; no abscess, and neoplastic etiology not excluded but appearance favors inflammatory etiology.  Concern for airway compromise and patient was electively intubated; transferred to [facility]. He was extubated on 07/16/21 at 1232 and placed on 10L salter. NG tube in place for nutrition; BSE completed on 07/17/21 and placed on thin liquid diet.  ST f/u for diet progression.      SLP Plan  Continue with current plan of care      Recommendations for follow up therapy are one component of a multi-disciplinary discharge planning process, led by the attending physician.  Recommendations may be updated based on  patient status, additional functional criteria and insurance authorization.    Recommendations  Diet recommendations: Regular;Thin liquid Liquids provided via: Cup;Straw Medication Administration: Crushed with puree Supervision: Full supervision/cueing for compensatory strategies;Staff to assist with self feeding Compensations: Slow rate;Small sips/bites Postural Changes and/or Swallow Maneuvers: Seated upright 90 degrees                Oral Care Recommendations: Oral care BID;Staff/trained caregiver to provide oral care Follow Up Recommendations: No SLP follow up Plan: Continue with current plan of care           Rahshawn Remo, Katherene Ponto  07/22/2021, 2:45 PM

## 2021-07-22 NOTE — Progress Notes (Signed)
PROGRESS NOTE                                                                                                                                                                                                             Patient Demographics:    Nathaniel Hicks, is a 81 y.o. male, DOB - 1940-01-22, WUG:[id number]  Outpatient Primary MD for the patient is Celene Squibb, MD    LOS - 8  Admit date - 07/14/2021    Chief Complaint  Patient presents with   Facial Swelling       Brief Narrative (HPI from H&P)   63M with tobacco abuse, hearing loss and testicular cancer s/p orchiectomy who presented to [facility] with a month of left sided jaw pain/swelling with changes to his voice along with dysphagia. CT soft tissue neck showed soft tissue swelling under left mandible and sublingual areas. He has been intubated for airway protection, eventually extubated, he developed some nausea vomiting and required an NG tube placement on 07/17/2021 night.  He was kept on IV antibiotics in ICU for left submandibular infection, he was eventually stabilized and transferred to my service on 07/18/2021.   Subjective:   Patient in bed, appears comfortable, denies any headache, no fever, no chest pain or pressure, no shortness of breath , no abdominal pain. No new focal weakness does feel weak all over, has not been out of the bed in the last 4 days.   Assessment  & Plan :   Left submandibular swelling with infection versus malignant changes.  Underwent preemptive intubation for airway protection, was under ICU care with IV antibiotics, seems to have responded well to antibiotics, swelling improved.  Repeat maxillofacial CT scan done on 07/19/2018 discussed with dental surgeon Dr. Conley Simmonds and ENT surgeon Dr. Redmond Baseman.  Per Dr. Conley Simmonds no dental surgical need.  He was seen by ENT surgeon Dr. Melida Quitter who took him to the OR on 07/19/2026 for biopsy of a mass on the floor of  the mouth with drainage of copious amounts of pus from the left submandibular gland, postprocedure he is doing better and now almost completely symptom-free.  Will switch Unasyn to Augmentin continue Flagyl, continue to monitor cultures if stable likely discharge in the next 1 to 2 days with outpatient follow-up with Dr. Redmond Baseman.    Stable for discharge  and would like to go home unfortunately PT for some reason has not worked with him for 5 days, PT has been consulted earlier this morning and requested to work with the patient, he would not like to go to SNF, he would prefer to go home tomorrow with home health PT if possible.   2.  Nausea vomiting night of 07/17/2021 with NG tube placement.  KUB on 07/18/2021 stable.  Resolved after supportive care.  3.  Weakness and deconditioning.  PT, OT and speech evaluation.  4.  History of testicular cancer s/p orchiectomy.  Outpatient follow-up with PCP and oncology  5.  Dysphagia..  Likely due to #1 above, improving, speech following.  Stable.  6.  Hypertension.  On combination of hydralazine, ACE inhibitor and Norvasc.  Monitor and adjust.  7.  BPH.  Placed on Flomax.  8.  Dyslipidemia.  Placed on statin.  9.  Diarrhea in the presence of scheduled bowel stimulants.  Resolved after supportive care and discontinuation of bowel stimulants.      Condition - Extremely Guarded  Family Communication  :  None present  Code Status :  Full  Consults  :  ENT, PCCM  PUD Prophylaxis : PPI   Procedures  :     ENT Dr. Melida Quitter on 07/18/2021 - Ulcerative mass of floor of mouth.  Granular tissue from periphery sent for biopsy.  Incision to remove tissue resulted in opening of the left submandibular duct with copious flow of yellow pus.  CT - Decreased left submandibular inflammatory changes. Increased dilatation of the left submandibular duct [name]) again without a stone. An occult lesion at the orifice is not excluded. Stable indeterminate enlarged  right submandibular node. Of potential relevance, there is a markedly decayed presumed right mandibular molar.      Disposition Plan  :    Status is: Inpatient  DVT Prophylaxis  :    Place and maintain sequential compression device Start: 07/18/21 1728 enoxaparin (LOVENOX) injection 40 mg Start: 07/14/21 1545    Lab Results  Component Value Date   PLT 253 07/20/2021    Diet :  Diet Order             DIET - DYS 1 Room service appropriate? Yes; Fluid consistency: Thin  Diet effective now                    Inpatient Medications  Scheduled Meds:  amLODipine  10 mg Per Tube Daily   And   benazepril  40 mg Per Tube Daily   amoxicillin-clavulanate  1 tablet Oral Q12H   enoxaparin (LOVENOX) injection  40 mg Subcutaneous Q24H   gabapentin  300 mg Per Tube BID   hydrALAZINE  100 mg Oral Q8H   multivitamin with minerals  1 tablet Oral Daily   pantoprazole sodium  40 mg Per Tube Daily   pravastatin  80 mg Oral Daily   tamsulosin  0.4 mg Oral QPC supper   vitamin B-12  1,000 mcg Oral Daily   Continuous Infusions:  sodium chloride 10 mL/hr at 07/19/21 1735   promethazine (PHENERGAN) injection (IM or IVPB)     PRN Meds:.sodium chloride, acetaminophen (TYLENOL) oral liquid 160 mg/5 mL, hydrALAZINE, ondansetron (ZOFRAN) IV, mouth rinse, promethazine (PHENERGAN) injection (IM or IVPB)  Time Spent in minutes  30   Lala Lund M.D on 07/22/2021 at 11:43 AM  To page go to [email or web address]   [facility] -  Office  [phone]  See  all Orders from today for further details    Objective:   Vitals:   07/21/21 2000 07/21/21 2336 07/22/21 0400 07/22/21 0825  BP: (!) 143/68 (!) 147/64 (!) 154/51 (!) 156/59  Pulse: 83 72 (!) 57 67  Resp: '18 18 15 19  '$ Temp: 98.2 F (36.8 C) 98.4 F (36.9 C) 98.3 F (36.8 C) 98.4 F (36.9 C)  TempSrc: Oral Oral Oral Oral  SpO2: 95% 95% 95% 96%  Weight:      Height:        Wt Readings from Last 3 Encounters:  07/21/21 85.1  kg  01/10/21 86.2 kg  11/19/20 90.4 kg     Intake/Output Summary (Last 24 hours) at 07/22/2021 1143 Last data filed at 07/22/2021 0546 Gross per 24 hour  Intake --  Output 600 ml  Net -600 ml     Physical Exam  Awake Alert, No new F.N deficits, Normal affect [place].AT,PERRAL Supple Neck, No JVD,   Symmetrical Chest wall movement, Good air movement bilaterally, CTAB RRR,No Gallops, Rubs or new Murmurs,  +ve B.Sounds, Abd Soft, No tenderness,   No Cyanosis, Clubbing or edema     Data Review:    CBC Recent Labs  Lab 07/16/21 0117 07/17/21 0343 07/18/21 0044 07/19/21 0054 07/20/21 0626  WBC 16.6* 15.3* 12.1* 13.6* 10.2  HGB 11.8* 13.5 13.8 13.2 12.3*  HCT 35.7* 40.7 42.3 40.3 37.8*  PLT 210 272 279 257 253  MCV 100.3* 100.5* 100.2* 101.3* 101.9*  MCH 33.1 33.3 32.7 33.2 33.2  MCHC 33.1 33.2 32.6 32.8 32.5  RDW 13.2 13.4 13.5 13.4 13.1  LYMPHSABS  --   --   --  1.7 1.4  MONOABS  --   --   --  1.6* 1.1*  EOSABS  --   --   --  0.1 0.2  BASOSABS  --   --   --  0.0 0.0    Electrolytes Recent Labs  Lab 07/16/21 0117 07/17/21 0343 07/18/21 0044 07/19/21 0054 07/20/21 0626 07/20/21 0636  NA 140 141 144 142 140  --   K 4.3 4.1 3.5 4.0 4.1  --   CL 110 114* 110 106 106  --   CO2 19* '22 25 27 26  '$ --   GLUCOSE 131* 153* 168* 105* 103*  --   BUN 23 25* '15 13 13  '$ --   CREATININE 0.98 1.05 0.83 0.84 0.80  --   CALCIUM 9.3 9.5 9.6 9.5 9.1  --   AST  --   --   --  38 31  --   ALT  --   --   --  40 45*  --   ALKPHOS  --   --   --  38 41  --   BILITOT  --   --   --  1.3* 0.7  --   ALBUMIN  --   --   --  2.5* 2.4*  --   MG  --   --   --  2.2 2.0  --   PROCALCITON  --   --   --  <0.10  --  <0.10  BNP  --   --   --  103.1*  --   --     ------------------------------------------------------------------------------------------------------------------ Recent Labs    07/20/21 0626  TRIG 76    No results found for: "HGBA1C"  No results for input(s): "TSH", "T4TOTAL",  "T3FREE", "THYROIDAB" in the last 72 hours.  Invalid input(s): "FREET3" ------------------------------------------------------------------------------------------------------------------ ID  Labs Recent Labs  Lab 07/16/21 0117 07/17/21 0343 07/18/21 0044 07/19/21 0054 07/20/21 0626 07/20/21 0636  WBC 16.6* 15.3* 12.1* 13.6* 10.2  --   PLT 210 272 279 257 253  --   PROCALCITON  --   --   --  <0.10  --  <0.10  CREATININE 0.98 1.05 0.83 0.84 0.80  --    Cardiac Enzymes No results for input(s): "CKMB", "TROPONINI", "MYOGLOBIN" in the last 168 hours.  Invalid input(s): "CK"   Radiology Reports DG Abd Portable 1V  Result Date: 07/19/2021 CLINICAL DATA:  Abdominal pain. EXAM: PORTABLE ABDOMEN - 1 VIEW COMPARISON:  07/18/2021 FINDINGS: NG tube tip is probably transpyloric in the second portion of the duodenum. Diffuse gaseous distention of small bowel and colon evident without small bowel or colonic dilatation. Degenerative changes noted lumbar spine. IMPRESSION: 1. NG tube tip is probably transpyloric in the second portion of the duodenum. 2. Minimal gaseous distention of small bowel and colon. No radiographic features to suggest obstruction or an overt ileus. Electronically Signed   By: Misty Stanley M.D.   On: 07/19/2021 10:30   DG Chest Port 1 View  Result Date: 07/19/2021 CLINICAL DATA:  Shortness of breath. EXAM: PORTABLE CHEST 1 VIEW COMPARISON:  07/14/2021 FINDINGS: There is a enteric tube with tip and side port in the expected location of the proximal duodenum. Stable cardiomediastinal contours. Asymmetric elevation of right hemidiaphragm. No pleural effusion or edema. Mild bibasilar atelectasis. IMPRESSION: 1. Bibasilar atelectasis. 2. Enteric tube tip is in the expected location of the proximal duodenum. Electronically Signed   By: Kerby Moors M.D.   On: 07/19/2021 06:40

## 2021-07-23 DIAGNOSIS — C6211 Malignant neoplasm of descended right testis: Secondary | ICD-10-CM

## 2021-07-23 DIAGNOSIS — F17200 Nicotine dependence, unspecified, uncomplicated: Secondary | ICD-10-CM

## 2021-07-23 DIAGNOSIS — Z8547 Personal history of malignant neoplasm of testis: Secondary | ICD-10-CM

## 2021-07-23 DIAGNOSIS — R778 Other specified abnormalities of plasma proteins: Secondary | ICD-10-CM

## 2021-07-23 DIAGNOSIS — A419 Sepsis, unspecified organism: Secondary | ICD-10-CM

## 2021-07-23 DIAGNOSIS — I10 Essential (primary) hypertension: Secondary | ICD-10-CM | POA: Diagnosis not present

## 2021-07-23 DIAGNOSIS — K122 Cellulitis and abscess of mouth: Secondary | ICD-10-CM

## 2021-07-23 DIAGNOSIS — R220 Localized swelling, mass and lump, head: Secondary | ICD-10-CM | POA: Diagnosis not present

## 2021-07-23 DIAGNOSIS — R5381 Other malaise: Secondary | ICD-10-CM

## 2021-07-23 MED ORDER — ADULT MULTIVITAMIN W/MINERALS CH: 1.0000 | ORAL_TABLET | Freq: Every day | ORAL | Status: DC

## 2021-07-23 MED ORDER — AMOXICILLIN-POT CLAVULANATE 875-125 MG PO TABS: 1.0000 | ORAL_TABLET | Freq: Two times a day (BID) | ORAL | 0 refills | Status: AC

## 2021-07-23 MED ORDER — PANTOPRAZOLE SODIUM 40 MG PO TBEC: 40.0000 mg | DELAYED_RELEASE_TABLET | Freq: Every day | ORAL | 1 refills | Status: DC

## 2021-07-23 NOTE — Progress Notes (Signed)
    Durable Medical Equipment  (From admission, onward)           Start     Ordered   07/23/21 1422  For home use only DME lightweight manual wheelchair with seat cushion  Once       Comments: Patient suffers from Weakness which impairs their ability to perform daily activities like bathing, grooming, and toileting in the home.  A cane or walker will not resolve  issue with performing activities of daily living. A wheelchair will allow patient to safely perform daily activities. Patient is not able to propel themselves in the home using a standard weight wheelchair due to general weakness. Patient can self propel in the lightweight wheelchair. Length of need 12 months . Accessories: elevating leg rests (ELRs), wheel locks, extensions and anti-tippers.   07/23/21 1421   07/23/21 1421  For home use only DME 3 n 1  Once        07/23/21 1421   07/22/21 0606  For home use only DME Walker rolling  Once       Comments: 5 wheel  Question Answer Comment  Walker: With 5 Inch Wheels   Patient needs a walker to treat with the following condition Weakness      07/22/21 0605

## 2021-07-23 NOTE — Discharge Summary (Addendum)
Physician Discharge Summary  Nathaniel Hicks WPY:[id number] DOB: June 19, 1940 DOA: 07/14/2021  PCP: Celene Squibb, MD  Admit date: 07/14/2021 Discharge date: 07/23/2021 Admitted From: Home. Disposition: Home. Recommendations for Outpatient Follow-up:  Follow up with PCP and ENT in 1 week Please CMP and CBC with differential in 1 week Please follow up on the following pending results: None  Home Health: PT/OT/CSW/aide Equipment/Devices: Rolling walker, light wheelchair and 3 in 1 commode  Discharge Condition: Stable CODE STATUS: Full code  Follow-up Information     Melida Quitter, MD. Schedule an appointment as soon as possible for a visit on 07/24/2021.   Specialty: Otolaryngology Contact information: [address] [place] [address] [phone]         Celene Squibb, MD. Schedule an appointment as soon as possible for a visit in 1 week(s).   Specialty: Internal Medicine Contact information: [place] [address] [phone]         [facility] Follow up.   Why: Someone will call you to schedule first home visit. Contact information: [place] [address] [phone]                 Hospital course 81 year old M with PMH of diminished hearing, testicular cancer s/p orchiectomy, tobacco use disorder, HTN and HLD who presented to [facility] ER with left side jaw swelling and odontogenic pain for one month with voice changes, dysphagia, and poor PO intake over the last 24hrs.   In ER, temp 99.9, tachycardic, normotensive and normoxia.  Was noted to have hot-potato voice and firm swelling under left mandible and sublingual areas.  Labs noted for WBC 11.6, CRP 5.5, sed rate 33, lactic 2.5.  CXR neg.  CT soft tissues noted for soft tissue swelling in the left submandibular region, oropharynx including tongue base, supraglottic larynx, and with probable extension into the floor of the mouth.   Also noted enlarged contralateral right submandibular node and left submandibular gland; no abscess, and neoplastic etiology not excluded but appearance favors inflammatory etiology.  Cultures sent and started on cefepime, flagyl, and vancomycin.  Given concern for airway compromise, patient was electively intubated.  Patient to be transferred to [facility], PCCM accepted patient 07/14/21.  Blood cultures remained negative.  Antibiotics de-escalated to IV Unasyn.  He was extubated on 7/6, and transferred to [facility] service on 7/7.   CT maxillofacial with contrast showed decreased left submandibular inflammatory changes, increased dilation of left submandibular duct [name]) without a stone, stable intermediate enlarged right submandibular node and markedly decayed presumed right mandibular molar.  ENT, Dr. Redmond Baseman and oral surgery, Dr. Conley Simmonds consulted.   Per Dr. Conley Simmonds no dental surgical need. Dr. Melida Quitter who took him to the OR on 07/19/79 for biopsy of a mass on the floor of the mouth with drainage of copious amounts of pus from the left submandibular gland.  Postprocedure he is doing better and now almost completely symptom-free.  Antibiotics de-escalated to p.o. Augmentin.  He is discharged on p.o. Augmentin for 8 more days.  Patient has been counseled on tobacco cessation.  Patient was evaluated by therapy who recommended SNF.  However, patient and patient's sister decided to go home with home health and DME.  Outpatient follow-up with PCP and ENT as above.    See individual problem list below for more.   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  Has fever,  tachycardia, tachypnea and leukocytosis on presentation. -S/p I&D. -Discharge on p.o. Augmentin for 8 more days -Outpatient follow-up with oral surgery -Outpatient follow-up with dentist -Encourage smoking cessation  Elevated troponin: Likely demand ischemia in the setting of the above.  Dysphagia: Resolved.  Nausea and vomiting: Resolved.  Physical deconditioning: Therapy recommended SNF but patient and sister decided to go home with home health and DME.  Essential hypertension -Continue home meds  BPH -Continue home meds  Iatrogenic diarrhea: Resolved.  Nutrition Problem: Inadequate oral intake Etiology: inability to eat Signs/Symptoms: NPO status Interventions: MVI     Vital signs Vitals:   07/23/21 0531 07/23/21 0700 07/23/21 0753 07/23/21 1134  BP: (!) 134/55  (!) 150/59 (!) 152/77  Pulse: 78  85 74  Temp: 98.6 F (37 C)  98.2 F (36.8 C) 97.8 F (36.6 C)  Resp: '20  16 19  '$ Height:      Weight:  80.2 kg    SpO2: 100%  100%   TempSrc: Oral  Oral Oral  BMI (Calculated):  24.67       Discharge exam  GENERAL: No apparent distress.  Nontoxic. HEENT: MMM.  Vision and hearing grossly intact.  NECK: Supple.  No apparent JVD.  RESP:  No IWOB.  Fair aeration bilaterally. CVS:  RRR. Heart sounds normal.  ABD/GI/GU: BS+. Abd soft, NTND.  MSK/EXT:  Moves extremities. No apparent deformity. No edema.  SKIN: no apparent skin lesion or wound NEURO: Awake and alert. Oriented appropriately.  No apparent focal neuro deficit. PSYCH: Calm. Normal affect.   Discharge Instructions Discharge Instructions     Call MD for:  difficulty breathing, headache or visual disturbances   Complete by: As directed    Call MD for:  extreme fatigue   Complete by: As directed    Call MD for:  persistant nausea and vomiting   Complete by: As directed    Call MD for:  severe uncontrolled pain   Complete by: As directed    Call MD for:  temperature >100.4   Complete by: As directed     Diet - low sodium heart healthy   Complete by: As directed    Discharge instructions   Complete by: As directed    It has been a pleasure taking care of you!  You were hospitalized due to infection and abscess for which you have been treated surgically and with antibiotics.  Your symptoms improved to the point we think it is safe to let you go home and follow-up with your doctors.  We are discharging you on more antibiotics to complete treatment course.  Please review your new medication list and the directions on your medications before you take them.  It is important that you quit smoking cigarettes.  You may use nicotine patch to help you quit smoking.  Nicotine patch is available over-the-counter.  You may also discuss other options to help you quit smoking with your primary care doctor. You can also talk to professional counselors at 1-800-QUIT-NOW 916-576-2016) for free smoking cessation counseling.     Take care,   Increase activity slowly   Complete by: As directed       Allergies as of 07/23/2021  No Known Allergies      Medication List     TAKE these medications    amLODipine-benazepril 10-40 MG capsule Commonly known as: LOTREL Take 1 capsule by mouth daily.   amoxicillin-clavulanate 875-125 MG tablet Commonly known as: AUGMENTIN Take 1 tablet by mouth every 12 (twelve) hours for 8 days.   gabapentin 300 MG capsule Commonly known as: NEURONTIN Take 300 mg by mouth 2 (two) times daily.   hydrALAZINE 25 MG tablet Commonly known as: APRESOLINE Take 25 mg by mouth 2 (two) times daily.   magnesium oxide 400 MG tablet Commonly known as: MAG-OX Take 400 mg by mouth daily.   multivitamin with minerals Tabs tablet Take 1 tablet by mouth daily. Start taking on: July 24, 2021   oxyCODONE 5 MG immediate release tablet Commonly known as: Oxy IR/ROXICODONE Take 5 mg by mouth 3 (three) times daily as needed for pain.   pantoprazole 40 MG tablet Commonly  known as: Protonix Take 1 tablet (40 mg total) by mouth daily.   pravastatin 40 MG tablet Commonly known as: PRAVACHOL Take 80 mg by mouth daily.   Rapaflo 8 MG Caps capsule Generic drug: silodosin Take 8 mg by mouth daily.   vitamin B-12 1000 MCG tablet Commonly known as: CYANOCOBALAMIN Take 1,000 mcg by mouth daily.   Vitamin D3 1.25 MG (50000 UT) Tabs Take 5,000 Units by mouth daily.               Durable Medical Equipment  (From admission, onward)           Start     Ordered   07/23/21 1422  For home use only DME lightweight manual wheelchair with seat cushion  Once       Comments: Patient suffers from Weakness which impairs their ability to perform daily activities like bathing, grooming, and toileting in the home.  A cane or walker will not resolve  issue with performing activities of daily living. A wheelchair will allow patient to safely perform daily activities. Patient is not able to propel themselves in the home using a standard weight wheelchair due to general weakness. Patient can self propel in the lightweight wheelchair. Length of need 12 months . Accessories: elevating leg rests (ELRs), wheel locks, extensions and anti-tippers.   07/23/21 1421   07/23/21 1421  For home use only DME 3 n 1  Once        07/23/21 1421   07/22/21 0606  For home use only DME Walker rolling  Once       Comments: 5 wheel  Question Answer Comment  Walker: With [place]   Patient needs a walker to treat with the following condition Weakness      07/22/21 0605            Consultations: Pulmonology admitted patient. ENT Dental surgery  Procedures/Studies: I&D of oral floor abscess   DG Abd Portable 1V  Result Date: 07/19/2021 CLINICAL DATA:  Abdominal pain. EXAM: PORTABLE ABDOMEN - 1 VIEW COMPARISON:  07/18/2021 FINDINGS: NG tube tip is probably transpyloric in the second portion of the duodenum. Diffuse gaseous distention of small bowel and colon evident  without small bowel or colonic dilatation. Degenerative changes noted lumbar spine. IMPRESSION: 1. NG tube tip is probably transpyloric in the second portion of the duodenum. 2. Minimal gaseous distention of small bowel and colon. No radiographic features to suggest obstruction or an overt ileus. Electronically Signed   By: Verda Cumins.D.  On: 07/19/2021 10:30   DG Chest Port 1 View  Result Date: 07/19/2021 CLINICAL DATA:  Shortness of breath. EXAM: PORTABLE CHEST 1 VIEW COMPARISON:  07/14/2021 FINDINGS: There is a enteric tube with tip and side port in the expected location of the proximal duodenum. Stable cardiomediastinal contours. Asymmetric elevation of right hemidiaphragm. No pleural effusion or edema. Mild bibasilar atelectasis. IMPRESSION: 1. Bibasilar atelectasis. 2. Enteric tube tip is in the expected location of the proximal duodenum. Electronically Signed   By: Kerby Moors M.D.   On: 07/19/2021 06:40   CT MAXILLOFACIAL W CONTRAST  Result Date: 07/18/2021 CLINICAL DATA:  Sublingual/submandibular abscess EXAM: CT MAXILLOFACIAL WITH CONTRAST TECHNIQUE: Multidetector CT imaging of the maxillofacial structures was performed with intravenous contrast. Multiplanar CT image reconstructions were also generated. RADIATION DOSE REDUCTION: This exam was performed according to the departmental dose-optimization program which includes automated exposure control, adjustment of the mA and/or kV according to patient size and/or use of iterative reconstruction technique. CONTRAST:  22m OMNIPAQUE IOHEXOL 300 MG/ML  SOLN COMPARISON:  CT neck 07/14/2021 FINDINGS: Osseous: Temporomandibular joints are unremarkable. Markedly decayed presumed right mandibular molar. Cervical spine degenerative changes. Orbits: Partially included.  Bilateral lens replacements. Sinuses: Minor mucosal thickening. Soft tissues: Increased dilatation of left submandibular duct ([name]. Similar mild dilatation of the left  intraglandular ducts. No stone. Decreased inflammatory changes in the left submandibular space. Unchanged size of enlarged right submandibular node with mild heterogeneity measuring 1.7 cm in short axis. Nasogastric tube is present. Limited intracranial: No abnormal enhancement. IMPRESSION: Decreased left submandibular inflammatory changes. Increased dilatation of the left submandibular duct ([name] again without a stone. An occult lesion at the orifice is not excluded. Stable indeterminate enlarged right submandibular node. Of potential relevance, there is a markedly decayed presumed right mandibular molar. Electronically Signed   By: PMacy MisM.D.   On: 07/18/2021 09:15   DG Abd Portable 1V  Result Date: 07/18/2021 CLINICAL DATA:  Abdominal distension EXAM: PORTABLE ABDOMEN - 1 VIEW COMPARISON:  07/15/2021 FINDINGS: Gastric catheter extends into the stomach. Scattered large and small bowel gas is again identified. No free air is seen. No acute bony abnormality is noted. IMPRESSION: Gastric catheter within the stomach. No obstructive changes are seen. Electronically Signed   By: MInez CatalinaM.D.   On: 07/18/2021 03:30   DG Abd 1 View  Result Date: 07/15/2021 CLINICAL DATA:  NG tube placement EXAM: ABDOMEN - 1 VIEW COMPARISON:  None Available. FINDINGS: The enteric catheter tip projects over the distal stomach/first portion of the duodenum. There is a nonobstructive bowel gas pattern in the imaged abdomen. Is no definite free intraperitoneal air, within the confines of supine technique. IMPRESSION: Enteric catheter tip is in the distal stomach/first portion of the duodenum. Electronically Signed   By: PValetta MoleM.D.   On: 07/15/2021 12:39   DG Chest Port 1 View  Result Date: 07/14/2021 CLINICAL DATA:  Respiratory failure. EXAM: PORTABLE CHEST 1 VIEW COMPARISON:  07/14/2021 FINDINGS: 1440 hours. Endotracheal tube tip is approximately 3.3 cm above the base of the carina. Low volume film with left  base atelectasis. The cardio pericardial silhouette is enlarged. No airspace pulmonary edema focal lung consolidation. No substantial pleural effusion. Telemetry leads overlie the chest. IMPRESSION: 1. Endotracheal tube tip is 3.3 cm above the base of the carina. 2. Low volume film with left base atelectasis. Electronically Signed   By: EMisty StanleyM.D.   On: 07/14/2021 14:49   DG Chest Port 1V same Day  Result Date: 07/14/2021 CLINICAL DATA:  Intubation. EXAM: PORTABLE CHEST 1 VIEW COMPARISON:  Chest x-ray from same day at 0742 hours. FINDINGS: The patient is rotated to the left. New endotracheal tube with tip 3.8 cm above the carina. Unchanged mild cardiomegaly. No focal consolidation, pleural effusion, or pneumothorax. No acute osseous abnormality. IMPRESSION: 1. Appropriately positioned endotracheal tube. 2. No active disease. Electronically Signed   By: Titus Dubin M.D.   On: 07/14/2021 12:41   CT Soft Tissue Neck W Contrast  Result Date: 07/14/2021 CLINICAL DATA:  Neck mass, nonpulsatile; left jaw pain and swelling EXAM: CT NECK WITH CONTRAST TECHNIQUE: Multidetector CT imaging of the neck was performed using the standard protocol following the bolus administration of intravenous contrast. RADIATION DOSE REDUCTION: This exam was performed according to the departmental dose-optimization program which includes automated exposure control, adjustment of the mA and/or kV according to patient size and/or use of iterative reconstruction technique. CONTRAST:  18m OMNIPAQUE IOHEXOL 300 MG/ML  SOLN COMPARISON:  None Available. FINDINGS: Motion artifact is present. Pharynx and larynx: Oropharyngeal wall thickening eccentric to the left with effacement of the left vallecular. Infiltration of the left parapharyngeal fat. Probable involvement of the left floor of mouth. Asymmetric thickening of the left aryepiglottic fold with effacement of the left piriform sinus. Larynx is unremarkable though there is motion  artifact through this region. Salivary glands: Enlargement of the left submandibular gland. Parotids and the right submandibular gland are unremarkable. Thyroid: Normal. Lymph nodes: Asymmetric enlarged right submandibular node measuring 1.6 cm. Vascular: Major neck vessels are patent. Calcified plaque at the ICA origins. Limited intracranial: No abnormal enhancement. Visualized orbits: Bilateral lens replacements. Otherwise unremarkable. Mastoids and visualized paranasal sinuses: No significant opacification. Skeleton: Advanced degenerative changes of the spine. Upper chest: No apical lung mass. Other: Soft tissue swelling with edema and fascial thickening in the left submandibular region. Right suboccipital posterior neck lipoma. IMPRESSION: Suboptimal evaluation due to motion degradation. Soft tissue swelling in the left submandibular region, oropharynx including tongue base, probable extension into the floor of mouth, and supraglottic larynx. Enlarged contralateral right submandibular node. Enlargement of the left submandibular gland is probably reactive. No abscess. A neoplastic etiology is not entirely excluded given reported length of symptoms, but the appearance favors inflammatory etiology. Electronically Signed   By: PMacy MisM.D.   On: 07/14/2021 09:43   DG Chest Port 1 View  Result Date: 07/14/2021 CLINICAL DATA:  Questionable sepsis - evaluate for abnormality EXAM: PORTABLE CHEST 1 VIEW COMPARISON:  01/10/2021 FINDINGS: Unchanged mildly enlarged cardiac silhouette. There is no focal airspace consolidation. There is no pleural effusion. There is no pneumothorax. There is no acute osseous abnormality. Bilateral shoulder degenerative changes. IMPRESSION: No focal airspace consolidation.  Unchanged cardiomegaly. Electronically Signed   By: JMaurine SimmeringM.D.   On: 07/14/2021 07:51       The results of significant diagnostics from this hospitalization (including imaging, microbiology, ancillary  and laboratory) are listed below for reference.     Microbiology: Recent Results (from the past 240 hour(s))  Blood Culture (routine x 2)     Status: None   Collection Time: 07/14/21  8:39 AM   Specimen: BLOOD RIGHT FOREARM  Result Value Ref Range Status   Specimen Description   Final    BLOOD RIGHT FOREARM BOTTLES DRAWN AEROBIC AND ANAEROBIC   Special Requests Blood Culture adequate volume  Final   Culture   Final    NO GROWTH 5 DAYS Performed at [facility]  [address], [place], [place] [address]    Report Status 07/19/2021 FINAL  Final  Blood Culture (routine x 2)     Status: None   Collection Time: 07/14/21  8:46 AM   Specimen: BLOOD RIGHT HAND  Result Value Ref Range Status   Specimen Description BLOOD RIGHT HAND BOTTLES DRAWN AEROBIC ONLY  Final   Special Requests Blood Culture adequate volume  Final   Culture   Final    NO GROWTH 5 DAYS Performed at [facility], [address] [place], [place] [address]    Report Status 07/19/2021 FINAL  Final  MRSA Next Gen by PCR, Nasal     Status: None   Collection Time: 07/14/21  2:16 PM   Specimen: Nasal Mucosa; Nasal Swab  Result Value Ref Range Status   MRSA by PCR Next Gen NOT DETECTED NOT DETECTED Final    Comment: (NOTE) The GeneXpert MRSA Assay (FDA approved for NASAL specimens only), is one component of a comprehensive MRSA colonization surveillance program. It is not intended to diagnose MRSA infection nor to guide or monitor treatment for MRSA infections. Test performance is not FDA approved in patients less than 25 years old. Performed at [facility], [place] [address], [place], [place] [address]      Labs:  CBC: Recent Labs  Lab 07/17/21 [phone] 07/18/21 0044 07/19/21 0054 07/20/21 0626  WBC 15.3* 12.1* 13.6* 10.2  NEUTROABS  --   --  10.2* 7.4  HGB 13.5 13.8 13.2 12.3*  HCT 40.7 42.3 40.3 37.8*  MCV 100.5* 100.2* 101.3* 101.9*  PLT 272 279 257 253   BMP &GFR Recent Labs  Lab 07/17/21 0343  07/18/21 0044 07/19/21 0054 07/20/21 0626  NA 141 144 142 140  K 4.1 3.5 4.0 4.1  CL 114* 110 106 106  CO2 '22 25 27 26  '$ GLUCOSE 153* 168* 105* 103*  BUN 25* '15 13 13  '$ CREATININE 1.05 0.83 0.84 0.80  CALCIUM 9.5 9.6 9.5 9.1  MG  --   --  2.2 2.0   Estimated Creatinine Clearance: 78.4 mL/min (by C-G formula based on SCr of 0.8 mg/dL). Liver & Pancreas: Recent Labs  Lab 07/19/21 0054 07/20/21 0626  AST 38 31  ALT 40 45*  ALKPHOS 38 41  BILITOT 1.3* 0.7  PROT 6.1* 5.9*  ALBUMIN 2.5* 2.4*   No results for input(s): "LIPASE", "AMYLASE" in the last 168 hours. No results for input(s): "AMMONIA" in the last 168 hours. Diabetic: No results for input(s): "HGBA1C" in the last 72 hours. Recent Labs  Lab 07/21/21 1148 07/21/21 1601 07/21/21 2055 07/22/21 0858 07/22/21 1145  GLUCAP 96 123* 102* 109* 148*   Cardiac Enzymes: No results for input(s): "CKTOTAL", "CKMB", "CKMBINDEX", "TROPONINI" in the last 168 hours. No results for input(s): "PROBNP" in the last 8760 hours. Coagulation Profile: No results for input(s): "INR", "PROTIME" in the last 168 hours. Thyroid Function Tests: No results for input(s): "TSH", "T4TOTAL", "FREET4", "T3FREE", "THYROIDAB" in the last 72 hours. Lipid Profile: No results for input(s): "CHOL", "HDL", "LDLCALC", "TRIG", "CHOLHDL", "LDLDIRECT" in the last 72 hours. Anemia Panel: No results for input(s): "VITAMINB12", "FOLATE", "FERRITIN", "TIBC", "IRON", "RETICCTPCT" in the last 72 hours. Urine analysis:    Component Value Date/Time   COLORURINE YELLOW 07/14/2021 0732   APPEARANCEUR CLEAR 07/14/2021 0732   APPEARANCEUR Clear 05/06/2021 1322   LABSPEC 1.023 07/14/2021 0732   PHURINE 7.0 07/14/2021 0732   GLUCOSEU NEGATIVE 07/14/2021 0732   HGBUR NEGATIVE 07/14/2021 0732  BILIRUBINUR NEGATIVE 07/14/2021 0732   BILIRUBINUR Negative 05/06/2021 1322   KETONESUR NEGATIVE 07/14/2021 0732   PROTEINUR 30 (A) 07/14/2021 0732   NITRITE NEGATIVE  07/14/2021 0732   LEUKOCYTESUR NEGATIVE 07/14/2021 0732   Sepsis Labs: Invalid input(s): "PROCALCITONIN", "LACTICIDVEN"   SIGNED:  Mercy Riding, MD  [facility] 07/23/2021, 7:39 PM

## 2021-07-23 NOTE — TOC Transition Note (Signed)
Transition of Care [facility]) - CM/SW Discharge Note   Patient Details  Name: Nathaniel Hicks MRN: [id number] Date of Birth: August 13, 1940  Transition of Care [facility]) CM/SW Contact:  Tom-Johnson, Renea Ee, RN Phone Number: 07/23/2021, 3:52 PM   Clinical Narrative:     Patient is scheduled for discharge today. Home health referral with [facility] and info on AVS. BSC, RW and Manual Wheelchair ordered from [place] to deliver at bedside. Family to transport at discharge. No further TOC needs noted.   Final next level of care: [place] Barriers to Discharge: Barriers Resolved   Patient Goals and CMS Choice Patient states their goals for this hospitalization and ongoing recovery are:: Rehab CMS Medicare.gov Compare Post Acute Care list provided to:: Patient Represenative (must comment) Choice offered to / list presented to : Patient, Sibling  Discharge Placement                Patient to be transferred to facility by: Family      Discharge Plan and Services In-house Referral: Clinical Social Work Discharge Planning Services: CM Consult Post Acute Care Choice: Home Health          DME Arranged: 3-N-1, Walker rolling, Wheelchair manual DME Agency: [employer] Date DME Agency Contacted: 07/23/21 Time DME Agency Contacted: 1418 Representative spoke with at DME Agency: Jodell Cipro HH Arranged: PT, OT, Speech Therapy, Nurse's Aide [place] Agency: [place] ([place]) Date [place]: 07/23/21 Time [place]: [place] Representative spoke with at [place]: [place] ([place]) Interventions     Readmission Risk Interventions     No data to display

## 2021-07-23 NOTE — TOC Progression Note (Signed)
Transition of Care [facility]) - Progression Note    Patient Details  Name: Nathaniel Hicks MRN: [id number] Date of Birth: 04-22-1940  Transition of Care [facility]) CM/SW [place], LCSW Phone Number: 07/23/2021, 12:05 PM  Clinical Narrative:    CSW met with patient and his sister, [name], at bedside regarding SNF choice. Patient stated that he would like to return home. [name] reviewed SNF list and agreed with home discharge as she stated her and their other sister will be there to take care of patient. CSW reviewed therapy notes with her and she agreed that they would be able to assist patient in his current condition. CSW requested RNCM assist with obtaining home health services. [name] aware that they will likely only come to his apartment 1-2 times per week. CSW confirmed PCP and address.  Patient and sister requesting wheelchair, 3in1 BSC, and walker w/4 wheels be sent to hospital room. Sister will transport patient by car.    Expected Discharge Plan: [place] Barriers to Discharge: Barriers Resolved  Expected Discharge Plan and Services Expected Discharge Plan: [place] In-house Referral: Clinical Social Work Discharge Planning Services: CM Consult Post Acute Care Choice: [place] arrangements for the past 2 months: Apartment Expected Discharge Date: 07/23/21                                     Social Determinants of Health (SDOH) Interventions    Readmission Risk Interventions     No data to display

## 2021-07-23 NOTE — Progress Notes (Signed)
Occupational Therapy Treatment Patient Details Name: Nathaniel Hicks MRN: [id number] DOB: 09/03/1940 Today's Date: 07/23/2021   History of present illness 81 yo male who was admitted on 7/3 to [facility] was found to have L side facial swelling and pain from a month long history that stared with odontogenic pain.  Developed swallowing issues with voice changes and was preventatively intubated for airway protection.  Now is thought to have necrotic tissue in L submandibular gland.  On NG tube to decompress, being evaluated to see if abscess is present.  PMHx:  HTN, tobacco use, HLD, OA, testicular CA with R radial orchiectomy, HOH, BPH   OT comments  Pt and sister choosing for pt to go home. Ambulated with min guard primarily, min assist to navigate in tight spaces and to attend to obstacles on floor. Groomed and dressed for home with set up to min guard assist. Educated pt and sister in multiple uses of 3 in 1, recommended seated showering with assistance. Instructed to keep good lighting, to remove throw rugs and to ensure RW can fit through walkways. Pt and sister verbalized understanding.    Recommendations for follow up therapy are one component of a multi-disciplinary discharge planning process, led by the attending physician.  Recommendations may be updated based on patient status, additional functional criteria and insurance authorization.    Follow Up Recommendations  Home health OT    Assistance Recommended at Discharge Frequent or constant Supervision/Assistance  Patient can return home with the following      Equipment Recommendations  BSC/3in1;Wheelchair cushion (measurements OT);Wheelchair (measurements OT)    Recommendations for Other Services      Precautions / Restrictions Precautions Precautions: Fall Restrictions Weight Bearing Restrictions: No       Mobility Bed Mobility               General bed mobility comments: OOB in recliner    Transfers Overall  transfer level: Needs assistance Equipment used: Rolling walker (2 wheels)   Sit to Stand: Min guard                 Balance Overall balance assessment: Needs assistance Sitting-balance support: Feet supported Sitting balance-Leahy Scale: Good       Standing balance-Leahy Scale: Fair Standing balance comment: standing at sink for grooming task and with sit to stand for dressing                           ADL either performed or assessed with clinical judgement   ADL Overall ADL's : Needs assistance/impaired Eating/Feeding: Independent;Sitting   Grooming: Min guard;Oral care;Standing           Upper Body Dressing : Set up;Sitting   Lower Body Dressing: Min guard;Sit to/from stand               Functional mobility during ADLs: Minimal assistance;Rolling walker (2 wheels) General ADL Comments: assisted to navigate around furniture and cords    Extremity/Trunk Assessment              Vision       Perception     Praxis      Cognition Arousal/Alertness: Awake/alert Behavior During Therapy: WFL for tasks assessed/performed Overall Cognitive Status: Impaired/Different from baseline Area of Impairment: Safety/judgement, Problem solving                         Safety/Judgement: Decreased awareness of safety, Decreased  awareness of deficits   Problem Solving: Slow processing, Requires verbal cues          Exercises      Shoulder Instructions       General Comments      Pertinent Vitals/ Pain       Pain Assessment Pain Assessment: No/denies pain  Home Living                                          Prior Functioning/Environment              Frequency  Min 2X/week        Progress Toward Goals  OT Goals(current goals can now be found in the care plan section)  Progress towards OT goals: Progressing toward goals  Acute Rehab OT Goals OT Goal Formulation: With patient Time For Goal  Achievement: 08/01/21 Potential to Achieve Goals: Good  Plan Discharge plan needs to be updated    Co-evaluation                 AM-PAC OT "6 Clicks" Daily Activity     Outcome Measure   Help from another person eating meals?: None Help from another person taking care of personal grooming?: A Little Help from another person toileting, which includes using toliet, bedpan, or urinal?: A Little Help from another person bathing (including washing, rinsing, drying)?: A Little Help from another person to put on and taking off regular upper body clothing?: A Little Help from another person to put on and taking off regular lower body clothing?: A Little 6 Click Score: 19    End of Session Equipment Utilized During Treatment: Gait belt;Rolling walker (2 wheels)  OT Visit Diagnosis: Muscle weakness (generalized) (M62.81);Pain;Other abnormalities of gait and mobility (R26.89);Unsteadiness on feet (R26.81)   Activity Tolerance Patient tolerated treatment well   Patient Left in chair;with call bell/phone within reach;with chair alarm set;with family/visitor present   Nurse Communication          Time: 2263-3354 OT Time Calculation (min): 39 min  Charges: OT General Charges $OT Visit: 1 Visit OT Treatments $Self Care/Home Management : 38-52 mins  Cleta Alberts, OTR/L Acute Rehabilitation Services Office: [phone]   Malka So 07/23/2021, 3:38 PM

## 2021-07-25 DIAGNOSIS — K1121 Acute sialoadenitis: Secondary | ICD-10-CM | POA: Diagnosis not present

## 2021-07-25 DIAGNOSIS — E785 Hyperlipidemia, unspecified: Secondary | ICD-10-CM | POA: Diagnosis not present

## 2021-07-25 DIAGNOSIS — K1379 Other lesions of oral mucosa: Secondary | ICD-10-CM | POA: Diagnosis not present

## 2021-07-25 DIAGNOSIS — F17210 Nicotine dependence, cigarettes, uncomplicated: Secondary | ICD-10-CM | POA: Diagnosis not present

## 2021-07-25 DIAGNOSIS — R1310 Dysphagia, unspecified: Secondary | ICD-10-CM | POA: Diagnosis not present

## 2021-07-25 DIAGNOSIS — N138 Other obstructive and reflux uropathy: Secondary | ICD-10-CM | POA: Diagnosis not present

## 2021-07-25 DIAGNOSIS — H9190 Unspecified hearing loss, unspecified ear: Secondary | ICD-10-CM | POA: Diagnosis not present

## 2021-07-25 DIAGNOSIS — J9601 Acute respiratory failure with hypoxia: Secondary | ICD-10-CM | POA: Diagnosis not present

## 2021-07-25 DIAGNOSIS — I10 Essential (primary) hypertension: Secondary | ICD-10-CM | POA: Diagnosis not present

## 2021-07-25 DIAGNOSIS — Z9181 History of falling: Secondary | ICD-10-CM | POA: Diagnosis not present

## 2021-07-25 DIAGNOSIS — M1990 Unspecified osteoarthritis, unspecified site: Secondary | ICD-10-CM | POA: Diagnosis not present

## 2021-07-25 DIAGNOSIS — E559 Vitamin D deficiency, unspecified: Secondary | ICD-10-CM | POA: Diagnosis not present

## 2021-07-25 DIAGNOSIS — Z79891 Long term (current) use of opiate analgesic: Secondary | ICD-10-CM | POA: Diagnosis not present

## 2021-07-25 DIAGNOSIS — K113 Abscess of salivary gland: Secondary | ICD-10-CM | POA: Diagnosis not present

## 2021-07-28 DIAGNOSIS — K113 Abscess of salivary gland: Secondary | ICD-10-CM | POA: Diagnosis not present

## 2021-07-28 DIAGNOSIS — I10 Essential (primary) hypertension: Secondary | ICD-10-CM | POA: Diagnosis not present

## 2021-07-28 DIAGNOSIS — Z79891 Long term (current) use of opiate analgesic: Secondary | ICD-10-CM | POA: Diagnosis not present

## 2021-07-28 DIAGNOSIS — J9601 Acute respiratory failure with hypoxia: Secondary | ICD-10-CM | POA: Diagnosis not present

## 2021-07-28 DIAGNOSIS — E785 Hyperlipidemia, unspecified: Secondary | ICD-10-CM | POA: Diagnosis not present

## 2021-07-28 DIAGNOSIS — M1990 Unspecified osteoarthritis, unspecified site: Secondary | ICD-10-CM | POA: Diagnosis not present

## 2021-07-28 DIAGNOSIS — N138 Other obstructive and reflux uropathy: Secondary | ICD-10-CM | POA: Diagnosis not present

## 2021-07-28 DIAGNOSIS — F17210 Nicotine dependence, cigarettes, uncomplicated: Secondary | ICD-10-CM | POA: Diagnosis not present

## 2021-07-28 DIAGNOSIS — K1379 Other lesions of oral mucosa: Secondary | ICD-10-CM | POA: Diagnosis not present

## 2021-07-28 DIAGNOSIS — H9190 Unspecified hearing loss, unspecified ear: Secondary | ICD-10-CM | POA: Diagnosis not present

## 2021-07-28 DIAGNOSIS — E559 Vitamin D deficiency, unspecified: Secondary | ICD-10-CM | POA: Diagnosis not present

## 2021-07-28 DIAGNOSIS — Z9181 History of falling: Secondary | ICD-10-CM | POA: Diagnosis not present

## 2021-07-28 DIAGNOSIS — K1121 Acute sialoadenitis: Secondary | ICD-10-CM | POA: Diagnosis not present

## 2021-07-28 DIAGNOSIS — R1310 Dysphagia, unspecified: Secondary | ICD-10-CM | POA: Diagnosis not present

## 2021-07-29 DIAGNOSIS — Z9181 History of falling: Secondary | ICD-10-CM | POA: Diagnosis not present

## 2021-07-29 DIAGNOSIS — I10 Essential (primary) hypertension: Secondary | ICD-10-CM | POA: Diagnosis not present

## 2021-07-29 DIAGNOSIS — N138 Other obstructive and reflux uropathy: Secondary | ICD-10-CM | POA: Diagnosis not present

## 2021-07-29 DIAGNOSIS — M1990 Unspecified osteoarthritis, unspecified site: Secondary | ICD-10-CM | POA: Diagnosis not present

## 2021-07-29 DIAGNOSIS — K1379 Other lesions of oral mucosa: Secondary | ICD-10-CM | POA: Diagnosis not present

## 2021-07-29 DIAGNOSIS — K113 Abscess of salivary gland: Secondary | ICD-10-CM | POA: Diagnosis not present

## 2021-07-29 DIAGNOSIS — E559 Vitamin D deficiency, unspecified: Secondary | ICD-10-CM | POA: Diagnosis not present

## 2021-07-29 DIAGNOSIS — E785 Hyperlipidemia, unspecified: Secondary | ICD-10-CM | POA: Diagnosis not present

## 2021-07-29 DIAGNOSIS — J9601 Acute respiratory failure with hypoxia: Secondary | ICD-10-CM | POA: Diagnosis not present

## 2021-07-29 DIAGNOSIS — Z79891 Long term (current) use of opiate analgesic: Secondary | ICD-10-CM | POA: Diagnosis not present

## 2021-07-29 DIAGNOSIS — R1310 Dysphagia, unspecified: Secondary | ICD-10-CM | POA: Diagnosis not present

## 2021-07-29 DIAGNOSIS — F17210 Nicotine dependence, cigarettes, uncomplicated: Secondary | ICD-10-CM | POA: Diagnosis not present

## 2021-07-29 DIAGNOSIS — K1121 Acute sialoadenitis: Secondary | ICD-10-CM | POA: Diagnosis not present

## 2021-07-29 DIAGNOSIS — H9190 Unspecified hearing loss, unspecified ear: Secondary | ICD-10-CM | POA: Diagnosis not present

## 2021-07-30 DIAGNOSIS — I10 Essential (primary) hypertension: Secondary | ICD-10-CM | POA: Diagnosis not present

## 2021-07-30 DIAGNOSIS — K113 Abscess of salivary gland: Secondary | ICD-10-CM | POA: Diagnosis not present

## 2021-07-30 DIAGNOSIS — R1310 Dysphagia, unspecified: Secondary | ICD-10-CM | POA: Diagnosis not present

## 2021-07-30 DIAGNOSIS — M1990 Unspecified osteoarthritis, unspecified site: Secondary | ICD-10-CM | POA: Diagnosis not present

## 2021-07-30 DIAGNOSIS — K1121 Acute sialoadenitis: Secondary | ICD-10-CM | POA: Diagnosis not present

## 2021-07-30 DIAGNOSIS — Z79891 Long term (current) use of opiate analgesic: Secondary | ICD-10-CM | POA: Diagnosis not present

## 2021-07-30 DIAGNOSIS — H9190 Unspecified hearing loss, unspecified ear: Secondary | ICD-10-CM | POA: Diagnosis not present

## 2021-07-30 DIAGNOSIS — F17210 Nicotine dependence, cigarettes, uncomplicated: Secondary | ICD-10-CM | POA: Diagnosis not present

## 2021-07-30 DIAGNOSIS — N138 Other obstructive and reflux uropathy: Secondary | ICD-10-CM | POA: Diagnosis not present

## 2021-07-30 DIAGNOSIS — E559 Vitamin D deficiency, unspecified: Secondary | ICD-10-CM | POA: Diagnosis not present

## 2021-07-30 DIAGNOSIS — K1379 Other lesions of oral mucosa: Secondary | ICD-10-CM | POA: Diagnosis not present

## 2021-07-30 DIAGNOSIS — E785 Hyperlipidemia, unspecified: Secondary | ICD-10-CM | POA: Diagnosis not present

## 2021-07-30 DIAGNOSIS — J9601 Acute respiratory failure with hypoxia: Secondary | ICD-10-CM | POA: Diagnosis not present

## 2021-07-30 DIAGNOSIS — Z9181 History of falling: Secondary | ICD-10-CM | POA: Diagnosis not present

## 2021-07-31 DIAGNOSIS — E559 Vitamin D deficiency, unspecified: Secondary | ICD-10-CM | POA: Diagnosis not present

## 2021-07-31 DIAGNOSIS — K1379 Other lesions of oral mucosa: Secondary | ICD-10-CM | POA: Diagnosis not present

## 2021-07-31 DIAGNOSIS — I10 Essential (primary) hypertension: Secondary | ICD-10-CM | POA: Diagnosis not present

## 2021-07-31 DIAGNOSIS — K113 Abscess of salivary gland: Secondary | ICD-10-CM | POA: Diagnosis not present

## 2021-07-31 DIAGNOSIS — F17210 Nicotine dependence, cigarettes, uncomplicated: Secondary | ICD-10-CM | POA: Diagnosis not present

## 2021-07-31 DIAGNOSIS — J9601 Acute respiratory failure with hypoxia: Secondary | ICD-10-CM | POA: Diagnosis not present

## 2021-07-31 DIAGNOSIS — Z79891 Long term (current) use of opiate analgesic: Secondary | ICD-10-CM | POA: Diagnosis not present

## 2021-07-31 DIAGNOSIS — E785 Hyperlipidemia, unspecified: Secondary | ICD-10-CM | POA: Diagnosis not present

## 2021-07-31 DIAGNOSIS — M1990 Unspecified osteoarthritis, unspecified site: Secondary | ICD-10-CM | POA: Diagnosis not present

## 2021-07-31 DIAGNOSIS — R1310 Dysphagia, unspecified: Secondary | ICD-10-CM | POA: Diagnosis not present

## 2021-07-31 DIAGNOSIS — Z9181 History of falling: Secondary | ICD-10-CM | POA: Diagnosis not present

## 2021-07-31 DIAGNOSIS — N138 Other obstructive and reflux uropathy: Secondary | ICD-10-CM | POA: Diagnosis not present

## 2021-07-31 DIAGNOSIS — K1121 Acute sialoadenitis: Secondary | ICD-10-CM | POA: Diagnosis not present

## 2021-07-31 DIAGNOSIS — H9190 Unspecified hearing loss, unspecified ear: Secondary | ICD-10-CM | POA: Diagnosis not present

## 2021-08-04 DIAGNOSIS — M1990 Unspecified osteoarthritis, unspecified site: Secondary | ICD-10-CM | POA: Diagnosis not present

## 2021-08-04 DIAGNOSIS — H9190 Unspecified hearing loss, unspecified ear: Secondary | ICD-10-CM | POA: Diagnosis not present

## 2021-08-04 DIAGNOSIS — Z9181 History of falling: Secondary | ICD-10-CM | POA: Diagnosis not present

## 2021-08-04 DIAGNOSIS — Z79891 Long term (current) use of opiate analgesic: Secondary | ICD-10-CM | POA: Diagnosis not present

## 2021-08-04 DIAGNOSIS — F17210 Nicotine dependence, cigarettes, uncomplicated: Secondary | ICD-10-CM | POA: Diagnosis not present

## 2021-08-04 DIAGNOSIS — E559 Vitamin D deficiency, unspecified: Secondary | ICD-10-CM | POA: Diagnosis not present

## 2021-08-04 DIAGNOSIS — J9601 Acute respiratory failure with hypoxia: Secondary | ICD-10-CM | POA: Diagnosis not present

## 2021-08-04 DIAGNOSIS — K1379 Other lesions of oral mucosa: Secondary | ICD-10-CM | POA: Diagnosis not present

## 2021-08-04 DIAGNOSIS — E785 Hyperlipidemia, unspecified: Secondary | ICD-10-CM | POA: Diagnosis not present

## 2021-08-04 DIAGNOSIS — I10 Essential (primary) hypertension: Secondary | ICD-10-CM | POA: Diagnosis not present

## 2021-08-04 DIAGNOSIS — K1121 Acute sialoadenitis: Secondary | ICD-10-CM | POA: Diagnosis not present

## 2021-08-04 DIAGNOSIS — R1310 Dysphagia, unspecified: Secondary | ICD-10-CM | POA: Diagnosis not present

## 2021-08-04 DIAGNOSIS — K113 Abscess of salivary gland: Secondary | ICD-10-CM | POA: Diagnosis not present

## 2021-08-04 DIAGNOSIS — N138 Other obstructive and reflux uropathy: Secondary | ICD-10-CM | POA: Diagnosis not present

## 2021-08-07 DIAGNOSIS — R221 Localized swelling, mass and lump, neck: Secondary | ICD-10-CM | POA: Diagnosis not present

## 2021-08-07 DIAGNOSIS — R7301 Impaired fasting glucose: Secondary | ICD-10-CM | POA: Diagnosis not present

## 2021-08-07 DIAGNOSIS — I10 Essential (primary) hypertension: Secondary | ICD-10-CM | POA: Diagnosis not present

## 2021-08-07 DIAGNOSIS — Z0189 Encounter for other specified special examinations: Secondary | ICD-10-CM | POA: Diagnosis not present

## 2021-08-08 DIAGNOSIS — Z9181 History of falling: Secondary | ICD-10-CM | POA: Diagnosis not present

## 2021-08-08 DIAGNOSIS — J9601 Acute respiratory failure with hypoxia: Secondary | ICD-10-CM | POA: Diagnosis not present

## 2021-08-08 DIAGNOSIS — F17210 Nicotine dependence, cigarettes, uncomplicated: Secondary | ICD-10-CM | POA: Diagnosis not present

## 2021-08-08 DIAGNOSIS — E559 Vitamin D deficiency, unspecified: Secondary | ICD-10-CM | POA: Diagnosis not present

## 2021-08-08 DIAGNOSIS — K1379 Other lesions of oral mucosa: Secondary | ICD-10-CM | POA: Diagnosis not present

## 2021-08-08 DIAGNOSIS — K1121 Acute sialoadenitis: Secondary | ICD-10-CM | POA: Diagnosis not present

## 2021-08-08 DIAGNOSIS — E785 Hyperlipidemia, unspecified: Secondary | ICD-10-CM | POA: Diagnosis not present

## 2021-08-08 DIAGNOSIS — M1990 Unspecified osteoarthritis, unspecified site: Secondary | ICD-10-CM | POA: Diagnosis not present

## 2021-08-08 DIAGNOSIS — K113 Abscess of salivary gland: Secondary | ICD-10-CM | POA: Diagnosis not present

## 2021-08-08 DIAGNOSIS — I10 Essential (primary) hypertension: Secondary | ICD-10-CM | POA: Diagnosis not present

## 2021-08-08 DIAGNOSIS — N138 Other obstructive and reflux uropathy: Secondary | ICD-10-CM | POA: Diagnosis not present

## 2021-08-08 DIAGNOSIS — R1310 Dysphagia, unspecified: Secondary | ICD-10-CM | POA: Diagnosis not present

## 2021-08-08 DIAGNOSIS — Z79891 Long term (current) use of opiate analgesic: Secondary | ICD-10-CM | POA: Diagnosis not present

## 2021-08-08 DIAGNOSIS — H9190 Unspecified hearing loss, unspecified ear: Secondary | ICD-10-CM | POA: Diagnosis not present

## 2021-08-12 LAB — SURGICAL PATHOLOGY

## 2021-08-13 DIAGNOSIS — K1121 Acute sialoadenitis: Secondary | ICD-10-CM | POA: Diagnosis not present

## 2021-08-13 DIAGNOSIS — E785 Hyperlipidemia, unspecified: Secondary | ICD-10-CM | POA: Diagnosis not present

## 2021-08-13 DIAGNOSIS — E559 Vitamin D deficiency, unspecified: Secondary | ICD-10-CM | POA: Diagnosis not present

## 2021-08-13 DIAGNOSIS — N138 Other obstructive and reflux uropathy: Secondary | ICD-10-CM | POA: Diagnosis not present

## 2021-08-13 DIAGNOSIS — F17210 Nicotine dependence, cigarettes, uncomplicated: Secondary | ICD-10-CM | POA: Diagnosis not present

## 2021-08-13 DIAGNOSIS — K1379 Other lesions of oral mucosa: Secondary | ICD-10-CM | POA: Diagnosis not present

## 2021-08-13 DIAGNOSIS — J9601 Acute respiratory failure with hypoxia: Secondary | ICD-10-CM | POA: Diagnosis not present

## 2021-08-13 DIAGNOSIS — R1310 Dysphagia, unspecified: Secondary | ICD-10-CM | POA: Diagnosis not present

## 2021-08-13 DIAGNOSIS — Z79891 Long term (current) use of opiate analgesic: Secondary | ICD-10-CM | POA: Diagnosis not present

## 2021-08-13 DIAGNOSIS — K113 Abscess of salivary gland: Secondary | ICD-10-CM | POA: Diagnosis not present

## 2021-08-13 DIAGNOSIS — M1990 Unspecified osteoarthritis, unspecified site: Secondary | ICD-10-CM | POA: Diagnosis not present

## 2021-08-13 DIAGNOSIS — I10 Essential (primary) hypertension: Secondary | ICD-10-CM | POA: Diagnosis not present

## 2021-08-13 DIAGNOSIS — H9190 Unspecified hearing loss, unspecified ear: Secondary | ICD-10-CM | POA: Diagnosis not present

## 2021-08-13 DIAGNOSIS — Z9181 History of falling: Secondary | ICD-10-CM | POA: Diagnosis not present

## 2021-08-14 DIAGNOSIS — Z79891 Long term (current) use of opiate analgesic: Secondary | ICD-10-CM | POA: Diagnosis not present

## 2021-08-14 DIAGNOSIS — J9601 Acute respiratory failure with hypoxia: Secondary | ICD-10-CM | POA: Diagnosis not present

## 2021-08-14 DIAGNOSIS — H9190 Unspecified hearing loss, unspecified ear: Secondary | ICD-10-CM | POA: Diagnosis not present

## 2021-08-14 DIAGNOSIS — K1379 Other lesions of oral mucosa: Secondary | ICD-10-CM | POA: Diagnosis not present

## 2021-08-14 DIAGNOSIS — R1310 Dysphagia, unspecified: Secondary | ICD-10-CM | POA: Diagnosis not present

## 2021-08-14 DIAGNOSIS — I10 Essential (primary) hypertension: Secondary | ICD-10-CM | POA: Diagnosis not present

## 2021-08-14 DIAGNOSIS — K113 Abscess of salivary gland: Secondary | ICD-10-CM | POA: Diagnosis not present

## 2021-08-14 DIAGNOSIS — M1990 Unspecified osteoarthritis, unspecified site: Secondary | ICD-10-CM | POA: Diagnosis not present

## 2021-08-14 DIAGNOSIS — Z9181 History of falling: Secondary | ICD-10-CM | POA: Diagnosis not present

## 2021-08-14 DIAGNOSIS — E785 Hyperlipidemia, unspecified: Secondary | ICD-10-CM | POA: Diagnosis not present

## 2021-08-14 DIAGNOSIS — N138 Other obstructive and reflux uropathy: Secondary | ICD-10-CM | POA: Diagnosis not present

## 2021-08-14 DIAGNOSIS — E559 Vitamin D deficiency, unspecified: Secondary | ICD-10-CM | POA: Diagnosis not present

## 2021-08-14 DIAGNOSIS — K1121 Acute sialoadenitis: Secondary | ICD-10-CM | POA: Diagnosis not present

## 2021-08-14 DIAGNOSIS — F17210 Nicotine dependence, cigarettes, uncomplicated: Secondary | ICD-10-CM | POA: Diagnosis not present

## 2021-08-15 DIAGNOSIS — G629 Polyneuropathy, unspecified: Secondary | ICD-10-CM | POA: Diagnosis not present

## 2021-08-15 DIAGNOSIS — M545 Low back pain, unspecified: Secondary | ICD-10-CM | POA: Diagnosis not present

## 2021-08-15 DIAGNOSIS — D1730 Benign lipomatous neoplasm of skin and subcutaneous tissue of unspecified sites: Secondary | ICD-10-CM | POA: Diagnosis not present

## 2021-08-15 DIAGNOSIS — I10 Essential (primary) hypertension: Secondary | ICD-10-CM | POA: Diagnosis not present

## 2021-08-15 DIAGNOSIS — E782 Mixed hyperlipidemia: Secondary | ICD-10-CM | POA: Diagnosis not present

## 2021-08-15 DIAGNOSIS — M1990 Unspecified osteoarthritis, unspecified site: Secondary | ICD-10-CM | POA: Diagnosis not present

## 2021-08-15 DIAGNOSIS — R252 Cramp and spasm: Secondary | ICD-10-CM | POA: Diagnosis not present

## 2021-08-15 DIAGNOSIS — D539 Nutritional anemia, unspecified: Secondary | ICD-10-CM | POA: Diagnosis not present

## 2021-08-15 DIAGNOSIS — R509 Fever, unspecified: Secondary | ICD-10-CM | POA: Diagnosis not present

## 2021-08-15 DIAGNOSIS — E559 Vitamin D deficiency, unspecified: Secondary | ICD-10-CM | POA: Diagnosis not present

## 2021-08-18 DIAGNOSIS — C049 Malignant neoplasm of floor of mouth, unspecified: Secondary | ICD-10-CM | POA: Diagnosis not present

## 2021-08-27 DIAGNOSIS — C049 Malignant neoplasm of floor of mouth, unspecified: Secondary | ICD-10-CM | POA: Diagnosis not present

## 2021-09-12 DIAGNOSIS — R531 Weakness: Secondary | ICD-10-CM | POA: Diagnosis not present

## 2021-10-08 ENCOUNTER — Other Ambulatory Visit: Payer: Self-pay | Admitting: Otolaryngology

## 2021-10-10 ENCOUNTER — Other Ambulatory Visit: Payer: Self-pay

## 2021-10-10 ENCOUNTER — Encounter (HOSPITAL_COMMUNITY): Payer: Self-pay | Admitting: Otolaryngology

## 2021-10-10 NOTE — Progress Notes (Signed)
S.D.W- Instructions   Your procedure is scheduled on Tues., Oct. 3, 2023 from 7:30AM-1:30PM.  Report to [facility] Main Entrance "A" at 5:30 A.M., then check in with the Admitting office.  Call this number if you have problems the morning of surgery:  [phone]   Remember:  Do not eat or drink after midnight on Oct. 2nd   Take these medicines the morning of surgery with A SIP OF WATER: Gabapentin (NEURONTIN) HydrALAZINE (APRESOLINE)  Pravastatin (PRAVACHOL) RAPAFLO   If Needed: Ketotifen (ZADITOR) OxyCODONE (OXY IR/ROXICODONE)  As of today, STOP taking any Aspirin (unless otherwise instructed by your surgeon) Aleve, Naproxen, Ibuprofen, Motrin, Advil, Goody's, BC's, all herbal medications, fish oil, and all vitamins.          Do not wear jewelry. Do not wear lotions, powders, cologne or deodorant. Do not shave 48 hours prior to surgery.  Men may shave face and neck. Do not bring valuables to the hospital.  [facility] is not responsible for any belongings or valuables.    Do NOT Smoke (Tobacco/Vaping)  24 hours prior to your procedure  If you use a CPAP at night, you may bring your mask for your overnight stay.   Contacts, glasses, hearing aids, dentures or partials may not be worn into surgery, please bring cases for these belongings   For patients admitted to the hospital, discharge time will be determined by your treatment team.   Patients discharged the day of surgery will not be allowed to drive home, and someone needs to stay with them for 24 hours.  Special instructions:    Oral Hygiene is also important to reduce your risk of infection.  Remember - BRUSH YOUR TEETH THE MORNING OF SURGERY WITH YOUR REGULAR TOOTHPASTE  [facility]- Preparing For [facility]  Before surgery, you can play an important role. Because skin is not sterile, your skin needs to be as free of germs as possible. You can reduce the number of germs on your skin by washing with Antibacterial Soap  before surgery.     Please follow these instructions carefully.     Shower the NIGHT BEFORE SURGERY and the MORNING OF SURGERY with Antibacterial Soap.   Pat yourself dry with a CLEAN TOWEL.  Wear CLEAN PAJAMAS to bed the night before surgery  Place CLEAN SHEETS on your bed the night before your surgery  DO NOT SLEEP WITH PETS.  Day of Surgery:  Take a shower with Antibacterial soap. Wear Clean/Comfortable clothing the morning of surgery Do not apply any deodorants/lotions.   Remember to brush your teeth WITH YOUR REGULAR TOOTHPASTE.   If you test positive for Covid, or been in contact with anyone that has tested positive in the last 10 days, please notify your surgeon.  SURGICAL WAITING ROOM VISITATION Patients having surgery or a procedure may have no more than 2 support people in the waiting area - these visitors may rotate.   Children under the age of 67 must have an adult with them who is not the patient. If the patient needs to stay at the hospital during part of their recovery, the visitor guidelines for inpatient rooms apply. Pre-op nurse will coordinate an appropriate time for 1 support person to accompany patient in pre-op.  This support person may not rotate.   Please refer to the [facility] website for the visitor guidelines for Inpatients (after your surgery is over and you are in a regular room).

## 2021-10-10 NOTE — Progress Notes (Signed)
PCP - Dr. Merlyn Albert  Cardiologist - Denies  EP- Denies  Endocrine- Denies  Pulm- Denies  Chest x-ray - 07/19/21 (E)  EKG - 07/18/21 (E)  Stress Test - Denies  ECHO - Denies  Cardiac Cath - Denies  AICD-na PM-na LOOP-na  Nerve Stimulator- Denies  Dialysis- Denies  Sleep Study - Denies CPAP - Denies  LABS- 10/14/21: CBC, BMP, COVID  ASA- Denies  ERAS- No  HA1C- Denies  Anesthesia- Yes- hx of difficult intubation  Pt denies having chest pain, sob, or fever during the pre-op phone call. All instructions explained to the pt, with a verbal understanding of the material. Pt also instructed to wear a mask and social distance is he goes out. The opportunity to ask questions was provided.

## 2021-10-13 NOTE — Progress Notes (Signed)
Anesthesia Chart Review: Kathleene Hazel  Case: [id number] Date/Time: 10/14/21 0715   Procedures:      FLOOR OF MOUTH RESECTION     NECK DISSECTION (Bilateral)     PLATYSMA FLAP CLOSURE (Bilateral)     TRACHEOSTOMY   Anesthesia type: General   Pre-op diagnosis: CA   Location: [place] OR ROOM 08 / [place] OR   Surgeons: Melida Quitter, MD       DISCUSSION: Patient is an 81 year old male scheduled for the above procedure. 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 He initially presented to [facility] with odontogenic pain x 1 month with progressive dysphagia and dysphonia with facial swelling x 1 day. HR 125 bpm, RR 27, O2 sat 93%, T 99.9 F. CT showed sublingual swelling versus mass. WBC 11.6. Lactic acid elevated at 2.5. ENT thought likely infectious, but if not improving would need to rule out malignancy. He was started on IV antibiotics and steroids. Due to concern for airway edema, he was intubated by pulmonologist Dr. Melvyn Novas prior to transfer to [facility] ICU. He had intermittent bradycardia (one as low as 36 bpm with wide QRS, deep T wave inversions that resolved on follow-up tracing) on sedation and had periods where Versed, propofol, and/or Precedex were held or discontinued. He was felt safe to extubated on 07/16/21. ST evaluation 76/23 and felt mild aspiration risk, thin liquids okay 07/17/21. Maxillofacial surgeon Dr.  Conley Simmonds contacted on 07/17/21 and recommended to repeat CT imaging, as he felt swelling was more likely from a necrotic nodule/neoplastic process since he had no posterior teeth on the left side. 07/18/21 CT showed a decreased in left submandibular inflammatory changes, increased dilatation of left submandibular duct without stone, cannot exclude occult lesion, stable indeterminate enlarged right submandibular node with markedly decayed presume right mandibular molar. ENT Dr. Redmond Baseman was consulted. On 07/18/21 oral exam he noted, an ulcerative mass of the floor of mouth concerning for cancer and accounting for his obstructive salivary duct and resulting infection.  I performed a biopsy at bedside that included opening the submandibular duct with pus flowing. Biopsy + invasive squamous cell carcinoma. Blood cultures negative. He and sister declined SNF, and discharged home on Augmentin, home health therapies, and out-patient PCP and ENT follow-up.    Per 07/14/21 Intubation records by Godfrey Pick, MD (in setting of airway protection due to facial swelling):  Induction Type: Rapid sequence Ventilation: Mask ventilation without difficulty Laryngoscope Size: Glidescope and 4 Grade View: Grade I Tube size: 7.0 mm Number of attempts: 2 (BVM ventilations in between.  Had to downsize 7.5 mm tube to 7.0 due to swelling posterior to glottis.) Airway Equipment and Method: Rigid stylet and Video-laryngoscopy Difficulty Due To: Difficulty was anticipated   Since hospital discharge he had teeth pulled. He also tested positive for COVID-19 around early August 2023. The above surgery was recommended; however, surgery delayed until October at least partly to his sister needing to recover from her own surgery in order to be able to help  him after his surgery.   Reviewed EKG tracings from 08/26/17-07/18/21 in [facility] with anesthesiologist Oren Bracket, MD. Per posting, he needs ICU bed post-operatively. He had significant  bradycardia with transient T wave abnormality in setting of acute illness/IV sedation while intubated. His follow-up EKG appears at his baseline from 2019. He denied chest pain and SOB per PAT RN phone interview.   Anesthesia team to evaluate on the day of surgery. Last labs noted are > 38 days old.    VS: Ht '5\' 11"'$  (1.803 m)   Wt 81.6 kg   BMI 25.10 kg/m  BP Readings from Last 3 Encounters:  07/23/21 (!) 152/77  05/06/21 (!) 138/55  01/10/21 (!) 143/61   Pulse Readings from Last 3 Encounters:  07/23/21 74  05/06/21 88  01/10/21 98     PROVIDERS: Celene Squibb, MD is PCP  [name], MD is HEM-ONC Michaelle Birks, MD is urologist   LABS: For day of surgery as indicated. As of 07/19/21, labs in [facility] include: Lab Results  Component Value Date   WBC 10.2 07/20/2021   HGB 12.3 (L) 07/20/2021   HCT 37.8 (L) 07/20/2021   PLT 253 07/20/2021   GLUCOSE 103 (H) 07/20/2021   TRIG 76 07/20/2021   ALT 45 (H) 07/20/2021   AST 31 07/20/2021   NA 140 07/20/2021   K 4.1 07/20/2021   CL 106 07/20/2021   CREATININE 0.80 07/20/2021   BUN 13 07/20/2021   CO2 26 07/20/2021   INR 1.0 07/14/2021    IMAGES: 1V PCXR 07/19/21: FINDINGS: There is a enteric tube with tip and side port in the expected location of the proximal duodenum. Stable cardiomediastinal contours. Asymmetric elevation of right hemidiaphragm. No pleural effusion or edema. Mild bibasilar atelectasis. IMPRESSION: 1. Bibasilar atelectasis. 2. Enteric tube tip is in the expected location of the proximal duodenum.   CT Maxillofacial 07/18/21: IMPRESSION: - Decreased left submandibular inflammatory changes. Increased dilatation of the left submandibular duct [name]) again without a stone. An occult lesion at the orifice is not excluded. - Stable indeterminate enlarged right submandibular node. Of potential relevance, there is a markedly decayed presumed right mandibular molar.   EKG:  EKG 07/18/21  00:34:35: Sinus tachycardia at 102 bpm with occasional Premature ventricular complexes Left axis deviation Left ventricular hypertrophy with QRS widening and repolarization abnormality ( R in aVL , Cornell product ) Nonspecific ST abnormality Abnormal ECG When compared with ECG of 15-Jul-2021 09:11, Rate faster Confirmed by Candee Furbish [id number]) on 07/19/2021 4:50:21 PM - He had at least 3 EKGs during his July 2023 admission--initial EKG on 07/14/21 in ED showed WCT at 145 bpm, 07/15/21 EKG in setting of IV sedation while intubated showed marked SB at 36 bpm, wide QRS at 152 ms (LVH with QRS widening versus LBB), marked anterolateral and inferior T wave abnormality. Follow-up EKG on 07/18/21 as above with QRS 118 ms, T wave abnormality now non-specific. HR is faster, but otherwise this EKG appears similar to his 08/26/17 tracing.    CV: N/A   Past Medical History:  Diagnosis Date   Arthritis    BPH (benign prostatic hyperplasia)    Difficult intubation    HOH (hard of hearing)    Hyperlipidemia 11/02/2017   Hypertension    Hypertension 11/02/2017   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[place])    2014   Vitamin D deficiency 11/02/2017    Past Surgical History:  Procedure Laterality Date   COLONOSCOPY N/A 11/24/2012   Procedure: COLONOSCOPY;  Surgeon: Bernadene Person  Gloriann Loan, MD;  Location: [facility] ENDO SUITE;  Service: Endoscopy;  Laterality: N/A;  830-moved to [place] notified pt   HEMORROIDECTOMY     KNEE ARTHROSCOPY WITH LATERAL MENISECTOMY Right 08/31/2017   Procedure: KNEE ARTHROSCOPY WITH LATERAL MENISECTOMY;  Surgeon: Carole Civil, MD;  Location: [facility] ORS;  Service: Orthopedics;  Laterality: Right;   LESION EXCISION N/A 03/23/2012   Procedure: EXCISION NEOPLASM SCALP ;  Surgeon: Jamesetta So, MD;  Location: [facility] ORS;  Service: General;  Laterality: N/A;  Excision of Scalp Neoplasm   ORCHIECTOMY Right 12/20/2012   Procedure: RIGHT RADICAL ORCHIECTOMY/POSSIBLE BX RIGHT TESTICLE;  Surgeon: Marissa Nestle, MD;   Location: [facility] ORS;  Service: Urology;  Laterality: Right;   PROSTATE SURGERY     SCALP LACERATION REPAIR     [facility]-Dr Tamala Julian    MEDICATIONS: No current facility-administered medications for this encounter.    amLODipine-benazepril (LOTREL) 10-40 MG capsule   Cholecalciferol (VITAMIN D3) 50 MCG (2000 UT) TABS   diclofenac Sodium (VOLTAREN) 1 % GEL   gabapentin (NEURONTIN) 300 MG capsule   hydrALAZINE (APRESOLINE) 25 MG tablet   ketotifen (ZADITOR) 0.025 % ophthalmic solution   magnesium oxide (MAG-OX) 400 MG tablet   Menthol, Topical Analgesic, (BIOFREEZE EX)   oxyCODONE (OXY IR/ROXICODONE) 5 MG immediate release tablet   pravastatin (PRAVACHOL) 80 MG tablet   RAPAFLO 8 MG CAPS capsule   vitamin B-12 (CYANOCOBALAMIN) 500 MCG tablet   pantoprazole (PROTONIX) 40 MG tablet    Myra Gianotti, PA-C Surgical Short Stay/Anesthesiology [facility] Phone [phone] [facility] Phone [phone] 10/13/2021 1:18 PM

## 2021-10-13 NOTE — Anesthesia Preprocedure Evaluation (Addendum)
Anesthesia Evaluation  Patient identified by MRN, date of birth, ID band Patient awake    Reviewed: Allergy & Precautions, NPO status , Patient's Chart, lab work & pertinent test results  History of Anesthesia Complications (+) DIFFICULT AIRWAY and history of anesthetic complications  Airway Mallampati: II  TM Distance: >3 FB Neck ROM: Full    Dental  (+) Missing,    Pulmonary former smoker   Pulmonary exam normal        Cardiovascular hypertension, Pt. on medications Normal cardiovascular exam     Neuro/Psych negative neurological ROS  negative psych ROS   GI/Hepatic negative GI ROS, Neg liver ROS,,,  Endo/Other  negative endocrine ROS    Renal/GU negative Renal ROS  negative genitourinary   Musculoskeletal  (+) Arthritis ,    Abdominal   Peds  Hematology negative hematology ROS (+)   Anesthesia Other Findings Floor of mouth ca  Reproductive/Obstetrics negative OB ROS                              Anesthesia Physical Anesthesia Plan  ASA: 3  Anesthesia Plan: General   Post-op Pain Management: Tylenol PO (pre-op)*   Induction: Intravenous  PONV Risk Score and Plan: 2 and Ondansetron, Dexamethasone and Treatment may vary due to age or medical condition  Airway Management Planned: Video Laryngoscope Planned, Tracheostomy and Oral ETT  Additional Equipment: Arterial line  Intra-op Plan:   Post-operative Plan: Post-operative intubation/ventilation  Informed Consent: I have reviewed the patients History and Physical, chart, labs and discussed the procedure including the risks, benefits and alternatives for the proposed anesthesia with the patient or authorized representative who has indicated his/her understanding and acceptance.     Dental advisory given  Plan Discussed with: CRNA  Anesthesia Plan Comments: (See PAT note written 10/13/2021 by Myra Gianotti, PA-C.  Anticipated difficult intubation in setting of facial swelling 07/14/21. Per posting, post-op ICU is planned.    )         Anesthesia Quick Evaluation

## 2021-10-14 ENCOUNTER — Other Ambulatory Visit: Payer: Self-pay

## 2021-10-14 ENCOUNTER — Inpatient Hospital Stay (HOSPITAL_COMMUNITY): Payer: Medicare Other

## 2021-10-14 ENCOUNTER — Inpatient Hospital Stay (HOSPITAL_COMMUNITY): Payer: Medicare Other | Admitting: Vascular Surgery

## 2021-10-14 ENCOUNTER — Encounter (HOSPITAL_COMMUNITY): Payer: Self-pay | Admitting: Otolaryngology

## 2021-10-14 ENCOUNTER — Inpatient Hospital Stay (HOSPITAL_COMMUNITY)
Admission: RE | Admit: 2021-10-14 | Discharge: 2021-10-28 | DRG: 011 | Disposition: A | Discharge status: Rehabilitation Facility | Payer: Medicare Other | Attending: Otolaryngology | Admitting: Otolaryngology

## 2021-10-14 ENCOUNTER — Encounter (HOSPITAL_COMMUNITY)
Admission: RE | Disposition: A | Discharge status: Rehabilitation Facility | Payer: Self-pay | Source: Home / Self Care | Attending: Otolaryngology

## 2021-10-14 DIAGNOSIS — E559 Vitamin D deficiency, unspecified: Secondary | ICD-10-CM | POA: Diagnosis not present

## 2021-10-14 DIAGNOSIS — J690 Pneumonitis due to inhalation of food and vomit: Secondary | ICD-10-CM | POA: Diagnosis not present

## 2021-10-14 DIAGNOSIS — Z930 Tracheostomy status: Secondary | ICD-10-CM | POA: Diagnosis not present

## 2021-10-14 DIAGNOSIS — Z8547 Personal history of malignant neoplasm of testis: Secondary | ICD-10-CM | POA: Diagnosis not present

## 2021-10-14 DIAGNOSIS — I878 Other specified disorders of veins: Secondary | ICD-10-CM | POA: Diagnosis not present

## 2021-10-14 DIAGNOSIS — Z79899 Other long term (current) drug therapy: Secondary | ICD-10-CM

## 2021-10-14 DIAGNOSIS — N400 Enlarged prostate without lower urinary tract symptoms: Secondary | ICD-10-CM | POA: Diagnosis not present

## 2021-10-14 DIAGNOSIS — C069 Malignant neoplasm of mouth, unspecified: Secondary | ICD-10-CM | POA: Diagnosis not present

## 2021-10-14 DIAGNOSIS — D72829 Elevated white blood cell count, unspecified: Secondary | ICD-10-CM | POA: Diagnosis not present

## 2021-10-14 DIAGNOSIS — J90 Pleural effusion, not elsewhere classified: Secondary | ICD-10-CM | POA: Diagnosis not present

## 2021-10-14 DIAGNOSIS — M1990 Unspecified osteoarthritis, unspecified site: Secondary | ICD-10-CM

## 2021-10-14 DIAGNOSIS — I96 Gangrene, not elsewhere classified: Secondary | ICD-10-CM | POA: Diagnosis not present

## 2021-10-14 DIAGNOSIS — R5381 Other malaise: Secondary | ICD-10-CM | POA: Diagnosis not present

## 2021-10-14 DIAGNOSIS — Z9079 Acquired absence of other genital organ(s): Secondary | ICD-10-CM

## 2021-10-14 DIAGNOSIS — R079 Chest pain, unspecified: Secondary | ICD-10-CM | POA: Diagnosis not present

## 2021-10-14 DIAGNOSIS — Z8616 Personal history of COVID-19: Secondary | ICD-10-CM

## 2021-10-14 DIAGNOSIS — R112 Nausea with vomiting, unspecified: Secondary | ICD-10-CM | POA: Diagnosis not present

## 2021-10-14 DIAGNOSIS — R471 Dysarthria and anarthria: Secondary | ICD-10-CM | POA: Diagnosis not present

## 2021-10-14 DIAGNOSIS — H9190 Unspecified hearing loss, unspecified ear: Secondary | ICD-10-CM | POA: Diagnosis not present

## 2021-10-14 DIAGNOSIS — Z4682 Encounter for fitting and adjustment of non-vascular catheter: Secondary | ICD-10-CM | POA: Diagnosis not present

## 2021-10-14 DIAGNOSIS — Z87891 Personal history of nicotine dependence: Secondary | ICD-10-CM

## 2021-10-14 DIAGNOSIS — R6510 Systemic inflammatory response syndrome (SIRS) of non-infectious origin without acute organ dysfunction: Secondary | ICD-10-CM | POA: Diagnosis not present

## 2021-10-14 DIAGNOSIS — C049 Malignant neoplasm of floor of mouth, unspecified: Secondary | ICD-10-CM | POA: Diagnosis not present

## 2021-10-14 DIAGNOSIS — E440 Moderate protein-calorie malnutrition: Secondary | ICD-10-CM | POA: Diagnosis present

## 2021-10-14 DIAGNOSIS — J9811 Atelectasis: Secondary | ICD-10-CM | POA: Diagnosis not present

## 2021-10-14 DIAGNOSIS — R531 Weakness: Secondary | ICD-10-CM | POA: Diagnosis not present

## 2021-10-14 DIAGNOSIS — R1310 Dysphagia, unspecified: Secondary | ICD-10-CM | POA: Diagnosis present

## 2021-10-14 DIAGNOSIS — R509 Fever, unspecified: Secondary | ICD-10-CM | POA: Diagnosis not present

## 2021-10-14 DIAGNOSIS — R1311 Dysphagia, oral phase: Secondary | ICD-10-CM | POA: Diagnosis not present

## 2021-10-14 DIAGNOSIS — I10 Essential (primary) hypertension: Secondary | ICD-10-CM | POA: Diagnosis not present

## 2021-10-14 DIAGNOSIS — C048 Malignant neoplasm of overlapping sites of floor of mouth: Secondary | ICD-10-CM | POA: Diagnosis not present

## 2021-10-14 DIAGNOSIS — E785 Hyperlipidemia, unspecified: Secondary | ICD-10-CM | POA: Diagnosis not present

## 2021-10-14 DIAGNOSIS — Z6824 Body mass index (BMI) 24.0-24.9, adult: Secondary | ICD-10-CM | POA: Diagnosis not present

## 2021-10-14 DIAGNOSIS — Z809 Family history of malignant neoplasm, unspecified: Secondary | ICD-10-CM | POA: Diagnosis not present

## 2021-10-14 DIAGNOSIS — I119 Hypertensive heart disease without heart failure: Secondary | ICD-10-CM | POA: Diagnosis present

## 2021-10-14 DIAGNOSIS — R0602 Shortness of breath: Secondary | ICD-10-CM | POA: Diagnosis not present

## 2021-10-14 DIAGNOSIS — R000 Tachycardia, unspecified: Secondary | ICD-10-CM | POA: Diagnosis not present

## 2021-10-14 DIAGNOSIS — C770 Secondary and unspecified malignant neoplasm of lymph nodes of head, face and neck: Secondary | ICD-10-CM | POA: Diagnosis not present

## 2021-10-14 DIAGNOSIS — Z1152 Encounter for screening for COVID-19: Secondary | ICD-10-CM | POA: Diagnosis not present

## 2021-10-14 DIAGNOSIS — Z85819 Personal history of malignant neoplasm of unspecified site of lip, oral cavity, and pharynx: Secondary | ICD-10-CM | POA: Diagnosis not present

## 2021-10-14 DIAGNOSIS — D62 Acute posthemorrhagic anemia: Secondary | ICD-10-CM | POA: Diagnosis not present

## 2021-10-14 DIAGNOSIS — Z6823 Body mass index (BMI) 23.0-23.9, adult: Secondary | ICD-10-CM | POA: Diagnosis not present

## 2021-10-14 DIAGNOSIS — E8809 Other disorders of plasma-protein metabolism, not elsewhere classified: Secondary | ICD-10-CM | POA: Diagnosis not present

## 2021-10-14 HISTORY — PX: SKIN FULL THICKNESS GRAFT: SHX442

## 2021-10-14 HISTORY — PX: TOOTH EXTRACTION: SHX859

## 2021-10-14 HISTORY — PX: RADICAL NECK DISSECTION: SHX2284

## 2021-10-14 HISTORY — PX: FLOOR OF MOUTH BIOPSY: SHX5834

## 2021-10-14 HISTORY — DX: Failed or difficult intubation, initial encounter: T88.4XXA

## 2021-10-14 HISTORY — PX: TRACHEOSTOMY TUBE PLACEMENT: SHX814

## 2021-10-14 LAB — CBC
HCT: 42.7 % (ref 39.0–52.0)
Hemoglobin: 14 g/dL (ref 13.0–17.0)
MCH: 33.3 pg (ref 26.0–34.0)
MCHC: 32.8 g/dL (ref 30.0–36.0)
MCV: 101.7 fL — ABNORMAL HIGH (ref 80.0–100.0)
Platelets: 243 10*3/uL (ref 150–400)
RBC: 4.2 MIL/uL — ABNORMAL LOW (ref 4.22–5.81)
RDW: 13.9 % (ref 11.5–15.5)
WBC: 4.7 10*3/uL (ref 4.0–10.5)
nRBC: 0 % (ref 0.0–0.2)

## 2021-10-14 LAB — BASIC METABOLIC PANEL
Anion gap: 10 (ref 5–15)
BUN: 10 mg/dL (ref 8–23)
CO2: 26 mmol/L (ref 22–32)
Calcium: 10.5 mg/dL — ABNORMAL HIGH (ref 8.9–10.3)
Chloride: 104 mmol/L (ref 98–111)
Creatinine, Ser: 0.94 mg/dL (ref 0.61–1.24)
GFR, Estimated: >60 mL/min (ref >60)
Glucose, Bld: 103 mg/dL — ABNORMAL HIGH (ref 70–99)
Potassium: 4.1 mmol/L (ref 3.5–5.1)
Sodium: 140 mmol/L (ref 135–145)

## 2021-10-14 LAB — SARS CORONAVIRUS 2 BY RT PCR: SARS Coronavirus 2 by RT PCR: NEGATIVE

## 2021-10-14 LAB — MRSA NEXT GEN BY PCR, NASAL: MRSA by PCR Next Gen: NOT DETECTED

## 2021-10-14 LAB — GLUCOSE, CAPILLARY
Glucose-Capillary: 134 mg/dL — ABNORMAL HIGH (ref 70–99)
Glucose-Capillary: 159 mg/dL — ABNORMAL HIGH (ref 70–99)
Glucose-Capillary: 146 mg/dL — ABNORMAL HIGH (ref 70–99)

## 2021-10-14 SURGERY — BIOPSY, MOUTH, FLOOR
Anesthesia: General | Site: Neck

## 2021-10-14 MED ORDER — SODIUM CHLORIDE 0.9 % IV SOLN
0.1500 ug/kg/min | INTRAVENOUS | Status: DC
  Administered 2021-10-14: .05 ug/kg/min via INTRAVENOUS
  Filled 2021-10-14 (×2): qty 2000

## 2021-10-14 MED ORDER — OXYMETAZOLINE HCL 0.05 % NA SOLN
NASAL | Status: AC
  Filled 2021-10-14: qty 30

## 2021-10-14 MED ORDER — FENTANYL CITRATE (PF) 250 MCG/5ML IJ SOLN
INTRAMUSCULAR | Status: AC
  Filled 2021-10-14: qty 5

## 2021-10-14 MED ORDER — PHENYLEPHRINE HCL-NACL 20-0.9 MG/250ML-% IV SOLN
INTRAVENOUS | Status: DC | PRN
  Administered 2021-10-14: 15 ug/min via INTRAVENOUS

## 2021-10-14 MED ORDER — MORPHINE SULFATE (PF) 2 MG/ML IV SOLN
2.0000 mg | INTRAVENOUS | Status: DC | PRN
  Administered 2021-10-14: 4 mg via INTRAVENOUS
  Administered 2021-10-14 – 2021-10-16 (×4): 2 mg via INTRAVENOUS
  Administered 2021-10-17: 4 mg via INTRAVENOUS
  Administered 2021-10-17 (×3): 2 mg via INTRAVENOUS
  Administered 2021-10-17: 4 mg via INTRAVENOUS
  Filled 2021-10-14 (×4): qty 1
  Filled 2021-10-14: qty 2
  Filled 2021-10-14 (×2): qty 1
  Filled 2021-10-14: qty 2
  Filled 2021-10-14: qty 1
  Filled 2021-10-14 (×2): qty 2

## 2021-10-14 MED ORDER — LIDOCAINE-EPINEPHRINE 1 %-1:100000 IJ SOLN
INTRAMUSCULAR | Status: DC | PRN
  Administered 2021-10-14: 17 mL

## 2021-10-14 MED ORDER — ACETAMINOPHEN 10 MG/ML IV SOLN
INTRAVENOUS | Status: AC
  Filled 2021-10-14: qty 100

## 2021-10-14 MED ORDER — LABETALOL HCL 5 MG/ML IV SOLN
10.0000 mg | INTRAVENOUS | Status: DC | PRN
  Administered 2021-10-14 (×2): 10 mg via INTRAVENOUS
  Filled 2021-10-14 (×2): qty 4

## 2021-10-14 MED ORDER — PROPOFOL 10 MG/ML IV BOLUS
INTRAVENOUS | Status: AC
  Filled 2021-10-14: qty 20

## 2021-10-14 MED ORDER — EPINEPHRINE HCL (NASAL) 0.1 % NA SOLN
NASAL | Status: AC
  Filled 2021-10-14: qty 30

## 2021-10-14 MED ORDER — PROPOFOL 10 MG/ML IV BOLUS
INTRAVENOUS | Status: DC | PRN
  Administered 2021-10-14: 140 mg via INTRAVENOUS
  Administered 2021-10-14: 60 mg via INTRAVENOUS

## 2021-10-14 MED ORDER — ORAL CARE MOUTH RINSE: 15.0000 mL | Freq: Once | OROMUCOSAL | Status: AC

## 2021-10-14 MED ORDER — CEFAZOLIN SODIUM 1 G IJ SOLR
INTRAMUSCULAR | Status: AC
  Filled 2021-10-14: qty 20

## 2021-10-14 MED ORDER — BACITRACIN ZINC 500 UNIT/GM EX OINT
TOPICAL_OINTMENT | CUTANEOUS | Status: AC
  Filled 2021-10-14: qty 28.35

## 2021-10-14 MED ORDER — PHENYLEPHRINE 80 MCG/ML (10ML) SYRINGE FOR IV PUSH (FOR BLOOD PRESSURE SUPPORT)
PREFILLED_SYRINGE | INTRAVENOUS | Status: DC | PRN
  Administered 2021-10-14: 80 ug via INTRAVENOUS
  Administered 2021-10-14 (×4): 160 ug via INTRAVENOUS

## 2021-10-14 MED ORDER — 0.9 % SODIUM CHLORIDE (POUR BTL) OPTIME
TOPICAL | Status: DC | PRN
  Administered 2021-10-14: 1000 mL

## 2021-10-14 MED ORDER — CEFAZOLIN SODIUM-DEXTROSE 2-4 GM/100ML-% IV SOLN
2.0000 g | INTRAVENOUS | Status: AC
  Administered 2021-10-14 (×2): 2 g via INTRAVENOUS
  Filled 2021-10-14: qty 100

## 2021-10-14 MED ORDER — CHLORHEXIDINE GLUCONATE 0.12 % MT SOLN
15.0000 mL | Freq: Once | OROMUCOSAL | Status: AC
  Administered 2021-10-14: 15 mL via OROMUCOSAL
  Filled 2021-10-14: qty 15

## 2021-10-14 MED ORDER — BACITRACIN ZINC 500 UNIT/GM EX OINT
TOPICAL_OINTMENT | CUTANEOUS | Status: DC | PRN
  Administered 2021-10-14: 1 via TOPICAL

## 2021-10-14 MED ORDER — ORAL CARE MOUTH RINSE: 15.0000 mL | OROMUCOSAL | Status: DC | PRN

## 2021-10-14 MED ORDER — ONDANSETRON HCL 4 MG/2ML IJ SOLN
4.0000 mg | Freq: Four times a day (QID) | INTRAMUSCULAR | Status: DC | PRN
  Administered 2021-10-14 – 2021-10-21 (×8): 4 mg via INTRAVENOUS
  Filled 2021-10-14 (×8): qty 2

## 2021-10-14 MED ORDER — SUGAMMADEX SODIUM 200 MG/2ML IV SOLN
INTRAVENOUS | Status: DC | PRN
  Administered 2021-10-14: 167.8 mg via INTRAVENOUS

## 2021-10-14 MED ORDER — EPHEDRINE 5 MG/ML INJ
INTRAVENOUS | Status: AC
  Filled 2021-10-14: qty 5

## 2021-10-14 MED ORDER — EPINEPHRINE PF 1 MG/ML IJ SOLN
INTRAMUSCULAR | Status: AC
  Filled 2021-10-14: qty 1

## 2021-10-14 MED ORDER — LACTATED RINGERS IV SOLN: INTRAVENOUS | Status: DC

## 2021-10-14 MED ORDER — FENTANYL CITRATE PF 50 MCG/ML IJ SOSY
PREFILLED_SYRINGE | INTRAMUSCULAR | Status: AC
  Filled 2021-10-14: qty 2

## 2021-10-14 MED ORDER — ACETAMINOPHEN 10 MG/ML IV SOLN
INTRAVENOUS | Status: DC | PRN
  Administered 2021-10-14: 1000 mg via INTRAVENOUS

## 2021-10-14 MED ORDER — ROCURONIUM BROMIDE 10 MG/ML (PF) SYRINGE
PREFILLED_SYRINGE | INTRAVENOUS | Status: AC
  Filled 2021-10-14: qty 10

## 2021-10-14 MED ORDER — DEXAMETHASONE SODIUM PHOSPHATE 10 MG/ML IJ SOLN
INTRAMUSCULAR | Status: DC | PRN
  Administered 2021-10-14: 10 mg via INTRAVENOUS

## 2021-10-14 MED ORDER — HYDROMORPHONE HCL 1 MG/ML IJ SOLN
INTRAMUSCULAR | Status: AC
  Filled 2021-10-14: qty 0.5

## 2021-10-14 MED ORDER — LIDOCAINE 2% (20 MG/ML) 5 ML SYRINGE
INTRAMUSCULAR | Status: DC | PRN
  Administered 2021-10-14: 100 mg via INTRAVENOUS

## 2021-10-14 MED ORDER — EPHEDRINE SULFATE-NACL 50-0.9 MG/10ML-% IV SOSY
PREFILLED_SYRINGE | INTRAVENOUS | Status: DC | PRN
  Administered 2021-10-14: 5 mg via INTRAVENOUS

## 2021-10-14 MED ORDER — HYDROCODONE-ACETAMINOPHEN 7.5-325 MG/15ML PO SOLN
10.0000 mL | ORAL | Status: DC | PRN
  Administered 2021-10-14 – 2021-10-20 (×3): 15 mL
  Filled 2021-10-14 (×4): qty 15

## 2021-10-14 MED ORDER — BACITRACIN ZINC 500 UNIT/GM EX OINT
1.0000 | TOPICAL_OINTMENT | Freq: Three times a day (TID) | CUTANEOUS | Status: DC
  Administered 2021-10-14 – 2021-10-28 (×43): 1 via TOPICAL
  Filled 2021-10-14: qty 28.4
  Filled 2021-10-14: qty 28.35

## 2021-10-14 MED ORDER — SODIUM CHLORIDE 0.9 % IV SOLN: INTRAVENOUS | Status: DC | PRN

## 2021-10-14 MED ORDER — ARTIFICIAL TEARS OPHTHALMIC OINT
TOPICAL_OINTMENT | OPHTHALMIC | Status: AC
  Filled 2021-10-14: qty 3.5

## 2021-10-14 MED ORDER — EPINEPHRINE 1 MG/10ML IJ SOSY
PREFILLED_SYRINGE | INTRAMUSCULAR | Status: AC
  Filled 2021-10-14: qty 20

## 2021-10-14 MED ORDER — ACETAMINOPHEN 10 MG/ML IV SOLN: 1000.0000 mg | Freq: Once | INTRAVENOUS | Status: DC | PRN

## 2021-10-14 MED ORDER — FENTANYL CITRATE (PF) 250 MCG/5ML IJ SOLN
INTRAMUSCULAR | Status: DC | PRN
  Administered 2021-10-14 (×3): 50 ug via INTRAVENOUS
  Administered 2021-10-14: 100 ug via INTRAVENOUS
  Administered 2021-10-14: 25 ug via INTRAVENOUS
  Administered 2021-10-14: 50 ug via INTRAVENOUS
  Administered 2021-10-14: 25 ug via INTRAVENOUS

## 2021-10-14 MED ORDER — ORAL CARE MOUTH RINSE
15.0000 mL | OROMUCOSAL | Status: DC
  Administered 2021-10-14 – 2021-10-23 (×34): 15 mL via OROMUCOSAL

## 2021-10-14 MED ORDER — LIDOCAINE-EPINEPHRINE 1 %-1:100000 IJ SOLN
INTRAMUSCULAR | Status: AC
  Filled 2021-10-14: qty 1

## 2021-10-14 MED ORDER — ROCURONIUM BROMIDE 10 MG/ML (PF) SYRINGE
PREFILLED_SYRINGE | INTRAVENOUS | Status: DC | PRN
  Administered 2021-10-14: 60 mg via INTRAVENOUS
  Administered 2021-10-14: 40 mg via INTRAVENOUS

## 2021-10-14 MED ORDER — LACTATED RINGERS IV SOLN: INTRAVENOUS | Status: DC | PRN

## 2021-10-14 MED ORDER — CHLORHEXIDINE GLUCONATE CLOTH 2 % EX PADS
6.0000 | MEDICATED_PAD | Freq: Every day | CUTANEOUS | Status: DC
  Administered 2021-10-14 – 2021-10-28 (×14): 6 via TOPICAL

## 2021-10-14 MED ORDER — DEXAMETHASONE SODIUM PHOSPHATE 10 MG/ML IJ SOLN
INTRAMUSCULAR | Status: AC
  Filled 2021-10-14: qty 1

## 2021-10-14 MED ORDER — ONDANSETRON HCL 4 MG/2ML IJ SOLN
INTRAMUSCULAR | Status: DC | PRN
  Administered 2021-10-14: 4 mg via INTRAVENOUS

## 2021-10-14 MED ORDER — CEFAZOLIN SODIUM-DEXTROSE 1-4 GM/50ML-% IV SOLN
1.0000 g | Freq: Three times a day (TID) | INTRAVENOUS | Status: AC
  Administered 2021-10-14 – 2021-10-15 (×3): 1 g via INTRAVENOUS
  Filled 2021-10-14 (×3): qty 50

## 2021-10-14 MED ORDER — FENTANYL CITRATE (PF) 100 MCG/2ML IJ SOLN: 25.0000 ug | INTRAMUSCULAR | Status: DC | PRN

## 2021-10-14 MED ORDER — ONDANSETRON HCL 4 MG/2ML IJ SOLN
INTRAMUSCULAR | Status: AC
  Filled 2021-10-14: qty 2

## 2021-10-14 MED ORDER — ARTIFICIAL TEARS OPHTHALMIC OINT
TOPICAL_OINTMENT | OPHTHALMIC | Status: DC | PRN
  Administered 2021-10-14: 1 via OPHTHALMIC

## 2021-10-14 MED ORDER — PHENYLEPHRINE 80 MCG/ML (10ML) SYRINGE FOR IV PUSH (FOR BLOOD PRESSURE SUPPORT)
PREFILLED_SYRINGE | INTRAVENOUS | Status: AC
  Filled 2021-10-14: qty 10

## 2021-10-14 MED ORDER — HYDROMORPHONE HCL 1 MG/ML IJ SOLN
INTRAMUSCULAR | Status: DC | PRN
  Administered 2021-10-14 (×2): .5 mg via INTRAVENOUS

## 2021-10-14 MED ORDER — KCL IN DEXTROSE-NACL 20-5-0.45 MEQ/L-%-% IV SOLN
INTRAVENOUS | Status: DC
  Filled 2021-10-14 (×5): qty 1000

## 2021-10-14 MED ORDER — LIDOCAINE 2% (20 MG/ML) 5 ML SYRINGE
INTRAMUSCULAR | Status: AC
  Filled 2021-10-14: qty 5

## 2021-10-14 SURGICAL SUPPLY — 73 items
ATTRACTOMAT 16X20 MAGNETIC DRP (DRAPES) IMPLANT
BAG COUNTER SPONGE SURGICOUNT (BAG) ×5 IMPLANT
BAG SPNG CNTER NS LX DISP (BAG) ×5
BLADE SURG 15 STRL LF DISP TIS (BLADE) IMPLANT
BLADE SURG 15 STRL SS (BLADE) ×5
CANISTER SUCT 3000ML PPV (MISCELLANEOUS) ×5 IMPLANT
CLEANER TIP ELECTROSURG 2X2 (MISCELLANEOUS) ×5 IMPLANT
CNTNR URN SCR LID CUP LEK RST (MISCELLANEOUS) IMPLANT
CONT SPEC 4OZ STRL OR WHT (MISCELLANEOUS) ×70
CORD BIPOLAR FORCEPS 12FT (ELECTRODE) ×5 IMPLANT
COVER BACK TABLE 60X90IN (DRAPES) ×5 IMPLANT
COVER MAYO STAND STRL (DRAPES) ×5 IMPLANT
COVER SURGICAL LIGHT HANDLE (MISCELLANEOUS) ×5 IMPLANT
DRAIN JP 10F RND RADIO (DRAIN) IMPLANT
DRAPE HALF SHEET 40X57 (DRAPES) ×5 IMPLANT
DRAPE ORTHO SPLIT 77X108 STRL (DRAPES) ×5
DRAPE SURG ORHT 6 SPLT 77X108 (DRAPES) IMPLANT
DRSG DRAWTEX TRACH 4X4 (GAUZE/BANDAGES/DRESSINGS) IMPLANT
DRSG TELFA 3X8 NADH STRL (GAUZE/BANDAGES/DRESSINGS) IMPLANT
ELECT COATED BLADE 2.86 ST (ELECTRODE) ×5 IMPLANT
ELECT NDL BLADE 2-5/6 (NEEDLE) IMPLANT
ELECT NEEDLE BLADE 2-5/6 (NEEDLE) ×5 IMPLANT
ELECT REM PT RETURN 9FT ADLT (ELECTROSURGICAL) ×5
ELECTRODE REM PT RTRN 9FT ADLT (ELECTROSURGICAL) ×5 IMPLANT
EVACUATOR SILICONE 100CC (DRAIN) ×5 IMPLANT
FORCEPS BIPOLAR SPETZLER 8 1.0 (NEUROSURGERY SUPPLIES) ×5 IMPLANT
GAUZE 4X4 16PLY ~~LOC~~+RFID DBL (SPONGE) ×5 IMPLANT
GLOVE BIO SURGEON STRL SZ7.5 (GLOVE) ×5 IMPLANT
GOWN STRL REUS W/ TWL LRG LVL3 (GOWN DISPOSABLE) ×10 IMPLANT
GOWN STRL REUS W/TWL LRG LVL3 (GOWN DISPOSABLE) ×10
GUARD TEETH (MISCELLANEOUS) ×5 IMPLANT
HOLDER TRACH TUBE TIE MED (TUBING) IMPLANT
HOLDER TRACH TUBE VELCRO 19.5 (MISCELLANEOUS) ×5 IMPLANT
KIT BASIN OR (CUSTOM PROCEDURE TRAY) ×5 IMPLANT
KIT TURNOVER KIT B (KITS) ×5 IMPLANT
NDL HYPO 25GX1X1/2 BEV (NEEDLE) ×5 IMPLANT
NEEDLE HYPO 25GX1X1/2 BEV (NEEDLE) ×5 IMPLANT
NS IRRIG 1000ML POUR BTL (IV SOLUTION) ×5 IMPLANT
PAD ARMBOARD 7.5X6 YLW CONV (MISCELLANEOUS) ×10 IMPLANT
PENCIL BUTTON HOLSTER BLD 10FT (ELECTRODE) ×5 IMPLANT
PENCIL SMOKE EVACUATOR (MISCELLANEOUS) ×5 IMPLANT
POSITIONER HEAD DONUT 9IN (MISCELLANEOUS) ×5 IMPLANT
SPECIMEN JAR MEDIUM (MISCELLANEOUS) ×5 IMPLANT
SPIKE FLUID TRANSFER (MISCELLANEOUS) ×5 IMPLANT
SPONGE DRAIN TRACH 4X4 STRL 2S (GAUZE/BANDAGES/DRESSINGS) ×5 IMPLANT
SPONGE T-LAP 18X18 ~~LOC~~+RFID (SPONGE) ×5 IMPLANT
STAPLER VISISTAT 35W (STAPLE) ×5 IMPLANT
SUT ETHILON 2 0 FS 18 (SUTURE) IMPLANT
SUT ETHILON 3 0 PS 1 (SUTURE) ×5 IMPLANT
SUT SILK 0 FSL (SUTURE) ×5 IMPLANT
SUT SILK 2 0 REEL (SUTURE) ×5 IMPLANT
SUT SILK 2 0 SH CR/8 (SUTURE) ×5 IMPLANT
SUT SILK 3 0 REEL (SUTURE) ×5 IMPLANT
SUT SILK 4 0 REEL (SUTURE) ×5 IMPLANT
SUT VIC AB 2-0 SH 18 (SUTURE) IMPLANT
SUT VIC AB 3-0 PS2 18 (SUTURE) IMPLANT
SUT VIC AB 3-0 SH 27 (SUTURE) ×5
SUT VIC AB 3-0 SH 27X BRD (SUTURE) ×5 IMPLANT
SUT VICRYL 4-0 PS2 18IN ABS (SUTURE) ×10 IMPLANT
SYR 10ML LL (SYRINGE) IMPLANT
SYR 20ML ECCENTRIC (SYRINGE) ×5 IMPLANT
SYR BULB EAR ULCER 3OZ GRN STR (SYRINGE) ×5 IMPLANT
SYR CONTROL 10ML LL (SYRINGE) ×5 IMPLANT
TOWEL GREEN STERILE FF (TOWEL DISPOSABLE) ×10 IMPLANT
TRAY ENT MC OR (CUSTOM PROCEDURE TRAY) ×5 IMPLANT
TRAY FOLEY MTR SLVR 14FR STAT (SET/KITS/TRAYS/PACK) IMPLANT
TUBE CONNECTING 12X1/4 (SUCTIONS) ×5 IMPLANT
TUBE CONNECTING 20X1/4 (TUBING) IMPLANT
TUBE FEEDING 10FR FLEXIFLO (MISCELLANEOUS) IMPLANT
TUBE TRACH FLEX 8.0 CUFF (MISCELLANEOUS) ×5
TUBE TRACH FLEX 8.5 CUFF (MISCELLANEOUS) IMPLANT
UNDERPAD 30X36 HEAVY ABSORB (UNDERPADS AND DIAPERS) ×5 IMPLANT
WATER STERILE IRR 1000ML POUR (IV SOLUTION) ×5 IMPLANT

## 2021-10-14 NOTE — OR Nursing (Signed)
1341 - Call from Dr. Claudette Laws regarding new margins

## 2021-10-14 NOTE — Progress Notes (Signed)
   ENT Progress Note: s/p Procedure(s): FLOOR OF MOUTH RESECTION NECK DISSECTION PLATYSMA FLAP CLOSURE TRACHEOSTOMY DENTAL EXTRACTIONS   Subjective: Post op pain  Objective: Vital signs in last 24 hours: Temp:  [97 F (36.1 C)-98.2 F (36.8 C)] 98.2 F (36.8 C) (10/03 1531) Pulse Rate:  [66-113] 95 (10/03 1545) Resp:  [14-24] 24 (10/03 1545) BP: (153-166)/(60-70) 163/60 (10/03 1445) SpO2:  [94 %-99 %] 95 % (10/03 1545) Arterial Line BP: (184-215)/(55-68) 201/66 (10/03 1545) FiO2 (%):  [28 %] 28 % (10/03 1530) Weight:  [81.7 kg-83.9 kg] 81.7 kg (10/03 1531) Weight change:     Intake/Output from previous day: No intake/output data recorded. Intake/Output this shift: Total I/O In: 3130 [I.V.:2800; Other:30; IV YIRSWNIOE:703] Out: 730 [Urine:600; Drains:30; Blood:100]  Labs: Recent Labs    10/14/21 0602  WBC 4.7  HGB 14.0  HCT 42.7  PLT 243   Recent Labs    10/14/21 0602  NA 140  K 4.1  CL 104  CO2 26  GLUCOSE 103*  BUN 10  CALCIUM 10.5*    Studies/Results: DG Abd Portable 1V  Result Date: 10/14/2021 CLINICAL DATA:  Nasogastric tube placement. EXAM: PORTABLE ABDOMEN - 1 VIEW COMPARISON:  Same day. FINDINGS: Distal tip of feeding tube is seen in expected position of the stomach. IMPRESSION: Distal tip of feeding tube seen in expected position of the stomach. Electronically Signed   By: Marijo Conception M.D.   On: 10/14/2021 16:18   DG Abd Portable 1V  Result Date: 10/14/2021 CLINICAL DATA:  Feeding tube placement EXAM: PORTABLE ABDOMEN - 1 VIEW COMPARISON:  07/19/2021 FINDINGS: Soft feeding tube tip is just at the gastroesophageal junction. Advance further to be completely within the stomach. IMPRESSION: Soft feeding tube tip just at the gastroesophageal junction. Advance further to be completely within the stomach. Electronically Signed   By: Nelson Chimes M.D.   On: 10/14/2021 15:26     PHYSICAL EXAM: Trach in place -  airway stable Incision intact no  swelling, JP's functional Mild ant OC bleeding    Assessment/Plan: Stable Postop in ICU Monitor airway O/N BP elevated - cont pain management     Nathaniel Hicks 10/14/2021, 5:38 PM

## 2021-10-14 NOTE — OR Nursing (Signed)
1006 - Call Dr. Claudette Laws regarding 3 of the frozen specimens that were sent down.

## 2021-10-14 NOTE — Progress Notes (Signed)
No call back from previous number attempted a call to [phone]  , which id office number and left a voicemail for doctor on call, which was stated to be Dr . Wilburn Cornelia. RN awaiting call to update about left JP drain not charging and elevated BP unresolved by PRN pain management and anti-hypertensive meds

## 2021-10-14 NOTE — Anesthesia Procedure Notes (Signed)
Arterial Line Insertion Start/End10/03/2021 7:57 AM, 10/14/2021 8:02 AM Performed by: Brennan Bailey, MD, anesthesiologist  Patient location: Pre-op. Preanesthetic checklist: patient identified, IV checked, site marked, risks and benefits discussed, surgical consent, monitors and equipment checked, pre-op evaluation, timeout performed and anesthesia consent Patient sedated Left, radial was placed Catheter size: 20 G Hand hygiene performed  and maximum sterile barriers used   Attempts: 4 Procedure performed using ultrasound guided technique. Following insertion, dressing applied and Biopatch. Post procedure assessment: normal and unchanged  Post procedure complications: second provider assisted and unsuccessful attempts. Patient tolerated the procedure well with no immediate complications. Additional procedure comments: Unsuccessful attempt(s) by CRNA in preop. Line successfully placed in [place] after induction with ultrasound guidance by myself. Daiva Huge, MD.

## 2021-10-14 NOTE — Progress Notes (Signed)
A line and cuff pressure with 20-72mg range difference with the SBP, however MAP pressure are correlating. Orders received to pull A line and monitor cuff  pressures.

## 2021-10-14 NOTE — Anesthesia Procedure Notes (Signed)
Procedure Name: Intubation Date/Time: 10/14/2021 7:50 AM  Performed by: Maude Leriche, CRNAPre-anesthesia Checklist: Patient identified, Emergency Drugs available, Suction available and Patient being monitored Patient Re-evaluated:Patient Re-evaluated prior to induction Oxygen Delivery Method: Circle system utilized Preoxygenation: Pre-oxygenation with 100% oxygen Induction Type: IV induction Ventilation: Mask ventilation without difficulty Laryngoscope Size: Glidescope and 3 Grade View: Grade I Tube type: Oral Tube size: 7.5 mm Number of attempts: 1 Airway Equipment and Method: Stylet Placement Confirmation: ETT inserted through vocal cords under direct vision, positive ETCO2 and breath sounds checked- equal and bilateral Secured at: 22 cm Tube secured with: Tape Dental Injury: Teeth and Oropharynx as per pre-operative assessment

## 2021-10-14 NOTE — Op Note (Signed)
Preop diagnosis: Floor of mouth cancer Postop diagnosis: same Procedure: Floor of mouth resection, 5 x 5 cm, Tracheostomy, Platysma flap closure, dental extractions #22-27, bilateral selective neck dissections zones 1-3 Surgeon: Redmond Baseman Assist: RNFA Anesth: General and local with 1% lidocaine with 1:100,000 epinephrine Compl: None Findings: Granular mass of floor of mouth straddling midline from near gingival margin to ventral tongue.  Anterior and left gingival margins were positive so teeth 22-27 were removed and additional gingiva provided that was found to be negative.  Left neck contents not grossly abnormal except for a firm submandibular gland and post-obstructed salivary fluid.  Right neck contents with a firm mass anterior to submandibular gland, gland was firm, and otherwise unremarkable. Description:  After discussing risks, benefits, and alternatives, the patient was brought to the operative suite and placed on the operative table in the supine position.  Anesthesia was induced and the patient was intubated by the anesthesia team without difficulty.  The bed was turned 180 degrees from Anesthesia.  A foley catheter was placed as was an arterial line.  The neck was prepped and draped in sterile fashion.  The tracheostomy incision and neck dissection incision was marked with a marking pen an injected with local anesthetic.  The incision was made using a 15 blade scalpel.  Subcutaneous fat was removed.  The strap muscles were retracted laterally exposing the thyroid isthmus.  The isthmus was divided using electocautery and ligated.  Retraction of the isthmus to either side allowed exposure of the anterior tracheal wall.  Soft tissues were swept from the trachea.  The space between the second and third tracheal rings was injected with local anesthetic.  A horizontal incision was made in this space with the scalpel and extended to either side with scissors.  A 2-0 silk stay suture was placed around the  ring above and below the trach site.  A #8 cuffed flexible Shiley trach was placed and the inner cannula was inserted.  The anesthesia circuit was attached and the patient was successfully ventilated.  The stay sutures were tied with two knots above and one knot below the trach site.  The trach flange was secured to the neck skin using 2-0 silk in four quadrants.  The oral cavity was then inspected.  A mouth gag was added.  The area around the floor of mouth mass was injected with local anesthetic.  An incision was made around the mass with a 1 cm margin using electrocautery, including onto the lingual gingiva.  Dissection was extended deeper around the mass.  Anteriorly, the soft tissues were elevated from the underlying mandible with an elevator.  Continued dissection around the periphery of the mass was then extended deep to the mass until the entirety of the mass was removed.  This was marked with an anterior suture and passed to nursing for pathology.  Margins were then collected from the right, central, and left gingival margins, the left, central, and right ventral tongue, and the deep muscle.  These were each sent for frozen section examination.  Meanwhile, the defect was measured in the floor of mouth to be about 5 x 5 cm.  The right neck incision marking was modified to include a 5 x 5 cm skin paddle.  The incision was made with a 15 blade scalpel including around the skin paddle.  The platysma muscle was incised distal to the skin paddle but kept intact proximal to the paddle.  A subcutaneous flap was then elevated to the mandible margin  superior to the skin paddle.  A sub-platysmal flap was then elevated starting distal to the skin paddle and extending up toward the mandible margin.  Effort was made to identify and preserve the marginal mandibular branch.  The flap was kept elevated superiorly while the neck dissection was performed.  Dissection was performed around the superior edge of the  submandibular gland allowing it to be retracted inferiorly along with the zone 1 contents.  The facial artery was divided and ligated.  Further dissection allowed exposure of the floor of mouth musculature that was swept anteriorly.  Vascular and neural connections were divided and ligated, keeping the lingual nerve intact.  However, the distal lingual nerve was part of the floor of mouth resection.  The submandibular tissues, along with an adjacent mass, were then further dissected and moved inferiorly.  The sternocleidomastoid muscle was then skeletonized down to the omoyhyoid muscle.  The soft tissues deep to the muscle were then swept anteriorly to the internal jugular vein which was then skeletonized.  Superiorly, the spinal accessory nerve was dissected and soft tissues inferior to the nerve freed.  Zone 2b was left alone.  Further dissection continued allowing elevation of the soft tissues from the great vessels and contents were then removed and passed to nursing for pathology.  At this point, margins were reported with involvement of the anterior and left gingival margins.  As a result, the decision was made to remove the anterior teeth to allow further clearing of the margin.  Thus, forceps were used to remove teeth 22-27.  The gingival around the site was elevated.  Remaining bony edges were reduced with a bone rongeur.  The gingiva from the area was then removed using a scalpel and sent for new margins from anterior and left side.  The left neck dissection was then performed.  The incision was made with a 15 blade scalpel and extended through the subcutaneous and platysma layers with Bovie electrocautery.  The external jugular vein was divided and ligated.  Sub-platysmal flaps were elevated superiorly and inferiorly and flaps were sutured back.  The region of the marginal mandibular nerve was swept superiorly.  The submandibular gland was then dissected from superior connections and reflected  inferiorly.  The facial artery was divided and ligated.  The floor of mouth muscles were swept anteriorly allowing exposure of the lingual nerve.  Vascular and neural connections were divided and ligated.  The gland was further dissected and removed.  The sternocleidomastoid muscle was skeletonized and retracted posteriorly.  The spinal accessory nerve was dissected and kept intact superiorly.  Soft tissues were swept from posterior to the jugular vein to the vein.  The digastric muscle and spinal accessory nerve marked the superior extent of the dissection.  The omohyoid muscles marked the inferior extent.  Soft tissues were then elevated off of the internal jugular vein.  Tissues were further dissected anteriorly until zones 2A and 3 were removed together and passed to nursing for pathology and included with the submandibular gland.  At this point, with the new margins reported clear, the platysma flap was rotated into the floor of mouth from the right side after freeing it further.  The periphery of the flap was sutured into place using 3-0 Vicryl in a simple, interrupted fashion.  This included suturing over the inferior alveolus anteriorly.  A Panda feeding tube was placed through the right nasal passage and secured to the nose using tape.  The neck sites were then irrigated copiously  with saline.  A 10 French suction drain was placed in the depth of the wound on either side and secured at the skin with 3-0 Nylon with a standard drain stitch.  The flaps were released and the platysma and subcutaneous layers were closed with 3-0 Vicryl in a simple, interrupted fashion.  The skin was closed with staples.  The drains were placed to bulb suction.  Bacitracin ointment was added to the incision.  Drapes were removed and the drain was secured to the shoulder with tape.  He was then returned to anesthesia for wake-up and was extubated and moved to the recovery room in stable condition.

## 2021-10-14 NOTE — Transfer of Care (Signed)
Immediate Anesthesia Transfer of Care Note  Patient: Nathaniel Hicks  Procedure(s) Performed: FLOOR OF MOUTH RESECTION (Mouth) NECK DISSECTION (Bilateral: Neck) PLATYSMA FLAP CLOSURE (Bilateral: Face) TRACHEOSTOMY (Neck) DENTAL EXTRACTIONS (Mouth)  Patient Location: PACU  Anesthesia Type:General  Level of Consciousness: awake, alert , and oriented  Airway & Oxygen Therapy: Patient Spontanous Breathing and Patient connected to tracheostomy mask oxygen  Post-op Assessment: Report given to RN, Patient moving all extremities X 4, and Patient able to stick tongue midline  Post vital signs: Reviewed  Last Vitals:  Vitals Value Taken Time  BP 177/68   Temp 36.4   Pulse 116 10/14/21 1426  Resp 16 10/14/21 1426  SpO2 97 % 10/14/21 1426  Vitals shown include unvalidated device data.  Last Pain:  Vitals:   10/14/21 0608  TempSrc:   PainSc: 0-No pain      Patients Stated Pain Goal: 3 (63/84/53 6468)  Complications: No notable events documented.

## 2021-10-14 NOTE — Progress Notes (Signed)
Left JP drain is not staying charge, charger it at [facility] and it was uncharged at 2004. Left message to call RN concerning this matter at number    [phone]

## 2021-10-14 NOTE — Brief Op Note (Signed)
10/14/2021  2:18 PM  PATIENT:  Nathaniel Hicks  81 y.o. male  PRE-OPERATIVE DIAGNOSIS:  Floor of mouth cancer  POST-OPERATIVE DIAGNOSIS:  Floor of mouth cancer  PROCEDURE:  Procedure(s): FLOOR OF MOUTH RESECTION (N/A) NECK DISSECTION (Bilateral) PLATYSMA FLAP CLOSURE (Bilateral) TRACHEOSTOMY (N/A) DENTAL EXTRACTIONS  SURGEON:  Surgeon(s) and Role:    Redmond Baseman, Orpah Greek, MD - Primary  PHYSICIAN ASSISTANT:   ASSISTANTS: RNFA   ANESTHESIA:   general  EBL:  100 mL   BLOOD ADMINISTERED:none  DRAINS: (10 Fr) Jackson-Pratt drain(s) with closed bulb suction in the each side of neck    LOCAL MEDICATIONS USED:  LIDOCAINE   SPECIMEN:  Source of Specimen:  Floor of mouth, right and left sublingual glands, right and left neck contents, margins, teeth  DISPOSITION OF SPECIMEN:  PATHOLOGY  COUNTS:  YES  TOURNIQUET:  * No tourniquets in log *  DICTATION: .Note written in EPIC  PLAN OF CARE: Admit to inpatient   PATIENT DISPOSITION:  ICU - intubated and hemodynamically stable.   Delay start of Pharmacological VTE agent (>24hrs) due to surgical blood loss or risk of bleeding: yes

## 2021-10-14 NOTE — H&P (Signed)
Nathaniel Hicks is an 81 y.o. male.   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  He presents for surgical management.  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   Vitamin D deficiency 11/02/2017    Past Surgical History:  Procedure Laterality Date   COLONOSCOPY N/A 11/24/2012   Procedure: COLONOSCOPY;  Surgeon: Rogene Houston, MD;  Location: [facility];  Service: Endoscopy;  Laterality: N/A;  830-moved to [place] notified pt   HEMORROIDECTOMY     KNEE ARTHROSCOPY WITH LATERAL MENISECTOMY Right 08/31/2017   Procedure: KNEE ARTHROSCOPY WITH LATERAL MENISECTOMY;  Surgeon: Carole Civil, MD;  Location: [facility] ORS;  Service: Orthopedics;  Laterality: Right;   LESION EXCISION N/A 03/23/2012   Procedure: EXCISION NEOPLASM SCALP ;  Surgeon: Jamesetta So, MD;  Location: [facility] ORS;  Service: General;  Laterality: N/A;  Excision of Scalp Neoplasm   ORCHIECTOMY Right 12/20/2012   Procedure: RIGHT RADICAL ORCHIECTOMY/POSSIBLE BX RIGHT TESTICLE;  Surgeon: Marissa Nestle, MD;  Location: [facility] ORS;  Service: Urology;  Laterality: Right;   PROSTATE SURGERY     SCALP LACERATION REPAIR     [facility]-Dr Tamala Julian    Family History  Problem Relation Age of Onset   Cancer Brother    Cancer Brother    Social History:  reports that he has quit smoking. His smoking use included cigarettes. He has a 12.50 pack-year smoking history. He has never used smokeless tobacco. He reports that he does not currently use alcohol. He reports that he does not use drugs.  Allergies: No Known Allergies  Medications Prior to Admission  Medication Sig Dispense Refill   amLODipine-benazepril (LOTREL) 10-40 MG capsule Take 1  capsule by mouth daily.     Cholecalciferol (VITAMIN D3) 50 MCG (2000 UT) TABS Take 2,000 Units by mouth daily.     diclofenac Sodium (VOLTAREN) 1 % GEL Apply 1 g topically daily as needed (Arthritis pain).     gabapentin (NEURONTIN) 300 MG capsule Take 300 mg by mouth 3 (three) times daily.     hydrALAZINE (APRESOLINE) 25 MG tablet Take 25 mg by mouth 2 (two) times daily.     ketotifen (ZADITOR) 0.025 % ophthalmic solution Place 1 drop into both eyes daily as needed (red or itching eyes).     magnesium oxide (MAG-OX) 400 MG tablet Take 400 mg by mouth daily.     Menthol, Topical Analgesic, (BIOFREEZE EX) Apply 1 application  topically daily as needed (pain).     oxyCODONE (OXY IR/ROXICODONE) 5 MG immediate release tablet Take 5 mg by mouth 3 (three) times daily as needed for pain.  0   pravastatin (PRAVACHOL) 80 MG tablet Take 80 mg by mouth daily.     RAPAFLO 8 MG CAPS capsule Take 8 mg by mouth daily. Silodosin     vitamin B-12 (CYANOCOBALAMIN) 500 MCG tablet Take 500 mcg by mouth daily.     pantoprazole (PROTONIX) 40 MG tablet Take 1 tablet (40 mg total) by mouth daily. (Patient not taking: Reported on 10/08/2021) 30 tablet 1    Results for orders placed or performed during the hospital encounter of 10/14/21 (from  the past 48 hour(s))  SARS Coronavirus 2 by RT PCR (hospital order, performed in [facility] hospital lab) *cepheid single result test* Anterior Nasal Swab     Status: None   Collection Time: 10/14/21  5:54 AM   Specimen: Anterior Nasal Swab  Result Value Ref Range   SARS Coronavirus 2 by RT PCR NEGATIVE NEGATIVE    Comment: (NOTE) SARS-CoV-2 target nucleic acids are NOT DETECTED.  The SARS-CoV-2 RNA is generally detectable in upper and lower respiratory specimens during the acute phase of infection. The lowest concentration of SARS-CoV-2 viral copies this assay can detect is 250 copies / mL. A negative result does not preclude SARS-CoV-2 infection and should not be used as  the sole basis for treatment or other patient management decisions.  A negative result may occur with improper specimen collection / handling, submission of specimen other than nasopharyngeal swab, presence of viral mutation(s) within the areas targeted by this assay, and inadequate number of viral copies (<250 copies / mL). A negative result must be combined with clinical observations, patient history, and epidemiological information.  Fact Sheet for Patients:   https://www.patel.info/  Fact Sheet for Healthcare Providers: https://hall.com/  This test is not yet approved or  cleared by the [place] FDA and has been authorized for detection and/or diagnosis of SARS-CoV-2 by FDA under an Emergency Use Authorization (EUA).  This EUA will remain in effect (meaning this test can be used) for the duration of the COVID-19 declaration under Section 564(b)(1) of the Act, 21 U.S.C. section 360bbb-3(b)(1), unless the authorization is terminated or revoked sooner.  Performed at [facility], [place] [address], [place], [place] [address]   Basic metabolic panel per protocol     Status: Abnormal   Collection Time: 10/14/21  6:02 AM  Result Value Ref Range   Sodium 140 135 - 145 mmol/L   Potassium 4.1 3.5 - 5.1 mmol/L   Chloride 104 98 - 111 mmol/L   CO2 26 22 - 32 mmol/L   Glucose, Bld 103 (H) 70 - 99 mg/dL    Comment: Glucose reference range applies only to samples taken after fasting for at least 8 hours.   BUN 10 8 - 23 mg/dL   Creatinine, Ser 0.94 0.61 - 1.24 mg/dL   Calcium 10.5 (H) 8.9 - 10.3 mg/dL   GFR, Estimated >60 >60 mL/min    Comment: (NOTE) Calculated using the CKD-EPI Creatinine Equation (2021)    Anion gap 10 5 - 15    Comment: Performed at [facility] [address] [place], [place] [address]  CBC per protocol     Status: Abnormal   Collection Time: 10/14/21  6:02 AM  Result Value Ref Range   WBC 4.7 4.0 -  10.5 K/uL   RBC 4.20 (L) 4.22 - 5.81 MIL/uL   Hemoglobin 14.0 13.0 - 17.0 g/dL   HCT 42.7 39.0 - 52.0 %   MCV 101.7 (H) 80.0 - 100.0 fL   MCH 33.3 26.0 - 34.0 pg   MCHC 32.8 30.0 - 36.0 g/dL   RDW 13.9 11.5 - 15.5 %   Platelets 243 150 - 400 K/uL   nRBC 0.0 0.0 - 0.2 %    Comment: Performed at [facility], [place] [address], [place], [place] [address]   No results found.  Review of Systems  All other systems reviewed and are negative.   Blood pressure (!) 153/64, pulse 66, temperature 98 F (36.7 C), temperature source  Oral, resp. rate 17, height '5\' 11"'$  (1.803 m), weight 83.9 kg, SpO2 97 %. Physical Exam Constitutional:      Appearance: Normal appearance. He is normal weight.  HENT:     Head: Normocephalic and atraumatic.     Right Ear: External ear normal.     Left Ear: External ear normal.     Nose: Nose normal.     Mouth/Throat:     Mouth: Mucous membranes are moist.     Comments: Floor of mouth ulcerative mass. Eyes:     Extraocular Movements: Extraocular movements intact.     Conjunctiva/sclera: Conjunctivae normal.     Pupils: Pupils are equal, round, and reactive to light.  Cardiovascular:     Rate and Rhythm: Normal rate.  Pulmonary:     Effort: Pulmonary effort is normal.  Musculoskeletal:     Cervical back: Normal range of motion.  Skin:    General: Skin is warm and dry.  Neurological:     General: No focal deficit present.     Mental Status: He is alert and oriented to person, place, and time.  Psychiatric:        Mood and Affect: Mood normal.        Behavior: Behavior normal.        Thought Content: Thought content normal.        Judgment: Judgment normal.      Assessment/Plan Floor of mouth cancer  To OR for resection of floor of mouth, bilateral neck dissections, platysma flap closure, and tracheostomy.  Plan admission to ICU following surgery with 5-7 day hospitalization expected.  Melida Quitter, MD 10/14/2021, 7:16 AM

## 2021-10-15 ENCOUNTER — Inpatient Hospital Stay (HOSPITAL_COMMUNITY): Payer: Medicare Other

## 2021-10-15 ENCOUNTER — Encounter (HOSPITAL_COMMUNITY): Payer: Self-pay | Admitting: Otolaryngology

## 2021-10-15 LAB — GLUCOSE, CAPILLARY
Glucose-Capillary: 162 mg/dL — ABNORMAL HIGH (ref 70–99)
Glucose-Capillary: 131 mg/dL — ABNORMAL HIGH (ref 70–99)
Glucose-Capillary: 144 mg/dL — ABNORMAL HIGH (ref 70–99)
Glucose-Capillary: 117 mg/dL — ABNORMAL HIGH (ref 70–99)

## 2021-10-15 MED ORDER — SODIUM CHLORIDE 0.9 % IV SOLN
12.5000 mg | Freq: Once | INTRAVENOUS | Status: AC
  Administered 2021-10-15: 12.5 mg via INTRAVENOUS
  Filled 2021-10-15: qty 12.5

## 2021-10-15 MED ORDER — ALUM & MAG HYDROXIDE-SIMETH 200-200-20 MG/5ML PO SUSP
30.0000 mL | Freq: Once | ORAL | Status: DC
  Filled 2021-10-15: qty 30

## 2021-10-15 MED ORDER — METOCLOPRAMIDE HCL 5 MG/ML IJ SOLN
5.0000 mg | Freq: Four times a day (QID) | INTRAMUSCULAR | Status: DC
  Administered 2021-10-15 – 2021-10-16 (×3): 5 mg via INTRAVENOUS
  Filled 2021-10-15 (×3): qty 2

## 2021-10-15 MED ORDER — METOCLOPRAMIDE HCL 5 MG/ML IJ SOLN
5.0000 mg | Freq: Three times a day (TID) | INTRAMUSCULAR | Status: DC
  Administered 2021-10-15: 5 mg via INTRAVENOUS
  Filled 2021-10-15: qty 2

## 2021-10-15 NOTE — Progress Notes (Addendum)
On call MD return call and plan is to give 25 mg of phenergan and Mylanta oral 51m, however per guideline we can only order 12.'5mg'$   of phenergan due to patient age. We discuss EKG. RN Will implement these order and continue to monitor patient.

## 2021-10-15 NOTE — Progress Notes (Signed)
Subjective: Vomited through the night including dislodging feeding tube.  Blood pressure was elevated but now improved.  Objective: Vital signs in last 24 hours: Temp:  [97 F (36.1 C)-99.8 F (37.7 C)] 98.1 F (36.7 C) (10/04 0728) Pulse Rate:  [69-113] 87 (10/04 0823) Resp:  [0-33] 23 (10/04 0823) BP: (132-206)/(58-113) 147/68 (10/04 0702) SpO2:  [86 %-99 %] 97 % (10/04 0823) Arterial Line BP: (174-215)/(52-68) 191/60 (10/03 2304) FiO2 (%):  [28 %] 28 % (10/04 0823) Weight:  [81.7 kg] 81.7 kg (10/03 1531) Wt Readings from Last 1 Encounters:  10/14/21 81.7 kg    Intake/Output from previous day: 10/03 0701 - 10/04 0700 In: 4872.5 [I.V.:4332.5; IV Piggyback:510.1] Out: 2800 [Urine:1750; Emesis/NG output:850; Drains:100; Blood:100] Intake/Output this shift: No intake/output data recorded.  General appearance: alert, cooperative, and no distress Neck: trach site stable, deflated cuff, neck incision and flaps healthy, normal lip movements, flap under tongue healthy and intact  Recent Labs    10/14/21 0602  WBC 4.7  HGB 14.0  HCT 42.7  PLT 243    Recent Labs    10/14/21 0602  NA 140  K 4.1  CL 104  CO2 26  GLUCOSE 103*  BUN 10  CREATININE 0.94  CALCIUM 10.5*    Medications: I have reviewed the patient's current medications.  Assessment/Plan: Floor of mouth cancer  Will have nursing place NG tube for decompression.  Continue NPO for now.  Blood pressure improved.  Will request pharmacy consult to resume home medicines via NG tube.  Continue drains.  Plan to stay in ICU for now.  D/C Foley.   LOS: 1 day   Melida Quitter 10/15/2021, 8:25 AM

## 2021-10-15 NOTE — Anesthesia Postprocedure Evaluation (Signed)
Anesthesia Post Note  Patient: Nathaniel Hicks  Procedure(s) Performed: FLOOR OF MOUTH RESECTION (Mouth) NECK DISSECTION (Bilateral: Neck) PLATYSMA FLAP CLOSURE (Bilateral: Face) TRACHEOSTOMY (Neck) DENTAL EXTRACTIONS (Mouth)     Patient location during evaluation: PACU Anesthesia Type: General Level of consciousness: awake and alert Pain management: pain level controlled Vital Signs Assessment: post-procedure vital signs reviewed and stable Respiratory status: spontaneous breathing, nonlabored ventilation and respiratory function stable Cardiovascular status: blood pressure returned to baseline Postop Assessment: no apparent nausea or vomiting Anesthetic complications: no   No notable events documented.          Marthenia Rolling

## 2021-10-15 NOTE — Progress Notes (Addendum)
Patient with projectile vomiting,  evacuating his cortrack tube up and out of his mouth. Pt requiring a full bed change and a complete bed bath. Prior to episode patient complain of nausea received Zofran. The emesis was brown/maroon in color.   Patient  reports feeling better after he vomited. EKG performed due to hypertension and vomiting although he denies chest pain. Patient EKG compared to last EKG done in July. Will continue to monitor  Cortrack tube at bedside.

## 2021-10-15 NOTE — Progress Notes (Signed)
Patient continues to vomit inspite of antiemetic given.

## 2021-10-15 NOTE — Progress Notes (Signed)
Patient c/o 5/10 burning abdominal  pain and vomited additional 100 ml, MD on call notified.

## 2021-10-15 NOTE — Progress Notes (Signed)
Another episode of projectile vomiting

## 2021-10-16 ENCOUNTER — Inpatient Hospital Stay: Payer: Self-pay

## 2021-10-16 ENCOUNTER — Inpatient Hospital Stay (HOSPITAL_COMMUNITY): Payer: Medicare Other

## 2021-10-16 DIAGNOSIS — R6510 Systemic inflammatory response syndrome (SIRS) of non-infectious origin without acute organ dysfunction: Secondary | ICD-10-CM

## 2021-10-16 DIAGNOSIS — E440 Moderate protein-calorie malnutrition: Secondary | ICD-10-CM | POA: Diagnosis not present

## 2021-10-16 DIAGNOSIS — C049 Malignant neoplasm of floor of mouth, unspecified: Secondary | ICD-10-CM

## 2021-10-16 LAB — BASIC METABOLIC PANEL
Anion gap: 9 (ref 5–15)
BUN: 7 mg/dL — ABNORMAL LOW (ref 8–23)
CO2: 29 mmol/L (ref 22–32)
Calcium: 9.4 mg/dL (ref 8.9–10.3)
Chloride: 99 mmol/L (ref 98–111)
Creatinine, Ser: 0.77 mg/dL (ref 0.61–1.24)
GFR, Estimated: >60 mL/min (ref >60)
Glucose, Bld: 159 mg/dL — ABNORMAL HIGH (ref 70–99)
Potassium: 4 mmol/L (ref 3.5–5.1)
Sodium: 137 mmol/L (ref 135–145)

## 2021-10-16 LAB — LIPASE, BLOOD: Lipase: 25 U/L (ref 11–51)

## 2021-10-16 LAB — OCCULT BLOOD GASTRIC / DUODENUM (SPECIMEN CUP)
Occult Blood, Gastric: NEGATIVE
pH, Gastric: 7

## 2021-10-16 LAB — CBC
HCT: 39.8 % (ref 39.0–52.0)
Hemoglobin: 13.1 g/dL (ref 13.0–17.0)
MCH: 33.1 pg (ref 26.0–34.0)
MCHC: 32.9 g/dL (ref 30.0–36.0)
MCV: 100.5 fL — ABNORMAL HIGH (ref 80.0–100.0)
Platelets: 229 10*3/uL (ref 150–400)
RBC: 3.96 MIL/uL — ABNORMAL LOW (ref 4.22–5.81)
RDW: 13.7 % (ref 11.5–15.5)
WBC: 15.5 10*3/uL — ABNORMAL HIGH (ref 4.0–10.5)
nRBC: 0 % (ref 0.0–0.2)

## 2021-10-16 LAB — HEPATIC FUNCTION PANEL
ALT: 9 U/L (ref 0–44)
AST: 16 U/L (ref 15–41)
Albumin: 3.2 g/dL — ABNORMAL LOW (ref 3.5–5.0)
Alkaline Phosphatase: 41 U/L (ref 38–126)
Bilirubin, Direct: 0.2 mg/dL (ref 0.0–0.2)
Indirect Bilirubin: 0.7 mg/dL (ref 0.3–0.9)
Total Bilirubin: 0.9 mg/dL (ref 0.3–1.2)
Total Protein: 7 g/dL (ref 6.5–8.1)

## 2021-10-16 LAB — GLUCOSE, CAPILLARY
Glucose-Capillary: 114 mg/dL — ABNORMAL HIGH (ref 70–99)
Glucose-Capillary: 117 mg/dL — ABNORMAL HIGH (ref 70–99)
Glucose-Capillary: 126 mg/dL — ABNORMAL HIGH (ref 70–99)
Glucose-Capillary: 136 mg/dL — ABNORMAL HIGH (ref 70–99)
Glucose-Capillary: 152 mg/dL — ABNORMAL HIGH (ref 70–99)

## 2021-10-16 LAB — HEMOGLOBIN AND HEMATOCRIT, BLOOD
HCT: 39.3 % (ref 39.0–52.0)
Hemoglobin: 12.7 g/dL — ABNORMAL LOW (ref 13.0–17.0)

## 2021-10-16 LAB — PROCALCITONIN: Procalcitonin: 0.17 ng/mL

## 2021-10-16 LAB — LACTIC ACID, PLASMA: Lactic Acid, Venous: 1.9 mmol/L (ref 0.5–1.9)

## 2021-10-16 LAB — PREALBUMIN: Prealbumin: 14 mg/dL — ABNORMAL LOW (ref 18–38)

## 2021-10-16 LAB — BRAIN NATRIURETIC PEPTIDE: B Natriuretic Peptide: 231.9 pg/mL — ABNORMAL HIGH (ref 0.0–100.0)

## 2021-10-16 MED ORDER — SODIUM CHLORIDE 0.9 % IV SOLN
3.0000 g | Freq: Four times a day (QID) | INTRAVENOUS | Status: AC
  Administered 2021-10-16 – 2021-10-23 (×28): 3 g via INTRAVENOUS
  Filled 2021-10-16 (×28): qty 8

## 2021-10-16 MED ORDER — PANTOPRAZOLE SODIUM 40 MG IV SOLR
40.0000 mg | Freq: Two times a day (BID) | INTRAVENOUS | Status: DC
  Administered 2021-10-16 – 2021-10-20 (×10): 40 mg via INTRAVENOUS
  Filled 2021-10-16 (×10): qty 10

## 2021-10-16 MED ORDER — METOCLOPRAMIDE HCL 5 MG/ML IJ SOLN
10.0000 mg | Freq: Four times a day (QID) | INTRAMUSCULAR | Status: DC
  Administered 2021-10-16 – 2021-10-21 (×20): 10 mg via INTRAVENOUS
  Filled 2021-10-16 (×20): qty 2

## 2021-10-16 MED ORDER — SODIUM CHLORIDE 0.9 % IV SOLN: INTRAVENOUS | Status: DC

## 2021-10-16 MED ORDER — KCL IN DEXTROSE-NACL 20-5-0.45 MEQ/L-%-% IV SOLN: INTRAVENOUS | Status: AC

## 2021-10-16 NOTE — Progress Notes (Signed)
Went into patient's room because he pressed the call bell. As I walked in, he pointed in his mouth in which I saw his NGT coiled. I removed his NGT and reached out to [facility] about this incident.

## 2021-10-16 NOTE — Progress Notes (Signed)
Called main pharmacy to send up occult card. Not located in 106M or 14M Pyxis. Waiting on the card in order to perform the gastric occult test.

## 2021-10-16 NOTE — Progress Notes (Signed)
Pt refusing NGT reinsertion. Dr. Tamala Julian aware. If pt develops vomiting overnight, will have to reinsert.

## 2021-10-16 NOTE — Progress Notes (Signed)
SLP Cancellation Note  Patient Details Name: Nathaniel Hicks MRN: [id number] DOB: [birth year]   Cancelled treatment:       Reason Eval/Treat Not Completed: Patient not medically ready. RN would like pain better controlled prior to PMSV trial. She plans to address and contact me  Herbie Baltimore, [place]  [facility] Services Secure Chat Preferred Office [phone]   Lynann Beaver 10/16/2021, 9:44 AM

## 2021-10-16 NOTE — Progress Notes (Signed)
During my AM assessment, I noticed maroon, brown output coming out this patient's NGT. Pt denies pain. Dr. Tamala Julian [facility] MD) made aware. Received call to advance NGT by 10 cm per AM CXR. Notified MD. Advanced NGT and abd xray ordered STAT. Orders for occult blood from NGT obtained.

## 2021-10-16 NOTE — Progress Notes (Signed)
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  GOAL:   Patient will meet greater than or equal to 90% of their needs  MONITOR:   Diet advancement, Labs, Weight trends, I & O's  REASON FOR ASSESSMENT:   Consult New TPN/TNA  ASSESSMENT:   81 year old male who presented on 10/03 for floor of mouth resection, neck dissection, platysma flap closure, tracheostomy, and dental extractions. PMH of floor of mouth cancer, arthritis, BPH, HOH, HLD, HTN, testicular cancer, vitamin D deficiency.  10/03 - s/p floor of mouth resection, tracheostomy, platysma flap closure, dental extractions, bilateral selective neck dissections, small-bore NG tube placement 10/04 - multiple episodes of projectile vomiting which caused small-bore NG tube to become dislodged, NG tube placed for decompression  Discussed pt with RN and during ICU rounds. Pt on trach collar. Pt with significant brown/maroon output from NG tube which is to LIWS. Pt has been NPO since day of surgery on 10/03.  Pt with recent hospital admission in July. Reviewed RD notes from this admission. Pt required NG tube placement for enteral nutrition during this admission. Pt had been experiencing jaw swelling and pain for 1 month prior to this admission with dysphagia and decreased PO intake.  Pt lethargic at time of RD visit and unable to communicate clearly. RD will attempt to obtain further diet and weight history at follow-up.  Based on NFPE, pt meets criteria for malnutrition. Given malnutrition is present, recommend initiation of TPN within  next 24 hours.  Medications reviewed and include: IV reglan 10 mg q 6 hours, IV protonix, IV abx IVF: D5 and 1/2NS with KCl @ 100 ml/hr  Labs reviewed: WBC 15.5 CBG's: 117-152 x 24 hours  UOP: 1500 ml x 24 hours NGT: 2900 ml x 24 hours R JP drain: 40 ml x 24 hours L JP drain: 40 ml x 24 hours I/O's: +33 ml since admit  NUTRITION - FOCUSED PHYSICAL EXAM:  Flowsheet Row Most Recent Value  Orbital Region Mild depletion  Upper Arm Region Mild depletion  Thoracic and Lumbar Region Mild depletion  Buccal Region Mild depletion  Temple Region Mild depletion  Clavicle Bone Region Moderate depletion  Clavicle and Acromion Bone Region Moderate depletion  Scapular Bone Region Mild depletion  Dorsal Hand Mild depletion  Patellar Region Moderate depletion  Anterior Thigh Region Moderate depletion  Posterior Calf Region Moderate depletion  Edema (RD Assessment) Mild  Hair Reviewed  Eyes Reviewed  Mouth Reviewed  Skin Reviewed  Nails Reviewed       Diet Order:   Diet Order             Diet NPO time specified  Diet effective now                   EDUCATION NEEDS:   Not appropriate for education at this time  Skin:  Skin Assessment: Skin Integrity Issues: Incisions: neck  Last BM:  no documented BM  Height:   Ht Readings from Last 1 Encounters:  10/14/21 '5\' 11"'$  (1.803 m)    Weight:   Wt Readings from Last 1 Encounters:  10/14/21 81.7 kg  BMI:  Body mass index is 25.12 kg/m.  Estimated Nutritional Needs:   Kcal:  2000-2200  Protein:  110-130 grams  Fluid:  >2.0 L    Gustavus Bryant, MS, RD, LDN Inpatient Clinical Dietitian Please see AMiON for contact information.

## 2021-10-16 NOTE — Progress Notes (Signed)
Subjective: Some less vomiting but NG tube remains in place, some darker output.  Low grade fever overnight.  Objective: Vital signs in last 24 hours: Temp:  [97.7 F (36.5 C)-100.6 F (38.1 C)] 99.7 F (37.6 C) (10/05 0730) Pulse Rate:  [70-106] 104 (10/05 0700) Resp:  [7-28] 18 (10/05 0700) BP: (141-180)/(59-102) 152/70 (10/05 0700) SpO2:  [93 %-97 %] 95 % (10/05 0700) FiO2 (%):  [28 %] 28 % (10/05 0600) Wt Readings from Last 1 Encounters:  10/14/21 81.7 kg    Intake/Output from previous day: 10/04 0701 - 10/05 0700 In: 2625.9 [I.V.:2625.9] Out: 4480 [Urine:1500; Emesis/NG output:2900; Drains:80] Intake/Output this shift: No intake/output data recorded.  General appearance: alert, cooperative, and no distress Neck: neck incision clean and intact, no fluid collection, drains functioning, trach site stable with deflated cuff, skin paddle under tongue darker in color  Recent Labs    10/14/21 0602 10/16/21 0711  WBC 4.7 15.5*  HGB 14.0 13.1  HCT 42.7 39.8  PLT 243 229    Recent Labs    10/14/21 0602  NA 140  K 4.1  CL 104  CO2 26  GLUCOSE 103*  BUN 10  CREATININE 0.94  CALCIUM 10.5*    Medications: I have reviewed the patient's current medications.  Assessment/Plan: Floor of mouth cancer, vomiting, fever  OK to transfer out of ICU.  Will request hospitalist consultation.  Will start Unasyn.   LOS: 2 days   Melida Quitter 10/16/2021, 8:14 AM

## 2021-10-16 NOTE — Consult Note (Addendum)
Initial Consultation Note   Patient: Nathaniel Hicks DOB: October 19, 1940 PCP: Celene Squibb, MD DOA: 10/14/2021 DOS: the patient was seen and examined on 10/16/2021 Primary service: Melida Quitter, MD  Referring physician: Dr. Redmond Baseman Reason for consult: Medical management  Assessment/Plan: Assessment and Plan:  Floor mouth cancer Patient status post floor resection with Dr. Redmond Baseman on 10/3 and underwent  resection of the floor of mouth, bilateral neck dissections, phlegmonous flap closure, and tracheostomyresection. -Follow-up surgical pathology -Per ENT  SIRS Patient was noted to be febrile up to 100.6 F overnight, tachycardic, and tachypneic with WBC elevated to 15.5 meeting SIRS criteria.  Abdominal x-ray noted retrocardiac left lower lobe opacity from 10/4.  Due to the patient's multiple episodes of vomiting questioned the possibility of aspiration or atelectasis or wound infection.  Patient had been empirically started on Unasyn. -Check blood cultures -Add on lactic acid and procalcitonin -Follow-up checks x-ray -Continue empiric antibiotics with Unasyn -Orders placed to recheck CBC tomorrow morning  Nausea and vomiting Patient  had persistent nausea and vomiting since surgery.  Abdominal x-rays after placement of NG tube not show signs of obstruction.  Patient had been placed on Zofran and Reglan.  Coffee ground like ants to contents from nasogastric suction.  Question of possibility of blood.  Question if secondary to gastritis from NG tube versus Mallory-Weiss tear from vomiting versus other. -Check gastric occult -Recheck H&H -Check LFTs and lipase -Protonix 40 mg IV twice daily -Continue antiemetics as needed -Consider need of formal gastroenterology consult if symptoms do not appear to improve in a.m.  Cardiomegaly On physical exam patient does have clubbing present and chest x-ray noted cardiomegaly.  Patient does not appear to be grossly fluid overloaded at this  time. -Strict intake and output -Daily weights -Check BNP  Moderate protein calorie malnutrition Patient noted to have poor nutrition at baseline and free albumin noted to be 14.  Unable to feed per nasogastric tube at this time due to concern for bleeding. -Consider starting TPN if unable to start tube feeds tomorrow  Essential hypertension Blood pressures are currently maintained. -Continue IV labetalol as needed.   TRH will continue to follow the patient.  HPI: Nathaniel Hicks is a 81 y.o. 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 Redmond Baseman on 10/3.  Patient underwent resection of the floor of mouth, bilateral neck dissections, Platysma flap closure, and tracheostomy.  He was admitted into the ICU postop.  A cortrack had been placed, but early yesterday morning patient had developed projectile vomiting for which a core track was dislodged.  Patient had been given antiemetics and IV fluids without improvement in symptoms.  Subsequently nasogastric tube was placed and patient noted to have darker output from NG tube which was placed to low intermittent suction, and patient was noted to have fever up to 100.6 F overnight.  X-rays had noted concern for possible opacities of the left lung field concerning for atelectasis.  Labs from 10/3-> 10/5 noted hemoglobin 14-> 13.1 and WBC 4.7->15.5.  TRH called to assist in management.  Patient denies any complaints of abdominal pain at this time.  Review of Systems: As mentioned in the history of present illness. All other systems reviewed and are negative. Past Medical History:  Diagnosis Date   Arthritis    BPH (benign prostatic hyperplasia)    Difficult intubation    HOH (  hard of hearing)    Hyperlipidemia 11/02/2017   Hypertension    Hypertension 11/02/2017   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[place])     2014   Vitamin D deficiency 11/02/2017   Past Surgical History:  Procedure Laterality Date   COLONOSCOPY N/A 11/24/2012   Procedure: COLONOSCOPY;  Surgeon: Rogene Houston, MD;  [facility];  Service: Endoscopy;  Laterality: N/A;  830-moved to [place] notified pt   FLOOR OF MOUTH BIOPSY N/A 10/14/2021   Procedure: FLOOR OF MOUTH RESECTION;  Surgeon: Melida Quitter, MD;  Location: [facility];  Service: ENT;  Laterality: N/A;   HEMORROIDECTOMY     KNEE ARTHROSCOPY WITH LATERAL MENISECTOMY Right 08/31/2017   Procedure: KNEE ARTHROSCOPY WITH LATERAL MENISECTOMY;  Surgeon: Carole Civil, MD;  Location: [facility];  Service: Orthopedics;  Laterality: Right;   LESION EXCISION N/A 03/23/2012   Procedure: EXCISION NEOPLASM SCALP ;  Surgeon: Jamesetta So, MD;  Location: [facility];  Service: General;  Laterality: N/A;  Excision of Scalp Neoplasm   ORCHIECTOMY Right 12/20/2012   Procedure: RIGHT RADICAL ORCHIECTOMY/POSSIBLE BX RIGHT TESTICLE;  Surgeon: Marissa Nestle, MD;  Location: [facility];  Service: Urology;  Laterality: Right;   PROSTATE SURGERY     RADICAL NECK DISSECTION Bilateral 10/14/2021   Procedure: NECK DISSECTION;  Surgeon: Melida Quitter, MD;  Location: [facility];  Service: ENT;  Laterality: Bilateral;   SCALP LACERATION REPAIR     [facility]-Dr Tamala Julian   SKIN FULL THICKNESS GRAFT Bilateral 10/14/2021   Procedure: PLATYSMA FLAP CLOSURE;  Surgeon: Melida Quitter, MD;  Location: [facility];  Service: ENT;  Laterality: Bilateral;   TOOTH EXTRACTION  10/14/2021   Procedure: DENTAL EXTRACTIONS;  Surgeon: Melida Quitter, MD;  Location: [facility];  Service: ENT;;   TRACHEOSTOMY TUBE PLACEMENT N/A 10/14/2021   Procedure: TRACHEOSTOMY;  Surgeon: Melida Quitter, MD;  Location: [facility];  Service: ENT;  Laterality: N/A;   Social History:  reports that he has quit smoking. His smoking use included cigarettes. He has a 12.50 pack-year smoking history. He has never used smokeless tobacco. He reports that he does not currently  use alcohol. He reports that he does not use drugs.  No Known Allergies  Family History  Problem Relation Age of Onset   Cancer Brother    Cancer Brother     Prior to Admission medications   Medication Sig Start Date End Date Taking? Authorizing Provider  amLODipine-benazepril (LOTREL) 10-40 MG capsule Take 1 capsule by mouth daily. 05/07/21  Yes [provider]  Cholecalciferol (VITAMIN D3) 50 MCG (2000 UT) TABS Take 2,000 Units by mouth daily.   Yes [provider]  diclofenac Sodium (VOLTAREN) 1 % GEL Apply 1 g topically daily as needed (Arthritis pain).   Yes [provider]  gabapentin (NEURONTIN) 300 MG capsule Take 300 mg by mouth 3 (three) times daily.   Yes [provider]  hydrALAZINE (APRESOLINE) 25 MG tablet Take 25 mg by mouth 2 (two) times daily. 05/08/21  Yes [provider]  ketotifen (ZADITOR) 0.025 % ophthalmic solution Place 1 drop into both eyes daily as needed (red or itching eyes).   Yes [provider]  magnesium oxide (MAG-OX) 400 MG tablet Take 400 mg by mouth daily.   Yes [provider]  Menthol, Topical Analgesic, (BIOFREEZE EX) Apply 1 application  topically daily as needed (pain).   Yes [provider]  oxyCODONE (OXY IR/ROXICODONE) 5 MG immediate release tablet Take 5 mg by mouth 3 (  three) times daily as needed for pain. 07/13/17  Yes [provider]  pravastatin (PRAVACHOL) 80 MG tablet Take 80 mg by mouth daily.   Yes [provider]  RAPAFLO 8 MG CAPS capsule Take 8 mg by mouth daily. Silodosin 10/13/13  Yes [provider]  vitamin B-12 (CYANOCOBALAMIN) 500 MCG tablet Take 500 mcg by mouth daily.   Yes [provider]  pantoprazole (PROTONIX) 40 MG tablet Take 1 tablet (40 mg total) by mouth daily. Patient not taking: Reported on 10/08/2021 07/23/21 09/21/21  Mercy Riding, MD    Physical Exam: Vitals:   10/16/21 0600 10/16/21 0700 10/16/21 0730 10/16/21  0800  BP: (!) 173/79 (!) 152/70  (!) 163/71  Pulse: 98 (!) 104  97  Resp: '19 18  18  '$ Temp: (!) 100.5 F (38.1 C)  99.7 F (37.6 C)   TempSrc: Axillary  Oral   SpO2: 94% 95%  95%  Weight:      Height:       Exam  Constitutional: Elderly male who appears chronically ill clubbing Eyes: PERRL, lids and conjunctivae normal ENMT: Mucous membranes are dry.   Neck: Trachea ostomy in place with trach collar on 5 L of oxygen Respiratory: clear to auscultation bilaterally, no wheezing, no crackles.   Cardiovascular: Regular rate and rhythm, no murmurs / rubs / gallops.   Abdomen: no tenderness, no masses palpated.  Bowel sounds positive.  Musculoskeletal: Clubbing present. No joint deformity upper and lower extremities.   Skin: Postop wound present with drains present Neurologic: CN 2-12 grossly intact. Strength 5/5 in all 4.  Psychiatric: Normal judgment and insight. Alert and oriented x 3.   Data Reviewed:   Reviewed labs, imaging and pertinent records as noted above in HPI   Family Communication: Brother updated at bedside Primary team communication: Thank you very much for involving [place] in the care of your patient.  Author: Norval Morton, MD 10/16/2021 8:39 AM  For on call review [email or web address].

## 2021-10-17 ENCOUNTER — Inpatient Hospital Stay (HOSPITAL_COMMUNITY): Payer: Medicare Other

## 2021-10-17 DIAGNOSIS — I10 Essential (primary) hypertension: Secondary | ICD-10-CM

## 2021-10-17 DIAGNOSIS — E440 Moderate protein-calorie malnutrition: Secondary | ICD-10-CM | POA: Diagnosis not present

## 2021-10-17 DIAGNOSIS — C069 Malignant neoplasm of mouth, unspecified: Secondary | ICD-10-CM | POA: Diagnosis not present

## 2021-10-17 DIAGNOSIS — E785 Hyperlipidemia, unspecified: Secondary | ICD-10-CM | POA: Diagnosis not present

## 2021-10-17 LAB — BASIC METABOLIC PANEL
Anion gap: 10 (ref 5–15)
BUN: 12 mg/dL (ref 8–23)
CO2: 24 mmol/L (ref 22–32)
Calcium: 9.3 mg/dL (ref 8.9–10.3)
Chloride: 107 mmol/L (ref 98–111)
Creatinine, Ser: 0.8 mg/dL (ref 0.61–1.24)
GFR, Estimated: >60 mL/min (ref >60)
Glucose, Bld: 109 mg/dL — ABNORMAL HIGH (ref 70–99)
Potassium: 4.1 mmol/L (ref 3.5–5.1)
Sodium: 141 mmol/L (ref 135–145)

## 2021-10-17 LAB — GLUCOSE, CAPILLARY
Glucose-Capillary: 102 mg/dL — ABNORMAL HIGH (ref 70–99)
Glucose-Capillary: 105 mg/dL — ABNORMAL HIGH (ref 70–99)
Glucose-Capillary: 106 mg/dL — ABNORMAL HIGH (ref 70–99)
Glucose-Capillary: 113 mg/dL — ABNORMAL HIGH (ref 70–99)
Glucose-Capillary: 115 mg/dL — ABNORMAL HIGH (ref 70–99)
Glucose-Capillary: 92 mg/dL (ref 70–99)

## 2021-10-17 LAB — CBC
HCT: 36.6 % — ABNORMAL LOW (ref 39.0–52.0)
Hemoglobin: 12 g/dL — ABNORMAL LOW (ref 13.0–17.0)
MCH: 33.3 pg (ref 26.0–34.0)
MCHC: 32.8 g/dL (ref 30.0–36.0)
MCV: 101.7 fL — ABNORMAL HIGH (ref 80.0–100.0)
Platelets: 201 10*3/uL (ref 150–400)
RBC: 3.6 MIL/uL — ABNORMAL LOW (ref 4.22–5.81)
RDW: 13.5 % (ref 11.5–15.5)
WBC: 14.3 10*3/uL — ABNORMAL HIGH (ref 4.0–10.5)
nRBC: 0 % (ref 0.0–0.2)

## 2021-10-17 LAB — TRIGLYCERIDES: Triglycerides: 109 mg/dL (ref <150)

## 2021-10-17 LAB — PHOSPHORUS: Phosphorus: 2.3 mg/dL — ABNORMAL LOW (ref 2.5–4.6)

## 2021-10-17 LAB — SURGICAL PATHOLOGY

## 2021-10-17 LAB — MAGNESIUM: Magnesium: 2.1 mg/dL (ref 1.7–2.4)

## 2021-10-17 MED ORDER — SODIUM PHOSPHATES 45 MMOLE/15ML IV SOLN
20.0000 mmol | Freq: Once | INTRAVENOUS | Status: AC
  Administered 2021-10-17: 20 mmol via INTRAVENOUS
  Filled 2021-10-17: qty 6.67

## 2021-10-17 MED ORDER — OSMOLITE 1.5 CAL PO LIQD
1000.0000 mL | ORAL | Status: DC
  Administered 2021-10-17 – 2021-10-23 (×7): 1000 mL
  Filled 2021-10-17 (×5): qty 1000

## 2021-10-17 MED ORDER — PROSOURCE TF20 ENFIT COMPATIBL EN LIQD
60.0000 mL | Freq: Two times a day (BID) | ENTERAL | Status: DC
  Administered 2021-10-18 – 2021-10-22 (×10): 60 mL
  Filled 2021-10-17 (×10): qty 60

## 2021-10-17 NOTE — TOC Initial Note (Signed)
Transition of Care [facility]) - Initial/Assessment Note    Patient Details  Name: Nathaniel Hicks MRN: [id number] Date of Birth: [birth year]  [facility] CM/SW Contact:    Tom-Johnson, Renea Ee, RN Phone Number: 10/17/2021, 4:31 PM  Clinical Narrative:                  CM spoke with patient at bedside via his Passey-Muir valve about needs for post hospital transition.  Admitted for Oral cancer, s/p floor of mouth resection, tracheostomy, platysma flap closure, dental extractions and bilateral selective neck dissection with drains. Plan to change to cuffless Trach on Sunday. NG tube placed, to re-start enteral feedings.  From home alone. Does not have children, has three siblings. Sister Nathaniel Hicks is his primary contact and assists with his care. Has a cane, walker, wheelchair and grab bars at home.  PCP is Celene Squibb, MD and uses [facility] in [place].  No PT/OT recommendations noted at this time. CM will plan discharge needs accordingly as patient progresses towards discharge.     Barriers to Discharge: Continued Medical Work up   Patient Goals and CMS Choice Patient states their goals for this hospitalization and ongoing recovery are:: To return home CMS Medicare.gov Compare Post Acute Care list provided to:: Patient    Expected Discharge Plan and Services     Discharge Planning Services: CM Consult   Living arrangements for the past 2 months: Apartment                                      Prior Living Arrangements/Services Living arrangements for the past 2 months: Apartment Lives with:: Self Patient language and need for interpreter reviewed:: Yes Do you feel safe going back to the place where you live?: Yes      Need for Family Participation in Patient Care: Yes (Comment) Care giver support system in place?: Yes (comment) Current home services: DME (Cane, walker, wheelchair, grab bars.) Criminal Activity/Legal Involvement Pertinent to  Current Situation/Hospitalization: No - Comment as needed  Activities of Daily Living Home Assistive Devices/Equipment: Eyeglasses, Scales, Dentures (specify type), Cane (specify quad or straight), Wheelchair ADL Screening (condition at time of admission) Patient's cognitive ability adequate to safely complete daily activities?: Yes Is the patient deaf or have difficulty hearing?: Yes Does the patient have difficulty seeing, even when wearing glasses/contacts?: No Does the patient have difficulty concentrating, remembering, or making decisions?: No Patient able to express need for assistance with ADLs?: Yes Does the patient have difficulty dressing or bathing?: No Independently performs ADLs?: Yes (appropriate for developmental age) Does the patient have difficulty walking or climbing stairs?: No Weakness of Legs: None Weakness of Arms/Hands: None  Permission Sought/Granted Permission sought to share information with : Case Manager, Family Supports Permission granted to share information with : Yes, Verbal Permission Granted              Emotional Assessment Appearance:: Appears stated age Attitude/Demeanor/Rapport: Engaged Affect (typically observed): Accepting, Appropriate, Calm, Hopeful, Pleasant Orientation: : Oriented to Self, Oriented to Place, Oriented to Situation Alcohol / Substance Use: Not Applicable Psych Involvement: No (comment)  Admission diagnosis:  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 [facility]) [C04.9] Patient Active Problem List   Diagnosis Date Noted   Malnutrition of moderate degree 10/16/2021   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[place]) 10/14/2021  Abscess of oral space 07/23/2021   Tobacco use disorder 07/23/2021   Physical deconditioning 07/23/2021   Sepsis ([place]) 07/23/2021   Elevated troponin 07/23/2021   Left facial swelling 07/14/2021   BPH with obstruction/lower urinary tract symptoms 11/05/2020   History of  primary testicular cancer; Stage 1b seminoma; 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 [place] [address] Phone: [phone] Fax: [phone]     Social Determinants of Health (SDOH) Interventions    Readmission Risk Interventions     No data to display

## 2021-10-17 NOTE — Progress Notes (Addendum)
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  Ongoing, being addressed via initiation of tube feeds  GOAL:   Patient will meet greater than or equal to 90% of their needs  Met via TF at goal  MONITOR:   Diet advancement, Labs, Weight trends, TF tolerance, I & O's  REASON FOR ASSESSMENT:   Consult Enteral/tube feeding initiation and management  ASSESSMENT:   81 year old male who presented on 10/03 for floor of mouth resection, neck dissection, platysma flap closure, tracheostomy, and dental extractions. PMH of floor of mouth cancer, arthritis, BPH, HOH, HLD, HTN, testicular cancer, vitamin D deficiency.  10/03 - s/p floor of mouth resection, tracheostomy, platysma flap closure, dental extractions, bilateral selective neck dissections, small-bore NG tube placement 10/04 - multiple episodes of projectile vomiting which caused small-bore NG tube to become dislodged, NG tube placed for decompression 10/05 - NG tube became dislodged and was removed, pt refusing NG tube reinsertion 10/06 - NG tube replaced (tip post-pyloric)  Discussed pt with RN and during ICU rounds.  RN to placed NG tube at bedside since Cortrak service not available until 10/09. Consult received for enteral nutrition initiation and management. Discussed pt with ENT MD. Per MD, if not able to replace NG  tube, pt may need PEG placement. Pt not ready for swallowing assessment/oral intake yet per ENT. SLP following.  Admit weight: 83.9 kg Current weight: 78.9 kg  Medications reviewed and include: IV reglan 10 mg q 6 hours, IV protonix, IV abx, IV sodium phosphate 20 mmol IVF: NS @ 50 ml/hr  Labs reviewed: phosphorus 2.3, WBC 14.3  UOP: 600 ml + 2 unmeasured occurrences x 24 hours R JP drain: 20 ml x 24 hours L JP drain: 20 ml x 24 hours I/O's: +546 ml since admit  Diet Order:   Diet Order             Diet NPO time specified  Diet effective now                   EDUCATION NEEDS:   Not appropriate for education at this time  Skin:  Skin Assessment: Skin Integrity Issues: Incisions: neck  Last BM:  no documented BM  Height:   Ht Readings from Last 1 Encounters:  10/14/21 _0  (1.803 m)    Weight:   Wt Readings from Last 1 Encounters:  10/17/21 78.9 kg    BMI:  Body mass index is 24.26 kg/m.  Estimated Nutritional Needs:   Kcal:  2000-2200  Protein:  110-130 grams  Fluid:  >2.0 L    Gustavus Bryant, MS, RD, LDN Inpatient Clinical Dietitian Please see AMiON for contact information.

## 2021-10-17 NOTE — Progress Notes (Signed)
PROGRESS NOTE        PATIENT DETAILS Name: Nathaniel Hicks Age: 81 y.o. Sex: male Date of Birth: 05/17/1940 Admit Date: 10/14/2021 Admitting Physician Melida Quitter, MD [facility]:CWCB, Edwinna Areola, MD  Brief Summary: Patient is a 81 y.o.  male with history of head/Cancer (floor of the mouth)-brought to the hospital by ENT for floor of mouth resection, tracheotomy, neck dissection-subsequently admitted to the ICU for close monitoring-postoperative course complicated by nausea/vomiting requiring NG tube insertion.  Hospitalist service was consulted for management of possible aspiration pneumonia.  Significant microbiology data: 10/3>> COVID PCR: Negative 10/5>> blood culture: Negative   Subjective: Pulled NG tube out yesterday-no further nausea or vomiting.  Remains on room air.  Objective: Vitals: Blood pressure (!) 139/59, pulse 79, temperature 99.1 F (37.3 C), temperature source Oral, resp. rate 17, height '5\' 11"'$  (1.803 m), weight 78.9 kg, SpO2 95 %.   Exam: Gen Exam:Alert awake-not in any distress HEENT:atraumatic, normocephalic Chest: B/L clear to auscultation anteriorly CVS:S1S2 regular Abdomen:soft non tender, non distended Extremities:no edema Neurology: Non focal Skin: no rash  Pertinent Labs/Radiology:    Latest Ref Rng & Units 10/17/2021    6:31 AM 10/16/2021    2:34 PM 10/16/2021    7:11 AM  CBC  WBC 4.0 - 10.5 K/uL 14.3   15.5   Hemoglobin 13.0 - 17.0 g/dL 12.0  12.7  13.1   Hematocrit 39.0 - 52.0 % 36.6  39.3  39.8   Platelets 150 - 400 K/uL 201   229     Lab Results  Component Value Date   NA 137 10/16/2021   K 4.0 10/16/2021   CL 99 10/16/2021   CO2 29 10/16/2021      Assessment/Plan: Aspiration pneumonia SIRS Improved-afebrile overnight-continue Unasyn Follow cultures  Nausea/vomiting Unclear etiology-could have been postoperative ileus. All prior x-rays were done after NG tube was inserted Patient pulled out NG tube last  night-no further nausea/vomiting Continue to monitor closely-follow clinical course  HTN BP on the higher side but relatively stable All oral medications on hold as patient n.p.o. We will use IV hydralazine as needed for now.  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 Redmond Baseman on 10/6-not ready for oral intake yet If Patient agrees-we will replace NG tube and start NG tube feedings Defer further to ENT  Nutrition Status: Nutrition Problem: Moderate Malnutrition Etiology: chronic illness (floor of mouth cancer) Signs/Symptoms: mild fat depletion, moderate muscle depletion Interventions: Refer to RD note for recommendations    BMI: Estimated body mass index is 24.26 kg/m as calculated from the following:   Height as of this encounter: '5\' 11"'$  (1.803 m).   Weight as of this encounter: 78.9 kg.   Code status:   Code Status: Full Code   DVT Prophylaxis: Place and maintain sequential compression device Start: 10/16/21 1321 SCDs Start: 10/14/21 1520   Family Communication:  None at bedside  Disposition Plan: Defer to primary service  Diet: Diet Order             Diet NPO time specified  Diet effective now                     Antimicrobial agents: Anti-infectives (From admission, onward)    Start  Dose/Rate Route Frequency Ordered Stop   10/16/21 0900  Ampicillin-Sulbactam (UNASYN) 3 g in sodium chloride 0.9 % 100 mL IVPB        3 g 200 mL/hr over 30 Minutes Intravenous Every 6 hours 10/16/21 0818     10/14/21 1530  ceFAZolin (ANCEF) IVPB 1 g/50 mL premix        1 g 100 mL/hr over 30 Minutes Intravenous Every 8 hours 10/14/21 1519 10/15/21 0537   10/14/21 0600  ceFAZolin (ANCEF) IVPB 2g/100 mL premix        2 g 200 mL/hr over 30 Minutes Intravenous On call to O.R. 10/14/21 0554 10/14/21 1152        MEDICATIONS: Scheduled Meds:  alum & mag  hydroxide-simeth  30 mL Oral Once   bacitracin  1 Application Topical A2Z   Chlorhexidine Gluconate Cloth  6 each Topical Q0600   metoCLOPramide (REGLAN) injection  10 mg Intravenous Q6H   mouth rinse  15 mL Mouth Rinse 4 times per day   pantoprazole (PROTONIX) IV  40 mg Intravenous Q12H   Continuous Infusions:  sodium chloride 10 mL/hr at 10/17/21 0700   sodium chloride 50 mL/hr at 10/17/21 0700   ampicillin-sulbactam (UNASYN) IV 3 g (10/17/21 0856)   PRN Meds:.sodium chloride, HYDROcodone-acetaminophen, labetalol, morphine injection, ondansetron (ZOFRAN) IV, mouth rinse   I have personally reviewed following labs and imaging studies  LABORATORY DATA: CBC: Recent Labs  Lab 10/14/21 0602 10/16/21 0711 10/16/21 1434 10/17/21 0631  WBC 4.7 15.5*  --  14.3*  HGB 14.0 13.1 12.7* 12.0*  HCT 42.7 39.8 39.3 36.6*  MCV 101.7* 100.5*  --  101.7*  PLT 243 229  --  308    Basic Metabolic Panel: Recent Labs  Lab 10/14/21 0602 10/16/21 0711  NA 140 137  K 4.1 4.0  CL 104 99  CO2 26 29  GLUCOSE 103* 159*  BUN 10 7*  CREATININE 0.94 0.77  CALCIUM 10.5* 9.4    GFR: Estimated Creatinine Clearance: 77.1 mL/min (by C-G formula based on SCr of 0.77 mg/dL).  Liver Function Tests: Recent Labs  Lab 10/16/21 1118  AST 16  ALT 9  ALKPHOS 41  BILITOT 0.9  PROT 7.0  ALBUMIN 3.2*   Recent Labs  Lab 10/16/21 1118  LIPASE 25   No results for input(s): "AMMONIA" in the last 168 hours.  Coagulation Profile: No results for input(s): "INR", "PROTIME" in the last 168 hours.  Cardiac Enzymes: No results for input(s): "CKTOTAL", "CKMB", "CKMBINDEX", "TROPONINI" in the last 168 hours.  BNP (last 3 results) No results for input(s): "PROBNP" in the last 8760 hours.  Lipid Profile: No results for input(s): "CHOL", "HDL", "LDLCALC", "TRIG", "CHOLHDL", "LDLDIRECT" in the last 72 hours.  Thyroid Function Tests: No results for input(s): "TSH", "T4TOTAL", "FREET4", "T3FREE",  "THYROIDAB" in the last 72 hours.  Anemia Panel: No results for input(s): "VITAMINB12", "FOLATE", "FERRITIN", "TIBC", "IRON", "RETICCTPCT" in the last 72 hours.  Urine analysis:    Component Value Date/Time   COLORURINE YELLOW 07/14/2021 0732   APPEARANCEUR CLEAR 07/14/2021 0732   APPEARANCEUR Clear 05/06/2021 1322   LABSPEC 1.023 07/14/2021 0732   PHURINE 7.0 07/14/2021 0732   GLUCOSEU NEGATIVE 07/14/2021 0732   HGBUR NEGATIVE 07/14/2021 0732   BILIRUBINUR NEGATIVE 07/14/2021 0732   BILIRUBINUR Negative 05/06/2021 1322   KETONESUR NEGATIVE 07/14/2021 0732   PROTEINUR 30 (A) 07/14/2021 0732   NITRITE NEGATIVE 07/14/2021 0732   LEUKOCYTESUR NEGATIVE 07/14/2021 0732    Sepsis Labs: Lactic  Acid, Venous    Component Value Date/Time   LATICACIDVEN 1.9 10/16/2021 1118    MICROBIOLOGY: Recent Results (from the past 240 hour(s))  SARS Coronavirus 2 by RT PCR (hospital order, performed in [facility] hospital lab) *cepheid single result test* Anterior Nasal Swab     Status: None   Collection Time: 10/14/21  5:54 AM   Specimen: Anterior Nasal Swab  Result Value Ref Range Status   SARS Coronavirus 2 by RT PCR NEGATIVE NEGATIVE Final    Comment: (NOTE) SARS-CoV-2 target nucleic acids are NOT DETECTED.  The SARS-CoV-2 RNA is generally detectable in upper and lower respiratory specimens during the acute phase of infection. The lowest concentration of SARS-CoV-2 viral copies this assay can detect is 250 copies / mL. A negative result does not preclude SARS-CoV-2 infection and should not be used as the sole basis for treatment or other patient management decisions.  A negative result may occur with improper specimen collection / handling, submission of specimen other than nasopharyngeal swab, presence of viral mutation(s) within the areas targeted by this assay, and inadequate number of viral copies (<250 copies / mL). A negative result must be combined with clinical observations,  patient history, and epidemiological information.  Fact Sheet for Patients:   https://www.patel.info/  Fact Sheet for Healthcare Providers: https://hall.com/  This test is not yet approved or  cleared by the [place] FDA and has been authorized for detection and/or diagnosis of SARS-CoV-2 by FDA under an Emergency Use Authorization (EUA).  This EUA will remain in effect (meaning this test can be used) for the duration of the COVID-19 declaration under Section 564(b)(1) of the Act, 21 U.S.C. section 360bbb-3(b)(1), unless the authorization is terminated or revoked sooner.  Performed at [facility], [place] [address] [place], [place] [address]   MRSA Next Gen by PCR, Nasal     Status: None   Collection Time: 10/14/21  4:06 PM   Specimen: Nasal Mucosa; Nasal Swab  Result Value Ref Range Status   MRSA by PCR Next Gen NOT DETECTED NOT DETECTED Final    Comment: (NOTE) The GeneXpert MRSA Assay (FDA approved for NASAL specimens only), is one component of a comprehensive MRSA colonization surveillance program. It is not intended to diagnose MRSA infection nor to guide or monitor treatment for MRSA infections. Test performance is not FDA approved in patients less than 4 years old. Performed at [facility], [place] [address] [place], [place] [address]   Culture, blood (Routine X 2) w Reflex to ID Panel     Status: None (Preliminary result)   Collection Time: 10/16/21 11:18 AM   Specimen: BLOOD  Result Value Ref Range Status   Specimen Description BLOOD SITE NOT SPECIFIED  Final   Special Requests   Final    BOTTLES DRAWN AEROBIC AND ANAEROBIC Blood Culture adequate volume   Culture   Final    NO GROWTH < 24 HOURS Performed at [facility], [place] [address] [place], [place] [address]    Report Status PENDING  Incomplete  Culture, blood (Routine X 2) w Reflex to ID Panel     Status: None (Preliminary result)   Collection  Time: 10/16/21 11:24 AM   Specimen: BLOOD  Result Value Ref Range Status   Specimen Description BLOOD RIGHT ANTECUBITAL  Final   Special Requests   Final    BOTTLES DRAWN AEROBIC AND ANAEROBIC Blood Culture adequate volume   Culture   Final  NO GROWTH < 24 HOURS Performed at [place] [address], [place], [place] [address]    Report Status PENDING  Incomplete    RADIOLOGY STUDIES/RESULTS: [place] EKG SITE RITE  Result Date: 10/16/2021 If Site Rite image not attached, placement could not be confirmed due to current cardiac rhythm.  DG Abd Portable 1V  Result Date: 10/16/2021 CLINICAL DATA:  NG tube placement EXAM: PORTABLE ABDOMEN - 1 VIEW COMPARISON:  10/15/2021 abdominal radiograph, 10/16/21 Chest xray FINDINGS: Compared to abdominal radiograph from 10/15/2021, the enteric tube has been retracted. Compared to chest radiograph dated 10/16/2021, the enteric tube has been advanced with the sidehole now likely in the proximal stomach. No supine evidence of pneumoperitoneum. Pelvic phleboliths and vascular calcifications are noted. No pneumatosis. No portal venous gas. No acute osseous abnormality. IMPRESSION: Compared to chest radiograph dated 10/16/2021, the enteric tube has been advanced with the sidehole now likely in the proximal stomach. Electronically Signed   By: Marin Roberts M.D.   On: 10/16/2021 10:11   DG CHEST PORT 1 VIEW  Result Date: 10/16/2021 CLINICAL DATA:  Fever, tachycardia EXAM: PORTABLE CHEST 1 VIEW COMPARISON:  Previous studies including the examination of 07/19/2021 FINDINGS: Tip of tracheostomy is 5.5 cm above the carina. Side port in the NG tube is seen in lower thoracic esophagus slightly above the gastroesophageal junction. Transverse diameter of heart is increased. There are linear densities in medial left lower lung field. There is blunting of left lateral CP angle. There are no signs of pulmonary edema. There is no pneumothorax. Skin staples are seen in  the neck. IMPRESSION: Cardiomegaly. Linear densities in left lower lung fields suggest subsegmental atelectasis. Small left pleural effusion. Side-port in the enteric tube is seen in lower thoracic esophagus slightly above the gastroesophageal junction. Enteric tube should be advanced 10 cm to place the side port within the stomach. These results will be called to the ordering clinician or representative by the Radiologist Assistant, and communication documented in the PACS or [employer]. Electronically Signed   By: Elmer Picker M.D.   On: 10/16/2021 09:12     LOS: 3 days   Oren Binet, MD  [facility]    To contact the attending provider between 7A-7P or the covering provider during after hours 7P-7A, please log into the web site [email or web address] and access using universal [place] password for that web site. If you do not have the password, please call the hospital operator.  10/17/2021, 10:18 AM

## 2021-10-17 NOTE — Evaluation (Signed)
[facility] Speaking Valve - Evaluation Patient Details  Name: Nathaniel Hicks MRN: [id number] Date of Birth: 12-03-1940  Today's Date: 10/17/2021 Time: 1000-1020 SLP Time Calculation (min) (ACUTE ONLY): 20 min  Past Medical History:  Past Medical History:  Diagnosis Date   Arthritis    BPH (benign prostatic hyperplasia)    Difficult intubation    HOH (hard of hearing)    Hyperlipidemia 11/02/2017   Hypertension    Hypertension 11/02/2017   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[place])    2014   Vitamin D deficiency 11/02/2017   Past Surgical History:  Past Surgical History:  Procedure Laterality Date   COLONOSCOPY N/A 11/24/2012   Procedure: COLONOSCOPY;  Surgeon: Rogene Houston, MD;  [facility];  Service: Endoscopy;  Laterality: N/A;  830-moved to [place] notified pt   FLOOR OF MOUTH BIOPSY N/A 10/14/2021   Procedure: FLOOR OF MOUTH RESECTION;  Surgeon: Melida Quitter, MD;  Location: [place];  Service: ENT;  Laterality: N/A;   HEMORROIDECTOMY     KNEE ARTHROSCOPY WITH LATERAL MENISECTOMY Right 08/31/2017   Procedure: KNEE ARTHROSCOPY WITH LATERAL MENISECTOMY;  Surgeon: Carole Civil, MD;  Location: [facility];  Service: Orthopedics;  Laterality: Right;   LESION EXCISION N/A 03/23/2012   Procedure: EXCISION NEOPLASM SCALP ;  Surgeon: Jamesetta So, MD;  Location: [facility];  Service: General;  Laterality: N/A;  Excision of Scalp Neoplasm   ORCHIECTOMY Right 12/20/2012   Procedure: RIGHT RADICAL ORCHIECTOMY/POSSIBLE BX RIGHT TESTICLE;  Surgeon: Marissa Nestle, MD;  Location: [facility];  Service: Urology;  Laterality: Right;   PROSTATE SURGERY     RADICAL NECK DISSECTION Bilateral 10/14/2021   Procedure: NECK DISSECTION;  Surgeon: Melida Quitter, MD;  Location: [facility];  Service: ENT;  Laterality: Bilateral;   SCALP LACERATION REPAIR     [facility]-Dr Tamala Julian   SKIN FULL THICKNESS GRAFT Bilateral 10/14/2021   Procedure: PLATYSMA FLAP CLOSURE;  Surgeon: Melida Quitter, MD;  Location: [facility];  Service: ENT;   Laterality: Bilateral;   TOOTH EXTRACTION  10/14/2021   Procedure: DENTAL EXTRACTIONS;  Surgeon: Melida Quitter, MD;  Location: [place] [place];  Service: ENT;;   TRACHEOSTOMY TUBE PLACEMENT N/A 10/14/2021   Procedure: TRACHEOSTOMY;  Surgeon: Melida Quitter, MD;  Location: [place];  Service: ENT;  Laterality: N/A;   HPI:  Patient is a 81 y.o.  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 On October 3, pt underwent floor of mouth resection, 5 x 5 cm, Tracheostomy, Platysma flap closure, dental extractions #[id number], bilateral selective neck dissections zones 1-3. Subsequently admitted to the ICU for close monitoring-postoperative course complicated by nausea/vomiting requiring NG tube insertion.    Assessment / Plan / Recommendation  Clinical Impression  Pt demonstrates excellent tolerance of PMSV. Cuff already deflated at baseline, no secretions observed. PMSV placed for 1 minute, 5 minute and 15 minute trials. After the first 5 minute trial pt did agree to a sensation of pressure, but seemed to become more accostomed to sensation after a time. No back pressure observed with removal and vital signs stable throughout. Pts phonation a little dysphonic, articulation moderately impaired due to decreased lingual ROM, but pt still about 75% intelligible and breath support and volume very good. Questioned if pt had a baseline fluency disorder as he seemed to have some hesitations and repetitions. Pt confirmed but specifics are not clear. Able to communicate wants and needs with valve in place. Recommend pt  wear PMSV with full staff supervision. Will f/u for needs, per ENT pt not ready for swallowing assessment/oral intake yet. SLP Visit Diagnosis: Aphonia (R49.1)    SLP Assessment  Patient needs continued Speech [facility]    Recommendations for follow up therapy are one component of a multi-disciplinary discharge planning process, led  by the attending physician.  Recommendations may be updated based on patient status, additional functional criteria and insurance authorization.  Follow Up Recommendations       Assistance Recommended at Discharge    Functional Status Assessment    Frequency and Duration min 2x/week  2 weeks    PMSV Trial PMSV was placed for: 15 minutes Able to redirect subglottic air through upper airway: Yes Able to Attain Phonation: Yes Voice Quality: Other (comment) (a little harsh) Able to Expectorate Secretions: No attempts Breath Support for Phonation: Adequate Intelligibility: Intelligibility reduced Word: 75-100% accurate Phrase: 50-74% accurate Sentence: 50-74% accurate Conversation: 50-74% accurate Respirations During Trial: 16 SpO2 During Trial: 96 % Pulse During Trial: 85 Behavior: Alert;Cooperative   Tracheostomy Tube  Additional Tracheostomy Tube Assessment Trach Collar Period: all waking hours    Vent Dependency  FiO2 (%): 28 %    Cuff Deflation Trial Tolerated Cuff Deflation: Yes Length of Time for Cuff Deflation Trial: baseline Behavior: Alert;Cooperative         Jossiah Smoak, Katherene Ponto 10/17/2021, 1:23 PM

## 2021-10-17 NOTE — Progress Notes (Signed)
Subjective: No specific complaints.  Objective: Vital signs in last 24 hours: Temp:  [98.6 F (37 C)-99.9 F (37.7 C)] 98.6 F (37 C) (10/06 1100) Pulse Rate:  [74-118] 87 (10/06 1100) Resp:  [14-28] 15 (10/06 1100) BP: (115-179)/(59-100) 147/78 (10/06 1100) SpO2:  [92 %-98 %] 94 % (10/06 1100) FiO2 (%):  [28 %] 28 % (10/06 0809) Weight:  [78.9 kg] 78.9 kg (10/06 0500) Weight change:  Last BM Date :  (pta)  Intake/Output from previous day: 10/05 0701 - 10/06 0700 In: 1868.4 [I.V.:1468.3; IV Piggyback:400.1] Out: 1340 [Urine:600; Emesis/NG output:700; Drains:40] Intake/Output this shift: Total I/O In: -  Out: 100 [Urine:100]  PHYSICAL EXAM: He is awake and alert.  His breathing is nice and clear.  Tracheostomy is well seated.  With Passy-Muir valve in place he is able to phonate very nicely.  Neck looks excellent.  Drains are functioning.  Floor of mouth repair, skin paddle is dark.  Do not know the status of the muscle part of the flap although there is not any breakdown or dehiscence, and no signs of infection.  Lab Results: Recent Labs    10/16/21 0711 10/16/21 1434 10/17/21 0631  WBC 15.5*  --  14.3*  HGB 13.1 12.7* 12.0*  HCT 39.8 39.3 36.6*  PLT 229  --  201   BMET Recent Labs    10/16/21 0711 10/17/21 0631  NA 137 141  K 4.0 4.1  CL 99 107  CO2 29 24  GLUCOSE 159* 109*  BUN 7* 12  CREATININE 0.77 0.80  CALCIUM 9.4 9.3    Studies/Results: [place] EKG SITE RITE  Result Date: 10/16/2021 If Site Rite image not attached, placement could not be confirmed due to current cardiac rhythm.  DG Abd Portable 1V  Result Date: 10/16/2021 CLINICAL DATA:  NG tube placement EXAM: PORTABLE ABDOMEN - 1 VIEW COMPARISON:  10/15/2021 abdominal radiograph, 10/16/21 Chest xray FINDINGS: Compared to abdominal radiograph from 10/15/2021, the enteric tube has been retracted. Compared to chest radiograph dated 10/16/2021, the enteric tube has been advanced with the sidehole now  likely in the proximal stomach. No supine evidence of pneumoperitoneum. Pelvic phleboliths and vascular calcifications are noted. No pneumatosis. No portal venous gas. No acute osseous abnormality. IMPRESSION: Compared to chest radiograph dated 10/16/2021, the enteric tube has been advanced with the sidehole now likely in the proximal stomach. Electronically Signed   By: Marin Roberts M.D.   On: 10/16/2021 10:11   DG CHEST PORT 1 VIEW  Result Date: 10/16/2021 CLINICAL DATA:  Fever, tachycardia EXAM: PORTABLE CHEST 1 VIEW COMPARISON:  Previous studies including the examination of 07/19/2021 FINDINGS: Tip of tracheostomy is 5.5 cm above the carina. Side port in the NG tube is seen in lower thoracic esophagus slightly above the gastroesophageal junction. Transverse diameter of heart is increased. There are linear densities in medial left lower lung field. There is blunting of left lateral CP angle. There are no signs of pulmonary edema. There is no pneumothorax. Skin staples are seen in the neck. IMPRESSION: Cardiomegaly. Linear densities in left lower lung fields suggest subsegmental atelectasis. Small left pleural effusion. Side-port in the enteric tube is seen in lower thoracic esophagus slightly above the gastroesophageal junction. Enteric tube should be advanced 10 cm to place the side port within the stomach. These results will be called to the ordering clinician or representative by the Radiologist Assistant, and communication documented in the PACS or [employer]. Electronically Signed   By: Prudy Feeler.D.  On: 10/16/2021 09:12    Medications: I have reviewed the patient's current medications.  Assessment/Plan: Stable postop day 3.  Plan:  1.  He pulled his feeding tube out last night.  Nursing staff attempted to replace it today but he did not cooperate.  We will try again later today.  If were not able to replace the nasogastric feeding tube we may need to consider a PEG tube  temporarily.  2.  Continue to monitor the flap for viability.  At this point there is no signs of infection.  There is no signs of any wound breakdown or leakage.  3.  Continue with tracheostomy.  We will change to a cuffless tube on Sunday.  4.  Continue to monitor drainage output.  Remove drains when appropriate.  LOS: 3 days      Nathaniel Hicks 10/17/2021, 11:52 AM

## 2021-10-18 ENCOUNTER — Inpatient Hospital Stay (HOSPITAL_COMMUNITY): Payer: Medicare Other

## 2021-10-18 DIAGNOSIS — C069 Malignant neoplasm of mouth, unspecified: Secondary | ICD-10-CM | POA: Diagnosis not present

## 2021-10-18 LAB — CBC WITH DIFFERENTIAL/PLATELET
Abs Immature Granulocytes: 0.04 10*3/uL (ref 0.00–0.07)
Basophils Absolute: 0 10*3/uL (ref 0.0–0.1)
Basophils Relative: 0 %
Eosinophils Absolute: 0.1 10*3/uL (ref 0.0–0.5)
Eosinophils Relative: 1 %
HCT: 37 % — ABNORMAL LOW (ref 39.0–52.0)
Hemoglobin: 11.5 g/dL — ABNORMAL LOW (ref 13.0–17.0)
Immature Granulocytes: 0 %
Lymphocytes Relative: 9 %
Lymphs Abs: 1 10*3/uL (ref 0.7–4.0)
MCH: 32.3 pg (ref 26.0–34.0)
MCHC: 31.1 g/dL (ref 30.0–36.0)
MCV: 103.9 fL — ABNORMAL HIGH (ref 80.0–100.0)
Monocytes Absolute: 1 10*3/uL (ref 0.1–1.0)
Monocytes Relative: 10 %
Neutro Abs: 8.2 10*3/uL — ABNORMAL HIGH (ref 1.7–7.7)
Neutrophils Relative %: 80 %
Platelets: 233 10*3/uL (ref 150–400)
RBC: 3.56 MIL/uL — ABNORMAL LOW (ref 4.22–5.81)
RDW: 13.4 % (ref 11.5–15.5)
WBC: 10.3 10*3/uL (ref 4.0–10.5)
nRBC: 0 % (ref 0.0–0.2)

## 2021-10-18 LAB — BASIC METABOLIC PANEL
Anion gap: 8 (ref 5–15)
BUN: 7 mg/dL — ABNORMAL LOW (ref 8–23)
CO2: 29 mmol/L (ref 22–32)
Calcium: 9.6 mg/dL (ref 8.9–10.3)
Chloride: 105 mmol/L (ref 98–111)
Creatinine, Ser: 0.81 mg/dL (ref 0.61–1.24)
GFR, Estimated: >60 mL/min (ref >60)
Glucose, Bld: 125 mg/dL — ABNORMAL HIGH (ref 70–99)
Potassium: 4.2 mmol/L (ref 3.5–5.1)
Sodium: 142 mmol/L (ref 135–145)

## 2021-10-18 LAB — GLUCOSE, CAPILLARY
Glucose-Capillary: 123 mg/dL — ABNORMAL HIGH (ref 70–99)
Glucose-Capillary: 125 mg/dL — ABNORMAL HIGH (ref 70–99)
Glucose-Capillary: 108 mg/dL — ABNORMAL HIGH (ref 70–99)
Glucose-Capillary: 123 mg/dL — ABNORMAL HIGH (ref 70–99)
Glucose-Capillary: 157 mg/dL — ABNORMAL HIGH (ref 70–99)
Glucose-Capillary: 143 mg/dL — ABNORMAL HIGH (ref 70–99)

## 2021-10-18 LAB — BRAIN NATRIURETIC PEPTIDE: B Natriuretic Peptide: 201.3 pg/mL — ABNORMAL HIGH (ref 0.0–100.0)

## 2021-10-18 LAB — PROCALCITONIN: Procalcitonin: <0.1 ng/mL

## 2021-10-18 LAB — MAGNESIUM: Magnesium: 2.1 mg/dL (ref 1.7–2.4)

## 2021-10-18 LAB — PHOSPHORUS: Phosphorus: 2.5 mg/dL (ref 2.5–4.6)

## 2021-10-18 MED ORDER — BISACODYL 10 MG RE SUPP: 10.0000 mg | Freq: Every day | RECTAL | Status: AC

## 2021-10-18 MED ORDER — MAGNESIUM HYDROXIDE 400 MG/5ML PO SUSP
30.0000 mL | Freq: Two times a day (BID) | ORAL | Status: AC
  Administered 2021-10-18 – 2021-10-19 (×3): 30 mL
  Filled 2021-10-18 (×3): qty 30

## 2021-10-18 MED ORDER — HYDRALAZINE HCL 20 MG/ML IJ SOLN
10.0000 mg | Freq: Four times a day (QID) | INTRAMUSCULAR | Status: DC | PRN
  Administered 2021-10-19: 10 mg via INTRAVENOUS
  Filled 2021-10-18: qty 1

## 2021-10-18 MED ORDER — HYDRALAZINE HCL 50 MG PO TABS
50.0000 mg | ORAL_TABLET | Freq: Three times a day (TID) | ORAL | Status: DC
  Administered 2021-10-18 (×2): 50 mg
  Filled 2021-10-18 (×2): qty 1

## 2021-10-18 MED ORDER — BISACODYL 10 MG RE SUPP: 10.0000 mg | Freq: Every day | RECTAL | Status: DC

## 2021-10-18 MED ORDER — FUROSEMIDE 40 MG PO TABS
40.0000 mg | ORAL_TABLET | Freq: Once | ORAL | Status: AC
  Administered 2021-10-18: 40 mg
  Filled 2021-10-18: qty 1

## 2021-10-18 MED ORDER — SCOPOLAMINE 1 MG/3DAYS TD PT72
1.0000 | MEDICATED_PATCH | TRANSDERMAL | Status: AC
  Administered 2021-10-18: 1.5 mg via TRANSDERMAL
  Filled 2021-10-18: qty 1

## 2021-10-18 MED ORDER — SCOPOLAMINE 1 MG/3DAYS TD PT72
1.0000 | MEDICATED_PATCH | TRANSDERMAL | Status: DC
  Filled 2021-10-18: qty 1

## 2021-10-18 MED ORDER — POLYETHYLENE GLYCOL 3350 17 G PO PACK
17.0000 g | PACK | Freq: Two times a day (BID) | ORAL | Status: DC
  Administered 2021-10-18 – 2021-10-22 (×7): 17 g
  Filled 2021-10-18 (×8): qty 1

## 2021-10-18 MED ORDER — NALOXEGOL OXALATE 12.5 MG PO TABS: 12.5000 mg | ORAL_TABLET | Freq: Every day | ORAL | Status: DC

## 2021-10-18 MED ORDER — AMLODIPINE BESYLATE 10 MG PO TABS
10.0000 mg | ORAL_TABLET | Freq: Every day | ORAL | Status: DC
  Administered 2021-10-18 – 2021-10-22 (×5): 10 mg
  Filled 2021-10-18 (×5): qty 1

## 2021-10-18 NOTE — Progress Notes (Signed)
PROGRESS NOTE        PATIENT DETAILS Name: Nathaniel Hicks Age: 81 y.o. Sex: male Date of Birth: 1940-05-31 Admit Date: 10/14/2021 Admitting Physician Melida Quitter, MD YBW:LSLH, Edwinna Areola, MD  Brief Summary: Patient is a 81 y.o.  male with history of head/Cancer (floor of the mouth)-brought to the hospital by ENT for floor of mouth resection, tracheotomy, neck dissection-subsequently admitted to the ICU for close monitoring-postoperative course complicated by nausea/vomiting requiring NG tube insertion.  Hospitalist service was consulted for management of possible aspiration pneumonia.  Significant microbiology data: 10/3>> COVID PCR: Negative 10/5>> blood culture: Negative   Subjective: Patient in bed denies any headache chest or abdominal pain, does have some cough, no shortness of breath  Objective: Vitals: Blood pressure (!) 152/66, pulse 75, temperature 99.1 F (37.3 C), temperature source Oral, resp. rate 16, height '5\' 11"'$  (1.803 m), weight 78.4 kg, SpO2 94 %.   Exam:  Awake Alert, No new F.N deficits, Normal affect [name].AT,PERRAL Supple Neck, trach site with copious secretions, trach collar in place, NG tube in place Symmetrical Chest wall movement, Good air movement bilaterally, few crackles RRR,No Gallops, Rubs or new Murmurs,  +ve B.Sounds, Abd Soft, No tenderness,   No Cyanosis, Clubbing or edema    Assessment/Plan: Aspiration pneumonia SIRS Improved-afebrile overnight-continue Unasyn, Follow cultures Does have copious secretions and has some crackles on exam as well, stop IV fluids, gentle Lasix x1 on 73/04/2874, 1 application of scopolamine patch for 3 days, trach hygiene per RT and primary team which is ENT.  Monitor closely.   Nausea/vomiting Unclear etiology-could have been postoperative ileus. Currently resolved we will closely monitor.  HTN BP on the higher side but relatively stable All oral medications on hold as patient  n.p.o. We will use IV hydralazine as needed for now.  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 Redmond Baseman on 10/6-not ready for oral intake yet If Patient agrees-we will replace NG tube and start NG tube feedings Defer further to ENT  Nutrition Status: Nutrition Problem: Moderate Malnutrition Etiology: chronic illness (floor of mouth cancer) Signs/Symptoms: mild fat depletion, moderate muscle depletion Interventions: Tube feeding, Refer to RD note for recommendations    BMI: Estimated body mass index is 24.11 kg/m as calculated from the following:   Height as of this encounter: '5\' 11"'$  (1.803 m).   Weight as of this encounter: 78.4 kg.   Code status:   Code Status: Full Code   DVT Prophylaxis: Place and maintain sequential compression device Start: 10/16/21 1321 SCDs Start: 10/14/21 1520   Family Communication:  None at bedside  Disposition Plan: Defer to primary service  Diet: Diet Order             Diet NPO time specified  Diet effective now                    MEDICATIONS: Scheduled Meds:  alum & mag hydroxide-simeth  30 mL Oral Once   amLODipine  10 mg Per Tube Daily   bacitracin  1 Application Topical O1L   bisacodyl  10 mg Rectal Q0600   Chlorhexidine Gluconate Cloth  6 each Topical Q0600   feeding supplement (PROSource TF20)  60 mL Per Tube BID   furosemide  40 mg Per  Tube Once   magnesium hydroxide  30 mL Per Tube BID   metoCLOPramide (REGLAN) injection  10 mg Intravenous Q6H   mouth rinse  15 mL Mouth Rinse 4 times per day   pantoprazole (PROTONIX) IV  40 mg Intravenous Q12H   polyethylene glycol  17 g Per Tube BID   scopolamine  1 patch Transdermal Q72H   Continuous Infusions:  sodium chloride 10 mL/hr at 10/18/21 0700   ampicillin-sulbactam (UNASYN) IV Stopped (10/18/21 0653)   feeding supplement (OSMOLITE 1.5 CAL) 40 mL/hr at 10/18/21  0700   PRN Meds:.sodium chloride, hydrALAZINE, HYDROcodone-acetaminophen, labetalol, morphine injection, ondansetron (ZOFRAN) IV, mouth rinse   I have personally reviewed following labs and imaging studies  LABORATORY DATA:  Recent Labs  Lab 10/14/21 0602 10/16/21 0711 10/16/21 1434 10/17/21 0631 10/18/21 0328  WBC 4.7 15.5*  --  14.3* 10.3  HGB 14.0 13.1 12.7* 12.0* 11.5*  HCT 42.7 39.8 39.3 36.6* 37.0*  PLT 243 229  --  201 233  MCV 101.7* 100.5*  --  101.7* 103.9*  MCH 33.3 33.1  --  33.3 32.3  MCHC 32.8 32.9  --  32.8 31.1  RDW 13.9 13.7  --  13.5 13.4  LYMPHSABS  --   --   --   --  1.0  MONOABS  --   --   --   --  1.0  EOSABS  --   --   --   --  0.1  BASOSABS  --   --   --   --  0.0    Recent Labs  Lab 10/14/21 0602 10/16/21 0711 10/16/21 1118 10/16/21 1434 10/17/21 0631 10/18/21 0328  NA 140 137  --   --  141 142  K 4.1 4.0  --   --  4.1 4.2  CL 104 99  --   --  107 105  CO2 26 29  --   --  24 29  GLUCOSE 103* 159*  --   --  109* 125*  BUN 10 7*  --   --  12 7*  CREATININE 0.94 0.77  --   --  0.80 0.81  CALCIUM 10.5* 9.4  --   --  9.3 9.6  AST  --   --  16  --   --   --   ALT  --   --  9  --   --   --   ALKPHOS  --   --  41  --   --   --   BILITOT  --   --  0.9  --   --   --   ALBUMIN  --   --  3.2*  --   --   --   MG  --   --   --   --  2.1 2.1  PHOS  --   --   --   --  2.3* 2.5  PROCALCITON  --   --  0.17  --   --  <0.10  LATICACIDVEN  --   --  1.9  --   --   --   BNP  --   --   --  231.9*  --  201.3*     RADIOLOGY STUDIES/RESULTS: DG Abd 1 View  Result Date: 10/17/2021 CLINICAL DATA:  Orogastric tube placement EXAM: ABDOMEN - 1 VIEW COMPARISON:  Portable exam 1404 hours compared to 10/16/2021 FINDINGS: Orogastric tube traverses stomach and pylorus with tip projecting over  the distal second portion of the duodenum; if placement of the tube tip within the stomach is desired, recommend withdrawal 13 cm. Visualized bowel gas pattern normal.  Atelectasis at LEFT lung base. No acute osseous findings. IMPRESSION: Tip of orogastric tube projects over distal second portion of duodenum; consider withdrawing tube 13 cm if placement of tube tip within the stomach is desired. Electronically Signed   By: Lavonia Dana M.D.   On: 10/17/2021 14:13   [place] EKG SITE RITE  Result Date: 10/16/2021 If Site Rite image not attached, placement could not be confirmed due to current cardiac rhythm.    LOS: 4 days   Signature  Lala Lund M.D on 10/18/2021 at 10:32 AM   -  To page go to [email or web address]

## 2021-10-18 NOTE — Evaluation (Signed)
Occupational Therapy Evaluation Patient Details Name: Nathaniel Hicks MRN: [id number] DOB: Feb 05, 1940 Today's Date: 10/18/2021   History of Present Illness 81 y.o. male admitted on 10/14/21 for surgical management of mouth CA.  Pt s/p floor of mouth resection, neck dissection bil, platysma flap clousre, trach and dental extractions.  Pt with significant PMH of HOH, HTN, testicular CA, R knee arthroscopy with lateral minesectomy.   Clinical Impression   PTA, pt was living alone and independent; sister and brother check in regularly and sister provide rides to grocery store.  Pt currently requiring Min A for LB ADLs and Min Guard-Min A for functional mobility with RW. Pt presenting with decreased balance, strength, and activity tolerance. Despite fatigue, pt very eager to perform OOB activity. Pt would benefit from further acute OT to facilitate safe dc. Recommend dc to home with AIR for further OT to optimize safety, independence with ADLs, and return to PLOF.      Recommendations for follow up therapy are one component of a multi-disciplinary discharge planning process, led by the attending physician.  Recommendations may be updated based on patient status, additional functional criteria and insurance authorization.   Follow Up Recommendations  Acute inpatient rehab (3hours/day)    Assistance Recommended at Discharge Frequent or constant Supervision/Assistance  Patient can return home with the following Assistance with cooking/housework    Functional Status Assessment  Patient has had a recent decline in their functional status and demonstrates the ability to make significant improvements in function in a reasonable and predictable amount of time.  Equipment Recommendations  Tub/shower seat    Recommendations for Other Services PT consult     Precautions / Restrictions Precautions Precautions: Fall Restrictions Weight Bearing Restrictions: No      Mobility Bed Mobility Overal bed  mobility: Needs Assistance Bed Mobility: Supine to Sit     Supine to sit: Min guard, HOB elevated     General bed mobility comments: Min Guard A for safety    Transfers Overall transfer level: Needs assistance Equipment used: Rolling walker (2 wheels) Transfers: Sit to/from Stand, Bed to chair/wheelchair/BSC Sit to Stand: Min guard Stand pivot transfers: Min assist         General transfer comment: Pt requiring Min Guard A for safety in sit<>stand. Min A for balance duirng pivot to recliner. Pt performing two additional sit<>stands from recliner wiht RW and requiring MIn guard A for safety      Balance Overall balance assessment: Needs assistance Sitting-balance support: No upper extremity supported, Feet supported Sitting balance-Leahy Scale: Fair     Standing balance support: Bilateral upper extremity supported, During functional activity, No upper extremity supported Standing balance-Leahy Scale: Poor Standing balance comment: reliant on UE support                           ADL either performed or assessed with clinical judgement   ADL Overall ADL's : Needs assistance/impaired Eating/Feeding: Set up;Sitting   Grooming: Wash/dry face;Supervision/safety;Set up;Sitting   Upper Body Bathing: Supervision/ safety;Set up;Sitting   Lower Body Bathing: Min guard;Sit to/from stand   Upper Body Dressing : Supervision/safety;Set up;Sitting   Lower Body Dressing: Minimal assistance;Sit to/from stand   Toilet Transfer: Minimal assistance;Stand-pivot;Rolling walker (2 wheels)           Functional mobility during ADLs: Minimal assistance;Rolling walker (2 wheels) General ADL Comments: Pt presenting with decreased balance and strength. Despite fatigue, pt very motivated to participate in therapy  Vision         Perception     Praxis      Pertinent Vitals/Pain Pain Assessment Pain Assessment: Faces Faces Pain Scale: Hurts a little bit Pain  Location: "my head feels tight" Pain Descriptors / Indicators: Discomfort Pain Intervention(s): Monitored during session, Repositioned     Hand Dominance Right   Extremity/Trunk Assessment Upper Extremity Assessment Upper Extremity Assessment: Generalized weakness   Lower Extremity Assessment Lower Extremity Assessment: Defer to PT evaluation   Cervical / Trunk Assessment Cervical / Trunk Assessment: Normal   Communication Communication Communication: HOH   Cognition Arousal/Alertness: Awake/alert Behavior During Therapy: WFL for tasks assessed/performed Overall Cognitive Status: Impaired/Different from baseline Area of Impairment: Problem solving                             Problem Solving: Slow processing General Comments: Benefiting from increased time     General Comments  VSS on trach    Exercises Exercises: Other exercises Other Exercises Other Exercises: Marching in place in standing;x2; 10 steps each   Shoulder Instructions      Home Living Family/patient expects to be discharged to:: Private residence Living Arrangements: Alone Available Help at Discharge: Family Type of Home: Apartment (second level) Home Access: Elevator;Level entry     Home Layout: One level     Bathroom Shower/Tub: Teacher, early years/pre: Standard     Home Equipment: Conservation officer, nature (2 wheels);Cane - single point   Additional Comments: Sister and brother can come every day per patient      Prior Functioning/Environment Prior Level of Function : Independent/Modified Independent             Mobility Comments: sing cane/RW as needed; but not all the time ADLs Comments: ADLs and IADLs. Sister driving him to the store        OT Problem List: Decreased strength;Decreased range of motion;Decreased activity tolerance;Impaired balance (sitting and/or standing);Decreased knowledge of use of DME or AE;Decreased knowledge of precautions      OT  Treatment/Interventions: Self-care/ADL training;Therapeutic exercise;Energy conservation;DME and/or AE instruction;Therapeutic activities;Patient/family education    OT Goals(Current goals can be found in the care plan section) Acute Rehab OT Goals Patient Stated Goal: Go home OT Goal Formulation: With patient Time For Goal Achievement: 11/01/21 Potential to Achieve Goals: Good  OT Frequency: Min 3X/week    Co-evaluation              AM-PAC OT "6 Clicks" Daily Activity     Outcome Measure Help from another person eating meals?: A Little Help from another person taking care of personal grooming?: A Little Help from another person toileting, which includes using toliet, bedpan, or urinal?: A Little Help from another person bathing (including washing, rinsing, drying)?: A Little Help from another person to put on and taking off regular upper body clothing?: A Little Help from another person to put on and taking off regular lower body clothing?: A Little 6 Click Score: 18   End of Session Equipment Utilized During Treatment: Rolling walker (2 wheels);Oxygen Nurse Communication: Mobility status  Activity Tolerance: Patient tolerated treatment well Patient left: in chair;with call bell/phone within reach;with chair alarm set  OT Visit Diagnosis: Unsteadiness on feet (R26.81);Other abnormalities of gait and mobility (R26.89);Muscle weakness (generalized) (M62.81)                Time: 4034-7425 OT Time Calculation (min): 30 min Charges:  OT  General Charges $OT Visit: 1 Visit OT Evaluation $OT Eval Moderate Complexity: 1 Mod OT Treatments $Self Care/Home Management : 8-22 mins  Yuliet Needs MSOT, OTR/L Acute Rehab Office: [place] 10/18/2021, 11:28 AM

## 2021-10-18 NOTE — Progress Notes (Signed)
Subjective: No new complaints.  Objective: Vital signs in last 24 hours: Temp:  [98.3 F (36.8 C)-99.8 F (37.7 C)] 99.1 F (37.3 C) (10/07 0735) Pulse Rate:  [75-124] 75 (10/07 0836) Resp:  [12-31] 16 (10/07 0836) BP: (130-177)/(55-108) 152/66 (10/07 0800) SpO2:  [92 %-98 %] 94 % (10/07 0836) FiO2 (%):  [28 %] 28 % (10/07 0836) Weight:  [78.4 kg] 78.4 kg (10/07 0500) Weight change: -0.5 kg Last BM Date :  (pta)  Intake/Output from previous day: 10/06 0701 - 10/07 0700 In: 2383 [I.V.:1297.6; NG/GT:428.3; IV Piggyback:657.1] Out: 810 [Urine:800; Drains:10] Intake/Output this shift: No intake/output data recorded.  PHYSICAL EXAM: Awake and alert.  Breathing well.  Copious secretions from tracheostomy.  Both drains are putting out minimal today so were removed.  No appreciable swelling of the neck.  The floor of mouth flap is still dark but intact.  No signs of infection or leak.  Lab Results: Recent Labs    10/17/21 0631 10/18/21 0328  WBC 14.3* 10.3  HGB 12.0* 11.5*  HCT 36.6* 37.0*  PLT 201 233   BMET Recent Labs    10/17/21 0631 10/18/21 0328  NA 141 142  K 4.1 4.2  CL 107 105  CO2 24 29  GLUCOSE 109* 125*  BUN 12 7*  CREATININE 0.80 0.81  CALCIUM 9.3 9.6    Studies/Results: DG Abd 1 View  Result Date: 10/17/2021 CLINICAL DATA:  Orogastric tube placement EXAM: ABDOMEN - 1 VIEW COMPARISON:  Portable exam 1404 hours compared to 10/16/2021 FINDINGS: Orogastric tube traverses stomach and pylorus with tip projecting over the distal second portion of the duodenum; if placement of the tube tip within the stomach is desired, recommend withdrawal 13 cm. Visualized bowel gas pattern normal. Atelectasis at LEFT lung base. No acute osseous findings. IMPRESSION: Tip of orogastric tube projects over distal second portion of duodenum; consider withdrawing tube 13 cm if placement of tube tip within the stomach is desired. Electronically Signed   By: Lavonia Dana M.D.   On:  10/17/2021 14:13   [place] EKG SITE RITE  Result Date: 10/16/2021 If Site Rite image not attached, placement could not be confirmed due to current cardiac rhythm.   Medications: I have reviewed the patient's current medications.  Assessment/Plan: Postop day 4, both drains removed.  We will change the tracheostomy tomorrow to an uncuffed tube.  Still has copious secretions.  White blood cell count has normalized.  He has a new nasogastric feeding tube in place.  Dietary service is managing the nutrition.  LOS: 4 days      Nathaniel Hicks 10/18/2021, 10:40 AM

## 2021-10-18 NOTE — Evaluation (Signed)
Physical Therapy Evaluation Patient Details Name: Nathaniel Hicks MRN: [id number] DOB: 02-11-1940 Today's Date: 10/18/2021  History of Present Illness  81 y.o. male admitted on 10/14/21 for surgical management of mouth CA.  Pt s/p floor of mouth resection, neck dissection bil, platysma flap clousre, trach and dental extractions.  Pt with significant PMH of HOH, HTN, testicular CA, R knee arthroscopy with lateral minesectomy.  Clinical Impression  Pt highly motivated to work with PT and did not take one rest break for his lap around the unit.  We followed with a chair for safety and VSS on 28% TC with O2 at 94% and HR 120s during gait.  RT provided suction just prior to gait and we left the PMV off to allow for smoother O2 exchange during gait, however, he could communicate in sitting with it donned.  Pt and family would likely benefit from intensive therapy and education in an inpatient rehab setting prior to returning home.       Recommendations for follow up therapy are one component of a multi-disciplinary discharge planning process, led by the attending physician.  Recommendations may be updated based on patient status, additional functional criteria and insurance authorization.  Follow Up Recommendations Acute inpatient rehab (3hours/day)      Assistance Recommended at Discharge Frequent or constant Supervision/Assistance  Patient can return home with the following  A lot of help with walking and/or transfers;A lot of help with bathing/dressing/bathroom;Assistance with cooking/housework;Direct supervision/assist for medications management;Direct supervision/assist for financial management;Assist for transportation;Help with stairs or ramp for entrance    Equipment Recommendations  None at this time, possibly need home O2.  Recommendations for Other Services  Rehab consult    Functional Status Assessment Patient has had a recent decline in their functional status and demonstrates the  ability to make significant improvements in function in a reasonable and predictable amount of time.     Precautions / Restrictions Precautions Precautions: Fall Precaution Comments: trach, feeding tube (NGT) Required Braces or Orthoses: Other Brace Other Brace: PMV during therapy Restrictions Weight Bearing Restrictions: No      Mobility  Bed Mobility Overal bed mobility: Needs Assistance (Pt OOB in the recliner chair.)                  Transfers Overall transfer level: Needs assistance Equipment used: Rolling walker (2 wheels) Transfers: Sit to/from Stand, Bed to chair/wheelchair/BSC Sit to Stand: Min assist           General transfer comment: Min assist for multiple sit to stands from recliner chair, cues for safe hand placement for transitions.    Ambulation/Gait Ambulation/Gait assistance: Min assist Gait Distance (Feet): 150 Feet Assistive device: Rolling walker (2 wheels) (chair to follow for safety) Gait Pattern/deviations: Step-through pattern Gait velocity: decreased Gait velocity interpretation: <1.31 ft/sec, indicative of household ambulator Pre-gait activities: standing marches with RW min assist in prep for gait General Gait Details: Cues for upright posture, staying closer to RW, chair to follow for safety, but not needed for the distance we walked.  O2 sats in the 90s throughout gait on 6L O2 and 28% TC  Stairs            Wheelchair Mobility    Modified Rankin (Stroke Patients Only)       Balance           Standing balance support: Bilateral upper extremity supported, During functional activity, No upper extremity supported Standing balance-Leahy Scale: Poor Standing balance comment: reliant on UE  support and min to min guard assist in standing.                             Pertinent Vitals/Pain Pain Assessment Pain Assessment: Faces Faces Pain Scale: Hurts little more Pain Descriptors / Indicators: Discomfort,  Grimacing, Guarding Pain Intervention(s): Limited activity within patient's tolerance, Monitored during session, Repositioned    Home Living Family/patient expects to be discharged to:: Private residence Living Arrangements: Alone Available Help at Discharge: Family Type of Home: Apartment (second level) Home Access: Elevator;Level entry       Home Layout: One level Home Equipment: Conservation officer, nature (2 wheels);Cane - single point Additional Comments: Sister and brother can come every day per patient    Prior Function Prior Level of Function : Independent/Modified Independent             Mobility Comments: sing cane/RW as needed; but not all the time ADLs Comments: ADLs and IADLs. Sister driving him to the store     Hand Dominance   Dominant Hand: Right    Extremity/Trunk Assessment   Upper Extremity Assessment Upper Extremity Assessment: Defer to OT evaluation    Lower Extremity Assessment Lower Extremity Assessment: Generalized weakness (Grossly 3/5 per seated MMT)    Cervical / Trunk Assessment Cervical / Trunk Assessment: Kyphotic;Other exceptions Cervical / Trunk Exceptions: forward head, guarded neck movement due to large incision.  Communication   Communication: HOH  Cognition Arousal/Alertness: Awake/alert Behavior During Therapy: WFL for tasks assessed/performed Overall Cognitive Status: Impaired/Different from baseline Area of Impairment: Problem solving                             Problem Solving: Slow processing General Comments: Benefiting from increased time        General Comments General comments (skin integrity, edema, etc.): VSS on RA with 28% TC, HR tachy 120s during gait.    Exercises Other Exercises Other Exercises: Marching in place in standing;x1 10 steps each Other Exercises: seated LAQ x 10, seated ankle pumps x10 each bil.   Assessment/Plan    PT Assessment Patient needs continued PT services  PT Problem List  Decreased strength;Decreased activity tolerance;Decreased balance;Decreased mobility;Decreased knowledge of use of DME;Cardiopulmonary status limiting activity;Decreased cognition;Pain       PT Treatment Interventions DME instruction;Gait training;Stair training;Functional mobility training;Therapeutic activities;Therapeutic exercise;Balance training;Patient/family education;Cognitive remediation    PT Goals (Current goals can be found in the Care Plan section)  Acute Rehab PT Goals Patient Stated Goal: to walk (get up off of his bottom), and ultimately go home PT Goal Formulation: With patient Time For Goal Achievement: 11/01/21 Potential to Achieve Goals: Good    Frequency Min 3X/week     Co-evaluation               AM-PAC PT "6 Clicks" Mobility  Outcome Measure Help needed turning from your back to your side while in a flat bed without using bedrails?: A Little Help needed moving from lying on your back to sitting on the side of a flat bed without using bedrails?: A Little Help needed moving to and from a bed to a chair (including a wheelchair)?: A Little Help needed standing up from a chair using your arms (e.g., wheelchair or bedside chair)?: A Little Help needed to walk in hospital room?: Total Help needed climbing 3-5 steps with a railing? : Total 6 Click Score: 14  End of Session Equipment Utilized During Treatment: Gait belt;Oxygen Activity Tolerance: Patient limited by fatigue Patient left: in chair;with call bell/phone within reach Nurse Communication: Mobility status PT Visit Diagnosis: Muscle weakness (generalized) (M62.81);Difficulty in walking, not elsewhere classified (R26.2);Pain Pain - part of body:  (neck)    Time: 4370-0525 PT Time Calculation (min) (ACUTE ONLY): 25 min   Charges:   PT Evaluation $PT Eval Moderate Complexity: 1 Mod PT Treatments $Gait Training: 8-22 mins        Verdene Lennert, PT, DPT  Acute Rehabilitation Secure chat is best  for contact #[phone] office     Barbarann Ehlers Jasnoor Trussell 10/18/2021, 12:32 PM

## 2021-10-19 DIAGNOSIS — C069 Malignant neoplasm of mouth, unspecified: Secondary | ICD-10-CM | POA: Diagnosis not present

## 2021-10-19 LAB — BASIC METABOLIC PANEL
Anion gap: 9 (ref 5–15)
BUN: 13 mg/dL (ref 8–23)
CO2: 25 mmol/L (ref 22–32)
Calcium: 9.6 mg/dL (ref 8.9–10.3)
Chloride: 106 mmol/L (ref 98–111)
Creatinine, Ser: 0.69 mg/dL (ref 0.61–1.24)
GFR, Estimated: >60 mL/min (ref >60)
Glucose, Bld: 128 mg/dL — ABNORMAL HIGH (ref 70–99)
Potassium: 4.7 mmol/L (ref 3.5–5.1)
Sodium: 140 mmol/L (ref 135–145)

## 2021-10-19 LAB — CBC WITH DIFFERENTIAL/PLATELET
Abs Immature Granulocytes: 0.05 10*3/uL (ref 0.00–0.07)
Basophils Absolute: 0.1 10*3/uL (ref 0.0–0.1)
Basophils Relative: 0 %
Eosinophils Absolute: 0.1 10*3/uL (ref 0.0–0.5)
Eosinophils Relative: 1 %
HCT: 37.8 % — ABNORMAL LOW (ref 39.0–52.0)
Hemoglobin: 12.3 g/dL — ABNORMAL LOW (ref 13.0–17.0)
Immature Granulocytes: 0 %
Lymphocytes Relative: 10 %
Lymphs Abs: 1.2 10*3/uL (ref 0.7–4.0)
MCH: 32.9 pg (ref 26.0–34.0)
MCHC: 32.5 g/dL (ref 30.0–36.0)
MCV: 101.1 fL — ABNORMAL HIGH (ref 80.0–100.0)
Monocytes Absolute: 1.2 10*3/uL — ABNORMAL HIGH (ref 0.1–1.0)
Monocytes Relative: 10 %
Neutro Abs: 9.1 10*3/uL — ABNORMAL HIGH (ref 1.7–7.7)
Neutrophils Relative %: 79 %
Platelets: 249 10*3/uL (ref 150–400)
RBC: 3.74 MIL/uL — ABNORMAL LOW (ref 4.22–5.81)
RDW: 13.4 % (ref 11.5–15.5)
WBC: 11.7 10*3/uL — ABNORMAL HIGH (ref 4.0–10.5)
nRBC: 0 % (ref 0.0–0.2)

## 2021-10-19 LAB — GLUCOSE, CAPILLARY
Glucose-Capillary: 128 mg/dL — ABNORMAL HIGH (ref 70–99)
Glucose-Capillary: 130 mg/dL — ABNORMAL HIGH (ref 70–99)
Glucose-Capillary: 140 mg/dL — ABNORMAL HIGH (ref 70–99)
Glucose-Capillary: 147 mg/dL — ABNORMAL HIGH (ref 70–99)
Glucose-Capillary: 147 mg/dL — ABNORMAL HIGH (ref 70–99)
Glucose-Capillary: 153 mg/dL — ABNORMAL HIGH (ref 70–99)

## 2021-10-19 LAB — PROCALCITONIN: Procalcitonin: <0.1 ng/mL

## 2021-10-19 LAB — BRAIN NATRIURETIC PEPTIDE: B Natriuretic Peptide: 143.4 pg/mL — ABNORMAL HIGH (ref 0.0–100.0)

## 2021-10-19 LAB — PHOSPHORUS: Phosphorus: 2.1 mg/dL — ABNORMAL LOW (ref 2.5–4.6)

## 2021-10-19 LAB — MAGNESIUM: Magnesium: 2.4 mg/dL (ref 1.7–2.4)

## 2021-10-19 MED ORDER — HYDRALAZINE HCL 50 MG PO TABS
100.0000 mg | ORAL_TABLET | Freq: Three times a day (TID) | ORAL | Status: DC
  Administered 2021-10-19 – 2021-10-23 (×12): 100 mg
  Filled 2021-10-19 (×13): qty 2

## 2021-10-19 MED ORDER — SODIUM PHOSPHATES 45 MMOLE/15ML IV SOLN
30.0000 mmol | Freq: Once | INTRAVENOUS | Status: AC
  Administered 2021-10-19: 30 mmol via INTRAVENOUS
  Filled 2021-10-19: qty 10

## 2021-10-19 MED ORDER — FUROSEMIDE 10 MG/ML IJ SOLN
40.0000 mg | Freq: Once | INTRAMUSCULAR | Status: AC
  Administered 2021-10-19: 40 mg via INTRAVENOUS
  Filled 2021-10-19: qty 4

## 2021-10-19 NOTE — Progress Notes (Signed)
PROGRESS NOTE        PATIENT DETAILS Name: Nathaniel Hicks Age: 81 y.o. Sex: male Date of Birth: February 27, 1940 Admit Date: 10/14/2021 Admitting Physician Melida Quitter, MD TIW:PYKD, Edwinna Areola, MD  Brief Summary: Patient is a 81 y.o.  male with history of head/Cancer (floor of the mouth)-brought to the hospital by ENT for floor of mouth resection, tracheotomy, neck dissection-subsequently admitted to the ICU for close monitoring-postoperative course complicated by nausea/vomiting requiring NG tube insertion.  Hospitalist service was consulted for management of possible aspiration pneumonia.  Significant microbiology data: 10/3>> COVID PCR: Negative 10/5>> blood culture: Negative   Subjective:  Patient in bed, appears comfortable, denies any headache, no fever, no chest pain or pressure, improved trach site secretions, no shortness of breath , no abdominal pain. No new focal weakness.  Objective: Vitals: Blood pressure (!) 157/75, pulse (!) 102, temperature 98.8 F (37.1 C), temperature source Oral, resp. rate 17, height '5\' 11"'$  (1.803 m), weight 78.3 kg, SpO2 96 %.   Exam:  Awake Alert, No new F.N deficits, Normal affect [place].AT,PERRAL Supple Neck,  trach with PMV, NG tube in place Symmetrical Chest wall movement, Good air movement bilaterally, CTAB RRR,No Gallops, Rubs or new Murmurs,  +ve B.Sounds, Abd Soft, No tenderness,   No Cyanosis, Clubbing or edema     Assessment/Plan: Aspiration pneumonia SIRS Improved-afebrile overnight-continue Unasyn, Follow cultures Much improved trach site secretions after we stopped IV fluids, gentle Lasix x1 on 98/03/3823, 1 application of scopolamine patch for 3 days, continue trach hygiene per RT and primary team which is ENT.  Monitor closely.   Nausea/vomiting Unclear etiology-could have been postoperative ileus. Currently resolved we will closely monitor.  HTN BP on the higher side but relatively stable All oral  medications on hold as patient n.p.o. We will use IV hydralazine as needed for now.  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 Redmond Baseman on 10/6-not ready for oral intake yet If Patient agrees-we will replace NG tube and start NG tube feedings Defer further to ENT  Nutrition Status: Nutrition Problem: Moderate Malnutrition Etiology: chronic illness (floor of mouth cancer) Signs/Symptoms: mild fat depletion, moderate muscle depletion Interventions: Tube feeding, Refer to RD note for recommendations    BMI: Estimated body mass index is 24.08 kg/m as calculated from the following:   Height as of this encounter: '5\' 11"'$  (1.803 m).   Weight as of this encounter: 78.3 kg.   Code status:   Code Status: Full Code   DVT Prophylaxis: Place and maintain sequential compression device Start: 10/16/21 1321 SCDs Start: 10/14/21 1520   Family Communication:  None at bedside  Disposition Plan: Defer to primary service  Diet: Diet Order             Diet NPO time specified  Diet effective now                    MEDICATIONS: Scheduled Meds:  alum & mag hydroxide-simeth  30 mL Oral Once   amLODipine  10 mg Per Tube Daily   bacitracin  1 Application Topical K5L   bisacodyl  10 mg Rectal Q0600   Chlorhexidine Gluconate Cloth  6 each Topical Q0600   feeding supplement (PROSource TF20)  60 mL Per Tube BID  hydrALAZINE  100 mg Per Tube Q8H   magnesium hydroxide  30 mL Per Tube BID   metoCLOPramide (REGLAN) injection  10 mg Intravenous Q6H   mouth rinse  15 mL Mouth Rinse 4 times per day   pantoprazole (PROTONIX) IV  40 mg Intravenous Q12H   polyethylene glycol  17 g Per Tube BID   scopolamine  1 patch Transdermal Q72H   Continuous Infusions:  sodium chloride Stopped (10/19/21 0058)   ampicillin-sulbactam (UNASYN) IV Stopped (10/19/21 0640)   feeding supplement (OSMOLITE  1.5 CAL) 55 mL/hr at 10/19/21 0700   sodium phosphate 30 mmol in dextrose 5 % 250 mL infusion 30 mmol (10/19/21 0844)   PRN Meds:.sodium chloride, hydrALAZINE, HYDROcodone-acetaminophen, labetalol, morphine injection, ondansetron (ZOFRAN) IV, mouth rinse   I have personally reviewed following labs and imaging studies  LABORATORY DATA:  Recent Labs  Lab 10/14/21 0602 10/16/21 0711 10/16/21 1434 10/17/21 0631 10/18/21 0328 10/19/21 0216  WBC 4.7 15.5*  --  14.3* 10.3 11.7*  HGB 14.0 13.1 12.7* 12.0* 11.5* 12.3*  HCT 42.7 39.8 39.3 36.6* 37.0* 37.8*  PLT 243 229  --  201 233 249  MCV 101.7* 100.5*  --  101.7* 103.9* 101.1*  MCH 33.3 33.1  --  33.3 32.3 32.9  MCHC 32.8 32.9  --  32.8 31.1 32.5  RDW 13.9 13.7  --  13.5 13.4 13.4  LYMPHSABS  --   --   --   --  1.0 1.2  MONOABS  --   --   --   --  1.0 1.2*  EOSABS  --   --   --   --  0.1 0.1  BASOSABS  --   --   --   --  0.0 0.1    Recent Labs  Lab 10/14/21 0602 10/16/21 0711 10/16/21 1118 10/16/21 1434 10/17/21 0631 10/18/21 0328 10/19/21 0216  NA 140 137  --   --  141 142 140  K 4.1 4.0  --   --  4.1 4.2 4.7  CL 104 99  --   --  107 105 106  CO2 26 29  --   --  '24 29 25  '$ GLUCOSE 103* 159*  --   --  109* 125* 128*  BUN 10 7*  --   --  12 7* 13  CREATININE 0.94 0.77  --   --  0.80 0.81 0.69  CALCIUM 10.5* 9.4  --   --  9.3 9.6 9.6  AST  --   --  16  --   --   --   --   ALT  --   --  9  --   --   --   --   ALKPHOS  --   --  41  --   --   --   --   BILITOT  --   --  0.9  --   --   --   --   ALBUMIN  --   --  3.2*  --   --   --   --   MG  --   --   --   --  2.1 2.1 2.4  PHOS  --   --   --   --  2.3* 2.5 2.1*  PROCALCITON  --   --  0.17  --   --  <0.10 <0.10  LATICACIDVEN  --   --  1.9  --   --   --   --  BNP  --   --   --  231.9*  --  201.3* 143.4*     RADIOLOGY STUDIES/RESULTS: DG Chest Port 1 View  Result Date: 10/18/2021 CLINICAL DATA:  Short of breath.  Follow-up study.  In-patient. EXAM: PORTABLE CHEST 1  VIEW COMPARISON:  10/16/2021 and older exams. FINDINGS: Cardiac silhouette is normal in size. No mediastinal or hilar masses. Tracheostomy tube is stable, well position. Nasal/orogastric tube has been advanced, no extending well into the stomach, tip in the distal stomach. Mild linear atelectasis at the left lung base, improved from the prior study. Remainder of the lungs is clear. No pleural effusion or pneumothorax. IMPRESSION: 1. Improved left lung base linear atelectasis. Remainder of the lungs is clear. 2. Tracheostomy tube and nasal/orogastric tube are well positioned, nasal/orogastric tube further inserted since the previous study. Electronically Signed   By: Lajean Manes M.D.   On: 10/18/2021 10:56   DG Abd 1 View  Result Date: 10/17/2021 CLINICAL DATA:  Orogastric tube placement EXAM: ABDOMEN - 1 VIEW COMPARISON:  Portable exam 1404 hours compared to 10/16/2021 FINDINGS: Orogastric tube traverses stomach and pylorus with tip projecting over the distal second portion of the duodenum; if placement of the tube tip within the stomach is desired, recommend withdrawal 13 cm. Visualized bowel gas pattern normal. Atelectasis at LEFT lung base. No acute osseous findings. IMPRESSION: Tip of orogastric tube projects over distal second portion of duodenum; consider withdrawing tube 13 cm if placement of tube tip within the stomach is desired. Electronically Signed   By: Lavonia Dana M.D.   On: 10/17/2021 14:13     LOS: 5 days   Signature  Lala Lund M.D on 10/19/2021 at 9:46 AM   -  To page go to [email or web address]

## 2021-10-19 NOTE — Progress Notes (Signed)
One of patient's staples was out and found on the bed. His site was still well-approximated. There was swelling on the back of the right side of his neck. MD notified.

## 2021-10-19 NOTE — Progress Notes (Signed)
Subjective: No new complaints, much less secretions now.  Objective: Vital signs in last 24 hours: Temp:  [98.4 F (36.9 C)-100 F (37.8 C)] 98.8 F (37.1 C) (10/08 0335) Pulse Rate:  [77-124] 102 (10/08 0917) Resp:  [14-23] 17 (10/08 0917) BP: (139-169)/(57-106) 163/74 (10/08 0900) SpO2:  [90 %-98 %] 96 % (10/08 0917) FiO2 (%):  [28 %] 28 % (10/08 0917) Weight:  [78.3 kg] 78.3 kg (10/08 0500) Weight change: -0.1 kg Last BM Date : 10/19/21  Intake/Output from previous day: 10/07 0701 - 10/08 0700 In: 1868.6 [I.V.:349.8; NG/GT:1098.8; IV Piggyback:420.1] Out: 1150 [Urine:1150] Intake/Output this shift: Total I/O In: 120.9 [NG/GT:110; IV Piggyback:10.9] Out: -   PHYSICAL EXAM: Awake and alert.  Neck looks good.  Floor of mouth flap skin is dark but the suture line appears to be holding.  Neck looks good.  Lab Results: Recent Labs    10/18/21 0328 10/19/21 0216  WBC 10.3 11.7*  HGB 11.5* 12.3*  HCT 37.0* 37.8*  PLT 233 249   BMET Recent Labs    10/18/21 0328 10/19/21 0216  NA 142 140  K 4.2 4.7  CL 105 106  CO2 29 25  GLUCOSE 125* 128*  BUN 7* 13  CREATININE 0.81 0.69  CALCIUM 9.6 9.6    Studies/Results: DG Chest Port 1 View  Result Date: 10/18/2021 CLINICAL DATA:  Short of breath.  Follow-up study.  In-patient. EXAM: PORTABLE CHEST 1 VIEW COMPARISON:  10/16/2021 and older exams. FINDINGS: Cardiac silhouette is normal in size. No mediastinal or hilar masses. Tracheostomy tube is stable, well position. Nasal/orogastric tube has been advanced, no extending well into the stomach, tip in the distal stomach. Mild linear atelectasis at the left lung base, improved from the prior study. Remainder of the lungs is clear. No pleural effusion or pneumothorax. IMPRESSION: 1. Improved left lung base linear atelectasis. Remainder of the lungs is clear. 2. Tracheostomy tube and nasal/orogastric tube are well positioned, nasal/orogastric tube further inserted since the previous  study. Electronically Signed   By: Lajean Manes M.D.   On: 10/18/2021 10:56   DG Abd 1 View  Result Date: 10/17/2021 CLINICAL DATA:  Orogastric tube placement EXAM: ABDOMEN - 1 VIEW COMPARISON:  Portable exam 1404 hours compared to 10/16/2021 FINDINGS: Orogastric tube traverses stomach and pylorus with tip projecting over the distal second portion of the duodenum; if placement of the tube tip within the stomach is desired, recommend withdrawal 13 cm. Visualized bowel gas pattern normal. Atelectasis at LEFT lung base. No acute osseous findings. IMPRESSION: Tip of orogastric tube projects over distal second portion of duodenum; consider withdrawing tube 13 cm if placement of tube tip within the stomach is desired. Electronically Signed   By: Lavonia Dana M.D.   On: 10/17/2021 14:13    Medications: I have reviewed the patient's current medications.  Assessment/Plan: Postop day 5.  Tracheostomy changed to #6 uncuffed.  He is able to speak very nicely with Passy-Muir valve.  Continue to monitor flap survival.  Continue NG tube feeds.  LOS: 5 days      Nathaniel Hicks 10/19/2021, 11:06 AM

## 2021-10-19 NOTE — Progress Notes (Signed)
Inpatient Rehab Admissions Coordinator Note:   Per therapy patient was screened for CIR candidacy by Andrea Ferrer Danford Bad, CCC-SLP. At this time, pt appears to be a potential candidate for CIR. Please place an IP Rehab MD consult order if pt would like to be considered.   Gayland Curry, [place], [place] Admissions Coordinator [phone] 10/19/21 4:04 PM

## 2021-10-20 ENCOUNTER — Inpatient Hospital Stay (HOSPITAL_COMMUNITY): Payer: Medicare Other

## 2021-10-20 LAB — CBC WITH DIFFERENTIAL/PLATELET
Abs Immature Granulocytes: 0.07 10*3/uL (ref 0.00–0.07)
Basophils Absolute: 0 10*3/uL (ref 0.0–0.1)
Basophils Relative: 0 %
Eosinophils Absolute: 0.1 10*3/uL (ref 0.0–0.5)
Eosinophils Relative: 1 %
HCT: 39 % (ref 39.0–52.0)
Hemoglobin: 12.3 g/dL — ABNORMAL LOW (ref 13.0–17.0)
Immature Granulocytes: 1 %
Lymphocytes Relative: 7 %
Lymphs Abs: 0.9 10*3/uL (ref 0.7–4.0)
MCH: 32.6 pg (ref 26.0–34.0)
MCHC: 31.5 g/dL (ref 30.0–36.0)
MCV: 103.4 fL — ABNORMAL HIGH (ref 80.0–100.0)
Monocytes Absolute: 1.6 10*3/uL — ABNORMAL HIGH (ref 0.1–1.0)
Monocytes Relative: 12 %
Neutro Abs: 10.4 10*3/uL — ABNORMAL HIGH (ref 1.7–7.7)
Neutrophils Relative %: 79 %
Platelets: 302 10*3/uL (ref 150–400)
RBC: 3.77 MIL/uL — ABNORMAL LOW (ref 4.22–5.81)
RDW: 13.7 % (ref 11.5–15.5)
WBC: 13.1 10*3/uL — ABNORMAL HIGH (ref 4.0–10.5)
nRBC: 0 % (ref 0.0–0.2)

## 2021-10-20 LAB — BASIC METABOLIC PANEL
Anion gap: 6 (ref 5–15)
BUN: 19 mg/dL (ref 8–23)
CO2: 33 mmol/L — ABNORMAL HIGH (ref 22–32)
Calcium: 9.8 mg/dL (ref 8.9–10.3)
Chloride: 108 mmol/L (ref 98–111)
Creatinine, Ser: 0.79 mg/dL (ref 0.61–1.24)
GFR, Estimated: >60 mL/min (ref >60)
Glucose, Bld: 128 mg/dL — ABNORMAL HIGH (ref 70–99)
Potassium: 3.8 mmol/L (ref 3.5–5.1)
Sodium: 147 mmol/L — ABNORMAL HIGH (ref 135–145)

## 2021-10-20 LAB — BRAIN NATRIURETIC PEPTIDE: B Natriuretic Peptide: 122.6 pg/mL — ABNORMAL HIGH (ref 0.0–100.0)

## 2021-10-20 LAB — MAGNESIUM: Magnesium: 2.4 mg/dL (ref 1.7–2.4)

## 2021-10-20 LAB — PHOSPHORUS: Phosphorus: 2.3 mg/dL — ABNORMAL LOW (ref 2.5–4.6)

## 2021-10-20 LAB — GLUCOSE, CAPILLARY
Glucose-Capillary: 125 mg/dL — ABNORMAL HIGH (ref 70–99)
Glucose-Capillary: 135 mg/dL — ABNORMAL HIGH (ref 70–99)
Glucose-Capillary: 136 mg/dL — ABNORMAL HIGH (ref 70–99)
Glucose-Capillary: 138 mg/dL — ABNORMAL HIGH (ref 70–99)
Glucose-Capillary: 138 mg/dL — ABNORMAL HIGH (ref 70–99)
Glucose-Capillary: 140 mg/dL — ABNORMAL HIGH (ref 70–99)

## 2021-10-20 LAB — PROCALCITONIN: Procalcitonin: <0.1 ng/mL

## 2021-10-20 MED ORDER — POTASSIUM PHOSPHATES 15 MMOLE/5ML IV SOLN
30.0000 mmol | Freq: Once | INTRAVENOUS | Status: AC
  Administered 2021-10-20: 30 mmol via INTRAVENOUS
  Filled 2021-10-20: qty 10

## 2021-10-20 MED ORDER — BOOST / RESOURCE BREEZE PO LIQD CUSTOM
1.0000 | Freq: Three times a day (TID) | ORAL | Status: DC
  Administered 2021-10-20 – 2021-10-22 (×5): 1 via ORAL

## 2021-10-20 MED ORDER — FREE WATER
250.0000 mL | Status: DC
  Administered 2021-10-20 – 2021-10-21 (×6): 250 mL

## 2021-10-20 NOTE — Progress Notes (Signed)
Subjective: He wants to drink water otherwise he feels well.  Objective: Vital signs in last 24 hours: Temp:  [98.1 F (36.7 C)-99.4 F (37.4 C)] 98.1 F (36.7 C) (10/09 0800) Pulse Rate:  [87-109] 88 (10/09 0800) Resp:  [14-27] 15 (10/09 0800) BP: (115-162)/(54-97) 153/65 (10/09 0800) SpO2:  [91 %-99 %] 96 % (10/09 0800) FiO2 (%):  [21 %-28 %] 21 % (10/09 0730) Weight change:  Last BM Date : 10/20/21  Intake/Output from previous day: 10/08 0701 - 10/09 0700 In: 1994 [NG/GT:1320; IV Piggyback:674] Out: 295 [Urine:295] Intake/Output this shift: No intake/output data recorded.  PHYSICAL EXAM: Awake and alert.  Neck looks good.  Floor of mouth looks the same.  Less clear saliva.  No signs of leakage or infection.  Tracheostomy is well seated.  His phonation is excellent.  Lab Results: Recent Labs    10/19/21 0216 10/20/21 0253  WBC 11.7* 13.1*  HGB 12.3* 12.3*  HCT 37.8* 39.0  PLT 249 302   BMET Recent Labs    10/19/21 0216 10/20/21 0253  NA 140 147*  K 4.7 3.8  CL 106 108  CO2 25 33*  GLUCOSE 128* 128*  BUN 13 19  CREATININE 0.69 0.79  CALCIUM 9.6 9.8    Studies/Results: DG Chest Port 1 View  Result Date: 10/18/2021 CLINICAL DATA:  Short of breath.  Follow-up study.  In-patient. EXAM: PORTABLE CHEST 1 VIEW COMPARISON:  10/16/2021 and older exams. FINDINGS: Cardiac silhouette is normal in size. No mediastinal or hilar masses. Tracheostomy tube is stable, well position. Nasal/orogastric tube has been advanced, no extending well into the stomach, tip in the distal stomach. Mild linear atelectasis at the left lung base, improved from the prior study. Remainder of the lungs is clear. No pleural effusion or pneumothorax. IMPRESSION: 1. Improved left lung base linear atelectasis. Remainder of the lungs is clear. 2. Tracheostomy tube and nasal/orogastric tube are well positioned, nasal/orogastric tube further inserted since the previous study. Electronically Signed   By:  Lajean Manes M.D.   On: 10/18/2021 10:56    Medications: I have reviewed the patient's current medications.  Assessment/Plan: Stable postop day 6.  We can do a swallowing evaluation now and limit him to clear liquids for now until we see how the surgical site is healing.  White blood cell count has raised a little bit.  Continue to monitor.  LOS: 6 days      Izora Gala 10/20/2021, 9:56 AM

## 2021-10-20 NOTE — Progress Notes (Signed)
Physical Therapy Treatment Patient Details Name: IRBY FAILS MRN: [id number] DOB: 30-Aug-1940 Today's Date: 10/20/2021   History of Present Illness 81 y.o. male admitted on 10/14/21 for surgical management of mouth CA.  Pt s/p floor of mouth resection, neck dissection bil, platysma flap clousre, trach and dental extractions.  Pt with significant PMH of HOH, HTN, testicular CA, R knee arthroscopy with lateral minesectomy.    PT Comments    Pt making steady progress toward goals, but still unsteady, deconditioned with tachycardia during gait in the halls.  Emphasis on transitions, sit to standing, pre gait for balance and stability and progression of gait in the halls.    Recommendations for follow up therapy are one component of a multi-disciplinary discharge planning process, led by the attending physician.  Recommendations may be updated based on patient status, additional functional criteria and insurance authorization.  Follow Up Recommendations  Acute inpatient rehab (3hours/day)     Assistance Recommended at Discharge Frequent or constant Supervision/Assistance  Patient can return home with the following A little help with walking and/or transfers;A little help with bathing/dressing/bathroom;Assistance with cooking/housework;Assist for transportation;Help with stairs or ramp for entrance   Equipment Recommendations  Other (comment) (TBD)    Recommendations for Other Services       Precautions / Restrictions Precautions Precautions: Fall Precaution Comments: trach, feeding tube (NGT) Other Brace: PMV during therapy Restrictions Weight Bearing Restrictions: No     Mobility  Bed Mobility Overal bed mobility: Needs Assistance Bed Mobility: Supine to Sit, Sit to Supine     Supine to sit: Supervision Sit to supine: Supervision   General bed mobility comments: pt needed minimal assist to scoot up toward HOB.    Transfers   Equipment used: None Transfers: Sit to/from  Stand, Bed to chair/wheelchair/BSC Sit to Stand: Min assist Stand pivot transfers: Min assist         General transfer comment: steady assist, pt needing leg support/stability against the bed frame.  cued for hand placement in/out of bed.    Ambulation/Gait Ambulation/Gait assistance: Min assist, Mod assist Gait Distance (Feet): 160 Feet   Gait Pattern/deviations: Step-through pattern Gait velocity: decreased Gait velocity interpretation: <1.8 ft/sec, indicate of risk for recurrent falls   General Gait Details: cues for posture, looking forward, stopping when it was clear he was fatigued.  Sat on RA 98-100%, but HR rose during gait to max of 142, sustained in the high 130's   Stairs             Wheelchair Mobility    Modified Rankin (Stroke Patients Only)       Balance Overall balance assessment: Needs assistance Sitting-balance support: No upper extremity supported, Feet supported Sitting balance-Leahy Scale: Fair     Standing balance support: Single extremity supported, No upper extremity supported, During functional activity Standing balance-Leahy Scale: Poor Standing balance comment: reliant on UE support or external support                            Cognition Arousal/Alertness: Awake/alert Behavior During Therapy: WFL for tasks assessed/performed Overall Cognitive Status: Within Functional Limits for tasks assessed                                 General Comments: pretty good per sister.        Exercises Other Exercises Other Exercises: Marching in place in standing to  prep for gait and improve stability x 10 steps each    General Comments        Pertinent Vitals/Pain Pain Assessment Pain Assessment: Faces Faces Pain Scale: No hurt Pain Intervention(s): Monitored during session    Home Living                          Prior Function            PT Goals (current goals can now be found in the care  plan section) Acute Rehab PT Goals Patient Stated Goal: to walk (get up off of his bottom), and ultimately go home PT Goal Formulation: With patient Time For Goal Achievement: 11/01/21 Potential to Achieve Goals: Good Progress towards PT goals: Progressing toward goals    Frequency    Min 3X/week      PT Plan Current plan remains appropriate    Co-evaluation              AM-PAC PT "6 Clicks" Mobility   Outcome Measure  Help needed turning from your back to your side while in a flat bed without using bedrails?: A Little Help needed moving from lying on your back to sitting on the side of a flat bed without using bedrails?: A Little Help needed moving to and from a bed to a chair (including a wheelchair)?: A Little Help needed standing up from a chair using your arms (e.g., wheelchair or bedside chair)?: A Little Help needed to walk in hospital room?: A Lot Help needed climbing 3-5 steps with a railing? : A Lot 6 Click Score: 16    End of Session   Activity Tolerance: Patient limited by fatigue;Patient tolerated treatment well Patient left: in bed;with call bell/phone within reach;with bed alarm set (in chair position) Nurse Communication: Mobility status PT Visit Diagnosis: Muscle weakness (generalized) (M62.81);Unsteadiness on feet (R26.81)     Time: 9622-2979 PT Time Calculation (min) (ACUTE ONLY): 23 min  Charges:  $Gait Training: 8-22 mins $Therapeutic Activity: 8-22 mins                     10/20/2021  Ginger Carne., PT Acute Rehabilitation Services [phone]  (office)   Tessie Fass Rosamund Nyland 10/20/2021, 2:06 PM

## 2021-10-20 NOTE — Procedures (Signed)
Cortrak  Person Inserting Tube:  Ranell Patrick D, RD Tube Type:  Cortrak - 43 inches Tube Size:  10 Tube Location:  Right nare Secured by: Bridle Technique Used to Measure Tube Placement:  Marking at nare/corner of mouth Cortrak Secured At:  62 cm   Cortrak Tube Team Note:  Consult received to place a Cortrak feeding tube.   X-ray is required, abdominal x-ray has been ordered by the Cortrak team. Please confirm tube placement before using the Cortrak tube.   If the tube becomes dislodged please keep the tube and contact the Cortrak team at [email or web address] for replacement.  If after hours and replacement cannot be delayed, place a NG tube and confirm placement with an abdominal x-ray.    Lockie Pares., RD, LDN, CNSC See AMiON for contact information

## 2021-10-20 NOTE — Progress Notes (Signed)
Speech Language Pathology Treatment: Nathaniel Hicks Speaking valve  Patient Details Name: Nathaniel Hicks MRN: [id number] DOB: 08-10-1940 Today's Date: 10/20/2021 Time: 1050-1107 SLP Time Calculation (min) (ACUTE ONLY): 17 min  Assessment / Plan / Recommendation Clinical Impression  Pt wearing PMSV all waking hours now that he has cuffless trach. Pt denies any discomfort, says he doesn't really know its there. SLP provided assist for pt to reach up to trach to feel for presence of valve and also removed valve to demonstrate what it feels like and absence of voice. Pt will need to wear PMSV when drinking so it is important to have an awareness of it. His intelligibility has improved over the weekend. He is about 90% intelligible to me at conversation level. Recommend pt continue to wear PMSV will intermittent supervision, all waking hours, while eating and drinking and during any and all therapies as tolerated. Will f/u for further speech interventions as tongue heals.   HPI HPI: Patient is a 81 y.o.  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 On October 3, pt underwent floor of mouth resection, 5 x 5 cm, Tracheostomy, Platysma flap closure, dental extractions #22-27, bilateral selective neck dissections zones 1-3. Subsequently admitted to the ICU for close monitoring-postoperative course complicated by nausea/vomiting requiring NG tube insertion.      SLP Plan  Continue with current plan of care      Recommendations for follow up therapy are one component of a multi-disciplinary discharge planning process, led by the attending physician.  Recommendations may be updated based on patient status, additional functional criteria and insurance authorization.    Recommendations  Medication Administration: Via alternative means Compensations: Slow rate;Small sips/bites      Patient may use Passy-Muir Speech Valve: During all therapies  with supervision;During all waking hours (remove during sleep);During PO intake/meals PMSV Supervision: Intermittent         Oral Care Recommendations: Oral care BID Follow Up Recommendations: Outpatient SLP SLP Visit Diagnosis: Dysphagia, oral phase (R13.11) Plan: Continue with current plan of care           Nathaniel Hicks, Katherene Ponto  10/20/2021, 11:25 AM

## 2021-10-20 NOTE — Progress Notes (Signed)
Nutrition Follow-up  DOCUMENTATION CODES:   Non-severe (moderate) malnutrition in context of chronic illness  INTERVENTION:   Tube feeds via Cortrak: - Osmolite 1.5 @ 55 ml/hr (1320 ml/day) - PROSource TF20 60 ml BID - Free water flushes per MD, currently 250 ml q 4 hours  Tube feeding regimen provides 2140 kcal, 123 grams of protein, and 1006 ml of H2O.  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  Ongoing, being addressed via tube feeds and diet advancement  GOAL:   Patient will meet greater than or equal to 90% of their needs  Met via TF at goal  MONITOR:   PO intake, Supplement acceptance, Diet advancement, Labs  REASON FOR ASSESSMENT:   Consult Enteral/tube feeding initiation and management  ASSESSMENT:   81-year-old male who presented on 10/03 for floor of mouth resection, neck dissection, platysma flap closure, tracheostomy, and dental extractions. PMH of floor of mouth cancer, arthritis, BPH, HOH, HLD, HTN, testicular cancer, vitamin D deficiency.  10/03 - s/p floor of mouth resection, tracheostomy, platysma flap closure, dental extractions, bilateral selective neck dissections, small-bore NG tube placement 10/04 - multiple episodes of projectile vomiting which caused small-bore NG tube to become dislodged, NG tube placed for decompression 10/05 - NG tube became dislodged and was removed, pt refusing NG tube reinsertion 10/06 - NG tube replaced (tip post-pyloric) 10/09 - diet advanced to clear liquids, NG tube exchanged for Cortrak and bridle  Discussed pt with RN and during ICU rounds. Pt with post-pyloric NG tube still in place and tube feeds are infusing at goal at time of ICU rounds. Later, NG tube was exchanged for Cortrak  and bridle. Diet advanced to clear liquids today. RD to add clear liquid oral nutrition supplements to maximize kcal and protein intake via PO route. Discussed with RN.  Admit weight: 83.9 kg Current weight: 78.3 kg  Current TF: Osmolite 1.5 @ 55 ml/hr, PROSource TF20 60 ml BID, free water flushes of 250 ml q 4 hours  Medications reviewed and include: dulcolax suppository, IV reglan 10 mg q 6 hours, IV protonix, miralax, scopolamine patch, IV abx, IV potassium phosphate 30 mmol once  Labs reviewed: sodium 147, phosphorus 2.3, WBC 13.1 CBG's: 125-153 x 24 hours  UOP: 295 ml + 5 unmeasured occurrences x 24 hours I/O's: +5.3 L since admit  Diet Order:   Diet Order             Diet clear liquid Room service appropriate? Yes; Fluid consistency: Thin  Diet effective now                   EDUCATION NEEDS:   Not appropriate for education at this time  Skin:  Skin Assessment: Skin Integrity Issues: Incisions: neck  Last BM:  10/20/21 type 7  Height:   Ht Readings from Last 1 Encounters:  10/14/21 5' 11" (1.803 m)    Weight:   Wt Readings from Last 1 Encounters:  10/19/21 78.3 kg    BMI:  Body mass index is 24.08 kg/m.  Estimated Nutritional Needs:   Kcal:  2000-2200  Protein:  110-130 grams  Fluid:  >2.0 L    Kate Holmes, MS, RD, LDN Inpatient Clinical Dietitian Please see AMiON for contact information.  

## 2021-10-20 NOTE — Evaluation (Signed)
Clinical/Bedside Swallow Evaluation Patient Details  Name: Nathaniel Hicks MRN: [id number] Date of Birth: January 23, 1940  Today's Date: 10/20/2021 Time: SLP Start Time (ACUTE ONLY): 24 SLP Stop Time (ACUTE ONLY): 1107 SLP Time Calculation (min) (ACUTE ONLY): 17 min  Past Medical History:  Past Medical History:  Diagnosis Date   Arthritis    BPH (benign prostatic hyperplasia)    Difficult intubation    HOH (hard of hearing)    Hyperlipidemia 11/02/2017   Hypertension    Hypertension 11/02/2017   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[place])    2014   Vitamin D deficiency 11/02/2017   Past Surgical History:  Past Surgical History:  Procedure Laterality Date   COLONOSCOPY N/A 11/24/2012   Procedure: COLONOSCOPY;  Surgeon: Rogene Houston, MD;  [facility];  Service: Endoscopy;  Laterality: N/A;  830-moved to [place] notified pt   FLOOR OF MOUTH BIOPSY N/A 10/14/2021   Procedure: FLOOR OF MOUTH RESECTION;  Surgeon: Melida Quitter, MD;  Location: [facility];  Service: ENT;  Laterality: N/A;   HEMORROIDECTOMY     KNEE ARTHROSCOPY WITH LATERAL MENISECTOMY Right 08/31/2017   Procedure: KNEE ARTHROSCOPY WITH LATERAL MENISECTOMY;  Surgeon: Carole Civil, MD;  Location: [facility];  Service: Orthopedics;  Laterality: Right;   LESION EXCISION N/A 03/23/2012   Procedure: EXCISION NEOPLASM SCALP ;  Surgeon: Jamesetta So, MD;  Location: [facility];  Service: General;  Laterality: N/A;  Excision of Scalp Neoplasm   ORCHIECTOMY Right 12/20/2012   Procedure: RIGHT RADICAL ORCHIECTOMY/POSSIBLE BX RIGHT TESTICLE;  Surgeon: Marissa Nestle, MD;  Location: [facility];  Service: Urology;  Laterality: Right;   PROSTATE SURGERY     RADICAL NECK DISSECTION Bilateral 10/14/2021   Procedure: NECK DISSECTION;  Surgeon: Melida Quitter, MD;  Location: [place];  Service: ENT;  Laterality: Bilateral;   SCALP LACERATION REPAIR     [facility]-Dr Tamala Julian   SKIN FULL THICKNESS GRAFT Bilateral 10/14/2021   Procedure: PLATYSMA FLAP CLOSURE;  Surgeon:  Melida Quitter, MD;  Location: [facility];  Service: ENT;  Laterality: Bilateral;   TOOTH EXTRACTION  10/14/2021   Procedure: DENTAL EXTRACTIONS;  Surgeon: Melida Quitter, MD;  Location: [place];  Service: ENT;;   TRACHEOSTOMY TUBE PLACEMENT N/A 10/14/2021   Procedure: TRACHEOSTOMY;  Surgeon: Melida Quitter, MD;  Location: [place];  Service: ENT;  Laterality: N/A;   HPI:  Patient is a 81 y.o.  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 On October 3, pt underwent floor of mouth resection, 5 x 5 cm, Tracheostomy, Platysma flap closure, dental extractions #22-27, bilateral selective neck dissections zones 1-3. Subsequently admitted to the ICU for close monitoring-postoperative course complicated by nausea/vomiting requiring NG tube insertion.    Assessment / Plan / Recommendation  Clinical Impression  Pt demonstrates mild oral dysphagia given decreased lingual ROM. Lingual tip has reduced ROM, but pt seems to get improve base of tongue contact for /g/ and /k/ sounds and is about 90% intelligible at conversation level. Pt unable to achieve negative pressure to sip from the straw, but does well sipping from a cup edge with support from SLP to hold cup or position NG tube appropriately. Pt consumed 8 oz of water and ginger ale with no wet vocal quality or cough, no multiple swallows or complaint. Pts rate of intake acceptable though he was able to drink consecutively as well without signs of aspiraiton. PMSV in place throughout. Recommend pt initiate  clear liquids per conversation with Dr Constance Holster. When ready to advance, suspect may have increased difficulty with purees and solids. Will f/u for advancement. May need instrumental assessment with solid textures given potential for residue. SLP Visit Diagnosis: Dysphagia, oral phase (R13.11)    Aspiration Risk  Mild aspiration risk;Risk for inadequate nutrition/hydration    Diet Recommendation Thin  liquid   Liquid Administration via: Cup;No straw Medication Administration: Via alternative means Compensations: Slow rate;Small sips/bites Postural Changes: Seated upright at 90 degrees    Other  Recommendations Oral Care Recommendations: Oral care BID    Recommendations for follow up therapy are one component of a multi-disciplinary discharge planning process, led by the attending physician.  Recommendations may be updated based on patient status, additional functional criteria and insurance authorization.  Follow up Recommendations Outpatient SLP      Assistance Recommended at Discharge    Functional Status Assessment Patient has had a recent decline in their functional status and demonstrates the ability to make significant improvements in function in a reasonable and predictable amount of time.  Frequency and Duration min 2x/week  2 weeks       Prognosis Prognosis for Safe Diet Advancement: Good      Swallow Study   General HPI: Patient is a 81 y.o.  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 On October 3, pt underwent floor of mouth resection, 5 x 5 cm, Tracheostomy, Platysma flap closure, dental extractions #22-27, bilateral selective neck dissections zones 1-3. Subsequently admitted to the ICU for close monitoring-postoperative course complicated by nausea/vomiting requiring NG tube insertion. Type of Study: Bedside Swallow Evaluation Previous Swallow Assessment: none Diet Prior to this Study: NPO;NG Tube Temperature Spikes Noted: No Respiratory Status: Room air History of Recent Intubation: No Behavior/Cognition: Alert;Cooperative;Pleasant mood Oral Cavity Assessment: Other (comment) (clear secretions on floor of mouth) Oral Care Completed by SLP: No Oral Cavity - Dentition: Adequate natural dentition Vision: Functional for self-feeding Self-Feeding Abilities: Needs assist Patient Positioning:  Upright in bed Baseline Vocal Quality: Normal Volitional Cough: Strong Volitional Swallow: Able to elicit    Oral/Motor/Sensory Function Overall Oral Motor/Sensory Function: Moderate impairment Facial ROM: Within Functional Limits Facial Symmetry: Within Functional Limits Facial Strength: Within Functional Limits Lingual ROM: Reduced right;Reduced left Lingual Symmetry: Within Functional Limits Lingual Strength: Reduced Lingual Sensation: Reduced Velum: Other (comment) (impaired - large bore NG tube in place) Mandible: Within Functional Limits   Ice Chips Ice chips: Within functional limits Presentation: Spoon   Thin Liquid Thin Liquid: Impaired Presentation: Cup;Self Fed;Straw Oral Phase Impairments: Reduced lingual movement/coordination Oral Phase Functional Implications: Prolonged oral transit;Other (comment) (could not draw up sips from straw)    Nectar Thick Nectar Thick Liquid: Not tested   Honey Thick Honey Thick Liquid: Not tested   Puree Puree: Impaired Presentation: Spoon Oral Phase Impairments: Reduced lingual movement/coordination Oral Phase Functional Implications: Prolonged oral transit Pharyngeal Phase Impairments: Wet Vocal Quality   Solid     Solid: Not tested      Latoyia Tecson, Katherene Ponto 10/20/2021,11:22 AM

## 2021-10-21 ENCOUNTER — Inpatient Hospital Stay (HOSPITAL_COMMUNITY): Payer: Medicare Other

## 2021-10-21 DIAGNOSIS — C069 Malignant neoplasm of mouth, unspecified: Secondary | ICD-10-CM | POA: Diagnosis not present

## 2021-10-21 LAB — CBC WITH DIFFERENTIAL/PLATELET
Abs Immature Granulocytes: 0.08 10*3/uL — ABNORMAL HIGH (ref 0.00–0.07)
Basophils Absolute: 0 10*3/uL (ref 0.0–0.1)
Basophils Relative: 0 %
Eosinophils Absolute: 0.3 10*3/uL (ref 0.0–0.5)
Eosinophils Relative: 2 %
HCT: 35 % — ABNORMAL LOW (ref 39.0–52.0)
Hemoglobin: 11 g/dL — ABNORMAL LOW (ref 13.0–17.0)
Immature Granulocytes: 1 %
Lymphocytes Relative: 10 %
Lymphs Abs: 1.3 10*3/uL (ref 0.7–4.0)
MCH: 32.3 pg (ref 26.0–34.0)
MCHC: 31.4 g/dL (ref 30.0–36.0)
MCV: 102.6 fL — ABNORMAL HIGH (ref 80.0–100.0)
Monocytes Absolute: 1.4 10*3/uL — ABNORMAL HIGH (ref 0.1–1.0)
Monocytes Relative: 12 %
Neutro Abs: 9.2 10*3/uL — ABNORMAL HIGH (ref 1.7–7.7)
Neutrophils Relative %: 75 %
Platelets: 295 10*3/uL (ref 150–400)
RBC: 3.41 MIL/uL — ABNORMAL LOW (ref 4.22–5.81)
RDW: 14.1 % (ref 11.5–15.5)
WBC: 12.3 10*3/uL — ABNORMAL HIGH (ref 4.0–10.5)
nRBC: 0 % (ref 0.0–0.2)

## 2021-10-21 LAB — CULTURE, BLOOD (ROUTINE X 2)
Culture: NO GROWTH
Culture: NO GROWTH
Special Requests: ADEQUATE
Special Requests: ADEQUATE

## 2021-10-21 LAB — BASIC METABOLIC PANEL
Anion gap: 9 (ref 5–15)
BUN: 19 mg/dL (ref 8–23)
CO2: 29 mmol/L (ref 22–32)
Calcium: 9.4 mg/dL (ref 8.9–10.3)
Chloride: 102 mmol/L (ref 98–111)
Creatinine, Ser: 0.77 mg/dL (ref 0.61–1.24)
GFR, Estimated: >60 mL/min (ref >60)
Glucose, Bld: 136 mg/dL — ABNORMAL HIGH (ref 70–99)
Potassium: 3.6 mmol/L (ref 3.5–5.1)
Sodium: 140 mmol/L (ref 135–145)

## 2021-10-21 LAB — MAGNESIUM: Magnesium: 2.2 mg/dL (ref 1.7–2.4)

## 2021-10-21 LAB — GLUCOSE, CAPILLARY
Glucose-Capillary: 125 mg/dL — ABNORMAL HIGH (ref 70–99)
Glucose-Capillary: 130 mg/dL — ABNORMAL HIGH (ref 70–99)
Glucose-Capillary: 110 mg/dL — ABNORMAL HIGH (ref 70–99)
Glucose-Capillary: 113 mg/dL — ABNORMAL HIGH (ref 70–99)
Glucose-Capillary: 130 mg/dL — ABNORMAL HIGH (ref 70–99)

## 2021-10-21 LAB — PROCALCITONIN: Procalcitonin: <0.1 ng/mL

## 2021-10-21 LAB — BRAIN NATRIURETIC PEPTIDE: B Natriuretic Peptide: 75.2 pg/mL (ref 0.0–100.0)

## 2021-10-21 LAB — PHOSPHORUS: Phosphorus: 2.9 mg/dL (ref 2.5–4.6)

## 2021-10-21 MED ORDER — CARVEDILOL 6.25 MG PO TABS
6.2500 mg | ORAL_TABLET | Freq: Two times a day (BID) | ORAL | Status: DC
  Administered 2021-10-21 – 2021-10-23 (×5): 6.25 mg
  Filled 2021-10-21 (×5): qty 1

## 2021-10-21 MED ORDER — PANTOPRAZOLE 2 MG/ML SUSPENSION
40.0000 mg | Freq: Two times a day (BID) | ORAL | Status: DC
  Administered 2021-10-21 – 2021-10-22 (×4): 40 mg
  Filled 2021-10-21 (×6): qty 20

## 2021-10-21 MED ORDER — METOCLOPRAMIDE HCL 5 MG/ML IJ SOLN: 5.0000 mg | Freq: Four times a day (QID) | INTRAMUSCULAR | Status: DC | PRN

## 2021-10-21 MED ORDER — FREE WATER
200.0000 mL | Freq: Four times a day (QID) | Status: DC
  Administered 2021-10-21 – 2021-10-23 (×8): 200 mL

## 2021-10-21 NOTE — Progress Notes (Signed)
Occupational Therapy Treatment Patient Details Name: Nathaniel Hicks MRN: [id number] DOB: 01/03/1941 Today's Date: 10/21/2021   History of present illness 81 y.o. male admitted on 10/14/21 for surgical management of mouth CA.  Pt s/p floor of mouth resection, neck dissection bil, platysma flap clousre, trach and dental extractions.  Pt with significant PMH of HOH, HTN, testicular CA, R knee arthroscopy with lateral minesectomy.   OT comments  Patient received in supine and agreeable to OT session. Patient able to get to EOB with supervision and verbal cues. Patient was able to perform grooming and UB bathing while seated on EOB and required min assist to change gowns. Patient stood from EOB to address toilet transfers and stated he felt weak for transfers at this time. Patient is motivated towards increasing mobility and functioning and would benefit from further OT services in AIR setting.    Recommendations for follow up therapy are one component of a multi-disciplinary discharge planning process, led by the attending physician.  Recommendations may be updated based on patient status, additional functional criteria and insurance authorization.    Follow Up Recommendations  Acute inpatient rehab (3hours/day)    Assistance Recommended at Discharge Frequent or constant Supervision/Assistance  Patient can return home with the following  Assistance with cooking/housework   Equipment Recommendations  Tub/shower seat    Recommendations for Other Services      Precautions / Restrictions Precautions Precautions: Fall Precaution Comments: trach, feeding tube (NGT) Required Braces or Orthoses: Other Brace Other Brace: PMV during therapy Restrictions Weight Bearing Restrictions: No       Mobility Bed Mobility Overal bed mobility: Needs Assistance Bed Mobility: Supine to Sit, Sit to Supine     Supine to sit: Supervision Sit to supine: Supervision   General bed mobility comments:  verbal cues for rail use and lines    Transfers Overall transfer level: Needs assistance Equipment used: None Transfers: Sit to/from Stand Sit to Stand: Min assist           General transfer comment: stood from EOB with min assist to power up and to stedy. Patient stated he felt too weak for transfer     Balance Overall balance assessment: Needs assistance Sitting-balance support: No upper extremity supported, Feet supported Sitting balance-Leahy Scale: Fair     Standing balance support: Single extremity supported, No upper extremity supported, During functional activity Standing balance-Leahy Scale: Poor Standing balance comment: reliant on therapist for support                           ADL either performed or assessed with clinical judgement   ADL Overall ADL's : Needs assistance/impaired     Grooming: Wash/dry hands;Wash/dry face;Oral care;Set up;Sitting Grooming Details (indicate cue type and reason): on EOB Upper Body Bathing: Supervision/ safety;Set up;Sitting Upper Body Bathing Details (indicate cue type and reason): on EOB         Lower Body Dressing: Minimal assistance;Sit to/from stand Lower Body Dressing Details (indicate cue type and reason): change gown               General ADL Comments: performed self care tasks seated on eOB    Extremity/Trunk Assessment              Vision       Perception     Praxis      Cognition Arousal/Alertness: Awake/alert Behavior During Therapy: WFL for tasks assessed/performed Overall Cognitive Status: Within Functional Limits for  tasks assessed Area of Impairment: Problem solving                             Problem Solving: Slow processing          Exercises      Shoulder Instructions       General Comments      Pertinent Vitals/ Pain       Pain Assessment Pain Assessment: Faces Faces Pain Scale: No hurt Pain Intervention(s): Monitored during session  Home  Living                                          Prior Functioning/Environment              Frequency  Min 3X/week        Progress Toward Goals  OT Goals(current goals can now be found in the care plan section)  Progress towards OT goals: Progressing toward goals  Acute Rehab OT Goals Patient Stated Goal: get more rehab OT Goal Formulation: With patient Time For Goal Achievement: 11/01/21 Potential to Achieve Goals: Good ADL Goals Pt Will Perform Grooming: with supervision;standing Pt Will Perform Lower Body Dressing: with supervision;sit to/from stand Pt Will Transfer to Toilet: with supervision;ambulating;regular height toilet Pt Will Perform Toileting - Clothing Manipulation and hygiene: with supervision;sitting/lateral leans;sit to/from stand  Plan Discharge plan remains appropriate    Co-evaluation                 AM-PAC OT "6 Clicks" Daily Activity     Outcome Measure   Help from another person eating meals?: A Little Help from another person taking care of personal grooming?: A Little Help from another person toileting, which includes using toliet, bedpan, or urinal?: A Little Help from another person bathing (including washing, rinsing, drying)?: A Little Help from another person to put on and taking off regular upper body clothing?: A Little Help from another person to put on and taking off regular lower body clothing?: A Little 6 Click Score: 18    End of Session Equipment Utilized During Treatment: Oxygen  OT Visit Diagnosis: Unsteadiness on feet (R26.81);Other abnormalities of gait and mobility (R26.89);Muscle weakness (generalized) (M62.81)   Activity Tolerance Patient tolerated treatment well   Patient Left in bed;with call bell/phone within reach;with bed alarm set   Nurse Communication Mobility status        Time: 6720-9470 OT Time Calculation (min): 28 min  Charges: OT General Charges $OT Visit: 1 Visit OT  Treatments $Self Care/Home Management : 8-22 mins $Therapeutic Activity: 8-22 mins  Lodema Hong, [place]  Office [phone]   Trixie Dredge 10/21/2021, 10:56 AM

## 2021-10-21 NOTE — Progress Notes (Signed)
Consult PROGRESS NOTE        PATIENT DETAILS Name: Nathaniel Hicks Age: 81 y.o. Sex: male Date of Birth: 01-27-1940 Admit Date: 10/14/2021 Admitting Physician Melida Quitter, MD NOI:[id number], Edwinna Areola, MD  Brief Summary: Patient is a 81 y.o.  male with history of head/Cancer (floor of the mouth)-brought to the hospital by ENT for floor of mouth resection, tracheotomy, neck dissection-subsequently admitted to the ICU for close monitoring-postoperative course complicated by nausea/vomiting requiring NG tube insertion.  Hospitalist service was consulted for management of possible aspiration pneumonia.  Significant microbiology data: 10/3>> COVID PCR: Negative 10/5>> blood culture: Negative   Subjective:  Patient in bed, appears comfortable, denies any headache, no fever, no chest pain or pressure, improved trach site secretions, no shortness of breath , no abdominal pain. No new focal weakness.  Objective: Vitals: Blood pressure (!) 143/62, pulse 80, temperature 99.2 F (37.3 C), temperature source Oral, resp. rate (!) 21, height '5\' 11"'$  (1.803 m), weight 78.3 kg, SpO2 100 %.   Exam:  Awake Alert, No new F.N deficits, Normal affect [place].AT,PERRAL Supple Neck,  trach with PMV, NG tube in place Symmetrical Chest wall movement, Good air movement bilaterally, CTAB RRR,No Gallops, Rubs or new Murmurs,  +ve B.Sounds, Abd Soft, No tenderness,   No Cyanosis, Clubbing or edema     Assessment/Plan: Aspiration pneumonia. SIRS -  Improved-afebrile overnight-continue Unasyn, Follow cultures, so far -ve, Much improved trach site secretions after we stopped IV fluids, gentle Lasix x1 on 88/08/9167, 1 application of scopolamine patch for 3 days, continue trach hygiene per RT and primary team which is ENT.  Monitor closely.   Nausea/vomiting - Unclear etiology-could have been postoperative ileus. Currently resolved we will closely monitor.  HTN - BP is adjusted further on  10/21/2021 for better control.  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  Diet per speech and ENT.     Nutrition Status: Nutrition Problem: Moderate Malnutrition Etiology: chronic illness (floor of mouth cancer) Signs/Symptoms: mild fat depletion, moderate muscle depletion Interventions: Boost Breeze, Refer to RD note for recommendations, Tube feeding    BMI: Estimated body mass index is 24.08 kg/m as calculated from the following:   Height as of this encounter: '5\' 11"'$  (1.803 m).   Weight as of this encounter: 78.3 kg.   Code status:   Code Status: Full Code   DVT Prophylaxis: Place and maintain sequential compression device Start: 10/16/21 1321 SCDs Start: 10/14/21 1520   Family Communication:  None at bedside  Disposition Plan: Defer to primary service  Diet: Diet Order             Diet clear liquid Room service appropriate? Yes; Fluid consistency: Thin  Diet effective now                    MEDICATIONS: Scheduled Meds:  amLODipine  10 mg Per Tube Daily   bacitracin  1 Application Topical I5W   carvedilol  6.25 mg Per Tube BID WC   Chlorhexidine Gluconate Cloth  6 each Topical Q0600   feeding supplement  1 Container Oral TID BM   feeding supplement (PROSource TF20)  60 mL Per Tube BID   free  water  200 mL Per Tube Q6H   hydrALAZINE  100 mg Per Tube Q8H   metoCLOPramide (REGLAN) injection  10 mg Intravenous Q6H   mouth rinse  15 mL Mouth Rinse 4 times per day   pantoprazole  40 mg Per Tube BID   polyethylene glycol  17 g Per Tube BID   scopolamine  1 patch Transdermal Q72H   Continuous Infusions:  sodium chloride 10 mL/hr at 10/19/21 1106   ampicillin-sulbactam (UNASYN) IV Stopped (10/21/21 0719)   feeding supplement (OSMOLITE 1.5 CAL) 55 mL/hr at 10/21/21 0800   PRN Meds:.sodium  chloride, hydrALAZINE, HYDROcodone-acetaminophen, labetalol, morphine injection, ondansetron (ZOFRAN) IV, mouth rinse   I have personally reviewed following labs and imaging studies  LABORATORY DATA:  Recent Labs  Lab 10/17/21 0631 10/18/21 0328 10/19/21 0216 10/20/21 0253 10/21/21 0347  WBC 14.3* 10.3 11.7* 13.1* 12.3*  HGB 12.0* 11.5* 12.3* 12.3* 11.0*  HCT 36.6* 37.0* 37.8* 39.0 35.0*  PLT 201 233 249 302 295  MCV 101.7* 103.9* 101.1* 103.4* 102.6*  MCH 33.3 32.3 32.9 32.6 32.3  MCHC 32.8 31.1 32.5 31.5 31.4  RDW 13.5 13.4 13.4 13.7 14.1  LYMPHSABS  --  1.0 1.2 0.9 1.3  MONOABS  --  1.0 1.2* 1.6* 1.4*  EOSABS  --  0.1 0.1 0.1 0.3  BASOSABS  --  0.0 0.1 0.0 0.0    Recent Labs  Lab 10/16/21 1118 10/16/21 1434 10/17/21 0631 10/18/21 0328 10/19/21 0216 10/20/21 0253 10/21/21 0347  NA  --   --  141 142 140 147* 140  K  --   --  4.1 4.2 4.7 3.8 3.6  CL  --   --  107 105 106 108 102  CO2  --   --  '24 29 25 '$ 33* 29  GLUCOSE  --   --  109* 125* 128* 128* 136*  BUN  --   --  12 7* '13 19 19  '$ CREATININE  --   --  0.80 0.81 0.69 0.79 0.77  CALCIUM  --   --  9.3 9.6 9.6 9.8 9.4  AST 16  --   --   --   --   --   --   ALT 9  --   --   --   --   --   --   ALKPHOS 41  --   --   --   --   --   --   BILITOT 0.9  --   --   --   --   --   --   ALBUMIN 3.2*  --   --   --   --   --   --   MG  --   --  2.1 2.1 2.4 2.4 2.2  PHOS  --   --  2.3* 2.5 2.1* 2.3* 2.9  PROCALCITON 0.17  --   --  <0.10 <0.10 <0.10 <0.10  LATICACIDVEN 1.9  --   --   --   --   --   --   BNP  --  231.9*  --  201.3* 143.4* 122.6* 75.2     RADIOLOGY STUDIES/RESULTS: DG Chest Port 1 View  Result Date: 10/21/2021 CLINICAL DATA:  81 year old male with shortness of breath. Tracheostomy. EXAM: PORTABLE CHEST 1 VIEW COMPARISON:  Portable chest 10/18/2021 and earlier. FINDINGS: Portable AP upright view at 0630 hours. Stable tracheostomy. Stable skin staples across the thoracic inlet. NG type tube has been removed  and  enteric feeding tube is now in place, tip just across midline in the right upper quadrant. Lung volumes and mediastinal contours are within normal limits. Left lung base ventilation remains improved since 10/16/2021, with minor residual streaky opacity most resembling atelectasis. Elsewhere allowing for portable technique the lungs are clear. No pneumothorax or pleural effusion. Negative visible bowel gas. No acute osseous abnormality identified. IMPRESSION: 1. NG type tube exchanged for enteric feeding tube, tip just across midline in the right upper quadrant could be within the distal stomach or duodenal bulb. 2. Stable tracheostomy and mild left lung base atelectasis. Electronically Signed   By: Genevie Ann M.D.   On: 10/21/2021 07:17   DG Abd Portable 1V  Result Date: 10/20/2021 CLINICAL DATA:  Feeding tube placement EXAM: PORTABLE ABDOMEN - 1 VIEW COMPARISON:  Abdominal radiograph dated October 17, 2021 FINDINGS: Feeding tube tip projects over the expected area of the distal stomach. Interval removal of gastric decompression tube. No gas-filled dilated loops of bowel seen in the visualized abdomen. Cardiac and mediastinal contours within normal limits. Tracheostomy tube in place. Left basilar atelectasis. IMPRESSION: Feeding tube tip projects over the expected area of the distal stomach. Electronically Signed   By: Yetta Glassman M.D.   On: 10/20/2021 16:44     LOS: 7 days   Signature  Lala Lund M.D on 10/21/2021 at 9:42 AM   -  To page go to [email or web address]

## 2021-10-21 NOTE — Progress Notes (Signed)
Inpatient Rehab Admissions Coordinator:    I met with pt. To discuss potential CIR admit. He is interested, states his sister can provide 24/7 support. I will open a case with his insurance and pursue for admit.   Clemens Catholic, [place], [place] Admissions Coordinator  [phone] ([place]) [phone] (office)

## 2021-10-21 NOTE — Progress Notes (Signed)
Subjective: Doing well.  Tolerating tube feeds.  Using Passy-Muir valve.  Objective: Vital signs in last 24 hours: Temp:  [98.1 F (36.7 C)-100.1 F (37.8 C)] 99.2 F (37.3 C) (10/10 0721) Pulse Rate:  [78-122] 88 (10/10 0600) Resp:  [11-26] 14 (10/10 0600) BP: (120-175)/(61-113) 175/73 (10/10 0600) SpO2:  [91 %-100 %] 100 % (10/10 0600) FiO2 (%):  [21 %-30 %] 28 % (10/10 0415) Wt Readings from Last 1 Encounters:  10/19/21 78.3 kg    Intake/Output from previous day: 10/09 0701 - 10/10 0700 In: 3370.4 [P.O.:720; NG/GT:1872; IV Piggyback:778.4] Out: 1150 [Urine:650; Stool:350] Intake/Output this shift: No intake/output data recorded.  General appearance: alert, cooperative, and no distress Throat: flap under tongue dark in color but excellent blood flow with needle poke Neck: incision clean and intact, cuffless Shiley in place with Passy-Muir valve, no stridor, great voice  Recent Labs    10/20/21 0253 10/21/21 0347  WBC 13.1* 12.3*  HGB 12.3* 11.0*  HCT 39.0 35.0*  PLT 302 295    Recent Labs    10/20/21 0253 10/21/21 0347  NA 147* 140  K 3.8 3.6  CL 108 102  CO2 33* 29  GLUCOSE 128* 136*  BUN 19 19  CREATININE 0.79 0.77  CALCIUM 9.8 9.4    Medications: I have reviewed the patient's current medications.  Assessment/Plan: Floor of mouth cancer  Doing quite well.  Transfer to regular room.  Continue to advance diet as able.   LOS: 7 days   Melida Quitter 10/21/2021, 8:17 AM

## 2021-10-21 NOTE — Progress Notes (Signed)
Speech Language Pathology Treatment: Dysphagia  Patient Details Name: Nathaniel Hicks MRN: [id number] DOB: 1940/06/21 Today's Date: 10/21/2021 Time: 2924-4628 SLP Time Calculation (min) (ACUTE ONLY): 18 min  Assessment / Plan / Recommendation Clinical Impression  Pt continues to progress; tolerating PMSV all waking hours, communicating well with family at bedside despite dysarthria. Pt ready to try some mashed potatoes, but reports not much appetite. Seems even better subjectively with swallowing today. Fed himself a few oz of puree without signs of residue. Ok to advance to pureed solids. Given newly missing dentition pt may not be ready for further advancement for a while. Will f/u for tolerance.  HPI HPI: Patient is a 81 y.o.  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 On October 3, pt underwent floor of mouth resection, 5 x 5 cm, Tracheostomy, Platysma flap closure, dental extractions #22-27, bilateral selective neck dissections zones 1-3. Subsequently admitted to the ICU for close monitoring-postoperative course complicated by nausea/vomiting requiring NG tube insertion.      SLP Plan         Recommendations for follow up therapy are one component of a multi-disciplinary discharge planning process, led by the attending physician.  Recommendations may be updated based on patient status, additional functional criteria and insurance authorization.    Recommendations  Diet recommendations: Dysphagia 1 (puree);Thin liquid Liquids provided via: Straw Medication Administration: Crushed with puree Supervision: Patient able to self feed Compensations: Slow rate;Small sips/bites Postural Changes and/or Swallow Maneuvers: Seated upright 90 degrees      Patient may use Passy-Muir Speech Valve: During all therapies with supervision;During all waking hours (remove during sleep);During PO intake/meals PMSV Supervision:  Intermittent         Oral Care Recommendations: Oral care BID Follow Up Recommendations: Outpatient SLP           Nathaniel Hicks, Katherene Ponto  10/21/2021, 2:17 PM

## 2021-10-22 DIAGNOSIS — C069 Malignant neoplasm of mouth, unspecified: Secondary | ICD-10-CM | POA: Diagnosis not present

## 2021-10-22 LAB — BASIC METABOLIC PANEL
Anion gap: 9 (ref 5–15)
BUN: 19 mg/dL (ref 8–23)
CO2: 26 mmol/L (ref 22–32)
Calcium: 9.6 mg/dL (ref 8.9–10.3)
Chloride: 104 mmol/L (ref 98–111)
Creatinine, Ser: 0.7 mg/dL (ref 0.61–1.24)
GFR, Estimated: >60 mL/min (ref >60)
Glucose, Bld: 123 mg/dL — ABNORMAL HIGH (ref 70–99)
Potassium: 4.2 mmol/L (ref 3.5–5.1)
Sodium: 139 mmol/L (ref 135–145)

## 2021-10-22 LAB — GLUCOSE, CAPILLARY
Glucose-Capillary: 106 mg/dL — ABNORMAL HIGH (ref 70–99)
Glucose-Capillary: 110 mg/dL — ABNORMAL HIGH (ref 70–99)
Glucose-Capillary: 124 mg/dL — ABNORMAL HIGH (ref 70–99)
Glucose-Capillary: 130 mg/dL — ABNORMAL HIGH (ref 70–99)
Glucose-Capillary: 147 mg/dL — ABNORMAL HIGH (ref 70–99)

## 2021-10-22 LAB — CBC WITH DIFFERENTIAL/PLATELET
Abs Immature Granulocytes: 0.09 10*3/uL — ABNORMAL HIGH (ref 0.00–0.07)
Basophils Absolute: 0.1 10*3/uL (ref 0.0–0.1)
Basophils Relative: 0 %
Eosinophils Absolute: 0.5 10*3/uL (ref 0.0–0.5)
Eosinophils Relative: 4 %
HCT: 32.8 % — ABNORMAL LOW (ref 39.0–52.0)
Hemoglobin: 10.8 g/dL — ABNORMAL LOW (ref 13.0–17.0)
Immature Granulocytes: 1 %
Lymphocytes Relative: 11 %
Lymphs Abs: 1.3 10*3/uL (ref 0.7–4.0)
MCH: 32.9 pg (ref 26.0–34.0)
MCHC: 32.9 g/dL (ref 30.0–36.0)
MCV: 100 fL (ref 80.0–100.0)
Monocytes Absolute: 1.2 10*3/uL — ABNORMAL HIGH (ref 0.1–1.0)
Monocytes Relative: 10 %
Neutro Abs: 8.3 10*3/uL — ABNORMAL HIGH (ref 1.7–7.7)
Neutrophils Relative %: 74 %
Platelets: 290 10*3/uL (ref 150–400)
RBC: 3.28 MIL/uL — ABNORMAL LOW (ref 4.22–5.81)
RDW: 13.8 % (ref 11.5–15.5)
WBC: 11.3 10*3/uL — ABNORMAL HIGH (ref 4.0–10.5)
nRBC: 0 % (ref 0.0–0.2)

## 2021-10-22 LAB — PROCALCITONIN: Procalcitonin: <0.1 ng/mL

## 2021-10-22 NOTE — PMR Pre-admission (Signed)
PMR Admission Coordinator Pre-Admission Assessment  Patient: Nathaniel Hicks is an 81 y.o., male MRN: [id number] DOB: February 01, 1940 Height: 5\' 11"  (180.3 cm) Weight: 80.1 kg  Insurance Information HMO:     PPO:      PCP:      IPA:      80/20:      OTHER:  PRIMARY: [employer] Med      Policy#: [id number]      Subscriber: Pt CM Name:       Phone#: [phone]     Fax#: [phone] Pre-Cert#: [id number]      Employer:  Benefits:  Phone #:      Name: I spoke with Dawn at [facility] who reports appeal was overturned and Pt. Is approved to admit 10/16 for 8 days with updated due 11/03/21 Eff Date: 01/12/2021 - still active Deductible: does not have one OOP Max: $8,300 ($1,852.86 met) CIR: $1,556/ admission copay SNF: $0.00 Copayment per day for days 1-20; $200 Copayment per day for days 21-100; Maximum of 100 days/benefit period Outpatient: 80% coverage; 20% co-insurance Home Health:  100% coverage, limited by medical necessity DME: 80% coverage; 20% co-insurance Providers: in network SECONDARY: Medicaid [place] Access      Policy#: [id number]     Phone#:   Financial Counselor:       Phone#:   The "Data Collection Information Summary" for patients in Inpatient Rehabilitation Facilities with attached "Privacy Act [place] Records" was provided and verbally reviewed with: Patient  Emergency Contact Information Contact Information     Name Relation Home Work Mobile   [place] Sister [phone]  [phone]   [place] Sister [phone]  [phone]       Current Medical History  Patient Admitting Diagnosis: Oral CA History of Present Illness:  Nathaniel Hicks is an 81 year old male with history of testicular cancer, BPH, HTN, Vit D deficiency, HOH who 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 Redmond Baseman. Post op NPO  with NGT for decompression due to nausea/vomiting but noted to have coffee ground drainage in NGT and developed fevers due to SIRS on 10/05. He was stated on IV antibiotics due to concerns aspiration PNA and/or wound infection.  NGT was pulled out and patient refused replacement but N/V resolved and cortak placed of nutritional support. Scopolamine patch added to  manage copious secretion from trach and he was started on PMSV trials. BSS showed mild oral dysphagia and has been advanced to  D1, thins. He is tolerating PMSV during daytime hours. PT/OT has been working with patient who continues to be limited by weakness and tachycardia with activity due to deconditioning. CIR recommended due to functional decline.     Patient's medical record from [facility]  has been reviewed by the rehabilitation admission coordinator and physician.  Past Medical History  Past Medical History:  Diagnosis Date   Arthritis    BPH (benign prostatic hyperplasia)    Difficult intubation    HOH (hard of hearing)    Hyperlipidemia 11/02/2017   Hypertension    Hypertension 11/02/2017   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[place])    2014   Vitamin D deficiency 11/02/2017    Has the patient had major surgery during 100 days prior to admission? Yes  Family History   family history includes Cancer in his brother and brother.  Current Medications  Current Facility-Administered Medications:    0.9 %  sodium chloride infusion, , Intravenous, PRN, Melida Quitter, MD, Last Rate: 10 mL/hr at 10/19/21 1106, New Bag at 10/19/21 1106   amLODipine (NORVASC) tablet 10 mg, 10 mg, Per Tube, Daily, Lala Lund K, MD, 10 mg at 10/22/21 1010   Ampicillin-Sulbactam (UNASYN) 3 g in sodium chloride 0.9 % 100 mL IVPB, 3 g, Intravenous, Q6H, Thurnell Lose, MD, Last Rate: 200 mL/hr at 10/22/21 1231, 3 g at 10/22/21 1231   bacitracin ointment 1 Application, 1 Application, Topical, B9T, Melida Quitter, MD, 1 Application at 90/30/09  1231   carvedilol (COREG) tablet 6.25 mg, 6.25 mg, Per Tube, BID WC, Lala Lund K, MD, 6.25 mg at 10/22/21 1010   Chlorhexidine Gluconate Cloth 2 % PADS 6 each, 6 each, Topical, Q0600, Melida Quitter, MD, 6 each at 10/22/21 0523   feeding supplement (BOOST / RESOURCE BREEZE) liquid 1 Container, 1 Container, Oral, TID BM, Melida Quitter, MD, 1 Container at 10/20/21 2001   feeding supplement (OSMOLITE 1.5 CAL) liquid 1,000 mL, 1,000 mL, Per Tube, Continuous, Izora Gala, MD, Last Rate: 55 mL/hr at 10/21/21 1800, Infusion Verify at 10/21/21 1800   feeding supplement (PROSource TF20) liquid 60 mL, 60 mL, Per Tube, BID, Izora Gala, MD, 60 mL at 10/22/21 1010   free water 200 mL, 200 mL, Per Tube, Q6H, Thurnell Lose, MD, 200 mL at 10/22/21 0523   hydrALAZINE (APRESOLINE) injection 10 mg, 10 mg, Intravenous, Q6H PRN, Thurnell Lose, MD, 10 mg at 10/19/21 0237   hydrALAZINE (APRESOLINE) tablet 100 mg, 100 mg, Per Tube, Q8H, Thurnell Lose, MD, 100 mg at 10/22/21 0519   HYDROcodone-acetaminophen (HYCET) 7.5-325 mg/15 ml solution 10-15 mL, 10-15 mL, Per Tube, Q4H PRN, Melida Quitter, MD, 15 mL at 10/20/21 1432   labetalol (NORMODYNE) injection 10 mg, 10 mg, Intravenous, Q2H PRN, Jerrell Belfast, MD, 10 mg at 10/14/21 2259   metoCLOPramide (REGLAN) injection 5 mg, 5 mg, Intravenous, Q6H PRN, Melida Quitter, MD   morphine (PF) 2 MG/ML injection 2-4 mg, 2-4 mg, Intravenous, Q2H PRN, Melida Quitter, MD, 2 mg at 10/17/21 2249   ondansetron (ZOFRAN) injection 4 mg, 4 mg, Intravenous, Q6H PRN, Melida Quitter, MD, 4 mg at 10/21/21 0109   Oral care mouth rinse, 15 mL, Mouth Rinse, 4 times per day, Melida Quitter, MD, 15 mL at 10/22/21 1010   Oral care mouth rinse, 15 mL, Mouth Rinse, PRN, Melida Quitter, MD   pantoprazole (PROTONIX) 2 mg/mL oral suspension 40 mg, 40 mg, Per Tube, BID, Bell, Lorin C, RPH, 40 mg at 10/21/21 2209   polyethylene glycol (MIRALAX / GLYCOLAX) packet 17 g, 17 g, Per Tube, BID,  Thurnell Lose, MD, 17 g at 10/22/21 1009  Patients Current Diet:  Diet Order             DIET - DYS 1 Room service appropriate? No; Fluid consistency: Thin  Diet effective now                   Precautions / Restrictions Precautions Precautions: Fall Precaution Comments: trach, feeding tube (NGT) Other Brace: PMV during therapy Restrictions Weight Bearing Restrictions: No   Has the patient had 2 or more falls or a fall with injury in the past year? No  Prior Activity Level Limited Community (1-2x/wk): pt. went out for appointments  Prior Functional Level Self Care: Did the patient need help bathing, dressing, using the toilet or eating? Independent  Indoor Mobility:  Did the patient need assistance with walking from room to room (with or without device)? Independent  Stairs: Did the patient need assistance with internal or external stairs (with or without device)? Independent  Functional Cognition: Did the patient need help planning regular tasks such as shopping or remembering to take medications? Independent  Patient Information Are you of Hispanic, Latino/a,or Spanish origin?: A. No, not of Hispanic, Latino/a, or Spanish origin What is your race?: B. Black or African American Do you need or want an interpreter to communicate with a doctor or health care staff?: 0. No  Patient's Response To:  Health Literacy and Transportation Is the patient able to respond to health literacy and transportation needs?: Yes Health Literacy - How often do you need to have someone help you when you read instructions, pamphlets, or other written material from your doctor or pharmacy?: Never In the past 12 months, has lack of transportation kept you from medical appointments or from getting medications?: No In the past 12 months, has lack of transportation kept you from meetings, work, or from getting things needed for daily living?: No  Development worker, international aid / [place] Devices/Equipment: Eyeglasses, Scales, Dentures (specify type), Cane (specify quad or straight), Wheelchair Home Equipment: Conservation officer, nature (2 wheels), [employer] - single point  Prior Device Use: Indicate devices/aids used by the patient prior to current illness, exacerbation or injury? None of the above  Current Functional Level Cognition  Overall Cognitive Status: Within Functional Limits for tasks assessed Orientation Level: Oriented X4 General Comments: pretty good per sister.    Extremity Assessment (includes Sensation/Coordination)  Upper Extremity Assessment: Defer to OT evaluation  Lower Extremity Assessment: Generalized weakness (Grossly 3/5 per seated MMT)    ADLs  Overall ADL's : Needs assistance/impaired Eating/Feeding: Set up, Sitting Grooming: Wash/dry hands, Wash/dry face, Oral care, Set up, Sitting Grooming Details (indicate cue type and reason): on EOB Upper Body Bathing: Supervision/ safety, Set up, Sitting Upper Body Bathing Details (indicate cue type and reason): on EOB Lower Body Bathing: Min guard, Sit to/from stand Upper Body Dressing : Supervision/safety, Set up, Sitting Lower Body Dressing: Minimal assistance, Sit to/from stand Lower Body Dressing Details (indicate cue type and reason): change gown Toilet Transfer: Minimal assistance, Stand-pivot, Rolling walker (2 wheels) Functional mobility during ADLs: Minimal assistance, Rolling walker (2 wheels) General ADL Comments: performed self care tasks seated on eOB    Mobility  Overal bed mobility: Needs Assistance Bed Mobility: Supine to Sit, Sit to Supine Supine to sit: Supervision Sit to supine: Supervision General bed mobility comments: verbal cues for rail use and lines    Transfers  Overall transfer level: Needs assistance Equipment used: None Transfers: Sit to/from Stand Sit to Stand: Min assist Bed to/from chair/wheelchair/BSC transfer type:: Stand pivot Stand pivot transfers: Min  assist General transfer comment: stood from EOB with min assist to power up and to stedy. Patient stated he felt too weak for transfer    Ambulation / Gait / Stairs / Wheelchair Mobility  Ambulation/Gait Ambulation/Gait assistance: Min assist, Mod assist Gait Distance (Feet): 160 Feet Assistive device: Rolling walker (2 wheels) (chair to follow for safety) Gait Pattern/deviations: Step-through pattern General Gait Details: cues for posture, looking forward, stopping when it was clear he was fatigued.  Sat on RA 98-100%, but HR rose during gait to max of 142, sustained in the high 130's Gait velocity: decreased Gait velocity interpretation: <1.8 ft/sec, indicate of risk for recurrent falls Pre-gait activities: standing marches with RW min  assist in prep for gait    Posture / Balance Balance Overall balance assessment: Needs assistance Sitting-balance support: No upper extremity supported, Feet supported Sitting balance-Leahy Scale: Fair Standing balance support: Single extremity supported, No upper extremity supported, During functional activity Standing balance-Leahy Scale: Poor Standing balance comment: reliant on therapist for support    Special needs/care consideration Skin intact  and Special service needs coretrak   Previous Home Environment (from acute therapy documentation) Living Arrangements: Alone  Lives With: Alone, Other (Comment) Available Help at Discharge: Family Type of Home: Apartment (second level) Home Layout: One level Home Access: Elevator, Level entry Bathroom Shower/Tub: Chiropodist: Standard Home Care Services: No Additional Comments: Sister and brother can come every day per patient  Discharge Living Setting Plans for Discharge Living Setting: Patient's home Type of Home at Discharge: Apartment Discharge Home Layout: One level Discharge Home Access: Level entry, Elevator Discharge Bathroom Shower/Tub: Tub/shower unit Discharge  Bathroom Toilet: Standard Discharge Bathroom Accessibility: Yes How Accessible: Accessible via walker Does the patient have any problems obtaining your medications?: No  Social/Family/Support Systems Patient Roles: Other (Comment) Contact Information: [phone] Anticipated Caregiver: helen [place] Crane Ability/Limitations of Caregiver: Sister can provide 24/7 min a Caregiver Availability: 24/7 Discharge Plan Discussed with Primary Caregiver: Yes Is Caregiver In Agreement with Plan?: No Does Caregiver/Family have Issues with Lodging/Transportation while Pt is in Rehab?: No  Goals Patient/Family Goal for Rehab: PT/OT/SLP Supervision-mod I Expected length of stay: 10-12 days Pt/Family Agrees to Admission and willing to participate: Yes Program Orientation Provided & Reviewed with Pt/Caregiver Including Roles  & Responsibilities: Yes  Decrease burden of Care through IP rehab admission: n/a  Possible need for SNF placement upon discharge: not anticipated   Patient Condition: I have reviewed medical records from [facility], spoken with CSW, and patient. I met with patient at the bedside for inpatient rehabilitation assessment.  Patient will benefit from ongoing PT, OT, and SLP, can actively participate in 3 hours of therapy a day 5 days of the week, and can make measurable gains during the admission.  Patient will also benefit from the coordinated team approach during an Inpatient Acute Rehabilitation admission.  The patient will receive intensive therapy as well as Rehabilitation physician, nursing, social worker, and care management interventions.  Due to safety, skin/wound care, disease management, medication administration, pain management, and patient education the patient requires 24 hour a day rehabilitation nursing.  The patient is currently min A with mobility and basic ADLs.  Discharge setting and therapy post discharge at home with home health is anticipated.  Patient has agreed to  participate in the Acute Inpatient Rehabilitation Program and will admit today.  Preadmission Screen Completed By:  Genella Mech, 10/22/2021 2:13 PM _________________________________________________________ Discussed status with Dr. Tressa Busman  on 10/28/21 at 64 and received approval for admission today.  Admission Coordinator:  Genella Mech, CCC-SLP, time 532 /Date 10/28/21  Assessment/Plan: Diagnosis: Does the need for close, 24 hr/day Medical supervision in concert with the patient's rehab needs make it unreasonable for this patient to be served in a less intensive setting? Yes Co-Morbidities requiring supervision/potential complications: Dysphagia, wound management, tracheostomy, urinary incontinence, pain control Due to bladder management, skin/wound care, disease management, medication administration, pain management, and patient education, does the patient require 24 hr/day rehab nursing? Yes Does the patient require coordinated care of a physician, rehab nurse, PT, OT, and SLP to address physical and functional deficits in the context of the above medical diagnosis(es)? Yes Addressing  deficits in the following areas: balance, endurance, locomotion, strength, transferring, bowel/bladder control, bathing, dressing, feeding, grooming, toileting, speech, swallowing, and psychosocial support Can the patient actively participate in an intensive therapy program of at least 3 hrs of therapy 5 days a week? Yes The potential for patient to make measurable gains while on inpatient rehab is excellent Anticipated functional outcomes upon discharge from inpatient rehab: modified independent and supervision PT, modified independent OT, modified independent SLP Estimated rehab length of stay to reach the above functional goals is: 10-14 days Anticipated discharge destination: Home 10. Overall Rehab/Functional Prognosis: excellent   MD Signature:  Gertie Gowda, DO 10/28/2021

## 2021-10-22 NOTE — Progress Notes (Signed)
Speech Language Pathology Treatment: Dysphagia  Patient Details Name: Nathaniel Hicks MRN: [id number] DOB: 19-Aug-1940 Today's Date: 10/22/2021 Time: 1325-1350 SLP Time Calculation (min) (ACUTE ONLY): 25 min  Assessment / Plan / Recommendation Clinical Impression  Pt a bit more ornery today; he is tired at lunch time, has been up in chair and just worked with PT. Could not get comfortable in bed to feed himself so he moved to sit edge of bed with success and fed himself. Pt does reports some feeling that food is sticking so suggestion to follow bites with sips given with success. Pt at about 30% of meal, but says food makes him feel like hes going to throw up because its so bad. Unfortunately, I don't think he would tolerate upgraded texture. His lingual control is impaired, he has decreased sensation and no functional dentition. He could be given food from home or take out if it were a soft mushy texture. Would suggest removing NG tube and doing a calorie count. Some of his discomfort and poor appetite may be related to tube and feeding.  Continue to recommend AIR. Will f/u.   HPI HPI: Patient is a 81 y.o.  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 On October 3, pt underwent floor of mouth resection, 5 x 5 cm, Tracheostomy, Platysma flap closure, dental extractions #22-27, bilateral selective neck dissections zones 1-3. Subsequently admitted to the ICU for close monitoring-postoperative course complicated by nausea/vomiting requiring NG tube insertion.      SLP Plan  Continue with current plan of care      Recommendations for follow up therapy are one component of a multi-disciplinary discharge planning process, led by the attending physician.  Recommendations may be updated based on patient status, additional functional criteria and insurance authorization.    Recommendations  Diet recommendations: Dysphagia 1 (puree);Thin  liquid Liquids provided via: Cup Medication Administration: Crushed with puree Supervision: Patient able to self feed Compensations: Slow rate;Small sips/bites;Follow solids with liquid      Patient may use Passy-Muir Speech Valve: During all therapies with supervision;During all waking hours (remove during sleep);During PO intake/meals PMSV Supervision: Intermittent         General recommendations: Rehab consult Oral Care Recommendations: Oral care BID Follow Up Recommendations: Acute inpatient rehab (3hours/day) Assistance recommended at discharge: Intermittent Supervision/Assistance Plan: Continue with current plan of care           Yanet Balliet, Katherene Ponto  10/22/2021, 1:49 PM

## 2021-10-22 NOTE — Progress Notes (Signed)
Physical Therapy Treatment Patient Details Name: CABOT CROMARTIE MRN: [id number] DOB: 12/17/1940 Today's Date: 10/22/2021   History of Present Illness 81 y.o. male admitted on 10/14/21 for surgical management of mouth CA.  Pt s/p floor of mouth resection, neck dissection bil, platysma flap clousre, trach and dental extractions.  Pt with significant PMH of HOH, HTN, testicular CA, R knee arthroscopy with lateral minesectomy.    PT Comments    Continuing work on functional mobility and activity tolerance;  Mr. Arts had recently gotten back to bed after spending the morning OOB in the recliner, and he still agrees to PT despite trying hard to get a nap; session focused on functional gait, specifically looking at managing RW in tighter spaces, and maneuvering with multiple lines and leads; Needed close guard for safety, and min assist for lines; Uses grab bars well for stand<>sit transfer in bathroom to commode, successful BM, and pt seemed relieved;   He continues to be motivated to return to independence and works hard with therapies; continue to heartily recommend AIR for post-acute rehab to maximize independence and safety with mobility and ADLs to allow for safe dc home   Recommendations for follow up therapy are one component of a multi-disciplinary discharge planning process, led by the attending physician.  Recommendations may be updated based on patient status, additional functional criteria and insurance authorization.  Follow Up Recommendations  Acute inpatient rehab (3hours/day)     Assistance Recommended at Discharge Frequent or constant Supervision/Assistance  Patient can return home with the following A little help with walking and/or transfers;A little help with bathing/dressing/bathroom;Assistance with cooking/housework;Assist for transportation;Help with stairs or ramp for entrance   Equipment Recommendations  BSC/3in1;Rolling walker (2 wheels)    Recommendations for Other  Services Rehab consult     Precautions / Restrictions Precautions Precautions: Fall Precaution Comments: trach, feeding tube (NGT) Required Braces or Orthoses: Other Brace Other Brace: PMV during therapy Restrictions Weight Bearing Restrictions: No     Mobility  Bed Mobility Overal bed mobility: Needs Assistance Bed Mobility: Supine to Sit, Sit to Supine     Supine to sit: Supervision Sit to supine: Min assist   General bed mobility comments: verbal cues for rail use to get up; min assist for lines getting back to bed    Transfers Overall transfer level: Needs assistance Equipment used: Rolling walker (2 wheels) Transfers: Sit to/from Stand Sit to Stand: Mod assist, Min assist           General transfer comment: Light mod assist to rise to stand from EOB; Cues for safety and Better sit<>stand in bathroom at commode with use of grabbars    Ambulation/Gait Ambulation/Gait assistance: Min assist, +2 safety/equipment Gait Distance (Feet): 40 Feet (to toilet, then sink, then in room, and back to bed) Assistive device: Rolling walker (2 wheels) Gait Pattern/deviations: Step-through pattern       General Gait Details: cues for posture, looking forward, and to self-monitor for activity tolerance; Today's walk was in pt's room and bathroom, and gave a good look at his problem solving with RW maneuvering, turns, and approach to toilet and sink; needed assist/cues for turning direction for better line poistioning;  Sat on RA 98-100%, did not note fatigue   Stairs             Wheelchair Mobility    Modified Rankin (Stroke Patients Only)       Balance     Sitting balance-Leahy Scale: Fair  Standing balance-Leahy Scale: Poor (approaching Fair)                              Cognition Arousal/Alertness: Awake/alert Behavior During Therapy: WFL for tasks assessed/performed Overall Cognitive Status: Impaired/Different from baseline Area of  Impairment: Problem solving                               General Comments: When in the slightly more pressured situation of getting to commode quickly fo ra BM, pt needed help with more complex problem solving of line management while turning with RW to commode        Exercises      General Comments General comments (skin integrity, edema, etc.): Overall VSS on RA with waling in room and time for BM in bathroom      Pertinent Vitals/Pain Pain Assessment Pain Assessment: Faces Faces Pain Scale: No hurt Pain Intervention(s): Monitored during session    Home Living                          Prior Function            PT Goals (current goals can now be found in the care plan section) Acute Rehab PT Goals Patient Stated Goal: to walk (get up off of his bottom), and ultimately go home; specific to today, needing to move bowels PT Goal Formulation: With patient Time For Goal Achievement: 11/01/21 Potential to Achieve Goals: Good Progress towards PT goals: Progressing toward goals    Frequency    Min 3X/week      PT Plan Current plan remains appropriate    Co-evaluation              AM-PAC PT "6 Clicks" Mobility   Outcome Measure  Help needed turning from your back to your side while in a flat bed without using bedrails?: A Little Help needed moving from lying on your back to sitting on the side of a flat bed without using bedrails?: A Little Help needed moving to and from a bed to a chair (including a wheelchair)?: A Little Help needed standing up from a chair using your arms (e.g., wheelchair or bedside chair)?: A Little Help needed to walk in hospital room?: A Lot Help needed climbing 3-5 steps with a railing? : A Lot 6 Click Score: 16    End of Session Equipment Utilized During Treatment: Gait belt Activity Tolerance: Patient tolerated treatment well Patient left: in bed;with call bell/phone within reach;with nursing/sitter in  room Nurse Communication: Mobility status PT Visit Diagnosis: Muscle weakness (generalized) (M62.81);Unsteadiness on feet (R26.81) Pain - part of body:  (Neck)     Time: 8841-6606 PT Time Calculation (min) (ACUTE ONLY): 23 min  Charges:  $Gait Training: 8-22 mins $Therapeutic Activity: 8-22 mins                     Roney Marion, [place] [facility] [place] 10/22/2021, 2:52 PM

## 2021-10-22 NOTE — Plan of Care (Signed)
  Problem: Education: Goal: Knowledge of General Education information will improve Description: Including pain rating scale, medication(s)/side effects and non-pharmacologic comfort measures Outcome: Progressing   Problem: Health Behavior/Discharge Planning: Goal: Ability to manage health-related needs will improve Outcome: Progressing   Problem: Clinical Measurements: Goal: Ability to maintain clinical measurements within normal limits will improve Outcome: Progressing Goal: Will remain free from infection Outcome: Progressing Goal: Diagnostic test results will improve Outcome: Progressing Goal: Respiratory complications will improve Outcome: Progressing Goal: Cardiovascular complication will be avoided Outcome: Progressing   Problem: Activity: Goal: Risk for activity intolerance will decrease Outcome: Progressing   Problem: Nutrition: Goal: Adequate nutrition will be maintained Outcome: Progressing   Problem: Coping: Goal: Level of anxiety will decrease Outcome: Progressing   Problem: Elimination: Goal: Will not experience complications related to bowel motility Outcome: Progressing Goal: Will not experience complications related to urinary retention Outcome: Progressing   Problem: Pain Managment: Goal: General experience of comfort will improve Outcome: Progressing   Problem: Safety: Goal: Ability to remain free from injury will improve Outcome: Progressing   Problem: Skin Integrity: Goal: Risk for impaired skin integrity will decrease Outcome: Progressing   Problem: Education: Goal: Knowledge of the prescribed therapeutic regimen will improve Outcome: Progressing   Problem: Activity: Goal: Ability to tolerate increased activity will improve Outcome: Progressing   Problem: Health Behavior/Discharge Planning: Goal: Identification of resources available to assist in meeting health care needs will improve Outcome: Progressing   Problem:  Nutrition: Goal: Maintenance of adequate nutrition will improve Outcome: Progressing   Problem: Clinical Measurements: Goal: Complications related to the disease process, condition or treatment will be avoided or minimized Outcome: Progressing   Problem: Respiratory: Goal: Will regain and/or maintain adequate ventilation Outcome: Progressing   Problem: Skin Integrity: Goal: Demonstration of wound healing without infection will improve Outcome: Progressing   

## 2021-10-22 NOTE — Progress Notes (Signed)
Consult PROGRESS NOTE        PATIENT DETAILS Name: Nathaniel Hicks Age: 81 y.o. Sex: male Date of Birth: [birth year] Admit Date: 10/14/2021 Admitting Physician Melida Quitter, MD QJJ:HERD, Nathaniel Areola, MD  Brief Summary: Patient is a 81 y.o.  male with history of head/Cancer (floor of the mouth)-brought to the hospital by ENT for floor of mouth resection, tracheotomy, neck dissection-subsequently admitted to the ICU for close monitoring-postoperative course complicated by nausea/vomiting requiring NG tube insertion.  Hospitalist service was consulted for management of possible aspiration pneumonia.  Significant microbiology data: 10/3>> COVID PCR: Negative 10/5>> blood culture: Negative   Subjective:  Patient in bed, appears comfortable, denies any headache, no fever, no chest pain or pressure, improved trach site secretions, no shortness of breath , no abdominal pain. No new focal weakness.  Objective: Vitals: Blood pressure (!) 148/60, pulse 92, temperature 98.1 F (36.7 C), temperature source Oral, resp. rate 16, height '5\' 11"'$  (1.803 m), weight 80.1 kg, SpO2 97 %.   Exam:  Awake Alert, No new F.N deficits, Normal affect [place].AT,PERRAL Supple Neck, trach with PMV, NG tube in place Symmetrical Chest wall movement, Good air movement bilaterally, CTAB RRR,No Gallops, Rubs or new Murmurs,  +ve B.Sounds, Abd Soft, No tenderness,   No Cyanosis, Clubbing or edema   Assessment/Plan:  Aspiration pneumonia. SIRS -  Improved-afebrile overnight-continue Unasyn, Follow cultures, so far -ve, Much improved trach site secretions after we stopped IV fluids, gentle Lasix x1 on 40/08/1446, 1 application of scopolamine patch for 3 days, continue trach hygiene per RT and primary team which is ENT.  Much better from pneumonia standpoint.  Finish antibiotics after total of 7 days.  Nausea/vomiting - Unclear etiology-could have been postoperative ileus. Currently resolved we will  closely monitor.  HTN - BP is adjusted further on 10/21/2021 for better control.  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  Diet per speech and ENT.  DVT prophylaxis per primary team based on bleeding risk for now on SCDs.     Nutrition Status: Nutrition Problem: Moderate Malnutrition Etiology: chronic illness (floor of mouth cancer) Signs/Symptoms: mild fat depletion, moderate muscle depletion Interventions: Boost Breeze, Refer to RD note for recommendations, Tube feeding    BMI: Estimated body mass index is 24.63 kg/m as calculated from the following:   Height as of this encounter: '5\' 11"'$  (1.803 m).   Weight as of this encounter: 80.1 kg.   Code status:   Code Status: Full Code   DVT Prophylaxis: Per primary team. Place and maintain sequential compression device Start: 10/16/21 1321 SCDs Start: 10/14/21 1520   Family Communication:  None at bedside  Disposition Plan: Defer to primary service  Diet: Diet Order             DIET - DYS 1 Room service appropriate? No; Fluid consistency: Thin  Diet effective now                    MEDICATIONS: Scheduled Meds:  amLODipine  10 mg Per Tube Daily   bacitracin  1 Application Topical J8H   carvedilol  6.25 mg Per Tube BID WC   Chlorhexidine Gluconate Cloth  6 each Topical Q0600  feeding supplement  1 Container Oral TID BM   feeding supplement (PROSource TF20)  60 mL Per Tube BID   free water  200 mL Per Tube Q6H   hydrALAZINE  100 mg Per Tube Q8H   mouth rinse  15 mL Mouth Rinse 4 times per day   pantoprazole  40 mg Per Tube BID   polyethylene glycol  17 g Per Tube BID   Continuous Infusions:  sodium chloride 10 mL/hr at 10/19/21 1106   ampicillin-sulbactam (UNASYN) IV 3 g (10/22/21 0520)   feeding supplement (OSMOLITE 1.5 CAL) 55 mL/hr at 10/21/21  1800   PRN Meds:.sodium chloride, hydrALAZINE, HYDROcodone-acetaminophen, labetalol, metoCLOPramide (REGLAN) injection, morphine injection, ondansetron (ZOFRAN) IV, mouth rinse   I have personally reviewed following labs and imaging studies  LABORATORY DATA:  Recent Labs  Lab 10/18/21 0328 10/19/21 0216 10/20/21 0253 10/21/21 0347 10/22/21 0148  WBC 10.3 11.7* 13.1* 12.3* 11.3*  HGB 11.5* 12.3* 12.3* 11.0* 10.8*  HCT 37.0* 37.8* 39.0 35.0* 32.8*  PLT 233 249 302 295 290  MCV 103.9* 101.1* 103.4* 102.6* 100.0  MCH 32.3 32.9 32.6 32.3 32.9  MCHC 31.1 32.5 31.5 31.4 32.9  RDW 13.4 13.4 13.7 14.1 13.8  LYMPHSABS 1.0 1.2 0.9 1.3 1.3  MONOABS 1.0 1.2* 1.6* 1.4* 1.2*  EOSABS 0.1 0.1 0.1 0.3 0.5  BASOSABS 0.0 0.1 0.0 0.0 0.1    Recent Labs  Lab 10/16/21 1118 10/16/21 1434 10/17/21 0631 10/18/21 0328 10/19/21 0216 10/20/21 0253 10/21/21 0347 10/22/21 0148  NA  --   --  141 142 140 147* 140 139  K  --   --  4.1 4.2 4.7 3.8 3.6 4.2  CL  --   --  107 105 106 108 102 104  CO2  --   --  '24 29 25 '$ 33* 29 26  GLUCOSE  --   --  109* 125* 128* 128* 136* 123*  BUN  --   --  12 7* '13 19 19 19  '$ CREATININE  --   --  0.80 0.81 0.69 0.79 0.77 0.70  CALCIUM  --   --  9.3 9.6 9.6 9.8 9.4 9.6  AST 16  --   --   --   --   --   --   --   ALT 9  --   --   --   --   --   --   --   ALKPHOS 41  --   --   --   --   --   --   --   BILITOT 0.9  --   --   --   --   --   --   --   ALBUMIN 3.2*  --   --   --   --   --   --   --   MG  --   --  2.1 2.1 2.4 2.4 2.2  --   PHOS  --   --  2.3* 2.5 2.1* 2.3* 2.9  --   PROCALCITON 0.17  --   --  <0.10 <0.10 <0.10 <0.10 <0.10  LATICACIDVEN 1.9  --   --   --   --   --   --   --   BNP  --  231.9*  --  201.3* 143.4* 122.6* 75.2  --      RADIOLOGY STUDIES/RESULTS: DG Chest Port 1 View  Result Date: 10/21/2021 CLINICAL DATA:  81 year old male with shortness of  breath. Tracheostomy. EXAM: PORTABLE CHEST 1 VIEW COMPARISON:  Portable chest 10/18/2021 and  earlier. FINDINGS: Portable AP upright view at 0630 hours. Stable tracheostomy. Stable skin staples across the thoracic inlet. NG type tube has been removed and enteric feeding tube is now in place, tip just across midline in the right upper quadrant. Lung volumes and mediastinal contours are within normal limits. Left lung base ventilation remains improved since 10/16/2021, with minor residual streaky opacity most resembling atelectasis. Elsewhere allowing for portable technique the lungs are clear. No pneumothorax or pleural effusion. Negative visible bowel gas. No acute osseous abnormality identified. IMPRESSION: 1. NG type tube exchanged for enteric feeding tube, tip just across midline in the right upper quadrant could be within the distal stomach or duodenal bulb. 2. Stable tracheostomy and mild left lung base atelectasis. Electronically Signed   By: Genevie Ann M.D.   On: 10/21/2021 07:17   DG Abd Portable 1V  Result Date: 10/20/2021 CLINICAL DATA:  Feeding tube placement EXAM: PORTABLE ABDOMEN - 1 VIEW COMPARISON:  Abdominal radiograph dated October 17, 2021 FINDINGS: Feeding tube tip projects over the expected area of the distal stomach. Interval removal of gastric decompression tube. No gas-filled dilated loops of bowel seen in the visualized abdomen. Cardiac and mediastinal contours within normal limits. Tracheostomy tube in place. Left basilar atelectasis. IMPRESSION: Feeding tube tip projects over the expected area of the distal stomach. Electronically Signed   By: Yetta Glassman M.D.   On: 10/20/2021 16:44     LOS: 8 days   Signature  Lala Lund M.D on 10/22/2021 at 10:55 AM   -  To page go to [email or web address]

## 2021-10-22 NOTE — Progress Notes (Addendum)
Inpatient Rehab Admissions Coordinator:    Insurance requesting peer to peer for CIR. Information given to Dr. Redmond Baseman' office. Pt. Wishes to appeal if insurance denies case. I will follow for potential admit pending insurance auth. Sister confirmed she can provide 24/7 min A at d/c  Clemens Catholic, [place], [place] Admissions Coordinator  [phone] ([place]) [phone] (office)

## 2021-10-22 NOTE — Progress Notes (Signed)
Inpatient Rehab Admissions Coordinator:    Ins denied request for CIR. Pt. Wishes to appeal. I will send appeal once I have updated PT note.   Clemens Catholic, [place], [place] Admissions Coordinator  [phone] ([place]) [phone] (office)

## 2021-10-22 NOTE — Progress Notes (Signed)
Subjective: Doing well.  Taking some oral diet.  Tolerating tube feeds.  Using Passy-Muir valve.  Objective: Vital signs in last 24 hours: Temp:  [98.1 F (36.7 C)-99 F (37.2 C)] 98.1 F (36.7 C) (10/11 0815) Pulse Rate:  [72-97] 92 (10/11 0815) Resp:  [16-20] 16 (10/11 0815) BP: (120-155)/(54-74) 148/60 (10/11 0815) SpO2:  [95 %-99 %] 97 % (10/11 0815) FiO2 (%):  [21 %] 21 % (10/11 0728) Weight:  [80.1 kg] 80.1 kg (10/11 0500) Wt Readings from Last 1 Encounters:  10/22/21 80.1 kg    Intake/Output from previous day: 10/10 0701 - 10/11 0700 In: 1011.7 [P.O.:150; NG/GT:605; IV Piggyback:256.7] Out: 700 [Urine:700] Intake/Output this shift: Total I/O In: -  Out: 900 [Urine:900]  General appearance: alert, cooperative, and no distress Throat: flap under tongue dark but intact Neck: incision clean and intact, staples in place, trach site stable with cuffless trach and Passy-Muir valve  Recent Labs    10/21/21 0347 10/22/21 0148  WBC 12.3* 11.3*  HGB 11.0* 10.8*  HCT 35.0* 32.8*  PLT 295 290    Recent Labs    10/21/21 0347 10/22/21 0148  NA 140 139  K 3.6 4.2  CL 102 104  CO2 29 26  GLUCOSE 136* 123*  BUN 19 19  CREATININE 0.77 0.70  CALCIUM 9.4 9.6    Medications: I have reviewed the patient's current medications.  Assessment/Plan: Floor of mouth cancer  Doing well.  Advance diet as able.  Yet to be determined if he will be able to maintain an oral diet sufficiently to allow removal of the feeding tube.  Considering inpatient rehab at discharge.  Will remove staples tomorrow.   LOS: 8 days   Melida Quitter 10/22/2021, 1:27 PM

## 2021-10-22 NOTE — Care Management Important Message (Signed)
Important Message  Patient Details  Name: Nathaniel Hicks MRN: [id number] Date of Birth: 02-Apr-1940   Medicare Important Message Given:  Yes     Jaxin Fulfer 10/22/2021, 3:15 PM

## 2021-10-23 DIAGNOSIS — C069 Malignant neoplasm of mouth, unspecified: Secondary | ICD-10-CM | POA: Diagnosis not present

## 2021-10-23 LAB — GLUCOSE, CAPILLARY
Glucose-Capillary: 104 mg/dL — ABNORMAL HIGH (ref 70–99)
Glucose-Capillary: 103 mg/dL — ABNORMAL HIGH (ref 70–99)
Glucose-Capillary: 104 mg/dL — ABNORMAL HIGH (ref 70–99)
Glucose-Capillary: 101 mg/dL — ABNORMAL HIGH (ref 70–99)

## 2021-10-23 MED ORDER — HYDRALAZINE HCL 50 MG PO TABS
100.0000 mg | ORAL_TABLET | Freq: Three times a day (TID) | ORAL | Status: DC
  Administered 2021-10-23 – 2021-10-28 (×14): 100 mg via ORAL
  Filled 2021-10-23 (×15): qty 2

## 2021-10-23 MED ORDER — CARVEDILOL 6.25 MG PO TABS
6.2500 mg | ORAL_TABLET | Freq: Two times a day (BID) | ORAL | Status: DC
  Administered 2021-10-23 – 2021-10-28 (×10): 6.25 mg via ORAL
  Filled 2021-10-23 (×10): qty 1

## 2021-10-23 MED ORDER — HYDROCODONE-ACETAMINOPHEN 7.5-325 MG PO TABS
0.5000 | ORAL_TABLET | ORAL | Status: DC | PRN
  Administered 2021-10-27 (×2): 1 via ORAL
  Filled 2021-10-23 (×2): qty 1

## 2021-10-23 MED ORDER — ENSURE ENLIVE PO LIQD
237.0000 mL | Freq: Three times a day (TID) | ORAL | Status: DC
  Administered 2021-10-23 – 2021-10-28 (×12): 237 mL via ORAL

## 2021-10-23 MED ORDER — POLYETHYLENE GLYCOL 3350 17 G PO PACK
17.0000 g | PACK | Freq: Two times a day (BID) | ORAL | Status: DC
  Administered 2021-10-23 – 2021-10-28 (×8): 17 g via ORAL
  Filled 2021-10-23 (×11): qty 1

## 2021-10-23 MED ORDER — ENOXAPARIN SODIUM 40 MG/0.4ML IJ SOSY
40.0000 mg | PREFILLED_SYRINGE | INTRAMUSCULAR | Status: DC
  Administered 2021-10-23 – 2021-10-28 (×6): 40 mg via SUBCUTANEOUS
  Filled 2021-10-23 (×6): qty 0.4

## 2021-10-23 MED ORDER — AMLODIPINE BESYLATE 10 MG PO TABS
10.0000 mg | ORAL_TABLET | Freq: Every day | ORAL | Status: DC
  Administered 2021-10-23 – 2021-10-28 (×6): 10 mg via ORAL
  Filled 2021-10-23 (×6): qty 1

## 2021-10-23 MED ORDER — PANTOPRAZOLE 2 MG/ML SUSPENSION
40.0000 mg | Freq: Two times a day (BID) | ORAL | Status: DC
  Administered 2021-10-23 – 2021-10-28 (×11): 40 mg via ORAL
  Filled 2021-10-23 (×12): qty 20

## 2021-10-23 NOTE — Progress Notes (Signed)
Physical Therapy Treatment Patient Details Name: Nathaniel Hicks MRN: [id number] DOB: Apr 27, 1940 Today's Date: 10/23/2021   History of Present Illness 81 y.o. male admitted on 10/14/21 for surgical management of mouth CA.  Pt s/p floor of mouth resection, neck dissection bil, platysma flap clousre, trach and dental extractions.  Pt with significant PMH of HOH, HTN, testicular CA, R knee arthroscopy with lateral minesectomy.    PT Comments    Pt received in bed agreeable to participation in therapy. He required supervision supine to sit, min assist sit to stand, min assist ambulation 150' with RW, and min guard assist sit to supine. Weakness noted BLE and trunk, resulting in truncal instability during gait, ant/post sway. Pt is very motivated to continue participation in therapy and progress independence.    Recommendations for follow up therapy are one component of a multi-disciplinary discharge planning process, led by the attending physician.  Recommendations may be updated based on patient status, additional functional criteria and insurance authorization.  Follow Up Recommendations  Acute inpatient rehab (3hours/day)     Assistance Recommended at Discharge Frequent or constant Supervision/Assistance  Patient can return home with the following A little help with walking and/or transfers;A little help with bathing/dressing/bathroom;Assistance with cooking/housework;Assist for transportation;Help with stairs or ramp for entrance   Equipment Recommendations  BSC/3in1;Rolling walker (2 wheels)    Recommendations for Other Services       Precautions / Restrictions Precautions Precautions: Fall;Other (comment) Precaution Comments: trach, feeding tube (NGT) Other Brace: PMV during therapy     Mobility  Bed Mobility Overal bed mobility: Needs Assistance Bed Mobility: Supine to Sit, Sit to Supine     Supine to sit: Supervision, HOB elevated Sit to supine: Min guard   General bed  mobility comments: +rail, increased time    Transfers Overall transfer level: Needs assistance Equipment used: Rolling walker (2 wheels) Transfers: Sit to/from Stand Sit to Stand: Min assist           General transfer comment: cues for hand placement, assist to power up    Ambulation/Gait Ambulation/Gait assistance: Min assist Gait Distance (Feet): 150 Feet Assistive device: Rolling walker (2 wheels) Gait Pattern/deviations: Step-through pattern, Decreased stride length, Shuffle Gait velocity: decreased Gait velocity interpretation: <1.8 ft/sec, indicate of risk for recurrent falls   General Gait Details: occasional shuffle with decreased foot clearance bilat, trunk and LE weakness resulting in decreased stability and unsteady gait   Stairs             Wheelchair Mobility    Modified Rankin (Stroke Patients Only)       Balance Overall balance assessment: Needs assistance Sitting-balance support: No upper extremity supported, Feet supported Sitting balance-Leahy Scale: Fair     Standing balance support: Bilateral upper extremity supported, During functional activity, Reliant on assistive device for balance Standing balance-Leahy Scale: Poor                              Cognition Arousal/Alertness: Awake/alert Behavior During Therapy: WFL for tasks assessed/performed Overall Cognitive Status: Impaired/Different from baseline Area of Impairment: Problem solving                             Problem Solving: Slow processing, Difficulty sequencing          Exercises      General Comments General comments (skin integrity, edema, etc.): TC in place on arrival  with humidified air. PMV placed and pt mobilized on RA. SpO2 97-100%.      Pertinent Vitals/Pain Pain Assessment Pain Assessment: Faces Faces Pain Scale: Hurts a little bit Pain Location: mouth Pain Descriptors / Indicators: Discomfort Pain Intervention(s): Monitored  during session    Home Living                          Prior Function            PT Goals (current goals can now be found in the care plan section) Acute Rehab PT Goals Patient Stated Goal: home Progress towards PT goals: Progressing toward goals    Frequency    Min 3X/week      PT Plan Current plan remains appropriate    Co-evaluation              AM-PAC PT "6 Clicks" Mobility   Outcome Measure  Help needed turning from your back to your side while in a flat bed without using bedrails?: A Little Help needed moving from lying on your back to sitting on the side of a flat bed without using bedrails?: A Little Help needed moving to and from a bed to a chair (including a wheelchair)?: A Little Help needed standing up from a chair using your arms (e.g., wheelchair or bedside chair)?: A Little Help needed to walk in hospital room?: A Little Help needed climbing 3-5 steps with a railing? : A Lot 6 Click Score: 17    End of Session Equipment Utilized During Treatment: Gait belt Activity Tolerance: Patient tolerated treatment well Patient left: in bed;with call bell/phone within reach;with bed alarm set Nurse Communication: Mobility status PT Visit Diagnosis: Muscle weakness (generalized) (M62.81);Unsteadiness on feet (R26.81)     Time: 2202-5427 PT Time Calculation (min) (ACUTE ONLY): 25 min  Charges:  $Gait Training: 23-37 mins                     Lorrin Goodell, [place]  Office # [phone] Pager [phone]    Lorriane Shire 10/23/2021, 9:08 AM

## 2021-10-23 NOTE — Progress Notes (Signed)
RT has been unavailable for assessment.  RT has been in the ED.  RN aware.

## 2021-10-23 NOTE — Progress Notes (Signed)
Occupational Therapy Treatment Patient Details Name: Nathaniel Hicks MRN: [id number] DOB: 1940-03-10 Today's Date: 10/23/2021   History of present illness 81 y.o. male admitted on 10/14/21 for surgical management of mouth CA.  Pt s/p floor of mouth resection, neck dissection bil, platysma flap clousre, trach and dental extractions.  Pt with significant PMH of HOH, HTN, testicular CA, R knee arthroscopy with lateral minesectomy.   OT comments  Pt progressing towards established OT goals. Pt adjusting socks using figure four without difficulty. Pt performing functional mobility in hallway with Min A and RW. Challenging standing balance with grooming task at sink. Pt with several posterior LOB and requiring Min A for correction. Continue to recommend dc to post-acute rehab and will continue to follow acutely as admitted.    Recommendations for follow up therapy are one component of a multi-disciplinary discharge planning process, led by the attending physician.  Recommendations may be updated based on patient status, additional functional criteria and insurance authorization.    Follow Up Recommendations  Acute inpatient rehab (3hours/day)    Assistance Recommended at Discharge Frequent or constant Supervision/Assistance  Patient can return home with the following  Assistance with cooking/housework   Equipment Recommendations  Tub/shower seat    Recommendations for Other Services PT consult    Precautions / Restrictions Precautions Precautions: Fall;Other (comment) Precaution Comments: trach, feeding tube (NGT) Other Brace: PMV during therapy Restrictions Weight Bearing Restrictions: No       Mobility Bed Mobility Overal bed mobility: Needs Assistance Bed Mobility: Sit to Supine       Sit to supine: Min guard   General bed mobility comments: Increased time and Min Guard A for safety    Transfers Overall transfer level: Needs assistance Equipment used: Rolling walker (2  wheels) Transfers: Sit to/from Stand Sit to Stand: Min assist           General transfer comment: Min A for gaining balance     Balance Overall balance assessment: Needs assistance Sitting-balance support: No upper extremity supported, Feet supported Sitting balance-Leahy Scale: Fair     Standing balance support: Bilateral upper extremity supported, During functional activity, Reliant on assistive device for balance Standing balance-Leahy Scale: Poor                             ADL either performed or assessed with clinical judgement   ADL Overall ADL's : Needs assistance/impaired     Grooming: Wash/dry face;Min guard;Minimal assistance;Standing Grooming Details (indicate cue type and reason): Min Guard A for safety. Pt with mutiple LOB posteriorly; taking steps backwards. Requiring MIn A for gaining balance     Lower Body Bathing: Min guard;Sit to/from stand Lower Body Bathing Details (indicate cue type and reason): Pt using figure four for adjusting socks         Toilet Transfer: Minimal assistance;Ambulation;Rolling walker (2 wheels) Toilet Transfer Details (indicate cue type and reason): Min A for balance         Functional mobility during ADLs: Minimal assistance;Rolling walker (2 wheels) General ADL Comments: Pt performing functional mobility in hallway and then challenging stanidng balance at sink.    Extremity/Trunk Assessment Upper Extremity Assessment Upper Extremity Assessment: Generalized weakness   Lower Extremity Assessment Lower Extremity Assessment: Defer to PT evaluation        Vision       Perception     Praxis      Cognition Arousal/Alertness: Awake/alert Behavior During Therapy: [facility]  for tasks assessed/performed Overall Cognitive Status: Impaired/Different from baseline Area of Impairment: Problem solving                             Problem Solving: Slow processing, Difficulty sequencing General Comments:  Requiring increased time for processing.        Exercises      Shoulder Instructions       General Comments VSS. Sister's present throughout    Pertinent Vitals/ Pain       Pain Assessment Pain Assessment: Faces Pain Score: 2  Faces Pain Scale: Hurts a little bit Pain Location: mouth Pain Descriptors / Indicators: Discomfort Pain Intervention(s): Monitored during session, Limited activity within patient's tolerance, Repositioned  Home Living                                          Prior Functioning/Environment              Frequency  Min 3X/week        Progress Toward Goals  OT Goals(current goals can now be found in the care plan section)  Progress towards OT goals: Progressing toward goals  Acute Rehab OT Goals OT Goal Formulation: With patient Time For Goal Achievement: 11/01/21 Potential to Achieve Goals: Good ADL Goals Pt Will Perform Grooming: with supervision;standing Pt Will Perform Lower Body Dressing: with supervision;sit to/from stand Pt Will Transfer to Toilet: with supervision;ambulating;regular height toilet Pt Will Perform Toileting - Clothing Manipulation and hygiene: with supervision;sitting/lateral leans;sit to/from stand  Plan Discharge plan remains appropriate    Co-evaluation                 AM-PAC OT "6 Clicks" Daily Activity     Outcome Measure   Help from another person eating meals?: A Little Help from another person taking care of personal grooming?: A Little Help from another person toileting, which includes using toliet, bedpan, or urinal?: A Little Help from another person bathing (including washing, rinsing, drying)?: A Little Help from another person to put on and taking off regular upper body clothing?: A Little Help from another person to put on and taking off regular lower body clothing?: A Little 6 Click Score: 18    End of Session Equipment Utilized During Treatment: Oxygen (vai  trach)  OT Visit Diagnosis: Unsteadiness on feet (R26.81);Other abnormalities of gait and mobility (R26.89);Muscle weakness (generalized) (M62.81)   Activity Tolerance Patient tolerated treatment well   Patient Left with call bell/phone within reach;in bed;with bed alarm set;with family/visitor present   Nurse Communication Mobility status        Time: 7494-4967 OT Time Calculation (min): 21 min  Charges: OT General Charges $OT Visit: 1 Visit OT Treatments $Self Care/Home Management : 8-22 mins  Briyan Kleven MSOT, OTR/L Acute Rehab Office: [place] 10/23/2021, 4:37 PM

## 2021-10-23 NOTE — Progress Notes (Signed)
Discontinue Cortrak, pt tolerated well. Pt is sitting in chair. Call button, phone in reach

## 2021-10-23 NOTE — Progress Notes (Signed)
Brief Nutrition Note  New consult received for kcal count. Pt's cortrak tube was removed today. Diet advanced to DYS 1 on 10/10 after SLP evaluation.   50% average intake x 2 meals noted.   Entered instructions for kcal count for RN and will hang envelope and follow-up to evaluate meal tickets and if PO intake is adequate. Will add nutrition supplements to augment intake.  Ranell Patrick, RD, LDN Clinical Dietitian RD pager # available in [place]  After hours/weekend pager # available in [facility]

## 2021-10-23 NOTE — Progress Notes (Signed)
Consult PROGRESS NOTE        PATIENT DETAILS Name: Nathaniel Hicks Age: 81 y.o. Sex: male Date of Birth: 02-28-1940 Admit Date: 10/14/2021 Admitting Physician Melida Quitter, MD [id number], Edwinna Areola, MD  Brief Summary: Patient is a 81 y.o.  male with history of head/Cancer (floor of the mouth)-brought to the hospital by ENT for floor of mouth resection, tracheotomy, neck dissection-subsequently admitted to the ICU for close monitoring-postoperative course complicated by nausea/vomiting requiring NG tube insertion.  Hospitalist service was consulted for management of possible aspiration pneumonia.  Significant microbiology data: 10/3>> COVID PCR: Negative 10/5>> blood culture: Negative   Subjective:  Patient in bed, appears comfortable, denies any headache, no fever, no chest pain or pressure, no shortness of breath , no abdominal pain. No focal weakness.  Objective: Vitals: Blood pressure (!) 124/58, pulse 95, temperature 98.1 F (36.7 C), temperature source Oral, resp. rate 17, height '5\' 11"'$  (1.803 m), weight 80.1 kg, SpO2 98 %.   Exam:  Awake Alert, No new F.N deficits, Normal affect [place].AT,PERRAL Supple Neck, trach with PMV, NG tube in place Symmetrical Chest wall movement, Good air movement bilaterally, CTAB RRR,No Gallops, Rubs or new Murmurs,  +ve B.Sounds, Abd Soft, No tenderness,   No Cyanosis, Clubbing or edema   Assessment/Plan:  Aspiration pneumonia. SIRS -  Improved-afebrile overnight-continue Unasyn, Follow cultures, so far -ve, Much improved trach site secretions after we stopped IV fluids, gentle Lasix x1 on 93/02/6710, 1 application of scopolamine patch for 3 days, continue trach hygiene per RT and primary team which is ENT.  Much better from pneumonia standpoint.  Likely pneumonia has resolved he has finished his antibiotic treatment x7 days.  Case discussed with Dr. Redmond Baseman primary team, hospitalist team will sign off kindly call with any  questions.  Nausea/vomiting - Unclear etiology-could have been postoperative ileus. Currently resolved we will closely monitor.  HTN - BP is adjusted further on 10/21/2021 for better control.  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  Diet per speech and ENT.  Gust with Dr. Redmond Baseman on 10/23/2021 okay to commence Lovenox for DVT prophylaxis.  Ordered.    Nutrition Status: Nutrition Problem: Moderate Malnutrition Etiology: chronic illness (floor of mouth cancer) Signs/Symptoms: mild fat depletion, moderate muscle depletion Interventions: Boost Breeze, Refer to RD note for recommendations, Tube feeding    BMI: Estimated body mass index is 24.63 kg/m as calculated from the following:   Height as of this encounter: '5\' 11"'$  (1.803 m).   Weight as of this encounter: 80.1 kg.   Code status:   Code Status: Full Code   DVT Prophylaxis: Per primary team. enoxaparin (LOVENOX) injection 40 mg Start: 10/23/21 1000 Place and maintain sequential compression device Start: 10/16/21 1321 SCDs Start: 10/14/21 1520   Family Communication:  None at bedside  Disposition Plan: Defer to primary service  Diet: Diet Order             DIET - DYS 1 Room service appropriate? No; Fluid consistency: Thin  Diet effective now                    MEDICATIONS: Scheduled Meds:  amLODipine  10 mg Oral Daily  bacitracin  1 Application Topical Y8M   carvedilol  6.25 mg Oral BID WC   Chlorhexidine Gluconate Cloth  6 each Topical Q0600   enoxaparin (LOVENOX) injection  40 mg Subcutaneous Q24H   free water  200 mL Per Tube Q6H   hydrALAZINE  100 mg Oral Q8H   mouth rinse  15 mL Mouth Rinse 4 times per day   pantoprazole  40 mg Oral BID   polyethylene glycol  17 g Oral BID   Continuous Infusions:  sodium chloride 10 mL/hr at 10/19/21 1106    PRN Meds:.sodium chloride, hydrALAZINE, HYDROcodone-acetaminophen, labetalol, metoCLOPramide (REGLAN) injection, morphine injection, ondansetron (ZOFRAN) IV, mouth rinse   I have personally reviewed following labs and imaging studies  LABORATORY DATA:  Recent Labs  Lab 10/18/21 0328 10/19/21 0216 10/20/21 0253 10/21/21 0347 10/22/21 0148  WBC 10.3 11.7* 13.1* 12.3* 11.3*  HGB 11.5* 12.3* 12.3* 11.0* 10.8*  HCT 37.0* 37.8* 39.0 35.0* 32.8*  PLT 233 249 302 295 290  MCV 103.9* 101.1* 103.4* 102.6* 100.0  MCH 32.3 32.9 32.6 32.3 32.9  MCHC 31.1 32.5 31.5 31.4 32.9  RDW 13.4 13.4 13.7 14.1 13.8  LYMPHSABS 1.0 1.2 0.9 1.3 1.3  MONOABS 1.0 1.2* 1.6* 1.4* 1.2*  EOSABS 0.1 0.1 0.1 0.3 0.5  BASOSABS 0.0 0.1 0.0 0.0 0.1    Recent Labs  Lab 10/16/21 1118 10/16/21 1118 10/16/21 1434 10/17/21 0631 10/18/21 0328 10/19/21 0216 10/20/21 0253 10/21/21 0347 10/22/21 0148  NA  --    < >  --  141 142 140 147* 140 139  K  --    < >  --  4.1 4.2 4.7 3.8 3.6 4.2  CL  --    < >  --  107 105 106 108 102 104  CO2  --    < >  --  '24 29 25 '$ 33* 29 26  GLUCOSE  --    < >  --  109* 125* 128* 128* 136* 123*  BUN  --    < >  --  12 7* '13 19 19 19  '$ CREATININE  --    < >  --  0.80 0.81 0.69 0.79 0.77 0.70  CALCIUM  --    < >  --  9.3 9.6 9.6 9.8 9.4 9.6  AST 16  --   --   --   --   --   --   --   --   ALT 9  --   --   --   --   --   --   --   --   ALKPHOS 41  --   --   --   --   --   --   --   --   BILITOT 0.9  --   --   --   --   --   --   --   --   ALBUMIN 3.2*  --   --   --   --   --   --   --   --   MG  --   --   --  2.1 2.1 2.4 2.4 2.2  --   PHOS  --   --   --  2.3* 2.5 2.1* 2.3* 2.9  --   PROCALCITON 0.17  --   --   --  <0.10 <0.10 <0.10 <0.10 <0.10  LATICACIDVEN 1.9  --   --   --   --   --   --   --   --  BNP  --   --  231.9*  --  201.3* 143.4* 122.6* 75.2  --    < > = values in this interval not displayed.     RADIOLOGY STUDIES/RESULTS: No results found.   LOS: 9 days    Signature  Lala Lund M.D on 10/23/2021 at 10:38 AM   -  To page go to [email or web address]

## 2021-10-23 NOTE — Progress Notes (Signed)
Subjective: Doing well.  Not taking a lot by mouth but SLP recommendations noted.  Objective: Vital signs in last 24 hours: Temp:  [97.5 F (36.4 C)-98.4 F (36.9 C)] 98.1 F (36.7 C) (10/12 0810) Pulse Rate:  [86-111] 95 (10/12 0810) Resp:  [16-18] 17 (10/12 0810) BP: (124-145)/(58-80) 124/58 (10/12 0810) SpO2:  [95 %-100 %] 98 % (10/12 0810) FiO2 (%):  [21 %] 21 % (10/12 0804) Wt Readings from Last 1 Encounters:  10/22/21 80.1 kg    Intake/Output from previous day: 10/11 0701 - 10/12 0700 In: -  Out: 2000 [Urine:2000] Intake/Output this shift: No intake/output data recorded.  General appearance: alert, cooperative, and no distress Throat: flap under tongue with dark skin but remains intact Neck: incision intact except for some gaping left and superior to trach site, staples removed, cuffless trach remains in place with Passy-Muir valve, good voice  Recent Labs    10/21/21 0347 10/22/21 0148  WBC 12.3* 11.3*  HGB 11.0* 10.8*  HCT 35.0* 32.8*  PLT 295 290    Recent Labs    10/21/21 0347 10/22/21 0148  NA 140 139  K 3.6 4.2  CL 102 104  CO2 29 26  GLUCOSE 136* 123*  BUN 19 19  CREATININE 0.77 0.70  CALCIUM 9.4 9.6    Medications: I have reviewed the patient's current medications.  Assessment/Plan: Floor of mouth cancer  Appreciate input from hospitalist and various therapists.  Will remove feeding tube and proceed with oral diet with calorie count.  Goal of inpatient rehab.   LOS: 9 days   Melida Quitter 10/23/2021, 8:17 AM

## 2021-10-24 LAB — GLUCOSE, CAPILLARY
Glucose-Capillary: 97 mg/dL (ref 70–99)
Glucose-Capillary: 101 mg/dL — ABNORMAL HIGH (ref 70–99)
Glucose-Capillary: 104 mg/dL — ABNORMAL HIGH (ref 70–99)
Glucose-Capillary: 121 mg/dL — ABNORMAL HIGH (ref 70–99)
Glucose-Capillary: 109 mg/dL — ABNORMAL HIGH (ref 70–99)

## 2021-10-24 NOTE — Progress Notes (Signed)
Inpatient Rehab Admissions Coordinator:   I continue to await decision on appeal for CIR. I should have a bed for him today or over the weekend IF auth is received. MD and TOC updated.  Clemens Catholic, [place], [place] Admissions Coordinator  [phone] ([place]) [phone] (office)

## 2021-10-24 NOTE — Discharge Summary (Signed)
Physician Discharge Summary  Patient ID: Nathaniel Hicks MRN: [id number] DOB/AGE: November 23, 1940 81 y.o.  Admit date: 10/14/2021 Discharge date: 10/28/2021  Admission Diagnoses: Oral cancer  Discharge Diagnoses:  Principal Problem:   Oral cancer ([place]) Active Problems:   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[place])   Malnutrition of moderate degree   Discharged Condition: good  Hospital Course: 81 year old male presented to the hospital for surgical management of floor of mouth cancer.  See operative note.  He was observed in the ICU after surgery with a new tracheostomy and drains and an NG feeding tube in place.  He had difficulty with nausea and vomiting the first couple of days that prevented starting nutrition and required replacement of the NG tube.  The hospitalist service was asked to consult.  He had a low grade fever a couple of days after surgery presumed to be related to aspiration and was started on Unasyn.  The fever and vomiting improved.  The flap under his tongue remained intact through his hospitalization but became dusky.  There was good blood flow with needle exploration.  He was transferred to a regular hospital room receiving tube feeds.  His tracheostomy tube was changed to a cuffless tube.  Drains were removed.  He completed 7 days of Unasyn.  He worked with Sport and exercise psychologist, PT, and OT.  Ultimately, his feeding tube was removed and his diet was advanced and evaluated by speech pathology.  At the time of discharge, he is tolerating an oral diet.  His tracheostomy remains in place and he is tolerating the Passy-Muir valve.  The flap under the tongue is evolving with some necrosis of the skin that may later require debridement.  He is being discharged to inpatient rehab for further recovery.  Consults:  Hospitalist, SLP, PT, OT  Significant Diagnostic Studies: None  Treatments: surgery: Floor of mouth resection, tracheostomy, bilateral neck dissections, and platysma flap  Discharge  Exam: Blood pressure 125/73, pulse 91, temperature 98.2 F (36.8 C), temperature source Oral, resp. rate 16, height '5\' 11"'$  (1.803 m), weight 75.6 kg, SpO2 99 %. General appearance: alert, cooperative, and no distress Throat: floor of mouth flap in place with dark color to skin with patches of necrosis Neck: incision intact, #6 cuffless Shiley trach in place with Passy-Muir valve  Disposition: Discharge disposition: 62-Rehab Facility     Inpatient rehab  Discharge Instructions     Diet - low sodium heart healthy   Complete by: As directed    Discharge instructions   Complete by: As directed    Routine trach care.  Diet per speech pathology.   Increase activity slowly   Complete by: As directed    No wound care   Complete by: As directed       Allergies as of 10/28/2021   No Known Allergies      Medication List     TAKE these medications    amLODipine-benazepril 10-40 MG capsule Commonly known as: LOTREL Take 1 capsule by mouth daily.   BIOFREEZE EX Apply 1 application  topically daily as needed (pain).   gabapentin 300 MG capsule Commonly known as: NEURONTIN Take 300 mg by mouth 3 (three) times daily.   hydrALAZINE 25 MG tablet Commonly known as: APRESOLINE Take 25 mg by mouth 2 (two) times daily.   ketotifen 0.025 % ophthalmic solution Commonly known as: ZADITOR Place 1 drop into both eyes daily as needed (red or itching eyes).   magnesium oxide 400 MG tablet Commonly  known as: MAG-OX Take 400 mg by mouth daily.   oxyCODONE 5 MG immediate release tablet Commonly known as: Oxy IR/ROXICODONE Take 5 mg by mouth 3 (three) times daily as needed for pain.   pantoprazole 40 MG tablet Commonly known as: Protonix Take 1 tablet (40 mg total) by mouth daily.   pravastatin 80 MG tablet Commonly known as: PRAVACHOL Take 80 mg by mouth daily.   Rapaflo 8 MG Caps capsule Generic drug: silodosin Take 8 mg by mouth daily. Silodosin   vitamin B-12 500 MCG  tablet Commonly known as: CYANOCOBALAMIN Take 500 mcg by mouth daily.   Vitamin D3 50 MCG (2000 UT) Tabs Take 2,000 Units by mouth daily.   Voltaren 1 % Gel Generic drug: diclofenac Sodium Apply 1 g topically daily as needed (Arthritis pain).        Follow-up Information     Melida Quitter, MD. Schedule an appointment as soon as possible for a visit in 2 week(s).   Specialty: Otolaryngology Contact information: [address] [place] [address] [phone]                 Signed: Melida Quitter 10/28/2021, 1:06 PM

## 2021-10-24 NOTE — Progress Notes (Signed)
Subjective: Feeling good.  No specific complaints.  Able to swallow fairly well.  Objective: Vital signs in last 24 hours: Temp:  [97.9 F (36.6 C)-98.5 F (36.9 C)] 97.9 F (36.6 C) (10/13 0849) Pulse Rate:  [71-94] 90 (10/13 0849) Resp:  [16-22] 17 (10/13 0849) BP: (114-144)/(48-67) 114/48 (10/13 0849) SpO2:  [96 %-100 %] 100 % (10/13 0849) FiO2 (%):  [21 %] 21 % (10/13 0831) Weight:  [82 kg] 82 kg (10/13 0450) Wt Readings from Last 1 Encounters:  10/24/21 82 kg    Intake/Output from previous day: 10/12 0701 - 10/13 0700 In: -  Out: 750 [Urine:750] Intake/Output this shift: Total I/O In: -  Out: 700 [Urine:700]  General appearance: alert, cooperative, and no distress Throat: floor of mouth flap intact with dark color with patches of necrotic changes, suture line intact Neck: incision intact, trach site stable with cuffless trach in place , excellent voice with Passy-Muir valve, easy breathing and voice with trach tube occluded  Recent Labs    10/22/21 0148  WBC 11.3*  HGB 10.8*  HCT 32.8*  PLT 290    Recent Labs    10/22/21 0148  NA 139  K 4.2  CL 104  CO2 26  GLUCOSE 123*  BUN 19  CREATININE 0.70  CALCIUM 9.6    Medications: I have reviewed the patient's current medications.  Assessment/Plan: Floor of mouth cancer  Flap under tongue remains intact with some evolution of skin necrosis.  He may ultimately need this debrided but will continue with flap in place for time being.  Will plan to leave tracheostomy in place until floor of mouth healing fully declared.  Currently in calorie count with goal of adequate oral diet.  Planning for inpatient rehab at discharge.   LOS: 10 days   Melida Quitter 10/24/2021, 10:09 AM

## 2021-10-24 NOTE — Progress Notes (Signed)
Passy-muir inserted per pt request, pt gently suctioned prior, SpO2 98%, 5L via trach-collar. Pt sitting upright in bed, call bell in reach

## 2021-10-24 NOTE — Progress Notes (Signed)
Patient seen today by trach team for consult.  No education is needed at this time.  All necessary equipment is at beside.   Will continue to follow for progression.  

## 2021-10-24 NOTE — TOC Progression Note (Addendum)
Transition of Care [facility]) - Progression Note    Patient Details  Name: Nathaniel Hicks MRN: [id number] Date of Birth: 04-28-1940  Transition of Care [facility]) CM/SW Contact  Jacalyn Lefevre Edson Snowball, RN Phone Number: 10/24/2021, 10:48 AM  Clinical Narrative:     Secure chatted CIR to see if there are any updates on appeal. Await response.  CIR expecting a decision today or tomorrow     Barriers to Discharge: Continued Medical Work up  Expected Discharge Plan and Services     Discharge Planning Services: CM Consult   Living arrangements for the past 2 months: Apartment                                       Social Determinants of Health (SDOH) Interventions    Readmission Risk Interventions     No data to display

## 2021-10-24 NOTE — Progress Notes (Signed)
Mobility Specialist - Progress Note   10/24/21 1408  Mobility  Activity Dangled on edge of bed  Level of Assistance Minimal assist, patient does 75% or more  Assistive Device None  Distance Ambulated (ft) 0 ft  Activity Response Tolerated well  $Mobility charge 1 Mobility    Pt received in bed, lunch tray on bedside table. Agreed to move to EOB to eat lunch, declining further mobility until meal is finished. Pt left sitting EOB w/ call bell in reach and all needs met.   Paulla Dolly Mobility Specialist

## 2021-10-24 NOTE — Progress Notes (Signed)
Calorie Count Note  48 hour calorie count ordered.  Diet: DYS 1 Supplements: Ensure Enlive TID  Reviewed intake over the last 24 hours. Without nutrition from supplements, pt is not meeting needs. However, able to meet his needs by drinking 3 ensures each day. Will continue to encourage PO intake and supplements in order to meet needs. Will monitor intake over the weekend and dc kcal count on Monday.  Estimated Nutritional Needs:  Kcal:  1800-2000 Protein:  90-110 grams Fluid:  >2.0 L  10/12 Breakfast: 260 kcal, 22g of protein 10/12 Lunch: 250 kcal, 14g protein 10/12 Dinner: 199kcal, 22g protein Supplements: Ensure Enlive x 2 (700kcal, 40g protein)  Total intake: 709 kcal (39% of minimum estimated needs)  58 protein (64% of minimum estimated needs) With supplements:1409kcal (78%), 98g protein (>100%) of needs  NUTRITION DIAGNOSIS:  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   GOAL:   Patient will meet greater than or equal to 90% of their needs   Intervention: Continue current diet as ordered, encourage PO intake Nursing to assist with tray set-up Ensure Enlive po TID, each supplement provides 350 kcal and 20 grams of protein.   Ranell Patrick, RD, LDN Clinical Dietitian RD pager # available in [place]  After hours/weekend pager # available in [facility]

## 2021-10-25 LAB — GLUCOSE, CAPILLARY
Glucose-Capillary: 105 mg/dL — ABNORMAL HIGH (ref 70–99)
Glucose-Capillary: 108 mg/dL — ABNORMAL HIGH (ref 70–99)
Glucose-Capillary: 108 mg/dL — ABNORMAL HIGH (ref 70–99)
Glucose-Capillary: 110 mg/dL — ABNORMAL HIGH (ref 70–99)
Glucose-Capillary: 112 mg/dL — ABNORMAL HIGH (ref 70–99)
Glucose-Capillary: 96 mg/dL (ref 70–99)

## 2021-10-25 MED ORDER — ORAL CARE MOUTH RINSE: 15.0000 mL | OROMUCOSAL | Status: DC | PRN

## 2021-10-25 MED ORDER — ORAL CARE MOUTH RINSE
15.0000 mL | OROMUCOSAL | Status: DC
  Administered 2021-10-26 – 2021-10-28 (×10): 15 mL via OROMUCOSAL

## 2021-10-25 NOTE — Progress Notes (Signed)
Subjective: Seen and examined at bedsides. States he is feeling well, tolerating diet without difficulty. Worked with OT earlier today.  Objective: Vital signs in last 24 hours: Temp:  [97.3 F (36.3 C)-98.9 F (37.2 C)] 98.2 F (36.8 C) (10/14 0818) Pulse Rate:  [82-99] 82 (10/14 0818) Resp:  [16-20] 18 (10/14 0818) BP: (98-129)/(54-70) 129/63 (10/14 0818) SpO2:  [95 %-100 %] 100 % (10/14 0819) FiO2 (%):  [21 %] 21 % (10/14 0819) Weight:  [78 kg] 78 kg (10/14 0500) Wt Readings from Last 1 Encounters:  10/25/21 78 kg    Intake/Output from previous day: 10/13 0701 - 10/14 0700 In: 300 [P.O.:300] Out: 1500 [Urine:1500] Intake/Output this shift: Total I/O In: 220 [P.O.:220] Out: 600 [Urine:600]  General appearance: alert, cooperative, and no distress Throat: floor of mouth flap intact with dark color with patches of necrotic changes, suture line intact Neck: incision intact, trach site stable with cuffless trach in place , excellent voice with Passy-Muir valve, easy breathing and voice with trach tube occluded   Medications: I have reviewed the patient's current medications.  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

## 2021-10-25 NOTE — Progress Notes (Signed)
Occupational Therapy Treatment Patient Details Name: Nathaniel Hicks MRN: [id number] DOB: 03/19/1940 Today's Date: 10/25/2021   History of present illness 81 y.o. male admitted on 10/14/21 for surgical management of mouth CA.  Pt s/p floor of mouth resection, neck dissection bil, platysma flap clousre, trach and dental extractions.  Pt with significant PMH of HOH, HTN, testicular CA, R knee arthroscopy with lateral minesectomy.   OT comments  Pt. Seen for skilled OT session. Focus of session was completion of LB dressing utilizing energy conservation strategies.  Bed mobility in/out of bed with min guard a.  Reports sitting up for meals in chair and requests to stay in bed at end of session until lunch and he will get back up again.  Remains strong candidate for AIR to achieve increases safety and independence with mobility, and ADLS.     Recommendations for follow up therapy are one component of a multi-disciplinary discharge planning process, led by the attending physician.  Recommendations may be updated based on patient status, additional functional criteria and insurance authorization.    Follow Up Recommendations  Acute inpatient rehab (3hours/day)    Assistance Recommended at Discharge Frequent or constant Supervision/Assistance  Patient can return home with the following  Assistance with cooking/housework   Equipment Recommendations  Tub/shower seat    Recommendations for Other Services      Precautions / Restrictions Precautions Precautions: Fall;Other (comment) Precaution Comments: trach, feeding tube (NGT) Other Brace: PMV during therapy Restrictions Weight Bearing Restrictions: No       Mobility Bed Mobility Overal bed mobility: Needs Assistance Bed Mobility: Sit to Supine     Supine to sit: Supervision, HOB elevated Sit to supine: Min guard   General bed mobility comments: Increased time and Min Guard A for safety    Transfers                    General transfer comment: pt. declined oob as he had just returned to bed, reports he will get back up for lunch to the chair     Balance                                           ADL either performed or assessed with clinical judgement   ADL Overall ADL's : Needs assistance/impaired                     Lower Body Dressing: Minimal assistance;Sitting/lateral leans Lower Body Dressing Details (indicate cue type and reason): education provided on energy conservation and safety related to sit/stands. reviewed pre loading lb garments prior to standing               General ADL Comments: pt. had just returned to bed from being up in the chair for b.fast and requested to remain bed level but reports he will get back to the chair for lunch    Extremity/Trunk Assessment              Vision       Perception     Praxis      Cognition Arousal/Alertness: Awake/alert Behavior During Therapy: [facility] for tasks assessed/performed Overall Cognitive Status: Within Functional Limits for tasks assessed  Exercises      Shoulder Instructions       General Comments  Worked as an Cabin crew    Pertinent Vitals/ Pain       Pain Assessment Pain Assessment: No/denies pain  Home Living                                          Prior Functioning/Environment              Frequency  Min 3X/week        Progress Toward Goals  OT Goals(current goals can now be found in the care plan section)  Progress towards OT goals: Progressing toward goals     Plan Discharge plan remains appropriate    Co-evaluation                 AM-PAC OT "6 Clicks" Daily Activity     Outcome Measure   Help from another person eating meals?: A Little Help from another person taking care of personal grooming?: A Little Help from another person toileting, which includes using toliet,  bedpan, or urinal?: A Little Help from another person bathing (including washing, rinsing, drying)?: A Little Help from another person to put on and taking off regular upper body clothing?: A Little Help from another person to put on and taking off regular lower body clothing?: A Little 6 Click Score: 18    End of Session Equipment Utilized During Treatment: Oxygen  OT Visit Diagnosis: Unsteadiness on feet (R26.81);Other abnormalities of gait and mobility (R26.89);Muscle weakness (generalized) (M62.81)   Activity Tolerance Patient tolerated treatment well   Patient Left in bed;with call bell/phone within reach;with bed alarm set   Nurse Communication Other (comment) (alerted rn to please check if bed alarm was in the on position if needed for this pt.)        Time: 4034-7425 OT Time Calculation (min): 8 min  Charges: OT General Charges $OT Visit: 1 Visit OT Treatments $Self Care/Home Management : 8-22 mins  Nathaniel Hicks, COTA/L Acute Rehabilitation [phone]   Nathaniel Hicks-COTA/L 10/25/2021, 11:59 AM

## 2021-10-26 LAB — GLUCOSE, CAPILLARY
Glucose-Capillary: 105 mg/dL — ABNORMAL HIGH (ref 70–99)
Glucose-Capillary: 106 mg/dL — ABNORMAL HIGH (ref 70–99)
Glucose-Capillary: 107 mg/dL — ABNORMAL HIGH (ref 70–99)
Glucose-Capillary: 111 mg/dL — ABNORMAL HIGH (ref 70–99)
Glucose-Capillary: 112 mg/dL — ABNORMAL HIGH (ref 70–99)
Glucose-Capillary: 113 mg/dL — ABNORMAL HIGH (ref 70–99)
Glucose-Capillary: 128 mg/dL — ABNORMAL HIGH (ref 70–99)

## 2021-10-26 NOTE — Progress Notes (Signed)
Subjective: Seen and examined, family member at bedside. No issues, reports good appetite, tolerating PO.  Objective: Vital signs in last 24 hours: Temp:  [97.5 F (36.4 C)-98.6 F (37 C)] 97.5 F (36.4 C) (10/15 1538) Pulse Rate:  [68-97] 86 (10/15 1630) Resp:  [15-18] 16 (10/15 1630) BP: (118-142)/(56-99) 118/56 (10/15 1538) SpO2:  [97 %-100 %] 97 % (10/15 1630) FiO2 (%):  [21 %] 21 % (10/15 1630) Weight:  [78.3 kg] 78.3 kg (10/15 0320) Wt Readings from Last 1 Encounters:  10/26/21 78.3 kg    Intake/Output from previous day: 10/14 0701 - 10/15 0700 In: 320 [P.O.:320] Out: 1700 [Urine:1700] Intake/Output this shift: Total I/O In: 180 [P.O.:180] Out: 200 [Urine:200]  General appearance: alert, cooperative, and no distress Throat: floor of mouth flap intact, dark color noted, suture line intact Neck: incision intact, trach site stable with cuffless trach in place , excellent voice with Passy-Muir valve, easy breathing and voice with trach tube occluded   Medications: I have reviewed the patient's current medications.  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

## 2021-10-26 NOTE — Plan of Care (Signed)
  Problem: Health Behavior/Discharge Planning: Goal: Ability to manage health-related needs will improve Outcome: Progressing   

## 2021-10-27 LAB — GLUCOSE, CAPILLARY
Glucose-Capillary: 96 mg/dL (ref 70–99)
Glucose-Capillary: 100 mg/dL — ABNORMAL HIGH (ref 70–99)
Glucose-Capillary: 105 mg/dL — ABNORMAL HIGH (ref 70–99)
Glucose-Capillary: 110 mg/dL — ABNORMAL HIGH (ref 70–99)
Glucose-Capillary: 111 mg/dL — ABNORMAL HIGH (ref 70–99)

## 2021-10-27 MED ORDER — BOOST PLUS PO LIQD
237.0000 mL | Freq: Three times a day (TID) | ORAL | Status: DC
  Administered 2021-10-28 (×2): 237 mL via ORAL
  Filled 2021-10-27 (×4): qty 237

## 2021-10-27 NOTE — Progress Notes (Signed)
Speech Language Pathology Treatment: Dysphagia;Passy Muir Speaking valve  Patient Details Name: Nathaniel Hicks MRN: [id number] DOB: January 17, 1940 Today's Date: 10/27/2021 Time: 1050-1104 SLP Time Calculation (min) (ACUTE ONLY): 14 min  Assessment / Plan / Recommendation Clinical Impression  Pt demonstrates ongoing progress with dysphagia strategies and independence with PMSV. Pt reports eating and drinking well. He was able to verbalize need for liquid wash and upright position. Agrees that thicker solids are more difficult and he would not be ready for any kind of chewing at this time. We discussed the importance of supplements, but pt stated he hasn't had ensure in a few days because they weren't stocked. Provided a Boost. Pt is able to remove his PMSV, but not place it due to sensory issues and stiffness in his fingers and relies on staff for this. He is more intelligible today, revision needed in less than 25% of phrases. Would benefit from f/u to target dysarthria and dysphagia in AIR.   HPI HPI: Patient is a 81 y.o.  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 On October 3, pt underwent floor of mouth resection, 5 x 5 cm, Tracheostomy, Platysma flap closure, dental extractions #22-27, bilateral selective neck dissections zones 1-3. Subsequently admitted to the ICU for close monitoring-postoperative course complicated by nausea/vomiting requiring NG tube insertion.      SLP Plan         Recommendations for follow up therapy are one component of a multi-disciplinary discharge planning process, led by the attending physician.  Recommendations may be updated based on patient status, additional functional criteria and insurance authorization.    Recommendations  Diet recommendations: Dysphagia 1 (puree);Thin liquid Liquids provided via: Cup Medication Administration: Crushed with puree Supervision: Patient able to self  feed Compensations: Slow rate;Small sips/bites;Follow solids with liquid Postural Changes and/or Swallow Maneuvers: Seated upright 90 degrees      Patient may use Passy-Muir Speech Valve: During all therapies with supervision;During all waking hours (remove during sleep);During PO intake/meals         Oral Care Recommendations: Oral care BID Follow Up Recommendations: Acute inpatient rehab (3hours/day) Assistance recommended at discharge: Intermittent Supervision/Assistance SLP Visit Diagnosis: Dysphagia, oral phase (R13.11)           [name], Katherene Ponto  10/27/2021, 11:11 AM

## 2021-10-27 NOTE — Progress Notes (Signed)
Calorie Count Note  48 hour calorie count ordered.  Diet: DYS 1 Supplements: Ensure Enlive TID  Reviewed intake from the weekend. Meal intake is consistently meeting ~50% of needs but with supplements pt is meeting needs. Will continue current nutrition plan for now.  Estimated Nutritional Needs:  Kcal:  1800-2000 Protein:  90-110 grams Fluid:  >2.0 L  10/13 Breakfast: 235 kcal, 7g of protein 10/13 Lunch: 434 kcal, 26g protein 10/13 Dinner: 265kcal, 20g protein Supplements: Ensure Enlive x 3 (1050kcal, 60g protein)  10/13 Total intake: 934 kcal (52% of minimum estimated needs)  53 protein (59% of minimum estimated needs) With supplements:1984kcal (>100%), 113g protein (>100%) of needs  10/14 Breakfast: Not documented 10/14 Lunch: 290 kcal, 7g protein 10/14 Dinner: 385kcal, 7g protein Supplements: Ensure Enlive x 3 (1050kcal, 60g protein)  10/14 Total intake (breakfast not recorded): 675 kcal (38% of minimum estimated needs)  14 protein (59% of minimum estimated needs) With supplements:1725 kcal (96%), 74g protein (82%) of needs  NUTRITION DIAGNOSIS:  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   GOAL:   Patient will meet greater than or equal to 90% of their needs   Intervention: Continue current diet as ordered, encourage PO intake Continue Magic cup TID with meals, each supplement provides 290 kcal and 9 grams of protein Nursing to assist with tray set-up Ensure Enlive po TID, each supplement provides 350 kcal and 20 grams of protein.   Ranell Patrick, RD, LDN Clinical Dietitian RD pager # available in [place]  After hours/weekend pager # available in [facility]

## 2021-10-27 NOTE — Progress Notes (Signed)
Occupational Therapy Treatment Patient Details Name: Nathaniel Hicks MRN: [id number] DOB: January 23, 1940 Today's Date: 10/27/2021   History of present illness 81 y.o. male admitted on 10/14/21 for surgical management of mouth CA.  Pt s/p floor of mouth resection, neck dissection bil, platysma flap clousre, trach and dental extractions.  Pt with significant PMH of HOH, HTN, testicular CA, R knee arthroscopy with lateral minesectomy.   OT comments  Pt making steady progress towards OT goals this session. Pt presents today with reports of general malaise but requesting to get OOB to eat breakfast . Session focus on continued functional mobility progression and OOB tolerance for potential DC to AIR. Pt currently requires MIN A for ADL transfers with RW, min guard for LB ADLs and and set- up assist for self feeding. Pt would continue to benefit from skilled occupational therapy while admitted and after d/c to address the below listed limitations in order to improve overall functional mobility and facilitate independence with BADL participation. DC plan remains appropriate, will follow acutely per POC.      Recommendations for follow up therapy are one component of a multi-disciplinary discharge planning process, led by the attending physician.  Recommendations may be updated based on patient status, additional functional criteria and insurance authorization.    Follow Up Recommendations  Acute inpatient rehab (3hours/day)    Assistance Recommended at Discharge Frequent or constant Supervision/Assistance  Patient can return home with the following  Assistance with cooking/housework   Equipment Recommendations  Tub/shower seat    Recommendations for Other Services      Precautions / Restrictions Precautions Precautions: Fall;Other (comment) Precaution Comments: trach Other Brace: PMV during therapy Restrictions Weight Bearing Restrictions: No       Mobility Bed Mobility Overal bed mobility:  Needs Assistance Bed Mobility: Supine to Sit     Supine to sit: Min guard, HOB elevated     General bed mobility comments: light min guard mostly to manage lines    Transfers Overall transfer level: Needs assistance Equipment used: Rolling walker (2 wheels) Transfers: Sit to/from Stand, Bed to chair/wheelchair/BSC Sit to Stand: Min assist Stand pivot transfers: Min assist         General transfer comment: pt stood from EOB with MINA to rise, MINA  to pivot with Rw for balance     Balance Overall balance assessment: Needs assistance Sitting-balance support: No upper extremity supported, Feet supported Sitting balance-Leahy Scale: Fair     Standing balance support: Bilateral upper extremity supported, During functional activity, Reliant on assistive device for balance Standing balance-Leahy Scale: Poor Standing balance comment: reliant on BUE support and AD                           ADL either performed or assessed with clinical judgement   ADL Overall ADL's : Needs assistance/impaired Eating/Feeding: Set up;Sitting   Grooming: Wash/dry hands;Sitting;Supervision/safety;Set up Grooming Details (indicate cue type and reason): sitting in recliner     Lower Body Bathing: Min guard;Sitting/lateral leans Lower Body Bathing Details (indicate cue type and reason): simulated via LB dressing     Lower Body Dressing: Min guard;Sitting/lateral leans Lower Body Dressing Details (indicate cue type and reason): to adjust socks froom EOB Toilet Transfer: Minimal assistance;Stand-pivot;Rolling walker (2 wheels) Toilet Transfer Details (indicate cue type and reason): simulated via stand pivot from EOB>recliner with RW         Functional mobility during ADLs: Minimal assistance;Rolling walker (2 wheels) General ADL  Comments: ADL participation impacted by decreased activity tolerance, impaired balance and general malaise today    Extremity/Trunk Assessment Upper  Extremity Assessment Upper Extremity Assessment: Generalized weakness   Lower Extremity Assessment Lower Extremity Assessment: Defer to PT evaluation        Vision Baseline Vision/History: 0 No visual deficits     Perception Perception Perception: Within Functional Limits   Praxis Praxis Praxis: Intact    Cognition Arousal/Alertness: Awake/alert Behavior During Therapy: WFL for tasks assessed/performed Overall Cognitive Status: Within Functional Limits for tasks assessed                                          Exercises      Shoulder Instructions       General Comments pt on 5 LPM 28% SpO2 99%, PMV already donned during session    Pertinent Vitals/ Pain       Pain Assessment Pain Assessment: No/denies pain  Home Living                                          Prior Functioning/Environment              Frequency  Min 3X/week        Progress Toward Goals  OT Goals(current goals can now be found in the care plan section)  Progress towards OT goals: Progressing toward goals  Acute Rehab OT Goals Patient Stated Goal: to get OOB OT Goal Formulation: With patient Time For Goal Achievement: 11/01/21 Potential to Achieve Goals: Good  Plan Discharge plan remains appropriate;Frequency remains appropriate    Co-evaluation                 AM-PAC OT "6 Clicks" Daily Activity     Outcome Measure   Help from another person eating meals?: A Little Help from another person taking care of personal grooming?: A Little Help from another person toileting, which includes using toliet, bedpan, or urinal?: A Little Help from another person bathing (including washing, rinsing, drying)?: A Little Help from another person to put on and taking off regular upper body clothing?: A Little Help from another person to put on and taking off regular lower body clothing?: A Little 6 Click Score: 18    End of Session Equipment  Utilized During Treatment: Gait belt;Rolling walker (2 wheels);Oxygen  OT Visit Diagnosis: Unsteadiness on feet (R26.81);Other abnormalities of gait and mobility (R26.89);Muscle weakness (generalized) (M62.81)   Activity Tolerance Patient tolerated treatment well   Patient Left in chair;with call bell/phone within reach;with chair alarm set   Nurse Communication Mobility status;Other (comment) (Nt present during session)        Time: 0822-0833 OT Time Calculation (min): 11 min  Charges: OT General Charges $OT Visit: 1 Visit OT Treatments $Self Care/Home Management : 8-22 mins  Harley Alto., COTA/L Acute Rehabilitation Services [phone]   Precious Haws 10/27/2021, 8:52 AM

## 2021-10-27 NOTE — Progress Notes (Signed)
Inpatient Rehab Admissions Coordinator:    I continue to await decision on insurance appeal for CIR. I was contacted by his insurance with request for updated clinicals. I will send once PT has seen Pt.   Clemens Catholic, [place], [place] Admissions Coordinator  [phone] ([place]) [phone] (office)

## 2021-10-27 NOTE — Progress Notes (Signed)
Notified by tele of 7 beat run VT. Pt is asymptomatic and at rest. Dr Constance Holster notified, no new orders at this time.

## 2021-10-27 NOTE — Plan of Care (Signed)
  Problem: Health Behavior/Discharge Planning: Goal: Ability to manage health-related needs will improve Outcome: Progressing   

## 2021-10-27 NOTE — Progress Notes (Signed)
Subjective: Doing well with no complaints.  Tolerating oral diet well.  Objective: Vital signs in last 24 hours: Temp:  [97.5 F (36.4 C)-98.6 F (37 C)] 97.5 F (36.4 C) (10/16 1607) Pulse Rate:  [68-88] 88 (10/16 1607) Resp:  [16-20] 16 (10/16 1607) BP: (122-142)/(55-93) 122/81 (10/16 1607) SpO2:  [98 %-100 %] 100 % (10/16 1607) FiO2 (%):  [21 %] 21 % (10/16 0730) Weight:  [77 kg] 77 kg (10/16 0434) Wt Readings from Last 1 Encounters:  10/27/21 77 kg    Intake/Output from previous day: 10/15 0701 - 10/16 0700 In: 280 [P.O.:280] Out: 2000 [Urine:2000] Intake/Output this shift: Total I/O In: 240 [P.O.:240] Out: 400 [Urine:400]  General appearance: alert, cooperative, and no distress Throat: flap under tongue with areas of pink emerging in middle of flap, evolving superficial necrosis, periphery intact Neck: incision intact, cuffless trach in place with Passy-Muir valve  No results for input(s): "WBC", "HGB", "HCT", "PLT" in the last 72 hours.  No results for input(s): "NA", "K", "CL", "CO2", "GLUCOSE", "BUN", "CREATININE", "CALCIUM" in the last 72 hours.  Medications: I have reviewed the patient's current medications.  Assessment/Plan: Floor of mouth cancer  Looking good.  Doing well with oral diet.  Continue to wait on decision regarding inpatient rehab.   LOS: 13 days   Melida Quitter 10/27/2021, 6:39 PM

## 2021-10-27 NOTE — Progress Notes (Signed)
Physical Therapy Treatment Patient Details Name: Nathaniel Hicks MRN: [id number] DOB: 07/20/1940 Today's Date: 10/27/2021   History of Present Illness 81 y.o. male admitted on 10/14/21 for surgical management of mouth CA.  Pt s/p floor of mouth resection, neck dissection bil, platysma flap clousre, trach and dental extractions.  Pt with significant PMH of HOH, HTN, testicular CA, R knee arthroscopy with lateral minesectomy.    PT Comments    Pt in bed on entry agreeable to working with therapy. Pt with complaints of L upper trap pain, noted to have trigger points and STM employed. Focus of session static and dynamic balance. Pt requires modA for dynamic balance activities and perturbations. Pt able to ambulate in hallway without AD requiring mod-maxA for 3x LoB. D/c plans for pt remain appropriate. PT will continue to follow acutely.    Recommendations for follow up therapy are one component of a multi-disciplinary discharge planning process, led by the attending physician.  Recommendations may be updated based on patient status, additional functional criteria and insurance authorization.  Follow Up Recommendations  Acute inpatient rehab (3hours/day)     Assistance Recommended at Discharge Frequent or constant Supervision/Assistance  Patient can return home with the following A little help with walking and/or transfers;A little help with bathing/dressing/bathroom;Assistance with cooking/housework;Assist for transportation;Help with stairs or ramp for entrance   Equipment Recommendations  BSC/3in1;Rolling walker (2 wheels)    Recommendations for Other Services       Precautions / Restrictions Precautions Precautions: Fall;Other (comment) Precaution Comments: trach, feeding tube (NGT) Other Brace: PMV during therapy Restrictions Weight Bearing Restrictions: No     Mobility  Bed Mobility Overal bed mobility: Needs Assistance Bed Mobility: Supine to Sit, Sit to Supine     Supine  to sit: Supervision, HOB elevated Sit to supine: Min guard   General bed mobility comments: increased time and effort, and use of bed rail to pull to EoB, increased effort to get LE back into bed    Transfers Overall transfer level: Needs assistance Equipment used: Rolling walker (2 wheels), None Transfers: Sit to/from Stand Sit to Stand: Min assist           General transfer comment: min A for power up to RW, minA and increased use of LE posteriorly on side of bed to find balance    Ambulation/Gait Ambulation/Gait assistance: Min assist, Mod assist Gait Distance (Feet): 50 Feet Assistive device: Rolling walker (2 wheels) Gait Pattern/deviations: Step-through pattern, Decreased stride length, Shuffle Gait velocity: decreased Gait velocity interpretation: <1.31 ft/sec, indicative of household ambulator Pre-gait activities: marching in place with and without UE support General Gait Details: min A for RW use, increased shuffling, prt requiring minA for ambulation without AD, however experienced 3x loss of balance requiring mod-maxA for steadying     Balance Overall balance assessment: Needs assistance Sitting-balance support: No upper extremity supported, Feet supported Sitting balance-Leahy Scale: Fair     Standing balance support: Bilateral upper extremity supported, During functional activity, Reliant on assistive device for balance Standing balance-Leahy Scale: Fair Standing balance comment: able to static stand without outside assist can perform reaching outside BoS however cannot accept perturbation without requring assist             High level balance activites: Backward walking, Head turns, Side stepping High Level Balance Comments: pt worked on static balance reaching and stepping outside BoS, pt able to perform reaching exercise however requires assist with stepping outside BoS, pt with LoB with backwards walking without AD,  and light min A for sidestepping             Cognition Arousal/Alertness: Awake/alert Behavior During Therapy: WFL for tasks assessed/performed Overall Cognitive Status: Impaired/Different from baseline Area of Impairment: Problem solving                             Problem Solving: Slow processing, Difficulty sequencing General Comments: Requiring increased time for processing.           General Comments General comments (skin integrity, edema, etc.): pt on 5 LPM 28% SpO2 99%, PMV already donned during session      Pertinent Vitals/Pain Pain Assessment Pain Assessment: Faces Faces Pain Scale: Hurts little more Pain Location: L upper trap Pain Descriptors / Indicators: Discomfort, Tender, Tightness, Aching Pain Intervention(s): Limited activity within patient's tolerance, Monitored during session, Repositioned, Other (comment) (STM applied to trigger points)     PT Goals (current goals can now be found in the care plan section) Acute Rehab PT Goals PT Goal Formulation: With patient Time For Goal Achievement: 11/01/21 Potential to Achieve Goals: Good Progress towards PT goals: Progressing toward goals    Frequency    Min 3X/week      PT Plan Current plan remains appropriate       AM-PAC PT "6 Clicks" Mobility   Outcome Measure  Help needed turning from your back to your side while in a flat bed without using bedrails?: A Little Help needed moving from lying on your back to sitting on the side of a flat bed without using bedrails?: A Little Help needed moving to and from a bed to a chair (including a wheelchair)?: A Little Help needed standing up from a chair using your arms (e.g., wheelchair or bedside chair)?: A Little Help needed to walk in hospital room?: A Little Help needed climbing 3-5 steps with a railing? : A Lot 6 Click Score: 17    End of Session Equipment Utilized During Treatment: Gait belt Activity Tolerance: Patient tolerated treatment well Patient left: in bed;with  call bell/phone within reach;with bed alarm set Nurse Communication: Mobility status PT Visit Diagnosis: Muscle weakness (generalized) (M62.81);Unsteadiness on feet (R26.81)     Time: 9629-5284 PT Time Calculation (min) (ACUTE ONLY): 27 min  Charges:  $Gait Training: 8-22 mins $Therapeutic Exercise: 8-22 mins                     Tannis Burstein B. Migdalia Dk PT, DPT Acute Rehabilitation Services Please use secure chat or  Call Office [phone]    [place] 10/27/2021, 11:58 AM

## 2021-10-27 NOTE — Progress Notes (Signed)
Nutrition Follow-up  DOCUMENTATION CODES:  Non-severe (moderate) malnutrition in context of chronic illness  INTERVENTION:  Continue current diet as ordered Nursing to assist with set-up  Continue Ensure Enlive po TID, each supplement provides 350 kcal and 20 grams of protein. 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 - remains applicable  GOAL:  Patient will meet greater than or equal to 90% of their needs -progressing, being met with diet and supplements  MONITOR:  PO intake, Supplement acceptance, Diet advancement, Labs  REASON FOR ASSESSMENT:  Consult Enteral/tube feeding initiation and management  ASSESSMENT:  81 year old male who presented on 10/03 for floor of mouth resection, neck dissection, platysma flap closure, tracheostomy, and dental extractions. PMH of floor of mouth cancer, arthritis, BPH, HOH, HLD, HTN, testicular cancer, vitamin D deficiency.  10/03 - s/p floor of mouth resection, tracheostomy, platysma flap closure, dental extractions, bilateral selective neck dissections, small-bore NG tube placement 10/04 - multiple episodes of projectile vomiting which caused small-bore NG tube to become dislodged, NG tube placed for decompression 10/05 - NG tube became dislodged and was removed, pt refusing NG tube reinsertion 10/06 - NG tube replaced (tip post-pyloric) 10/09 - diet advanced to clear liquids, NG tube exchanged for Cortrak and bridle 10/10 - cortrak tube pulled  Pt resting in bedside chair at the time of assessment. Breakfast tray at bedside well consumed, pt states he has not received lunch yet. Boost Breeze at bedside, pt states that he is sipping on it, but  that he likes the ensure shakes better. None preesnt on unit. Will order Boost Plus to be given if ensure enlive is not available.   Kcal completed. If pt consuming his nutrition supplements that are ordered, he is meeting needs. Consuming ~50% of each meal.  Also noted that pt still has q4h CBGs ordered, which is part of the TF order set. Will reach out to provider to see if pt can have checks adjusted to with meals and before bed.  Average Meal Intake: 10/9-10/15: 51% intake x 5 recorded meals  Nutritionally Relevant Medications: Scheduled Meds:  Ensure Enlive  237 mL Oral TID BM   pantoprazole  40 mg Oral BID   polyethylene glycol  17 g Oral BID   PRN Meds: metoCLOPramide, ondansetron  Labs Reviewed  Diet Order:   Diet Order             DIET - DYS 1 Room service appropriate? No; Fluid consistency: Thin  Diet effective now                   EDUCATION NEEDS:  Not appropriate for education at this time  Skin:  Skin Assessment: Skin Integrity Issues: Incisions: neck  Last BM:  10/15 - type 1  Height:  Ht Readings from Last 1 Encounters:  10/14/21 _0  (1.803 m)    Weight:  Wt Readings from Last 1 Encounters:  10/27/21 77 kg    BMI:  Body mass index is 23.68 kg/m.  Estimated Nutritional Needs:  Kcal:  1800-2000 kcal/d Protein:  90-100 g/d Fluid:  >2.0 L    Ranell Patrick, RD, LDN Clinical Dietitian RD pager # available in [facility]  After hours/weekend pager # available in [facility]

## 2021-10-28 ENCOUNTER — Inpatient Hospital Stay (HOSPITAL_COMMUNITY)
Admission: RE | Admit: 2021-10-28 | Discharge: 2021-11-06 | DRG: 945 | Disposition: A | Discharge status: Home / Self Care | Payer: Medicare Other | Source: Intra-hospital | Attending: Physical Medicine & Rehabilitation | Admitting: Physical Medicine & Rehabilitation

## 2021-10-28 DIAGNOSIS — Z930 Tracheostomy status: Secondary | ICD-10-CM | POA: Diagnosis not present

## 2021-10-28 DIAGNOSIS — Z79899 Other long term (current) drug therapy: Secondary | ICD-10-CM

## 2021-10-28 DIAGNOSIS — C069 Malignant neoplasm of mouth, unspecified: Secondary | ICD-10-CM | POA: Diagnosis not present

## 2021-10-28 DIAGNOSIS — E785 Hyperlipidemia, unspecified: Secondary | ICD-10-CM | POA: Diagnosis present

## 2021-10-28 DIAGNOSIS — C049 Malignant neoplasm of floor of mouth, unspecified: Secondary | ICD-10-CM | POA: Diagnosis not present

## 2021-10-28 DIAGNOSIS — E440 Moderate protein-calorie malnutrition: Secondary | ICD-10-CM | POA: Diagnosis present

## 2021-10-28 DIAGNOSIS — R471 Dysarthria and anarthria: Secondary | ICD-10-CM | POA: Diagnosis not present

## 2021-10-28 DIAGNOSIS — Z87891 Personal history of nicotine dependence: Secondary | ICD-10-CM

## 2021-10-28 DIAGNOSIS — Z809 Family history of malignant neoplasm, unspecified: Secondary | ICD-10-CM

## 2021-10-28 DIAGNOSIS — D72829 Elevated white blood cell count, unspecified: Secondary | ICD-10-CM | POA: Diagnosis not present

## 2021-10-28 DIAGNOSIS — I10 Essential (primary) hypertension: Secondary | ICD-10-CM | POA: Diagnosis present

## 2021-10-28 DIAGNOSIS — Z85819 Personal history of malignant neoplasm of unspecified site of lip, oral cavity, and pharynx: Secondary | ICD-10-CM | POA: Diagnosis not present

## 2021-10-28 DIAGNOSIS — R5381 Other malaise: Principal | ICD-10-CM | POA: Diagnosis present

## 2021-10-28 DIAGNOSIS — D62 Acute posthemorrhagic anemia: Secondary | ICD-10-CM | POA: Diagnosis present

## 2021-10-28 DIAGNOSIS — R1311 Dysphagia, oral phase: Secondary | ICD-10-CM | POA: Diagnosis not present

## 2021-10-28 DIAGNOSIS — H9190 Unspecified hearing loss, unspecified ear: Secondary | ICD-10-CM | POA: Diagnosis present

## 2021-10-28 DIAGNOSIS — Z6823 Body mass index (BMI) 23.0-23.9, adult: Secondary | ICD-10-CM | POA: Diagnosis not present

## 2021-10-28 DIAGNOSIS — Z8547 Personal history of malignant neoplasm of testis: Secondary | ICD-10-CM | POA: Diagnosis not present

## 2021-10-28 DIAGNOSIS — E8809 Other disorders of plasma-protein metabolism, not elsewhere classified: Secondary | ICD-10-CM | POA: Diagnosis not present

## 2021-10-28 LAB — GLUCOSE, CAPILLARY
Glucose-Capillary: 102 mg/dL — ABNORMAL HIGH (ref 70–99)
Glucose-Capillary: 102 mg/dL — ABNORMAL HIGH (ref 70–99)
Glucose-Capillary: 126 mg/dL — ABNORMAL HIGH (ref 70–99)
Glucose-Capillary: 95 mg/dL (ref 70–99)

## 2021-10-28 MED ORDER — GUAIFENESIN-DM 100-10 MG/5ML PO SYRP
5.0000 mL | ORAL_SOLUTION | Freq: Four times a day (QID) | ORAL | Status: DC | PRN
  Administered 2021-11-04: 10 mL via ORAL
  Filled 2021-10-28 (×2): qty 10

## 2021-10-28 MED ORDER — POLYETHYLENE GLYCOL 3350 17 G PO PACK
17.0000 g | PACK | Freq: Every day | ORAL | Status: DC | PRN
  Administered 2021-10-31: 17 g via ORAL

## 2021-10-28 MED ORDER — POLYETHYLENE GLYCOL 3350 17 G PO PACK
17.0000 g | PACK | Freq: Two times a day (BID) | ORAL | Status: DC
  Administered 2021-10-28 – 2021-11-05 (×17): 17 g via ORAL
  Filled 2021-10-28 (×18): qty 1

## 2021-10-28 MED ORDER — ORAL CARE MOUTH RINSE
15.0000 mL | OROMUCOSAL | Status: DC
  Administered 2021-10-28 – 2021-11-06 (×28): 15 mL via OROMUCOSAL

## 2021-10-28 MED ORDER — ENOXAPARIN SODIUM 40 MG/0.4ML IJ SOSY
40.0000 mg | PREFILLED_SYRINGE | INTRAMUSCULAR | Status: DC
  Administered 2021-10-29 – 2021-11-06 (×9): 40 mg via SUBCUTANEOUS
  Filled 2021-10-28 (×9): qty 0.4

## 2021-10-28 MED ORDER — TRAZODONE HCL 50 MG PO TABS: 25.0000 mg | ORAL_TABLET | Freq: Every evening | ORAL | Status: DC | PRN

## 2021-10-28 MED ORDER — PROCHLORPERAZINE 25 MG RE SUPP
12.5000 mg | Freq: Four times a day (QID) | RECTAL | Status: DC | PRN
  Filled 2021-10-28: qty 1

## 2021-10-28 MED ORDER — AMLODIPINE BESYLATE 10 MG PO TABS
10.0000 mg | ORAL_TABLET | Freq: Every day | ORAL | Status: DC
  Administered 2021-10-29 – 2021-11-06 (×9): 10 mg via ORAL
  Filled 2021-10-28 (×9): qty 1

## 2021-10-28 MED ORDER — CHLORHEXIDINE GLUCONATE CLOTH 2 % EX PADS: 6.0000 | MEDICATED_PAD | Freq: Every day | CUTANEOUS | Status: DC

## 2021-10-28 MED ORDER — ACETAMINOPHEN 325 MG PO TABS
325.0000 mg | ORAL_TABLET | ORAL | Status: DC | PRN
  Administered 2021-11-01: 650 mg via ORAL
  Administered 2021-11-02: 325 mg via ORAL
  Administered 2021-11-03 – 2021-11-04 (×4): 650 mg via ORAL
  Filled 2021-10-28 (×5): qty 2

## 2021-10-28 MED ORDER — BACITRACIN ZINC 500 UNIT/GM EX OINT
1.0000 | TOPICAL_OINTMENT | Freq: Three times a day (TID) | CUTANEOUS | Status: DC
  Administered 2021-10-28 – 2021-11-01 (×11): 1 via TOPICAL
  Filled 2021-10-28: qty 28.4

## 2021-10-28 MED ORDER — BISACODYL 10 MG RE SUPP: 10.0000 mg | Freq: Every day | RECTAL | Status: DC | PRN

## 2021-10-28 MED ORDER — ORAL CARE MOUTH RINSE: 15.0000 mL | OROMUCOSAL | Status: DC | PRN

## 2021-10-28 MED ORDER — CARVEDILOL 6.25 MG PO TABS
6.2500 mg | ORAL_TABLET | Freq: Two times a day (BID) | ORAL | Status: DC
  Administered 2021-10-28 – 2021-11-06 (×18): 6.25 mg via ORAL
  Filled 2021-10-28 (×18): qty 1

## 2021-10-28 MED ORDER — PROCHLORPERAZINE MALEATE 5 MG PO TABS: 5.0000 mg | ORAL_TABLET | Freq: Four times a day (QID) | ORAL | Status: DC | PRN

## 2021-10-28 MED ORDER — DIPHENHYDRAMINE HCL 12.5 MG/5ML PO ELIX: 12.5000 mg | ORAL_SOLUTION | Freq: Four times a day (QID) | ORAL | Status: DC | PRN

## 2021-10-28 MED ORDER — PANTOPRAZOLE 2 MG/ML SUSPENSION
40.0000 mg | Freq: Two times a day (BID) | ORAL | Status: DC
  Administered 2021-10-28 – 2021-11-06 (×18): 40 mg via ORAL
  Filled 2021-10-28 (×18): qty 20

## 2021-10-28 MED ORDER — HYDROCODONE-ACETAMINOPHEN 7.5-325 MG PO TABS: 0.5000 | ORAL_TABLET | ORAL | Status: DC | PRN

## 2021-10-28 MED ORDER — PROCHLORPERAZINE EDISYLATE 10 MG/2ML IJ SOLN: 5.0000 mg | Freq: Four times a day (QID) | INTRAMUSCULAR | Status: DC | PRN

## 2021-10-28 MED ORDER — ENSURE ENLIVE PO LIQD
237.0000 mL | Freq: Three times a day (TID) | ORAL | Status: DC
  Administered 2021-10-28 – 2021-11-05 (×19): 237 mL via ORAL

## 2021-10-28 MED ORDER — ONDANSETRON HCL 4 MG/2ML IJ SOLN: 4.0000 mg | Freq: Four times a day (QID) | INTRAMUSCULAR | Status: DC | PRN

## 2021-10-28 MED ORDER — FLEET ENEMA 7-19 GM/118ML RE ENEM: 1.0000 | ENEMA | Freq: Once | RECTAL | Status: DC | PRN

## 2021-10-28 MED ORDER — BOOST PLUS PO LIQD
237.0000 mL | Freq: Three times a day (TID) | ORAL | Status: DC
  Administered 2021-10-28 – 2021-11-05 (×17): 237 mL via ORAL
  Filled 2021-10-28 (×27): qty 237

## 2021-10-28 MED ORDER — HYDRALAZINE HCL 50 MG PO TABS
100.0000 mg | ORAL_TABLET | Freq: Three times a day (TID) | ORAL | Status: DC
  Administered 2021-10-28 – 2021-11-06 (×25): 100 mg via ORAL
  Filled 2021-10-28 (×26): qty 2

## 2021-10-28 MED ORDER — ALUM & MAG HYDROXIDE-SIMETH 200-200-20 MG/5ML PO SUSP: 30.0000 mL | ORAL | Status: DC | PRN

## 2021-10-28 NOTE — Progress Notes (Signed)
Trach team notified re: trach on this patient number called [phone]

## 2021-10-28 NOTE — H&P (Signed)
Physical Medicine and Rehabilitation Admission H&P     CC: Functional deficits.      HPI: Nathaniel Hicks. 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 Redmond Baseman. Post op NPO with NGT for decompression due to nausea/vomiting but noted to have coffee ground drainage in NGT and developed fevers due to SIRS on 10/05. He was stated on IV antibiotics due to concerns aspiration PNA and/or wound infection.  NGT was pulled out and patient refused replacement but N/V resolved and cortak placed of nutritional support. Scopolamine patch added to  manage copious secretion from trach and he was started on PMSV trials.    BSS showed mild oral dysphagia and has been advanced to  D1, thins. He is tolerating PMSV during daytime hours and cortak removed on 10/12 with calorie count ongoing. Marland Kitchen PT/OT has been working with patient who continues to be limited by weakness and tachycardia with activity due to deconditioning. CIR recommended due to functional decline.      Review of Systems  Constitutional:  Negative for chills and fever.  HENT:  Negative for hearing loss and tinnitus.   Eyes:  Positive for blurred vision.  Respiratory:  Negative for cough and shortness of breath.   Cardiovascular:  Negative for chest pain.  Gastrointestinal:  Negative for constipation, heartburn and nausea.  Musculoskeletal:  Positive for joint pain (right ip--comes and goes.) and myalgias.  Skin:  Negative for rash.  Neurological:  Positive for weakness. Negative for dizziness and headaches.  Psychiatric/Behavioral:  The patient does not have insomnia.              Past Surgical History:  Procedure Laterality Date   COLONOSCOPY N/A 11/24/2012    Procedure: COLONOSCOPY;   Surgeon: Rogene Houston, MD;  Location: [facility];  Service: Endoscopy;  Laterality: N/A;  830-moved to [place] notified pt   FLOOR OF MOUTH BIOPSY N/A 10/14/2021    Procedure: FLOOR OF MOUTH RESECTION;  Surgeon: Melida Quitter, MD;  Location: [place];  Service: ENT;  Laterality: N/A;   HEMORROIDECTOMY       KNEE ARTHROSCOPY WITH LATERAL MENISECTOMY Right 08/31/2017    Procedure: KNEE ARTHROSCOPY WITH LATERAL MENISECTOMY;  Surgeon: Carole Civil, MD;  Location: [facility];  Service: Orthopedics;  Laterality: Right;   LESION EXCISION N/A 03/23/2012    Procedure: EXCISION NEOPLASM SCALP ;  Surgeon: Jamesetta So, MD;  Location: [facility];  Service: General;  Laterality: N/A;  Excision of Scalp Neoplasm   ORCHIECTOMY Right 12/20/2012    Procedure: RIGHT RADICAL ORCHIECTOMY/POSSIBLE BX RIGHT TESTICLE;  Surgeon: Marissa Nestle, MD;  Location: [facility];  Service: Urology;  Laterality: Right;   PROSTATE SURGERY       RADICAL NECK DISSECTION Bilateral 10/14/2021    Procedure: NECK DISSECTION;  Surgeon: Melida Quitter, MD;  Location: [place];  Service: ENT;  Laterality: Bilateral;   SCALP LACERATION REPAIR        [facility]-Dr Tamala Julian   SKIN FULL THICKNESS GRAFT Bilateral 10/14/2021    Procedure: PLATYSMA FLAP CLOSURE;  Surgeon: Melida Quitter, MD;  Location: [place];  Service: ENT;  Laterality: Bilateral;   TOOTH EXTRACTION   10/14/2021    Procedure: DENTAL EXTRACTIONS;  Surgeon: Melida Quitter, MD;  Location: [place];  Service: ENT;;   TRACHEOSTOMY TUBE PLACEMENT N/A 10/14/2021    Procedure: TRACHEOSTOMY;  Surgeon: Melida Quitter, MD;  Location: [facility];  Service: ENT;  Laterality: N/A;           Family History  Problem Relation Age of Onset   Cancer Brother     Cancer Brother        Social History: Lives alone. 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 He  reports that he has quit smoking--6 months ago. He was smoking about a pack a week.  He has a 12.50 pack-year smoking history. He has never used  smokeless tobacco. He reports that he does not currently use alcohol. He reports that he does not use drugs.     Allergies: No Known Allergies           Medications Prior to Admission  Medication Sig Dispense Refill   amLODipine-benazepril (LOTREL) 10-40 MG capsule Take 1 capsule by mouth daily.       Cholecalciferol (VITAMIN D3) 50 MCG (2000 UT) TABS Take 2,000 Units by mouth daily.       diclofenac Sodium (VOLTAREN) 1 % GEL Apply 1 g topically daily as needed (Arthritis pain).       gabapentin (NEURONTIN) 300 MG capsule Take 300 mg by mouth 3 (three) times daily.       hydrALAZINE (APRESOLINE) 25 MG tablet Take 25 mg by mouth 2 (two) times daily.       ketotifen (ZADITOR) 0.025 % ophthalmic solution Place 1 drop into both eyes daily as needed (red or itching eyes).       magnesium oxide (MAG-OX) 400 MG tablet Take 400 mg by mouth daily.       Menthol, Topical Analgesic, (BIOFREEZE EX) Apply 1 application  topically daily as needed (pain).       oxyCODONE (OXY IR/ROXICODONE) 5 MG immediate release tablet Take 5 mg by mouth 3 (three) times daily as needed for pain.   0   pravastatin (PRAVACHOL) 80 MG tablet Take 80 mg by mouth daily.       RAPAFLO 8 MG CAPS capsule Take 8 mg by mouth daily. Silodosin       vitamin B-12 (CYANOCOBALAMIN) 500 MCG tablet Take 500 mcg by mouth daily.       pantoprazole (PROTONIX) 40 MG tablet Take 1 tablet (40 mg total) by mouth daily. (Patient not taking: Reported on 10/08/2021) 30 tablet 1          Home: [place] expects to be discharged to:: Private residence Living Arrangements: Alone Available Help at Discharge: Family Type of Home: Apartment (second level) Home Access: Elevator, Level entry Home Layout: One level Bathroom Shower/Tub: Chiropodist: Standard Home Equipment: Conservation officer, nature (2 wheels), [employer] - single point Additional Comments: Sister and brother can come every day per patient  Lives With: Alone,  Other (Comment)   Functional History: Prior Function Prior Level of Function : Independent/Modified Independent Mobility Comments: sing cane/RW as needed; but not all the time ADLs Comments: ADLs and IADLs. Sister driving him to the store   Functional Status:  Mobility: Bed Mobility Overal bed mobility: Needs Assistance Bed Mobility: Supine to Sit, Sit to Supine Supine to sit: Supervision, HOB elevated Sit to supine: Min guard General bed mobility comments: increased time and effort, and use of bed rail to pull to EoB, increased effort to get LE back into bed Transfers  Overall transfer level: Needs assistance Equipment used: Rolling walker (2 wheels), None Transfers: Sit to/from Stand Sit to Stand: Min assist Bed to/from chair/wheelchair/BSC transfer type:: Stand pivot Stand pivot transfers: Min assist General transfer comment: min A for power up to RW, minA and increased use of LE posteriorly on side of bed to find balance Ambulation/Gait Ambulation/Gait assistance: Min assist, Mod assist Gait Distance (Feet): 50 Feet Assistive device: Rolling walker (2 wheels) Gait Pattern/deviations: Step-through pattern, Decreased stride length, Shuffle General Gait Details: min A for RW use, increased shuffling, prt requiring minA for ambulation without AD, however experienced 3x loss of balance requiring mod-maxA for steadying Gait velocity: decreased Gait velocity interpretation: <1.31 ft/sec, indicative of household ambulator Pre-gait activities: marching in place with and without UE support   ADL: ADL Overall ADL's : Needs assistance/impaired Eating/Feeding: Set up, Sitting Grooming: Wash/dry hands, Sitting, Supervision/safety, Set up Grooming Details (indicate cue type and reason): sitting in recliner Upper Body Bathing: Supervision/ safety, Set up, Sitting Upper Body Bathing Details (indicate cue type and reason): on EOB Lower Body Bathing: Min guard, Sitting/lateral leans Lower  Body Bathing Details (indicate cue type and reason): simulated via LB dressing Upper Body Dressing : Supervision/safety, Set up, Sitting Lower Body Dressing: Min guard, Sitting/lateral leans Lower Body Dressing Details (indicate cue type and reason): to adjust socks froom EOB Toilet Transfer: Minimal assistance, Stand-pivot, Rolling walker (2 wheels) Toilet Transfer Details (indicate cue type and reason): simulated via stand pivot from EOB>recliner with RW Functional mobility during ADLs: Minimal assistance, Rolling walker (2 wheels) General ADL Comments: ADL participation impacted by decreased activity tolerance, impaired balance and general malaise today   Cognition: Cognition Overall Cognitive Status: Impaired/Different from baseline Orientation Level: Oriented X4 Cognition Arousal/Alertness: Awake/alert Behavior During Therapy: WFL for tasks assessed/performed Overall Cognitive Status: Impaired/Different from baseline Area of Impairment: Problem solving Problem Solving: Slow processing, Difficulty sequencing General Comments: Requiring increased time for processing.     Blood pressure 125/73, pulse 91, temperature 98.2 F (36.8 C), temperature source Oral, resp. rate 17, height '5\' 11"'$  (1.803 m), weight 75.6 kg, SpO2 99 %. Physical Exam Vitals and nursing note reviewed.  Constitutional:      Appearance: Normal appearance.     Comments: Right facial edema with induration along jawline but non-tender.  +missing multiple upper and lower teeth Neck:     Comments: Trach #6 with PMSV. Air trach collar.  Skin:    General: Skin is warm and dry.     Comments: Multiple healed lesions on bilateral shins, R neck/jaw. Subglossal wounds appear well approximated, c/d/I.  Neurological:     Mental Status: He is alert and oriented to person, place, and time. No apparent deficits.     Comments: Mild to moderately dysarthric speech.  Strength 5/5 bilateral UE and LE Sensation to light touch  intact in all extremities        Lab Results Last 48 Hours        Results for orders placed or performed during the hospital encounter of 10/14/21 (from the past 48 hour(s))  Glucose, capillary     Status: Abnormal    Collection Time: 10/26/21 12:38 PM  Result Value Ref Range    Glucose-Capillary 112 (H) 70 - 99 mg/dL      Comment: Glucose reference range applies only to samples taken after fasting for at least 8 hours.  Glucose, capillary     Status: Abnormal    Collection Time: 10/26/21  3:40 PM  Result Value Ref  Range    Glucose-Capillary 111 (H) 70 - 99 mg/dL      Comment: Glucose reference range applies only to samples taken after fasting for at least 8 hours.  Glucose, capillary     Status: Abnormal    Collection Time: 10/26/21  8:14 PM  Result Value Ref Range    Glucose-Capillary 128 (H) 70 - 99 mg/dL      Comment: Glucose reference range applies only to samples taken after fasting for at least 8 hours.  Glucose, capillary     Status: Abnormal    Collection Time: 10/26/21 11:57 PM  Result Value Ref Range    Glucose-Capillary 105 (H) 70 - 99 mg/dL      Comment: Glucose reference range applies only to samples taken after fasting for at least 8 hours.  Glucose, capillary     Status: None    Collection Time: 10/27/21  4:33 AM  Result Value Ref Range    Glucose-Capillary 96 70 - 99 mg/dL      Comment: Glucose reference range applies only to samples taken after fasting for at least 8 hours.  Glucose, capillary     Status: Abnormal    Collection Time: 10/27/21  8:01 AM  Result Value Ref Range    Glucose-Capillary 100 (H) 70 - 99 mg/dL      Comment: Glucose reference range applies only to samples taken after fasting for at least 8 hours.  Glucose, capillary     Status: Abnormal    Collection Time: 10/27/21 11:53 AM  Result Value Ref Range    Glucose-Capillary 105 (H) 70 - 99 mg/dL      Comment: Glucose reference range applies only to samples taken after fasting for at least 8  hours.  Glucose, capillary     Status: Abnormal    Collection Time: 10/27/21  4:04 PM  Result Value Ref Range    Glucose-Capillary 110 (H) 70 - 99 mg/dL      Comment: Glucose reference range applies only to samples taken after fasting for at least 8 hours.  Glucose, capillary     Status: Abnormal    Collection Time: 10/27/21  8:24 PM  Result Value Ref Range    Glucose-Capillary 111 (H) 70 - 99 mg/dL      Comment: Glucose reference range applies only to samples taken after fasting for at least 8 hours.  Glucose, capillary     Status: Abnormal    Collection Time: 10/28/21 12:11 AM  Result Value Ref Range    Glucose-Capillary 102 (H) 70 - 99 mg/dL      Comment: Glucose reference range applies only to samples taken after fasting for at least 8 hours.  Glucose, capillary     Status: None    Collection Time: 10/28/21  5:45 AM  Result Value Ref Range    Glucose-Capillary 95 70 - 99 mg/dL      Comment: Glucose reference range applies only to samples taken after fasting for at least 8 hours.  Glucose, capillary     Status: Abnormal    Collection Time: 10/28/21  8:43 AM  Result Value Ref Range    Glucose-Capillary 102 (H) 70 - 99 mg/dL      Comment: Glucose reference range applies only to samples taken after fasting for at least 8 hours.      Imaging Results (Last 48 hours)  No results found.         Blood pressure 125/73, pulse 91, temperature 98.2 F (36.8 C),  temperature source Oral, resp. rate 17, height '5\' 11"'$  (1.803 m), weight 75.6 kg, SpO2 99 %.   Medical Problem List and Plan: 1. Functional deficits secondary to debility s/p radical neck dissection for oral CA             -patient may shower             -ELOS/Goals: 10-14 days             - Stable for SLP, OT and PT - Mod I goals              2.  Antithrombotics: -DVT/anticoagulation:  SCD             -antiplatelet therapy: N/A 3. Pain Management:  Hydrocodone prn.  4. Mood/Behavior/Sleep: LCSW to follow for evaluation  and support.              -antipsychotic agents: N/a 5. Neuropsych/cognition: This patient is capable of making decisions on his own behalf. 6. Skin/Wound Care: Routine pressure relief measures.             -- Per ENT, The flap under the tongue is evolving with some necrosis of the skin that may later require debridement.            -- No special wound care instructions  7. Fluids/Electrolytes/Nutrition: Monitor I/O. Continue            -- Dysphagia 1 diet with thins, tolerating   8. HTN: Monitor BP TID. Continue hydralazine and coreg.  9. ABLA: stable 12-11. Admission labs.  10. Leucocytosis: WBC continues to fluctuate but down from 14.3-->11.3             --monitor for fevers or other signs of infection.              --encourage pulmonary hygiene             -- s/p 7 days Unasyn 11. Radical neck dissection with trach: Lives alone in an apartment. --Ordered trach team for education on trach care.  -- Friends to help out after d/c?  -- Tolerating [place] #6 with PMV; will inquire with ENT on any special parameters for de-escalation   Bary Leriche, PA-C 10/28/2021   I have examined the patient independently and edited the note for HPI, ROS, exam, assessment, and plan as appropriate. I am in agreement with the above recommendations.   Gertie Gowda, DO 10/28/2021

## 2021-10-28 NOTE — Progress Notes (Signed)
Subjective: Good night.  Doing well this morning.  Objective: Vital signs in last 24 hours: Temp:  [97.5 F (36.4 C)-98.7 F (37.1 C)] 98.6 F (37 C) (10/17 0545) Pulse Rate:  [72-91] 88 (10/17 0801) Resp:  [15-20] 16 (10/17 0801) BP: (122-146)/(61-81) 141/61 (10/17 0545) SpO2:  [97 %-100 %] 100 % (10/17 0801) FiO2 (%):  [21 %] 21 % (10/17 0801) Weight:  [75.6 kg] 75.6 kg (10/17 0500) Wt Readings from Last 1 Encounters:  10/28/21 75.6 kg    Intake/Output from previous day: 10/16 0701 - 10/17 0700 In: 240 [P.O.:240] Out: 1300 [Urine:1300] Intake/Output this shift: No intake/output data recorded.  General appearance: alert, cooperative, and no distress Throat: flap under tongue intact with some pink in middle, superficial necrosis Neck: incision intact, trach in place  No results for input(s): "WBC", "HGB", "HCT", "PLT" in the last 72 hours.  No results for input(s): "NA", "K", "CL", "CO2", "GLUCOSE", "BUN", "CREATININE", "CALCIUM" in the last 72 hours.  Medications: I have reviewed the patient's current medications.  Assessment/Plan: Floor of mouth cancer  Tolerating oral diet.  Flap intact.  Trach functioning well.  Ready for discharge.  Waiting to hear about inpatient rehab.   LOS: 14 days   Melida Quitter 10/28/2021, 8:08 AM

## 2021-10-28 NOTE — Progress Notes (Signed)
PMR Admission Coordinator Pre-Admission Assessment   Patient: Nathaniel Hicks is an 81 y.o., male MRN: [id number] DOB: 1940-04-21 Height: 5\' 11"  (180.3 cm) Weight: 80.1 kg   Insurance Information HMO:     PPO:      PCP:      IPA:      80/20:      OTHER:  PRIMARY: [employer] Med      Policy#: [id number]      Subscriber: Pt CM Name:       Phone#: [phone]     Fax#: [phone] Pre-Cert#: [id number]      Employer:  Benefits:  Phone #:      Name: I spoke with Dawn at [facility] who reports appeal was overturned and Pt. Is approved to admit 10/16 for 8 days with updated due 11/03/21 Eff Date: 01/12/2021 - still active Deductible: does not have one OOP Max: $8,300 ($1,852.86 met) CIR: $1,556/ admission copay SNF: $0.00 Copayment per day for days 1-20; $200 Copayment per day for days 21-100; Maximum of 100 days/benefit period Outpatient: 80% coverage; 20% co-insurance Home Health:  100% coverage, limited by medical necessity DME: 80% coverage; 20% co-insurance Providers: in network SECONDARY: Medicaid [place] Access      Policy#: [id number]     Phone#:    Financial Counselor:       Phone#:    The "Data Collection Information Summary" for patients in Inpatient Rehabilitation Facilities with attached "Privacy Act [place] Records" was provided and verbally reviewed with: Patient   Emergency Contact Information Contact Information       Name Relation Home Work Mobile    Lindstrom Sister [phone]   [phone]    [place] Sister [phone]   [phone]           Current Medical History  Patient Admitting Diagnosis: Oral CA History of Present Illness:  Nathaniel Hicks is an 81 year old male with history of testicular cancer, BPH, HTN, Vit D deficiency, HOH who 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  Redmond Baseman. Post op NPO with NGT for decompression due to nausea/vomiting but noted to have coffee ground drainage in NGT and developed fevers due to SIRS on 10/05. He was stated on IV antibiotics due to concerns aspiration PNA and/or wound infection.  NGT was pulled out and patient refused replacement but N/V resolved and cortak placed of nutritional support. Scopolamine patch added to  manage copious secretion from trach and he was started on PMSV trials. BSS showed mild oral dysphagia and has been advanced to  D1, thins. He is tolerating PMSV during daytime hours. PT/OT has been working with patient who continues to be limited by weakness and tachycardia with activity due to deconditioning. CIR recommended due to functional decline.    Patient's medical record from [facility]  has been reviewed by the rehabilitation admission coordinator and physician.   Past Medical History      Past Medical History:  Diagnosis Date   Arthritis     BPH (benign prostatic hyperplasia)     Difficult intubation     HOH (hard of hearing)     Hyperlipidemia 11/02/2017   Hypertension     Hypertension 11/02/2017   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[place])      2014   Vitamin D deficiency 11/02/2017      Has the patient  had major surgery during 100 days prior to admission? Yes   Family History   family history includes Cancer in his brother and brother.   Current Medications   Current Facility-Administered Medications:    0.9 %  sodium chloride infusion, , Intravenous, PRN, Melida Quitter, MD, Last Rate: 10 mL/hr at 10/19/21 1106, New Bag at 10/19/21 1106   amLODipine (NORVASC) tablet 10 mg, 10 mg, Per Tube, Daily, Lala Lund K, MD, 10 mg at 10/22/21 1010   Ampicillin-Sulbactam (UNASYN) 3 g in sodium chloride 0.9 % 100 mL IVPB, 3 g, Intravenous, Q6H, Thurnell Lose, MD, Last Rate: 200 mL/hr at 10/22/21 1231, 3 g at 10/22/21 1231   bacitracin ointment 1 Application, 1 Application, Topical, S9G, Melida Quitter, MD, 1 Application at 28/36/62 1231   carvedilol (COREG) tablet 6.25 mg, 6.25 mg, Per Tube, BID WC, Lala Lund K, MD, 6.25 mg at 10/22/21 1010   Chlorhexidine Gluconate Cloth 2 % PADS 6 each, 6 each, Topical, Q0600, Melida Quitter, MD, 6 each at 10/22/21 0523   feeding supplement (BOOST / RESOURCE BREEZE) liquid 1 Container, 1 Container, Oral, TID BM, Melida Quitter, MD, 1 Container at 10/20/21 2001   feeding supplement (OSMOLITE 1.5 CAL) liquid 1,000 mL, 1,000 mL, Per Tube, Continuous, Izora Gala, MD, Last Rate: 55 mL/hr at 10/21/21 1800, Infusion Verify at 10/21/21 1800   feeding supplement (PROSource TF20) liquid 60 mL, 60 mL, Per Tube, BID, Izora Gala, MD, 60 mL at 10/22/21 1010   free water 200 mL, 200 mL, Per Tube, Q6H, Thurnell Lose, MD, 200 mL at 10/22/21 0523   hydrALAZINE (APRESOLINE) injection 10 mg, 10 mg, Intravenous, Q6H PRN, Thurnell Lose, MD, 10 mg at 10/19/21 0237   hydrALAZINE (APRESOLINE) tablet 100 mg, 100 mg, Per Tube, Q8H, Thurnell Lose, MD, 100 mg at 10/22/21 0519   HYDROcodone-acetaminophen (HYCET) 7.5-325 mg/15 ml solution 10-15 mL, 10-15 mL, Per Tube, Q4H PRN, Melida Quitter, MD, 15 mL at 10/20/21 1432   labetalol (NORMODYNE) injection 10 mg, 10 mg, Intravenous, Q2H PRN, Jerrell Belfast, MD, 10 mg at 10/14/21 2259   metoCLOPramide (REGLAN) injection 5 mg, 5 mg, Intravenous, Q6H PRN, Melida Quitter, MD   morphine (PF) 2 MG/ML injection 2-4 mg, 2-4 mg, Intravenous, Q2H PRN, Melida Quitter, MD, 2 mg at 10/17/21 2249   ondansetron (ZOFRAN) injection 4 mg, 4 mg, Intravenous, Q6H PRN, Melida Quitter, MD, 4 mg at 10/21/21 0109   Oral care mouth rinse, 15 mL, Mouth Rinse, 4 times per day, Melida Quitter, MD, 15 mL at 10/22/21 1010   Oral care mouth rinse, 15 mL, Mouth Rinse, PRN, Melida Quitter, MD   pantoprazole (PROTONIX) 2 mg/mL oral suspension 40 mg, 40 mg, Per Tube, BID, Bell, Lorin C, RPH, 40 mg at 10/21/21 2209   polyethylene glycol (MIRALAX / GLYCOLAX)  packet 17 g, 17 g, Per Tube, BID, Thurnell Lose, MD, 17 g at 10/22/21 1009   Patients Current Diet:  Diet Order                  DIET - DYS 1 Room service appropriate? No; Fluid consistency: Thin  Diet effective now                         Precautions / Restrictions Precautions Precautions: Fall Precaution Comments: trach, feeding tube (NGT) Other Brace: PMV during therapy Restrictions Weight Bearing Restrictions: No    Has the patient had 2 or more falls  or a fall with injury in the past year? No   Prior Activity Level Limited Community (1-2x/wk): pt. went out for appointments   Prior Functional Level Self Care: Did the patient need help bathing, dressing, using the toilet or eating? Independent   Indoor Mobility: Did the patient need assistance with walking from room to room (with or without device)? Independent   Stairs: Did the patient need assistance with internal or external stairs (with or without device)? Independent   Functional Cognition: Did the patient need help planning regular tasks such as shopping or remembering to take medications? Independent   Patient Information Are you of Hispanic, Latino/a,or Spanish origin?: A. No, not of Hispanic, Latino/a, or Spanish origin What is your race?: B. Black or African American Do you need or want an interpreter to communicate with a doctor or health care staff?: 0. No   Patient's Response To:  Health Literacy and Transportation Is the patient able to respond to health literacy and transportation needs?: Yes Health Literacy - How often do you need to have someone help you when you read instructions, pamphlets, or other written material from your doctor or pharmacy?: Never In the past 12 months, has lack of transportation kept you from medical appointments or from getting medications?: No In the past 12 months, has lack of transportation kept you from meetings, work, or from getting things needed for daily living?:  No   Development worker, international aid / [place] Devices/Equipment: Eyeglasses, Scales, Dentures (specify type), Cane (specify quad or straight), Wheelchair Home Equipment: Conservation officer, nature (2 wheels), [employer] - single point   Prior Device Use: Indicate devices/aids used by the patient prior to current illness, exacerbation or injury? None of the above   Current Functional Level Cognition   Overall Cognitive Status: Within Functional Limits for tasks assessed Orientation Level: Oriented X4 General Comments: pretty good per sister.    Extremity Assessment (includes Sensation/Coordination)   Upper Extremity Assessment: Defer to OT evaluation  Lower Extremity Assessment: Generalized weakness (Grossly 3/5 per seated MMT)     ADLs   Overall ADL's : Needs assistance/impaired Eating/Feeding: Set up, Sitting Grooming: Wash/dry hands, Wash/dry face, Oral care, Set up, Sitting Grooming Details (indicate cue type and reason): on EOB Upper Body Bathing: Supervision/ safety, Set up, Sitting Upper Body Bathing Details (indicate cue type and reason): on EOB Lower Body Bathing: Min guard, Sit to/from stand Upper Body Dressing : Supervision/safety, Set up, Sitting Lower Body Dressing: Minimal assistance, Sit to/from stand Lower Body Dressing Details (indicate cue type and reason): change gown Toilet Transfer: Minimal assistance, Stand-pivot, Rolling walker (2 wheels) Functional mobility during ADLs: Minimal assistance, Rolling walker (2 wheels) General ADL Comments: performed self care tasks seated on eOB     Mobility   Overal bed mobility: Needs Assistance Bed Mobility: Supine to Sit, Sit to Supine Supine to sit: Supervision Sit to supine: Supervision General bed mobility comments: verbal cues for rail use and lines     Transfers   Overall transfer level: Needs assistance Equipment used: None Transfers: Sit to/from Stand Sit to Stand: Min assist Bed to/from chair/wheelchair/BSC  transfer type:: Stand pivot Stand pivot transfers: Min assist General transfer comment: stood from EOB with min assist to power up and to stedy. Patient stated he felt too weak for transfer     Ambulation / Gait / Stairs / Wheelchair Mobility   Ambulation/Gait Ambulation/Gait assistance: Min assist, Mod assist Gait Distance (Feet): 160 Feet Assistive device: Rolling walker (2 wheels) (chair  to follow for safety) Gait Pattern/deviations: Step-through pattern General Gait Details: cues for posture, looking forward, stopping when it was clear he was fatigued.  Sat on RA 98-100%, but HR rose during gait to max of 142, sustained in the high 130's Gait velocity: decreased Gait velocity interpretation: <1.8 ft/sec, indicate of risk for recurrent falls Pre-gait activities: standing marches with RW min assist in prep for gait     Posture / Balance Balance Overall balance assessment: Needs assistance Sitting-balance support: No upper extremity supported, Feet supported Sitting balance-Leahy Scale: Fair Standing balance support: Single extremity supported, No upper extremity supported, During functional activity Standing balance-Leahy Scale: Poor Standing balance comment: reliant on therapist for support     Special needs/care consideration Skin intact  and Special service needs coretrak    Previous Home Environment (from acute therapy documentation) Living Arrangements: Alone  Lives With: Alone, Other (Comment) Available Help at Discharge: Family Type of Home: Apartment (second level) Home Layout: One level Home Access: Elevator, Level entry Bathroom Shower/Tub: Chiropodist: Standard Home Care Services: No Additional Comments: Sister and brother can come every day per patient   Discharge Living Setting Plans for Discharge Living Setting: Patient's home Type of Home at Discharge: Apartment Discharge Home Layout: One level Discharge Home Access: Level entry,  Elevator Discharge Bathroom Shower/Tub: Tub/shower unit Discharge Bathroom Toilet: Standard Discharge Bathroom Accessibility: Yes How Accessible: Accessible via walker Does the patient have any problems obtaining your medications?: No   Social/Family/Support Systems Patient Roles: Other (Comment) Contact Information: [phone] Anticipated Caregiver: helen [place] Crane Ability/Limitations of Caregiver: Sister can provide 24/7 min a Caregiver Availability: 24/7 Discharge Plan Discussed with Primary Caregiver: Yes Is Caregiver In Agreement with Plan?: No Does Caregiver/Family have Issues with Lodging/Transportation while Pt is in Rehab?: No   Goals Patient/Family Goal for Rehab: PT/OT/SLP Supervision-mod I Expected length of stay: 10-12 days Pt/Family Agrees to Admission and willing to participate: Yes Program Orientation Provided & Reviewed with Pt/Caregiver Including Roles  & Responsibilities: Yes   Decrease burden of Care through IP rehab admission: n/a   Possible need for SNF placement upon discharge: not anticipated    Patient Condition: I have reviewed medical records from [facility], spoken with CSW, and patient. I met with patient at the bedside for inpatient rehabilitation assessment.  Patient will benefit from ongoing PT, OT, and SLP, can actively participate in 3 hours of therapy a day 5 days of the week, and can make measurable gains during the admission.  Patient will also benefit from the coordinated team approach during an Inpatient Acute Rehabilitation admission.  The patient will receive intensive therapy as well as Rehabilitation physician, nursing, social worker, and care management interventions.  Due to safety, skin/wound care, disease management, medication administration, pain management, and patient education the patient requires 24 hour a day rehabilitation nursing.  The patient is currently min A with mobility and basic ADLs.  Discharge setting and therapy post  discharge at home with home health is anticipated.  Patient has agreed to participate in the Acute Inpatient Rehabilitation Program and will admit today.   Preadmission Screen Completed By:  Genella Mech, 10/22/2021 2:13 PM _________________________________________________________ Discussed status with Dr. Tressa Busman  on 10/28/21 at 47 and received approval for admission today.   Admission Coordinator:  Genella Mech, CCC-SLP, time 673 /Date 10/28/21   Assessment/Plan: Diagnosis: Does the need for close, 24 hr/day Medical supervision in concert with the patient's rehab needs make it unreasonable for this patient  to be served in a less intensive setting? Yes Co-Morbidities requiring supervision/potential complications: Dysphagia, wound management, tracheostomy, urinary incontinence, pain control Due to bladder management, skin/wound care, disease management, medication administration, pain management, and patient education, does the patient require 24 hr/day rehab nursing? Yes Does the patient require coordinated care of a physician, rehab nurse, PT, OT, and SLP to address physical and functional deficits in the context of the above medical diagnosis(es)? Yes Addressing deficits in the following areas: balance, endurance, locomotion, strength, transferring, bowel/bladder control, bathing, dressing, feeding, grooming, toileting, speech, swallowing, and psychosocial support Can the patient actively participate in an intensive therapy program of at least 3 hrs of therapy 5 days a week? Yes The potential for patient to make measurable gains while on inpatient rehab is excellent Anticipated functional outcomes upon discharge from inpatient rehab: modified independent and supervision PT, modified independent OT, modified independent SLP Estimated rehab length of stay to reach the above functional goals is: 10-14 days Anticipated discharge destination: Home 10. Overall Rehab/Functional Prognosis:  excellent     MD Signature:   Gertie Gowda, DO 10/28/2021

## 2021-10-28 NOTE — Progress Notes (Signed)
Inpatient Rehabilitation Admission Medication Review by a Pharmacist  A complete drug regimen review was completed for this patient to identify any potential clinically significant medication issues.  High Risk Drug Classes Is patient taking? Indication by Medication  Antipsychotic Yes Compazine PO/IM - N/V  Anticoagulant Yes Lovenox - DVT px  Antibiotic No   Opioid Yes Vicodin - pain  Antiplatelet No   Hypoglycemics/insulin No   Vasoactive Medication Yes Amlodipine/coreg/hydralazine - HTN  Chemotherapy No   Other Yes Protonix - GERD Trazodone - sleep     Type of Medication Issue Identified Description of Issue Recommendation(s)  Drug Interaction(s) (clinically significant)     Duplicate Therapy     Allergy     No Medication Administration End Date     Incorrect Dose     Additional Drug Therapy Needed  Pravachol, vitamin D, B12, voltaren, magox, gabapentin, benazepril, ketoprofen eye drops, rapaflo Message Pam Love in AM  Significant med changes from prior encounter (inform family/care partners about these prior to discharge).    Other       Clinically significant medication issues were identified that warrant physician communication and completion of prescribed/recommended actions by midnight of the next day:  Yes  Name of provider notified for urgent issues identified: Algis Liming, PA  Provider Method of Notification: Secure chat    Pharmacist comments:   Time spent performing this drug regimen review (minutes):  [place], PharmD, [place], AAHIVP, CPP Infectious Disease Pharmacist 10/28/2021 8:02 PM

## 2021-10-28 NOTE — Plan of Care (Signed)
  Problem: RH BLADDER ELIMINATION Goal: RH STG MANAGE BLADDER WITH ASSISTANCE Description: STG Manage Bladder With mod I  Assistance Outcome: Not Progressing; incontinence   

## 2021-10-29 ENCOUNTER — Other Ambulatory Visit: Payer: Self-pay

## 2021-10-29 ENCOUNTER — Encounter (HOSPITAL_COMMUNITY): Payer: Self-pay | Admitting: Physical Medicine & Rehabilitation

## 2021-10-29 DIAGNOSIS — D62 Acute posthemorrhagic anemia: Secondary | ICD-10-CM

## 2021-10-29 DIAGNOSIS — I10 Essential (primary) hypertension: Secondary | ICD-10-CM

## 2021-10-29 DIAGNOSIS — E8809 Other disorders of plasma-protein metabolism, not elsewhere classified: Secondary | ICD-10-CM

## 2021-10-29 LAB — COMPREHENSIVE METABOLIC PANEL
ALT: 42 U/L (ref 0–44)
AST: 21 U/L (ref 15–41)
Albumin: 2.8 g/dL — ABNORMAL LOW (ref 3.5–5.0)
Alkaline Phosphatase: 52 U/L (ref 38–126)
Anion gap: 9 (ref 5–15)
BUN: 18 mg/dL (ref 8–23)
CO2: 22 mmol/L (ref 22–32)
Calcium: 10 mg/dL (ref 8.9–10.3)
Chloride: 104 mmol/L (ref 98–111)
Creatinine, Ser: 0.72 mg/dL (ref 0.61–1.24)
GFR, Estimated: >60 mL/min (ref >60)
Glucose, Bld: 96 mg/dL (ref 70–99)
Potassium: 4.1 mmol/L (ref 3.5–5.1)
Sodium: 135 mmol/L (ref 135–145)
Total Bilirubin: 0.4 mg/dL (ref 0.3–1.2)
Total Protein: 6.3 g/dL — ABNORMAL LOW (ref 6.5–8.1)

## 2021-10-29 LAB — CBC WITH DIFFERENTIAL/PLATELET
Abs Immature Granulocytes: 0.03 10*3/uL (ref 0.00–0.07)
Basophils Absolute: 0.1 10*3/uL (ref 0.0–0.1)
Basophils Relative: 1 %
Eosinophils Absolute: 0.2 10*3/uL (ref 0.0–0.5)
Eosinophils Relative: 2 %
HCT: 32.7 % — ABNORMAL LOW (ref 39.0–52.0)
Hemoglobin: 10.6 g/dL — ABNORMAL LOW (ref 13.0–17.0)
Immature Granulocytes: 0 %
Lymphocytes Relative: 15 %
Lymphs Abs: 1.3 10*3/uL (ref 0.7–4.0)
MCH: 32.7 pg (ref 26.0–34.0)
MCHC: 32.4 g/dL (ref 30.0–36.0)
MCV: 100.9 fL — ABNORMAL HIGH (ref 80.0–100.0)
Monocytes Absolute: 1.1 10*3/uL — ABNORMAL HIGH (ref 0.1–1.0)
Monocytes Relative: 14 %
Neutro Abs: 5.5 10*3/uL (ref 1.7–7.7)
Neutrophils Relative %: 68 %
Platelets: 352 10*3/uL (ref 150–400)
RBC: 3.24 MIL/uL — ABNORMAL LOW (ref 4.22–5.81)
RDW: 13.8 % (ref 11.5–15.5)
WBC: 8.2 10*3/uL (ref 4.0–10.5)
nRBC: 0 % (ref 0.0–0.2)

## 2021-10-29 MED ORDER — ENSURE MAX PROTEIN PO LIQD
11.0000 [oz_av] | Freq: Every day | ORAL | Status: DC
  Administered 2021-10-29 – 2021-11-05 (×7): 11 [oz_av] via ORAL

## 2021-10-29 NOTE — Evaluation (Signed)
Physical Therapy Assessment and Plan  Patient Details  Name: Nathaniel Hicks MRN: [id number] Date of Birth: November 08, 1940  PT Diagnosis: Abnormal posture, Abnormality of gait, Cognitive deficits, Difficulty walking, Muscle weakness, Osteoarthritis, and Pain in joint Rehab Potential: Good ELOS: 7 days   Today's Date: 10/29/2021 PT Individual Time: 1300-1415 PT Individual Time Calculation (min): 75 min    Hospital Problem: Principal Problem:   Debility   Past Medical History:  Past Medical History:  Diagnosis Date   Arthritis    BPH (benign prostatic hyperplasia)    Difficult intubation    HOH (hard of hearing)    Hyperlipidemia 11/02/2017   Hypertension    Hypertension 11/02/2017   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[place])    2014   Vitamin D deficiency 11/02/2017   Past Surgical History:  Past Surgical History:  Procedure Laterality Date   COLONOSCOPY N/A 11/24/2012   Procedure: COLONOSCOPY;  Surgeon: Rogene Houston, MD;  [facility];  Service: Endoscopy;  Laterality: N/A;  830-moved to [place] notified pt   FLOOR OF MOUTH BIOPSY N/A 10/14/2021   Procedure: FLOOR OF MOUTH RESECTION;  Surgeon: Melida Quitter, MD;  Location: [facility];  Service: ENT;  Laterality: N/A;   HEMORROIDECTOMY     KNEE ARTHROSCOPY WITH LATERAL MENISECTOMY Right 08/31/2017   Procedure: KNEE ARTHROSCOPY WITH LATERAL MENISECTOMY;  Surgeon: Carole Civil, MD;  Location: [facility];  Service: Orthopedics;  Laterality: Right;   LESION EXCISION N/A 03/23/2012   Procedure: EXCISION NEOPLASM SCALP ;  Surgeon: Jamesetta So, MD;  Location: [facility];  Service: General;  Laterality: N/A;  Excision of Scalp Neoplasm   ORCHIECTOMY Right 12/20/2012   Procedure: RIGHT RADICAL ORCHIECTOMY/POSSIBLE BX RIGHT TESTICLE;  Surgeon: Marissa Nestle, MD;  Location: [facility];  Service: Urology;  Laterality: Right;   PROSTATE SURGERY     RADICAL NECK DISSECTION Bilateral 10/14/2021   Procedure: NECK DISSECTION;  Surgeon: Melida Quitter, MD;   Location: [place];  Service: ENT;  Laterality: Bilateral;   SCALP LACERATION REPAIR     [facility]-Dr Tamala Julian   SKIN FULL THICKNESS GRAFT Bilateral 10/14/2021   Procedure: PLATYSMA FLAP CLOSURE;  Surgeon: Melida Quitter, MD;  Location: [place];  Service: ENT;  Laterality: Bilateral;   TOOTH EXTRACTION  10/14/2021   Procedure: DENTAL EXTRACTIONS;  Surgeon: Melida Quitter, MD;  Location: [place];  Service: ENT;;   TRACHEOSTOMY TUBE PLACEMENT N/A 10/14/2021   Procedure: TRACHEOSTOMY;  Surgeon: Melida Quitter, MD;  Location[facility];  Service: ENT;  Laterality: N/A;    Assessment & Plan Clinical Impression: Patient is a 81 y.o. 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 Redmond Baseman. Post op NPO with NGT for decompression due to nausea/vomiting but noted to have coffee ground drainage in NGT and developed fevers due to SIRS on 10/05. He was stated on IV antibiotics due to concerns aspiration PNA and/or wound infection.  NGT was pulled out and patient refused replacement but N/V resolved and cortak placed of nutritional support. Scopolamine patch added to  manage copious secretion from trach and he was started on PMSV trials. Patient transferred to CIR on 10/28/2021 .   Patient currently requires min with mobility secondary to muscle weakness and muscle joint tightness, decreased cardiorespiratoy endurance and decreased  oxygen support, decreased memory, and decreased standing balance, decreased postural control, and decreased balance strategies.  Prior to hospitalization, patient was modified independent  with mobility and lived with Alone in a Apartment (second level) home.  Home access is ~12 STE with B rails from back, there is an elevator at the front of the apartment buildingElevator,  Stairs to enter.  Patient will benefit from skilled PT intervention to maximize safe functional mobility, minimize fall risk, and decrease caregiver burden for planned discharge home with intermittent assist.  Anticipate patient will benefit from follow up OP at discharge.  PT - End of Session Activity Tolerance: Tolerates 30+ min activity with multiple rests Endurance Deficit: Yes PT Assessment Rehab Potential (ACUTE/IP ONLY): Good PT Barriers to Discharge: Behavior;New oxygen;Lack of/limited family support PT Barriers to Discharge Comments: patient wants to be mod I at d/c, demos mild memory recall deficits with novel information, mild imparements in safety awareness with AD, Required 21% FiO2 via trach collar PT Patient demonstrates impairments in the following area(s): Balance;Behavior;Pain;Edema;Safety;Endurance;Motor;Skin Integrity;Nutrition PT Transfers Functional Problem(s): Bed Mobility;Bed to Chair;Car;Furniture PT Locomotion Functional Problem(s): Ambulation;Wheelchair Mobility;Stairs PT Plan PT Intensity: Minimum of 1-2 x/day ,45 to 90 minutes PT Frequency: 5 out of 7 days PT Duration Estimated Length of Stay: 7 days PT Treatment/Interventions: Ambulation/gait training;Cognitive remediation/compensation;Discharge planning;DME/adaptive equipment instruction;Functional mobility training;Pain management;Psychosocial support;Splinting/orthotics;Therapeutic Activities;UE/LE Strength taining/ROM;Wheelchair propulsion/positioning;UE/LE Coordination activities;Therapeutic Exercise;Stair training;Skin care/wound management;Patient/family education;Neuromuscular re-education;Functional electrical stimulation;Disease management/prevention;Community reintegration;Balance/vestibular training PT Transfers Anticipated Outcome(s): mod I using LRAD PT Locomotion Anticipated Outcome(s): mod I household, supervision community using LRAD PT Recommendation Recommendations for Other Services: Therapeutic  Recreation consult Therapeutic Recreation Interventions: Stress management Follow Up Recommendations: Outpatient PT Patient destination: Home Equipment Recommended: Rolling walker with 5" wheels Equipment Details: has RW and SPC   PT Evaluation Precautions/Restrictions Precautions Precautions: Fall Precaution Comments: trach Other Brace: PMV all waking hours Restrictions Weight Bearing Restrictions: No Pain Pain Assessment Pain Score: 0-No pain Pain Interference Pain Interference Pain Effect on Sleep: 1. Rarely or not at all Pain Interference with Therapy Activities: 1. Rarely or not at all Pain Interference with Day-to-Day Activities: 1. Rarely or not at all Home Living/Prior [place] Available Help at Discharge: Family;Available PRN/intermittently Type of Home: Apartment (second level) Home Access: Elevator;Stairs to enter Entrance Stairs-Number of Steps: ~12 STE with B rails from back, there is an elevator at the front of the apartment building Home Layout: One level Bathroom Shower/Tub: Chiropodist: Standard Additional Comments: Sister and brother can come every day and he has a friend that lives in the same building, per patient  Lives With: Alone Prior Function Level of Independence: Independent with basic ADLs;Independent with homemaking with ambulation;Independent with gait;Independent with transfers;Requires assistive device for independence  Able to Take Stairs?: Yes Driving: No Vocation: Retired Vision/Perception  Vision - History Ability to See in Adequate Light: 0 Adequate Perception Perception: Within Functional Limits Praxis Praxis: Intact  Cognition Overall Cognitive Status: Impaired/Different from baseline Arousal/Alertness: Awake/alert Orientation Level: Oriented X4 Memory: Impaired Memory Impairment: Decreased short term memory Decreased Short Term Memory: Functional basic Awareness: Appears intact Problem  Solving: Appears intact Safety/Judgment: Appears intact Sensation Sensation Light Touch: Appears Intact Hot/Cold: Appears Intact Proprioception: Appears Intact Stereognosis: Not tested Coordination Gross Motor Movements are Fluid and Coordinated: No Fine Motor Movements are Fluid and Coordinated: Yes Coordination and Movement Description: 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, slower movement patterns but functional for self care tasks Finger Nose Finger Test: [facility] Heel Shin Test: limited by hip flexor  weakness Motor  Motor Motor: Abnormal postural alignment and control Motor - Skilled Clinical Observations: generalized weakness and decreased postural control   Trunk/Postural Assessment  Cervical Assessment Cervical Assessment: Within Functional Limits Thoracic Assessment Thoracic Assessment: Within Functional Limits Lumbar Assessment Lumbar Assessment: Within Functional Limits Postural Control Postural Control: Within Functional Limits  Balance Standardized Balance Assessment Standardized Balance Assessment: Berg Balance Test Berg Balance Test Sit to Stand: Needs minimal aid to stand or to stabilize Standing Unsupported: Able to stand 30 seconds unsupported Sitting with Back Unsupported but Feet Supported on Floor or Stool: Able to sit safely and securely 2 minutes Stand to Sit: Controls descent by using hands Transfers: Needs one person to assist Standing Unsupported with Eyes Closed: Needs help to keep from falling Standing Ubsupported with Feet Together: Needs help to attain position and unable to hold for 15 seconds From Standing, Reach Forward with Outstretched Arm: Loses balance while trying/requires external support From Standing Position, Pick up Object from Floor: Unable to try/needs assist to keep balance From Standing Position, Turn to Look Behind Over each Shoulder: Needs assist to keep from losing balance and falling Turn 360 Degrees: Needs assistance while turning Standing  Unsupported, Alternately Place Feet on Step/Stool: Needs assistance to keep from falling or unable to try Standing Unsupported, One Foot in Front: Loses balance while stepping or standing Standing on One Leg: Unable to try or needs assist to prevent fall Total Score: 11 Static Standing Balance Static Standing - Level of Assistance: 5: Stand by assistance Dynamic Standing Balance Dynamic Standing - Level of Assistance: 4: Min assist Extremity Assessment  RUE Assessment RUE Assessment: Within Functional Limits Active Range of Motion (AROM) Comments: WFL LUE Assessment LUE Assessment: Within Functional Limits RLE Assessment RLE Assessment: Exceptions to [facility] Active Range of Motion (AROM) Comments: tight hamstrings RLE Strength Right Hip Flexion: 4/5 Right Hip ABduction: 4+/5 Right Hip ADduction: 5/5 Right Knee Flexion: 5/5 Right Knee Extension: 4+/5 Right Ankle Dorsiflexion: 5/5 Right Ankle Plantar Flexion: 5/5 LLE Assessment LLE Assessment: Exceptions to [facility] Active Range of Motion (AROM) Comments: tight hamstrings LLE Strength Left Hip ABduction: 4/5 Left Hip ADduction: 4+/5 Left Knee Flexion: 5/5 Left Knee Extension: 4+/5 Left Ankle Dorsiflexion: 5/5 Left Ankle Plantar Flexion: 5/5  Care Tool Care Tool Bed Mobility Roll left and right activity   Roll left and right assist level: Independent with assistive device Roll left and right assistive device comment: HOB elevate  Sit to lying activity   Sit to lying assist level: Independent with assistive device Sit to lying assistive device comment: HOB elevated  Lying to sitting on side of bed activity   Lying to sitting on side of bed assist level: the ability to move from lying on the back to sitting on the side of the bed with no back support.: Independent with assistive device Lying to sitting on side of bed assist device comment: the ability to move from lying on the back to sitting on the side of the bed with no back  support.: HOB elevated   Care Tool Transfers Sit to stand transfer   Sit to stand assist level: Minimal Assistance - Patient > 75%    Chair/bed transfer   Chair/bed transfer assist level: Minimal Assistance - Patient > 75%     Toilet transfer   Assist Level: Minimal Assistance - Patient > 75%    Car transfer   Car transfer assist level: Minimal Assistance - Patient > 75%      Care Tool  Locomotion Ambulation   Assist level: Minimal Assistance - Patient > 75% Assistive device: No Device Max distance: 160 ft  Walk 10 feet activity   Assist level: Minimal Assistance - Patient > 75% Assistive device: No Device   Walk 50 feet with 2 turns activity   Assist level: Minimal Assistance - Patient > 75% Assistive device: No Device  Walk 150 feet activity   Assist level: Minimal Assistance - Patient > 75% Assistive device: No Device  Walk 10 feet on uneven surfaces activity   Assist level: Minimal Assistance - Patient > 75%    Stairs   Assist level: Contact Guard/Touching assist Stairs assistive device: 2 hand rails Max number of stairs: 12  Walk up/down 1 step activity   Walk up/down 1 step (curb) assist level: Minimal Assistance - Patient > 75% Walk up/down 1 step or curb assistive device: No device  Walk up/down 4 steps activity   Walk up/down 4 steps assist level: Contact Guard/Touching assist Walk up/down 4 steps assistive device: 2 hand rails  Walk up/down 12 steps activity   Walk up/down 12 steps assist level: Contact Guard/Touching assist Walk up/down 12 steps assistive device: 2 hand rails  Pick up small objects from floor   Pick up small object from the floor assist level: Moderate Assistance - Patient 50 - 74%    Wheelchair Is the patient using a wheelchair?: Yes Type of Wheelchair: Manual   Wheelchair assist level: Supervision/Verbal cueing Max wheelchair distance: >150 ft  Wheel 50 feet with 2 turns activity   Assist Level: Supervision/Verbal cueing  Wheel 150  feet activity   Assist Level: Supervision/Verbal cueing    Refer to Care Plan for Long Term Goals  SHORT TERM GOAL WEEK 1 PT Short Term Goal 1 (Week 1): STG=LTG due to ELOS.  Recommendations for other services: None   Skilled Therapeutic Intervention Evaluation completed (see details above and below) with education on PT POC and goals and individual treatment initiated with focus on functional mobility/transfers, LE strength, dynamic standing balance/coordination, ambulation, stair navigation, simulated car transfers, and improved endurance with activity.  Patient standing using the urinal with NT upon PT arrival. Patient alert and agreeable to PT session. Patient denied pain during session, reports intermittent R knee pain PTA, none since admission.   Patient on RA with PMSV donned throughout session. SPO2 94%-100%, HR 80's-120's with activity. Quickly recovered, <1 min, to baseline with seated rest breaks and cues for pursed lip breathing.   Therapeutic Activity: Bed Mobility: Patient performed rolling R/L and supine to/from sit with mod I with HOB elevated per orders.  Transfers: Patient performed sit to/from stand with min A without an AD x2 and CGA-supervision with RW x4. Provided verbal cues for forward weight shift, safe use of AD, and reaching back to sit. Patient performed a simulated sedan height car transfer with min A using car frame and step-in technique. Provided cues for safe technique.  Gait Training:  6 Min Walk Test:  Instructed patient to ambulate as quickly and as safely as possible for 6 minutes using LRAD. Patient was allowed to take standing rest breaks without stopping the test, but if the patient required a sitting rest break the clock would be stopped and the test would be over.  Results: 160 feet (48.7 meters) without an AD with min A. Results indicate that the patient has reduced endurance with ambulation compared to age matched norms.  Age Matched Norms: 45-89  yo M: 417 meters MDC: 58.21 meters (  190.98 feet) or 50 meters (ANPTA Core Set of Outcome Measures for Adults with Neurologic Conditions, 2018)  Patient then ambulated >100 feet x2 using RW with CGA and intermittent min A on turns. Ambulated with decreased gait speed, decreased step length and height, narrow BOS with heels brushing together intermittently, forward trunk lean, and downward head gaze. Provided verbal cues for erect posture, paced breathing throughout, increased step height, and increased BOS for safety.  Patient ascended/descended 12x6" steps using B rails with CGA. Performed reciprocal gait pattern throughout. Provided cues for technique and sequencing.   Patient ambulated up/down a ramp, over 10 feet of mulch (unlevel surface), and up/down a curb to simulate community ambulation over unlevel surfaces with min A without an AD.   Wheelchair Mobility:  Patient propelled wheelchair >150 feet with x3 90 deg turns with supervision with slow cadence.   Neuromuscular Re-ed: Patient demonstrated increased fall risk noted by score of 11/56 on the Berg Balance Scale.  <45/56 = fall risk, <42/56 = predictive of recurrent falls, <40/56 = 100% fall risk  >41 = independent, 21-40 = assistive device, 0-20 = wheelchair level  MDC 6.9 (4 pts 45-56, 5 pts 35-44, 7 pts 25-34) (ANPTA Core Set of Outcome Measures for Adults with Neurologic Conditions, 2018)  Instructed pt in results of PT evaluation as detailed above, PT POC, rehab potential, rehab goals, and discharge recommendations. Additionally discussed CIR's policies regarding fall safety and use of chair alarm and/or quick release belt. Pt verbalized understanding and in agreement. Will update pt's family members as they become available.   Patient in w/c with his sister in the room at end of session with breaks locked, seat belt alarm set, and all needs within reach.    Discharge Criteria: Patient will be discharged from PT if patient  refuses treatment 3 consecutive times without medical reason, if treatment goals not met, if there is a change in medical status, if patient makes no progress towards goals or if patient is discharged from hospital.  The above assessment, treatment plan, treatment alternatives and goals were discussed and mutually agreed upon: by patient and by family  Cherie L Grunenberg PT, DPT, NCS, CBIS  10/29/2021, 2:30 PM

## 2021-10-29 NOTE — Plan of Care (Signed)
  Problem: RH Swallowing Goal: LTG Patient will consume least restrictive diet using compensatory strategies with assistance (SLP) Description: LTG:  Patient will consume least restrictive diet using compensatory strategies with assistance (SLP) Flowsheets (Taken 10/29/2021 1410) LTG: Pt Patient will consume least restrictive diet using compensatory strategies with assistance of (SLP): Modified Independent   Problem: RH Expression Communication Goal: LTG Patient will increase speech intelligibility (SLP) Description: LTG: Patient will increase speech intelligibility at word/phrase/conversation level with cues, % of the time (SLP) Flowsheets (Taken 10/29/2021 1410) LTG: Patient will increase speech intelligibility (SLP): Modified Independent Level: Conversation level Percent of time patient will use intelligible speech: 100%   Problem: RH Memory Goal: LTG Patient will demonstrate ability for day to day (SLP) Description: LTG:   Patient will demonstrate ability for day to day recall/carryover during cognitive/linguistic activities with assist  (SLP) Flowsheets (Taken 10/29/2021 1410) LTG: Patient will demonstrate ability for day to day recall: Daily complex information LTG: Patient will demonstrate ability for day to day recall/carryover during cognitive/linguistic activities with assist (SLP): Supervision Goal: LTG Patient will use memory compensatory aids to (SLP) Description: LTG:  Patient will use memory compensatory aids to recall biographical/new, daily complex information with cues (SLP) Flowsheets (Taken 10/29/2021 1410) LTG: Patient will use memory compensatory aids to (SLP): Supervision

## 2021-10-29 NOTE — Progress Notes (Addendum)
Attempted to speak to patient this am.  Patient sitting up in bed, sats 100% with PSMV in place.  Patient confirmed that going home with family. When I asked who he would be going home with him and assisting him he got upset, apologized and self reported that gets upset easily. Antony Blackbird will be caregiver.  Will try again later for education.  Patient has no interest in education at this time.

## 2021-10-29 NOTE — Progress Notes (Signed)
Inpatient Rehabilitation  Patient information reviewed and entered into eRehab system by Alexiya Franqui Yaretzy Olazabal, OTR/L, Rehab Quality Coordinator.   Information including medical coding, functional ability and quality indicators will be reviewed and updated through discharge.   

## 2021-10-29 NOTE — Plan of Care (Signed)
  Problem: RH Balance Goal: LTG Patient will maintain dynamic standing balance (PT) Description: LTG:  Patient will maintain dynamic standing balance with assistance during mobility activities (PT) Flowsheets (Taken 10/29/2021 1628) LTG: Pt will maintain dynamic standing balance during mobility activities with:: Independent with assistive device    Problem: Sit to Stand Goal: LTG:  Patient will perform sit to stand with assistance level (PT) Description: LTG:  Patient will perform sit to stand with assistance level (PT) Flowsheets (Taken 10/29/2021 1628) LTG: PT will perform sit to stand in preparation for functional mobility with assistance level: Independent with assistive device   Problem: RH Bed to Chair Transfers Goal: LTG Patient will perform bed/chair transfers w/assist (PT) Description: LTG: Patient will perform bed to chair transfers with assistance (PT). Flowsheets (Taken 10/29/2021 1628) LTG: Pt will perform Bed to Chair Transfers with assistance level: Independent with assistive device    Problem: RH Car Transfers Goal: LTG Patient will perform car transfers with assist (PT) Description: LTG: Patient will perform car transfers with assistance (PT). Flowsheets (Taken 10/29/2021 1628) LTG: Pt will perform car transfers with assist:: Supervision/Verbal cueing   Problem: RH Furniture Transfers Goal: LTG Patient will perform furniture transfers w/assist (OT/PT) Description: LTG: Patient will perform furniture transfers  with assistance (OT/PT). Flowsheets (Taken 10/29/2021 1628) LTG: Pt will perform furniture transfers with assist:: Independent with assistive device    Problem: RH Ambulation Goal: LTG Patient will ambulate in controlled environment (PT) Description: LTG: Patient will ambulate in a controlled environment, # of feet with assistance (PT). Flowsheets (Taken 10/29/2021 1628) LTG: Pt will ambulate in controlled environ  assist needed:: Supervision/Verbal  cueing LTG: Ambulation distance in controlled environment: >360 feet on 6MWT to meet MCID Goal: LTG Patient will ambulate in home environment (PT) Description: LTG: Patient will ambulate in home environment, # of feet with assistance (PT). Flowsheets (Taken 10/29/2021 1628) LTG: Pt will ambulate in home environ  assist needed:: Independent with assistive device   Problem: RH Stairs Goal: LTG Patient will ambulate up and down stairs w/assist (PT) Description: LTG: Patient will ambulate up and down # of stairs with assistance (PT) Flowsheets (Taken 10/29/2021 1628) LTG: Pt will ambulate up/down stairs assist needed:: Supervision/Verbal cueing LTG: Pt will  ambulate up and down number of stairs: 12 step using 1-2 rails per home set-up   Problem: RH Memory Goal: LTG Patient will demonstrate ability for day to day recall/carry over during activities of daily living with assistance level (PT) Description: LTG:  Patient will demonstrate ability for day to day recall/carry over during activities of daily living with assistance level (PT). Flowsheets (Taken 10/29/2021 1628) LTG:  Patient will demonstrate ability for day to day recall/carry over during activities of daily living with assistance level (PT): Modified Independent

## 2021-10-29 NOTE — Progress Notes (Signed)
PROGRESS NOTE   Subjective/Complaints: Pt reports a pretty good night. Sitting eob eating his breakfast. Intermittent secretions thru trach. Denies pain  ROS: Patient denies fever, rash, sore throat, blurred vision, dizziness, nausea, vomiting, diarrhea, cough, shortness of breath or chest pain, joint or back/neck pain, headache, or mood change.    Objective:   No results found. Recent Labs    10/29/21 0725  WBC 8.2  HGB 10.6*  HCT 32.7*  PLT 352   Recent Labs    10/29/21 0725  NA 135  K 4.1  CL 104  CO2 22  GLUCOSE 96  BUN 18  CREATININE 0.72  CALCIUM 10.0    Intake/Output Summary (Last 24 hours) at 10/29/2021 0826 Last data filed at 10/29/2021 0818 Gross per 24 hour  Intake 298 ml  Output 525 ml  Net -227 ml        Physical Exam: Vital Signs Blood pressure (!) 157/61, pulse 93, temperature 98.6 F (37 C), temperature source Oral, resp. rate 16, height '5\' 11"'$  (1.803 m), weight 76.4 kg, SpO2 100 %.  General: Alert and oriented x 3, No apparent distress HEENT: Head is normocephalic, atraumatic, PERRLA, EOMI, sclera anicteric. Ongoing facial edema, missing teeth.  Neck: #6 trach with mucous inside, open to air. Speech dysphonic but intelligible. Able to talk around trach without pmv Heart: Reg rate and rhythm. No murmurs rubs or gallops Chest: CTA bilaterally without wheezes, rales, or rhonchi; no distress. A few upper airway sounds Abdomen: Soft, non-tender, non-distended, bowel sounds positive. Extremities: No clubbing, cyanosis, or edema. Pulses are 2+ Psych: Pt's affect is appropriate. Pt is cooperative Skin: surgical wounds cdi. Old wounds on legs.  Neuro:  Alert and oriented x 3. Normal insight and awareness. Intact Memory. Normal language. Cranial nerve exam unremarkable, speech dysartrhic. UE motor 4-5/5. LUE 4+/5 prox to distal. No focal sensory findings. No abnl tone.  Musculoskeletal: good  posture, no pain with rom.     Assessment/Plan: 1. Functional deficits which require 3+ hours per day of interdisciplinary therapy in a comprehensive inpatient rehab setting. Physiatrist is providing close team supervision and 24 hour management of active medical problems listed below. Physiatrist and rehab team continue to assess barriers to discharge/monitor patient progress toward functional and medical goals  Care Tool:  Bathing              Bathing assist       Upper Body Dressing/Undressing Upper body dressing        Upper body assist      Lower Body Dressing/Undressing Lower body dressing            Lower body assist       Toileting Toileting    Toileting assist       Transfers Chair/bed transfer  Transfers assist           Locomotion Ambulation   Ambulation assist              Walk 10 feet activity   Assist           Walk 50 feet activity   Assist           Walk 150  feet activity   Assist           Walk 10 feet on uneven surface  activity   Assist           Wheelchair     Assist               Wheelchair 50 feet with 2 turns activity    Assist            Wheelchair 150 feet activity     Assist          Blood pressure (!) 157/61, pulse 93, temperature 98.6 F (37 C), temperature source Oral, resp. rate 16, height '5\' 11"'$  (1.803 m), weight 76.4 kg, SpO2 100 %.  Medical Problem List and Plan: 1. 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  Antithrombotics: -DVT/anticoagulation:  SCD             -antiplatelet therapy: N/A 3. Pain Management:  Hydrocodone prn.  4. Mood/Behavior/Sleep: LCSW to follow for evaluation and support.              -antipsychotic agents: N/a 5.  Neuropsych/cognition: This patient is capable of making decisions on his own behalf. 6. Skin/Wound Care: Routine pressure relief measures.             -- Per ENT, The flap under the tongue is evolving with some necrosis of the skin that may later require debridement.            -- No special wound care instructions   -pt is tolerating current diet without pain 7. Fluids/Electrolytes/Nutrition: Monitor I/O. Continue            -- Dysphagia 1 diet with thins, tolerating   -I personally reviewed all of the patient's labs today, and lab work is within normal limits except for low albumin---protein supp  -pt seems to have good appetite.  8. HTN: Monitor BP TID. Continue hydralazine and coreg.  9. ABLA: stable 12-11. Admission labs.  10. Leucocytosis: WBC trending down---8.2 on 10/18             --no fevers or other signs of infection.              --encourage pulmonary hygiene             -- s/p 7 days Unasyn 11. Radical neck dissection with trach: Lives alone in an apartment. --Ordered trach team for education on trach care.  -- Friends to help out after d/c?  -- Tolerating [place] #6 with PMV. Suspect that the plan will be to keep trach in until after discharge. Will confirm with ENT    LOS: 1 days A FACE TO [place] 10/29/2021, 8:26 AM

## 2021-10-29 NOTE — Evaluation (Signed)
Occupational Therapy Assessment and Plan  Patient Details  Name: Nathaniel Hicks MRN: [id number] Date of Birth: February 13, 1940  OT Diagnosis: muscular wasting and disuse atrophy and muscle weakness (generalized) Rehab Potential: Rehab Potential (ACUTE ONLY): Excellent ELOS: 7 days   Today's Date: 10/29/2021 OT Individual Time: 1030-1130 OT Individual Time Calculation (min): 60 min     Hospital Problem: Principal Problem:   Debility   Past Medical History:  Past Medical History:  Diagnosis Date   Arthritis    BPH (benign prostatic hyperplasia)    Difficult intubation    HOH (hard of hearing)    Hyperlipidemia 11/02/2017   Hypertension    Hypertension 11/02/2017   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[place])    2014   Vitamin D deficiency 11/02/2017   Past Surgical History:  Past Surgical History:  Procedure Laterality Date   COLONOSCOPY N/A 11/24/2012   Procedure: COLONOSCOPY;  Surgeon: Rogene Houston, MD;  [facility];  Service: Endoscopy;  Laterality: N/A;  830-moved to [place] notified pt   FLOOR OF MOUTH BIOPSY N/A 10/14/2021   Procedure: FLOOR OF MOUTH RESECTION;  Surgeon: Melida Quitter, MD;  Location: [place];  Service: ENT;  Laterality: N/A;   HEMORROIDECTOMY     KNEE ARTHROSCOPY WITH LATERAL MENISECTOMY Right 08/31/2017   Procedure: KNEE ARTHROSCOPY WITH LATERAL MENISECTOMY;  Surgeon: Carole Civil, MD;  Location: [facility];  Service: Orthopedics;  Laterality: Right;   LESION EXCISION N/A 03/23/2012   Procedure: EXCISION NEOPLASM SCALP ;  Surgeon: Jamesetta So, MD;  Location: [facility];  Service: General;  Laterality: N/A;  Excision of Scalp Neoplasm   ORCHIECTOMY Right 12/20/2012   Procedure: RIGHT RADICAL ORCHIECTOMY/POSSIBLE BX RIGHT TESTICLE;  Surgeon: Marissa Nestle, MD;  Location: [facility];  Service: Urology;  Laterality: Right;   PROSTATE SURGERY     RADICAL NECK DISSECTION Bilateral 10/14/2021   Procedure: NECK DISSECTION;  Surgeon: Melida Quitter, MD;  Location: [place];   Service: ENT;  Laterality: Bilateral;   SCALP LACERATION REPAIR     [facility]-Dr Tamala Julian   SKIN FULL THICKNESS GRAFT Bilateral 10/14/2021   Procedure: PLATYSMA FLAP CLOSURE;  Surgeon: Melida Quitter, MD;  Location: [place];  Service: ENT;  Laterality: Bilateral;   TOOTH EXTRACTION  10/14/2021   Procedure: DENTAL EXTRACTIONS;  Surgeon: Melida Quitter, MD;  Location: [place];  Service: ENT;;   TRACHEOSTOMY TUBE PLACEMENT N/A 10/14/2021   Procedure: TRACHEOSTOMY;  Surgeon: Melida Quitter, MD;  Location: [place];  Service: ENT;  Laterality: N/A;    Assessment & Plan Clinical Impression: .Nathaniel Hicks is an 81 year old male with history of testicular cancer, BPH, HTN, Vit D deficiency, HOH who 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 Redmond Baseman. Post op NPO with NGT for decompression due to nausea/vomiting but noted to have coffee ground drainage in NGT and developed fevers due to SIRS on 10/05. He was stated on IV antibiotics due to concerns aspiration PNA and/or wound infection.  NGT was pulled out and patient refused replacement but N/V resolved and cortak placed of nutritional support. Scopolamine patch added to  manage copious secretion from trach and he was started on PMSV trials.    BSS showed mild oral dysphagia and has been advanced to  D1, thins. He is tolerating PMSV during daytime hours and cortak removed on 10/12 with  calorie count ongoing. Marland Kitchen PT/OT has been working with patient who continues to be limited by weakness and tachycardia with activity due to deconditioning. CIR recommended due to functional decline.   Patient transferred to CIR on 10/28/2021 .    Patient currently requires min with basic self-care skills secondary to muscle weakness, decreased cardiorespiratoy endurance, and decreased standing balance and decreased balance strategies.  Prior  to hospitalization, patient could complete ADLs with modified independent .  Patient will benefit from skilled intervention to increase independence with basic self-care skills prior to discharge home with care partner.  Anticipate patient will require intermittent supervision and follow up home health.  OT - End of Session Activity Tolerance: Tolerates 30+ min activity with multiple rests OT Assessment Rehab Potential (ACUTE ONLY): Excellent OT Patient demonstrates impairments in the following area(s): Balance;Endurance;Motor OT Basic ADL's Functional Problem(s): Dressing;Toileting;Bathing;Grooming OT Advanced ADL's Functional Problem(s): Simple Meal Preparation OT Transfers Functional Problem(s): Toilet;Tub/Shower OT Plan OT Intensity: Minimum of 1-2 x/day, 45 to 90 minutes OT Frequency: 5 out of 7 days OT Duration/Estimated Length of Stay: 7 days OT Treatment/Interventions: Teacher, English as a foreign language;Discharge planning;Functional mobility training;Psychosocial support;Patient/family education;Self Care/advanced ADL retraining;Therapeutic Activities;Therapeutic Exercise OT Self Feeding Anticipated Outcome(s): independent OT Basic Self-Care Anticipated Outcome(s): mod I with grooming and dressing; supervision with bathing OT Toileting Anticipated Outcome(s): mod I OT Bathroom Transfers Anticipated Outcome(s): mod I to toilet, S to tub/sh OT Recommendation Patient destination: Home Follow Up Recommendations: Home health OT Equipment Recommended: Tub/shower seat   OT Evaluation Precautions/Restrictions  Precautions Precautions: Fall Precaution Comments: trach Other Brace: PMV during therapy Restrictions Weight Bearing Restrictions: No    Vital Signs Therapy Vitals Pulse Rate: 82 Resp: 14 Oxygen Therapy SpO2: 96 % (ATC is on but patient is currently on RA in wheelchair working with PT. No distress noted.) O2 Device: Tracheostomy Collar O2  Flow Rate (L/min): 5 L/min FiO2 (%): 28 % Pain Pain Assessment Pain Scale: 0-10 Pain Score: 0-No pain Home Living/Prior Functioning Home Living Available Help at Discharge: Family Type of Home: Apartment Home Access: Elevator, Level entry Home Layout: One level Bathroom Shower/Tub: Chiropodist: Standard Additional Comments: Sister and brother can come every day per patient  Lives With: Alone Prior Function Level of Independence: Independent with basic ADLs, Independent with homemaking with ambulation, Independent with gait, Independent with transfers, Requires assistive device for independence Driving: No Vocation: Retired Surveyor, mining Baseline Vision/History: 1 Wears glasses (bifocals) Ability to See in Adequate Light: 0 Adequate Patient Visual Report: No change from baseline Vision Assessment?: No apparent visual deficits Perception  Perception: Within Functional Limits Praxis Praxis: Intact Cognition Cognition Overall Cognitive Status: Within Functional Limits for tasks assessed Arousal/Alertness: Awake/alert Orientation Level: Person;Place;Situation Person: Oriented Place: Oriented Situation: Oriented Memory: Appears intact Awareness: Appears intact Problem Solving: Appears intact Safety/Judgment: Appears intact Brief Interview for Mental Status (BIMS) Repetition of Three Words (First Attempt): 3 Temporal Orientation: Year: Correct Temporal Orientation: Month: Accurate within 5 days Temporal Orientation: Day: Correct Recall: "Sock": Yes, no cue required Recall: "Blue": Yes, no cue required Recall: "Bed": No, could not recall BIMS Summary Score: 13 Sensation Sensation Light Touch: Appears Intact Hot/Cold: Appears Intact Proprioception: Appears Intact Stereognosis: Not tested Coordination Gross Motor Movements are Fluid and Coordinated: No Fine Motor Movements are Fluid and Coordinated: Yes Coordination and Movement Description: limited by  arthritis, slower movement patterns but functional for self care tasks Finger Nose Finger Test: [facility] Motor  Motor Motor - Skilled Clinical Observations: generalized weakness  Trunk/Postural Assessment  Cervical Assessment Cervical Assessment: Within Functional Limits Thoracic Assessment Thoracic Assessment: Within Functional Limits Lumbar Assessment Lumbar Assessment: Within Functional Limits Postural Control Postural Control: Within Functional Limits  Balance Static Standing Balance Static Standing - Level of Assistance: 5: Stand by assistance Dynamic Standing Balance Dynamic Standing - Level of Assistance: 4: Min assist Extremity/Trunk Assessment RUE Assessment RUE Assessment: Within Functional Limits Active Range of Motion (AROM) Comments: WFL LUE Assessment LUE Assessment: Within Functional Limits  Care Tool Care Tool Self Care Eating   Eating Assist Level: Set up assist    Oral Care    Oral Care Assist Level: Supervision/Verbal cueing    Bathing   Body parts bathed by patient: Right arm;Left arm;Chest;Abdomen;Front perineal area;Buttocks;Right upper leg;Face;Left lower leg;Right lower leg;Left upper leg     Assist Level: Contact Guard/Touching assist    Upper Body Dressing(including orthotics)   What is the patient wearing?: Pull over shirt;Button up shirt   Assist Level: Set up assist    Lower Body Dressing (excluding footwear)   What is the patient wearing?: Incontinence brief;Pants Assist for lower body dressing: Minimal Assistance - Patient > 75%    Putting on/Taking off footwear   What is the patient wearing?: Socks;Shoes Assist for footwear: Minimal Assistance - Patient > 75%       Care Tool Toileting Toileting activity   Assist for toileting: Minimal Assistance - Patient > 75%     Care Tool Bed Mobility Roll left and right activity   Roll left and right assist level: Supervision/Verbal cueing    Sit to lying activity        Lying to sitting  on side of bed activity   Lying to sitting on side of bed assist level: the ability to move from lying on the back to sitting on the side of the bed with no back support.: Supervision/Verbal cueing     Care Tool Transfers Sit to stand transfer   Sit to stand assist level: Contact Guard/Touching assist    Chair/bed transfer   Chair/bed transfer assist level: Minimal Assistance - Patient > 75%     Toilet transfer   Assist Level: Minimal Assistance - Patient > 75%     Care Tool Cognition  Expression of Ideas and Wants Expression of Ideas and Wants: 4. Without difficulty (complex and basic) - expresses complex messages without difficulty and with speech that is clear and easy to understand  Understanding Verbal and Non-Verbal Content Understanding Verbal and Non-Verbal Content: 4. Understands (complex and basic) - clear comprehension without cues or repetitions   Memory/Recall Ability Memory/Recall Ability : Current season;That he or she is in a hospital/hospital unit   Refer to Care Plan for Independence 1 OT Short Term Goal 1 (Week 1): STGs = LTGs  Recommendations for other services: None    Skilled Therapeutic Intervention ADL ADL Eating: Set up Grooming: Supervision/safety Upper Body Bathing: Supervision/safety Where Assessed-Upper Body Bathing: Sitting at sink Lower Body Bathing: Minimal assistance Where Assessed-Lower Body Bathing: Sitting at sink Upper Body Dressing: Setup Where Assessed-Upper Body Dressing: Sitting at sink Lower Body Dressing: Minimal assistance Where Assessed-Lower Body Dressing: Sitting at sink;Standing at sink Toileting: Minimal assistance Where Assessed-Toileting: Glass blower/designer: Psychiatric nurse Method: Arts development officer: Raised toilet seat Mobility  Transfers Sit to Stand: Contact Guard/Touching assist Stand to Sit: Contact Guard/Touching assist   Pt seen for initial  evaluation and ADL training.  Explained role of OT  and end of session reviewed pt's goals and ELOS. Pt participated extremely well demonstrating good strength and endurance for his first day of rehab.  In standing and stepping with RW to Wc he was somewhat unsteady needing min A.  His gait pattern appeared slightly ataxic due to decreased LE strength.  Pt completed self care with bath at sink. Resting in wc with belt alarm on and all needs met.   Discharge Criteria: Patient will be discharged from OT if patient refuses treatment 3 consecutive times without medical reason, if treatment goals not met, if there is a change in medical status, if patient makes no progress towards goals or if patient is discharged from hospital.  The above assessment, treatment plan, treatment alternatives and goals were discussed and mutually agreed upon: by patient  [facility] 10/29/2021, 12:11 PM

## 2021-10-29 NOTE — Plan of Care (Signed)
  Problem: RH Balance Goal: LTG Patient will maintain dynamic standing with ADLs (OT) Description: LTG:  Patient will maintain dynamic standing balance with assist during activities of daily living (OT)  Flowsheets (Taken 10/29/2021 1227) LTG: Pt will maintain dynamic standing balance during ADLs with: Independent with assistive device   Problem: Sit to Stand Goal: LTG:  Patient will perform sit to stand in prep for activites of daily living with assistance level (OT) Description: LTG:  Patient will perform sit to stand in prep for activites of daily living with assistance level (OT) Flowsheets (Taken 10/29/2021 1227) LTG: PT will perform sit to stand in prep for activites of daily living with assistance level: Independent with assistive device   Problem: RH Grooming Goal: LTG Patient will perform grooming w/assist,cues/equip (OT) Description: LTG: Patient will perform grooming with assist, with/without cues using equipment (OT) Flowsheets (Taken 10/29/2021 1227) LTG: Pt will perform grooming with assistance level of: Independent with assistive device    Problem: RH Bathing Goal: LTG Patient will bathe all body parts with assist levels (OT) Description: LTG: Patient will bathe all body parts with assist levels (OT) Flowsheets (Taken 10/29/2021 1227) LTG: Pt will perform bathing with assistance level/cueing: Supervision/Verbal cueing LTG: Position pt will perform bathing: Shower   Problem: RH Dressing Goal: LTG Patient will perform upper body dressing (OT) Description: LTG Patient will perform upper body dressing with assist, with/without cues (OT). Flowsheets (Taken 10/29/2021 1227) LTG: Pt will perform upper body dressing with assistance level of: Independent Goal: LTG Patient will perform lower body dressing w/assist (OT) Description: LTG: Patient will perform lower body dressing with assist, with/without cues in positioning using equipment (OT) Flowsheets (Taken 10/29/2021  1227) LTG: Pt will perform lower body dressing with assistance level of: Independent with assistive device   Problem: RH Toileting Goal: LTG Patient will perform toileting task (3/3 steps) with assistance level (OT) Description: LTG: Patient will perform toileting task (3/3 steps) with assistance level (OT)  Flowsheets (Taken 10/29/2021 1227) LTG: Pt will perform toileting task (3/3 steps) with assistance level: Independent with assistive device   Problem: RH Simple Meal Prep Goal: LTG Patient will perform simple meal prep w/assist (OT) Description: LTG: Patient will perform simple meal prep with assistance, with/without cues (OT). Flowsheets (Taken 10/29/2021 1227) LTG: Pt will perform simple meal prep with assistance level of: Supervision/Verbal cueing LTG: Pt will perform simple meal prep w/level of: Ambulate with device   Problem: RH Toilet Transfers Goal: LTG Patient will perform toilet transfers w/assist (OT) Description: LTG: Patient will perform toilet transfers with assist, with/without cues using equipment (OT) Flowsheets (Taken 10/29/2021 1227) LTG: Pt will perform toilet transfers with assistance level of: Independent with assistive device   Problem: RH Tub/Shower Transfers Goal: LTG Patient will perform tub/shower transfers w/assist (OT) Description: LTG: Patient will perform tub/shower transfers with assist, with/without cues using equipment (OT) Flowsheets (Taken 10/29/2021 1227) LTG: Pt will perform tub/shower stall transfers with assistance level of: Supervision/Verbal cueing LTG: Pt will perform tub/shower transfers from: Tub/shower combination

## 2021-10-29 NOTE — Evaluation (Signed)
Speech Language Pathology Assessment and Plan  Patient Details  Name: Nathaniel Hicks MRN: [id number] Date of Birth: 11/19/1940  SLP Diagnosis: Dysphagia;Dysarthria;Cognitive Impairments  Rehab Potential: Excellent ELOS: 7 days    Today's Date: 10/29/2021 SLP Individual Time: 9417-4081 SLP Individual Time Calculation (min): 60 min   Hospital Problem: Principal Problem:   Debility  Past Medical History:  Past Medical History:  Diagnosis Date   Arthritis    BPH (benign prostatic hyperplasia)    Difficult intubation    HOH (hard of hearing)    Hyperlipidemia 11/02/2017   Hypertension    Hypertension 11/02/2017   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 notified pt   FLOOR OF MOUTH BIOPSY N/A 10/14/2021   Procedure: FLOOR OF MOUTH RESECTION;  Surgeon: Melida Quitter, MD;  Location: [place];  Service: ENT;  Laterality: N/A;   HEMORROIDECTOMY     KNEE ARTHROSCOPY WITH LATERAL MENISECTOMY Right 08/31/2017   Procedure: KNEE ARTHROSCOPY WITH LATERAL MENISECTOMY;  Surgeon: Carole Civil, MD;  Location: [facility];  Service: Orthopedics;  Laterality: Right;   LESION EXCISION N/A 03/23/2012   Procedure: EXCISION NEOPLASM SCALP ;  Surgeon: Jamesetta So, MD;  Location: [facility];  Service: General;  Laterality: N/A;  Excision of Scalp Neoplasm   ORCHIECTOMY Right 12/20/2012   Procedure: RIGHT RADICAL ORCHIECTOMY/POSSIBLE BX RIGHT TESTICLE;  Surgeon: Marissa Nestle, MD;  Location: [facility];  Service: Urology;  Laterality: Right;   PROSTATE SURGERY     RADICAL NECK DISSECTION Bilateral 10/14/2021   Procedure: NECK DISSECTION;  Surgeon: Melida Quitter, MD;  Location: [place];  Service: ENT;  Laterality: Bilateral;   SCALP  LACERATION REPAIR     [facility]-Dr Tamala Julian   SKIN FULL THICKNESS GRAFT Bilateral 10/14/2021   Procedure: PLATYSMA FLAP CLOSURE;  Surgeon: Melida Quitter, MD;  Location: [place];  Service: ENT;  Laterality: Bilateral;   TOOTH EXTRACTION  10/14/2021   Procedure: DENTAL EXTRACTIONS;  Surgeon: Melida Quitter, MD;  Location: [place];  Service: ENT;;   TRACHEOSTOMY TUBE PLACEMENT N/A 10/14/2021   Procedure: TRACHEOSTOMY;  Surgeon: Melida Quitter, MD;  Location: [facility] OR;  Service: ENT;  Laterality: N/A;    Assessment / Plan / Recommendation Clinical Impression Patient is an 81 year old male with history of testicular cancer, BPH, HTN, Vit D deficiency, HOH who 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 Redmond Baseman. Post op NPO with NGT for decompression due to nausea/vomiting but noted to have coffee ground drainage in NGT and developed fevers due to SIRS on 10/05. He was started on IV antibiotics due to concerns aspiration PNA and/or wound infection.  NGT was pulled out and patient refused replacement but N/V resolved and cortak placed of nutritional support. Scopolamine patch added to  manage copious secretion from trach and he was started on PMSV trials. BSE showed mild oral dysphagia and has been advanced to  Dys. 1 textures with thin liquids. He is tolerating PMSV during daytime hours. PT/OT has been working with patient who continues to be limited by weakness and tachycardia with  activity due to deconditioning. CIR recommended due to functional decline. Patient admitted 10/28/21.  Upon arrival, patient was awake in bed without the PMSV in place. Patient reported he "forget" to donn the PMSV prior to eating his breakfast meal. SLP provided education to the patient and the nursing staff regarding the importance of having the PMSV in place prior to any intake.  Both  verbalized understanding.  Patient reported fear of losing the PMSV as it sometimes falls into his bed. SLP donned the "leash" and attached it to the flange of the trach. SLP provided Max A multimodal cues to teach patient how to donn PMSV with use of a mirror. Patient with minimal success with reports of difficulty seeing, therefore, staff will need to continue to assist patient with donning with continued practice with SLP. Patient reported he had his glasses present but didn't want to use them due to fear of losing them. SLP provided the patient with a case for his glasses and a "designated place" to keep them consistently to minimize risk of losing them. Patient tolerated the PMSV throughout the entirety of the session with all vitals remaining WFL, without signs of distress or air trapping and with adequate breath support for communication.  Recommend patient wear the PMSV during all waking hours with intermittent supervision from staff. Patient demonstrated a mild dysarthria due to missing dentition and decreased lingual ROM, however, patient was ~90% intelligible at the sentence level.  Patient consumed thin liquids via cup without overt s/s of aspiration with Min verbal cues needed to decrease rate of intake. Patient also consumed pureed textures without overt s/s of aspiration or pharyngeal residue. Patient declined trials of upgraded textures as he feels he is not ready to chew at this time with SLP in agreement. However, SLP did provided education on how to alter the textures to make them thinner as well as improve overall taste/appearance. Recommend patient continue current diet of Dys. 1 textures with thin liquids with intermittent supervision. Patient's overall cognitive functioning appeared [facility] for all tasks assessed, however, mild deficits in short-term recall of functional information noted.    Patient would benefit from skilled SLP intervention to maximize his swallowing function, functional  communication and cognitive functioning prior to discharge.     Skilled Therapeutic Interventions          Administered a PMSV evaluation, BSE, and cognitive-linguistic evaluation. Please see above for details.   SLP Assessment  Patient will need skilled Speech Lanaguage Pathology Services during CIR admission    Recommendations  Patient may use Passy-Muir Speech Valve: During all waking hours (remove during sleep);During PO intake/meals;Intermittently with supervision PMSV Supervision: Intermittent SLP Diet Recommendations: Dysphagia 1 (Puree);Thin Liquid Administration via: Cup Medication Administration: Crushed with puree Supervision: Patient able to self feed;Intermittent supervision to cue for compensatory strategies Compensations: Slow rate;Small sips/bites;Follow solids with liquid Postural Changes and/or Swallow Maneuvers: Seated upright 90 degrees Oral Care Recommendations: Oral care BID Patient destination: Home Follow up Recommendations: Outpatient SLP Equipment Recommended: To be determined    SLP Frequency 3 to 5 out of 7 days   SLP Duration  SLP Intensity  SLP Treatment/Interventions 7 days  Minumum of 1-2 x/day, 30 to 90 minutes  Cognitive remediation/compensation;Dysphagia/aspiration precaution training;Speech/Language facilitation;Therapeutic Activities;Environmental controls;Cueing hierarchy;Functional tasks;Patient/family education    Pain Pain Assessment Pain Score: 0-No pain  Prior Functioning Type of Home: Apartment  Lives With: Alone Available Help at Discharge: Family Vocation: Retired  SLP Evaluation Cognition Overall Cognitive Status: Impaired/Different from baseline Arousal/Alertness:  Awake/alert Orientation Level: Oriented X4 Memory: Impaired Memory Impairment: Decreased short term memory Decreased Short Term Memory: Functional basic Awareness: Appears intact Problem Solving: Appears intact Safety/Judgment: Appears intact   Comprehension Auditory Comprehension Overall Auditory Comprehension: Appears within functional limits for tasks assessed Expression Expression Primary Mode of Expression: Verbal Verbal Expression Overall Verbal Expression: Appears within functional limits for tasks assessed Written Expression Dominant Hand: Right Oral Motor Oral Motor/Sensory Function Overall Oral Motor/Sensory Function: Mild impairment Facial ROM: Within Functional Limits Facial Symmetry: Within Functional Limits Facial Strength: Within Functional Limits Lingual ROM: Reduced right;Reduced left Lingual Symmetry: Within Functional Limits Lingual Strength: Reduced Lingual Sensation: Reduced Mandible: Within Functional Limits Motor Speech Overall Motor Speech: Impaired Respiration: Within functional limits Phonation: Normal Resonance: Within functional limits Articulation: Impaired Intelligibility: Intelligibility reduced Word: 75-100% accurate Phrase: 75-100% accurate Sentence: 75-100% accurate Conversation: 75-100% accurate Motor Planning: Witnin functional limits  Care Tool Care Tool Cognition Ability to hear (with hearing aid or hearing appliances if normally used Ability to hear (with hearing aid or hearing appliances if normally used): 1. Minimal difficulty - difficulty in some environments (e.g. when person speaks softly or setting is noisy)   Expression of Ideas and Wants Expression of Ideas and Wants: 3. Some difficulty - exhibits some difficulty with expressing needs and ideas (e.g, some words or finishing thoughts) or speech is not clear   Understanding Verbal and Non-Verbal Content Understanding Verbal and Non-Verbal Content: 3. Usually understands - understands most conversations, but misses some part/intent of message. Requires cues at times to understand  Memory/Recall Ability Memory/Recall Ability : Current season;That he or she is in a hospital/hospital unit   PMSV Assessment  PMSV  Trial PMSV was placed for: 60 minutes Able to redirect subglottic air through upper airway: Yes Able to Attain Phonation: Yes Voice Quality: Normal Able to Expectorate Secretions: No attempts Level of Secretion Expectoration with PMSV: Not observed Breath Support for Phonation: Adequate Intelligibility: Intelligibility reduced Word: 75-100% accurate Phrase: 75-100% accurate Sentence: 75-100% accurate Conversation: 75-100% accurate SpO2 During Trial: 98 % Pulse During Trial: 89 Behavior: Alert;Cooperative;Responsive to questions  Bedside Swallowing Assessment General Date of Onset: 10/20/21 Previous Swallow Assessment: BSE on acute, first on thin liquids but upgraded to Dys. 1 textures with thin liquids Diet Prior to this Study: Dysphagia 1 (puree);Thin liquids Temperature Spikes Noted: No Respiratory Status: Trach Trach Size and Type: #6;Uncuffed;With PMSV in place History of Recent Intubation: No Behavior/Cognition: Alert;Cooperative Oral Cavity - Dentition: Missing dentition Self-Feeding Abilities: Able to feed self;Needs assist Vision: Functional for self-feeding Patient Positioning: Upright in bed Baseline Vocal Quality: Normal Volitional Cough: Strong Volitional Swallow: Able to elicit   Ice Chips Ice chips: Not tested Thin Liquid Thin Liquid: Within functional limits Presentation: Cup Nectar Thick Nectar Thick Liquid: Not tested Honey Thick Honey Thick Liquid: Not tested Puree Puree: Within functional limits Presentation: Self Fed;Spoon Solid Solid: Not tested BSE Assessment Risk for Aspiration Impact on safety and function: Mild aspiration risk;Risk for inadequate nutrition/hydration  Short Term Goals: Week 1: SLP Short Term Goal 1 (Week 1): STGs=LTGs due to ELOS  Refer to Care Plan for Long Term Goals  Recommendations for other services: None   Discharge Criteria: Patient will be discharged from SLP if patient refuses treatment 3 consecutive times  without medical reason, if treatment goals not met, if there is a change in medical status, if patient makes no progress towards goals or if patient is discharged from hospital.  The above assessment, treatment plan, treatment alternatives and  goals were discussed and mutually agreed upon: by patient  Nahmir Zeidman 10/29/2021, 2:10 PM

## 2021-10-30 NOTE — IPOC Note (Signed)
Overall Plan of Care [facility]) Patient Details Name: Nathaniel Hicks MRN: [id number] DOB: 1940/04/18  Admitting Diagnosis: Debility after radical neck dissection  Hospital Problems: Principal Problem:   Debility     Functional Problem List: Nursing Bladder, Bowel, Endurance, Medication Management, Safety, Nutrition, Pain, Other (comment) (trach)  PT Balance, Behavior, Pain, Edema, Safety, Endurance, Motor, Skin Integrity, Nutrition  OT Balance, Endurance, Motor  SLP Cognition, Safety, Linguistic, Nutrition  TR         Basic ADL's: OT Dressing, Toileting, Bathing, Grooming     Advanced  ADL's: OT Simple Meal Preparation     Transfers: PT Bed Mobility, Bed to Chair, Car, Manufacturing systems engineer, Metallurgist: PT Ambulation, Emergency planning/management officer, Stairs     Additional Impairments: OT    SLP Swallowing, Communication, Social Cognition expression Memory  TR      Anticipated Outcomes Item Anticipated Outcome  Self Feeding independent  Swallowing  Mod I   Basic self-care  mod I with grooming and dressing; supervision with bathing  Toileting  mod I   Bathroom Transfers mod I to toilet, S to tub/sh  Bowel/Bladder  manage bowel and bladder w Mod I assist  Transfers  mod I using LRAD  Locomotion  mod I household, supervision community using LRAD  Communication  Mod I  Cognition  Supervision  Pain  < 4 with prns  Safety/Judgment  manage w cues   Therapy Plan: PT Intensity: Minimum of 1-2 x/day ,45 to 90 minutes PT Frequency: 5 out of 7 days PT Duration Estimated Length of Stay: 7 days OT Intensity: Minimum of 1-2 x/day, 45 to 90 minutes OT Frequency: 5 out of 7 days OT Duration/Estimated Length of Stay: 7 days SLP Intensity: Minumum of 1-2 x/day, 30 to 90 minutes SLP Frequency: 3 to 5 out of 7 days SLP Duration/Estimated Length of Stay: 7 days   Team Interventions: Nursing Interventions Bladder Management, Disease Management/Prevention,  Medication Management, Discharge Planning, Pain Management, Bowel Management, Patient/Family Education, Dysphagia/Aspiration Precaution Training  PT interventions Ambulation/gait training, Cognitive remediation/compensation, Discharge planning, DME/adaptive equipment instruction, Functional mobility training, Pain management, Psychosocial support, Splinting/orthotics, Therapeutic Activities, UE/LE Strength taining/ROM, Wheelchair propulsion/positioning, UE/LE Coordination activities, Therapeutic Exercise, Stair training, Skin care/wound management, Patient/family education, Neuromuscular re-education, Functional electrical stimulation, Disease management/prevention, Academic librarian, Training and development officer  OT Interventions Training and development officer, DME/adaptive equipment instruction, Discharge planning, Functional mobility training, Psychosocial support, Patient/family education, Self Care/advanced ADL retraining, Therapeutic Activities, Therapeutic Exercise  SLP Interventions Cognitive remediation/compensation, Dysphagia/aspiration precaution training, Speech/Language facilitation, Therapeutic Activities, Environmental controls, Cueing hierarchy, Functional tasks, Patient/family education  TR Interventions    SW/CM Interventions Discharge Planning, Psychosocial Support, Patient/Family Education   Barriers to Discharge MD  Medical stability  Nursing Decreased caregiver support, Wound Care, Trach 1 level level entry solo; sister to check in  PT Behavior, New oxygen, Lack of/limited family support patient wants to be mod I at d/c, demos mild memory recall deficits with novel information, mild imparements in safety awareness with AD, Required 21% FiO2 via trach collar  OT      SLP      SW Decreased caregiver support, Lack of/limited family support, Patent examiner Discharge Planning: Destination: PT-Home ,OT- Home , SLP-Home Projected Follow-up: PT-Outpatient PT, OT-  Home health  OT, SLP-Outpatient SLP Projected Equipment Needs: PT-Rolling walker with 5" wheels, OT- Tub/shower seat, SLP-To be determined Equipment Details: PT-has RW and SPC, OT-  Patient/family involved in discharge planning: PT- Patient,  OT-Patient,  SLP-Patient  MD ELOS: 7 days Medical Rehab Prognosis:  Excellent Assessment: The patient has been admitted for CIR therapies with the diagnosis of debility after radical neck dissection for cancer of mouth. The team will be addressing functional mobility, strength, stamina, balance, safety, adaptive techniques and equipment, self-care, bowel and bladder mgt, patient and caregiver education, speech, swallowing, communication, trach care, community reentry. Goals have been set at mod I with mobility, self-care and communication/trach care/swallowing. Anticipated discharge destination is home.        See Team Conference Notes for weekly updates to the plan of care

## 2021-10-30 NOTE — Progress Notes (Addendum)
RT at bedside to provide trach education to the family of Nathaniel Hicks, His sister Nathaniel Hicks was very eager to learn and wiling to take care of her brother. RT was able to teach her how to remove and replace inner cannula, along with removing and replacing speaking valve.  RT was able to teach her how to clean around trach site as well.  RT was able to observe family member Insurance risk surveyor)  perform each task. Nathaniel Hicks states she feels good with the tasks that she learned  today  and will review the learning packet.  She also wants a few more hands on sessions.  Nathaniel Hicks and her sister Nathaniel Hicks will be  assisting with  taking care her their brother.  He next possible time at the hospital will be Tuesday of next week.   We will contact her to set up times for next week session.  We also have time slotted for hands on training with the other sister Nathaniel Hicks on 10/31/2021 at 1pm tp 3pm.  If family needs to call to  set additional times or  have any questions they can call RT [phone]  Spoke with Nathaniel Hicks again today and she will be able to meet again on Tuesday 11/04/2021 at 2:30 pm to 4:30 pm for her second hands on session.

## 2021-10-30 NOTE — Progress Notes (Addendum)
Physical Therapy Session Note  Patient Details  Name: Nathaniel Hicks MRN: [id number] Date of Birth: 06/21/1940  Today's Date: 10/30/2021 PT Individual Time: 8502-7741 and 1420-1535 PT Individual Time Calculation (min): 60 min and 75 min  Short Term Goals: Week 1:  PT Short Term Goal 1 (Week 1): STG=LTG due to ELOS.  Skilled Therapeutic Interventions/Progress Updates:     Session 1: Patient in recliner in the room upon PT arrival. Patient alert and agreeable to PT session. Patient reported 2-3/10 back pain (reports baseline) during session, declined need for pain medication. PT provided repositioning, rest breaks, and distraction as pain interventions throughout session.   PMSV in place prior to and throughout session, patient on RA throughout session. SPO2: >96%, HR 80-110 bpm with activity.  Therapeutic Activity: Bed Mobility: Patient performed sit to supine with mid I in a flat bed without use of bed rails.  Transfers: Patient performed sit to/from stand x6 with supervision using RW x1, no AD x3, and SPC x2. Provided verbal cues for safe use of ADs, reaching back to sit, and forward weight shift without AD.  Gait Training:  Patient ambulated >100 feet with RW with supervision, 307 feet x2 without and AD with CGA, and >100 feet x2 using SPC with CGA-close supervision. Ambulated with decreased gait speed, decreased step length and height, increased BOS, increased lateral sway without AD and with SPC, forward trunk lean, and downward head gaze. Provided verbal cues for erect posture for improved visual scanning and breath support, increase step height, reduced BOS to shoulder width apart, and increased lateral hip activation in stance to reduce trunk sway. Patient reports use of SPC with R hand at baseline, has R knee OA and in right handed, recommended use of SPC on L for increased R lower extremity support during gait and increased R hand availability for secondary tasks with dominant hand.  Patient receptive to education, but increased challenge with coordination of device on L. Will continue to practice with SPC on L for reduced fall risk at home.   Patient ascended/descended 12x6" steps using B rails with CGA. Performed reciprocal gait pattern throughout. Provided cues for technique and sequencing. Noted reduced SOB and increased speed with activity compared to yesterday.  Neuromuscular Re-ed: Patient performed the following lower extremity motor control activities: -sit to stand x10 B hands on thighs CGA progressing to supervision -sit to stand 2x10 without upper extremity support CGA progressing to supervision -standing balance with multidirectional reaching task with supervision without LOB x2 min  Patient in bed at end of session with breaks locked, bed alarm set, and all needs within reach.   Session 2: Patient in recliner with his sister, Nathaniel Hicks, in the room upon PT arrival. Patient alert and agreeable to PT session. Patient reported 4/10 L jaw pain during session, patient declined pain medication, RN made aware. PT provided repositioning, rest breaks, and distraction as pain interventions throughout session.   PMSV in place prior to and throughout session, patient on RA throughout session. SPO2: >96%, HR 80-110 bpm with activity.  Patient's sister expressed that the patient needed increased time in CIR due to arrangements needing to be made for safe d/c home. Spent increased time discussing equipment needs, ELOS, home modifications to reduce fall risk, and reinforcing understanding that the patient would only have intermittent assist at d/c. Patient upset during discussion. Educated on challenges between changes in family dynamics with need of a caregiver and patient's frustrations with loss of independence and stress from hospital  stay. Provided appropriate coping strategies throughout session.   Therapeutic Activity: Bed Mobility: Patient performed sit to supine with  mid I in a flat bed without use of bed rails.  Transfers: Patient performed sit to/from stand x4 with supervision using SPC x3 and no AD x1. Provided verbal cues for safe use of ADs, reaching back to sit, and forward weight shift without AD.  Gait Training:  Patient ambulated >100 feet x2 and >50 feet x1 using SPC on L, good carry-over of purpose and technique from previous session, with CGA-close supervision. Gait deviations and cuing as above.  Patient ambulated 20 feet forwards and backwards x2 and side-stepping R/L x2 focused on balance and increased step height and length in all directions without AD with CGA-close supervision.  Therapeutic Exercise: Patient performed the following exercises with verbal and tactile cues for proper technique. -NuStep x10 min Level 5 with >40 SPM using B upper and lower extremities, focused on cardiovascular endurance with cues for paced breathing and maintaining SPM throughout; reduced work load to level 4 at 8 min due to fatigue and mild SOB, SPO2 100% HR 116 bpm  Patient in bed with his sister leaving the room at end of session with breaks locked, bed alarm set, and all needs within reach.   Therapy Documentation Precautions:  Precautions Precautions: Fall Precaution Comments: trach Other Brace: PMV all waking hours Restrictions Weight Bearing Restrictions: No    Therapy/Group: Individual Therapy  Nathaniel Hicks PT, DPT, NCS, CBIS  10/30/2021, 4:36 PM

## 2021-10-30 NOTE — Care Management (Signed)
[facility] Individual Statement of Services  Patient Name:  Nathaniel Hicks  Date:  10/30/2021  Welcome to the [facility].  Our goal is to provide you with an individualized program based on your diagnosis and situation, designed to meet your specific needs.  With this comprehensive rehabilitation program, you will be expected to participate in at least 3 hours of rehabilitation therapies Monday-Friday, with modified therapy programming on the weekends.  Your rehabilitation program will include the following services:  Physical Therapy (PT), Occupational Therapy (OT), Speech Therapy (ST), 24 hour per day rehabilitation nursing, Therapeutic Recreaction (TR), Psychology, Neuropsychology, Care Coordinator, Rehabilitation Medicine, [place], and Other  Weekly team conferences will be held on Tuesdays to discuss your progress.  Your Inpatient Rehabilitation Care Coordinator will talk with you frequently to get your input and to update you on team discussions.  Team conferences with you and your family in attendance may also be held.  Expected length of stay: 7 days    Overall anticipated outcome: Independent with Assistive Device  Depending on your progress and recovery, your program may change. Your Inpatient Rehabilitation Care Coordinator will coordinate services and will keep you informed of any changes. Your Inpatient Rehabilitation Care Coordinator's name and contact numbers are listed  below.  The following services may also be recommended but are not provided by the [facility] will be made to provide these services after discharge if needed.  Arrangements include referral to agencies that provide these services.  Your insurance has been verified to be:  [employer] [facility]  Medicare  Your primary doctor is:  Allyn Kenner  Pertinent information will be shared with your doctor and your insurance company.  Inpatient Rehabilitation Care Coordinator:  Cathleen Corti [phone] or [phone]  Information discussed with and copy given to patient by: Rana Snare, 10/30/2021, 6:19 AM

## 2021-10-30 NOTE — Progress Notes (Signed)
Speech Language Pathology Daily Session Note  Patient Details  Name: Nathaniel Hicks MRN: [id number] Date of Birth: 1940/09/13  Today's Date: 10/30/2021 SLP Individual Time: 0725-0805 SLP Individual Time Calculation (min): 40 min  Short Term Goals: Week 1: SLP Short Term Goal 1 (Week 1): STGs=LTGs due to ELOS  Skilled Therapeutic Interventions: Skilled treatment session focused on dysphagia and communication goals. Patient sat EOB and PMSV was donned. Patient consumed his breakfast meal of Dys. 1 textures with thin liquids without overt s/s of aspiration with intermittent verbal cues needed for a slow rate. Recommend patient continue current diet. SLP also provided supervision level verbal cues for use of speech intelligibility strategies at the sentence level to achieve ~90% intelligibility during an informal conversation. Patient left upright in bed with alarm on and all needs within reach. Continue with current plan of care.      Pain No/Denies Pain   Therapy/Group: Individual Therapy  Marita Burnsed 10/30/2021, 3:46 PM

## 2021-10-30 NOTE — Progress Notes (Signed)
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   Vitamin D deficiency 11/02/2017   Past Surgical History:  Past Surgical History:  Procedure Laterality Date   COLONOSCOPY N/A 11/24/2012   Procedure: COLONOSCOPY;  Surgeon: Rogene Houston, MD;  [facility];  Service: Endoscopy;  Laterality: N/A;  830-moved to [place] notified pt   FLOOR OF MOUTH BIOPSY N/A 10/14/2021   Procedure: FLOOR OF MOUTH RESECTION;  Surgeon: Melida Quitter, MD;  Location: [facility];  Service: ENT;  Laterality: N/A;   HEMORROIDECTOMY     KNEE ARTHROSCOPY WITH LATERAL MENISECTOMY Right 08/31/2017   Procedure: KNEE ARTHROSCOPY WITH LATERAL MENISECTOMY;  Surgeon: Carole Civil, MD;  Location: [facility];  Service: Orthopedics;  Laterality: Right;   LESION EXCISION N/A 03/23/2012   Procedure: EXCISION NEOPLASM SCALP ;  Surgeon: Jamesetta So, MD;  Location: [facility];  Service: General;  Laterality: N/A;  Excision of Scalp Neoplasm   ORCHIECTOMY Right 12/20/2012   Procedure: RIGHT RADICAL ORCHIECTOMY/POSSIBLE BX RIGHT TESTICLE;  Surgeon: Marissa Nestle, MD;  Location: [facility];  Service: Urology;  Laterality: Right;   PROSTATE SURGERY     RADICAL NECK DISSECTION Bilateral 10/14/2021   Procedure: NECK DISSECTION;  Surgeon: Melida Quitter, MD;  Location: [facility];  Service: ENT;  Laterality: Bilateral;   SCALP LACERATION REPAIR     [facility]-Dr Tamala Julian   SKIN FULL THICKNESS GRAFT Bilateral 10/14/2021   Procedure: PLATYSMA FLAP CLOSURE;  Surgeon: Melida Quitter, MD;  Location: [facility];   Service: ENT;  Laterality: Bilateral;   TOOTH EXTRACTION  10/14/2021   Procedure: DENTAL EXTRACTIONS;  Surgeon: Melida Quitter, MD;  Location: [facility];  Service: ENT;;   TRACHEOSTOMY TUBE PLACEMENT N/A 10/14/2021   Procedure: TRACHEOSTOMY;  Surgeon: Melida Quitter, MD;  Location: [facility];  Service: ENT;  Laterality: N/A;   Social History:  reports that he has quit smoking. His smoking use included cigarettes. He has a 12.50 pack-year smoking history. He has never used smokeless tobacco. He reports that he does not currently use alcohol. He reports that he does not use drugs.  Family / Support Systems Marital Status: Single Patient Roles: Parent Spouse/Significant Other: N/A Children: Pt has one adult son but does not speak with him often. States he last heard he lived in [place]. Other Supports: sister Anticipated Caregiver: Per pt his sister can help him with 24/7 care. Ability/Limitations of Caregiver: None reported Caregiver Availability: 24/7 Family Dynamics: Pt lives alone.  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, manufacturing until retirement Education: [employer]th [employer] - How often do you need to have someone help you when you read instructions, pamphlets, or other written material from your doctor or pharmacy?: Never Writes: Yes Employment Status: Retired Age Retired: 52 Public relations account executive Issues: Pt reports an insirance in the 70s and no other occurrences since Guardian/Conservator: N/A   Abuse/Neglect Abuse/Neglect Assessment Can Be Completed: Yes Physical Abuse: Denies Verbal Abuse: Denies Sexual Abuse:  Denies Exploitation of patient/patient's resources: Denies Self-Neglect: Denies  Patient response to: Social Isolation - How often do you feel lonely or isolated from those around you?: Never  Emotional Status Pt's affect, behavior and adjustment status: Pt in good  spirits at time of visit Recent Psychosocial Issues: Denies Psychiatric History: Denies Substance Abuse History: Pt admis that he quit smoking cigarettes 7-8 months ago. Reports an occassional drink. Denies rec drug use.  Patient / Family Perceptions, Expectations & Goals Pt/Family understanding of illness & functional limitations: Pt has a general understanding of his care needs Premorbid pt/family roles/activities: Independent Anticipated changes in roles/activities/participation: some assistnce with ADLs/IADLs Pt/family expectations/goals: Pt goal is to "help me get strong."  [employer]: None Premorbid Home Care/DME Agencies: None Transportation available at discharge: TBD Is the patient able to respond to transportation needs?: Yes In the past 12 months, has lack of transportation kept you from medical appointments or from getting medications?: No In the past 12 months, has lack of transportation kept you from meetings, work, or from getting things needed for daily living?: No Resource referrals recommended: Neuropsychology  Discharge Planning Living Arrangements: [place]: Other relatives Type of Residence: Private residence Insurance Resources: Multimedia programmer (specify) ([employer] Medicare) Financial Resources: Radio broadcast assistant Screen Referred: No Living Expenses: Rent Money Management: Patient Does the patient have any problems obtaining your medications?: No Home Management: Pt managed all homecare needs Patient/Family Preliminary Plans: TBD Care Coordinator Barriers to Discharge: Decreased caregiver support, Lack of/limited family support, Estate agent Anticipated Follow Up Needs: HH/OP  Clinical Impression SW met with pt in room while he was eating lunch. SW introduced self, explained role, and discussed discharge process. Pt is not a English as a second language teacher. No HCPOA. DME: cane, w/c and RW. Pt reports the trach is new and no  oxygen at home. SW will confirm all d/c needs. Pt is aware SW will follow-up with his sister [name].    Davionna Blacksher A Clare Casto 10/30/2021, 6:58 AM

## 2021-10-30 NOTE — Progress Notes (Signed)
Occupational Therapy Session Note  Patient Details  Name: CASMIR AUGUSTE MRN: [id number] Date of Birth: January 30, 1940  Today's Date: 10/30/2021 OT Individual Time: 0815-0900 OT Individual Time Calculation (min): 45 min    Short Term Goals: Week 1:  OT Short Term Goal 1 (Week 1): STGs = LTGs  Skilled Therapeutic Interventions/Progress Updates:     Pt received in bed with no pain ADL: Pt completes ADL at overall CGA-MIN A  Level. Skilled interventions include: cuing for RW management for bathroom transfers, use of trach collar to cover for shower, cuing to improve posterior lean in standing in shower, edu re shower chair/DC set up, and installation/use of elastic shoe laces. Dressing at EOB, bathing at shower and transfer to recliner at end of session.  Pt left at end of session in bed with exit alarm on, call light in reach and all needs met   Therapy Documentation Precautions:  Precautions Precautions: Fall Precaution Comments: trach Other Brace: PMV all waking hours Restrictions Weight Bearing Restrictions: No   Therapy/Group: Individual Therapy  Tonny Branch 10/30/2021, 6:49 AM

## 2021-10-30 NOTE — Progress Notes (Signed)
PROGRESS NOTE   Subjective/Complaints: No new issues today. Anxious to get home. Up with OT in shower when I came in room today.   ROS: Patient denies fever, rash, sore throat, blurred vision, dizziness, nausea, vomiting, diarrhea, shortness of breath or chest pain, joint or back/neck pain, headache, or mood change.    Objective:   No results found. Recent Labs    10/29/21 0725  WBC 8.2  HGB 10.6*  HCT 32.7*  PLT 352   Recent Labs    10/29/21 0725  NA 135  K 4.1  CL 104  CO2 22  GLUCOSE 96  BUN 18  CREATININE 0.72  CALCIUM 10.0    Intake/Output Summary (Last 24 hours) at 10/30/2021 0846 Last data filed at 10/30/2021 0700 Gross per 24 hour  Intake 598 ml  Output 1900 ml  Net -1302 ml        Physical Exam: Vital Signs Blood pressure (!) 126/53, pulse 86, temperature 97.9 F (36.6 C), temperature source Oral, resp. rate 18, height '5\' 11"'$  (1.803 m), weight 76.4 kg, SpO2 98 %.  Constitutional: No distress . Vital signs reviewed. HEENT: NCAT, EOMI, oral membranes moist Neck: #6 trach. Speaks around it, hoarse voice Cardiovascular: RRR without murmur. No JVD    Respiratory/Chest: CTA Bilaterally without wheezes or rales. Normal effort    GI/Abdomen: BS +, non-tender, non-distended Ext: no clubbing, cyanosis, or edema Psych: pleasant and cooperative  Skin: surgical wounds cdi. Old wounds on legs.  Neuro:  Alert and oriented x 3. Normal insight and awareness. Intact Memory. Normal language. Cranial nerve exam unremarkable, speech dysartrhic. UE motor 4-5/5. LUE 4+/5 prox to distal. No focal sensory findings. No abnl tone.  Musculoskeletal: good posture, no pain with rom.     Assessment/Plan: 1. Functional deficits which require 3+ hours per day of interdisciplinary therapy in a comprehensive inpatient rehab setting. Physiatrist is providing close team supervision and 24 hour management of active medical  problems listed below. Physiatrist and rehab team continue to assess barriers to discharge/monitor patient progress toward functional and medical goals  Care Tool:  Bathing    Body parts bathed by patient: Right arm, Left arm, Chest, Abdomen, Front perineal area, Buttocks, Right upper leg, Face, Left lower leg, Right lower leg, Left upper leg         Bathing assist Assist Level: Contact Guard/Touching assist     Upper Body Dressing/Undressing Upper body dressing   What is the patient wearing?: Pull over shirt, Button up shirt    Upper body assist Assist Level: Set up assist    Lower Body Dressing/Undressing Lower body dressing      What is the patient wearing?: Incontinence brief, Pants     Lower body assist Assist for lower body dressing: Minimal Assistance - Patient > 75%     Toileting Toileting    Toileting assist Assist for toileting: Minimal Assistance - Patient > 75%     Transfers Chair/bed transfer  Transfers assist     Chair/bed transfer assist level: Minimal Assistance - Patient > 75%     Locomotion Ambulation   Ambulation assist      Assist level: Minimal Assistance - Patient >  75% Assistive device: No Device Max distance: 160 ft   Walk 10 feet activity   Assist     Assist level: Minimal Assistance - Patient > 75% Assistive device: No Device   Walk 50 feet activity   Assist    Assist level: Minimal Assistance - Patient > 75% Assistive device: No Device    Walk 150 feet activity   Assist    Assist level: Minimal Assistance - Patient > 75% Assistive device: No Device    Walk 10 feet on uneven surface  activity   Assist     Assist level: Minimal Assistance - Patient > 75%     Wheelchair     Assist Is the patient using a wheelchair?: Yes Type of Wheelchair: Manual    Wheelchair assist level: Supervision/Verbal cueing Max wheelchair distance: >150 ft    Wheelchair 50 feet with 2 turns  activity    Assist        Assist Level: Supervision/Verbal cueing   Wheelchair 150 feet activity     Assist      Assist Level: Supervision/Verbal cueing   Blood pressure (!) 126/53, pulse 86, temperature 97.9 F (36.6 C), temperature source Oral, resp. rate 18, height '5\' 11"'$  (1.803 m), weight 76.4 kg, SpO2 98 %.  Medical Problem List and Plan: 1. Functional deficits secondary to debility s/p radical neck dissection for oral CA             -patient may shower             -ELOS/Goals: 10-14 days             -Continue CIR therapies including PT, OT, and SLP              2.  Antithrombotics: -DVT/anticoagulation:  SCD             -antiplatelet therapy: N/A 3. Pain Management:  Hydrocodone prn.  4. Mood/Behavior/Sleep: LCSW to follow for evaluation and support.              -antipsychotic agents: N/a 5. Neuropsych/cognition: This patient is capable of making decisions on his own behalf. 6. Skin/Wound Care: Routine pressure relief measures.             -- Per ENT, The flap under the tongue is evolving with some necrosis of the skin that may later require debridement.            -- No special wound care instructions at present   -pt is tolerating current diet without pain-  7. Fluids/Electrolytes/Nutrition: Monitor I/O. Continue            -- Dysphagia 1 diet with thins, tolerating   -added protein supp for low albumin  -pt eating 50-100% of meals  8. HTN: Monitor BP TID. Continue hydralazine and coreg.  9. ABLA: stable 12-11. Admission labs.  10. Leucocytosis: WBC trending down---8.2 on 10/18             --no fevers or other signs of infection.              --encourage pulmonary hygiene             -- s/p 7 days Unasyn 11. Radical neck dissection with trach: Lives alone in an apartment. --Ordered trach team for education on trach care.  -- Friends to help out after d/c?  -- Tolerating Shiley #6 with PMV. Likely will go home with trach. F/u requested in 2 weeks from  10/13 with Dr.  Bates    LOS: 2 days A FACE TO FACE EVALUATION WAS PERFORMED  Meredith Staggers 10/30/2021, 8:46 AM

## 2021-10-31 NOTE — Progress Notes (Signed)
Physical Therapy Session Note  Patient Details  Name: Nathaniel Hicks MRN: [id number] Date of Birth: 03-15-1940  Today's Date: 10/31/2021 PT Individual Time: 0916-1009 PT Individual Time Calculation (min): 53 min   Short Term Goals: Week 1:  PT Short Term Goal 1 (Week 1): STG=LTG due to ELOS.  Skilled Therapeutic Interventions/Progress Updates:   Pt received sitting in recliner and agreeable to PT. Pt performed stand pivot transfer with supervision assist and cues from PT for posture. Pt transported to day room in [facility]. Stand pivot transfer to and from nustep withCGA for safety. Reciprocal movement BUE/BLE with cues for pursed lip breathing. SpO2 assessed at 5 min and 8 min at 99-100%. Therapeutic rest break between bouts. Pt transported to rehab gym. Stair management training x 12 with BUE support on rails with cues for UE placement. Dynamic gait training with SPC to step up/down 4 inch step and weave through 6 cones x 2. CGA for step up/down with SPC.  Reports need for BM. Stand pivot transfer toilet with no Ad and supervision assist. Pt able to perform all clothing management and pericare with cues for safety. Patient returned to room and performed stand pivot to recliner with no Ad and supervision assist. Pt left sitting in recliner with call bell in reach and all needs met.           Therapy Documentation Precautions:  Precautions Precautions: Fall Precaution Comments: trach Other Brace: PMV all waking hours Restrictions Weight Bearing Restrictions: No  Vital Signs: Therapy Vitals Pulse Rate: 81 Resp: 18 Patient Position (if appropriate): Sitting Oxygen Therapy SpO2: 97 % O2 Device: Room Air Pain: Pain Assessment Pain Scale: 0-10 Pain Score: 0-No pain    Therapy/Group: Individual Therapy  Lorie Phenix 10/31/2021, 10:39 AM

## 2021-10-31 NOTE — Progress Notes (Signed)
Speech Language Pathology Daily Session Note  Patient Details  Name: Nathaniel Hicks MRN: [id number] Date of Birth: 04/29/1940  Today's Date: 10/31/2021 SLP Individual Time: 0700-0742 SLP Individual Time Calculation (min): 42 min  Short Term Goals: Week 1: SLP Short Term Goal 1 (Week 1): STGs=LTGs due to ELOS  Skilled Therapeutic Interventions: Skilled treatment session focused on communication and swallowing goals. Upon arrival, patient was awake with PMSV in place. Patient reports he had slept all night while wearing it. SLP re-educated patient on the importance of wearing the PMSV during all waking hours and removal with sleep. Patient verbalized understanding but will need reinforcement. SLP utilized a Comptroller for Warden/ranger and provided Max verbal and visual cues for how to appropriately donn/doff his PMSV. Patient able to demonstrate intermittently with overall ongoing Mod verbal cues needed. Patient requires ongoing practice as there is potential for patient to accidentally dislodge his trach while donning/donning PMSV. Patient consumed his breakfast meal of Dys. 1 textures with thin liquids with Min verbal cues needed for a slow rate and to clear oral residue. Patient with one coughing episode with grits, suspect due to increased texture with patient requiring Mod verbal cues to discontinue eating until coughing subsides resulting in verbal frustration from patient. Recommend patient continue current diet with intermittent supervision. Patient left upright in bed with alarm on and all needs within reach. Continue with current plan of care.      Pain Pain Assessment Pain Scale: 0-10 Pain Score: 0-No pain  Therapy/Group: Individual Therapy  Darsi Tien 10/31/2021, 11:12 AM

## 2021-10-31 NOTE — Progress Notes (Signed)
Inpatient Rehabilitation Admission Medication Review by a Pharmacist  A complete drug regimen review was completed for this patient to identify any potential clinically significant medication issues.  High Risk Drug Classes Is patient taking? Indication by Medication  Antipsychotic Yes Compazine PO/IM - N/V  Anticoagulant Yes Lovenox - DVT px  Antibiotic No   Opioid Yes Vicodin - pain  Antiplatelet No   Hypoglycemics/insulin No   Vasoactive Medication Yes Amlodipine/coreg/hydralazine - HTN  Chemotherapy No   Other Yes Protonix - GERD Trazodone - sleep     Type of Medication Issue Identified Description of Issue Recommendation(s)  Drug Interaction(s) (clinically significant)     Duplicate Therapy     Allergy     No Medication Administration End Date     Incorrect Dose     Additional Drug Therapy Needed  Pravachol, vitamin D, B12, voltaren, magox, gabapentin, benazepril, ketoprofen eye drops, rapaflo Resume as needed  Significant med changes from prior encounter (inform family/care partners about these prior to discharge).    Other       Clinically significant medication issues were identified that warrant physician communication and completion of prescribed/recommended actions by midnight of the next day:  [facility]  Name of provider notified for urgent issues identified: Algis Liming, PA  Provider Method of Notification: Secure chat    Pharmacist comments:   Time spent performing this drug regimen review (minutes):  [place], PharmD, [place], AAHIVP, CPP Infectious Disease Pharmacist 10/31/2021 11:38 AM

## 2021-10-31 NOTE — Progress Notes (Signed)
Trach education done with patient's sister Ms. Hall. She has a trach education booklet which she has been reviewing. She was able to do return demonstration for trach cleaning and suctioning. Respiratory will follow-up next week for any questions that the family might have for trach care. Family is very supportive of patient care and willing to learn.

## 2021-10-31 NOTE — Progress Notes (Signed)
PROGRESS NOTE   Subjective/Complaints: Pt up with PT in gym. Feeling well. Asked why he can't feel his mouth (at least partially)  ROS: Patient denies fever, rash, sore throat, blurred vision, dizziness, nausea, vomiting, diarrhea, cough, shortness of breath or chest pain, joint or back/neck pain, headache, or mood change.    Objective:   No results found. Recent Labs    10/29/21 0725  WBC 8.2  HGB 10.6*  HCT 32.7*  PLT 352   Recent Labs    10/29/21 0725  NA 135  K 4.1  CL 104  CO2 22  GLUCOSE 96  BUN 18  CREATININE 0.72  CALCIUM 10.0    Intake/Output Summary (Last 24 hours) at 10/31/2021 0841 Last data filed at 10/31/2021 0700 Gross per 24 hour  Intake 886 ml  Output 600 ml  Net 286 ml        Physical Exam: Vital Signs Blood pressure 116/63, pulse 86, temperature 98.1 F (36.7 C), resp. rate 18, height '5\' 11"'$  (1.803 m), weight 76.4 kg, SpO2 100 %.  Constitutional: No distress . Vital signs reviewed. HEENT: NCAT, EOMI, oral membranes moist Neck: #6 trach with PMV--phonates well Cardiovascular: RRR without murmur. No JVD    Respiratory/Chest: CTA Bilaterally without wheezes or rales. Normal effort. A few scattered upper airway sounds    GI/Abdomen: BS +, non-tender, non-distended Ext: no clubbing, cyanosis, or edema Psych: pleasant and cooperative  Skin: surgical wounds cdi. Old wounds on legs.  Neuro:  Alert and oriented x 3. Normal insight and awareness. Intact Memory. Normal language. Cranial nerve exam unremarkable, speech dysartrhic. UE motor 4-5/5. LUE 4+/5 prox to distal. No focal sensory findings. No abnl tone. Good sitting balance Musculoskeletal: good posture, no pain with rom.     Assessment/Plan: 1. Functional deficits which require 3+ hours per day of interdisciplinary therapy in a comprehensive inpatient rehab setting. Physiatrist is providing close team supervision and 24 hour  management of active medical problems listed below. Physiatrist and rehab team continue to assess barriers to discharge/monitor patient progress toward functional and medical goals  Care Tool:  Bathing    Body parts bathed by patient: Right arm, Left arm, Chest, Abdomen, Front perineal area, Buttocks, Right upper leg, Face, Left lower leg, Right lower leg, Left upper leg         Bathing assist Assist Level: Contact Guard/Touching assist     Upper Body Dressing/Undressing Upper body dressing   What is the patient wearing?: Pull over shirt, Button up shirt    Upper body assist Assist Level: Set up assist    Lower Body Dressing/Undressing Lower body dressing      What is the patient wearing?: Incontinence brief, Pants     Lower body assist Assist for lower body dressing: Minimal Assistance - Patient > 75%     Toileting Toileting    Toileting assist Assist for toileting: Minimal Assistance - Patient > 75%     Transfers Chair/bed transfer  Transfers assist     Chair/bed transfer assist level: Minimal Assistance - Patient > 75%     Locomotion Ambulation   Ambulation assist      Assist level: Minimal Assistance -  Patient > 75% Assistive device: No Device Max distance: 160 ft   Walk 10 feet activity   Assist     Assist level: Minimal Assistance - Patient > 75% Assistive device: No Device   Walk 50 feet activity   Assist    Assist level: Minimal Assistance - Patient > 75% Assistive device: No Device    Walk 150 feet activity   Assist    Assist level: Minimal Assistance - Patient > 75% Assistive device: No Device    Walk 10 feet on uneven surface  activity   Assist     Assist level: Minimal Assistance - Patient > 75%     Wheelchair     Assist Is the patient using a wheelchair?: Yes Type of Wheelchair: Manual    Wheelchair assist level: Supervision/Verbal cueing Max wheelchair distance: >150 ft    Wheelchair 50 feet with  2 turns activity    Assist        Assist Level: Supervision/Verbal cueing   Wheelchair 150 feet activity     Assist      Assist Level: Supervision/Verbal cueing   Blood pressure 116/63, pulse 86, temperature 98.1 F (36.7 C), resp. rate 18, height '5\' 11"'$  (1.803 m), weight 76.4 kg, SpO2 100 %.  Medical Problem List and Plan: 1. 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  Antithrombotics: -DVT/anticoagulation:  SCD             -antiplatelet therapy: N/A 3. Pain Management:  Hydrocodone prn.  4. Mood/Behavior/Sleep: LCSW to follow for evaluation and support.              -antipsychotic agents: N/a 5. Neuropsych/cognition: This patient is capable of making decisions on his own behalf. 6. Skin/Wound Care: Routine pressure relief measures.             -- Per ENT, The flap under the tongue is evolving with some necrosis of the skin that may later require debridement.            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 He'll have to wait and see what type of recovery he'll have. Deferred to ENT for further discussion 7. Fluids/Electrolytes/Nutrition: Monitor I/O. Continue            -- Dysphagia 1 diet with thins, tolerating   -added protein supp for low albumin  -pt eating 50-100% of meals  8. HTN: Monitor BP TID. Continue hydralazine and coreg.  9. ABLA: stable 12-11. Admission labs.  10. Leucocytosis: WBC trending down---8.2 on 10/18             --no fevers or other signs of infection.              --encourage pulmonary hygiene             -- s/p 7 days Unasyn 11. Radical neck dissection with trach: Lives alone in an  apartment. --Ordered trach team for  education on trach care.  -- Friends to help out after d/c?  -- Tolerating Shiley #6 with PMV. Given short los, he will go home with trach and f/u with ENT ~ 11/07/21    LOS: 3 days A FACE TO [place] 10/31/2021, 8:41 AM

## 2021-10-31 NOTE — Progress Notes (Signed)
Occupational Therapy Session Note  Patient Details  Name: Nathaniel Hicks MRN: [id number] Date of Birth: 12/28/1940  Today's Date: 10/31/2021 OT Individual Time: 0752-0850 OT Individual Time Calculation (min): 58 min    Short Term Goals: Week 1:  OT Short Term Goal 1 (Week 1): STGs = LTGs  Skilled Therapeutic Interventions/Progress Updates: Patient received sitting on the EOB with nursing. Patient agreeable to OT intervention and assist with am ADL tasks. Patient able to get OOB and transfer to w/c for bathing at the sink with CGA/SBA. Once seated at the sink the patient only needed set up assist to gather items for grooming and bathing. Able to stand from locked w/c with supervision to stand to finish washing at the sink. Patient given clothing to dress and was able to manage buttons, zipper and belt without difficulty. Good participation with all self care skills.      Therapy Documentation Precautions:  Precautions Precautions: Fall Precaution Comments: trach Other Brace: PMV all waking hours Restrictions Weight Bearing Restrictions: No General:   Vital Signs: Therapy Vitals Pulse Rate: 74 Resp: 18 Patient Position (if appropriate): Sitting Oxygen Therapy SpO2: 98 % O2 Device: Room Air Pain: Pain Assessment Pain Scale: 0-10 Pain Score: 0-No pain ADL: ADL Eating: Set up Grooming: Supervision/safety Upper Body Bathing: Supervision/safety Where Assessed-Upper Body Bathing: Sitting at sink Lower Body Bathing: set up assist Where Assessed-Lower Body Bathing: Sitting at sink Upper Body Dressing: Setup Where Assessed-Upper Body Dressing: Sitting at sink Lower Body Dressing: Supervision/ set up  Where Assessed-Lower Body Dressing: Sitting at sink, Standing at sink Toileting: Supervision/ SBA Where Assessed-Toileting: Glass blower/designer: Close Contractor Method: Arts development officer: Raised toilet seat    Balance Good balance  with dynamic standing ADL's   Exercises: Ambulated to gym using cane SBA. Performed strength and eudurance building exercises with 3 lb dowel. 2 x 10 in varied planes. Good tolerance of UE exercises. Ambulated back to his room with close SBA.       Therapy/Group: Individual Therapy  Hermina Barters 10/31/2021, 12:24 PM

## 2021-10-31 NOTE — Progress Notes (Signed)
Occupational Therapy Session Note  Patient Details  Name: Nathaniel Hicks MRN: [id number] Date of Birth: 07-18-1940  Today's Date: 10/31/2021 OT Individual Time: 1015-1110 OT Individual Time Calculation (min): 55 min    Short Term Goals: Week 1:  OT Short Term Goal 1 (Week 1): STGs = LTGs  Skilled Therapeutic Interventions/Progress Updates:    Pt received in recliner with "some" back pain but declines intervention. Rest and repositioning provided.  Therapeutic activity Focus of session on IADLs: laundry, kitchen mobility, and discussing energy conservation  Pt completes kitchen search into various height cabinets/appliaces in prep for IADL retraining from ambulatory with SPC level with min cuing for safety/positioning throughout environment. Pt uses  bowl for transportation of items with education on energy conservation techniques throughout activity.   Pt completes laundry activity from ambulatory level with edu re AE for energy conservation/decrease back pain. Pt completes at supervision level with VC/education on energy conservation and adaptive strategies with use of rolling cart to tranport laundry and seated folding.   [place] apartment transfers into tub shower and into couch with supervision and SPC.   Pt left at end of session in recliner with exit alarm on, call light in reach and all needs met   Therapy Documentation Precautions:  Precautions Precautions: Fall Precaution Comments: trach Other Brace: PMV all waking hours Restrictions Weight Bearing Restrictions: No General:      Therapy/Group: Individual Therapy  Tonny Branch 10/31/2021, 6:57 AM

## 2021-10-31 NOTE — Progress Notes (Addendum)
Patient ID: Nathaniel Hicks, male   DOB: December 08, 1940, 81 y.o.   MRN: [id number]  SW returned phone call to patient sister [name] [phone]) who had questions about putting appropriate resources in place to ensure pt has support at discharge. States she can provide PRN support, however, would like for pt to have an aide. SW shared once informed on d/c recommendations, SW will follow-up to provide updates and will work on putting necessary supports in place as able too. Fam edu on Tuesday (10/24) 10:30am-12pm and also coming in that morning to meet with respiratory for more trach care edu.   SW explore private duty nursing for pt as an option given pt has a trach. SW spoke with Matt/Intellichoice liaison ([phone]) to discuss referral. States someone will have to be with patient at all times when nursing is not in the home as nursing will have to give a report. SW informed will send referral if able to confirm 24/7 care at discharge, as of now intermittent support is reported.   [id number] left message for pt sister [name] to inform on above conversation with her sister [name] about family edu, and asked for follow-up to get an idea of how much support she can provide to her brother at discharge. SW waiting on follow-up.   Loralee Pacas, MSW, [place] Office: [phone] Cell: [phone] Fax: [phone]

## 2021-11-01 NOTE — Progress Notes (Signed)
Speech Language Pathology Daily Session Note  Patient Details  Name: Nathaniel Hicks MRN: [id number] Date of Birth: 22-Jan-1940  Today's Date: 11/01/2021 SLP Individual Time: 1015-1100 SLP Individual Time Calculation (min): 45 min  Short Term Goals: Week 1: SLP Short Term Goal 1 (Week 1): STGs=LTGs due to ELOS  Skilled Therapeutic Interventions: S: Pt seen this date for skilled ST intervention targeting swallowing and communication goals outlined in care plan. Pt received awake/alert and OOB in recliner chair. Agreeable to ST intervention in hospital room, though verbalized frustration with being in the hospital. Participatory with encouragement. PMSV donned at the onset of today's session.  O: SLP facilitated today's session by providing: - Video and verbal education re: donning and doffing PMSV; reinforcing use of non-dominant hand for anchoring trach and dominant hand to donn and doff valve with loose 1/4 turn. - Mass practice, mirror, and Mod-Max A verbal and tactile cues, faded to Min A for pt to demonstrate understanding of donning and doffing PMSV.  - Sup A to Mod A verbal cues to recall 2 out of 2 donning recommendations (during waking hours and during all PO intake, and  Sup A to Min-Mod A verbal cues for doffing recommendations (while sleeping and if vitals are not WNL). - Sup A questions prompts to recall need for clean hands prior to donning and doffing PMSV, as well as PMSV cleaning instructions (wash with warm, soapy water (dish detergent) and allowing to air dry). - Skilled observation of intake of thin liquid via cup - no clinical s/sx concerning for airway compromise noted with pt implementing small and single sips independently. Per chart review, vital signs and medical status stable.  A: Pt appears sitmulable for skilled ST intervention as evident by ability to donn and doff PMSV with assistance, and recall of donning and doffing guidelines/recommendations. Recommend  continuation of current diet consistencies and reinforcement and mass practice of donning and doffing speaking valve in upcoming sessions.  P: Pt left in room and OOB in recliner chair with all safety measures activated. Call bell reviewed and within reach and all immediate needs met. Continue per current ST POC next session.   Pain Pain Assessment Pain Scale: 0-10 Pain Score: 0-No pain  Therapy/Group: Individual Therapy  Queena Monrreal A Shakina Choy 11/01/2021, 11:01 AM

## 2021-11-01 NOTE — Progress Notes (Signed)
Physical Therapy Session Note  Patient Details  Name: Nathaniel Hicks MRN: [id number] Date of Birth: 02/05/1940  Today's Date: 11/01/2021 PT Individual Time: 1450-1532 PT Individual Time Calculation (min): 42 min   Short Term Goals: Week 1:  PT Short Term Goal 1 (Week 1): STG=LTG due to ELOS.   Skilled Therapeutic Interventions/Progress Updates:   Pt received supine in bed and agreeable to PT. Supine>sit transfer with supervision assist and cues for safety. Pt reports need to BM. Toilet transfer with no AD and CGA. Pt able to void. Hygiene performed by PT with wash cloth. Hand hygiene at sink with supervision assist.   Pt transported to entrance of [place]. Gait training on unlevel cement sidewalk 3 x 260f. As well as to weave through 10 poles. All performed with with supervision assist and SPC. No LOBnoted throughout gait training but min cues for step height on the RLE   Patient returned to room and performed stand pivot to recliner with supervision assist and no AD. Pt left sitting in recliner with call bell in reach and all needs met.        Therapy Documentation Precautions:  Precautions Precautions: Fall Precaution Comments: trach Other Brace: PMV all waking hours Restrictions Weight Bearing Restrictions: No    Vital Signs: Therapy Vitals Temp: 97.9 F (36.6 C) Temp Source: Oral Pulse Rate: 93 Resp: 19 BP: (!) 125/58 Patient Position (if appropriate): Lying Oxygen Therapy SpO2: 100 % O2 Device: Room Air Pain:  denies   Therapy/Group: Individual Therapy  ALorie Phenix10/21/2023, 5:59 PM

## 2021-11-01 NOTE — Progress Notes (Signed)
Physical Therapy Session Note  Patient Details  Name: Nathaniel Hicks MRN: [id number] Date of Birth: 11-09-1940  Today's Date: 11/01/2021 PT Individual Time: 1450-1532 PT Individual Time Calculation (min): 42 min   Short Term Goals: Week 1:  PT Short Term Goal 1 (Week 1): STG=LTG due to ELOS.  Skilled Therapeutic Interventions/Progress Updates: Pt presents semi-reclined in bed and agreeable to therapy.  Pt transfers sup to sit w/ mod I and then dons shoes w/ Figure-4 positioning  Pt amb to BR w/ CGA/close supervision and SPC.  Pt supervision for toileting and clothing management, nursing notified.  Pt amb multiple trials w/ SPC and close supervision, occasional CGA.  Pt amb w/ cane in L hand, but pt does not feel right w/ this.  Pt performed multiple turns w/ SPC for improved performance and safety.  Pt negotiated 12 steps ( 4 steps x 3) w/ B rails holding SPC.  Pt performed steps w/ reciprocal gait pattern although shown step-to pattern for energy conservation, and agreeable.  Pt returned to room and transferred sit to supine after doffing shoes.  Bed alarm on and all needs in reach.  Pt given ice water w/o straw per request.     Therapy Documentation Precautions:  Precautions Precautions: Fall Precaution Comments: trach Other Brace: PMV all waking hours Restrictions Weight Bearing Restrictions: No General:   Vital Signs: Therapy Vitals Temp: 97.9 F (36.6 C) Temp Source: Oral Pulse Rate: 93 Resp: 19 BP: (!) 125/58 Patient Position (if appropriate): Lying Oxygen Therapy SpO2: 100 % O2 Device: Room Air Pain:0/10       Therapy/Group: Individual Therapy  Nathaniel Hicks 11/01/2021, 3:57 PM

## 2021-11-01 NOTE — Progress Notes (Signed)
PROGRESS NOTE   Subjective/Complaints:   Pt sitting up in bedside chair-  Said doing "pretty good" LBM this AM.  No SOB/DOE.    ROS:  Pt denies SOB, abd pain, CP, N/V/C/D, and vision changes  Objective:   No results found. No results for input(s): "WBC", "HGB", "HCT", "PLT" in the last 72 hours.  No results for input(s): "NA", "K", "CL", "CO2", "GLUCOSE", "BUN", "CREATININE", "CALCIUM" in the last 72 hours.   Intake/Output Summary (Last 24 hours) at 11/01/2021 1023 Last data filed at 11/01/2021 0654 Gross per 24 hour  Intake 1304 ml  Output 1225 ml  Net 79 ml        Physical Exam: Vital Signs Blood pressure 124/67, pulse 89, temperature 98.7 F (37.1 C), temperature source Oral, resp. rate 18, height '5\' 11"'$  (1.803 m), weight 76.4 kg, SpO2 99 %.   General: awake, alert, appropriate, sitting up in bedside chair;  NAD HENT: conjugate gaze; oropharynx dry- Trach in place with PMV CV: regular rate; no JVD Pulmonary: CTA B/L; no W/R/R- good air movement- no O2 via trach or nose GI: soft, NT, ND, (+)BS- normoactive Psychiatric: appropriate- a little flat Neurological: Ox3  Ext: no clubbing, cyanosis, or edema Skin: surgical wounds cdi. Old wounds on legs.  Neuro:  Alert and oriented x 3. Normal insight and awareness. Intact Memory. Normal language. Cranial nerve exam unremarkable, speech dysartrhic. UE motor 4-5/5. LUE 4+/5 prox to distal. No focal sensory findings. No abnl tone. Good sitting balance Musculoskeletal: good posture, no pain with rom.     Assessment/Plan: 1. Functional deficits which require 3+ hours per day of interdisciplinary therapy in a comprehensive inpatient rehab setting. Physiatrist is providing close team supervision and 24 hour management of active medical problems listed below. Physiatrist and rehab team continue to assess barriers to discharge/monitor patient progress toward functional  and medical goals  Care Tool:  Bathing    Body parts bathed by patient: Right arm, Right upper leg, Left upper leg, Left arm, Chest, Right lower leg, Left lower leg, Front perineal area, Abdomen, Buttocks, Face         Bathing assist Assist Level: Supervision/Verbal cueing     Upper Body Dressing/Undressing Upper body dressing   What is the patient wearing?: Button up shirt    Upper body assist Assist Level: Set up assist    Lower Body Dressing/Undressing Lower body dressing      What is the patient wearing?: Pants, Underwear/pull up     Lower body assist Assist for lower body dressing: Set up assist     Toileting Toileting    Toileting assist Assist for toileting: Independent with assistive device Assistive Device Comment: urinal   Transfers Chair/bed transfer  Transfers assist     Chair/bed transfer assist level: Minimal Assistance - Patient > 75%     Locomotion Ambulation   Ambulation assist      Assist level: Minimal Assistance - Patient > 75% Assistive device: No Device Max distance: 160 ft   Walk 10 feet activity   Assist     Assist level: Minimal Assistance - Patient > 75% Assistive device: No Device   Walk 50 feet activity  Assist    Assist level: Minimal Assistance - Patient > 75% Assistive device: No Device    Walk 150 feet activity   Assist    Assist level: Minimal Assistance - Patient > 75% Assistive device: No Device    Walk 10 feet on uneven surface  activity   Assist     Assist level: Minimal Assistance - Patient > 75%     Wheelchair     Assist Is the patient using a wheelchair?: Yes Type of Wheelchair: Manual    Wheelchair assist level: Supervision/Verbal cueing Max wheelchair distance: >150 ft    Wheelchair 50 feet with 2 turns activity    Assist        Assist Level: Supervision/Verbal cueing   Wheelchair 150 feet activity     Assist      Assist Level: Supervision/Verbal  cueing   Blood pressure 124/67, pulse 89, temperature 98.7 F (37.1 C), temperature source Oral, resp. rate 18, height '5\' 11"'$  (1.803 m), weight 76.4 kg, SpO2 99 %.  Medical Problem List and Plan: 1. 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  Antithrombotics: -DVT/anticoagulation:  SCD             -antiplatelet therapy: N/A 3. Pain Management:  Hydrocodone prn.  4. Mood/Behavior/Sleep: LCSW to follow for evaluation and support.              -antipsychotic agents: N/a 5. Neuropsych/cognition: This patient is capable of making decisions on his own behalf. 6. Skin/Wound Care: Routine pressure relief measures.             -- Per ENT, The flap under the tongue is evolving with some necrosis of the skin that may later require debridement.            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 He'll have to wait and see what type of recovery he'll have. Deferred to ENT for further discussion 7. Fluids/Electrolytes/Nutrition: Monitor I/O. Continue            -- Dysphagia 1 diet with thins, tolerating   -added protein supp for low albumin  -pt eating 50-100% of meals  8. HTN: Monitor BP TID. Continue hydralazine and coreg.   10/21- BP controlled- con't regimen 9. ABLA: stable 12-11. Admission labs.  10. Leucocytosis: WBC trending down---8.2 on 10/18             --no fevers or other signs of infection.              --encourage pulmonary hygiene             -- s/p 7 days Unasyn 11. Radical neck dissection with trach: Lives alone in an apartment. --Ordered trach team for education on trach care.  -- Friends to help out after d/c?  -- Tolerating Shiley #6 with PMV. Given short los,  he will go home with trach and f/u with ENT ~ 11/07/21 10/21- not using O2 via trach- just PMV in  place- talking easily.     LOS: 4 days A FACE TO FACE EVALUATION WAS PERFORMED  Nathaniel Hicks 11/01/2021, 10:23 AM

## 2021-11-01 NOTE — Progress Notes (Signed)
Occupational Therapy Session Note  Patient Details  Name: Nathaniel Hicks MRN: [id number] Date of Birth: Mar 18, 1940  Today's Date: 11/01/2021 OT Individual Time: 9444-6190 OT Individual Time Calculation (min): 75 min    Short Term Goals: Week 1:  OT Short Term Goal 1 (Week 1): STGs = LTGs  Skilled Therapeutic Interventions/Progress Updates:     Pt received in recliner with no  pain. ADL: Pt completes ADL at overall supervision Level. Skilled interventions include: showering in tub room to simulate home environment with shower chair. Pt able to use grab bars and step into and out of shower with supervision. Mild slip on bottom of tub getting out but no A needed to recover. Pt reporting having grab bars at home with reliance on to stand and wash buttocks and negotiate step over. Need to confirm this is in fact home set up. Pt dresses sit to stand with supervision from TTB in bathroom. Pt needing cues to not spray head to wash hair but use wash cloth d/t trach and trach cover.     Therapeutic activity Edu re energy conservation: 4 P's Prioritize, Plan, Pace and Position. Provided handout with examples for both BADLs and IADLs for suggested adaptations. Used analogy of a gas tank for improved understanding of concept which carried over well.   Trail making B twister board completed with written list and pt only able to complete through 6-f with increased frustration. Pt reporting fatigue with activity needing seated rest. Pt stating, "this makes me think too hard"   Pt left at end of session in recliner with exit alarm on, call light in reach and all needs met   Therapy Documentation Precautions:  Precautions Precautions: Fall Precaution Comments: trach Other Brace: PMV all waking hours Restrictions Weight Bearing Restrictions: No  Therapy/Group: Individual Therapy  Tonny Branch 11/01/2021, 6:39 AM

## 2021-11-02 NOTE — Progress Notes (Signed)
Physical Therapy Session Note  Patient Details  Name: Nathaniel Hicks MRN: [id number] Date of Birth: 26-Jun-1940  Today's Date: 11/02/2021 PT Individual Time: 1015-1100 PT Individual Time Calculation (min): 45 min   Short Term Goals: Week 1:  PT Short Term Goal 1 (Week 1): STG=LTG due to ELOS.  Skilled Therapeutic Interventions/Progress Updates:     Patient in bed upon PT arrival. Patient alert and agreeable to PT session. Patient reported 5/10 back pain during session, RN made aware. PT provided repositioning, rest breaks, and distraction as pain interventions throughout session.   PMSV in place prior to and throughout session, patient on RA throughout session. SPO2: >96%, HR 80-110 bpm with activity.  Therapeutic Activity: Bed Mobility: Patient performed supine to sit with mod I from a flat bed without use of bed rails. Transfers: Patient performed an ambulatory toilet transfer with supervision-set-up assist using SPC. Patient was continent of bowel and bladder during toileting. Performed peri-care and hand hygiene independently. Patient then performed lower and upper body dressing with set-up assist, doffing hospital gown and and non-skid socks and donning boxers, jeans, socks, shoes, and pull over shirt. Required x1 cue for donning shoes before standing to pull his pants up for safety. He performed sit to/from stand x3 with mod I using SPC from the hospital recliner and standard arm chair.  Gait Training:  Patient ambulated >100 feet and >200 feet using SPC with distant supervision without LOB. Ambulated with decreased gait speed, decreased step length and height, increased BOS, increased lateral trunk sway, forward trunk lean, and downward head gaze. Provided verbal cues for erect posture for improved visual scanning and breath support, increase step height, reduced BOS to shoulder width apart, and increased lateral hip activation in stance to reduce trunk sway.  Patient ascended/descended  12x6" steps using SPC and 1 rail with supervision. Performed step-to gait pattern leading with L while ascending and R while descending. Provided cues for technique and sequencing. Reinforced education on increased safety and energy conservation by using step-to gait pattern  Patient in recliner in the room at end of session with breaks locked, chair alarm set, and all needs within reach.   Therapy Documentation Precautions:  Precautions Precautions: Fall Precaution Comments: trach Other Brace: PMV all waking hours Restrictions Weight Bearing Restrictions: No    Therapy/Group: Individual Therapy  Johanny Segers L Altus Zaino PT, DPT, NCS, CBIS  11/02/2021, 7:03 PM

## 2021-11-02 NOTE — Progress Notes (Signed)
PROGRESS NOTE   Subjective/Complaints:  Pt reports just doesn't feel right- mouth makes him "not comfortable"- is unhappy about this.   "Doesn't feel the best'.  But admits "never feels good" since surgery for throat cancer.   Ate100% tray.  Also says somewhat better when gets up with therapy.  LBM yesterday.    ROS:   Pt denies SOB, abd pain, CP, N/V/C/D, and vision changes  Objective:   No results found. No results for input(s): "WBC", "HGB", "HCT", "PLT" in the last 72 hours.  No results for input(s): "NA", "K", "CL", "CO2", "GLUCOSE", "BUN", "CREATININE", "CALCIUM" in the last 72 hours.   Intake/Output Summary (Last 24 hours) at 11/02/2021 1250 Last data filed at 11/02/2021 0801 Gross per 24 hour  Intake 708 ml  Output 1700 ml  Net -992 ml        Physical Exam: Vital Signs Blood pressure (!) 122/56, pulse 86, temperature 97.8 F (36.6 C), resp. rate 14, height '5\' 11"'$  (1.803 m), weight 76.4 kg, SpO2 100 %.    General: awake, alert, appropriate, sitting up in bedside chair- NAD HENT: conjugate gaze; oropharynx dry- Trach with PMV in place- talking easily CV: regular rate; no JVD Pulmonary: CTA B/L; no W/R/R- good air movement- sounds good GI: soft, NT, ND, (+)BS Psychiatric: appropriate but flat- appears depressed Neurological: Ox3  Ext: no clubbing, cyanosis, or edema Skin: surgical wounds cdi. Old wounds on legs.  Neuro:  Alert and oriented x 3. Normal insight and awareness. Intact Memory. Normal language. Cranial nerve exam unremarkable, speech dysartrhic. UE motor 4-5/5. LUE 4+/5 prox to distal. No focal sensory findings. No abnl tone. Good sitting balance Musculoskeletal: good posture, no pain with rom.     Assessment/Plan: 1. Functional deficits which require 3+ hours per day of interdisciplinary therapy in a comprehensive inpatient rehab setting. Physiatrist is providing close team  supervision and 24 hour management of active medical problems listed below. Physiatrist and rehab team continue to assess barriers to discharge/monitor patient progress toward functional and medical goals  Care Tool:  Bathing    Body parts bathed by patient: Right arm, Right upper leg, Left upper leg, Left arm, Chest, Right lower leg, Left lower leg, Front perineal area, Abdomen, Buttocks, Face         Bathing assist Assist Level: Supervision/Verbal cueing     Upper Body Dressing/Undressing Upper body dressing   What is the patient wearing?: Button up shirt    Upper body assist Assist Level: Set up assist    Lower Body Dressing/Undressing Lower body dressing      What is the patient wearing?: Pants, Underwear/pull up     Lower body assist Assist for lower body dressing: Set up assist     Toileting Toileting    Toileting assist Assist for toileting: Supervision/Verbal cueing Assistive Device Comment: urinal   Transfers Chair/bed transfer  Transfers assist     Chair/bed transfer assist level: Minimal Assistance - Patient > 75%     Locomotion Ambulation   Ambulation assist      Assist level: Contact Guard/Touching assist Assistive device: Cane-straight Max distance: 200   Walk 10 feet activity   Assist  Assist level: Contact Guard/Touching assist Assistive device: Cane-straight   Walk 50 feet activity   Assist    Assist level: Contact Guard/Touching assist Assistive device: Cane-straight    Walk 150 feet activity   Assist    Assist level: Contact Guard/Touching assist Assistive device: Cane-straight    Walk 10 feet on uneven surface  activity   Assist     Assist level: Minimal Assistance - Patient > 75%     Wheelchair     Assist Is the patient using a wheelchair?: Yes Type of Wheelchair: Manual    Wheelchair assist level: Supervision/Verbal cueing Max wheelchair distance: >150 ft    Wheelchair 50 feet with 2  turns activity    Assist        Assist Level: Supervision/Verbal cueing   Wheelchair 150 feet activity     Assist      Assist Level: Supervision/Verbal cueing   Blood pressure (!) 122/56, pulse 86, temperature 97.8 F (36.6 C), resp. rate 14, height '5\' 11"'$  (1.803 m), weight 76.4 kg, SpO2 100 %.  Medical Problem List and Plan: 1. 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  Antithrombotics: -DVT/anticoagulation:  SCD             -antiplatelet therapy: N/A 3. Pain Management:  Hydrocodone prn.   10/22- pain usually controlled except "cannot get comfortable"- con't regimen 4. Mood/Behavior/Sleep: LCSW to follow for evaluation and support.   10/22- pt might benefit from SSRI/SNRI- appears moderately depressed             -antipsychotic agents: N/a 5. Neuropsych/cognition: This patient is capable of making decisions on his own behalf. 6. Skin/Wound Care: Routine pressure relief measures.             -- Per ENT, The flap under the tongue is evolving with some necrosis of the skin that may later require debridement.            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 He'll have to wait and see what type of recovery he'll have. Deferred to ENT for further discussion 7. Fluids/Electrolytes/Nutrition: Monitor I/O. Continue            -- Dysphagia 1 diet with thins, tolerating   -added protein supp for low albumin  -pt eating 50-100% of meals 10/22- ate 100% tray this Am and yesterday for breakfast  8. HTN: Monitor BP TID. Continue hydralazine and coreg.   11/01/20- BP controlled- con't regimen 9. ABLA: stable 12-11. Admission labs.  10. Leucocytosis: WBC trending  down---8.2 on 10/18             --no fevers or other signs of infection.              --encourage pulmonary hygiene             -- s/p 7 days Unasyn 11. Radical neck dissection with trach: Lives alone in an apartment. --Ordered trach team for education on trach care.  --  Friends to help out after d/c?  -- Tolerating Shiley #6 with PMV. Given short los, he will go home with trach and f/u with ENT ~ 11/07/21 10/21- not using O2 via trach- just PMV in place- talking easily.     LOS: 5 days A FACE TO FACE EVALUATION WAS PERFORMED  Sherryl Valido 11/02/2021, 12:50 PM

## 2021-11-03 DIAGNOSIS — Z930 Tracheostomy status: Secondary | ICD-10-CM

## 2021-11-03 DIAGNOSIS — D72829 Elevated white blood cell count, unspecified: Secondary | ICD-10-CM

## 2021-11-03 DIAGNOSIS — Z85819 Personal history of malignant neoplasm of unspecified site of lip, oral cavity, and pharynx: Secondary | ICD-10-CM

## 2021-11-03 LAB — CBC
HCT: 33.1 % — ABNORMAL LOW (ref 39.0–52.0)
Hemoglobin: 10.9 g/dL — ABNORMAL LOW (ref 13.0–17.0)
MCH: 32.8 pg (ref 26.0–34.0)
MCHC: 32.9 g/dL (ref 30.0–36.0)
MCV: 99.7 fL (ref 80.0–100.0)
Platelets: 420 10*3/uL — ABNORMAL HIGH (ref 150–400)
RBC: 3.32 MIL/uL — ABNORMAL LOW (ref 4.22–5.81)
RDW: 13.8 % (ref 11.5–15.5)
WBC: 7.2 10*3/uL (ref 4.0–10.5)
nRBC: 0 % (ref 0.0–0.2)

## 2021-11-03 LAB — BASIC METABOLIC PANEL
Anion gap: 6 (ref 5–15)
BUN: 17 mg/dL (ref 8–23)
CO2: 26 mmol/L (ref 22–32)
Calcium: 10 mg/dL (ref 8.9–10.3)
Chloride: 103 mmol/L (ref 98–111)
Creatinine, Ser: 0.85 mg/dL (ref 0.61–1.24)
GFR, Estimated: >60 mL/min (ref >60)
Glucose, Bld: 105 mg/dL — ABNORMAL HIGH (ref 70–99)
Potassium: 4.2 mmol/L (ref 3.5–5.1)
Sodium: 135 mmol/L (ref 135–145)

## 2021-11-03 NOTE — Progress Notes (Signed)
PROGRESS NOTE   Subjective/Complaints:  Seen at bedside this AM. He would like to get moving with therapy. Had had visit with Dr. Sima Matas this morning.   LBM today  ROS:   Pt denies SOB, abd pain, cough, CP, N/V/C/D, and vision changes  Objective:   No results found. Recent Labs    11/03/21 0553  WBC 7.2  HGB 10.9*  HCT 33.1*  PLT 420*    Recent Labs    11/03/21 0553  NA 135  K 4.2  CL 103  CO2 26  GLUCOSE 105*  BUN 17  CREATININE 0.85  CALCIUM 10.0     Intake/Output Summary (Last 24 hours) at 11/03/2021 1148 Last data filed at 11/03/2021 0843 Gross per 24 hour  Intake 558 ml  Output 1150 ml  Net -592 ml         Physical Exam: Vital Signs Blood pressure 126/63, pulse 85, temperature 98.1 F (36.7 C), resp. rate 18, height '5\' 11"'$  (1.803 m), weight 76.4 kg, SpO2 100 %.    General: awake, alert, appropriate, sitting up in bed,  NAD HENT: conjugate gaze; oropharynx dry- Trach with PMV in place- talking easily CV: regular rate; no JVD Pulmonary: CTA B/L; no W/R/R- non-labored GI: soft, NT, ND, (+)BS Psychiatric: appropriate but a little flat- Neurological: Ox3  Ext: no clubbing, cyanosis, or edema Skin: surgical wounds cdi. Old wounds on legs.  Neuro:  Alert and oriented x 3. Normal insight and awareness. Intact Memory. Normal language. Cranial nerve exam unremarkable, speech dysartrhic. UE motor 4-5/5. LUE 4+/5 prox to distal. No focal sensory findings. No abnl tone. Good sitting balance Musculoskeletal: good posture, no pain with rom.     Assessment/Plan: 1. Functional deficits which require 3+ hours per day of interdisciplinary therapy in a comprehensive inpatient rehab setting. Physiatrist is providing close team supervision and 24 hour management of active medical problems listed below. Physiatrist and rehab team continue to assess barriers to discharge/monitor patient progress toward  functional and medical goals  Care Tool:  Bathing    Body parts bathed by patient: Right arm, Right upper leg, Left upper leg, Left arm, Chest, Right lower leg, Left lower leg, Front perineal area, Abdomen, Buttocks, Face         Bathing assist Assist Level: Supervision/Verbal cueing     Upper Body Dressing/Undressing Upper body dressing   What is the patient wearing?: Button up shirt    Upper body assist Assist Level: Set up assist    Lower Body Dressing/Undressing Lower body dressing      What is the patient wearing?: Pants, Underwear/pull up     Lower body assist Assist for lower body dressing: Set up assist     Toileting Toileting    Toileting assist Assist for toileting: Supervision/Verbal cueing Assistive Device Comment: urinal   Transfers Chair/bed transfer  Transfers assist     Chair/bed transfer assist level: Minimal Assistance - Patient > 75%     Locomotion Ambulation   Ambulation assist      Assist level: Contact Guard/Touching assist Assistive device: Cane-straight Max distance: 200   Walk 10 feet activity   Assist     Assist level:  Contact Guard/Touching assist Assistive device: Cane-straight   Walk 50 feet activity   Assist    Assist level: Contact Guard/Touching assist Assistive device: Cane-straight    Walk 150 feet activity   Assist    Assist level: Contact Guard/Touching assist Assistive device: Cane-straight    Walk 10 feet on uneven surface  activity   Assist     Assist level: Minimal Assistance - Patient > 75%     Wheelchair     Assist Is the patient using a wheelchair?: Yes Type of Wheelchair: Manual    Wheelchair assist level: Supervision/Verbal cueing Max wheelchair distance: >150 ft    Wheelchair 50 feet with 2 turns activity    Assist        Assist Level: Supervision/Verbal cueing   Wheelchair 150 feet activity     Assist      Assist Level: Supervision/Verbal cueing    Blood pressure 126/63, pulse 85, temperature 98.1 F (36.7 C), resp. rate 18, height '5\' 11"'$  (1.803 m), weight 76.4 kg, SpO2 100 %.  Medical Problem List and Plan: 1. 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  Antithrombotics: -DVT/anticoagulation:  SCD             -antiplatelet therapy: N/A 3. Pain Management:  Hydrocodone prn.   10/22- pain usually controlled except "cannot get comfortable"- con't regimen 4. Mood/Behavior/Sleep: LCSW to follow for evaluation and support.   10/22- pt might benefit from SSRI/SNRI- appears moderately depressed             -antipsychotic agents: N/a 5. Neuropsych/cognition: This patient is capable of making decisions on his own behalf. 6. Skin/Wound Care: Routine pressure relief measures.             -- Per ENT, The flap under the tongue is evolving with some necrosis of the skin that may later require debridement.            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 He'll have to wait and see what type of recovery he'll have. Deferred to ENT for further discussion 7. Fluids/Electrolytes/Nutrition: Monitor I/O. Continue            -- Dysphagia 1 diet with thins, tolerating   -added protein supp for low albumin  -pt eating 50-100% of meals 10/22- ate 100% tray this Am and yesterday for breakfast  8. HTN: Monitor BP TID. Continue hydralazine and coreg.   11/01/20- BP controlled- con't regimen  10/23 well controlled, continue current medications 9. ABLA: stable 12-11. Admission labs.   -10/23 HGB down to 10.9 10. Leucocytosis: WBC trending down---8.2 on 10/18             --no fevers or other signs of infection.              --encourage  pulmonary hygiene             -- s/p 7 days Unasyn  -10/23 WBC down to 7/2 today 11. Radical neck dissection with  trach: Lives alone in an apartment. --Ordered trach team for education on trach care.  -- Friends to help out after d/c?  -- Tolerating Shiley #6 with PMV. Given short los, he will go home with trach and f/u with ENT ~ 11/07/21 10/21- not using O2 via trach- just PMV in place- talking easily.     LOS: 6 days A FACE TO FACE EVALUATION WAS PERFORMED  Jennye Boroughs 11/03/2021, 11:48 AM

## 2021-11-03 NOTE — Discharge Instructions (Signed)
Inpatient Rehab Discharge Instructions  WHITT AULETTA Discharge date and time:  11/06/21  Activities/Precautions/ Functional Status: Activity: no lifting, driving, or strenuous exercise till cleared by MD Diet:  Pureed foods  NO STRAWS.  Wound Care:  Trach care twice a day as advised.  Functional status:  ___ No restrictions     ___ Walk up steps independently _X__ 24/7 supervision/assistance   ___ Walk up steps with assistance ___ Intermittent supervision/assistance  ___ Bathe/dress independently ___ Walk with walker     ___ Bathe/dress with assistance ___ Walk Independently    ___ Shower independently ___ Walk with assistance    ___ Shower with assistance _X__ No alcohol     ___ Return to work/school ________   COMMUNITY REFERRALS UPON DISCHARGE:    Outpatient: PT     OT    ST                 Agency: [facility]            Phone: [phone]             Appointment Date/Time: *Please expect follow-up within 7-10 business days to schedule your home visit. If you have not received follow-up, be sure to make contact the site directly.*  Medical Equipment/Items Ordered: trach care kits and supplies, portable suction, humidification, and shower seat with back                                                 Agency/Supplier: [facility] [phone]  GENERAL COMMUNITY RESOURCES FOR PATIENT/FAMILY: The following referral was made:  1) [facility] with [facility] [phone] was completed. Be sure to follow-up to check the status of the referral.     Special Instructions:    My questions have been answered and I understand these instructions. I will adhere to these goals and the provided educational materials after my discharge from the hospital.  Patient/Caregiver Signature _______________________________ Date __________  Clinician Signature _______________________________________ Date __________  Please bring this form and your medication list with you  to all your follow-up doctor's appointments.

## 2021-11-03 NOTE — Progress Notes (Signed)
Physical Therapy Session Note  Patient Details  Name: Nathaniel Hicks MRN: [id number] Date of Birth: May 22, 1940  Today's Date: 11/03/2021 PT Individual Time: 1020-1130 PT Individual Time Calculation (min): 70 min   Short Term Goals: Week 1:  PT Short Term Goal 1 (Week 1): STG=LTG due to ELOS.  Skilled Therapeutic Interventions/Progress Updates:     Session 1: Patient in bed upon PT arrival. Patient alert and agreeable to PT session. Patient denied pain during session. Patient with increased mouth "irritation" this morning, required intermittent rest breaks to reduce patient frustration or distraction due to sensation.   PMSV in place prior to and throughout session, patient on RA throughout session. SPO2: >96%, HR 80-110 bpm with activity.  Therapeutic Activity: Bed Mobility: Patient performed supine to sit in a hospital bed and supine to/from sit in the ADL bed with mod I for increased time.  Transfers: Patient performed lower and upper body dressing with set-up assist, doffing hospital gown and and non-skid socks and donning boxers, jeans, socks, shoes, and pull over shirt. Required x1 cue for donning shoes before standing to pull his pants up for safety. He performed sit to/from stand from the hospital bed, standard arm chair, ADL couch, ADL bed, and mat table with mod I using SPC from the hospital recliner and standard arm chair.  Gait Training:  Patient ambulated >100 feet and >200 feet using SPC with distant supervision without LOB. Ambulated with decreased gait speed, decreased step length and height, increased BOS, increased lateral trunk sway, forward trunk lean, and improved visual scanning and looking ahead without cues today. Provided verbal cues for erect posture, increase step height, reduced BOS to shoulder width apart, and increased lateral hip activation in stance to reduce trunk sway.  Patient ascended/descended 12x6" steps x2 using SPC and 1 rail and first trial and B hand  rails simulating someone holding his cane for home entry on second trial with supervision with both techniques. Performed step-to gait pattern leading with L while ascending and R while descending. Provided cues for technique and sequencing.   Neuromuscular Re-ed: Berg Balance Test Sit to Stand: Able to stand without using hands and stabilize independently Standing Unsupported: Able to stand safely 2 minutes Sitting with Back Unsupported but Feet Supported on Floor or Stool: Able to sit safely and securely 2 minutes Stand to Sit: Sits safely with minimal use of hands Transfers: Able to transfer safely, minor use of hands Standing Unsupported with Eyes Closed: Able to stand 10 seconds safely Standing Ubsupported with Feet Together: Able to place feet together independently and stand for 1 minute with supervision From Standing, Reach Forward with Outstretched Arm: Can reach forward >12 cm safely (5") From Standing Position, Pick up Object from Floor: Able to pick up shoe, needs supervision From Standing Position, Turn to Look Behind Over each Shoulder: Turn sideways only but maintains balance Turn 360 Degrees: Able to turn 360 degrees safely but slowly Standing Unsupported, Alternately Place Feet on Step/Stool: Able to complete >2 steps/needs minimal assist Standing Unsupported, One Foot in Front: Able to plae foot ahead of the other independently and hold 30 seconds Standing on One Leg: Tries to lift leg/unable to hold 3 seconds but remains standing independently Total Score: 42/56 (improved from 11/56 on 10/18) Patient demonstrated increased fall risk noted by score of 42/56 on the Berg Balance Scale.  <45/56 = fall risk, <42/56 = predictive of recurrent falls, <40/56 = 100% fall risk  >41 = independent, 21-40 = assistive device,  0-20 = wheelchair level  MDC 6.9 (4 pts 45-56, 5 pts 35-44, 7 pts 25-34) (ANPTA Core Set of Outcome Measures for Adults with Neurologic Conditions, 2018) Five times  Sit to Stand Test (FTSS) Method: Use a straight back chair with a solid seat that is 17-18" high. Ask participant to sit on the chair with arms folded across their chest.   Instructions: "Stand up and sit down as quickly as possible 5 times, keeping your arms folded across your chest."   Measurement: Stop timing when the participant touches the chair in sitting the 5th time. TIME: 30 sec Cut off scores indicative of increased fall risk: >12 sec CVA, >16 sec PD, >13 sec vestibular (ANPTA Core Set of Outcome Measures for Adults with Neurologic Conditions, 2018)  Therapeutic Exercise: Patient performed the following exercises with verbal and tactile cues for proper technique. -sit to stand without upper extremity support x10 -alternating step taps x10 with single upper extremity support -standing feet together without upper extremity support x1 min   Patient in recliner in the room at end of session with breaks locked, chair alarm set, and all needs within reach.   Session 2: Patient in recliner upon PT arrival. Patient alert and agreeable to PT session. Patient reported mouth discomfort and R thigh fatigue/discomfort during session, patient declined pain medication. PT provided repositioning, rest breaks, and distraction as pain interventions throughout session.   Patient continuously reporting "I feel bad" throughout session. Upon further questioning patient does report feelings of depression from prolonged hospital stay and discomfort from surgery. Provided therapeutic listening and discussed coping strategies during session. Focused session on increased gait distance for improved gait mechanics with SPC and endurance with gait. Had patient ambulate to the end of the [place], >300 ft, for change in scenery and outdoor view at end of the hall. Patient provided with seated rest break before returning back to the room. Patient tolerated treatment well. Ambulated with supervision  throughout. Provided min cues for safety with AD, erect posture, and increased step height R>L. Patient with mildly improved affect after.  Patient performed an ambulatory transfer to/from the bathroom with supervision with [facility]. Patient was continent of bladder during toileting, performed peri-care and lower body clothing management independently. Patient requested to change into a hospital gown to return to bed. Provided with hospital gown and patient doffed shirt, pants, shoes, and boxers and donned the hospital gown with set-up assist and mod-I for transfers and standing balance. He performed sit to supine independently in a flat bed without use of bed rails.   Patient in bed at end of session with breaks locked, bed alarm set, and all needs within reach.   Therapy Documentation Precautions:  Precautions Precautions: Fall Precaution Comments: trach Other Brace: PMV all waking hours Restrictions Weight Bearing Restrictions: No    Therapy/Group: Individual Therapy  Nathaniel Hicks PT, DPT, NCS, CBIS  11/03/2021, 12:13 PM

## 2021-11-03 NOTE — Progress Notes (Signed)
Occupational Therapy Session Note  Patient Details  Name: Nathaniel Hicks MRN: [id number] Date of Birth: 09-Aug-1940  Today's Date: 11/03/2021 OT Individual Time: 1300-1400 OT Individual Time Calculation (min): 60 min    Short Term Goals: Week 1:  OT Short Term Goal 1 (Week 1): STGs = LTGs  Skilled Therapeutic Interventions/Progress Updates:    Patient agreeable to participate in OT session. Reports 0/10 pain level.   Patient participated in skilled OT session focusing on ADL re-training while integrating standing balance, safety awareness and judgement while navigating in room and bathroom to shower. Therapist provided patient education on importance of being aware of surroundings as well as body awareness when standing and performing functional self care tasks in order to promote pt's independence when completing self care tasks at home. Pt undressed prior to entering bathroom. With socks removed, pt attempted to stand  up from recliner although lost his footing sitting back down quickly due to no traction on feet. Non-slip socks were donned and pt stood without LOB and was successfully was able maintain balance in order to navigate into bathroom in order to complete toileting and shower.  Pt stood at toilet to complete toileting. In shower, tub bench utilized for increased safety. Pt sat through majority of shower to bath while using grab bar as needed when standing and washing. Pt sat to dry off before navigating back to recliner to dress. Dressing completed at Mod I level while seated including LB and UB dressing.  Pt completed functional navigating from room to small therapy gym with SBA and SPC. No LOB noted    Pt participated in standing balance and endurance task using BITS to increase ability to complete standing self care tasks at home with increased safety. BITS program Visual scanning: single target standing, alternating between right and left UE, 3'. No error. No LOB. Visual  scanning complex array, standing. RUE used, 3' trial completed. Able to locate 1-30 out of 50. No error or LOB.    VSS at end session: 97% SpO2 107 HR  Therapy Documentation Precautions:  Precautions Precautions: Fall Precaution Comments: trach Other Brace: PMV all waking hours Restrictions Weight Bearing Restrictions: No   Therapy/Group: Individual Therapy  Ailene Ravel, OTR/L,CBIS  Supplemental OT - [place] and [facility]  11/03/2021, 7:48 AM

## 2021-11-03 NOTE — Consult Note (Signed)
Neuropsychological Consultation   Patient:   Nathaniel Hicks   DOB:   [birth year]  MR Number:  [id number]  Location:  [facility] [address] CENTER [address] [place] [id number] [place] [address] Dept: [place]: [phone]           Date of Service:   11/03/2021  Start Time:   8 AM End Time:   9 AM  Provider/Observer:  Ilean Skill, Psy.D.       Clinical Neuropsychologist       Billing Code/Service: 02585  Chief Complaint:    Nathaniel Hicks was referred for neuropsychological consultation due to coping and adjustment issues after surgical interventions for oral cancer.  Patient has a past medical history including testicular cancer, BPH, HTN, hard of hearing, vitamin D deficiency.  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  Postop patient developed fever due to SIRS on 10/5 and was started on antibiotics due to concerns of aspiration pneumonia and/or wound infection.  Nausea vomiting resolved and cortak placed for nutritional support.  PT/OT have been working with patient continues to be limited by weakness, tachycardia with activity due to deconditioning.  He is now been admitted to CIR due to functional decline.  Reason for Service:  Patient was referred for neuropsychological consultation due to coping and adjustment issues with recent history of surgical interventions for oral cancer and a history of past testicular cancer and development of debility and need for inpatient rehabilitation services.  Below is the HPI for the current admission.  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 Redmond Baseman.  Post op NPO with NGT for decompression due to nausea/vomiting but noted to have coffee ground drainage in NGT and developed fevers due to SIRS on 10/05. He was stated on IV antibiotics due to concerns aspiration PNA and/or wound infection.  NGT was pulled out and patient refused replacement but N/V resolved and cortak placed of nutritional support. Scopolamine patch added to  manage copious secretion from trach and he was started on PMSV trials.    BSS showed mild oral dysphagia and has been advanced to  D1, thins. He is tolerating PMSV during daytime hours and cortak removed on 10/12 with calorie count ongoing. Marland Kitchen PT/OT has been working with patient who continues to be limited by weakness and tachycardia with activity due to deconditioning. CIR recommended due to functional decline.   Current Status:  Patient was awake and alert with TV very loud due to his hard of hearing status.  Patient turned off the television so we could communicate more effectively.  Patient was awake and alert and was able to describe recent reason for surgical interventions.  Patient was aware he is in the hospital and was able to recall and describe various efforts that of been going on with rehabilitative efforts including PT/OT/speech.  Patient describes frustration with change of sensation in his mouth postsurgery although he felt that the explanations that he has received from attending have been helpful understanding what to expect and that these sensations will change over the next couple of weeks.  Was explained to the patient  that it was impossible to describe specific changes post surgical interventions as there are lots of nerves and sensations involved in functioning of the inside of one's mouth.  Patient acknowledged frustration but denied that anxiety or depressive type symptomatology were keeping him from being engaged in therapeutic interventions and patient verbalized motivation to continue to work diligently in  anticipation of discharge.  Behavioral Observation: Nathaniel Hicks  presents as a 81 y.o.-year-old Right African American Male who appeared his stated age. his dress was Appropriate and he was Well Groomed and his manners were Appropriate to the situation.  his participation was indicative of Appropriate behaviors.  There were physical disabilities noted.  he displayed an appropriate level of cooperation and motivation.     Interactions:    Active Appropriate  Attention:   within normal limits and attention span and concentration were age appropriate  Memory:   within normal limits; recent and remote memory intact  Visuo-spatial:  not examined  Speech (Volume):  low  Speech:   normal; slurred  Thought Process:  Coherent and Relevant  Though Content:  WNL; not suicidal and not homicidal  Orientation:   person, place, time/date, and situation  Judgment:   Fair  Planning:   Fair  Affect:    Appropriate  Mood:    Dysphoric  Insight:   Good  Intelligence:   normal  Medical History:   Past Medical History:  Diagnosis Date   Arthritis    BPH (benign prostatic hyperplasia)    Difficult intubation    HOH (hard of hearing)    Hyperlipidemia 11/02/2017   Hypertension    Hypertension 11/02/2017   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[place])    2014   Vitamin D deficiency 11/02/2017         Patient Active Problem List   Diagnosis Date Noted   History of mouth cancer    Debility 10/28/2021   Malnutrition of moderate degree 10/16/2021   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   Abscess of oral space 07/23/2021   Tobacco use disorder 07/23/2021   Physical deconditioning 07/23/2021   Sepsis ([place]) 07/23/2021   Elevated troponin 07/23/2021   Left facial swelling 07/14/2021   BPH with obstruction/lower urinary tract symptoms 11/05/2020   History of primary testicular cancer; Stage 1b seminoma; 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  Patient has a past medical history including testicular cancer, BPH, HTN, vitamin D deficiency.  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  Postop patient developed fever due to SIRS on 10/5 and was started on antibiotics due to concerns of aspiration pneumonia and/or wound infection.  Nausea vomiting resolved and cortak placed for nutritional support.  PT/OT have been working with patient continues to be limited by weakness, tachycardia with activity due to deconditioning.  He is now been admitted to CIR due to functional decline.  Patient was awake and alert with TV very loud due to his hard of hearing status.  Patient turned off the television so we could communicate more effectively.  Patient was awake and alert and was able to describe recent reason for surgical interventions.  Patient was aware he is in the hospital and was able to recall and describe various efforts that of been going on with rehabilitative efforts including PT/OT/speech.  Patient describes frustration with change of sensation in his mouth postsurgery although he felt that the explanations that he has received from attending have been helpful understanding what to expect and that these sensations will change over the next couple of weeks.  Was explained to the patient that it was impossible to describe specific changes post surgical interventions as  there are lots of nerves and sensations involved in functioning of the inside of one's mouth.  Patient acknowledged frustration but denied that anxiety or depressive type symptomatology were keeping him from being engaged in therapeutic interventions and patient verbalized motivation to continue to work diligently in anticipation of discharge.  Disposition/Plan:  Today we worked on coping and adjustment issues and addressed questions and concerns that the patient had.  Diagnosis:    Debility with recent surgical interventions for mouth cancer.         Electronically Signed   _______________________ Ilean Skill, Psy.D. Clinical Neuropsychologist

## 2021-11-03 NOTE — Progress Notes (Addendum)
Patient ID: Nathaniel Hicks, male   DOB: 11-20-1940, 81 y.o.   MRN: [id number]  SW followed up with pt sister [name] to confirm family edu tomorrow. She reports she will be here at 9am to meet with respiratory about trach care, and will be present for fam edu 1030am-12pm. SW shared therapy reports on pt mobility doing well, however, lacks fine motor skills and trach care management and recommends 24/7 care. She reports she and her siblings are not able to provide 24/7 care and are able to check in throughout the day. SW shared resources that can be put in place such as PCS and SW waiting on final recommendations HH or Outpatient. SW will discuss in more detail.  Through discussion with medical team on above, recommends HH therapies. SW sent HHPT/OT/SLP/SN referral to Cory/[facility], Kelly/[facility], Cheryl/[facility], Angie/[facility], Ashley/[facility], and [facility] and waitign on follow-up.  SW spoke with [facility] [phone]) to discuss referral and reports no staffing in area.   SW spoke with [facility] (p:[phone]/f:[phone]) to discuss referral and reports no coverage in area.   SW spoke with Angie/[facility] [phone]) to discuss referral. Reports they do not have speech therapy at this time. SW will follow-up if medical team willing to d/c speech therapy at discharge.   SW spoke with Jennifer/[facility] to discuss referral and reports no staffing in area at this time.   Declined HHAs Kelly/[facility] Cheryl/[facility]- no staffing in area Amy/[facility] Angie/[facility]- no SLP.   Loralee Pacas, MSW, [place] Office: [phone] Cell: [phone] Fax: [phone]

## 2021-11-03 NOTE — Progress Notes (Signed)
Speech Language Pathology Daily Session Note  Patient Details  Name: JENSEN KILBURG MRN: [id number] Date of Birth: [birth year]  Today's Date: 11/03/2021 SLP Individual Time: 0037-0488 SLP Individual Time Calculation (min): 39 min  Short Term Goals: Week 1: SLP Short Term Goal 1 (Week 1): STGs=LTGs due to ELOS  Skilled Therapeutic Interventions: Skilled treatment session focused on speech goals. Upon arrival, patient was awake while PMSV in place. All vitals remained [facility] throughout session. With extra time, patient able to donn/doff PMSV independently. SLP focused on intelligibility with an informal conversation regarding anticipatory awareness and d/c planning. Patient becoming intermittently verbally agitated when answering details of discharge such as meal prep due to being on pureed textures and trach care due to fine motor deficits. Patient reported that his sister would be present to assist with both. Throughout conversation, patient was ~85% intelligible.  Patient left upright in bed with alarm on and all needs within reach. Continue with current plan of care.      Pain No/Denies Pain   Therapy/Group: Individual Therapy  Leander Tout 11/03/2021, 3:53 PM

## 2021-11-04 ENCOUNTER — Telehealth: Payer: Self-pay | Admitting: Internal Medicine

## 2021-11-04 DIAGNOSIS — C069 Malignant neoplasm of mouth, unspecified: Secondary | ICD-10-CM | POA: Diagnosis not present

## 2021-11-04 DIAGNOSIS — Z930 Tracheostomy status: Secondary | ICD-10-CM

## 2021-11-04 DIAGNOSIS — E440 Moderate protein-calorie malnutrition: Secondary | ICD-10-CM

## 2021-11-04 NOTE — Telephone Encounter (Signed)
Called and spoke to Nathaniel Hicks and notified her that we do not do trach care at [facility] but gave her recommendation of Nathaniel Griffon MD to try. Nothing further needed at this time.

## 2021-11-04 NOTE — Progress Notes (Signed)
Occupational Therapy Session Note  Patient Details  Name: Nathaniel Hicks MRN: [id number] Date of Birth: [birth year]  Today's Date: 11/04/2021 OT Individual Time: 0700-0800 OT Individual Time Calculation (min): 60 min   Today's Date: 11/04/2021 OT Individual Time: 1030-1100 OT Individual Time Calculation (min): 30 min  family education  Short Term Goals: Week 1:  OT Short Term Goal 1 (Week 1): STGs = LTGs Week 2:     Skilled Therapeutic Interventions/Progress Updates:     Pt received in EOB with unrated back pain, RN alerted to pain medicaiton  ADL: Pt completes ADL at overall S-MOD I Level. Skilled interventions include: education to not leave cane behind during gathering of items especially with increased back pain/stiffness. Pt able to gather all needed items at ambulatory level with MOD I, stand at sink to wash bending forward to wash lower legs (not feet) and dress sit to stand with MOD I at recliner. Pt then completes standing balance/endurance task of changing bed linnens with Supervision/cuing for novel sheets. Pt educated on IADL adaptations (long handled dust pan v swiffer for floors in kitchen and long handed duster for living room furniture).  Pt left at end of session in recliner with exit alarm on, call light in reach and all needs met  Session 2: family education--  Pt received in recliner with no pain  Therapeutic activity Sisters present with pt for family education. OT educates on energy conservation, long handled cleaning equipment, and rolling adaptations for transporting items (laundry) for IADL safety for higher level activities. Pt demo mobility with SPC and tub shower transfers with MOD I stepping over edge of tub with use of grab bar. Family with no questions except about cooking meals and deferred to SLP for appropriate strategies for textures.   Pt left at end of session in recliner with exit alarm on, call light in reach and all needs met   Therapy  Documentation Precautions:  Precautions Precautions: Fall Precaution Comments: trach Other Brace: PMV all waking hours Restrictions Weight Bearing Restrictions: No General:     Therapy/Group: Individual Therapy  Tonny Branch 11/04/2021, 6:44 AM

## 2021-11-04 NOTE — Progress Notes (Signed)
Physical Therapy Discharge Summary  Patient Details  Name: Nathaniel Hicks MRN: [id number] Date of Birth: Feb 29, 1940  Date of Discharge from PT service:{MONTH:10108} {NUMBERS 1-31 (DATE):31396}, {YEAR HISTORY:31397}  {CHL IP REHAB PT TIME CALCULATION:304800500}   Patient has met {NUMBERS 0-12:18577} of 8 long term goals due to improved activity tolerance, improved balance, improved postural control, decreased pain, ability to compensate for deficits, and improved coordination.  Patient to discharge at an ambulatory level Modified Independent with [facility] for household mobility, supervision with [facility] for community mobility.   Patient's care partner is independent to provide the necessary physical assistance at discharge.  Reasons goals not met: ***  Recommendation:  Patient will benefit from ongoing skilled PT services in {setting:3041680} to continue to advance safe functional mobility, address ongoing impairments in balance, activity tolerance, gait and stair training, community integration, strengthening, patient/caregiver education, and minimize fall risk.  Equipment: Recommending SPC with all mobility  Reasons for discharge: treatment goals met  Patient/family agrees with progress made and goals achieved: Yes  PT Discharge Precautions/Restrictions Precautions Precautions: Fall Precaution Comments: trach Required Braces or Orthoses: Other Brace Other Brace: PMV all waking hours Restrictions Weight Bearing Restrictions: No Pain Interference   Vision/Perception  Vision - History Ability to See in Adequate Light: 0 Adequate Perception Perception: Within Functional Limits Praxis Praxis: Intact  Cognition Overall Cognitive Status: Within Functional Limits for tasks assessed Arousal/Alertness: Awake/alert Orientation Level: Oriented X4 Awareness: Appears intact Problem Solving: Appears intact Safety/Judgment: Appears intact Sensation Sensation Light Touch: Appears  Intact Hot/Cold: Appears Intact Proprioception: Appears Intact Stereognosis: Not tested Coordination Gross Motor Movements are Fluid and Coordinated: No Fine Motor Movements are Fluid and Coordinated: Yes Coordination and Movement Description: 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, slower movement patterns but functional for self care tasks Heel Shin Test: limited by hip flexor weakness Motor  Motor Motor: Abnormal postural alignment and control Motor - Discharge Observations: generalized weakness and decreased postural control (improving overall)  Mobility Bed Mobility Rolling Right: Independent Rolling Left: Independent Supine to Sit: Independent Sit to Supine: Independent (bed mobility performed with HOB elevated per MD orders) Transfers Sit to Stand: Independent with assistive device Stand to Sit: Independent with assistive device Stand Pivot Transfers: Independent with assistive device Transfer (Assistive device): Straight cane Locomotion  Gait Ambulation: Yes Gait Assistance: Supervision/Verbal cueing;Independent with assistive device Gait Distance (Feet): 300 Feet Assistive device: Straight cane Gait Assistance Details: supervision for safety in community settings Gait Gait: Yes Gait Pattern: Impaired Gait Pattern: Step-through pattern;Decreased stance time - right;Decreased dorsiflexion - right;Lateral hip instability;Decreased trunk rotation;Trunk flexed;Wide base of support Gait velocity: decreased Stairs / Additional Locomotion Stairs: Yes Stairs Assistance: Supervision/Verbal cueing Stair Management Technique: One rail Left;With cane Number of Stairs: 12 Height of Stairs: 6 Ramp: Supervision/Verbal cueing (using SPC) Curb: Supervision/Verbal cueing (using SPC) Pick up small object from the floor assist level: Independent with assistive device Pick up small object from the floor assistive device: R upper extremity support Wheelchair Mobility Wheelchair Mobility: No   Trunk/Postural Assessment  Cervical Assessment Cervical Assessment: Within Functional Limits Thoracic Assessment Thoracic Assessment: Within Functional Limits Lumbar Assessment Lumbar Assessment: Within Functional Limits Postural Control Postural Control: Within Functional Limits  Balance Berg Balance Test Sit to Stand: Able to stand without using hands and stabilize independently Standing Unsupported: Able to stand safely 2 minutes Sitting with Back Unsupported but Feet Supported on Floor or Stool: Able to sit safely and securely 2 minutes Stand to Sit: Sits safely with minimal use of hands Transfers: Able to  transfer safely, minor use of hands Standing Unsupported with Eyes Closed: Able to stand 10 seconds safely Standing Ubsupported with Feet Together: Able to place feet together independently and stand for 1 minute with supervision From Standing, Reach Forward with Outstretched Arm: Can reach forward >12 cm safely (5") From Standing Position, Pick up Object from Floor: Able to pick up shoe, needs supervision From Standing Position, Turn to Look Behind Over each Shoulder: Turn sideways only but maintains balance Turn 360 Degrees: Able to turn 360 degrees safely but slowly Standing Unsupported, Alternately Place Feet on Step/Stool: Able to complete >2 steps/needs minimal assist Standing Unsupported, One Foot in Front: Able to plae foot ahead of the other independently and hold 30 seconds Standing on One Leg: Tries to lift leg/unable to hold 3 seconds but remains standing independently Total Score: 42 Static Standing Balance Static Standing - Level of Assistance: 6: Modified independent (Device/Increase time) Dynamic Standing Balance Dynamic Standing - Level of Assistance: 6: Modified independent (Device/Increase time) Extremity Assessment        Cherie L Grunenberg PT, DPT, NCS, CBIS  11/04/2021, 12:45 PM

## 2021-11-04 NOTE — Progress Notes (Signed)
Physical Therapy Session Note  Patient Details  Name: Nathaniel Hicks MRN: [id number] Date of Birth: 19-Jul-1940  Today's Date: 11/04/2021 PT Individual Time: 0762-2633 PT Individual Time Calculation (min): 45 min   Short Term Goals: Week 1:  PT Short Term Goal 1 (Week 1): STG=LTG due to ELOS.  Skilled Therapeutic Interventions/Progress Updates:     Session 1: Patient in recliner with his sister, [name], in the room upon PT arrival. Patient alert and agreeable to PT session. Patient reported 5/10 mid to low back pain this morning, resolved to 0/10 during session, RN premedicated patient prior to session. PT provided repositioning, rest breaks, and distraction as pain interventions throughout session.   Discussed d/c planning. Patient's sister with questions regarding patient's diet, differed to SLP education scheduled at 1100 this morning. Reported patient was on Dysphasia 1 diet with puree textures per SLP chart in room. His sister reports RT scheduled trach care training at 0900, RN made aware of patient's location during session to allow patient to return for education if RT arrived during session.   Therapeutic Activity: Transfers: Patient performed sit to/from stand with mod I using SPC throughout session.  Patient performed ambulatory transfer to/from the bathroom with distant supervision using SPC. Patient was continent of bladder and performed peri-care and lower body clothing management independently.   Gait Training:  Patient ambulated >50 feet x2 using SPC with distant supervision. Ambulated with decreased gait speed, decreased step length and height, increased BOS, increased lateral trunk sway, forward trunk lean, and improved visual scanning and looking ahead without cues today. Provided verbal cues for erect posture, increase step height, reduced BOS to shoulder width apart, and increased lateral hip activation in stance to reduce trunk sway.   Therapeutic Exercise: Patient  performed the following exercises with verbal and tactile cues for proper technique. -NuStep x10 min Level 4 with >50 SPM using B upper and lower extremities, focused on cardiovascular endurance with cues for paced breathing and maintaining SPM throughout; SPO2 100% HR 100 bpm  Patient in recliner with his sister, MD, and RT in the room at end of session with breaks locked, chair alarm set, and all needs within reach.   Session 2: Patient in recliner handed off from SLP for continued family educaiton upon PT arrival. Patient alert and agreeable to PT session. Patient denied pain during session, reports back pain remains improved.  Patient's sisters present for family education and hands on training throughout session. Performed safe guarding with all mobility following PT cues and/or demonstration. Educated on fall risk/prevention, home modifications to prevent falls, and activation of emergency services in the event of a fall during session. Patient reports he has a emergence call button at home he will start wearing and will keep his cell phone in his pocket, as he will be home alone most of the time.   Focused family education on community mobility and fall education (see above), as his sisters will only assist with patient to appointments and in the community.   Therapeutic Activity: Transfers: Patient performed sit to/from stand with mod I using SPC throughout session. Patient performed a simulated sedan height car transfer with supervision using car frame and step-in technique. Provided cues for safe technique, encouraged use of turn to sit prior to placing legs in the car for reduced fall risk with transfers. Patient and family in agreement.  Gait Training:  Patient ambulated >50 feet x2 using SPC with supervision-mod I. Ambulated as above. Provided verbal cues for safe guarding  technique in community settings and observance of distractions and obstacles that may place patient at increased  fall risk. Patient ambulated up/down a ramp, over 10 feet of mulch (unlevel surface), and up/down a curb to simulate community ambulation over unlevel surfaces with supervision using SPC. Provided cues for safe guarding technique and use of AD.  Patient and his sisters in agreement that patient will use the elevator, not the stairs, at his apartment complex to access his second level apartment at d/c. Will continue to work on stair training for patient to progress to stair access with follow-up PT.   Patient in recliner with his sisters in the room at end of session with breaks locked, chair alarm set, and all needs within reach.    Therapy Documentation Precautions:  Precautions Precautions: Fall Precaution Comments: trach Other Brace: PMV all waking hours Restrictions Weight Bearing Restrictions: No    Therapy/Group: Individual Therapy  Jacquelyn Antony L Deneise Getty PT, DPT, NCS, CBIS  11/04/2021, 10:20 AM

## 2021-11-04 NOTE — Progress Notes (Signed)
RT came by to assess pts trach, but pt is not currently in his room at this time. RT will try back later.

## 2021-11-04 NOTE — Progress Notes (Signed)
PROGRESS NOTE   Subjective/Complaints:  Pt finishing up with working with PT. Continues to have oral discomfort. Reports no new concerns this AM. His sister in in his room.  LBM today  ROS:   Pt denies SOB, abd pain, cough, CP, N/V/C/D, and vision changes  Objective:   No results found. Recent Labs    11/03/21 0553  WBC 7.2  HGB 10.9*  HCT 33.1*  PLT 420*     Recent Labs    11/03/21 0553  NA 135  K 4.2  CL 103  CO2 26  GLUCOSE 105*  BUN 17  CREATININE 0.85  CALCIUM 10.0      Intake/Output Summary (Last 24 hours) at 11/04/2021 0831 Last data filed at 11/04/2021 0130 Gross per 24 hour  Intake 600 ml  Output 1025 ml  Net -425 ml         Physical Exam: Vital Signs Blood pressure (!) 140/62, pulse 88, temperature 98.4 F (36.9 C), temperature source Oral, resp. rate 16, height '5\' 11"'$  (1.803 m), weight 76.4 kg, SpO2 100 %.    General: awake, alert, appropriate, ambulating with PT without LOB and sat in bedside chair,  NAD HENT: conjugate gaze; oropharynx dry- Trach with PMV in place- talking easily CV: RRR; no JVD Pulmonary: CTA B/L; no W/R/R- non-labored GI: soft, NT, ND, (+)BS Psychiatric: appropriate but a little flat- Ext: no clubbing, cyanosis, or edema Skin: surgical wounds cdi. Old wounds on legs.  Neuro:  Alert and oriented x 3. Normal insight and awareness. Intact Memory. Normal language. Cranial nerve exam unremarkable, speech dysarthric. UE motor 4-5/5. LUE 4+/5 prox to distal. No focal sensory findings. No abnl tone.  Musculoskeletal: good posture, no pain with rom.     Assessment/Plan: 1. Functional deficits which require 3+ hours per day of interdisciplinary therapy in a comprehensive inpatient rehab setting. Physiatrist is providing close team supervision and 24 hour management of active medical problems listed below. Physiatrist and rehab team continue to assess barriers to  discharge/monitor patient progress toward functional and medical goals  Care Tool:  Bathing    Body parts bathed by patient: Right arm, Right upper leg, Left upper leg, Left arm, Chest, Right lower leg, Left lower leg, Front perineal area, Abdomen, Buttocks, Face         Bathing assist Assist Level: Supervision/Verbal cueing     Upper Body Dressing/Undressing Upper body dressing   What is the patient wearing?: Button up shirt    Upper body assist Assist Level: Set up assist    Lower Body Dressing/Undressing Lower body dressing      What is the patient wearing?: Pants, Underwear/pull up     Lower body assist Assist for lower body dressing: Set up assist     Toileting Toileting    Toileting assist Assist for toileting: Supervision/Verbal cueing Assistive Device Comment: urinal   Transfers Chair/bed transfer  Transfers assist     Chair/bed transfer assist level: Minimal Assistance - Patient > 75%     Locomotion Ambulation   Ambulation assist      Assist level: Contact Guard/Touching assist Assistive device: Cane-straight Max distance: 200   Walk 10 feet activity  Assist     Assist level: Contact Guard/Touching assist Assistive device: Cane-straight   Walk 50 feet activity   Assist    Assist level: Contact Guard/Touching assist Assistive device: Cane-straight    Walk 150 feet activity   Assist    Assist level: Contact Guard/Touching assist Assistive device: Cane-straight    Walk 10 feet on uneven surface  activity   Assist     Assist level: Minimal Assistance - Patient > 75%     Wheelchair     Assist Is the patient using a wheelchair?: Yes Type of Wheelchair: Manual    Wheelchair assist level: Supervision/Verbal cueing Max wheelchair distance: >150 ft    Wheelchair 50 feet with 2 turns activity    Assist        Assist Level: Supervision/Verbal cueing   Wheelchair 150 feet activity     Assist       Assist Level: Supervision/Verbal cueing   Blood pressure (!) 140/62, pulse 88, temperature 98.4 F (36.9 C), temperature source Oral, resp. rate 16, height '5\' 11"'$  (1.803 m), weight 76.4 kg, SpO2 100 %.  Medical Problem List and Plan: 1. 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  Antithrombotics: -DVT/anticoagulation:  SCD             -antiplatelet therapy: N/A 3. Pain Management:  Hydrocodone prn.   10/22- pain usually controlled except "cannot get comfortable"- con't regimen 4. Mood/Behavior/Sleep: LCSW to follow for evaluation and support.   10/22- pt might benefit from SSRI/SNRI- appears moderately depressed             -antipsychotic agents: N/a 5. Neuropsych/cognition: This patient is capable of making decisions on his own behalf. 6. Skin/Wound Care: Routine pressure relief measures.             -- Per ENT, The flap under the tongue is evolving with some necrosis of the skin that may later require debridement.            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 He'll have to wait and see what type of recovery he'll have. Deferred to ENT for further discussion 7. Fluids/Electrolytes/Nutrition: Monitor I/O. Continue            -- Dysphagia 1 diet with thins, tolerating   -added protein supp for low albumin  -pt eating 50-100% of meals 10/24 only ate 10% this AM, eating most of his meals overall, continue to monitor 8. HTN: Monitor BP TID. Continue hydralazine and coreg.   11/01/20- BP controlled- con't regimen  10/24 well controlled overall, continue to monitor 9. ABLA: stable 12-11.   -10/23 HGB stable at 10.9 10. Leucocytosis: WBC  trending down---8.2 on 10/18             --no fevers or other signs of infection.              --encourage pulmonary hygiene             --  s/p 7 days Unasyn  -10/23 WBC down to 7.2 today 11. Radical neck dissection with trach: Lives alone in an apartment. --Ordered trach team for education on trach care.  -- Friends to help out after d/c?  -- Tolerating Shiley #6 with PMV. Given short los, he will go home with trach and f/u with ENT ~ 11/07/21 10/21- not using O2 via trach- just PMV in place- talking easily.  May benefit from f/u in trach clinic as outpatient     LOS: 7 days A FACE TO FACE EVALUATION WAS PERFORMED  Jennye Boroughs 11/04/2021, 8:31 AM

## 2021-11-04 NOTE — Progress Notes (Signed)
Occupational Therapy Session Note  Patient Details  Name: Nathaniel Hicks MRN: [id number] Date of Birth: 05/31/1940  Today's Date: 11/04/2021 OT Individual Time: 7793-9030 OT Individual Time Calculation (min): 30 min    Short Term Goals: Week 1:  OT Short Term Goal 1 (Week 1): STGs = LTGs  Skilled Therapeutic Interventions/Progress Updates:   Pt greeted seated in recliner, pt reports need to void b/b. Ambulatory toilet transfer to bathroom with SPC and CGA, pt completed 3/3 toileting tasks with CGA- set- up assist, pt with + urine/bowel void. Pt needed set- up assist of wash cloths for posterior pericare. Pt completed hand hygiene at sink with supervision. Pt completed functional ambulation down to apt with SPC and supervision, pt able to demo ability to step over tub while holding on to grab bars on R side with CGA, pt doesn't recall which side his grab bars are on.  Pt also demonstrated ambulatory transfers to recliner and couch with [facility] and supervision.  Remainder of session focus on dynamic standing balance using BITS as precursor to higher level ADls. BITS set- up to present with letters A-Z on screen pt instructed to sequence through letters reaching out of BOS to touch each letter on screen, pt completed task with supervision with no LOB, however pt presents with low frustration tolerance noted to become frustrated when pt could not recall next letter. Pt could not recall ( N, R, U) and then declined to complete more of task d/t frustration. Pt also noted to touch letters on screen with fist, often missing letters. Pt ambulated back to room with [facility] and supervision. Pt left seated in recliner with alarm activated and all needs within reach.   PMV donned during session, on RA with SpO2 97-96%   Therapy Documentation Precautions:  Precautions Precautions: Fall Precaution Comments: trach Required Braces or Orthoses: Other Brace Other Brace: PMV all waking hours Restrictions Weight  Bearing Restrictions: No  Pain:  No pain   Therapy/Group: Individual Therapy  Precious Haws 11/04/2021, 3:48 PM

## 2021-11-04 NOTE — Progress Notes (Addendum)
Speech Language Pathology Daily Session Note  Patient Details  Name: Nathaniel Hicks MRN: [id number] Date of Birth: 04-Mar-1940  Today's Date: 11/04/2021 SLP Individual Time: 1100-1140 SLP Individual Time Calculation (min): 40 min  Short Term Goals: Week 1: SLP Short Term Goal 1 (Week 1): STGs=LTGs due to ELOS  Skilled Therapeutic Interventions: S: Pt seen this date for skilled ST intervention focusing on pt and family education. Pt received awake/alert and OOB in recliner chair. Two sisters present for education. Agreeable to ST intervention in hospital room.  O: SLP facilitated today's session by providing: - Extensive written and verbal education re: pureed diet (allowable foods and foods to avoid), guidelines for blending foods at home, pre-packaged puree options for purchase, importance of only eating smooth pureed foods, PMSV care, aspiration risk, and safe swallowing strategies handout to include not to use straws, to crush medications and place in puree, and allow extra time when eating and drinking. - Donn/Doffing, cleaning and hand hygiene guidelines for PMSV. - F/u ST intervention at next venue of care, if eligible. Pt and pt's sisters verbalized understanding and were appreciative of education provided; all questions answered to the satisfaction of all parties. Stated that they feel comfortable helping pt at home with swallowing and PMSV care.  A: Pt remains sitmulable for skilled ST intervention as evident by the need for less assistance for donn and doffing of PMSV and implementing safe swallowing strategies. Recommend f/u ST intervention at next venue of care following CIR d/c.  P: Pt left in room and OOB in recliner chair; direct hand off to PT with family present. Continue per current ST POC next session.   Pain None reported; NAD  Therapy/Group: Individual Therapy  Nathaniel Hicks 11/04/2021, 1:23 PM

## 2021-11-04 NOTE — Patient Care Conference (Signed)
Inpatient RehabilitationTeam Conference and Plan of Care Update Date: 11/04/2021   Time: 10:12 AM    Patient Name: Nathaniel Hicks      Medical Record Number: [id number]  Date of Birth: 02/10/1940 Sex: Male         Room/Bed: 4M12C/4M12C-01 Payor Info: Payor: Marine scientist / Plan: [facility] MEDICARE / Product Type: *No Product type* /    Admit Date/Time:  10/28/2021  3:25 PM  Primary Diagnosis:  [facility] Problems: Principal Problem:   Debility Active Problems:   Oral cancer ([place])   Malnutrition of moderate degree   Tracheostomy in place [facility])    Expected Discharge Date: Expected Discharge Date: 11/06/21  Team Members Present: Physician leading conference: Dr. Jennye Boroughs Social Worker Present: Loralee Pacas, [place] Nurse Present: Other (comment) Tacy Learn, RN) PT Present: Apolinar Junes, PT OT Present: Mariane Masters, OT SLP Present: Weston Anna, SLP PPS Coordinator present : Ileana Ladd, PT     Current Status/Progress Goal Weekly Team Focus  Bowel/Bladder   Continent of B/B  Remain continent  Assess Qshift and prn   Swallow/Nutrition/ Hydration   Dys. 1 textures with thin liquids, Mod I  Mod I  Family Education   ADL's   Supervision for ADLs and transfers  MOD I  safety awareness, IADL adapations, energy conservation. DC planning   Mobility   mod I with bed mobility and transfers, supervision gait >300 ft and 12 steps B rails  Mod I household mobility, supervision community mobility  Activity tolerance, functional mobility, balance, strengthening, gait and stair training, patient/caregiver education   Communication   Supervision-Mod I, 90-100% intelligibility  Mod I  Family Education   Safety/Cognition/ Behavioral Observations  Supervision-Mod I for recall of functional information  Supervision  Family Education   Pain   No c/o pain  Pain <3/10  Assess Qshift and prn   Skin   Surgical wound, Remainder of skin intact   Maintain skin integrity  Assess Qshift and prn     Discharge Planning:  D/c to home with PRN support from his sisters. Fam edu scheduled for Tuesday (10/24) 1030-12pm with pt sister [name]. No HHA willing to accept referral at this time. Only agency willing to accept referral, does not have SLP which is what patient needs at discharge. Pt can go to outpatient therapies ,but no support for trach. PCS referral will be made for aide assistance due to assistance needed with showers. Pt not eligible for private duty nursing (PDN) due to no 24/7 caregiver which is a Medicaid requirement.  SW will continue to explore other options.   Team Discussion: Debility. Continent B/B. Denies pain. Skin is CDI. #6 Shiley flexible uncuffed. Trach consult sent. Patient has limited fine motor skills limiting care of trach. Patient will be going home alone and family has been educated that patient will require 24/7 care. Diet is D1/thin. Patient resistant to D1 diet texture. Family also has been educated that all food needs to be purred. Patient is able to don and doff PSM[employer].  Patient on target to meet rehab goals: yes, patient ambulating 367f and taking 12 ste with bilateral rails.  *See Care Plan and progress notes for long and short-term goals.   Revisions to Treatment Plan:  NA  Teaching Needs: Medications, diet, trach care, skin/wound care, gait/transfer training, etc  Current Barriers to Discharge: Home enviroment access/layout, Trach, Lack of/limited family support, and Insurance for SNF coverage  Possible Resolutions to Barriers: Family education, trach education, order  recommended DME     Medical Summary Current Status: trach, back pain, HTN, ABLA, mouth pain  Barriers to Discharge: Decreased family/caregiver support;Medical stability;Trach  Barriers to Discharge Comments: trach, back pain, HTN, ABLA, mouth pain Possible Resolutions to [employer]: modified diet, trach care, monitor BP,  therapy helping with pain   Continued Need for Acute Rehabilitation Level of Care: The patient requires daily medical management by a physician with specialized training in physical medicine and rehabilitation for the following reasons: Direction of a multidisciplinary physical rehabilitation program to maximize functional independence : Yes Medical management of patient stability for increased activity during participation in an intensive rehabilitation regime.: Yes Analysis of laboratory values and/or radiology reports with any subsequent need for medication adjustment and/or medical intervention. : Yes   I attest that I was present, lead the team conference, and concur with the assessment and plan of the team.   Ernest Pine 11/04/2021, 1:20 PM

## 2021-11-04 NOTE — Progress Notes (Signed)
Patient ID: Nathaniel Hicks, male   DOB: 1940/02/29, 81 y.o.   MRN: [id number]  SW met with pt, and pt siblings to provide updates from team conference on gains made, and d/c date 10/26. SW shared continued challenges with obtaining a HHA due to care needs, insurance, and staffing. SW informed will seek a trach care clinic in area, and pt will have outpatient PT/OT/SLP. SW also informed pt trach care kits and supplies, and portable suction will be delivered to his room prior to discharge. Pt sister Nathaniel Hicks reports she is open to coming in to meet with RT again on Thursday before patient leaves. SW will confirm if this is needed. She will also call his insurance to confirm if transportation and home meals is an option for him.   SW ordered trach care kits and supplies and portable suction and humidification, and shower seat with back with [employer] via parachute.   SW spoke with Carol/RT on challenges with putting HHA in place. States family will need to call to schedule this appointment with the ENT clinic upon discharge.   SW faxed PCS referral to [facility] (p:[phone]/[phone]).  Loralee Pacas, MSW, [place] Office: [phone] Cell: [phone] Fax: [phone]

## 2021-11-04 NOTE — Progress Notes (Signed)
Patient's sister in today for additional trach education and hands on training.  Was able tto review and also teach trach suctioning and educate on purpose of humidification.  Nathaniel Hicks will need a few more hands on training sessions.  Will contact [place] to schedule for another time session this week.

## 2021-11-05 ENCOUNTER — Other Ambulatory Visit (HOSPITAL_BASED_OUTPATIENT_CLINIC_OR_DEPARTMENT_OTHER): Payer: Self-pay

## 2021-11-05 DIAGNOSIS — C069 Malignant neoplasm of mouth, unspecified: Secondary | ICD-10-CM

## 2021-11-05 MED ORDER — ORAL CARE MOUTH RINSE: 15.0000 mL | Freq: Three times a day (TID) | OROMUCOSAL | 0 refills | Status: DC

## 2021-11-05 MED ORDER — AMLODIPINE BESYLATE 10 MG PO TABS
10.0000 mg | ORAL_TABLET | Freq: Every day | ORAL | 0 refills | Status: DC
  Filled 2021-11-05: qty 30, 30d supply, fill #0

## 2021-11-05 MED ORDER — POLYETHYLENE GLYCOL 3350 17 GM/SCOOP PO POWD
17.0000 g | Freq: Two times a day (BID) | ORAL | 0 refills | Status: DC
  Filled 2021-11-05: qty 238, 7d supply, fill #0

## 2021-11-05 MED ORDER — ESOMEPRAZOLE MAGNESIUM 10 MG PO PACK
10.0000 mg | PACK | Freq: Two times a day (BID) | ORAL | 0 refills | Status: DC
  Filled 2021-11-05: qty 60, 30d supply, fill #0

## 2021-11-05 MED ORDER — ORAL CARE MOUTH RINSE: 15.0000 mL | OROMUCOSAL | 0 refills | Status: DC | PRN

## 2021-11-05 MED ORDER — CARVEDILOL 6.25 MG PO TABS
6.2500 mg | ORAL_TABLET | Freq: Two times a day (BID) | ORAL | 0 refills | Status: DC
  Filled 2021-11-05: qty 60, 30d supply, fill #0

## 2021-11-05 MED ORDER — HYDRALAZINE HCL 100 MG PO TABS
100.0000 mg | ORAL_TABLET | Freq: Three times a day (TID) | ORAL | 0 refills | Status: DC
  Filled 2021-11-05: qty 90, 30d supply, fill #0

## 2021-11-05 NOTE — Progress Notes (Signed)
Occupational Therapy Discharge Summary  Patient Details  Name: Nathaniel Hicks MRN: [id number] Date of Birth: 1940/08/30    Today's Date: 11/05/2021 OT Individual Time: 0930-1040 OT Individual Time Calculation (min): 70 min   Pt received in recliner with RN present agreeable to OT but reporting feeling "bad" and "I just worked out my legs they are sore. ADL: Pt with all BADL needs met.   Therapeutic exercise 10 min Nustep on Level 5 for general endurance and whole body strengthening. Pt enjoys activity. Educated on silver sneakers and safe return to exercise as pt enjoys using Nustep and feels that it is helpfult. Pt reports having a gym "across the street that is meant for seniors." Discussed talking to follow up therapists about return to exericse and safe machines for pt to use should he decide to pursue that option.    Therapeutic activity Box and Blocks Test measures unilateral gross manual dexterity. - Instructions The pt was instructed to carry one block over at a time and go as quickly as they could, making sure their fingertips crossed the partition. One minute was given to complete the task per UE. The pt was allowed a 15-second trial period prior to testing if needed. - Results The pt transferred 30 blocks with the R hand and 31 with the L hand. The total number of blocks carried from one compartment to the other in one minute is scored per hand. Higher scores on the test indicate better gross manual dexterity.  - Norms for adults males -75+ R 63.0 L 61.3 9 Hole Peg Test is used to measure finger dexterity in pts with various neurological diagnoses. - Instructions The pt was instructed to pick up the pegs one at a time, using their dominant hand first and put them into the holes in any order until the holes were all filled. The pt then removed the pegs one at a time and returned them to the container. Both hands were tested separately.  - Results The pt completed  the test in RUE 43.9 and LUE 1 min 1 seconds. Scores are based on the time taken to complete the activity. The timer started the moment the pt touched the first peg until the moment the last peg hit the container.  - Norms for healthy males ages  63+ R 25.79 L 25.95  bead search in theraputty for 9 beads in tan soft putty with supervision and encouragement to find the last 2 beads d/t low frustration tolerance. Functional mobility back to room with MOD I. b  Pt left at end of session in recliner with exit alarm on, call light in reach and all needs met   Today's Date: 11/05/2021 OT Individual Time: 4098-1191 OT Individual Time Calculation (min): 53 min   Pt received in bed with no pain and sister present reporting waiting for "someone to talk to me about how to puree foods" found SLP who provided handouts and recipes. Therapeutic activity OT constructs extra long strap that way pt can apply trach cover independently for showers. Pt and sister aware that he should have supervision for showers especially in the beginning. Shower chair provided. Pt educated on open/close of continuous pulse ox monitor attatchment  after 3 practice trips around room/back to chiar and able to manage independently therefore made independent in the room and RN/NT aware.  Vacc\uuming task completed with supervision. Pt with poor management of cord often stepping on it initially. Once wound up in hand able to manage vacuum, cord  and balance with no LOB. Educated on only unwinding a little cord at a time to decrease trip hazard at home.   Pt left at end of session in recliner with call light in reach and all needs met   Date of Discharge from [place] 25, 2023 Patient has met 8 of 8 long term goals due to improved activity tolerance, improved balance, postural control, and ability to compensate for deficits.  Patient to discharge at overall Modified Independent level.  Patient's care partner is independent  to provide the necessary  supervision  assistance at discharge. Pts sisters were present for family education over BADLs, IADLs, and supervision of shower/trasnfers. All questions asnswered and content with follow up tx to ensure management of IADLs successful at home.   Reasons goals not met: n/a  Recommendation:  Patient will benefit from ongoing skilled OT services in outpatient setting to continue to advance functional skills in the area of iADL.  Equipment: Shower chair  Reasons for discharge: treatment goals met and discharge from hospital  Patient/family agrees with progress made and goals achieved: Yes  OT Discharge Precautions/Restrictions  Precautions Precautions: Fall Precaution Comments: trach Required Braces or Orthoses: Other Brace Other Brace: PMV all waking hours Restrictions Weight Bearing Restrictions: No Pain Pain Assessment Pain Score: 3  Pain Location: Leg Pain Descriptors / Indicators: Sore ADL ADL Eating: Modified independent Grooming: Modified independent Upper Body Bathing: Modified independent Where Assessed-Upper Body Bathing: Shower Lower Body Bathing: Modified independent Where Assessed-Lower Body Bathing: Shower Upper Body Dressing: Modified independent (Device) Where Assessed-Upper Body Dressing: Sitting at sink Lower Body Dressing: Modified independent Where Assessed-Lower Body Dressing: Sitting at sink Toileting: Modified independent Where Assessed-Toileting: Risk analyst Method: Counselling psychologist: Grab bars, Raised toilet seat Tub/Shower Transfer: Modified independent Vision/Perception  Vision - History Ability to See in Adequate Light: 0 Adequate Perception Perception: Within Functional Limits Praxis Praxis: Intact  Cognition Overall Cognitive Status: Within Functional Limits for tasks assessed Arousal/Alertness: Awake/alert Orientation Level: Oriented  X4 Awareness: Appears intact Problem Solving: Appears intact Safety/Judgment: Appears intact Sensation Sensation Light Touch: Appears Intact Hot/Cold: Appears Intact Proprioception: Appears Intact Stereognosis: Not tested Coordination Gross Motor Movements are Fluid and Coordinated: No Fine Motor Movements are Fluid and Coordinated: Yes Coordination and Movement Description: 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, slower movement patterns but functional for self care tasks Heel Shin Test: limited by hip flexor weakness Motor  Motor Motor: Abnormal postural alignment and control Motor - Discharge Observations: generalized weakness and decreased postural control (improving overall)  Mobility Bed Mobility Rolling Right: Independent Rolling Left: Independent Supine to Sit: Independent Sit to Supine: Independent (bed mobility performed with HOB elevated per MD orders) Transfers Sit to Stand: Independent with assistive device Stand to Sit: Independent with assistive device Stand Pivot Transfers: Independent with assistive device Transfer (Assistive device): Straight cane Trunk/Postural Assessment  Cervical Assessment Cervical Assessment: Within Functional Limits Thoracic Assessment Thoracic Assessment: Within Functional Limits Lumbar Assessment Lumbar Assessment: Within Functional Limits Postural Control Postural Control: Within Functional Limits  Balance Berg Balance Test Sit to Stand: Able to stand without using hands and stabilize independently Standing Unsupported: Able to stand safely 2 minutes Sitting with Back Unsupported but Feet Supported on Floor or Stool: Able to sit safely and securely 2 minutes Stand to Sit: Sits safely with minimal use of hands Transfers: Able to transfer safely, minor use of hands Standing Unsupported with Eyes Closed: Able to stand 10 seconds safely Standing Ubsupported with Feet Together:  Able to place feet together independently and stand for 1 minute with  supervision From Standing, Reach Forward with Outstretched Arm: Can reach forward >12 cm safely (5") From Standing Position, Pick up Object from Floor: Able to pick up shoe, needs supervision From Standing Position, Turn to Look Behind Over each Shoulder: Turn sideways only but maintains balance Turn 360 Degrees: Able to turn 360 degrees safely but slowly Standing Unsupported, Alternately Place Feet on Step/Stool: Able to complete >2 steps/needs minimal assist Standing Unsupported, One Foot in Front: Able to plae foot ahead of the other independently and hold 30 seconds Standing on One Leg: Tries to lift leg/unable to hold 3 seconds but remains standing independently Total Score: 42 Static Standing Balance Static Standing - Level of Assistance: 6: Modified independent (Device/Increase time) Dynamic Standing Balance Dynamic Standing - Level of Assistance: 6: Modified independent (Device/Increase time) Extremity/Trunk Assessment RUE Assessment RUE Assessment: Within Functional Limits Active Range of Motion (AROM) Comments: WFL LUE Assessment LUE Assessment: Within Functional Limits   Tonny Branch 11/05/2021, 9:48 AM

## 2021-11-05 NOTE — Progress Notes (Signed)
PROGRESS NOTE   Subjective/Complaints:  Pt sitting in bedside chair today. No new complaints. His sister is in the room. They are asking about plan for trach f/u with Dr. Redmond Baseman.   LBM 10/25  ROS:   Pt denies SOB, HA, abd pain, cough, CP, N/V/C/D, and vision changes  Objective:   No results found. Recent Labs    11/03/21 0553  WBC 7.2  HGB 10.9*  HCT 33.1*  PLT 420*     Recent Labs    11/03/21 0553  NA 135  K 4.2  CL 103  CO2 26  GLUCOSE 105*  BUN 17  CREATININE 0.85  CALCIUM 10.0      Intake/Output Summary (Last 24 hours) at 11/05/2021 0826 Last data filed at 11/05/2021 0745 Gross per 24 hour  Intake 725 ml  Output 200 ml  Net 525 ml         Physical Exam: Vital Signs Blood pressure 139/67, pulse 92, temperature 98.3 F (36.8 C), temperature source Oral, resp. rate 18, height '5\' 11"'$  (1.803 m), weight 76.4 kg, SpO2 100 %.    General: awake, alert, appropriate, ambulating sitting bedside chair,  NAD, cane at bedside HENT: conjugate gaze; oropharynx dry- Trach with PMV in place- talking easily CV: RRR; no JVD Pulmonary: CTA B/L; no W/R/R- non-labored GI: soft, NT, ND, (+)BS Psychiatric: appropriate, pleasant Ext: no clubbing, cyanosis, or edema Skin: surgical wounds cdi. Old wounds on legs.  Neuro:  Alert and oriented x 3. Normal insight and awareness. Intact Memory. Normal language. Cranial nerve exam unremarkable, speech dysarthric. UE motor 4-5/5. LUE 4+/5 prox to distal. No focal sensory findings. No abnl tone.  Musculoskeletal: good posture, no pain with rom.     Assessment/Plan: 1. Functional deficits which require 3+ hours per day of interdisciplinary therapy in a comprehensive inpatient rehab setting. Physiatrist is providing close team supervision and 24 hour management of active medical problems listed below. Physiatrist and rehab team continue to assess barriers to  discharge/monitor patient progress toward functional and medical goals  Care Tool:  Bathing    Body parts bathed by patient: Right arm, Right upper leg, Left upper leg, Left arm, Chest, Right lower leg, Left lower leg, Front perineal area, Abdomen, Buttocks, Face         Bathing assist Assist Level: Supervision/Verbal cueing     Upper Body Dressing/Undressing Upper body dressing   What is the patient wearing?: Button up shirt    Upper body assist Assist Level: Set up assist    Lower Body Dressing/Undressing Lower body dressing      What is the patient wearing?: Pants, Underwear/pull up     Lower body assist Assist for lower body dressing: Set up assist     Toileting Toileting    Toileting assist Assist for toileting: Supervision/Verbal cueing Assistive Device Comment: urinal   Transfers Chair/bed transfer  Transfers assist     Chair/bed transfer assist level: Independent with assistive device Chair/bed transfer assistive device: Research officer, political party   Ambulation assist      Assist level: Supervision/Verbal cueing Assistive device: Cane-straight Max distance: >300 ft   Walk 10 feet activity   Assist  Assist level: Independent with assistive device Assistive device: Cane-straight   Walk 50 feet activity   Assist    Assist level: Independent with assistive device Assistive device: Cane-straight    Walk 150 feet activity   Assist    Assist level: Supervision/Verbal cueing Assistive device: Cane-straight    Walk 10 feet on uneven surface  activity   Assist     Assist level: Supervision/Verbal cueing Assistive device: Cane-straight   Wheelchair     Assist Is the patient using a wheelchair?: No Type of Wheelchair: Manual    Wheelchair assist level: Supervision/Verbal cueing Max wheelchair distance: >150 ft    Wheelchair 50 feet with 2 turns activity    Assist        Assist Level:  Supervision/Verbal cueing   Wheelchair 150 feet activity     Assist      Assist Level: Supervision/Verbal cueing   Blood pressure 139/67, pulse 92, temperature 98.3 F (36.8 C), temperature source Oral, resp. rate 18, height '5\' 11"'$  (1.803 m), weight 76.4 kg, SpO2 100 %.  Medical Problem List and Plan: 1. Functional deficits secondary to debility s/p radical neck dissection for oral CA             -patient may shower             -ELOS/Goals: 10/27           Con't CIR- PT, OT and SLP     -Plan for DC home tomorrow, sisters being trained on trach care, diet modifications 2.  Antithrombotics: -DVT/anticoagulation:  SCD             -antiplatelet therapy: N/A 3. Pain Management:  Hydrocodone prn.   10/22- pain usually controlled except "cannot get comfortable"- con't regimen 4. Mood/Behavior/Sleep: LCSW to follow for evaluation and support.   10/22- pt might benefit from SSRI/SNRI- appears moderately depressed             -antipsychotic agents: N/a 5. Neuropsych/cognition: This patient is capable of making decisions on his own behalf. 6. Skin/Wound Care: Routine pressure relief measures.             -- Per ENT, The flap under the tongue is evolving with some necrosis of the skin that may later require debridement.            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 He'll have to wait and see what type of recovery he'll have. Deferred to ENT for further discussion 7. Fluids/Electrolytes/Nutrition: Monitor I/O. Continue            -- Dysphagia 1 diet with thins, tolerating   -added protein supp for low albumin 10/25 eating most of his meals 8. HTN: Monitor BP TID. Continue hydralazine and coreg.   11/01/20- BP controlled- con't regimen  10/25 fairly well controlled, continue current medications 9. ABLA: stable 12-11.    -10/23 HGB stable at 10.9 10. Leucocytosis: WBC trending down---8.2 on 10/18             --no fevers or other signs of infection.              --encourage pulmonary hygiene             -- s/p 7 days Unasyn  -10/23 WBC down to 7.2 today 11. Radical neck dissection with trach:  Lives alone in an apartment. --Ordered trach team for education on trach care.  -- Friends to help out after d/c?  -- Tolerating Shiley #6 with PMV. Given short los, he will go home with trach and f/u with ENT ~ 11/07/21 10/21- not using O2 via trach- just PMV in place- talking easily.  10/25 Family asking about plan for Trach - Will call Dr. Redmond Baseman today to discuss, family has been trained on trach care, pt has f/u scheduled with oncology also     LOS: 8 days A FACE TO FACE EVALUATION WAS PERFORMED  Jennye Boroughs 11/05/2021, 8:26 AM

## 2021-11-05 NOTE — Progress Notes (Signed)
Patient ID: Nathaniel Hicks, male   DOB: 09-Dec-1940, 81 y.o.   MRN: [id number]  SW received phone call from [facility] with [facility] about PCS referral. Pt currently approved until nursing assessment has been completed to determine if pt remains appropriate.   SW updated pt sister [name] on above, and confirmed trach care edu tomorrow at 9am is needed with RT.  SW provided trach form to [facility].   SW faxed outpatient PT/OT/SLP referral to [facility] (p:[phone]/[phone]).  Loralee Pacas, MSW, [place] Office: [phone] Cell: [phone] Fax: [phone]

## 2021-11-05 NOTE — Progress Notes (Signed)
Speech Language Pathology Discharge Summary  Patient Details  Name: Nathaniel Hicks MRN: [id number] Date of Birth: Mar 23, 1940  Date of Discharge from [place] 25, 2023  Today's Date: 11/05/2021 SLP Individual Time: 0730-0800 SLP Individual Time Calculation (min): 30 min   Skilled Therapeutic Interventions:   S: Pt seen this date for skilled ST intervention targeting communication goals outlined in care plan. Pt received awake/alert and finishing toileting with NT. Transferred to recliner chair with Sup A. Agreeable to ST intervention in hospital room.  O: Pt demonstrated donn/doffing of PMSV with Sup A to Mod I. Tolerated thin liquid via cup with no clinical s/sx concerning for airway compromised with PMSV donned. Verbalized parameters for PMSV donning and doffing with Mod I. All questions answered. Reviewed diet modification recommendations to include thin liquid via cup (no straws), pureed textures, and use of nutritional supplementation as recommended by RD.  A: Pt remains sitmulable for skilled ST intervention. Recommend f/u ST intervention at next venue of care, in addition to intermittent supervision and assistance at time of d/c from IPR.  P: Pt left in room and OOB in recliner chair with all safety measures activated. Call bell reviewed and within reach and all immediate needs met. Continue per current ST POC next session.  Patient has met 4 of 4 long term goals.  Patient to discharge at overall Modified Independent;Supervision level.  Reasons goals not met: N/A   Clinical Impression/Discharge Summary:    Pt demonstrated progress as evident by meeting all long-term goals. Family education completed. Continue with pureed diet and thin liquid via cup and PMSV during all waking hours and PO intake. Handouts reviewed and provided to pt's sister for how to blend foods appropriately, how to store foods, and liquids to utilize; she verbalized understanding. Pt and family education  completed. Recommend f/u ST intervention at next venue of care and intermittent supervision and assistance.   Care Partner:  Caregiver Able to Provide Assistance: Yes  Type of Caregiver Assistance: Physical;Cognitive  Recommendation:  Outpatient SLP;Other (comment) (Intermittent assistance and supervision)  Rationale for SLP Follow Up: Maximize swallowing safety;Maximize functional communication;Reduce caregiver burden   Equipment: Suctioning kit   Reasons for discharge: Discharged from hospital   Patient/Family Agrees with Progress Made and Goals Achieved: Yes    Mickey Esguerra A Chrissie Dacquisto 11/05/2021, 1:09 PM

## 2021-11-05 NOTE — Plan of Care (Signed)
  Problem: RH Swallowing Goal: LTG Patient will consume least restrictive diet using compensatory strategies with assistance (SLP) Description: LTG:  Patient will consume least restrictive diet using compensatory strategies with assistance (SLP) Outcome: Completed/Met   Problem: RH Expression Communication Goal: LTG Patient will increase speech intelligibility (SLP) Description: LTG: Patient will increase speech intelligibility at word/phrase/conversation level with cues, % of the time (SLP) Outcome: Completed/Met   Problem: RH Memory Goal: LTG Patient will demonstrate ability for day to day (SLP) Description: LTG:   Patient will demonstrate ability for day to day recall/carryover during cognitive/linguistic activities with assist  (SLP) Outcome: Completed/Met Goal: LTG Patient will use memory compensatory aids to (SLP) Description: LTG:  Patient will use memory compensatory aids to recall biographical/new, daily complex information with cues (SLP) Outcome: Completed/Met

## 2021-11-06 ENCOUNTER — Other Ambulatory Visit (HOSPITAL_COMMUNITY): Payer: Self-pay

## 2021-11-06 ENCOUNTER — Telehealth: Payer: Self-pay | Admitting: Physical Medicine & Rehabilitation

## 2021-11-06 MED ORDER — ESOMEPRAZOLE MAGNESIUM 10 MG PO PACK: 10.0000 mg | PACK | Freq: Two times a day (BID) | ORAL | 0 refills | Status: DC

## 2021-11-06 MED ORDER — ESOMEPRAZOLE MAGNESIUM 40 MG PO PACK: 40.0000 mg | PACK | Freq: Two times a day (BID) | ORAL | 12 refills | Status: DC

## 2021-11-06 NOTE — Progress Notes (Signed)
PROGRESS NOTE   Subjective/Complaints:  Pt was seen at bedside. No new concerns this AM. He reports Dr. Redmond Baseman came to see him today. He is looking forward to going home.   LBM 10/25  ROS:   Pt denies SOB, HA, abd pain, cough, CP, N/V/C/D, and vision changes + oral discomfort  Objective:   No results found. No results for input(s): "WBC", "HGB", "HCT", "PLT" in the last 72 hours.   No results for input(s): "NA", "K", "CL", "CO2", "GLUCOSE", "BUN", "CREATININE", "CALCIUM" in the last 72 hours.    Intake/Output Summary (Last 24 hours) at 11/06/2021 1112 Last data filed at 11/06/2021 1029 Gross per 24 hour  Intake 472 ml  Output 1250 ml  Net -778 ml         Physical Exam: Vital Signs Blood pressure 124/63, pulse 97, temperature 97.8 F (36.6 C), temperature source Oral, resp. rate 18, height '5\' 11"'$  (1.803 m), weight 76.4 kg, SpO2 100 %.    General: awake, alert, appropriate, in bed NAD, cane at bedside HENT: conjugate gaze; oropharynx moist- Trach with PMV in place- talking easily CV: RRR; no JVD Pulmonary: CTA B/L; no W/R/R- non-labored GI: soft, NT, ND, (+)BS Psychiatric: appropriate, pleasant Ext: no clubbing, cyanosis, or edema Skin: surgical wounds cdi. Old wounds on legs.  Neuro:  Alert and oriented x 3. Normal insight and awareness. Intact Memory. Normal language. Cranial nerve exam unremarkable, speech dysarthric. UE motor 4-5/5. LUE 4+/5 prox to distal. No focal sensory findings. No abnl tone.  Musculoskeletal: good posture, no pain with rom.     Assessment/Plan: 1. Functional deficits which require 3+ hours per day of interdisciplinary therapy in a comprehensive inpatient rehab setting. Physiatrist is providing close team supervision and 24 hour management of active medical problems listed below. Physiatrist and rehab team continue to assess barriers to discharge/monitor patient progress toward  functional and medical goals  Care Tool:  Bathing    Body parts bathed by patient: Right arm, Right upper leg, Left upper leg, Left arm, Chest, Right lower leg, Left lower leg, Front perineal area, Abdomen, Buttocks, Face         Bathing assist Assist Level: Independent with assistive device     Upper Body Dressing/Undressing Upper body dressing   What is the patient wearing?: Pull over shirt    Upper body assist Assist Level: Independent with assistive device    Lower Body Dressing/Undressing Lower body dressing      What is the patient wearing?: Pants, Underwear/pull up     Lower body assist Assist for lower body dressing: Independent with assitive device     Toileting Toileting    Toileting assist Assist for toileting: Independent with assistive device Assistive Device Comment: urinal   Transfers Chair/bed transfer  Transfers assist     Chair/bed transfer assist level: Independent with assistive device Chair/bed transfer assistive device: Research officer, political party   Ambulation assist      Assist level: Supervision/Verbal cueing Assistive device: Cane-straight Max distance: >300 ft   Walk 10 feet activity   Assist     Assist level: Independent with assistive device Assistive device: Cane-straight   Walk 50 feet  activity   Assist    Assist level: Independent with assistive device Assistive device: Cane-straight    Walk 150 feet activity   Assist    Assist level: Supervision/Verbal cueing Assistive device: Cane-straight    Walk 10 feet on uneven surface  activity   Assist     Assist level: Supervision/Verbal cueing Assistive device: Cane-straight   Wheelchair     Assist Is the patient using a wheelchair?: No Type of Wheelchair: Manual    Wheelchair assist level: Supervision/Verbal cueing Max wheelchair distance: >150 ft    Wheelchair 50 feet with 2 turns activity    Assist        Assist Level:  Supervision/Verbal cueing   Wheelchair 150 feet activity     Assist      Assist Level: Supervision/Verbal cueing   Blood pressure 124/63, pulse 97, temperature 97.8 F (36.6 C), temperature source Oral, resp. rate 18, height '5\' 11"'$  (1.803 m), weight 76.4 kg, SpO2 100 %.  Medical Problem List and Plan: 1. Functional deficits secondary to debility s/p radical neck dissection for oral CA             -patient may shower             -ELOS/Goals: 10/27           Con't CIR- PT, OT and SLP     -Plan for DC home today 2.  Antithrombotics: -DVT/anticoagulation:  SCD             -antiplatelet therapy: N/A 3. Pain Management:  Hydrocodone prn.   10/22- pain usually controlled except "cannot get comfortable"- con't regimen 4. Mood/Behavior/Sleep: LCSW to follow for evaluation and support.   10/22- pt might benefit from SSRI/SNRI- appears moderately depressed             -antipsychotic agents: N/a 5. Neuropsych/cognition: This patient is capable of making decisions on his own behalf. 6. Skin/Wound Care: Routine pressure relief measures.             -- Per ENT, The flap under the tongue is evolving with some necrosis of the skin that may later require debridement.            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 He'll have to wait and see what type of recovery he'll have. Deferred to ENT for further discussion 7. Fluids/Electrolytes/Nutrition: Monitor I/O. Continue            -- Dysphagia 1 diet with thins, tolerating   -added protein supp for low albumin 10/26 tolerating DYS 1 diet, eating most of his meals 8. HTN: Monitor BP TID. Continue hydralazine and coreg.   11/01/20- BP controlled- con't regimen  10/26 well controlled overall, continue current regimen 9. ABLA: stable 12-11.   -10/23 HGB stable at 10.9  -Monitor  with PCP as outpatient 10. Leucocytosis: WBC trending down---8.2 on 10/18             --no fevers or other signs of infection.              --encourage pulmonary hygiene             -- s/p 7 days Unasyn  -10/23 WBC down to 7.2 today 11. Radical neck dissection with trach: Lives alone in an apartment. --Ordered trach team for education on trach  care.  -- Friends to help out after d/c?  -- Tolerating Shiley #6 with PMV. Given short los, he will go home with trach and f/u with ENT ~ 11/07/21 10/21- not using O2 via trach- just PMV in place- talking easily.  10/26 Seen by Dr. Redmond Baseman this AM. Dr. Redmond Baseman plans to reassess flap under tongue as outpatient and start debriding edges. Keep trach in place for now.      LOS: 9 days A FACE TO FACE EVALUATION WAS PERFORMED  Jennye Boroughs 11/06/2021, 11:12 AM

## 2021-11-06 NOTE — Discharge Summary (Signed)
Physician Discharge Summary  Patient ID: Nathaniel Hicks MRN: [id number] DOB/AGE: [birth year] 81 y.o.  Admit date: 10/28/2021 Discharge date: 11/06/2021  Discharge Diagnoses:  Principal Problem:   Debility Active Problems:   Oral cancer ([place])   Malnutrition of moderate degree   Tracheostomy in place [facility])   Discharged Condition: stable  Significant Diagnostic Studies: N/A   Labs:  Basic Metabolic Panel:    Latest Ref Rng & Units 11/03/2021    5:53 AM 10/29/2021    7:25 AM 10/22/2021    1:48 AM  BMP  Glucose 70 - 99 mg/dL 105  96  123   BUN 8 - 23 mg/dL '17  18  19   '$ Creatinine 0.61 - 1.24 mg/dL 0.85  0.72  0.70   Sodium 135 - 145 mmol/L 135  135  139   Potassium 3.5 - 5.1 mmol/L 4.2  4.1  4.2   Chloride 98 - 111 mmol/L 103  104  104   CO2 22 - 32 mmol/L '26  22  26   '$ Calcium 8.9 - 10.3 mg/dL 10.0  10.0  9.6      CBC:    Latest Ref Rng & Units 11/03/2021    5:53 AM 10/29/2021    7:25 AM 10/22/2021    1:48 AM  CBC  WBC 4.0 - 10.5 K/uL 7.2  8.2  11.3   Hemoglobin 13.0 - 17.0 g/dL 10.9  10.6  10.8   Hematocrit 39.0 - 52.0 % 33.1  32.7  32.8   Platelets 150 - 400 K/uL 420  352  290      CBG: No results for input(s): "GLUCAP" in the last 168 hours.  Brief HPI:   Nathaniel Hicks is a 81 y.o. 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 Kellie Shropshire op course significant for N/V, fevers due to SIRS as well as concerns of aspiration PNA. He was treated with IV antibiotics and started on scopolamine patch to handle oral secretions. He tolerated PMSV as well as D1 diet with thins liquids. He was noted to be limited by weakness and tachycardia. CIR was recommended due to functional decline.     Hospital Course: [name] was admitted to rehab 10/28/2021  for inpatient therapies to consist of PT, ST and OT at least three hours five days a week. Past admission physiatrist, therapy team and rehab RN have worked together to provide customized collaborative inpatient rehab.  His blood pressures were monitored on TID basis and has been stable. He has been afebrile during his stay and is tolerating D1 diet without difficulty. Follow up check of BMET showed lytes and renal status to be WNL. Repeat CBC showed H/H to be slowly improving. His respiratory status has been stable. Nutritional supplements offered TID for low calorie malnutrition.   Lurline Idol site is C/D/I without drainage and he is tolerating PMSV 24 hours.  He is continent of B/B. Dr. Redmond Baseman followed up with patient prior to discharge and reported that flap under the tongue was gradually declaring itself with degree of necrosis versus healing.  Current plans are to follow-up with patient in the office to see if further debridement would be needed and tracheostomy would be decannulated on outpatient basis.  Family has also been made aware of follow-up  at [facility] for labs and evaluation after discharge.  Trach care kits provided by [employer] [facility] and [facility] referral submitted by LCSW.  Family will provide supervision after discharge.   Rehab course: During patient's stay in rehab weekly team conferences were held to monitor patient's progress, set goals and discuss barriers to discharge. At admission, patient required min assist with basic ADL tasks and with mobility.  He required multimodal cues to don and doff Passy-Muir speaking valve and demonstrated mild dysarthria.  He required min verbal cues to slow rate of intake and declined trials of upgrade textures. He has had improvement in activity tolerance, balance, postural control as well as ability to compensate for deficits.  He is able to don and doff PMSV with supervision to modified independent level. He is tolerating diet without S/S of  aspiration.  He is independent for transfers and to ambulate without AD. Family education has been completed with his sisters.   Disposition: Home  Diet: Pureed diet. Medications crushed with puree. NO STRAWS  Special Instructions: No driving or strenuous activity till cleared by MD.  2. Routine trach care and follow up with Dr. Redmond Baseman for issues with trach or concerns.   Allergies as of 11/06/2021   No Known Allergies      Medication List     STOP taking these medications    amLODipine-benazepril 10-40 MG capsule Commonly known as: LOTREL   gabapentin 300 MG capsule Commonly known as: NEURONTIN   ketotifen 0.025 % ophthalmic solution Commonly known as: ZADITOR   magnesium oxide 400 MG tablet Commonly known as: MAG-OX   oxyCODONE 5 MG immediate release tablet Commonly known as: Oxy IR/ROXICODONE   pantoprazole 40 MG tablet Commonly known as: Protonix       TAKE these medications    amLODipine 10 MG tablet Commonly known as: NORVASC Take 1 tablet (10 mg total) by mouth daily.   BIOFREEZE EX Apply 1 application  topically daily as needed (pain).   carvedilol 6.25 MG tablet Commonly known as: COREG Take 1 tablet (6.25 mg total) by mouth 2 (two) times daily with a meal.   esomeprazole 10 MG packet Commonly known as: NEXIUM Take 10 mg by mouth 2 (two) times daily.   hydrALAZINE 100 MG tablet Commonly known as: APRESOLINE Take 1 tablet (100 mg total) by mouth every 8 (eight) hours. What changed:  medication strength how much to take when to take this   mouth rinse Liqd solution 15 mLs by Mouth Rinse route as needed (oral care).   mouth rinse Liqd solution 15 mLs by Mouth Rinse route 4 (four) times daily - after meals and at bedtime.   polyethylene glycol powder 17 GM/SCOOP powder Commonly known as: GLYCOLAX/MIRALAX Take 1 capfull (17g) by mouth 2 (two) times daily.   pravastatin 80 MG tablet Commonly known as: PRAVACHOL Take 80 mg by mouth  daily.   Rapaflo 8 MG Caps capsule Generic drug: silodosin Take 8 mg by mouth daily. Silodosin   vitamin B-12 500 MCG tablet Commonly known as: CYANOCOBALAMIN Take 500 mcg by mouth daily.   Vitamin D3 50 MCG (2000 UT) Tabs Take 2,000 Units by mouth daily.   Voltaren 1 % Gel Generic drug: diclofenac Sodium Apply 1 g topically daily as needed (Arthritis pain).        Follow-up Information     Celene Squibb, MD Follow up.   Specialty: Internal Medicine Why: Call in 1-2 days for post hospital follow up Contact  information: [place] [facility] [address] [phone]         Meredith Staggers, MD. Call.   Specialty: Physical Medicine and Rehabilitation Why: As needed Contact information: [address] [place] [address] [phone]         Melida Quitter, MD Follow up in 2 week(s).   Specialty: Otolaryngology Why: And for any issues with the trach Contact information: [address] [place] [address] [phone]         [name], MD Follow up on 11/25/2021.   Specialty: Hematology Why: Be there at 3:15 pm for 3:30 appointment Contact information: [place] [address] [phone]         [facility] Follow up on 11/19/2021.   Specialty: Oncology Why: be there at 1:50 for labwork at 2 pm Contact information: [address] [phone] [place]                Signed: Bary Leriche 11/06/2021, 10:09 AM

## 2021-11-06 NOTE — Plan of Care (Signed)
Dropped off extra supplies for home. Placed trach booklet in binder. Provided splint gauze, inner cannula, trach, ambu-bag, suction kit, ties, and pt reports that sister has suction set up. All items in box and belonging bag

## 2021-11-06 NOTE — Progress Notes (Signed)
Sister of Mr, Jefferys in today for a review on suctioning and final questions before discharge.  Reviewed portable suction machine and suctioning technique with Legretta.  Made sure ambu bag and its importance was reviewed again along with the obturator for the trach. Did given [place] information and phone number to the trach clinic.

## 2021-11-06 NOTE — Progress Notes (Signed)
Inpatient Rehabilitation Care Coordinator Discharge Note   Patient Details  Name: Nathaniel Hicks MRN: [id number] Date of Birth: 1940-07-20   Discharge location: D/c to home  Length of Stay: 8 days  Discharge activity level: ambulatory level Modified Independent with [facility] for household mobility, supervision with [facility] for community mobility  Home/community participation: Limited  Patient response VX:YIAXKP Literacy - How often do you need to have someone help you when you read instructions, pamphlets, or other written material from your doctor or pharmacy?: Never  Patient response VV:ZSMOLM Isolation - How often do you feel lonely or isolated from those around you?: Never  Services provided included: MD, RD, OT, TR, Pharmacy, Neuropsych, SW, CM, RN, SLP, PT  Financial Services:  Financial Services Utilized: Medicaid UHC Medicare  Choices offered to/list presented to: yes  Follow-up services arranged:  Outpatient, DME, [place] Agency: Unable to get [facility] to accept referral.  Outpatient Servicies: [facility] for outpatient PT/OT/SLP DME : [facility] for trach care kits and supplies, portable suction, humidification, and shower chair with back    Patient response to transportation need: Is the patient able to respond to transportation needs?: Yes In the past 12 months, has lack of transportation kept you from medical appointments or from getting medications?: No In the past 12 months, has lack of transportation kept you from meetings, work, or from getting things needed for daily living?: No    Comments (or additional information):PCS referral submitted to [facility]. Pt not eligible for private duty nursing due to no 24/7 caregiver.    Patient/Family verbalized understanding of follow-up arrangements:  Yes  Individual responsible for coordination of the follow-up plan: contact pt or pt sister Lagretta  Confirmed correct DME delivered: Rana Snare  11/06/2021    Rana Snare

## 2021-11-06 NOTE — Telephone Encounter (Signed)
Pharmacy received prescription for Nexium and needing to confirm given two requests are on file.

## 2021-11-06 NOTE — Progress Notes (Signed)
Inpatient Rehabilitation Discharge Medication Review by a Pharmacist  A complete drug regimen review was completed for this patient to identify any potential clinically significant medication issues.  High Risk Drug Classes Is patient taking? Indication by Medication  Antipsychotic No   Anticoagulant No   Antibiotic No   Opioid No   Antiplatelet No   Hypoglycemics/insulin No   Vasoactive Medication Yes Hypertension: Amlodipine, carvedilol, hydralazine  Chemotherapy No   Other Yes Hyperlipidemia: Pravastatin GERD: Nexium Constipation: Miralax BPH: Rapaflo Vitamins/supplements: B-12, D3 Pain: Voltaren gel prn, biofreeze prn     Type of Medication Issue Identified Description of Issue Recommendation(s)  Drug Interaction(s) (clinically significant)     Duplicate Therapy     Allergy     No Medication Administration End Date     Incorrect Dose     Additional Drug Therapy Needed     Significant med changes from prior encounter (inform family/care partners about these prior to discharge).    Other       Clinically significant medication issues were identified that warrant physician communication and completion of prescribed/recommended actions by midnight of the next day:  No   Time spent performing this drug regimen review (minutes):  20   Thank you for allowing [place] to participate in this patients care. Jens Som, PharmD 11/06/2021 8:57 AM  **Pharmacist phone directory can be found on [email or web address] listed under [place]**

## 2021-11-06 NOTE — Progress Notes (Signed)
Subjective: I was asked to see the patient prior to discharge from inpatient rehab.  He reports feeling good with some tightness through the mouth and neck.  He is swallowing well.  Objective: Vital signs in last 24 hours: Temp:  [97.5 F (36.4 C)-98.1 F (36.7 C)] 97.8 F (36.6 C) (10/26 0542) Pulse Rate:  [74-102] 97 (10/26 0753) Resp:  [15-18] 18 (10/26 0753) BP: (122-142)/(63-67) 124/63 (10/26 0542) SpO2:  [96 %-100 %] 100 % (10/26 0753) FiO2 (%):  [21 %] 21 % (10/25 2337) Wt Readings from Last 1 Encounters:  10/28/21 76.4 kg    Intake/Output from previous day: 10/25 0701 - 10/26 0700 In: 472 [P.O.:472] Out: 1100 [Urine:1100] Intake/Output this shift: Total I/O In: 118 [P.O.:118] Out: -   General appearance: alert, cooperative, and no distress Throat: improved speech understanding, flap in floor of mouth with patches of necrosis including at edges but also patches of pink tissue Neck: incision clean and intact, cuffless trach in place with Passy-Muir valve  No results for input(s): "WBC", "HGB", "HCT", "PLT" in the last 72 hours.  No results for input(s): "NA", "K", "CL", "CO2", "GLUCOSE", "BUN", "CREATININE", "CALCIUM" in the last 72 hours.  Medications: I have reviewed the patient's current medications.  Assessment/Plan: Floor of mouth cancer  He is doing great with functioning swallow.  The flap under the tongue is gradually declaring itself in terms of degree of necrosis versus healing.  I plan to reassess the flap as an outpatient and start gradually debriding edges in the office.  I plan to leave the tracheostomy tube in place until it is clear that he does not need further management in the operating room.  The tracheostomy tube will be decannulated as an outpatient.   LOS: 9 days   Melida Quitter 11/06/2021, 8:07 AM

## 2021-11-06 NOTE — Progress Notes (Signed)
INPATIENT REHABILITATION DISCHARGE NOTE   Discharge instructions by: pam PA-C  Verbalized understanding: yes  Skin care/Wound care healing? none  Pain:0/10  IV's: removed  Tubes/Drains: none   O2: room air trach   Safety instructions: given to family   Patient belongings: sent with sister  Discharged to: home   Discharged via: wheelchair  Notes:

## 2021-11-07 DIAGNOSIS — Z930 Tracheostomy status: Secondary | ICD-10-CM | POA: Diagnosis not present

## 2021-11-10 DIAGNOSIS — Z930 Tracheostomy status: Secondary | ICD-10-CM | POA: Diagnosis not present

## 2021-11-13 DIAGNOSIS — M545 Low back pain, unspecified: Secondary | ICD-10-CM | POA: Diagnosis not present

## 2021-11-13 DIAGNOSIS — C069 Malignant neoplasm of mouth, unspecified: Secondary | ICD-10-CM | POA: Diagnosis not present

## 2021-11-18 ENCOUNTER — Inpatient Hospital Stay: Payer: Medicare Other | Attending: Hematology

## 2021-11-18 DIAGNOSIS — Z809 Family history of malignant neoplasm, unspecified: Secondary | ICD-10-CM | POA: Insufficient documentation

## 2021-11-18 DIAGNOSIS — Z8547 Personal history of malignant neoplasm of testis: Secondary | ICD-10-CM | POA: Diagnosis not present

## 2021-11-18 DIAGNOSIS — C049 Malignant neoplasm of floor of mouth, unspecified: Secondary | ICD-10-CM | POA: Diagnosis not present

## 2021-11-18 DIAGNOSIS — Z87891 Personal history of nicotine dependence: Secondary | ICD-10-CM | POA: Insufficient documentation

## 2021-11-18 DIAGNOSIS — I10 Essential (primary) hypertension: Secondary | ICD-10-CM | POA: Insufficient documentation

## 2021-11-18 DIAGNOSIS — C6211 Malignant neoplasm of descended right testis: Secondary | ICD-10-CM

## 2021-11-18 LAB — COMPREHENSIVE METABOLIC PANEL
ALT: 21 U/L (ref 0–44)
AST: 17 U/L (ref 15–41)
Albumin: 3.2 g/dL — ABNORMAL LOW (ref 3.5–5.0)
Alkaline Phosphatase: 57 U/L (ref 38–126)
Anion gap: 8 (ref 5–15)
BUN: 14 mg/dL (ref 8–23)
CO2: 25 mmol/L (ref 22–32)
Calcium: 9.9 mg/dL (ref 8.9–10.3)
Chloride: 104 mmol/L (ref 98–111)
Creatinine, Ser: 0.83 mg/dL (ref 0.61–1.24)
GFR, Estimated: >60 mL/min (ref >60)
Glucose, Bld: 91 mg/dL (ref 70–99)
Potassium: 4.2 mmol/L (ref 3.5–5.1)
Sodium: 137 mmol/L (ref 135–145)
Total Bilirubin: 0.3 mg/dL (ref 0.3–1.2)
Total Protein: 7.6 g/dL (ref 6.5–8.1)

## 2021-11-18 LAB — CBC WITH DIFFERENTIAL/PLATELET
Abs Immature Granulocytes: 0.01 10*3/uL (ref 0.00–0.07)
Basophils Absolute: 0.1 10*3/uL (ref 0.0–0.1)
Basophils Relative: 1 %
Eosinophils Absolute: 0.2 10*3/uL (ref 0.0–0.5)
Eosinophils Relative: 3 %
HCT: 35.1 % — ABNORMAL LOW (ref 39.0–52.0)
Hemoglobin: 11.3 g/dL — ABNORMAL LOW (ref 13.0–17.0)
Immature Granulocytes: 0 %
Lymphocytes Relative: 25 %
Lymphs Abs: 1.6 10*3/uL (ref 0.7–4.0)
MCH: 31.8 pg (ref 26.0–34.0)
MCHC: 32.2 g/dL (ref 30.0–36.0)
MCV: 98.9 fL (ref 80.0–100.0)
Monocytes Absolute: 1 10*3/uL (ref 0.1–1.0)
Monocytes Relative: 14 %
Neutro Abs: 3.7 10*3/uL (ref 1.7–7.7)
Neutrophils Relative %: 57 %
Platelets: 289 10*3/uL (ref 150–400)
RBC: 3.55 MIL/uL — ABNORMAL LOW (ref 4.22–5.81)
RDW: 13.4 % (ref 11.5–15.5)
WBC: 6.6 10*3/uL (ref 4.0–10.5)
nRBC: 0 % (ref 0.0–0.2)

## 2021-11-19 LAB — AFP TUMOR MARKER: AFP, Serum, Tumor Marker: <1.8 ng/mL (ref 0.0–6.4)

## 2021-11-19 LAB — BETA HCG QUANT (REF LAB): hCG Quant: <1 m[IU]/mL (ref 0–3)

## 2021-11-20 ENCOUNTER — Inpatient Hospital Stay (HOSPITAL_COMMUNITY): Admission: RE | Admit: 2021-11-20 | Payer: Medicare Other | Source: Ambulatory Visit

## 2021-11-21 DIAGNOSIS — R7301 Impaired fasting glucose: Secondary | ICD-10-CM | POA: Diagnosis not present

## 2021-11-21 DIAGNOSIS — E559 Vitamin D deficiency, unspecified: Secondary | ICD-10-CM | POA: Diagnosis not present

## 2021-11-21 DIAGNOSIS — I10 Essential (primary) hypertension: Secondary | ICD-10-CM | POA: Diagnosis not present

## 2021-11-21 DIAGNOSIS — E782 Mixed hyperlipidemia: Secondary | ICD-10-CM | POA: Diagnosis not present

## 2021-11-23 DIAGNOSIS — R531 Weakness: Secondary | ICD-10-CM | POA: Diagnosis not present

## 2021-11-25 ENCOUNTER — Other Ambulatory Visit: Payer: Self-pay | Admitting: Acute Care

## 2021-11-25 ENCOUNTER — Inpatient Hospital Stay (HOSPITAL_BASED_OUTPATIENT_CLINIC_OR_DEPARTMENT_OTHER): Payer: Medicare Other | Admitting: Hematology

## 2021-11-25 ENCOUNTER — Ambulatory Visit (HOSPITAL_COMMUNITY)
Admission: RE | Admit: 2021-11-25 | Discharge: 2021-11-25 | Disposition: A | Discharge status: Home / Self Care | Payer: Medicare Other | Source: Ambulatory Visit | Attending: Acute Care | Admitting: Acute Care

## 2021-11-25 VITALS — BP 158/70 | HR 87 | Temp 97.9°F | Resp 18 | Ht 69.49 in | Wt 170.2 lb

## 2021-11-25 DIAGNOSIS — I10 Essential (primary) hypertension: Secondary | ICD-10-CM | POA: Diagnosis not present

## 2021-11-25 DIAGNOSIS — Z930 Tracheostomy status: Secondary | ICD-10-CM

## 2021-11-25 DIAGNOSIS — Z809 Family history of malignant neoplasm, unspecified: Secondary | ICD-10-CM | POA: Diagnosis not present

## 2021-11-25 DIAGNOSIS — Z85819 Personal history of malignant neoplasm of unspecified site of lip, oral cavity, and pharynx: Secondary | ICD-10-CM | POA: Diagnosis present

## 2021-11-25 DIAGNOSIS — Z87891 Personal history of nicotine dependence: Secondary | ICD-10-CM | POA: Diagnosis not present

## 2021-11-25 DIAGNOSIS — J0410 Acute tracheitis without obstruction: Secondary | ICD-10-CM | POA: Diagnosis present

## 2021-11-25 DIAGNOSIS — Z8547 Personal history of malignant neoplasm of testis: Secondary | ICD-10-CM | POA: Diagnosis not present

## 2021-11-25 DIAGNOSIS — C049 Malignant neoplasm of floor of mouth, unspecified: Secondary | ICD-10-CM | POA: Diagnosis present

## 2021-11-25 DIAGNOSIS — C069 Malignant neoplasm of mouth, unspecified: Secondary | ICD-10-CM

## 2021-11-25 MED ORDER — CEPHALEXIN 125 MG/5ML PO SUSR: 250.0000 mg | Freq: Four times a day (QID) | ORAL | 0 refills | Status: AC

## 2021-11-25 NOTE — Progress Notes (Signed)
Reason for visit To get established at trach clinic   HPI 81 year old male who underwent radical neck dissection w/ floor of mouth resection, dental extraction and trach all in setting of cancer on floor of mouth.He is being followed by ENT w/ his last note by Dr Redmond Baseman who notes last reviewed on 10/26 comment on how well he was doing w/ PO intake and that he would need to follow the pt in his office for debridement of the flap on the bottom of his mouth and that he plans to keep the trach in place until it is clear he will no longer need surgical intervention. 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 Authorizing Provider  amLODipine (NORVASC) 10 MG tablet Take 1 tablet (10 mg total) by mouth daily. 11/06/21   Love, Ivan Anchors, PA-C  carvedilol (COREG) 6.25 MG tablet Take 1 tablet (6.25 mg total) by mouth 2 (two) times daily with a meal. 11/06/21   Love, Ivan Anchors, PA-C  Cholecalciferol (VITAMIN D3) 50 MCG (2000 UT) TABS Take 2,000 Units by mouth daily.    [provider]  diclofenac Sodium (VOLTAREN) 1 % GEL Apply 1 g topically daily as  needed (Arthritis pain).    [provider]  esomeprazole (NEXIUM) 10 MG packet Take 10 mg by mouth 2 (two) times daily. 11/06/21   Love, Ivan Anchors, PA-C  hydrALAZINE (APRESOLINE) 100 MG tablet Take 1 tablet (100 mg total) by mouth every 8 (eight) hours. 11/05/21   Love, Ivan Anchors, PA-C  Menthol, Topical Analgesic, (BIOFREEZE EX) Apply 1 application  topically daily as needed (pain).    [provider]  Mouthwashes (MOUTH RINSE) LIQD solution 15 mLs by Mouth Rinse route 4 (four) times daily - after meals and at bedtime. 11/05/21   Love, Ivan Anchors, PA-C  polyethylene glycol powder (GLYCOLAX/MIRALAX) 17 GM/SCOOP powder Take 1 capfull (17g) by mouth 2 (two) times daily. 11/05/21   Love, Ivan Anchors, PA-C  pravastatin (PRAVACHOL) 80 MG tablet Take 80 mg by mouth daily.    [provider]  RAPAFLO 8 MG CAPS capsule Take 8 mg by mouth daily. Silodosin 10/13/13   [provider]  vitamin B-12 (CYANOCOBALAMIN) 500 MCG tablet Take 500 mcg by mouth daily.    [provider]    Review of Systems  Constitutional:  Negative for fever and malaise/fatigue.  HENT:  Positive for sore throat.   Eyes: Negative.  Respiratory: Negative.    Cardiovascular: Negative.   Gastrointestinal: Negative.   Genitourinary: Negative.   Musculoskeletal: Negative.   Skin: Negative.     Exam General 81 year old male NAD HENT NCAT no JVD trach midline red edematous granular tissue surrounding the trach stoma w/ think yellow secretions Pulm clear Card rrr Abd soft Ext warm and dry   Procedure The trach was removed. Site inspected and as noted above. New # 6 cuffless trach inserted w/out difficulty. 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 Note he does have pretty significant inflammatory tissue surrounding the trach stoma and this could be contributing to the drainage Plan Will empirically treat w/ 7 d course of keflex    Tracheostomy in place [facility]) Assessment & Plan: Currently has # 6 cuffless trach Last change 11/14  Discussion Stable trach. Will not assess for decannulation until cleared by ENT  Plan ROV 8 weeks for trach change Cont routine trach care Placing DME orders Will defer to ENT re: timing of decannulation. Happy to do it OR have Dr Redmond Baseman which ever he prefers      My time 63 min Erick Colace ACNP-BC [place] Pager # [phone] OR # [phone] if no answer

## 2021-11-25 NOTE — Progress Notes (Signed)
Tracheostomy Procedure Note  Nathaniel Hicks [id number] December 25, 1940  Pre Procedure Tracheostomy Information  Trach Brand: Shiley Size:  6.0 [id number] Style: Uncuffed Secured by: Velcro   Procedure: Trach Change and Trach Cleaning    Post Procedure Tracheostomy Information  Trach Brand: Shiley Size:  6.0 [id number] Style: Uncuffed Secured by: Velcro   Post Procedure Evaluation:  ETCO2 positive color change from yellow to purple : Yes.   Vital signs:VSS Patients current condition: stable Complications: No apparent complications Trach site exam: clean, dry darin Wound care done: dry and 4 x 4 gauze Patient did tolerate procedure well.   Education: none  Prescription needs: none    Additional needs: New PMV given to patient today, an additional trach [phone] and one box of inner cannulas.

## 2021-11-25 NOTE — Assessment & Plan Note (Signed)
He has sig thick yellow drainage at trach site raising concern for trach site infection. Note he does have pretty significant inflammatory tissue surrounding the trach stoma and this could be contributing to the drainage Plan Will empirically treat w/ 7 d course of keflex

## 2021-11-25 NOTE — Patient Instructions (Addendum)
[place]  Discharge Instructions  You were seen and examined today by Dr. Delton Coombes.  Dr. Delton Coombes discussed your most recent lab work and your new diagnosis of oral cancer.   Given the new diagnosis, Dr. Delton Coombes has recommended a PET scan. A PET scan is a whole body CT scan that illuminates where there is cancer present in the body.  Follow-up with Dr. Redmond Baseman as scheduled tomorrow, Dr. Delton Coombes and Dr. Redmond Baseman will collaborate to devise the best course of treatment for your new oral cancer.  Follow-up as scheduled.   Thank you for choosing [facility] to provide your oncology and hematology care.   To afford each patient quality time with our provider, please arrive at least 15 minutes before your scheduled appointment time. You may need to reschedule your appointment if you arrive late (10 or more minutes). Arriving late affects you and other patients whose appointments are after yours.  Also, if you miss three or more appointments without notifying the office, you may be dismissed from the clinic at the provider's discretion.    Again, thank you for choosing [facility].  Our hope is that these requests will decrease the amount of time that you wait before being seen by our physicians.   If you have a lab appointment with the [facility] please come in thru the Main Entrance and check in at the main information desk.           _____________________________________________________________  Should you have questions after your visit to [facility], please contact our office at [phone] and follow the prompts.  Our office hours are 8:00 a.m. to 4:30 p.m. Monday - Thursday and 8:00 a.m. to 2:30 p.m. Friday.  Please note that voicemails left after 4:00 p.m. may not be returned until the following business day.  We are closed weekends and all major holidays.  You do have access to a nurse 24-7, just  call the main number to the clinic [phone] and do not press any options, hold on the line and a nurse will answer the phone.    For prescription refill requests, have your pharmacy contact our office and allow 72 hours.    Masks are optional in the cancer centers. If you would like for your care team to wear a mask while they are taking care of you, please let them know. You may have one support person who is at least 81 years old accompany you for your appointments.

## 2021-11-25 NOTE — Progress Notes (Signed)
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 male is seen in consultation today at the request of Dr. Redmond Baseman for further work-up and management of floor of the mouth squamous cell carcinoma.  He had an abnormal CT soft tissue of the neck on 07/14/2021 which was done for a neck mass.  He underwent floor of the mouth biopsy on 07/18/2021 which showed invasive well to moderately differentiated keratinizing squamous cell carcinoma.  P16 was negative.  He underwent floor of the mouth resection, tracheostomy, bilateral selective neck dissection zones 1-3 on 10/14/2021.  He has recovered well from surgery.  He is seen today with his 2 sisters.  He is eating pured food.  He lives by himself and is independent of ADLs and IADLs although he does not drive.  His energy levels are slowly coming back to normal.  He does not report any weight loss prior to surgery.  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   Vitamin D deficiency 11/02/2017    SURGICAL HISTORY: Past Surgical History:  Procedure Laterality Date   COLONOSCOPY N/A 11/24/2012   Procedure: COLONOSCOPY;  Surgeon: Rogene Houston, MD;  [facility];  Service: Endoscopy;  Laterality: N/A;  830-moved to [place] notified pt   FLOOR OF MOUTH BIOPSY N/A 10/14/2021   Procedure: FLOOR OF MOUTH RESECTION;  Surgeon:  Melida Quitter, MD;  Location: [facility];  Service: ENT;  Laterality: N/A;   HEMORROIDECTOMY     KNEE ARTHROSCOPY WITH LATERAL MENISECTOMY Right 08/31/2017   Procedure: KNEE ARTHROSCOPY WITH LATERAL MENISECTOMY;  Surgeon: Carole Civil, MD;  Location: [facility];  Service: Orthopedics;  Laterality: Right;   LESION EXCISION N/A 03/23/2012   Procedure: EXCISION NEOPLASM SCALP ;  Surgeon: Jamesetta So, MD;  Location: [facility];  Service: General;  Laterality: N/A;  Excision of Scalp Neoplasm   ORCHIECTOMY Right 12/20/2012   Procedure: RIGHT RADICAL ORCHIECTOMY/POSSIBLE BX RIGHT TESTICLE;  Surgeon: Marissa Nestle, MD;  Location: [facility];  Service: Urology;  Laterality: Right;   PROSTATE SURGERY     RADICAL NECK DISSECTION Bilateral 10/14/2021   Procedure: NECK DISSECTION;  Surgeon: Melida Quitter, MD;  Location: [facility];  Service: ENT;  Laterality: Bilateral;   SCALP LACERATION REPAIR     [facility]Dr Tamala Julian   SKIN FULL THICKNESS GRAFT Bilateral 10/14/2021   Procedure: PLATYSMA FLAP CLOSURE;  Surgeon: Melida Quitter, MD;  Location: [facility];  Service: ENT;  Laterality: Bilateral;   TOOTH EXTRACTION  10/14/2021   Procedure: DENTAL EXTRACTIONS;  Surgeon: Melida Quitter, MD;  Location: [facility];  Service: ENT;;   TRACHEOSTOMY TUBE PLACEMENT N/A 10/14/2021   Procedure: TRACHEOSTOMY;  Surgeon: Melida Quitter, MD;  Location: [facility];  Service: ENT;  Laterality: N/A;    SOCIAL HISTORY: Social History   Socioeconomic History   Marital status: Single    Spouse name: Not  on file   Number of children: Not on file   Years of education: Not on file   Highest education level: Not on file  Occupational History   Not on file  Tobacco Use   Smoking status: Former    Packs/day: 0.25    Years: 50.00    Total pack years: 12.50    Types: Cigarettes   Smokeless tobacco: Never  Vaping Use   Vaping Use: Never used  Substance and Sexual Activity   Alcohol use: Not Currently   Drug use: No   Sexual activity: Yes    Birth  control/protection: None  Other Topics Concern   Not on file  Social History Narrative   Not on file   Social Determinants of Health   Financial Resource Strain: Not on file  Food Insecurity: Not on file  Transportation Needs: Not on file  Physical Activity: Not on file  Stress: Not on file  Social Connections: Not on file  Intimate Partner Violence: Not on file    FAMILY HISTORY: Family History  Problem Relation Age of Onset   Cancer Brother    Cancer Brother     ALLERGIES:  has No Known Allergies.  MEDICATIONS:  Current Outpatient Medications  Medication Sig Dispense Refill   amLODipine (NORVASC) 10 MG tablet Take 1 tablet (10 mg total) by mouth daily. 300 tablet 0   carvedilol (COREG) 6.25 MG tablet Take 1 tablet (6.25 mg total) by mouth 2 (two) times daily with a meal. 60 tablet 0   cephALEXin (KEFLEX) 125 MG/5ML suspension Take 10 mLs (250 mg total) by mouth 4 (four) times daily for 7 days. 280 mL 0   Cholecalciferol (VITAMIN D3) 50 MCG (2000 UT) TABS Take 2,000 Units by mouth daily.     diclofenac Sodium (VOLTAREN) 1 % GEL Apply 1 g topically daily as needed (Arthritis pain).     esomeprazole (NEXIUM) 10 MG packet Take 10 mg by mouth 2 (two) times daily. 60 each 0   hydrALAZINE (APRESOLINE) 100 MG tablet Take 1 tablet (100 mg total) by mouth every 8 (eight) hours. 90 tablet 0   Menthol, Topical Analgesic, (BIOFREEZE EX) Apply 1 application  topically daily as needed (pain).     Mouthwashes (MOUTH RINSE) LIQD solution 15 mLs by Mouth Rinse route 4 (four) times daily - after meals and at bedtime.  0   oxyCODONE (OXY IR/ROXICODONE) 5 MG immediate release tablet Take 5 mg by mouth 3 (three) times daily.     polyethylene glycol powder (GLYCOLAX/MIRALAX) 17 GM/SCOOP powder Take 1 capfull (17g) by mouth 2 (two) times daily. 238 g 0   pravastatin (PRAVACHOL) 80 MG tablet Take 80 mg by mouth daily.     RAPAFLO 8 MG CAPS capsule Take 8 mg by mouth daily. Silodosin     vitamin  B-12 (CYANOCOBALAMIN) 500 MCG tablet Take 500 mcg by mouth daily.     No current facility-administered medications for this visit.    REVIEW OF SYSTEMS:   Constitutional: Denies fevers, chills or abnormal night sweats Eyes: Denies blurriness of vision, double vision or watery eyes Ears, nose, mouth, throat, and face: Denies mucositis or sore throat Respiratory: Denies cough, dyspnea or wheezes Cardiovascular: Denies palpitation, chest discomfort or lower extremity swelling Gastrointestinal:  Denies nausea, heartburn or change in bowel habits Skin: Denies abnormal skin rashes Lymphatics: Denies new lymphadenopathy or easy bruising Neurological:Denies numbness, tingling or new weaknesses Behavioral/Psych: Mood is stable, no new changes  All other systems were reviewed  with the patient and are negative.  PHYSICAL EXAMINATION: ECOG PERFORMANCE STATUS: 1 - Symptomatic but completely ambulatory  Vitals:   11/25/21 1515  BP: (!) 158/70  Pulse: 87  Resp: 18  Temp: 97.9 F (36.6 C)  SpO2: 100%   Filed Weights   11/25/21 1515  Weight: 170 lb 3.2 oz (77.2 kg)    GENERAL:alert, no distress and comfortable SKIN: skin color, texture, turgor are normal, no rashes or significant lesions EYES: normal, conjunctiva are pink and non-injected, sclera clear OROPHARYNX:no exudate, no erythema and lips, buccal mucosa, and tongue normal  NECK: supple, thyroid normal size, non-tender, without nodularity.  Tracheostomy in place. LYMPH:  no palpable lymphadenopathy in the cervical, axillary or inguinal LUNGS: clear to auscultation and percussion with normal breathing effort HEART: regular rate & rhythm and no murmurs and no lower extremity edema ABDOMEN:abdomen soft, non-tender and normal bowel sounds Musculoskeletal:no cyanosis of digits and no clubbing  PSYCH: alert & oriented x 3 with fluent speech NEURO: no focal motor/sensory deficits  LABORATORY DATA:  I have reviewed the data as  listed Lab Results  Component Value Date   WBC 6.6 11/18/2021   HGB 11.3 (L) 11/18/2021   HCT 35.1 (L) 11/18/2021   MCV 98.9 11/18/2021   PLT 289 11/18/2021     Chemistry      Component Value Date/Time   NA 137 11/18/2021 1413   K 4.2 11/18/2021 1413   CL 104 11/18/2021 1413   CO2 25 11/18/2021 1413   BUN 14 11/18/2021 1413   CREATININE 0.83 11/18/2021 1413      Component Value Date/Time   CALCIUM 9.9 11/18/2021 1413   ALKPHOS 57 11/18/2021 1413   AST 17 11/18/2021 1413   ALT 21 11/18/2021 1413   BILITOT 0.3 11/18/2021 1413       RADIOGRAPHIC STUDIES: I have personally reviewed the radiological images as listed and agreed with the findings in the report. No results found.  ASSESSMENT:  1.  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  Enlarged contralateral right submandibular node.  Enlargement of the left submandibular gland probably reactive.  No abscess. - Biopsy (07/18/2021) floor of the mouth: Invasive well to moderately differentiated keratinizing squamous cell carcinoma.  Tumor cells negative for p16. - 10/14/2021: Floor of the mouth resection, tracheostomy, bilateral selective neck dissections zones 1-3 by Dr. Redmond Baseman - Pathology: Invasive moderately differentiated keratinizing SCC, 4.1 cm, carcinoma invades for a depth of about 1.9 cm and involves saliva gland tissue.  Anterior, posterior, right, left resection margins are involved.  Deep resection margin is negative.  Metastatic carcinoma 1/3 level 1 lymph nodes.  3 level 2 and 3 level 3 lymph nodes negative for carcinoma.  0/10 lymph nodes involved in the left side neck zone 1, 2, 3 dissection.  ENE not identified.  LVI/perineural invasion not identified. - No weight loss prior to surgery.  No  tingling or numbness in extremities.  2.  Social/family history: - He lives in his apartment by himself and is independent of ADLs and IADLs.  He does not drive.  He worked as a Dealer and several other jobs.  Quit smoking 1 month ago.  He smoked 1 pack/week for more than 50 years. - 1 brother had agent orange related cancer.  Another brother  also had cancer, type unknown to the patient.  3.  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 - He was on close surveillance rather than adjuvant chemotherapy.  PLAN:  1.  Stage IVa (PT4PN1) SCC of the floor of the mouth, p16 negative: - We have reviewed pathology reports in detail. - He has couple of adverse pathological features including T4 tumor and positive margins. - He will follow-up with Dr. Redmond Baseman tomorrow. - If reresection not feasible, chemoradiation therapy with weekly cisplatin is an option. - If reresection is feasible, consider RT after negative margins. - I have recommended PET scan for staging. - RTC after PET scan.  2.  Stage Ib pure seminoma: - He does not have any clinical signs or symptoms of recurrence.  Tumor markers are within normal limits.   Orders Placed This Encounter  Procedures   NM PET Image Initial (PI) Skull Base To Thigh    Standing Status:   Future    Standing Expiration Date:   11/25/2022    Order Specific Question:   If indicated for the ordered procedure, I authorize the administration of a radiopharmaceutical per Radiology protocol    Answer:   Yes    Order Specific Question:   Preferred imaging location?    Answer:   [facility]    Order Specific Question:   Release to patient    Answer:   Immediate    All questions were answered. The patient knows to call the clinic with any problems, questions or concerns.      [name], MD 11/25/2021 4:13 PM

## 2021-11-25 NOTE — Assessment & Plan Note (Signed)
Currently has # 6 cuffless trach Last change 11/14  Discussion Stable trach. Will not assess for decannulation until cleared by ENT  Plan ROV 8 weeks for trach change Cont routine trach care Placing DME orders Will defer to ENT re: timing of decannulation. Happy to do it OR have Dr Redmond Baseman which ever he prefers

## 2021-11-29 DIAGNOSIS — I10 Essential (primary) hypertension: Secondary | ICD-10-CM | POA: Diagnosis not present

## 2021-11-29 DIAGNOSIS — R7301 Impaired fasting glucose: Secondary | ICD-10-CM | POA: Diagnosis not present

## 2021-11-29 DIAGNOSIS — D1730 Benign lipomatous neoplasm of skin and subcutaneous tissue of unspecified sites: Secondary | ICD-10-CM | POA: Diagnosis not present

## 2021-11-29 DIAGNOSIS — E782 Mixed hyperlipidemia: Secondary | ICD-10-CM | POA: Diagnosis not present

## 2021-11-29 DIAGNOSIS — G629 Polyneuropathy, unspecified: Secondary | ICD-10-CM | POA: Diagnosis not present

## 2021-11-29 DIAGNOSIS — D539 Nutritional anemia, unspecified: Secondary | ICD-10-CM | POA: Diagnosis not present

## 2021-11-29 DIAGNOSIS — R252 Cramp and spasm: Secondary | ICD-10-CM | POA: Diagnosis not present

## 2021-11-29 DIAGNOSIS — M1990 Unspecified osteoarthritis, unspecified site: Secondary | ICD-10-CM | POA: Diagnosis not present

## 2021-11-29 DIAGNOSIS — E559 Vitamin D deficiency, unspecified: Secondary | ICD-10-CM | POA: Diagnosis not present

## 2021-11-29 DIAGNOSIS — M545 Low back pain, unspecified: Secondary | ICD-10-CM | POA: Diagnosis not present

## 2021-12-11 ENCOUNTER — Encounter (HOSPITAL_COMMUNITY)
Admission: RE | Admit: 2021-12-11 | Discharge: 2021-12-11 | Disposition: A | Discharge status: Home / Self Care | Payer: Medicare Other | Source: Ambulatory Visit | Attending: Hematology | Admitting: Hematology

## 2021-12-11 DIAGNOSIS — C069 Malignant neoplasm of mouth, unspecified: Secondary | ICD-10-CM | POA: Insufficient documentation

## 2021-12-12 DIAGNOSIS — Z930 Tracheostomy status: Secondary | ICD-10-CM | POA: Diagnosis not present

## 2021-12-12 MED ORDER — FLUDEOXYGLUCOSE F - 18 (FDG) INJECTION
9.9900 | Freq: Once | INTRAVENOUS | Status: AC | PRN
  Administered 2021-12-11: 9.99 via INTRAVENOUS

## 2021-12-16 ENCOUNTER — Inpatient Hospital Stay: Payer: Medicare Other | Attending: Hematology | Admitting: Hematology

## 2021-12-16 VITALS — BP 136/62 | HR 83 | Temp 97.0°F | Resp 17 | Ht 71.0 in | Wt 172.0 lb

## 2021-12-16 DIAGNOSIS — K769 Liver disease, unspecified: Secondary | ICD-10-CM | POA: Diagnosis not present

## 2021-12-16 DIAGNOSIS — C6211 Malignant neoplasm of descended right testis: Secondary | ICD-10-CM | POA: Diagnosis not present

## 2021-12-16 DIAGNOSIS — I10 Essential (primary) hypertension: Secondary | ICD-10-CM | POA: Insufficient documentation

## 2021-12-16 DIAGNOSIS — Z87891 Personal history of nicotine dependence: Secondary | ICD-10-CM | POA: Diagnosis not present

## 2021-12-16 DIAGNOSIS — C049 Malignant neoplasm of floor of mouth, unspecified: Secondary | ICD-10-CM | POA: Insufficient documentation

## 2021-12-16 DIAGNOSIS — Z8547 Personal history of malignant neoplasm of testis: Secondary | ICD-10-CM | POA: Diagnosis not present

## 2021-12-16 NOTE — Patient Instructions (Signed)
[place]  Discharge Instructions  You were seen and examined today by Dr. Delton Coombes.  Dr. Delton Coombes has reviewed your recent PET scan. The cancer has spread to one lymph node it appears. You will likely need radiation without chemotherapy.  There is a questionable area on your liver which we would like to evaluate further with an MRI of your abdomen. There was also some activity in your testicle, which we would like to check with an Ultrasound.  You can meet with a Radiation Oncologist to discuss the role of Radiation Therapy.  Follow-up as scheduled.  Thank you for choosing [facility] to provide your oncology and hematology care.   To afford each patient quality time with our provider, please arrive at least 15 minutes before your scheduled appointment time. You may need to reschedule your appointment if you arrive late (10 or more minutes). Arriving late affects you and other patients whose appointments are after yours.  Also, if you miss three or more appointments without notifying the office, you may be dismissed from the clinic at the provider's discretion.    Again, thank you for choosing [facility].  Our hope is that these requests will decrease the amount of time that you wait before being seen by our physicians.   If you have a lab appointment with the [facility] please come in thru the Main Entrance and check in at the main information desk.           _____________________________________________________________  Should you have questions after your visit to [facility], please contact our office at [phone] and follow the prompts.  Our office hours are 8:00 a.m. to 4:30 p.m. Monday - Thursday and 8:00 a.m. to 2:30 p.m. Friday.  Please note that voicemails left after 4:00 p.m. may not be returned until the following business day.  We are closed weekends and all major holidays.  You do  have access to a nurse 24-7, just call the main number to the clinic [phone] and do not press any options, hold on the line and a nurse will answer the phone.    For prescription refill requests, have your pharmacy contact our office and allow 72 hours.    Masks are optional in the cancer centers. If you would like for your care team to wear a mask while they are taking care of you, please let them know. You may have one support person who is at least 81 years old accompany you for your appointments.

## 2021-12-16 NOTE — Progress Notes (Signed)
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 Right radical orchiectomy on 12/20/2012 2.  Floor of the mouth resection, bilateral selective neck dissections zones 1-3 on 10/14/2021  NGS Results: Not done  CURRENT THERAPY: Surveillance  BRIEF ONCOLOGIC HISTORY:  Oncology History  Testicle Hicks, right  12/20/2012 Surgery   Right total orchiectomyt- 1. Testis, biopsy, right - SEMINOMA. PLEASE SEE COMMENT. 2. Testis, tumor, right - SEMINOMA, 7.5 CM. - ANGIOLYMPHATIC INVASION PRESENT. - RESECTION MARGINS, NEGATIVE FOR ATYPIA OR MALIGNANCY.   01/18/2013 PET scan   Status post right orchiectomy. No findings specific for metastatic disease. Small para-aortic nodes measuring up to 5 mm short axis, without convincing hypermetabolism. Given location, attention on follow-up is suggested.   04/17/2013 Imaging   CT CAP- No evidence of metastatic disease in the chest, abdomen or pelvis. Proximal LAD coronary artery calcification.   07/20/2013 Imaging   CT abd/pelvis- No evidence of metastatic disease in the abdomen or pelvis.   10/24/2013 Imaging   CT abd/pelvis- No findings to suggest metastatic disease in the abdomen or pelvis   10/24/2013 Imaging   Chest xray- Probable COPD.  Negative for metastatic disease.   02/21/2014 Imaging   CT abd/pelvis- Stable abdominal pelvic CT status post right orchectomy. No evidence of adenopathy or other metastatic disease.   02/21/2014 Imaging   Chest xray- Left lower lobe mild atelectasis and/or infiltrate.     09/10/2014 Imaging   CT abd/pelvis- Status post right orchiectomy.   No evidence of metastatic disease.   3 mm nonobstructing right upper pole renal calculus. No hydronephrosis.    03/14/2015 Imaging   CT abd/pelvis- Stable exam. No evidence of metastatic disease or other acute findings within the abdomen or pelvis.   09/13/2015 Imaging   CT abd/pelvis- No acute findings and no evidence for mass or adenopathy.    04/24/2016 Imaging   CT abd/pelvis: IMPRESSION: 1. Stable exam. No new or progressive findings. No features to suggest metastatic disease.     Hicks STAGING: Hicks Staging  Hicks of floor of mouth (Nathaniel Hicks) Staging form: Oral Cavity, AJCC 8th Edition - Clinical stage from 11/25/2021: Stage IVA (cT4a, cN1, cM0) - Unsigned  Testicle Hicks, right Staging form: Testis, AJCC 7th Edition - Clinical: Stage I (T2, N0, M0) - Signed by Nathaniel Hicks on 07/23/2013    INTERVAL HISTORY:  [name] 81 y.o. male seen for follow-up after recent visit and a PET scan.  He had a tracheostomy discontinued.  He is having some difficulty with swallowing which is also improving.    REVIEW OF SYSTEMS:  Review of Systems  HENT:   Positive for trouble swallowing.   All other systems reviewed and are negative.    PAST MEDICAL/SURGICAL HISTORY:  Past Medical History:  Diagnosis Date   Arthritis    BPH (benign prostatic hyperplasia)    Difficult intubation    HOH (hard of hearing)    Hyperlipidemia 11/02/2017   Hypertension    Hypertension 11/02/2017   Testicular Hicks ([place])    2014   Vitamin D deficiency 11/02/2017   Past Surgical History:  Procedure Laterality Date   COLONOSCOPY N/A 11/24/2012   Procedure: COLONOSCOPY;  Surgeon: Nathaniel Hicks;  [facility];  Service: Endoscopy;  Laterality: N/A;  830-moved to [place] notified pt   FLOOR OF MOUTH BIOPSY N/A 10/14/2021   Procedure: FLOOR OF MOUTH RESECTION;  Surgeon: Nathaniel Quitter, [place];  Location: [facility];  Service: ENT;  Laterality: N/A;   HEMORROIDECTOMY     KNEE ARTHROSCOPY WITH LATERAL MENISECTOMY Right 08/31/2017   Procedure: KNEE ARTHROSCOPY WITH LATERAL MENISECTOMY;  Surgeon:  Nathaniel Civil, [place];  Location: [facility];  Service: Orthopedics;  Laterality: Right;   LESION EXCISION N/A 03/23/2012   Procedure: EXCISION NEOPLASM SCALP ;  Surgeon: Nathaniel Hicks, [place];  Location: [facility];  Service: General;  Laterality: N/A;  Excision of Scalp Neoplasm   ORCHIECTOMY Right 12/20/2012   Procedure: RIGHT RADICAL ORCHIECTOMY/POSSIBLE BX RIGHT TESTICLE;  Surgeon: Nathaniel Nestle, [place];  Location: [facility];  Service: Urology;  Laterality: Right;   PROSTATE SURGERY     RADICAL NECK DISSECTION Bilateral 10/14/2021   Procedure: NECK DISSECTION;  Surgeon: Nathaniel Quitter, [place];  Location: [facility];  Service: ENT;  Laterality: Bilateral;   SCALP LACERATION REPAIR     [facility]-Dr Nathaniel Julian   SKIN FULL THICKNESS GRAFT Bilateral 10/14/2021   Procedure: PLATYSMA FLAP CLOSURE;  Surgeon: Nathaniel Quitter, [place];  Location: [facility];  Service: ENT;  Laterality: Bilateral;   TOOTH EXTRACTION  10/14/2021   Procedure: DENTAL EXTRACTIONS;  Surgeon: Nathaniel Quitter, [place];  Location: [facility];  Service: ENT;;   TRACHEOSTOMY TUBE PLACEMENT N/A 10/14/2021   Procedure: TRACHEOSTOMY;  Surgeon: Nathaniel Quitter, [place];  Location: [facility];  Service: ENT;  Laterality: N/A;     SOCIAL HISTORY:  Social History   Socioeconomic History   Marital status: Single    Spouse name: Not on file   Number of children: Not on file   Years of education: Not on file   Highest education level: Not on file  Occupational History   Not on file  Tobacco Use   Smoking status: Former    Packs/day: 0.25    Years: 50.00    Total pack years: 12.50    Types: Cigarettes   Smokeless tobacco: Never  Vaping Use   Vaping Use: Never used  Substance and Sexual Activity   Alcohol use: Not Currently   Drug use: No   Sexual activity: Yes    Birth control/protection: None  Other Topics Concern   Not on file  Social History Narrative   Not on file   Social Determinants of Health   Financial Resource Strain: Not on file  Food Insecurity: Not on file   Transportation Needs: Not on file  Physical Activity: Not on file  Stress: Not on file  Social Connections: Not on file  Intimate Partner Violence: Not on file    FAMILY HISTORY:  Family History  Problem Relation Age of Onset   Hicks Brother    Hicks Brother     CURRENT MEDICATIONS:  Outpatient Encounter Medications as of 12/16/2021  Medication Sig   amLODipine (NORVASC) 10 MG tablet Take 1 tablet (10 mg total) by mouth daily.   carvedilol (COREG) 6.25 MG tablet Take 1 tablet (6.25 mg total) by mouth 2 (two) times daily with a meal.   Cholecalciferol (VITAMIN D3) 50 MCG (2000 UT) TABS Take 2,000 Units by mouth daily.   diclofenac Sodium (VOLTAREN) 1 % GEL Apply 1 g topically daily as needed (Arthritis pain).   esomeprazole (NEXIUM) 10 MG packet Take 10 mg by mouth 2 (two) times daily.   hydrALAZINE (APRESOLINE)  25 MG tablet Take 25 mg by mouth 2 (two) times daily.   Menthol, Topical Analgesic, (BIOFREEZE EX) Apply 1 application  topically daily as needed (pain).   Mouthwashes (MOUTH RINSE) LIQD solution 15 mLs by Mouth Rinse route 4 (four) times daily - after meals and at bedtime.   oxyCODONE (OXY IR/ROXICODONE) 5 MG immediate release tablet Take 5 mg by mouth 3 (three) times daily.   polyethylene glycol powder (GLYCOLAX/MIRALAX) 17 GM/SCOOP powder Take 1 capfull (17g) by mouth 2 (two) times daily.   pravastatin (PRAVACHOL) 80 MG tablet Take 80 mg by mouth daily.   RAPAFLO 8 MG CAPS capsule Take 8 mg by mouth daily. Silodosin   vitamin B-12 (CYANOCOBALAMIN) 500 MCG tablet Take 500 mcg by mouth daily.   [DISCONTINUED] hydrALAZINE (APRESOLINE) 100 MG tablet Take 1 tablet (100 mg total) by mouth every 8 (eight) hours.   No facility-administered encounter medications on file as of 12/16/2021.    ALLERGIES:  No Known Allergies   PHYSICAL EXAM:  ECOG Performance status: 1  Vitals:   12/16/21 1309  BP: 136/62  Pulse: 83  Resp: 17  Temp: (!) 97 F (36.1 C)  SpO2: 100%    Filed Weights   12/16/21 1309  Weight: 172 lb (78 kg)   Physical Exam Vitals reviewed.  Constitutional:      Appearance: Normal appearance.  Cardiovascular:     Rate and Rhythm: Normal rate and regular rhythm.     Heart sounds: Normal heart sounds.  Pulmonary:     Effort: Pulmonary effort is normal.     Breath sounds: Normal breath sounds.  Abdominal:     Palpations: Abdomen is soft. There is no mass.  Neurological:     Mental Status: He is alert.  Psychiatric:        Mood and Affect: Mood normal.        Behavior: Behavior normal.      LABORATORY DATA:  I have reviewed the labs as listed.  CBC    Component Value Date/Time   WBC 6.6 11/18/2021 1413   RBC 3.55 (L) 11/18/2021 1413   HGB 11.3 (L) 11/18/2021 1413   HCT 35.1 (L) 11/18/2021 1413   PLT 289 11/18/2021 1413   MCV 98.9 11/18/2021 1413   MCH 31.8 11/18/2021 1413   MCHC 32.2 11/18/2021 1413   RDW 13.4 11/18/2021 1413   LYMPHSABS 1.6 11/18/2021 1413   MONOABS 1.0 11/18/2021 1413   EOSABS 0.2 11/18/2021 1413   BASOSABS 0.1 11/18/2021 1413      Latest Ref Rng & Units 11/18/2021    2:13 PM 11/03/2021    5:53 AM 10/29/2021    7:25 AM  CMP  Glucose 70 - 99 mg/dL 91  105  96   BUN 8 - 23 mg/dL '14  17  18   '$ Creatinine 0.61 - 1.24 mg/dL 0.83  0.85  0.72   Sodium 135 - 145 mmol/L 137  135  135   Potassium 3.5 - 5.1 mmol/L 4.2  4.2  4.1   Chloride 98 - 111 mmol/L 104  103  104   CO2 22 - 32 mmol/L '25  26  22   '$ Calcium 8.9 - 10.3 mg/dL 9.9  10.0  10.0   Total Protein 6.5 - 8.1 g/dL 7.6   6.3   Total Bilirubin 0.3 - 1.2 mg/dL 0.3   0.4   Alkaline Phos 38 - 126 U/L 57   52   AST 15 - 41 U/L 17  21   ALT 0 - 44 U/L 21   42     DIAGNOSTIC IMAGING:  I have independently reviewed the scans and discussed with the patient.  ASSESSMENT: 1.  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  Enlarged contralateral right submandibular node.  Enlargement of the left submandibular gland probably reactive.  No abscess. - Biopsy (07/18/2021) floor of the mouth: Invasive well to moderately differentiated keratinizing squamous cell carcinoma.  Tumor cells negative for p16. - 10/14/2021: Floor of the mouth resection, tracheostomy, bilateral selective neck dissections zones 1-3 by Dr. Redmond Baseman - Pathology: Invasive moderately differentiated keratinizing SCC, 4.1 cm, carcinoma invades for a depth of about 1.9 cm and involves saliva gland tissue.  Anterior, posterior, right, left resection margins are involved.  Deep resection margin is negative.  Metastatic carcinoma 1/3 level 1 lymph nodes.  3 level 2 and 3 level 3 lymph nodes negative for carcinoma.  0/10 lymph nodes involved in the left side neck zone 1, 2, 3 dissection.  ENE not identified.  LVI/perineural invasion not identified. - No weight loss prior to surgery.  No tingling or numbness in extremities.   2.  Social/family history: - He lives in his apartment by himself and is independent of ADLs and IADLs.  He does not drive.  He worked as a Dealer and several other jobs.  Quit smoking 1 month ago.  He smoked 1 pack/week for more than 50 years. - 1 brother had agent orange related Hicks.  Another brother also had Hicks, type unknown to the patient.   3.  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 - He was on close surveillance rather than adjuvant chemotherapy.   PLAN:   1.  Stage IVa (PT4PN1) SCC of the floor of the mouth, p16 negative: - He has adverse pathological features including T4 tumor. - I talked to pathology.  Reexcision margins were considered negative. - Reviewed PET scan (12/12/2021): Soft tissue thickening and calcification along the ventral aspect of the trachea with associated  hypermetabolism.  This corresponds to the recent tracheotomy site.  7 mm high right paratracheal lymph node new with SUV 2.4.  Likely reactive. - There is a hypermetabolism along the dome of the right hepatic lobe with a probable 2.4 cm low-attenuation lesion.  I have recommended MRI of the abdomen with and without contrast. - Will consider biopsy of the liver lesion if suspicious on MRI. - I have recommended radiation oncology evaluation.   2.  Stage Ib pure seminoma: - PET scan showed left testicle is mildly hypermetabolic SUV 3.6. - His recent tumor markers were negative on 11/18/2021. - Recommend testicular ultrasound.    Orders placed this encounter:  Orders Placed This Encounter  Procedures   MR LIVER W WO CONTRAST   US SCROTUM W/DOPPLER      [name], [place] [phone]

## 2021-12-23 DIAGNOSIS — R531 Weakness: Secondary | ICD-10-CM | POA: Diagnosis not present

## 2021-12-25 DIAGNOSIS — C049 Malignant neoplasm of floor of mouth, unspecified: Secondary | ICD-10-CM | POA: Diagnosis not present

## 2022-01-02 ENCOUNTER — Ambulatory Visit (HOSPITAL_COMMUNITY)
Admission: RE | Admit: 2022-01-02 | Discharge: 2022-01-02 | Disposition: A | Discharge status: Home / Self Care | Payer: Medicare Other | Source: Ambulatory Visit | Attending: Hematology | Admitting: Hematology

## 2022-01-02 DIAGNOSIS — K769 Liver disease, unspecified: Secondary | ICD-10-CM | POA: Diagnosis not present

## 2022-01-02 DIAGNOSIS — C6211 Malignant neoplasm of descended right testis: Secondary | ICD-10-CM | POA: Diagnosis present

## 2022-01-02 DIAGNOSIS — K7689 Other specified diseases of liver: Secondary | ICD-10-CM | POA: Diagnosis not present

## 2022-01-02 DIAGNOSIS — Z85810 Personal history of malignant neoplasm of tongue: Secondary | ICD-10-CM | POA: Diagnosis not present

## 2022-01-02 MED ORDER — GADOPICLENOL 0.5 MMOL/ML IV SOLN
8.0000 mL | Freq: Once | INTRAVENOUS | Status: AC | PRN
  Administered 2022-01-02: 8 mL via INTRAVENOUS

## 2022-01-07 ENCOUNTER — Inpatient Hospital Stay (HOSPITAL_BASED_OUTPATIENT_CLINIC_OR_DEPARTMENT_OTHER): Payer: Medicare Other | Admitting: Hematology

## 2022-01-07 VITALS — BP 152/62 | HR 82 | Temp 97.9°F | Resp 18 | Ht 69.5 in | Wt 174.5 lb

## 2022-01-07 DIAGNOSIS — K769 Liver disease, unspecified: Secondary | ICD-10-CM | POA: Diagnosis not present

## 2022-01-07 DIAGNOSIS — Z87891 Personal history of nicotine dependence: Secondary | ICD-10-CM | POA: Diagnosis not present

## 2022-01-07 DIAGNOSIS — C049 Malignant neoplasm of floor of mouth, unspecified: Secondary | ICD-10-CM | POA: Diagnosis not present

## 2022-01-07 DIAGNOSIS — I10 Essential (primary) hypertension: Secondary | ICD-10-CM | POA: Diagnosis not present

## 2022-01-07 NOTE — Progress Notes (Signed)
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 Right radical orchiectomy on 12/20/2012 2.  Floor of the mouth resection, bilateral selective neck dissections zones 1-3 on 10/14/2021  NGS Results: Not done  CURRENT THERAPY: Surveillance  BRIEF ONCOLOGIC HISTORY:  Oncology History  Testicle cancer, right  12/20/2012 Surgery   Right total orchiectomyt- 1. Testis, biopsy, right - SEMINOMA. PLEASE SEE COMMENT. 2. Testis, tumor, right - SEMINOMA, 7.5 CM. - ANGIOLYMPHATIC INVASION PRESENT. - RESECTION MARGINS, NEGATIVE FOR ATYPIA OR MALIGNANCY.   01/18/2013 PET scan   Status post right orchiectomy. No findings specific for metastatic disease. Small para-aortic nodes measuring up to 5 mm short axis, without convincing hypermetabolism. Given location, attention on follow-up is suggested.   04/17/2013 Imaging   CT CAP- No evidence of metastatic disease in the chest, abdomen or pelvis. Proximal LAD coronary artery calcification.   07/20/2013 Imaging   CT abd/pelvis- No evidence of metastatic disease in the abdomen or pelvis.   10/24/2013 Imaging   CT abd/pelvis- No findings to suggest metastatic disease in the abdomen or pelvis   10/24/2013 Imaging   Chest xray- Probable COPD.  Negative for metastatic disease.   02/21/2014 Imaging   CT abd/pelvis- Stable abdominal pelvic CT status post right orchectomy. No evidence of adenopathy or other metastatic disease.   02/21/2014 Imaging   Chest xray- Left lower lobe mild atelectasis and/or infiltrate.     09/10/2014 Imaging   CT abd/pelvis- Status post right orchiectomy.   No evidence of metastatic disease.   3 mm nonobstructing right upper pole renal calculus. No hydronephrosis.    03/14/2015 Imaging   CT abd/pelvis- Stable exam. No evidence of metastatic disease or other acute findings within the abdomen or pelvis.   09/13/2015 Imaging   CT abd/pelvis- No acute findings and no evidence for mass or adenopathy.    04/24/2016 Imaging   CT abd/pelvis: IMPRESSION: 1. Stable exam. No new or progressive findings. No features to suggest metastatic disease.     CANCER STAGING:  Cancer Staging  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[place]) Staging form: Oral Cavity, AJCC 8th Edition - Clinical stage from 11/25/2021: Stage IVA (cT4a, cN1, cM0) - Unsigned  Testicle cancer, right Staging form: Testis, AJCC 7th Edition - Clinical: Stage I (T2, N0, M0) - Signed by Baird Cancer, PA-C on 07/23/2013    INTERVAL HISTORY:  Nathaniel Hicks 81 y.o. male seen for follow-up of for subsequent scans after having an abnormal PET scan.  He is eating reasonably well but has difficulty chewing food as he has only 2 teeth left in the lower jaw.  His energy is reported as 70%.   REVIEW OF SYSTEMS:  Review of Systems  HENT:   Positive for trouble swallowing.   All other systems reviewed and are negative.    PAST MEDICAL/SURGICAL HISTORY:  Past Medical History:  Diagnosis Date   Arthritis    BPH (benign prostatic hyperplasia)    Difficult intubation    HOH (hard of hearing)    Hyperlipidemia 11/02/2017   Hypertension    Hypertension 11/02/2017   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[place])    2014   Vitamin D deficiency 11/02/2017   Past Surgical History:  Procedure Laterality Date  COLONOSCOPY N/A 11/24/2012   Procedure: COLONOSCOPY;  Surgeon: Rogene Houston, MD;  [facility];  Service: Endoscopy;  Laterality: N/A;  830-moved to [place] notified pt   FLOOR OF MOUTH BIOPSY N/A 10/14/2021   Procedure: FLOOR OF MOUTH RESECTION;  Surgeon: Melida Quitter, MD;  Location: [facility];  Service: ENT;  Laterality: N/A;   HEMORROIDECTOMY     KNEE ARTHROSCOPY WITH LATERAL MENISECTOMY Right 08/31/2017   Procedure:  KNEE ARTHROSCOPY WITH LATERAL MENISECTOMY;  Surgeon: Carole Civil, MD;  Location: [facility];  Service: Orthopedics;  Laterality: Right;   LESION EXCISION N/A 03/23/2012   Procedure: EXCISION NEOPLASM SCALP ;  Surgeon: Jamesetta So, MD;  Location: [facility];  Service: General;  Laterality: N/A;  Excision of Scalp Neoplasm   ORCHIECTOMY Right 12/20/2012   Procedure: RIGHT RADICAL ORCHIECTOMY/POSSIBLE BX RIGHT TESTICLE;  Surgeon: Marissa Nestle, MD;  Location: [facility];  Service: Urology;  Laterality: Right;   PROSTATE SURGERY     RADICAL NECK DISSECTION Bilateral 10/14/2021   Procedure: NECK DISSECTION;  Surgeon: Melida Quitter, MD;  Location: [facility];  Service: ENT;  Laterality: Bilateral;   SCALP LACERATION REPAIR     [facility]-Dr Tamala Julian   SKIN FULL THICKNESS GRAFT Bilateral 10/14/2021   Procedure: PLATYSMA FLAP CLOSURE;  Surgeon: Melida Quitter, MD;  Location: [facility];  Service: ENT;  Laterality: Bilateral;   TOOTH EXTRACTION  10/14/2021   Procedure: DENTAL EXTRACTIONS;  Surgeon: Melida Quitter, MD;  Location: [facility];  Service: ENT;;   TRACHEOSTOMY TUBE PLACEMENT N/A 10/14/2021   Procedure: TRACHEOSTOMY;  Surgeon: Melida Quitter, MD;  Location: [facility];  Service: ENT;  Laterality: N/A;     SOCIAL HISTORY:  Social History   Socioeconomic History   Marital status: Single    Spouse name: Not on file   Number of children: Not on file   Years of education: Not on file   Highest education level: Not on file  Occupational History   Not on file  Tobacco Use   Smoking status: Former    Packs/day: 0.25    Years: 50.00    Total pack years: 12.50    Types: Cigarettes   Smokeless tobacco: Never  Vaping Use   Vaping Use: Never used  Substance and Sexual Activity   Alcohol use: Not Currently   Drug use: No   Sexual activity: Yes    Birth control/protection: None  Other Topics Concern   Not on file  Social History Narrative   Not on file   Social Determinants of Health   Financial Resource Strain:  Not on file  Food Insecurity: Not on file  Transportation Needs: Not on file  Physical Activity: Not on file  Stress: Not on file  Social Connections: Not on file  Intimate Partner Violence: Not on file    FAMILY HISTORY:  Family History  Problem Relation Age of Onset   Cancer Brother    Cancer Brother     CURRENT MEDICATIONS:  Outpatient Encounter Medications as of 01/07/2022  Medication Sig   amLODipine (NORVASC) 10 MG tablet Take 1 tablet (10 mg total) by mouth daily.   carvedilol (COREG) 6.25 MG tablet Take 1 tablet (6.25 mg total) by mouth 2 (two) times daily with a meal.   Cholecalciferol (VITAMIN D3) 50 MCG (2000 UT) TABS Take 2,000 Units by mouth daily.   diclofenac Sodium (VOLTAREN) 1 % GEL Apply 1 g topically daily as needed (Arthritis pain).   esomeprazole (NEXIUM) 10 MG  packet Take 10 mg by mouth 2 (two) times daily.   hydrALAZINE (APRESOLINE) 25 MG tablet Take 25 mg by mouth 2 (two) times daily.   Menthol, Topical Analgesic, (BIOFREEZE EX) Apply 1 application  topically daily as needed (pain).   Mouthwashes (MOUTH RINSE) LIQD solution 15 mLs by Mouth Rinse route 4 (four) times daily - after meals and at bedtime.   oxyCODONE (OXY IR/ROXICODONE) 5 MG immediate release tablet Take 5 mg by mouth 3 (three) times daily.   polyethylene glycol powder (GLYCOLAX/MIRALAX) 17 GM/SCOOP powder Take 1 capfull (17g) by mouth 2 (two) times daily.   pravastatin (PRAVACHOL) 80 MG tablet Take 80 mg by mouth daily.   RAPAFLO 8 MG CAPS capsule Take 8 mg by mouth daily. Silodosin   vitamin B-12 (CYANOCOBALAMIN) 500 MCG tablet Take 500 mcg by mouth daily.   No facility-administered encounter medications on file as of 01/07/2022.    ALLERGIES:  No Known Allergies   PHYSICAL EXAM:  ECOG Performance status: 1  There were no vitals filed for this visit.  There were no vitals filed for this visit.  Physical Exam Vitals reviewed.  Constitutional:      Appearance: Normal appearance.   Cardiovascular:     Rate and Rhythm: Normal rate and regular rhythm.     Heart sounds: Normal heart sounds.  Pulmonary:     Effort: Pulmonary effort is normal.     Breath sounds: Normal breath sounds.  Abdominal:     Palpations: Abdomen is soft. There is no mass.  Neurological:     Mental Status: He is alert.  Psychiatric:        Mood and Affect: Mood normal.        Behavior: Behavior normal.     LABORATORY DATA:  I have reviewed the labs as listed.  CBC    Component Value Date/Time   WBC 6.6 11/18/2021 1413   RBC 3.55 (L) 11/18/2021 1413   HGB 11.3 (L) 11/18/2021 1413   HCT 35.1 (L) 11/18/2021 1413   PLT 289 11/18/2021 1413   MCV 98.9 11/18/2021 1413   MCH 31.8 11/18/2021 1413   MCHC 32.2 11/18/2021 1413   RDW 13.4 11/18/2021 1413   LYMPHSABS 1.6 11/18/2021 1413   MONOABS 1.0 11/18/2021 1413   EOSABS 0.2 11/18/2021 1413   BASOSABS 0.1 11/18/2021 1413      Latest Ref Rng & Units 11/18/2021    2:13 PM 11/03/2021    5:53 AM 10/29/2021    7:25 AM  CMP  Glucose 70 - 99 mg/dL 91  105  96   BUN 8 - 23 mg/dL '14  17  18   '$ Creatinine 0.61 - 1.24 mg/dL 0.83  0.85  0.72   Sodium 135 - 145 mmol/L 137  135  135   Potassium 3.5 - 5.1 mmol/L 4.2  4.2  4.1   Chloride 98 - 111 mmol/L 104  103  104   CO2 22 - 32 mmol/L '25  26  22   '$ Calcium 8.9 - 10.3 mg/dL 9.9  10.0  10.0   Total Protein 6.5 - 8.1 g/dL 7.6   6.3   Total Bilirubin 0.3 - 1.2 mg/dL 0.3   0.4   Alkaline Phos 38 - 126 U/L 57   52   AST 15 - 41 U/L 17   21   ALT 0 - 44 U/L 21   42     DIAGNOSTIC IMAGING:  I have independently reviewed the scans and discussed with  the patient.  ASSESSMENT: 1.  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  Enlarged contralateral right submandibular node.   Enlargement of the left submandibular gland probably reactive.  No abscess. - Biopsy (07/18/2021) floor of the mouth: Invasive well to moderately differentiated keratinizing squamous cell carcinoma.  Tumor cells negative for p16. - 10/14/2021: Floor of the mouth resection, tracheostomy, bilateral selective neck dissections zones 1-3 by Dr. Redmond Baseman - Pathology: Invasive moderately differentiated keratinizing SCC, 4.1 cm, carcinoma invades for a depth of about 1.9 cm and involves saliva gland tissue.  Anterior, posterior, right, left resection margins are involved.  Deep resection margin is negative.  Metastatic carcinoma 1/3 level 1 lymph nodes.  3 level 2 and 3 level 3 lymph nodes negative for carcinoma.  0/10 lymph nodes involved in the left side neck zone 1, 2, 3 dissection.  ENE not identified.  LVI/perineural invasion not identified. - I have talked to pathologist.  Reexcision margins were considered negative.  He has a high risk feature which is T4 tumor. - No weight loss prior to surgery.  No tingling or numbness in extremities.   2.  Social/family history: - He lives in his apartment by himself and is independent of ADLs and IADLs.  He does not drive.  He worked as a Dealer and several other jobs.  Quit smoking 1 month ago.  He smoked 1 pack/week for more than 50 years. - 1 brother had agent orange related cancer.  Another brother also had cancer, type unknown to the patient.   3.  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 - He was on close surveillance rather than adjuvant chemotherapy.   PLAN:   1.  Stage IVa (PT4PN1) SCC of the floor of the mouth, p16 negative: - PET scan on 12/12/2021: Soft tissue thickening and calcification along the ventral aspect of the trachea with associated hypermetabolism corresponding to recent tracheostomy site.  7 mm right paratracheal lymph node new with SUV 2.4.  Hypermetabolism along the dome of the right hepatic lobe with  probable 2.4 cm low-attenuation lesion - I have reviewed MRI of the liver from 12-22 2023 which showed solitary 2.4 x 2.5 x 2.5 cm lesion with imaging characteristics consistent with metastasis. - I have recommended CT or ultrasound-guided biopsy of this lesion.  He will come back in 1 week for follow-up. - He wants to hold off on starting radiation therapy until after the biopsy which is appropriate. - He will talk to his dentist for possibility of dentures.   2.  Stage Ib pure seminoma: - PET scan on 12/12/2021 showed left testicle is mildly hypermetabolic SUV 3.6.  Recent tumor markers on 11/18/2021 were negative. - Ultrasound scrotum on 01/02/2022 with no discrete mass.  However there is increased vascularity suggesting epididymoorchitis.  Patient does not have any clinical signs or symptoms of epididymoorchitis.    Orders placed this encounter:  No orders of the defined types were placed in this encounter.     [name], MD [place] [phone]

## 2022-01-07 NOTE — Patient Instructions (Addendum)
[place] at [facility] Discharge Instructions   You were seen and examined today by Dr. Delton Coombes.  He reviewed the results of your testicular ultrasound which was normal.   He reviewed the results of the MRI of the liver. There is a spot on the right upper part of the liver that is likely cancer. We need to obtain a biopsy of this lesion to determine what type of cancer this may be since you have multiple cancer diagnoses.   We will see you back 1 week after the biopsy to review results.    Thank you for choosing [place] at [facility] to provide your oncology and hematology care.  To afford each patient quality time with our provider, please arrive at least 15 minutes before your scheduled appointment time.   If you have a lab appointment with the [facility] please come in thru the Main Entrance and check in at the main information desk.  You need to re-schedule your appointment should you arrive 10 or more minutes late.  We strive to give you quality time with our providers, and arriving late affects you and other patients whose appointments are after yours.  Also, if you no show three or more times for appointments you may be dismissed from the clinic at the providers discretion.     Again, thank you for choosing [facility].  Our hope is that these requests will decrease the amount of time that you wait before being seen by our physicians.       _____________________________________________________________  Should you have questions after your visit to [facility], please contact our office at [phone] and follow the prompts.  Our office hours are 8:00 a.m. and 4:30 p.m. Monday - Friday.  Please note that voicemails left after 4:00 p.m. may not be returned until the following business day.  We are closed weekends and major holidays.  You do have access to a nurse 24-7, just call the main number to the  clinic [phone] and do not press any options, hold on the line and a nurse will answer the phone.    For prescription refill requests, have your pharmacy contact our office and allow 72 hours.    Due to Covid, you will need to wear a mask upon entering the hospital. If you do not have a mask, a mask will be given to you at the Main Entrance upon arrival. For doctor visits, patients may have 1 support person age 74 or older with them. For treatment visits, patients can not have anyone with them due to social distancing guidelines and our immunocompromised population.

## 2022-01-08 ENCOUNTER — Encounter: Payer: Self-pay | Admitting: General Practice

## 2022-01-08 NOTE — Progress Notes (Signed)
Arne Cleveland, MD  Allen Kell, NT Yes any of  the IRs can cover this one Thx DDH       Previous Messages    ----- Message ----- From: Allen Kell, NT Sent: 01/08/2022  12:15 PM EST To: Arne Cleveland, MD Subject: RE: CT Biopsy                                  Ok will do. Is it ok for any IR doc to complete? ----- Message ----- From: Arne Cleveland, MD Sent: 01/08/2022  12:00 PM EST To: Allen Kell, NT Subject: RE: CT Biopsy                                  Correct Thx DDH ----- Message ----- From: Allen Kell, NT Sent: 01/08/2022  11:28 AM EST To: Arne Cleveland, MD; Paula Libra Mir, MD; * Subject: RE: CT Biopsy                                  Is this how you want me to set it up and schedule? Thanks ----- Message ----- From: Arne Cleveland, MD Sent: 01/08/2022  10:52 AM EST To: Paula Libra Mir, MD; Allen Kell, NT; * Subject: RE: CT Biopsy                                  Probably correct, but chance there might be an [place] window. I would set up for [place] core biopsy+sedation, come to rad dept 1st make sure we can see it before short stay. Not visible on CT part of PETCT so that's not an option.  DDH ----- Message ----- From: Mir, Paula Libra, MD Sent: 01/07/2022   5:06 PM EST To: Allen Kell, NT; Ir Procedure Requests Subject: RE: CT Biopsy                                  Hi everyone,  I think this lesion is too high up in the liver to target.  Would anyone want to attempt it?  Mir  ----- Message ----- From: Allen Kell, NT Sent: 01/07/2022   2:17 PM EST To: Ir Procedure Requests Subject: CT Biopsy                                      Procedure: CT Biopsy  Reason: bx right upper liver lesion  History: MRI, PET in chart  Provider: [name], MD  Contact: [phone]

## 2022-01-08 NOTE — Progress Notes (Signed)
Arne Cleveland, MD  Allen Kell, NT Correct Thx DDH       Previous Messages    ----- Message ----- From: Allen Kell, [place] Sent: 01/08/2022  11:28 AM EST To: Arne Cleveland, MD; Paula Libra Mir, MD; * Subject: RE: CT Biopsy                                  Is this how you want me to set it up and schedule? Thanks ----- Message ----- From: Arne Cleveland, MD Sent: 01/08/2022  10:52 AM EST To: Paula Libra Mir, MD; Allen Kell, NT; * Subject: RE: CT Biopsy                                  Probably correct, but chance there might be an [place] window. I would set up for [place] core biopsy+sedation, come to rad dept 1st make sure we can see it before short stay. Not visible on CT part of PETCT so that's not an option.  DDH ----- Message ----- From: Mir, Paula Libra, MD Sent: 01/07/2022   5:06 PM EST To: Allen Kell, NT; Ir Procedure Requests Subject: RE: CT Biopsy                                  Hi everyone,  I think this lesion is too high up in the liver to target.  Would anyone want to attempt it?  Mir  ----- Message ----- From: Allen Kell, NT Sent: 01/07/2022   2:17 PM EST To: Ir Procedure Requests Subject: CT Biopsy                                      Procedure: CT Biopsy  Reason: bx right upper liver lesion  History: MRI, PET in chart  Provider: [name], MD  Contact: [phone]

## 2022-01-21 ENCOUNTER — Ambulatory Visit: Payer: Medicare Other

## 2022-01-23 DIAGNOSIS — R531 Weakness: Secondary | ICD-10-CM | POA: Diagnosis not present

## 2022-01-27 ENCOUNTER — Other Ambulatory Visit: Payer: Self-pay | Admitting: Radiology

## 2022-01-27 DIAGNOSIS — K769 Liver disease, unspecified: Secondary | ICD-10-CM

## 2022-01-28 ENCOUNTER — Other Ambulatory Visit: Payer: Self-pay

## 2022-01-28 ENCOUNTER — Ambulatory Visit (HOSPITAL_COMMUNITY)
Admission: RE | Admit: 2022-01-28 | Discharge: 2022-01-28 | Disposition: A | Discharge status: Home / Self Care | Payer: 59 | Source: Ambulatory Visit | Attending: Hematology | Admitting: Hematology

## 2022-01-28 DIAGNOSIS — K769 Liver disease, unspecified: Secondary | ICD-10-CM

## 2022-01-28 DIAGNOSIS — C049 Malignant neoplasm of floor of mouth, unspecified: Secondary | ICD-10-CM | POA: Diagnosis not present

## 2022-01-28 DIAGNOSIS — C801 Malignant (primary) neoplasm, unspecified: Secondary | ICD-10-CM | POA: Diagnosis not present

## 2022-01-28 DIAGNOSIS — C787 Secondary malignant neoplasm of liver and intrahepatic bile duct: Secondary | ICD-10-CM | POA: Insufficient documentation

## 2022-01-28 DIAGNOSIS — K7689 Other specified diseases of liver: Secondary | ICD-10-CM | POA: Diagnosis not present

## 2022-01-28 DIAGNOSIS — Z8547 Personal history of malignant neoplasm of testis: Secondary | ICD-10-CM | POA: Diagnosis not present

## 2022-01-28 LAB — CBC
HCT: 37.6 % — ABNORMAL LOW (ref 39.0–52.0)
Hemoglobin: 12.3 g/dL — ABNORMAL LOW (ref 13.0–17.0)
MCH: 32.4 pg (ref 26.0–34.0)
MCHC: 32.7 g/dL (ref 30.0–36.0)
MCV: 98.9 fL (ref 80.0–100.0)
Platelets: 252 10*3/uL (ref 150–400)
RBC: 3.8 MIL/uL — ABNORMAL LOW (ref 4.22–5.81)
RDW: 14.7 % (ref 11.5–15.5)
WBC: 5.6 10*3/uL (ref 4.0–10.5)
nRBC: 0 % (ref 0.0–0.2)

## 2022-01-28 LAB — PROTIME-INR
INR: 1 (ref 0.8–1.2)
Prothrombin Time: 13.4 seconds (ref 11.4–15.2)

## 2022-01-28 MED ORDER — SODIUM CHLORIDE 0.9 % IV SOLN: INTRAVENOUS | Status: DC

## 2022-01-28 MED ORDER — SODIUM CHLORIDE 0.9 % IV SOLN
INTRAVENOUS | Status: AC | PRN
  Administered 2022-01-28: 10 mL/h via INTRAVENOUS

## 2022-01-28 MED ORDER — MIDAZOLAM HCL 2 MG/2ML IJ SOLN
INTRAMUSCULAR | Status: AC
  Filled 2022-01-28: qty 2

## 2022-01-28 MED ORDER — FENTANYL CITRATE (PF) 100 MCG/2ML IJ SOLN
INTRAMUSCULAR | Status: AC | PRN
  Administered 2022-01-28: 25 ug via INTRAVENOUS
  Administered 2022-01-28: 50 ug via INTRAVENOUS

## 2022-01-28 MED ORDER — LIDOCAINE HCL (PF) 1 % IJ SOLN
INTRAMUSCULAR | Status: AC
  Filled 2022-01-28: qty 30

## 2022-01-28 MED ORDER — MIDAZOLAM HCL 2 MG/2ML IJ SOLN
INTRAMUSCULAR | Status: AC | PRN
  Administered 2022-01-28: .5 mg via INTRAVENOUS
  Administered 2022-01-28: 1 mg via INTRAVENOUS

## 2022-01-28 MED ORDER — GELATIN ABSORBABLE 12-7 MM EX MISC
1.0000 | Freq: Once | CUTANEOUS | Status: AC
  Administered 2022-01-28: 1 via TOPICAL

## 2022-01-28 MED ORDER — GELATIN ABSORBABLE 12-7 MM EX MISC
CUTANEOUS | Status: AC
  Filled 2022-01-28: qty 1

## 2022-01-28 MED ORDER — FENTANYL CITRATE (PF) 100 MCG/2ML IJ SOLN
INTRAMUSCULAR | Status: AC
  Filled 2022-01-28: qty 2

## 2022-01-28 MED ORDER — LIDOCAINE HCL (PF) 1 % IJ SOLN
5.0000 mL | Freq: Once | INTRAMUSCULAR | Status: AC
  Administered 2022-01-28: 5 mL via INTRADERMAL

## 2022-01-28 NOTE — H&P (Signed)
Chief Complaint: Patient was seen in consultation today for liver lesion biopsy  Referring Physician(s): [place]  Supervising Physician: Ruthann Cancer  Patient Status: [facility] - Out-pt  History of Present Illness: Nathaniel Hicks is an 82 y.o. 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 A staging PET 12/11/21 showed lymphadenopathy and a lesion in the dome of the right hepatic lobe worrisome for metastases.   NM PET 12/11/21 IMPRESSION: 1. Soft tissue thickening and calcification along the ventral upper trachea with associated hypermetabolism. Given interval radical neck dissection, findings may be postoperative in etiology but disease recurrence is also a consideration. 2. 7 mm high right paratracheal lymph node is new and hypermetabolic, worrisome for a metastasis. Focal hypermetabolism along the dome of the right hepatic lobe with a probable 2.4 cm low-attenuation lesion, worrisome for a metastasis. Respiratory misregistration artifact in this area is challenging. Consider MR abdomen without and with contrast in further evaluation, as clinically indicated. 3. Enlarged prostate with focal hypermetabolism. Difficult to exclude malignancy. 4. Hypermetabolic left testicle. Please correlate clinically in this patient with a history of testicular cancer. 5. Tiny right renal stone. 6. Aortic atherosclerosis (ICD10-I70.0). Coronary artery calcification. 7. Emphysema (ICD10-J43.9).  Interventional Radiology has been asked to evaluate this patient for an image-guided liver lesion biopsy. Imaging reviewed and procedure approved by Dr. Vernard Gambles.    Past Medical History:  Diagnosis Date   Arthritis    BPH (benign prostatic hyperplasia)    Difficult intubation    HOH (hard of hearing)    Hyperlipidemia 11/02/2017   Hypertension    Hypertension  11/02/2017   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[place])    2014   Vitamin D deficiency 11/02/2017    Past Surgical History:  Procedure Laterality Date   COLONOSCOPY N/A 11/24/2012   Procedure: COLONOSCOPY;  Surgeon: Rogene Houston, MD;  [facility];  Service: Endoscopy;  Laterality: N/A;  830-moved to [place] notified pt   FLOOR OF MOUTH BIOPSY N/A 10/14/2021   Procedure: FLOOR OF MOUTH RESECTION;  Surgeon: Melida Quitter, MD;  Location: [facility];  Service: ENT;  Laterality: N/A;   HEMORROIDECTOMY     KNEE ARTHROSCOPY WITH LATERAL MENISECTOMY Right 08/31/2017   Procedure: KNEE ARTHROSCOPY WITH LATERAL MENISECTOMY;  Surgeon: Carole Civil, MD;  Location: [facility];  Service: Orthopedics;  Laterality: Right;   LESION EXCISION N/A 03/23/2012   Procedure: EXCISION NEOPLASM SCALP ;  Surgeon: Jamesetta So, MD;  Location: [facility];  Service: General;  Laterality: N/A;  Excision of Scalp Neoplasm   ORCHIECTOMY Right 12/20/2012   Procedure: RIGHT RADICAL ORCHIECTOMY/POSSIBLE BX RIGHT TESTICLE;  Surgeon: Marissa Nestle, MD;  Location: [facility];  Service: Urology;  Laterality: Right;   PROSTATE SURGERY     RADICAL NECK DISSECTION Bilateral 10/14/2021   Procedure: NECK DISSECTION;  Surgeon: Melida Quitter, MD;  Location: [place];  Service: ENT;  Laterality: Bilateral;   SCALP LACERATION REPAIR     [facility]-Dr Tamala Julian   SKIN FULL THICKNESS GRAFT Bilateral 10/14/2021   Procedure: PLATYSMA FLAP CLOSURE;  Surgeon: Melida Quitter, MD;  Location: [facility];  Service: ENT;  Laterality: Bilateral;   TOOTH EXTRACTION  10/14/2021   Procedure: DENTAL EXTRACTIONS;  Surgeon: Melida Quitter, MD;  Location: [place];  Service: ENT;;   TRACHEOSTOMY TUBE PLACEMENT N/A 10/14/2021   Procedure: TRACHEOSTOMY;  Surgeon: Melida Quitter, MD;  Location: [place];  Service:  ENT;  Laterality: N/A;    Allergies: Patient has no known allergies.  Medications: Prior to Admission medications   Medication Sig Start Date End Date Taking? Authorizing Provider   amLODipine (NORVASC) 10 MG tablet Take 1 tablet (10 mg total) by mouth daily. 11/06/21  Yes Love, Ivan Anchors, PA-C  carvedilol (COREG) 6.25 MG tablet Take 1 tablet (6.25 mg total) by mouth 2 (two) times daily with a meal. 11/06/21  Yes Love, Ivan Anchors, PA-C  Cholecalciferol (VITAMIN D3) 50 MCG (2000 UT) TABS Take 2,000 Units by mouth daily.   Yes [provider]  diclofenac Sodium (VOLTAREN) 1 % GEL Apply 1 g topically daily as needed (Arthritis pain).   Yes [provider]  hydrALAZINE (APRESOLINE) 25 MG tablet Take 25 mg by mouth 2 (two) times daily. 11/19/21  Yes [provider]  oxyCODONE (OXY IR/ROXICODONE) 5 MG immediate release tablet Take 5 mg by mouth 3 (three) times daily. 11/13/21  Yes [provider]  pravastatin (PRAVACHOL) 80 MG tablet Take 80 mg by mouth daily.   Yes [provider]  RAPAFLO 8 MG CAPS capsule Take 8 mg by mouth daily. Silodosin 10/13/13  Yes [provider]  vitamin B-12 (CYANOCOBALAMIN) 500 MCG tablet Take 500 mcg by mouth daily.   Yes [provider]  esomeprazole (NEXIUM) 10 MG packet Take 10 mg by mouth 2 (two) times daily. 11/06/21   Love, Ivan Anchors, PA-C  Menthol, Topical Analgesic, (BIOFREEZE EX) Apply 1 application  topically daily as needed (pain).    [provider]  Mouthwashes (MOUTH RINSE) LIQD solution 15 mLs by Mouth Rinse route 4 (four) times daily - after meals and at bedtime. 11/05/21   Love, Ivan Anchors, PA-C  polyethylene glycol powder (GLYCOLAX/MIRALAX) 17 GM/SCOOP powder Take 1 capfull (17g) by mouth 2 (two) times daily. 11/05/21   Love, Ivan Anchors, PA-C     Family History  Problem Relation Age of Onset   Cancer Brother    Cancer Brother     Social History   Socioeconomic History   Marital status: Single    Spouse name: Not on file   Number of children: Not on file   Years of education: Not on file   Highest education level: Not on file  Occupational History   Not on file   Tobacco Use   Smoking status: Former    Packs/day: 0.25    Years: 50.00    Total pack years: 12.50    Types: Cigarettes   Smokeless tobacco: Never  Vaping Use   Vaping Use: Never used  Substance and Sexual Activity   Alcohol use: Not Currently   Drug use: No   Sexual activity: Yes    Birth control/protection: None  Other Topics Concern   Not on file  Social History Narrative   Not on file   Social Determinants of Health   Financial Resource Strain: Not on file  Food Insecurity: Not on file  Transportation Needs: Not on file  Physical Activity: Not on file  Stress: Not on file  Social Connections: Not on file    Review of Systems: A 12 point ROS discussed and pertinent positives are indicated in the HPI above.  All other systems are negative.  Review of Systems  HENT:  Positive for voice change.   Respiratory:  Negative for cough and shortness of breath.   Cardiovascular:  Negative for chest pain and leg swelling.  Gastrointestinal:  Negative for abdominal pain, diarrhea, nausea and  vomiting.  Neurological:  Negative for dizziness and headaches.    Vital Signs: BP (!) 148/78   Pulse 86   Temp 97.6 F (36.4 C) (Temporal)   Resp 18   Ht 5' 9.5" (1.765 m)   Wt 175 lb (79.4 kg)   SpO2 97%   BMI 25.47 kg/m   Physical Exam Constitutional:      General: He is not in acute distress.    Appearance: He is not ill-appearing.  HENT:     Mouth/Throat:     Mouth: Mucous membranes are moist.     Pharynx: Oropharynx is clear.     Comments: Speech mildly garbled from recent surgery.  Cardiovascular:     Rate and Rhythm: Normal rate and regular rhythm.     Pulses: Normal pulses.     Heart sounds: Normal heart sounds.  Pulmonary:     Effort: Pulmonary effort is normal.     Breath sounds: Normal breath sounds.  Abdominal:     General: Bowel sounds are normal.     Palpations: Abdomen is soft.     Tenderness: There is no abdominal tenderness.  Skin:    General: Skin  is warm and dry.  Neurological:     Mental Status: He is alert and oriented to person, place, and time.  Psychiatric:        Mood and Affect: Mood normal.        Behavior: Behavior normal.        Thought Content: Thought content normal.        Judgment: Judgment normal.     Imaging: MR LIVER W WO CONTRAST  Result Date: 01/07/2022 CLINICAL DATA:  82 year old male with history of tongue cancer and testicular cancer. Possible liver lesion noted on prior PET-CT. Follow-up study. EXAM: MRI ABDOMEN WITHOUT AND WITH CONTRAST TECHNIQUE: Multiplanar multisequence MR imaging of the abdomen was performed both before and after the administration of intravenous contrast. CONTRAST:  8 mL of Gadavist. COMPARISON:  PET-CT 12/12/2021.  No prior abdominal MRI. FINDINGS: Lower chest: Unremarkable. Hepatobiliary: In the superior aspect of segment 8 of the liver (axial image 26 of series 14 and coronal image 39 of series 18) there is a 2.4 x 2.5 x 2.5 cm lesion which is low T1 signal intensity, generally T2 hypointense with some predominantly peripheral T2 hyperintensity, which demonstrates diffusion restriction and is hypovascular on post gadolinium imaging with predominantly peripheral low-level enhancement, likely to represent a metastatic lesion. No other suspicious appearing hepatic lesions are noted. Subcentimeter T1 hypointense, T2 hyperintense, nonenhancing lesion in segment 4B of the liver just lateral to the falciform ligament, compatible with a small simple cyst. No intra or extrahepatic biliary ductal dilatation. Gallbladder is nearly completely decompressed, but otherwise unremarkable in appearance. Common bile duct measures only 3 mm in the porta hepatis. Pancreas: No pancreatic mass. No pancreatic ductal dilatation. No pancreatic or peripancreatic fluid collections or inflammatory changes. Spleen:  Unremarkable. Adrenals/Urinary Tract: T1 hypointense, T2 hyperintense, nonenhancing lesions in both kidneys  compatible with simple (Bosniak class 1) cysts, largest of which measures 1.5 cm in the lateral aspect of the interpolar region of the right kidney (no imaging follow-up recommended). No aggressive appearing renal lesions. No hydroureteronephrosis in the visualized portions of the abdomen. Bilateral adrenal glands are normal in appearance. Stomach/Bowel: Visualized portions are unremarkable. Vascular/Lymphatic: No aneurysm identified in the visualized abdominal vasculature. No lymphadenopathy noted in the abdomen. Other: No significant volume of ascites noted in the visualized portions of the peritoneal  cavity. Musculoskeletal: No aggressive appearing osseous lesions are noted in the visualized portions of the skeleton. IMPRESSION: 1. Solitary 2.4 x 2.5 x 2.5 cm lesion in segment 8 of the liver has imaging characteristics compatible with a metastatic lesion. Electronically Signed   By: Vinnie Langton M.D.   On: 01/07/2022 08:43   US SCROTUM W/DOPPLER  Result Date: 01/05/2022 CLINICAL DATA:  testicular update on PET, history of testicular cancer EXAM: SCROTAL ULTRASOUND DOPPLER ULTRASOUND OF THE TESTICLES TECHNIQUE: Complete ultrasound examination of the testicles, epididymis, and other scrotal structures was performed. Color and spectral Doppler ultrasound were also utilized to evaluate blood flow to the testicles. COMPARISON:  December 11, 2021 PET FINDINGS: Right testicle Surgically absent Left testicle Measurements: 3.8 x 2.2 x 2.8 cm. No mass or microlithiasis visualized. Testicle is mildly heterogeneous in appearance. Query increased blood flow. Right epididymis:  Surgically absent. Left epididymis: Heterogeneous in appearance. Question of increased vascularity. Hydrocele:  Small volume LEFT-sided fluid. Varicocele:  None visualized. Pulsed Doppler interrogation of LEFT testes demonstrates normal low resistance arterial and venous waveforms. IMPRESSION: 1. No discrete mass is identified within the LEFT  testicle. However the testicle and epididymis are mildly heterogeneous in appearance with suggestion of increased vascularity. Findings could reflect epididymo-orchitis in the appropriate clinical setting. Consider follow-up ultrasound after appropriate treatment. Electronically Signed   By: Valentino Saxon M.D.   On: 01/05/2022 16:27    Labs:  CBC: Recent Labs    10/29/21 0725 11/03/21 0553 11/18/21 1413 01/28/22 1124  WBC 8.2 7.2 6.6 5.6  HGB 10.6* 10.9* 11.3* 12.3*  HCT 32.7* 33.1* 35.1* 37.6*  PLT 352 420* 289 252    COAGS: Recent Labs    07/14/21 0727  INR 1.0    BMP: Recent Labs    10/22/21 0148 10/29/21 0725 11/03/21 0553 11/18/21 1413  NA 139 135 135 137  K 4.2 4.1 4.2 4.2  CL 104 104 103 104  CO2 '26 22 26 25  '$ GLUCOSE 123* 96 105* 91  BUN '19 18 17 14  '$ CALCIUM 9.6 10.0 10.0 9.9  CREATININE 0.70 0.72 0.85 0.83  GFRNONAA >60 >60 >60 >60    LIVER FUNCTION TESTS: Recent Labs    07/20/21 0626 10/16/21 1118 10/29/21 0725 11/18/21 1413  BILITOT 0.7 0.9 0.4 0.3  AST '31 16 21 17  '$ ALT 45* 9 42 21  ALKPHOS 41 41 52 57  PROT 5.9* 7.0 6.3* 7.6  ALBUMIN 2.4* 3.2* 2.8* 3.2*    TUMOR MARKERS: No results for input(s): "AFPTM", "CEA", "CA199", "CHROMGRNA" in the last 8760 hours.  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 [place], 82 year old male, presents today to the [facility] department for an image-guided liver lesion biopsy.  Risks and benefits of this procedure were discussed with the patient and/or patient's family including, but not limited to bleeding, infection, damage to adjacent structures or low yield requiring additional tests.  All of the questions were answered and there is agreement to proceed. He has been NPO. He does not take any blood-thinning medications.   Consent signed and in chart.   Thank you for this interesting consult.  I  greatly enjoyed meeting AZRAEL MADDIX and look forward to participating in their care.  A copy of this report was sent to the requesting provider on this date.  Electronically Signed: Soyla Dryer, AGACNP-BC [phone] 01/28/2022, 11:48 AM   I spent a total of  30 Minutes   in face to face in clinical consultation, greater than 50% of which was counseling/coordinating care for liver lesion biopsy

## 2022-01-28 NOTE — Sedation Documentation (Signed)
[employer] [place] tech placing 5 minutes of pressure to biopsy site at request of Dr Dwaine Gale. Gelfoam was used.

## 2022-01-28 NOTE — Progress Notes (Signed)
Patient and sister was given discharge instructions. Both verbalized understanding.

## 2022-01-28 NOTE — Procedures (Signed)
Interventional Radiology Procedure Note  Procedure: US guided right liver mass biopsy  Indication: Right liver lesion  Findings: Please refer to procedural dictation for full description.  Complications: None  EBL: < 10 mL  Miachel Roux, MD [phone]

## 2022-02-04 ENCOUNTER — Ambulatory Visit: Payer: Medicare Other | Admitting: Hematology

## 2022-02-04 ENCOUNTER — Encounter: Payer: Self-pay | Admitting: Hematology

## 2022-02-04 ENCOUNTER — Inpatient Hospital Stay: Payer: 59 | Attending: Hematology | Admitting: Hematology

## 2022-02-04 VITALS — BP 141/63 | HR 91 | Temp 97.6°F | Resp 18 | Wt 177.9 lb

## 2022-02-04 DIAGNOSIS — Z87891 Personal history of nicotine dependence: Secondary | ICD-10-CM | POA: Insufficient documentation

## 2022-02-04 DIAGNOSIS — C787 Secondary malignant neoplasm of liver and intrahepatic bile duct: Secondary | ICD-10-CM | POA: Diagnosis not present

## 2022-02-04 DIAGNOSIS — Z8547 Personal history of malignant neoplasm of testis: Secondary | ICD-10-CM | POA: Insufficient documentation

## 2022-02-04 DIAGNOSIS — C049 Malignant neoplasm of floor of mouth, unspecified: Secondary | ICD-10-CM | POA: Insufficient documentation

## 2022-02-04 DIAGNOSIS — I10 Essential (primary) hypertension: Secondary | ICD-10-CM | POA: Diagnosis not present

## 2022-02-04 DIAGNOSIS — Z9079 Acquired absence of other genital organ(s): Secondary | ICD-10-CM | POA: Insufficient documentation

## 2022-02-04 NOTE — Progress Notes (Signed)
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 Right radical orchiectomy on 12/20/2012 2.  Floor of the mouth resection, bilateral selective neck dissections zones 1-3 on 10/14/2021  NGS Results: Not done  CURRENT THERAPY: Under workup  BRIEF ONCOLOGIC HISTORY:  Oncology History  Testicle cancer, right  12/20/2012 Surgery   Right total orchiectomyt- 1. Testis, biopsy, right - SEMINOMA. PLEASE SEE COMMENT. 2. Testis, tumor, right - SEMINOMA, 7.5 CM. - ANGIOLYMPHATIC INVASION PRESENT. - RESECTION MARGINS, NEGATIVE FOR ATYPIA OR MALIGNANCY.   01/18/2013 PET scan   Status post right orchiectomy. No findings specific for metastatic disease. Small para-aortic nodes measuring up to 5 mm short axis, without convincing hypermetabolism. Given location, attention on follow-up is suggested.   04/17/2013 Imaging   CT CAP- No evidence of metastatic disease in the chest, abdomen or pelvis. Proximal LAD coronary artery calcification.   07/20/2013 Imaging   CT abd/pelvis- No evidence of metastatic disease in the abdomen or pelvis.   10/24/2013 Imaging   CT abd/pelvis- No findings to suggest metastatic disease in the abdomen or pelvis   10/24/2013 Imaging   Chest xray- Probable COPD.  Negative for metastatic disease.   02/21/2014 Imaging   CT abd/pelvis- Stable abdominal pelvic CT status post right orchectomy. No evidence of adenopathy or other metastatic disease.   02/21/2014 Imaging   Chest xray- Left lower lobe mild atelectasis and/or infiltrate.     09/10/2014 Imaging   CT abd/pelvis- Status post right orchiectomy.   No evidence of metastatic disease.   3 mm nonobstructing right upper pole renal calculus. No hydronephrosis.    03/14/2015 Imaging   CT abd/pelvis- Stable exam. No evidence of metastatic disease or other acute findings within the abdomen or pelvis.   09/13/2015 Imaging   CT abd/pelvis- No acute findings and no evidence for mass or adenopathy.    04/24/2016 Imaging   CT abd/pelvis: IMPRESSION: 1. Stable exam. No new or progressive findings. No features to suggest metastatic disease.     CANCER STAGING:  Cancer Staging  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[place]) Staging form: Oral Cavity, AJCC 8th Edition - Clinical stage from 11/25/2021: Stage IVA (cT4a, cN1, cM0) - Unsigned  Testicle cancer, right Staging form: Testis, AJCC 7th Edition - Clinical: Stage I (T2, N0, M0) - Signed by Baird Cancer, PA-C on 07/23/2013    INTERVAL HISTORY:  Nathaniel Hicks 82 y.o. male seen for follow-up after liver lesion biopsy.  He is reporting energy levels of 25%.  He is eating is improving.  He continues to have some trouble swallowing to certain foods.  His weight has gone up by 3 pounds.  Denies any abdominal pains.   REVIEW OF SYSTEMS:  Review of Systems  HENT:   Positive for trouble swallowing.   All other systems reviewed and are negative.    PAST MEDICAL/SURGICAL HISTORY:  Past Medical History:  Diagnosis Date   Arthritis    BPH (benign prostatic hyperplasia)    Difficult intubation    HOH (hard of hearing)    Hyperlipidemia 11/02/2017   Hypertension    Hypertension 11/02/2017   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[place])    2014   Vitamin D deficiency 11/02/2017   Past Surgical History:  Procedure Laterality Date   COLONOSCOPY N/A 11/24/2012   Procedure: COLONOSCOPY;  Surgeon: Rogene Houston, MD;  [facility];  Service: Endoscopy;  Laterality: N/A;  830-moved to [place] notified pt   FLOOR OF MOUTH BIOPSY N/A 10/14/2021   Procedure: FLOOR OF MOUTH RESECTION;  Surgeon: Melida Quitter, MD;  Location: [place];  Service: ENT;  Laterality: N/A;   HEMORROIDECTOMY     KNEE ARTHROSCOPY WITH LATERAL MENISECTOMY  Right 08/31/2017   Procedure: KNEE ARTHROSCOPY WITH LATERAL MENISECTOMY;  Surgeon: Carole Civil, MD;  Location: [facility];  Service: Orthopedics;  Laterality: Right;   LESION EXCISION N/A 03/23/2012   Procedure: EXCISION NEOPLASM SCALP ;  Surgeon: Jamesetta So, MD;  Location: [facility];  Service: General;  Laterality: N/A;  Excision of Scalp Neoplasm   ORCHIECTOMY Right 12/20/2012   Procedure: RIGHT RADICAL ORCHIECTOMY/POSSIBLE BX RIGHT TESTICLE;  Surgeon: Marissa Nestle, MD;  Location: [facility];  Service: Urology;  Laterality: Right;   PROSTATE SURGERY     RADICAL NECK DISSECTION Bilateral 10/14/2021   Procedure: NECK DISSECTION;  Surgeon: Melida Quitter, MD;  Location: [facility];  Service: ENT;  Laterality: Bilateral;   SCALP LACERATION REPAIR     [facility]-Dr Tamala Julian   SKIN FULL THICKNESS GRAFT Bilateral 10/14/2021   Procedure: PLATYSMA FLAP CLOSURE;  Surgeon: Melida Quitter, MD;  Location: [facility];  Service: ENT;  Laterality: Bilateral;   TOOTH EXTRACTION  10/14/2021   Procedure: DENTAL EXTRACTIONS;  Surgeon: Melida Quitter, MD;  Location: [facility];  Service: ENT;;   TRACHEOSTOMY TUBE PLACEMENT N/A 10/14/2021   Procedure: TRACHEOSTOMY;  Surgeon: Melida Quitter, MD;  Location: [facility];  Service: ENT;  Laterality: N/A;     SOCIAL HISTORY:  Social History   Socioeconomic History   Marital status: Single    Spouse name: Not on file   Number of children: Not on file   Years of education: Not on file   Highest education level: Not on file  Occupational History   Not on file  Tobacco Use   Smoking status: Former    Packs/day: 0.25    Years: 50.00    Total pack years: 12.50    Types: Cigarettes   Smokeless tobacco: Never  Vaping Use   Vaping Use: Never used  Substance and Sexual Activity   Alcohol use: Not Currently   Drug use: No   Sexual activity: Yes    Birth control/protection: None  Other Topics Concern   Not on file  Social History Narrative   Not on file   Social Determinants of Health    Financial Resource Strain: Not on file  Food Insecurity: Not on file  Transportation Needs: Not on file  Physical Activity: Not on file  Stress: Not on file  Social Connections: Not on file  Intimate Partner Violence: Not on file    FAMILY HISTORY:  Family History  Problem Relation Age of Onset   Cancer Brother    Cancer Brother     CURRENT MEDICATIONS:  Outpatient Encounter Medications as of 02/04/2022  Medication Sig   amLODipine (NORVASC) 10 MG tablet Take 1 tablet (10 mg total) by mouth daily.   carvedilol (COREG) 6.25 MG tablet Take 1 tablet (6.25 mg total) by mouth 2 (two) times daily with a meal.   Cholecalciferol (VITAMIN D3) 50 MCG (2000 UT) TABS Take 2,000 Units by mouth daily.   diclofenac Sodium (VOLTAREN) 1 % GEL Apply 1 g topically daily as needed (Arthritis pain).  hydrALAZINE (APRESOLINE) 25 MG tablet Take 25 mg by mouth 2 (two) times daily.   Menthol, Topical Analgesic, (BIOFREEZE EX) Apply 1 application  topically daily as needed (pain).   oxyCODONE (OXY IR/ROXICODONE) 5 MG immediate release tablet Take 5 mg by mouth 3 (three) times daily.   pravastatin (PRAVACHOL) 80 MG tablet Take 80 mg by mouth daily.   RAPAFLO 8 MG CAPS capsule Take 8 mg by mouth daily. Silodosin   vitamin B-12 (CYANOCOBALAMIN) 500 MCG tablet Take 500 mcg by mouth daily.   [DISCONTINUED] esomeprazole (NEXIUM) 10 MG packet Take 10 mg by mouth 2 (two) times daily.   [DISCONTINUED] Mouthwashes (MOUTH RINSE) LIQD solution 15 mLs by Mouth Rinse route 4 (four) times daily - after meals and at bedtime.   [DISCONTINUED] polyethylene glycol powder (GLYCOLAX/MIRALAX) 17 GM/SCOOP powder Take 1 capfull (17g) by mouth 2 (two) times daily.   No facility-administered encounter medications on file as of 02/04/2022.    ALLERGIES:  No Known Allergies   PHYSICAL EXAM:  ECOG Performance status: 1  Vitals:   02/04/22 1508  BP: (!) 141/63  Pulse: 91  Resp: 18  Temp: 97.6 F (36.4 C)  SpO2: 100%     Filed Weights   02/04/22 1508  Weight: 177 lb 14.4 oz (80.7 kg)    Physical Exam Vitals reviewed.  Constitutional:      Appearance: Normal appearance.  Cardiovascular:     Rate and Rhythm: Normal rate and regular rhythm.     Heart sounds: Normal heart sounds.  Pulmonary:     Effort: Pulmonary effort is normal.     Breath sounds: Normal breath sounds.  Abdominal:     Palpations: Abdomen is soft. There is no mass.  Neurological:     Mental Status: He is alert.  Psychiatric:        Mood and Affect: Mood normal.        Behavior: Behavior normal.      LABORATORY DATA:  I have reviewed the labs as listed.  CBC    Component Value Date/Time   WBC 5.6 01/28/2022 1124   RBC 3.80 (L) 01/28/2022 1124   HGB 12.3 (L) 01/28/2022 1124   HCT 37.6 (L) 01/28/2022 1124   PLT 252 01/28/2022 1124   MCV 98.9 01/28/2022 1124   MCH 32.4 01/28/2022 1124   MCHC 32.7 01/28/2022 1124   RDW 14.7 01/28/2022 1124   LYMPHSABS 1.6 11/18/2021 1413   MONOABS 1.0 11/18/2021 1413   EOSABS 0.2 11/18/2021 1413   BASOSABS 0.1 11/18/2021 1413      Latest Ref Rng & Units 11/18/2021    2:13 PM 11/03/2021    5:53 AM 10/29/2021    7:25 AM  CMP  Glucose 70 - 99 mg/dL 91  105  96   BUN 8 - 23 mg/dL '14  17  18   '$ Creatinine 0.61 - 1.24 mg/dL 0.83  0.85  0.72   Sodium 135 - 145 mmol/L 137  135  135   Potassium 3.5 - 5.1 mmol/L 4.2  4.2  4.1   Chloride 98 - 111 mmol/L 104  103  104   CO2 22 - 32 mmol/L '25  26  22   '$ Calcium 8.9 - 10.3 mg/dL 9.9  10.0  10.0   Total Protein 6.5 - 8.1 g/dL 7.6   6.3   Total Bilirubin 0.3 - 1.2 mg/dL 0.3   0.4   Alkaline Phos 38 - 126 U/L 57   52   AST 15 -  41 U/L 17   21   ALT 0 - 44 U/L 21   42     DIAGNOSTIC IMAGING:  I have independently reviewed the scans and discussed with the patient.  ASSESSMENT: 1.  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  Enlarged contralateral right submandibular node.  Enlargement of the left submandibular gland probably reactive.  No abscess. - Biopsy (07/18/2021) floor of the mouth: Invasive well to moderately differentiated keratinizing squamous cell carcinoma.  Tumor cells negative for p16. - 10/14/2021: Floor of the mouth resection, tracheostomy, bilateral selective neck dissections zones 1-3 by Dr. Redmond Baseman - Pathology: Invasive moderately differentiated keratinizing SCC, 4.1 cm, carcinoma invades for a depth of about 1.9 cm and involves saliva gland tissue.  Anterior, posterior, right, left resection margins are involved.  Deep resection margin is negative.  Metastatic carcinoma 1/3 level 1 lymph nodes.  3 level 2 and 3 level 3 lymph nodes negative for carcinoma.  0/10 lymph nodes involved in the left side neck zone 1, 2, 3 dissection.  ENE not identified.  LVI/perineural invasion not identified. - I have talked to pathologist.  Reexcision margins were considered negative.  He has a high risk feature which is T4 tumor. -  PET scan on 12/12/2021: Soft tissue thickening and calcification along the ventral aspect of the trachea with associated hypermetabolism corresponding to recent tracheostomy site.  7 mm right paratracheal lymph node new with SUV 2.4.  Hypermetabolism along the dome of the right hepatic lobe with probable 2.4 cm low-attenuation lesion - Liver lesion biopsy (01/28/2022): Metastatic squamous cell carcinoma, keratinizing.   2.  Social/family history: - He lives in his apartment by himself and is independent of ADLs and IADLs.  He does not drive.  He worked as a Dealer and several other jobs.  Quit smoking 1 month ago.  He smoked 1 pack/week for more than 50 years. - 1 brother had agent orange related cancer.  Another brother also had cancer, type unknown to the patient.   3.   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 - He was on close surveillance rather than adjuvant chemotherapy.   PLAN:   1.  Stage IV (PT4PN1 M1) SCC of the floor of the mouth, p16 negative: - PET scan on 12/12/2021 showed hypermetabolism along the dome of the right hepatic lobe. - MRI of the liver showed solitary 2.4 x 2.5 x 2.5 cm lesion - He had biopsy done on 01/28/2022. - We discussed pathology which showed metastatic squamous cell carcinoma. - I have recommended NGS testing. - If he has PD-L1 CPS positive, single agent Beryle Flock will be a good option for him based on his age and performance status. - I have discussed prognosis of metastatic head neck cancer in detail.  I have reviewed PET scan images with him and his family per his request. - I will see him back in 3 weeks for follow-up to discuss NGS results and further plan.   2.  Stage Ib pure seminoma: - PET scan on 12/12/2021 showed left testicle is mildly hypermetabolic SUV 3.6.  Recent tumor markers were negative. - Ultrasound scrotum on 01/02/2022 with no discrete mass.  Increased vascularity suggesting epididymoorchitis.  He does not have any clinical signs or symptoms.  Will continue to monitor.     Orders placed this encounter:  No orders of the defined types were placed in this encounter.     Nathaniel Jack, MD [place] [phone]

## 2022-02-04 NOTE — Patient Instructions (Addendum)
Nathaniel Hicks  Discharge Instructions  You were seen and examined today by Dr. Delton Coombes.  Dr. Delton Coombes discussed your most recent lab work and PET scan which revealed that the cancer has spread to the liver.  Dr. Delton Coombes is going to send the biopsy tissue off to do a NGS test to see if you can do immunotherapy. Immunotherapy would be Keytruda (Pembrolizumab) if the NGS test shows that it will help.  Follow-up as scheduled in 3 weeks.    Thank you for choosing [facility] to provide your oncology and hematology care.   To afford each patient quality time with our provider, please arrive at least 15 minutes before your scheduled appointment time. You may need to reschedule your appointment if you arrive late (10 or more minutes). Arriving late affects you and other patients whose appointments are after yours.  Also, if you miss three or more appointments without notifying the office, you may be dismissed from the clinic at the provider's discretion.    Again, thank you for choosing [facility].  Our hope is that these requests will decrease the amount of time that you wait before being seen by our physicians.   If you have a lab appointment with the [facility] please come in thru the Main Entrance and check in at the main information desk.           _____________________________________________________________  Should you have questions after your visit to [facility], please contact our office at [phone] and follow the prompts.  Our office hours are 8:00 a.m. to 4:30 p.m. Monday - Thursday and 8:00 a.m. to 2:30 p.m. Friday.  Please note that voicemails left after 4:00 p.m. may not be returned until the following business day.  We are closed weekends and all major holidays.  You do have access to a nurse 24-7, just call the main number to the clinic [phone] and do not press any options, hold on  the line and a nurse will answer the phone.    For prescription refill requests, have your pharmacy contact our office and allow 72 hours.    Masks are optional in the cancer centers. If you would like for your care team to wear a mask while they are taking care of you, please let them know. You may have one support person who is at least 82 years old accompany you for your appointments.

## 2022-02-05 DIAGNOSIS — Z930 Tracheostomy status: Secondary | ICD-10-CM | POA: Diagnosis not present

## 2022-02-07 DIAGNOSIS — Z930 Tracheostomy status: Secondary | ICD-10-CM | POA: Diagnosis not present

## 2022-02-23 DIAGNOSIS — R531 Weakness: Secondary | ICD-10-CM | POA: Diagnosis not present

## 2022-02-25 ENCOUNTER — Inpatient Hospital Stay: Payer: 59 | Admitting: Hematology

## 2022-02-25 DIAGNOSIS — C049 Malignant neoplasm of floor of mouth, unspecified: Secondary | ICD-10-CM | POA: Diagnosis not present

## 2022-02-25 NOTE — Progress Notes (Shared)
Nathaniel Hicks [address] Nathaniel Hicks [address]    Clinic Day:  02/25/2022  Referring physician: Celene Squibb, MD  Patient Care Team: Celene Squibb, MD as PCP - General (Internal Medicine) [name], MD as Consulting Physician (Urology) [name], MD as Medical Oncologist (Medical Oncology) Brien Mates, RN as Oncology Nurse Navigator (Medical Oncology)   ASSESSMENT & PLAN:   Assessment: 1.  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  Enlarged contralateral right submandibular node.  Enlargement of the left submandibular gland probably reactive.  No abscess. - Biopsy (07/18/2021) floor of the mouth: Invasive well to moderately differentiated keratinizing squamous cell carcinoma.  Tumor cells negative for p16. - 10/14/2021: Floor of the mouth resection, tracheostomy, bilateral selective neck dissections zones 1-3 by Dr. Redmond Baseman - Pathology: Invasive moderately differentiated keratinizing SCC, 4.1 cm, carcinoma invades for a depth of about 1.9 cm and involves saliva gland tissue.  Anterior, posterior, right, left resection margins are involved.  Deep resection margin is negative.  Metastatic carcinoma 1/3 level 1 lymph nodes.  3 level 2 and 3 level 3 lymph nodes negative for carcinoma.  0/10 lymph nodes involved in the left side neck zone 1, 2, 3 dissection.  ENE not identified.  LVI/perineural invasion not identified. - I have talked to pathologist.  Reexcision margins were considered negative.  He has a high risk feature which is T4 tumor. -  PET scan on 12/12/2021: Soft tissue thickening and calcification along the ventral aspect of the trachea with associated hypermetabolism corresponding to recent tracheostomy site.  7 mm right  paratracheal lymph node new with SUV 2.4.  Hypermetabolism along the dome of the right hepatic lobe with probable 2.4 cm low-attenuation lesion - Liver lesion biopsy (01/28/2022): Metastatic squamous cell carcinoma, keratinizing.   2.  Social/family history: - He lives in his apartment by himself and is independent of ADLs and IADLs.  He does not drive.  He worked as a Dealer and several other jobs.  Quit smoking 1 month ago.  He smoked 1 pack/week for more than 50 years. - 1 brother had agent orange related cancer.  Another brother also had cancer, type unknown to the patient.   3.  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 - He was on close surveillance rather than adjuvant chemotherapy.  Plan: 1.  Stage IV (PT4PN1 M1) SCC of the floor of the mouth, p16 negative: - PET scan on 12/12/2021 showed hypermetabolism along the dome of the right hepatic lobe. - MRI of the liver showed solitary 2.4 x 2.5 x 2.5 cm lesion - He had biopsy done on 01/28/2022. - We discussed pathology which showed metastatic squamous cell carcinoma. - I have recommended NGS testing. - If he has PD-L1 CPS positive, single agent Beryle Flock will be a good option for him based on his age and performance status. - I have discussed prognosis of metastatic head neck cancer in detail.  I have reviewed PET scan images with him and his family per his request. - I will see him back in 3 weeks for follow-up to discuss NGS results and further plan.   2.  Stage Ib pure seminoma: - PET scan on 12/12/2021 showed left testicle is mildly hypermetabolic SUV  3.6.  Recent tumor markers were negative. - Ultrasound scrotum on 01/02/2022 with no discrete mass.  Increased vascularity suggesting epididymoorchitis.  He does not have any clinical signs or symptoms.  Will continue to monitor.  No orders of the defined types were placed in this encounter.     Nathaniel Hicks,acting as a scribe for [name], MD.,have documented all relevant documentation on the behalf of [name], MD,as directed by  [name], MD while in the presence of [name], MD.   ***  [name]   2/14/20249:59 AM  CHIEF COMPLAINT:   Diagnosis: squamous cell carcinoma of the floor of the mouth and testicular seminoma    Cancer Staging  Cancer of floor of mouth [facility]) Staging form: Oral Cavity, AJCC 8th Edition - Clinical stage from 11/25/2021: Stage IVC (cT4a, cN1, pM1) - Signed by [name], MD on 02/04/2022  Testicle cancer, right Staging form: Testis, AJCC 7th Edition - Clinical: Stage I (T2, N0, M0) - Signed by Baird Cancer, PA-C on 07/23/2013    Prior Therapy: 1. Right radical orchiectomy on 12/20/2012 2.  Floor of the mouth resection, bilateral selective neck dissections zones 1-3 on 10/14/2021    Current Therapy:  Under workup     HISTORY OF PRESENT ILLNESS:   Oncology History  Testicle cancer, right  12/20/2012 Surgery   Right total orchiectomyt- 1. Testis, biopsy, right - SEMINOMA. PLEASE SEE COMMENT. 2. Testis, tumor, right - SEMINOMA, 7.5 CM. - ANGIOLYMPHATIC INVASION PRESENT. - RESECTION MARGINS, NEGATIVE FOR ATYPIA OR MALIGNANCY.   01/18/2013 PET scan   Status post right orchiectomy. No findings specific for metastatic disease. Small para-aortic nodes measuring up to 5 mm short axis, without convincing hypermetabolism. Given location, attention on follow-up is suggested.   04/17/2013 Imaging   CT CAP- No evidence of metastatic disease in the chest, abdomen or pelvis. Proximal LAD coronary artery calcification.   07/20/2013 Imaging   CT abd/pelvis- No evidence of metastatic disease in the abdomen or pelvis.   10/24/2013 Imaging   CT abd/pelvis- No findings to suggest metastatic disease in the abdomen or pelvis   10/24/2013 Imaging   Chest xray- Probable COPD.  Negative for metastatic disease.   02/21/2014 Imaging   CT abd/pelvis-  Stable abdominal pelvic CT status post right orchectomy. No evidence of adenopathy or other metastatic disease.   02/21/2014 Imaging   Chest xray- Left lower lobe mild atelectasis and/or infiltrate.     09/10/2014 Imaging   CT abd/pelvis- Status post right orchiectomy.   No evidence of metastatic disease.   3 mm nonobstructing right upper pole renal calculus. No hydronephrosis.   03/14/2015 Imaging   CT abd/pelvis- Stable exam. No evidence of metastatic disease or other acute findings within the abdomen or pelvis.   09/13/2015 Imaging   CT abd/pelvis- No acute findings and no evidence for mass or adenopathy.    04/24/2016 Imaging   CT abd/pelvis: IMPRESSION: 1. Stable exam. No new or progressive findings. No features to suggest metastatic disease.   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[place])  10/14/2021 Initial Diagnosis   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[place])   11/25/2021 Cancer Staging   Staging form: Oral Cavity, AJCC 8th Edition - Clinical stage from 11/25/2021: Stage IVC (cT4a, cN1, pM1) - Signed by [name], MD on 02/04/2022 Histopathologic type: Squamous cell carcinoma, NOS Stage prefix: Initial diagnosis Histologic grade (G): G2 Histologic grading system: 3 grade system      INTERVAL HISTORY:  Nathaniel Hicks is a 82 y.o. male presenting to clinic today for follow up of squamous cell carcinoma of the floor of the mouth and testicular seminoma. He was last seen by me on 02/04/2022.  Today, he states that he is doing well overall. His appetite level is at ***%. His energy level is at ***%. He denies any {oncneg:29081}.    PAST MEDICAL HISTORY:   Past Medical History: Past Medical History:  Diagnosis Date   Arthritis    BPH (benign prostatic hyperplasia)    Difficult intubation    HOH (hard of hearing)    Hyperlipidemia 11/02/2017   Hypertension    Hypertension 11/02/2017   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[place])    2014   Vitamin D deficiency 11/02/2017    Surgical History: Past Surgical  History:  Procedure Laterality Date   COLONOSCOPY N/A 11/24/2012   Procedure: COLONOSCOPY;  Surgeon: Rogene Houston, MD;  [facility];  Service: Endoscopy;  Laterality: N/A;  830-moved to [place] notified pt   FLOOR OF MOUTH BIOPSY N/A 10/14/2021   Procedure: FLOOR OF MOUTH RESECTION;  Surgeon: Melida Quitter, MD;  Location: [facility];  Service: ENT;  Laterality: N/A;   HEMORROIDECTOMY     KNEE ARTHROSCOPY WITH LATERAL MENISECTOMY Right 08/31/2017   Procedure: KNEE ARTHROSCOPY WITH LATERAL MENISECTOMY;  Surgeon: Carole Civil, MD;  Location: [facility];  Service: Orthopedics;  Laterality: Right;   LESION EXCISION N/A 03/23/2012   Procedure: EXCISION NEOPLASM SCALP ;  Surgeon: Jamesetta So, MD;  Location: [facility];  Service: General;  Laterality: N/A;  Excision of Scalp Neoplasm   ORCHIECTOMY Right 12/20/2012   Procedure: RIGHT RADICAL ORCHIECTOMY/POSSIBLE BX RIGHT TESTICLE;  Surgeon: Marissa Nestle, MD;  Location: [facility];  Service: Urology;  Laterality: Right;   PROSTATE SURGERY     RADICAL NECK DISSECTION Bilateral 10/14/2021   Procedure: NECK DISSECTION;  Surgeon: Melida Quitter, MD;  Location: [place];  Service: ENT;  Laterality: Bilateral;   SCALP LACERATION REPAIR     [facility]-Dr Tamala Julian   SKIN FULL THICKNESS GRAFT Bilateral 10/14/2021   Procedure: PLATYSMA FLAP CLOSURE;  Surgeon: Melida Quitter, MD;  Location: [facility];  Service: ENT;  Laterality: Bilateral;   TOOTH EXTRACTION  10/14/2021   Procedure: DENTAL EXTRACTIONS;  Surgeon: Melida Quitter, MD;  Location: [facility];  Service: ENT;;   TRACHEOSTOMY TUBE PLACEMENT N/A 10/14/2021   Procedure: TRACHEOSTOMY;  Surgeon: Melida Quitter, MD;  Location: [facility];  Service: ENT;  Laterality: N/A;    Social History: Social History   Socioeconomic History   Marital status: Single    Spouse name: Not on file   Number of children: Not on file   Years of education: Not on file   Highest education level: Not on file  Occupational History   Not on file   Tobacco Use   Smoking status: Former    Packs/day: 0.25    Years: 50.00    Total pack years: 12.50    Types: Cigarettes   Smokeless tobacco: Never  Vaping Use   Vaping Use: Never used  Substance and Sexual Activity   Alcohol use: Not Currently   Drug use: No   Sexual activity: Yes    Birth control/protection: None  Other Topics Concern   Not on file  Social History Narrative   Not on file   Social Determinants of Health   Financial Resource Strain: Not on file  Food Insecurity: Not on file  Transportation Needs: Not on file  Physical Activity: Not on file  Stress: Not on file  Social Connections: Not on file  Intimate Partner Violence: Not on file    Family History: Family History  Problem Relation Age of Onset   Cancer Brother    Cancer Brother     Current Medications:  Current Outpatient Medications:    amLODipine (NORVASC) 10 MG tablet, Take 1 tablet (10 mg total) by mouth daily., Disp: 300 tablet, Rfl: 0   carvedilol (COREG) 6.25 MG tablet, Take 1 tablet (6.25 mg total) by mouth 2 (two) times daily with a meal., Disp: 60 tablet, Rfl: 0   Cholecalciferol (VITAMIN D3) 50 MCG (2000 UT) TABS, Take 2,000 Units by mouth daily., Disp: , Rfl:    diclofenac Sodium (VOLTAREN) 1 % GEL, Apply 1 g topically daily as needed (Arthritis pain)., Disp: , Rfl:    hydrALAZINE (APRESOLINE) 25 MG tablet, Take 25 mg by mouth 2 (two) times daily., Disp: , Rfl:    Menthol, Topical Analgesic, (BIOFREEZE EX), Apply 1 application  topically daily as needed (pain)., Disp: , Rfl:    oxyCODONE (OXY IR/ROXICODONE) 5 MG immediate release tablet, Take 5 mg by mouth 3 (three) times daily., Disp: , Rfl:    pravastatin (PRAVACHOL) 80 MG tablet, Take 80 mg by mouth daily., Disp: , Rfl:    RAPAFLO 8 MG CAPS capsule, Take 8 mg by mouth daily. Silodosin, Disp: , Rfl:    vitamin B-12 (CYANOCOBALAMIN) 500 MCG tablet, Take 500 mcg by mouth daily., Disp: , Rfl:    Allergies: No Known Allergies  REVIEW  OF SYSTEMS:   Review of Systems - Oncology   VITALS:   There were no vitals taken for this visit.  Wt Readings from Last 3 Encounters:  02/04/22 177 lb 14.4 oz (80.7 kg)  01/28/22 175 lb (79.4 kg)  01/07/22 174 lb 8 oz (79.2 kg)    There is no height or weight on file to calculate BMI.  Performance status (ECOG): {CHL ONC [id number]  PHYSICAL EXAM:   Physical Exam  LABS:      Latest Ref Rng & Units 01/28/2022   11:24 AM 11/18/2021    2:13 PM 11/03/2021    5:53 AM  CBC  WBC 4.0 - 10.5 K/uL 5.6  6.6  7.2   Hemoglobin 13.0 - 17.0 g/dL 12.3  11.3  10.9   Hematocrit 39.0 - 52.0 % 37.6  35.1  33.1   Platelets 150 - 400 K/uL 252  289  420       Latest Ref Rng & Units 11/18/2021    2:13 PM 11/03/2021    5:53 AM 10/29/2021    7:25 AM  CMP  Glucose 70 - 99 mg/dL 91  105  96   BUN 8 - 23 mg/dL 14  17  18   $ Creatinine 0.61 - 1.24 mg/dL 0.83  0.85  0.72   Sodium 135 - 145 mmol/L 137  135  135   Potassium 3.5 - 5.1 mmol/L 4.2  4.2  4.1   Chloride 98 - 111 mmol/L 104  103  104   CO2 22 - 32 mmol/L 25  26  22   $ Calcium 8.9 - 10.3 mg/dL 9.9  10.0  10.0   Total Protein 6.5 - 8.1 g/dL 7.6   6.3   Total Bilirubin 0.3 - 1.2 mg/dL 0.3   0.4   Alkaline Phos 38 - 126 U/L 57   52   AST 15 - 41 U/L 17   21  ALT 0 - 44 U/L 21   42      Lab Results  Component Value Date   CEA 2.2 01/19/2013   /  CEA  Date Value Ref Range Status  01/19/2013 2.2 0.0 - 5.0 ng/mL Final    Comment:    Performed at [facility]   No results found for: "PSA1" No results found for: "CAN199" No results found for: "CAN125"  No results found for: "TOTALPROTELP", "ALBUMINELP", "A1GS", "A2GS", "BETS", "BETA2SER", "GAMS", "MSPIKE", "SPEI" No results found for: "TIBC", "FERRITIN", "IRONPCTSAT" Lab Results  Component Value Date   LDH 172 11/12/2020   LDH 175 10/26/2018   LDH 179 10/25/2013     STUDIES:   US BIOPSY (LIVER)  Result Date: 01/28/2022 INDICATION: 82 year old gentleman with  history of tongue and testicular malignancy presents to IR for biopsy of new right liver lesion. EXAM: US guided right liver lesion biopsy MEDICATIONS: None. ANESTHESIA/SEDATION: Moderate (conscious) sedation was employed during this procedure. A total of Versed 1.5 mg and Fentanyl 75 mcg was administered intravenously by the radiology nurse. Total intra-service moderate Sedation Time: 10 minutes. The patient's level of consciousness and vital signs were monitored continuously by radiology nursing throughout the procedure under my direct supervision. COMPLICATIONS: None immediate. PROCEDURE: Informed written consent was obtained from the patient after a thorough discussion of the procedural risks, benefits and alternatives. All questions were addressed. Maximal Sterile Barrier Technique was utilized including caps, mask, sterile gowns, sterile gloves, sterile drape, hand hygiene and skin antiseptic. A timeout was performed prior to the initiation of the procedure. Patient position supine on the ultrasound table. Right upper quadrant skin prepped and draped in usual sterile fashion. Following local lidocaine administration, 17 gauge introducer needle was advanced into the right liver lesion, and 4-18 gauge cores were obtained utilizing continuous ultrasound guidance. Gelfoam slurry was administered through the introducer needle at the biopsy site. Samples were sent to pathology in formalin. Needle removed and hemostasis achieved with 5 minutes of manual compression. Post procedure ultrasound images showed no evidence of significant hemorrhage. IMPRESSION: Successful ultrasound-guided biopsy of right liver lesion. Electronically Signed   By: Miachel Roux M.D.   On: 01/28/2022 14:30

## 2022-03-04 ENCOUNTER — Inpatient Hospital Stay: Payer: 59 | Attending: Hematology | Admitting: Hematology

## 2022-03-04 VITALS — BP 132/70 | HR 75 | Temp 98.1°F | Resp 18 | Ht 69.5 in | Wt 177.9 lb

## 2022-03-04 DIAGNOSIS — C6291 Malignant neoplasm of right testis, unspecified whether descended or undescended: Secondary | ICD-10-CM | POA: Insufficient documentation

## 2022-03-04 DIAGNOSIS — Z5112 Encounter for antineoplastic immunotherapy: Secondary | ICD-10-CM | POA: Insufficient documentation

## 2022-03-04 DIAGNOSIS — Z87891 Personal history of nicotine dependence: Secondary | ICD-10-CM | POA: Insufficient documentation

## 2022-03-04 DIAGNOSIS — Z79899 Other long term (current) drug therapy: Secondary | ICD-10-CM | POA: Insufficient documentation

## 2022-03-04 DIAGNOSIS — Z809 Family history of malignant neoplasm, unspecified: Secondary | ICD-10-CM | POA: Diagnosis not present

## 2022-03-04 DIAGNOSIS — C049 Malignant neoplasm of floor of mouth, unspecified: Secondary | ICD-10-CM | POA: Insufficient documentation

## 2022-03-04 DIAGNOSIS — I10 Essential (primary) hypertension: Secondary | ICD-10-CM | POA: Insufficient documentation

## 2022-03-04 NOTE — Patient Instructions (Signed)
[facility]  Discharge Instructions  You were seen and examined today by Dr. Delton Coombes.  Dr. Delton Coombes has reviewed the results of your NGS testing. It revealed that you would benefit from a medication known as Keytruda.  This is an immunotherapy (NOT Chemotherapy) drug given here in the [facility] once every 3 weeks. It is generally well tolerated.   Because the cancer has spread from the head/neck region to the liver, it is considered Stage IV and cannot be cured. You will be on some form of treatment to keep the cancer under control for the remainder of your life.  Prior to the start of treatment, you will meet with our chemotherapy/immunotherapy educator, Ebony Hail, and she will discuss Keytruda in detail.  We will also arrange for you to have a Port-A-Cath placed here in [place] with one of our surgeons at [facility].  Follow-up as scheduled.  Thank you for choosing [facility] to provide your oncology and hematology care.   To afford each patient quality time with our provider, please arrive at least 15 minutes before your scheduled appointment time. You may need to reschedule your appointment if you arrive late (10 or more minutes). Arriving late affects you and other patients whose appointments are after yours.  Also, if you miss three or more appointments without notifying the office, you may be dismissed from the clinic at the provider's discretion.    Again, thank you for choosing [facility].  Our hope is that these requests will decrease the amount of time that you wait before being seen by our physicians.   If you have a lab appointment with the [facility] please come in thru the Main Entrance and check in at the main information desk.           _____________________________________________________________  Should you have questions after your visit to [facility],  please contact our office at [phone] and follow the prompts.  Our office hours are 8:00 a.m. to 4:30 p.m. Monday - Thursday and 8:00 a.m. to 2:30 p.m. Friday.  Please note that voicemails left after 4:00 p.m. may not be returned until the following business day.  We are closed weekends and all major holidays.  You do have access to a nurse 24-7, just call the main number to the clinic [phone] and do not press any options, hold on the line and a nurse will answer the phone.    For prescription refill requests, have your pharmacy contact our office and allow 72 hours.    Masks are optional in the cancer centers. If you would like for your care team to wear a mask while they are taking care of you, please let them know. You may have one support person who is at least 82 years old accompany you for your appointments.

## 2022-03-04 NOTE — Progress Notes (Signed)
Nathaniel Hicks [address], [place] [address]    Clinic Day:  03/04/2022  Referring physician: Celene Squibb, MD  Patient Care Team: Celene Squibb, MD as PCP - General (Internal Medicine) [name], MD as Consulting Physician (Urology) [name], MD as Medical Oncologist (Medical Oncology) Brien Mates, RN as Oncology Nurse Navigator (Medical Oncology)   ASSESSMENT & PLAN:   Assessment: 1.  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  Enlarged contralateral right submandibular node.  Enlargement of the left submandibular gland probably reactive.  No abscess. - Biopsy (07/18/2021) floor of the mouth: Invasive well to moderately differentiated keratinizing squamous cell carcinoma.  Tumor cells negative for p16. - 10/14/2021: Floor of the mouth resection, tracheostomy, bilateral selective neck dissections zones 1-3 by Dr. Redmond Baseman - Pathology: Invasive moderately differentiated keratinizing SCC, 4.1 cm, carcinoma invades for a depth of about 1.9 cm and involves saliva gland tissue.  Anterior, posterior, right, left resection margins are involved.  Deep resection margin is negative.  Metastatic carcinoma 1/3 level 1 lymph nodes.  3 level 2 and 3 level 3 lymph nodes negative for carcinoma.  0/10 lymph nodes involved in the left side neck zone 1, 2, 3 dissection.  ENE not identified.  LVI/perineural invasion not identified. - I have talked to pathologist.  Reexcision margins were considered negative.  He has a high risk feature which is T4 tumor. -  PET scan on 12/12/2021: Soft tissue thickening and calcification along the ventral aspect of the trachea with associated hypermetabolism corresponding to recent tracheostomy site.  7 mm right  paratracheal lymph node new with SUV 2.4.  Hypermetabolism along the dome of the right hepatic lobe with probable 2.4 cm low-attenuation lesion - Liver lesion biopsy (01/28/2022): Metastatic squamous cell carcinoma, keratinizing. - NGS testing: PD-L1 (22 C3): CPS: 100, CD274 (PD-L1) amplified, JAK2 amplified, PIK3CA pathogenic variant exon 10, T p53 pathogenic variant, MS-stable, TMB-low   2.  Social/family history: - He lives in his apartment by himself and is independent of ADLs and IADLs.  He does not drive.  He worked as a Dealer and several other jobs.  Quit smoking 1 month ago.  He smoked 1 pack/week for more than 50 years. - 1 brother had agent orange related cancer.  Another brother also had cancer, type unknown to the patient.   3.  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 - He was on close surveillance rather than adjuvant chemotherapy.    Plan: 1.  Stage IV (PT4PN1 M1) SCC of the floor of the mouth, p16 negative: - We have reviewed NGS results which showed PD-L1 CPS was 100%. - We discussed single agent pembrolizumab in the setting. - We discussed side effects in detail including rare chance of colitis, pneumonitis, hypophysitis among others.  We discussed common side effects including dry skin, fatigue, thyroid abnormalities. - He would like to proceed with Keytruda.  We will plan to rescan him after 3-4 cycles. - We also talked about port placement.  We will likely start his Keytruda first cycle without port next Tuesday.  We will get the port arranged locally. - RTC prior to cycle 2.   2.  Stage Ib pure seminoma: - PET scan on 12/12/2021 showed  left testicle is mildly hypermetabolic with SUV 3.6.  Tumor markers negative. - Ultrasound scrotum on 01/02/2022: No discrete mass.  Increased vascularity suggesting epididymoorchitis.  Clinically he does not have any symptoms.  Orders Placed This Encounter  Procedures   CBC with Differential     Standing Status:   Future    Standing Expiration Date:   03/11/2023   Comprehensive metabolic panel    Standing Status:   Future    Standing Expiration Date:   03/11/2023   T4    Standing Status:   Future    Standing Expiration Date:   03/11/2023   TSH    Standing Status:   Future    Standing Expiration Date:   03/11/2023   CBC with Differential    Standing Status:   Future    Standing Expiration Date:   04/01/2023   Comprehensive metabolic panel    Standing Status:   Future    Standing Expiration Date:   04/01/2023   CBC with Differential    Standing Status:   Future    Standing Expiration Date:   04/22/2023   Comprehensive metabolic panel    Standing Status:   Future    Standing Expiration Date:   04/22/2023   T4    Standing Status:   Future    Standing Expiration Date:   04/22/2023   TSH    Standing Status:   Future    Standing Expiration Date:   04/22/2023   CBC with Differential    Standing Status:   Future    Standing Expiration Date:   05/13/2023   Comprehensive metabolic panel    Standing Status:   Future    Standing Expiration Date:   05/13/2023   CBC with Differential    Standing Status:   Future    Standing Expiration Date:   06/03/2023   Comprehensive metabolic panel    Standing Status:   Future    Standing Expiration Date:   06/03/2023   CBC with Differential    Standing Status:   Future    Standing Expiration Date:   06/24/2023   Comprehensive metabolic panel    Standing Status:   Future    Standing Expiration Date:   06/24/2023   T4    Standing Status:   Future    Standing Expiration Date:   06/24/2023   TSH    Standing Status:   Future    Standing Expiration Date:   06/24/2023      Beverly Gust Oliver,acting as a scribe for [name], MD.,have documented all relevant documentation on the behalf of [name], MD,as directed by  [name], MD while in the presence of [name], MD.   I, [name] MD, have reviewed  the above documentation for accuracy and completeness, and I agree with the above.   [name], MD   2/21/20246:48 PM  CHIEF COMPLAINT:   Diagnosis: squamous cell carcinoma of the floor of the mouth and testicular seminoma    Cancer Staging  Cancer of floor of mouth [facility]) Staging form: Oral Cavity, AJCC 8th Edition - Clinical stage from 11/25/2021: Stage IVC (cT4a, cN1, pM1) - Signed by [name], MD on 02/04/2022  Testicle cancer, right Staging form: Testis, AJCC 7th Edition - Clinical: Stage I (T2, N0, M0) - Signed by Baird Cancer, PA-C on 07/23/2013    Prior Therapy: None  Current Therapy: Pembrolizumab   HISTORY OF PRESENT ILLNESS:   Oncology History  Testicle cancer,  right  12/20/2012 Surgery   Right total orchiectomyt- 1. Testis, biopsy, right - SEMINOMA. PLEASE SEE COMMENT. 2. Testis, tumor, right - SEMINOMA, 7.5 CM. - ANGIOLYMPHATIC INVASION PRESENT. - RESECTION MARGINS, NEGATIVE FOR ATYPIA OR MALIGNANCY.   01/18/2013 PET scan   Status post right orchiectomy. No findings specific for metastatic disease. Small para-aortic nodes measuring up to 5 mm short axis, without convincing hypermetabolism. Given location, attention on follow-up is suggested.   04/17/2013 Imaging   CT CAP- No evidence of metastatic disease in the chest, abdomen or pelvis. Proximal LAD coronary artery calcification.   07/20/2013 Imaging   CT abd/pelvis- No evidence of metastatic disease in the abdomen or pelvis.   10/24/2013 Imaging   CT abd/pelvis- No findings to suggest metastatic disease in the abdomen or pelvis   10/24/2013 Imaging   Chest xray- Probable COPD.  Negative for metastatic disease.   02/21/2014 Imaging   CT abd/pelvis- Stable abdominal pelvic CT status post right orchectomy. No evidence of adenopathy or other metastatic disease.   02/21/2014 Imaging   Chest xray- Left lower lobe mild atelectasis and/or infiltrate.     09/10/2014 Imaging   CT abd/pelvis-  Status post right orchiectomy.   No evidence of metastatic disease.   3 mm nonobstructing right upper pole renal calculus. No hydronephrosis.   03/14/2015 Imaging   CT abd/pelvis- Stable exam. No evidence of metastatic disease or other acute findings within the abdomen or pelvis.   09/13/2015 Imaging   CT abd/pelvis- No acute findings and no evidence for mass or adenopathy.    04/24/2016 Imaging   CT abd/pelvis: IMPRESSION: 1. Stable exam. No new or progressive findings. No features to suggest metastatic disease.   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[place])  10/14/2021 Initial Diagnosis   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[place])   11/25/2021 Cancer Staging   Staging form: Oral Cavity, AJCC 8th Edition - Clinical stage from 11/25/2021: Stage IVC (cT4a, cN1, pM1) - Signed by [name], MD on 02/04/2022 Histopathologic type: Squamous cell carcinoma, NOS Stage prefix: Initial diagnosis Histologic grade (G): G2 Histologic grading system: 3 grade system   03/10/2022 -  Chemotherapy   Patient is on Treatment Plan : HEAD/NECK Pembrolizumab (200) q21d        INTERVAL HISTORY:   Nathaniel Hicks is a 82 y.o. male presenting to clinic today for follow up of squamous cell carcinoma of the floor of the mouth and testicular seminoma . He was last seen by me on 02/04/2022.  Today, he states that he is doing well overall. His appetite level is at 85%. His energy level is at 70%. He denies having a port but states he previously had a trachea tube. He denies any immune diseases.    PAST MEDICAL HISTORY:   Past Medical History: Past Medical History:  Diagnosis Date   Arthritis    BPH (benign prostatic hyperplasia)    Difficult intubation    HOH (hard of hearing)    Hyperlipidemia 11/02/2017   Hypertension    Hypertension 11/02/2017   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[place])    2014   Vitamin D deficiency 11/02/2017    Surgical History: Past Surgical History:  Procedure Laterality Date   COLONOSCOPY N/A 11/24/2012    Procedure: COLONOSCOPY;  Surgeon: Rogene Houston, MD;  Location: [facility];  Service: Endoscopy;  Laterality: N/A;  830-moved to [place] notified pt   FLOOR OF MOUTH BIOPSY N/A 10/14/2021   Procedure: FLOOR OF MOUTH RESECTION;  Surgeon: Melida Quitter, MD;  Location: [facility] OR;  Service: ENT;  Laterality: N/A;   HEMORROIDECTOMY     KNEE ARTHROSCOPY WITH LATERAL MENISECTOMY Right 08/31/2017   Procedure: KNEE ARTHROSCOPY WITH LATERAL MENISECTOMY;  Surgeon: Carole Civil, MD;  Location: [facility];  Service: Orthopedics;  Laterality: Right;   LESION EXCISION N/A 03/23/2012   Procedure: EXCISION NEOPLASM SCALP ;  Surgeon: Jamesetta So, MD;  Location: [facility];  Service: General;  Laterality: N/A;  Excision of Scalp Neoplasm   ORCHIECTOMY Right 12/20/2012   Procedure: RIGHT RADICAL ORCHIECTOMY/POSSIBLE BX RIGHT TESTICLE;  Surgeon: Marissa Nestle, MD;  Location: [facility];  Service: Urology;  Laterality: Right;   PROSTATE SURGERY     RADICAL NECK DISSECTION Bilateral 10/14/2021   Procedure: NECK DISSECTION;  Surgeon: Melida Quitter, MD;  Location: [place];  Service: ENT;  Laterality: Bilateral;   SCALP LACERATION REPAIR     [facility]-Dr Tamala Julian   SKIN FULL THICKNESS GRAFT Bilateral 10/14/2021   Procedure: PLATYSMA FLAP CLOSURE;  Surgeon: Melida Quitter, MD;  Location: [place];  Service: ENT;  Laterality: Bilateral;   TOOTH EXTRACTION  10/14/2021   Procedure: DENTAL EXTRACTIONS;  Surgeon: Melida Quitter, MD;  Location: [place];  Service: ENT;;   TRACHEOSTOMY TUBE PLACEMENT N/A 10/14/2021   Procedure: TRACHEOSTOMY;  Surgeon: Melida Quitter, MD;  Location: [place];  Service: ENT;  Laterality: N/A;    Social History: Social History   Socioeconomic History   Marital status: Single    Spouse name: Not on file   Number of children: Not on file   Years of education: Not on file   Highest education level: Not on file  Occupational History   Not on file  Tobacco Use   Smoking status: Former    Packs/day: 0.25    Years:  50.00    Total pack years: 12.50    Types: Cigarettes   Smokeless tobacco: Never  Vaping Use   Vaping Use: Never used  Substance and Sexual Activity   Alcohol use: Not Currently   Drug use: No   Sexual activity: Yes    Birth control/protection: None  Other Topics Concern   Not on file  Social History Narrative   Not on file   Social Determinants of Health   Financial Resource Strain: Not on file  Food Insecurity: Not on file  Transportation Needs: Not on file  Physical Activity: Not on file  Stress: Not on file  Social Connections: Not on file  Intimate Partner Violence: Not on file    Family History: Family History  Problem Relation Age of Onset   Cancer Brother    Cancer Brother     Current Medications:  Current Outpatient Medications:    amLODipine (NORVASC) 10 MG tablet, Take 1 tablet (10 mg total) by mouth daily., Disp: 300 tablet, Rfl: 0   carvedilol (COREG) 6.25 MG tablet, Take 1 tablet (6.25 mg total) by mouth 2 (two) times daily with a meal., Disp: 60 tablet, Rfl: 0   Cholecalciferol (VITAMIN D3) 50 MCG (2000 UT) TABS, Take 2,000 Units by mouth daily., Disp: , Rfl:    diclofenac Sodium (VOLTAREN) 1 % GEL, Apply 1 g topically daily as needed (Arthritis pain)., Disp: , Rfl:    hydrALAZINE (APRESOLINE) 25 MG tablet, Take 25 mg by mouth 2 (two) times daily., Disp: , Rfl:    Menthol, Topical Analgesic, (BIOFREEZE EX), Apply 1 application  topically daily as needed (pain)., Disp: , Rfl:    oxyCODONE (OXY IR/ROXICODONE) 5 MG immediate release  tablet, Take 5 mg by mouth 3 (three) times daily., Disp: , Rfl:    pravastatin (PRAVACHOL) 80 MG tablet, Take 80 mg by mouth daily., Disp: , Rfl:    RAPAFLO 8 MG CAPS capsule, Take 8 mg by mouth daily. Silodosin, Disp: , Rfl:    vitamin B-12 (CYANOCOBALAMIN) 500 MCG tablet, Take 500 mcg by mouth daily., Disp: , Rfl:    Allergies: No Known Allergies  REVIEW OF SYSTEMS:   Review of Systems  Constitutional:  Negative for  chills, fatigue and fever.  HENT:   Positive for mouth sores. Negative for lump/mass, nosebleeds, sore throat and trouble swallowing. Hearing loss: No teeth.  Eyes:  Negative for eye problems.  Respiratory:  Negative for cough and shortness of breath.   Cardiovascular:  Negative for chest pain, leg swelling and palpitations.  Gastrointestinal:  Negative for abdominal pain, constipation, diarrhea, nausea and vomiting.  Genitourinary:  Negative for bladder incontinence, difficulty urinating, dysuria, frequency, hematuria and nocturia.   Musculoskeletal:  Negative for arthralgias, back pain, flank pain, myalgias and neck pain.  Skin:  Negative for itching and rash.  Neurological:  Negative for dizziness, headaches and numbness.  Hematological:  Does not bruise/bleed easily.  Psychiatric/Behavioral:  Negative for depression, sleep disturbance and suicidal ideas. The patient is not nervous/anxious.   All other systems reviewed and are negative.    VITALS:   Blood pressure 132/70, pulse 75, temperature 98.1 F (36.7 C), temperature source Oral, resp. rate 18, height 5' 9.5" (1.765 m), weight 177 lb 14.4 oz (80.7 kg), SpO2 98 %.  Wt Readings from Last 3 Encounters:  03/04/22 177 lb 14.4 oz (80.7 kg)  02/04/22 177 lb 14.4 oz (80.7 kg)  01/28/22 175 lb (79.4 kg)    Body mass index is 25.89 kg/m.  Performance status (ECOG): 1 - Symptomatic but completely ambulatory  PHYSICAL EXAM:   Physical Exam Vitals and nursing note reviewed. Exam conducted with a chaperone present.  Constitutional:      Appearance: Normal appearance.  Cardiovascular:     Rate and Rhythm: Normal rate and regular rhythm.     Pulses: Normal pulses.     Heart sounds: Normal heart sounds.  Pulmonary:     Effort: Pulmonary effort is normal.     Breath sounds: Normal breath sounds.  Abdominal:     Palpations: Abdomen is soft. There is no hepatomegaly, splenomegaly or mass.     Tenderness: There is no abdominal  tenderness.  Musculoskeletal:     Right lower leg: No edema.     Left lower leg: No edema.  Lymphadenopathy:     Cervical: No cervical adenopathy.     Right cervical: No superficial, deep or posterior cervical adenopathy.    Left cervical: No superficial, deep or posterior cervical adenopathy.     Upper Body:     Right upper body: No supraclavicular or axillary adenopathy.     Left upper body: No supraclavicular or axillary adenopathy.  Neurological:     General: No focal deficit present.     Mental Status: He is alert and oriented to person, place, and time.  Psychiatric:        Mood and Affect: Mood normal.        Behavior: Behavior normal.     LABS:      Latest Ref Rng & Units 01/28/2022   11:24 AM 11/18/2021    2:13 PM 11/03/2021    5:53 AM  CBC  WBC 4.0 - 10.5 K/uL 5.6  6.6  7.2   Hemoglobin 13.0 - 17.0 g/dL 12.3  11.3  10.9   Hematocrit 39.0 - 52.0 % 37.6  35.1  33.1   Platelets 150 - 400 K/uL 252  289  420       Latest Ref Rng & Units 11/18/2021    2:13 PM 11/03/2021    5:53 AM 10/29/2021    7:25 AM  CMP  Glucose 70 - 99 mg/dL 91  105  96   BUN 8 - 23 mg/dL 14  17  18   $ Creatinine 0.61 - 1.24 mg/dL 0.83  0.85  0.72   Sodium 135 - 145 mmol/L 137  135  135   Potassium 3.5 - 5.1 mmol/L 4.2  4.2  4.1   Chloride 98 - 111 mmol/L 104  103  104   CO2 22 - 32 mmol/L 25  26  22   $ Calcium 8.9 - 10.3 mg/dL 9.9  10.0  10.0   Total Protein 6.5 - 8.1 g/dL 7.6   6.3   Total Bilirubin 0.3 - 1.2 mg/dL 0.3   0.4   Alkaline Phos 38 - 126 U/L 57   52   AST 15 - 41 U/L 17   21   ALT 0 - 44 U/L 21   42      Lab Results  Component Value Date   CEA 2.2 01/19/2013   /  CEA  Date Value Ref Range Status  01/19/2013 2.2 0.0 - 5.0 ng/mL Final    Comment:    Performed at [facility]   No results found for: "PSA1" No results found for: "CAN199" No results found for: "CAN125"  No results found for: "TOTALPROTELP", "ALBUMINELP", "A1GS", "A2GS", "BETS", "BETA2SER",  "GAMS", "MSPIKE", "SPEI" No results found for: "TIBC", "FERRITIN", "IRONPCTSAT" Lab Results  Component Value Date   LDH 172 11/12/2020   LDH 175 10/26/2018   LDH 179 10/25/2013     STUDIES:   No results found.

## 2022-03-04 NOTE — Progress Notes (Signed)
START ON PATHWAY REGIMEN - Head and Neck     A cycle is every 21 days:     Pembrolizumab   **Always confirm dose/schedule in your pharmacy ordering system**  Patient Characteristics: Oral Cavity, Metastatic, First Line, No Prior Platinum-Based Chemoradiation within 6 Months, PD-L1 Expression Positive (CPS ? 1) Disease Classification: Oral Cavity AJCC T Category: T4a AJCC M Category: M1 AJCC N Category: pN1 AJCC 8 Stage Grouping: IVC Therapeutic Status: Metastatic Disease Line of Therapy: First Line Prior Platinum Status: No Prior Platinum-Based Chemoradiation within 6 Months PD-L1 Expression Status: PD-L1 Positive (CPS ? 1) Intent of Therapy: Non-Curative / Palliative Intent, Discussed with Patient

## 2022-03-05 ENCOUNTER — Encounter: Payer: Self-pay | Admitting: *Deleted

## 2022-03-05 ENCOUNTER — Other Ambulatory Visit: Payer: Self-pay

## 2022-03-05 NOTE — Patient Instructions (Addendum)
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 You will be treated in the clinic one day every 3 weeks with an immunotherapy drug called pembrolizumab Beryle Flock). The intent of treatment is to control your disease, prevent it from spreading further, and to alleviate any symptoms you may be having related to the cancer. You will see the doctor regularly throughout treatment.  We will obtain blood work from you prior to every treatment and monitor your results to make sure it is safe to give your treatment. The doctor monitors your response to treatment by the way you are feeling, your blood work, and by obtaining scans periodically.  There will be wait times while you are here for treatment.  It will take about 30 minutes to 1 hour for your lab work to result. Then there will be wait times while pharmacy mixes your medications.    Pembrolizumab Beryle Flock)  About This Drug Pembrolizumab is used to treat cancer. It is given in the vein (IV).  This drug will take 30 minutes to infuse.  Possible Side Effects  Tiredness  Fever  Nausea  Decreased appetite (decreased hunger)  Loose bowel movements (diarrhea)  Constipation (not able to move bowels)  Trouble breathing  Rash  Itching  Muscle and bone pain  Cough  Note: Each of the side effects above was reported in 20% or greater of patients treated with pembrolizumab. Not all possible side effects are included above.  Warnings and Precautions   This drug works with your immune system and can cause inflammation in any of your organs and tissues and can change how they work. This may put you at risk for developing serious medical problems which can very rarely be fatal.   Colitis (swelling or inflammation in the colon) - symptoms are loose bowel movements (diarrhea) stomach cramping, and sometimes blood in the stool   Changes in liver function. Your liver  function will be checked as needed.   Changes in kidney function, which can very rarely be fatal. Your kidney function will be checked as needed.   Inflammation (swelling) of the lungs which can very rarely be fatal - you may have a dry cough or trouble breathing.   This drug may affect some of your hormone glands (especially the thyroid, adrenals, pituitary and pancreas). Your hormone levels will be checked as needed.   Blood sugar levels may change and you may develop diabetes. If you already have diabetes, changes may need to be made to your diabetes medication.   Severe allergic skin reaction, which can very rarely be fatal. You may develop blisters on your skin that are filled with fluid or a severe red rash all over your body that may be painful.   Increased risk of organ rejection in patients who have received donor organs   Increased risk of complications in patients who will undergo a stem cell transplant after receiving pembrolizumab.   While you are getting this drug in your vein (IV), you may have a reaction to the drug. Your nurse will check you closely for these signs: fever or shaking chills, flushing, facial swelling, feeling dizzy, headache, trouble breathing, rash, itching, chest tightness, or chest pain. These reactions may occur after your infusion. If this happens, call 911 for emergency care.  Important Information This drug may be present in the saliva, tears, sweat, urine, stool, vomit, semen, and vaginal secretions. Talk to your doctor  and/or your nurse about the necessary precautions to take during this time.  Treating Side Effects   Ask your doctor or nurse about medicines that are available to help stop or lessen constipation, diarrhea and/or nausea.   Drink plenty of fluids (a minimum of eight glasses per day is recommended).   If you are not able to move your bowels, check with your doctor or nurse before you use any enemas, laxatives, or suppositories   To  help with nausea and vomiting, eat small, frequent meals instead of three large meals a day. Choose foods and drinks that are at room temperature. Ask your nurse or doctor about other helpful tips and medicine that is available to help or stop lessen these symptoms.   If you get diarrhea, eat low-fiber foods that are high in protein and calories and avoid foods that can irritate your digestive tracts or lead to cramping. Ask your nurse or doctor about medicine that can lessen or stop your diarrhea.   Manage tiredness by pacing your activities for the day. Be sure to include periods of rest between energy-draining activities   Keeping your pain under control is important to your wellbeing. Please tell your doctor or nurse if you are experiencing pain.   If you have diabetes, keep good control of your blood sugar level. Tell your nurse or your doctor if your glucose levels are higher or lower than normal   If you get a rash do not put anything on it unless your doctor or nurse says you may. Keep the area around the rash clean and dry. Ask your doctor for medicine if your rash bothers you.   Infusion reactions may happen for 24 hours after your infusion. If this happens, call 911 for emergency care.  Food and Drug Interactions   There are no known interactions of pembrolizumab with food.   There are no known interactions of pembrolizumab with other medications.   Tell your doctor and pharmacist about all the medicines and dietary supplements (vitamins, minerals, herbs and others) that you are taking at this time. The safety and use of dietary supplements and alternative agents are often not known. Using these might affect your cancer or interfere with your treatment. Until more is known, you should not use dietary supplements or alternative agents without your cancer doctor's help.   When to Call the Doctor Call your doctor or nurse if you have any of the following symptoms and/or any new or  unusual symptoms:   Fever of 100.4 F (38 C) or higher   Chills   Wheezing or trouble breathing   Rash or itching   Feeling dizzy or lightheaded   Loose bowel movements (diarrhea) more than 4 times a day or diarrhea with weakness or lightheadedness, or diarrhea that is not controlled by medications   Nausea that stops you from eating or drinking, and/or that is not relieved by prescribed medicines   Lasting loss of appetite or rapid weight loss of five pounds in a week   Fatigue that interferes with your daily activities   No bowel movement for 3 days or you feel uncomfortable   Extreme weakness that interferes with normal activities   Bad abdominal pain, especially in upper right area   Decreased urine   Unusual thirst or passing urine often   Rash that is not relieved by prescribed medicines   Flu-like symptoms: fever, headache, muscle and joint aches, and fatigue (low energy, feeling weak)   Signs of  liver problems: dark urine, pale bowel movements, bad stomach pain, feeling very tired and weak, unusual itching, or yellowing of the eyes or skin   Signs of infusion reactions such as fever or shaking chills, flushing, facial swelling, feeling dizzy, headache, trouble breathing, rash, itching, chest tightness, or chest pain.   Reproduction Warnings   Pregnancy warning: This drug may have harmful effects on the unborn baby. Women of childbearing potential should use effective methods of birth control during your cancer treatment and for at least 4 months after treatment. Let your doctor know right away if you think you may be pregnant   Breast feeding warning: It is not known if this drug passes into breast milk. It is recommended that women do not breastfeed during treatment and for 4 months after treatment.   Fertility warning: Human fertility studies have not been done with this drug. Talk with your doctor or nurse if you plan to have children.   SELF CARE ACTIVITIES  WHILE ON CHEMOTHERAPY/IMMUNOTHERAPY:  Hydration Increase your fluid intake 48 hours prior to treatment and drink at least 8 to 12 cups (64 ounces) of water/decaffeinated beverages per day after treatment. You can still have your cup of coffee or soda but these beverages do not count as part of your 8 to 12 cups that you need to drink daily. No alcohol intake.  Medications Continue taking your normal prescription medication as prescribed.  If you start any new herbal or new supplements please let us know first to make sure it is safe.  Mouth Care Have teeth cleaned professionally before starting treatment. Keep dentures and partial plates clean. Use soft toothbrush and do not use mouthwashes that contain alcohol. Biotene is a good mouthwash that is available at most pharmacies or may be ordered by calling [phone]. Use warm salt water gargles (1 teaspoon salt per 1 quart warm water) before and after meals and at bedtime. Or you may rinse with 2 tablespoons of three-percent hydrogen peroxide mixed in eight ounces of water. If you are still having problems with your mouth or sores in your mouth please call the clinic. If you need dental work, please let the doctor know before you go for your appointment so that we can coordinate the best possible time for you in regards to your chemo regimen. You need to also let your dentist know that you are actively taking chemo. We may need to do labs prior to your dental appointment.  Skin Care Always use sunscreen that has not expired and with SPF (Sun Protection Factor) of 50 or higher. Wear hats to protect your head from the sun. Remember to use sunscreen on your hands, ears, face, & feet.  Use good moisturizing lotions such as udder cream, eucerin, or even Vaseline. Some chemotherapies can cause dry skin, color changes in your skin and nails.    Avoid long, hot showers or baths. Use gentle, fragrance-free soaps and laundry detergent. Use moisturizers,  preferably creams or ointments rather than lotions because the thicker consistency is better at preventing skin dehydration. Apply the cream or ointment within 15 minutes of showering. Reapply moisturizer at night, and moisturize your hands every time after you wash them.   Infection Prevention Please wash your hands for at least 30 seconds using warm soapy water. Handwashing is the #1 way to prevent the spread of germs. Stay away from sick people or people who are getting over a cold. If you develop respiratory systems such as green/yellow mucus production  or productive cough or persistent cough let us know and we will see if you need an antibiotic. It is a good idea to keep a pair of gloves on when going into grocery stores/[employer] to decrease your risk of coming into contact with germs on the carts, etc. Carry alcohol hand gel with you at all times and use it frequently if out in public. If your temperature reaches 100.5 or higher please call the clinic and let us know.  If it is after hours or on the weekend please go to the ER if your temperature is over 100.4.  Please have your own personal thermometer at home to use.    Sex and bodily fluids If you are going to have sex, a condom must be used to protect the person that isn't taking immunotherapy. For a few days after treatment, immunotherapy can be excreted through your bodily fluids.  When using the toilet please close the lid and flush the toilet twice.  Do this for a few day after you have had immunotherapy.   Contraception It is not known for sure whether or not immunotherapy drugs can be passed on through semen or secretions from the vagina. Because of this some doctors advise people to use a barrier method if you have sex during treatment. This applies to vaginal, anal or oral sex.  Generally, doctors advise a barrier method only for the time you are actually having the treatment and for about a week after your treatment.  Advice like this  can be worrying, but this does not mean that you have to avoid being intimate with your partner. You can still have close contact with your partner and continue to enjoy sex.  Animals If you have cats or birds we just ask that you not change the litter or change the cage.  Please have someone else do this for you while you are on immunotherapy.   Food Safety During and After Cancer Treatment Food safety is important for people both during and after cancer treatment. Cancer and cancer treatments, such as chemotherapy, radiation therapy, and stem cell/bone marrow transplantation, often weaken the immune system. This makes it harder for your body to protect itself from foodborne illness, also called food poisoning. Foodborne illness is caused by eating food that contains harmful bacteria, parasites, or viruses.  Foods to avoid Some foods have a higher risk of becoming tainted with bacteria. These include: Unwashed fresh fruit and vegetables, especially leafy vegetables that can hide dirt and other contaminants Raw sprouts, such as alfalfa sprouts Raw or undercooked beef, especially ground beef, or other raw or undercooked meat and poultry Fatty, fried, or spicy foods immediately before or after treatment.  These can sit heavy on your stomach and make you feel nauseous. Raw or undercooked shellfish, such as oysters. Sushi and sashimi, which often contain raw fish.  Unpasteurized beverages, such as unpasteurized fruit juices, raw milk, raw yogurt, or cider Undercooked eggs, such as soft boiled, over easy, and poached; raw, unpasteurized eggs; or foods made with raw egg, such as homemade raw cookie dough and homemade mayonnaise  Simple steps for food safety  Shop smart. Do not buy food stored or displayed in an unclean area. Do not buy bruised or damaged fruits or vegetables. Do not buy cans that have cracks, dents, or bulges. Pick up foods that can spoil at the end of your shopping trip and store  them in a cooler on the way home.  Prepare and clean up  foods carefully. Rinse all fresh fruits and vegetables under running water, and dry them with a clean towel or paper towel. Clean the top of cans before opening them. After preparing food, wash your hands for 20 seconds with hot water and soap. Pay special attention to areas between fingers and under nails. Clean your utensils and dishes with hot water and soap. Disinfect your kitchen and cutting boards using 1 teaspoon of liquid, unscented bleach mixed into 1 quart of water.    Dispose of old food. Eat canned and packaged food before its expiration date (the "use by" or "best before" date). Consume refrigerated leftovers within 3 to 4 days. After that time, throw out the food. Even if the food does not smell or look spoiled, it still may be unsafe. Some bacteria, such as Listeria, can grow even on foods stored in the refrigerator if they are kept for too long.  Take precautions when eating out. At restaurants, avoid buffets and salad bars where food sits out for a long time and comes in contact with many people. Food can become contaminated when someone with a virus, often a norovirus, or another "bug" handles it. Put any leftover food in a "to-go" container yourself, rather than having the server do it. And, refrigerate leftovers as soon as you get home. Choose restaurants that are clean and that are willing to prepare your food as you order it cooked.    SYMPTOMS TO REPORT AS SOON AS POSSIBLE AFTER TREATMENT:  FEVER GREATER THAN 100.4 F  CHILLS WITH OR WITHOUT FEVER  NAUSEA AND VOMITING THAT IS NOT CONTROLLED WITH YOUR NAUSEA MEDICATION  UNUSUAL SHORTNESS OF BREATH  UNUSUAL BRUISING OR BLEEDING  TENDERNESS IN MOUTH AND THROAT WITH OR WITHOUT PRESENCE OF ULCERS  URINARY PROBLEMS  BOWEL PROBLEMS  UNUSUAL RASH     Wear comfortable clothing and clothing appropriate for easy access to any Portacath or PICC line. Let us  know if there is anything that we can do to make your therapy better!   What to do if you need assistance after hours or on the weekends: CALL [phone].  HOLD on the line, do not hang up.  You will hear multiple messages but at the end you will be connected with a nurse triage line.  They will contact the doctor if necessary.  Most of the time they will be able to assist you.  Do not call the hospital operator.    I have been informed and understand all of the instructions given to me and have received a copy. I have been instructed to call the clinic [phone] or my family physician as soon as possible for continued medical care, if indicated. I do not have any more questions at this time but understand that I may call the [facility] or the Patient Navigator at [phone] during office hours should I have questions or need assistance in obtaining follow-up care.

## 2022-03-05 NOTE — Progress Notes (Signed)
This encounter was created in error - please disregard.

## 2022-03-05 NOTE — Progress Notes (Signed)
Pharmacist Chemotherapy Monitoring - Initial Assessment    Anticipated start date: 03/10/22   The following has been reviewed per standard work regarding the patient's treatment regimen: The patient's diagnosis, treatment plan and drug doses, and organ/hematologic function Lab orders and baseline tests specific to treatment regimen  The treatment plan start date, drug sequencing, and pre-medications Prior authorization status  Patient's documented medication list, including drug-drug interaction screen and prescriptions for anti-emetics and supportive care specific to the treatment regimen The drug concentrations, fluid compatibility, administration routes, and timing of the medications to be used The patient's access for treatment and lifetime cumulative dose history, if applicable  The patient's medication allergies and previous infusion related reactions, if applicable   Changes made to treatment plan:  N/A  Follow up needed:  N/A   Wynona Neat, [facility], 03/05/2022  12:00 PM

## 2022-03-06 ENCOUNTER — Other Ambulatory Visit: Payer: Self-pay

## 2022-03-08 ENCOUNTER — Other Ambulatory Visit: Payer: Self-pay

## 2022-03-08 DIAGNOSIS — Z930 Tracheostomy status: Secondary | ICD-10-CM | POA: Diagnosis not present

## 2022-03-09 ENCOUNTER — Inpatient Hospital Stay: Payer: 59

## 2022-03-09 DIAGNOSIS — C049 Malignant neoplasm of floor of mouth, unspecified: Secondary | ICD-10-CM

## 2022-03-09 NOTE — Progress Notes (Signed)
Immunotherapy education packet given and discussed with pt and family in detail.  Discussed diagnosis, staging, tx regimen, and intent of tx.  Reviewed immunotherapy medications and side effects.  Instructed on how to manage side effects at home, and when to call the clinic.  Importance of fever/chills discussed with pt and family. Discussed precautions to implement at home after receiving tx, as well as self care strategies. Phone numbers provided for clinic during regular working hours, also how to reach the clinic after hours and on weekends. Pt and family provided the opportunity to ask questions - all questions answered to pt's satisfaction.

## 2022-03-10 ENCOUNTER — Inpatient Hospital Stay: Payer: 59

## 2022-03-10 VITALS — BP 131/59 | HR 72 | Temp 97.3°F | Resp 18

## 2022-03-10 DIAGNOSIS — C049 Malignant neoplasm of floor of mouth, unspecified: Secondary | ICD-10-CM

## 2022-03-10 DIAGNOSIS — Z79899 Other long term (current) drug therapy: Secondary | ICD-10-CM | POA: Diagnosis not present

## 2022-03-10 DIAGNOSIS — Z5112 Encounter for antineoplastic immunotherapy: Secondary | ICD-10-CM | POA: Diagnosis not present

## 2022-03-10 DIAGNOSIS — Z87891 Personal history of nicotine dependence: Secondary | ICD-10-CM | POA: Diagnosis not present

## 2022-03-10 DIAGNOSIS — I10 Essential (primary) hypertension: Secondary | ICD-10-CM | POA: Diagnosis not present

## 2022-03-10 DIAGNOSIS — Z809 Family history of malignant neoplasm, unspecified: Secondary | ICD-10-CM | POA: Diagnosis not present

## 2022-03-10 DIAGNOSIS — Z930 Tracheostomy status: Secondary | ICD-10-CM | POA: Diagnosis not present

## 2022-03-10 LAB — COMPREHENSIVE METABOLIC PANEL
ALT: 10 U/L (ref 0–44)
AST: 16 U/L (ref 15–41)
Albumin: 3.7 g/dL (ref 3.5–5.0)
Alkaline Phosphatase: 60 U/L (ref 38–126)
Anion gap: 6 (ref 5–15)
BUN: 17 mg/dL (ref 8–23)
CO2: 26 mmol/L (ref 22–32)
Calcium: 9.8 mg/dL (ref 8.9–10.3)
Chloride: 107 mmol/L (ref 98–111)
Creatinine, Ser: 0.93 mg/dL (ref 0.61–1.24)
GFR, Estimated: >60 mL/min (ref >60)
Glucose, Bld: 110 mg/dL — ABNORMAL HIGH (ref 70–99)
Potassium: 4.4 mmol/L (ref 3.5–5.1)
Sodium: 139 mmol/L (ref 135–145)
Total Bilirubin: 0.6 mg/dL (ref 0.3–1.2)
Total Protein: 7.8 g/dL (ref 6.5–8.1)

## 2022-03-10 LAB — CBC WITH DIFFERENTIAL/PLATELET
Abs Immature Granulocytes: 0.02 10*3/uL (ref 0.00–0.07)
Basophils Absolute: 0.1 10*3/uL (ref 0.0–0.1)
Basophils Relative: 1 %
Eosinophils Absolute: 0.2 10*3/uL (ref 0.0–0.5)
Eosinophils Relative: 3 %
HCT: 39.4 % (ref 39.0–52.0)
Hemoglobin: 12.4 g/dL — ABNORMAL LOW (ref 13.0–17.0)
Immature Granulocytes: 0 %
Lymphocytes Relative: 28 %
Lymphs Abs: 2 10*3/uL (ref 0.7–4.0)
MCH: 32 pg (ref 26.0–34.0)
MCHC: 31.5 g/dL (ref 30.0–36.0)
MCV: 101.8 fL — ABNORMAL HIGH (ref 80.0–100.0)
Monocytes Absolute: 0.8 10*3/uL (ref 0.1–1.0)
Monocytes Relative: 11 %
Neutro Abs: 4.1 10*3/uL (ref 1.7–7.7)
Neutrophils Relative %: 57 %
Platelets: 275 10*3/uL (ref 150–400)
RBC: 3.87 MIL/uL — ABNORMAL LOW (ref 4.22–5.81)
RDW: 14 % (ref 11.5–15.5)
WBC: 7.2 10*3/uL (ref 4.0–10.5)
nRBC: 0 % (ref 0.0–0.2)

## 2022-03-10 LAB — MAGNESIUM: Magnesium: 2.1 mg/dL (ref 1.7–2.4)

## 2022-03-10 LAB — TSH: TSH: 2.268 u[IU]/mL (ref 0.350–4.500)

## 2022-03-10 MED ORDER — SODIUM CHLORIDE 0.9 % IV SOLN
200.0000 mg | Freq: Once | INTRAVENOUS | Status: AC
  Administered 2022-03-10: 200 mg via INTRAVENOUS
  Filled 2022-03-10: qty 8

## 2022-03-10 MED ORDER — SODIUM CHLORIDE 0.9 % IV SOLN: Freq: Once | INTRAVENOUS | Status: AC

## 2022-03-10 NOTE — Patient Instructions (Signed)
[place]  Discharge Instructions: Thank you for choosing [facility] to provide your oncology and hematology care.  If you have a lab appointment with the [facility], please come in thru the Main Entrance and check in at the main information desk.  Wear comfortable clothing and clothing appropriate for easy access to any Portacath or PICC line.   We strive to give you quality time with your provider. You may need to reschedule your appointment if you arrive late (15 or more minutes).  Arriving late affects you and other patients whose appointments are after yours.  Also, if you miss three or more appointments without notifying the office, you may be dismissed from the clinic at the provider's discretion.      For prescription refill requests, have your pharmacy contact our office and allow 72 hours for refills to be completed.    Today you received the following chemotherapy and/or immunotherapy agents Keytruda   To help prevent nausea and vomiting after your treatment, we encourage you to take your nausea medication as directed.  Pembrolizumab Injection What is this medication? PEMBROLIZUMAB (PEM broe LIZ ue mab) treats some types of cancer. It works by helping your immune system slow or stop the spread of cancer cells. It is a monoclonal antibody. This medicine may be used for other purposes; ask your health care provider or pharmacist if you have questions. COMMON BRAND NAME(S): Keytruda What should I tell my care team before I take this medication? They need to know if you have any of these conditions: Allogeneic stem cell transplant (uses someone else's stem cells) Autoimmune diseases, such as Crohn disease, ulcerative colitis, lupus History of chest radiation Nervous system problems, such as Guillain-Barre syndrome, myasthenia gravis Organ transplant An unusual or allergic reaction to pembrolizumab, other medications, foods, dyes, or  preservatives Pregnant or trying to get pregnant Breast-feeding How should I use this medication? This medication is injected into a vein. It is given by your care team in a hospital or clinic setting. A special MedGuide will be given to you before each treatment. Be sure to read this information carefully each time. Talk to your care team about the use of this medication in children. While it may be prescribed for children as young as 6 months for selected conditions, precautions do apply. Overdosage: If you think you have taken too much of this medicine contact a poison control center or emergency room at once. NOTE: This medicine is only for you. Do not share this medicine with others. What if I miss a dose? Keep appointments for follow-up doses. It is important not to miss your dose. Call your care team if you are unable to keep an appointment. What may interact with this medication? Interactions have not been studied. This list may not describe all possible interactions. Give your health care provider a list of all the medicines, herbs, non-prescription drugs, or dietary supplements you use. Also tell them if you smoke, drink alcohol, or use illegal drugs. Some items may interact with your medicine. What should I watch for while using this medication? Your condition will be monitored carefully while you are receiving this medication. You may need blood work while taking this medication. This medication may cause serious skin reactions. They can happen weeks to months after starting the medication. Contact your care team right away if you notice fevers or flu-like symptoms with a rash. The rash may be red or purple and then turn into  blisters or peeling of the skin. You may also notice a red rash with swelling of the face, lips, or lymph nodes in your neck or under your arms. Tell your care team right away if you have any change in your eyesight. Talk to your care team if you may be pregnant.  Serious birth defects can occur if you take this medication during pregnancy and for 4 months after the last dose. You will need a negative pregnancy test before starting this medication. Contraception is recommended while taking this medication and for 4 months after the last dose. Your care team can help you find the option that works for you. Do not breastfeed while taking this medication and for 4 months after the last dose. What side effects may I notice from receiving this medication? Side effects that you should report to your care team as soon as possible: Allergic reactions--skin rash, itching, hives, swelling of the face, lips, tongue, or throat Dry cough, shortness of breath or trouble breathing Eye pain, redness, irritation, or discharge with blurry or decreased vision Heart muscle inflammation--unusual weakness or fatigue, shortness of breath, chest pain, fast or irregular heartbeat, dizziness, swelling of the ankles, feet, or hands Hormone gland problems--headache, sensitivity to light, unusual weakness or fatigue, dizziness, fast or irregular heartbeat, increased sensitivity to cold or heat, excessive sweating, constipation, hair loss, increased thirst or amount of urine, tremors or shaking, irritability Infusion reactions--chest pain, shortness of breath or trouble breathing, feeling faint or lightheaded Kidney injury (glomerulonephritis)--decrease in the amount of urine, red or dark brown urine, foamy or bubbly urine, swelling of the ankles, hands, or feet Liver injury--right upper belly pain, loss of appetite, nausea, light-colored stool, dark yellow or brown urine, yellowing skin or eyes, unusual weakness or fatigue Pain, tingling, or numbness in the hands or feet, muscle weakness, change in vision, confusion or trouble speaking, loss of balance or coordination, trouble walking, seizures Rash, fever, and swollen lymph nodes Redness, blistering, peeling, or loosening of the skin,  including inside the mouth Sudden or severe stomach pain, bloody diarrhea, fever, nausea, vomiting Side effects that usually do not require medical attention (report to your care team if they continue or are bothersome): Bone, joint, or muscle pain Diarrhea Fatigue Loss of appetite Nausea Skin rash This list may not describe all possible side effects. Call your doctor for medical advice about side effects. You may report side effects to FDA at 1-800-FDA-1088. Where should I keep my medication? This medication is given in a hospital or clinic. It will not be stored at home. NOTE: This sheet is a summary. It may not cover all possible information. If you have questions about this medicine, talk to your doctor, pharmacist, or health care provider.  2023 Elsevier/Gold Standard (2021-05-13 00:00:00)   BELOW ARE SYMPTOMS THAT SHOULD BE REPORTED IMMEDIATELY: *FEVER GREATER THAN 100.4 F (38 C) OR HIGHER *CHILLS OR SWEATING *NAUSEA AND VOMITING THAT IS NOT CONTROLLED WITH YOUR NAUSEA MEDICATION *UNUSUAL SHORTNESS OF BREATH *UNUSUAL BRUISING OR BLEEDING *URINARY PROBLEMS (pain or burning when urinating, or frequent urination) *BOWEL PROBLEMS (unusual diarrhea, constipation, pain near the anus) TENDERNESS IN MOUTH AND THROAT WITH OR WITHOUT PRESENCE OF ULCERS (sore throat, sores in mouth, or a toothache) UNUSUAL RASH, SWELLING OR PAIN  UNUSUAL VAGINAL DISCHARGE OR ITCHING   Items with * indicate a potential emergency and should be followed up as soon as possible or go to the Emergency Department if any problems should occur.  Please show the CHEMOTHERAPY  ALERT CARD or IMMUNOTHERAPY ALERT CARD at check-in to the Emergency Department and triage nurse.  Should you have questions after your visit or need to cancel or reschedule your appointment, please contact [facility] [phone]  and follow the prompts.  Office hours are 8:00 a.m. to 4:30 p.m. Monday - Friday. Please  note that voicemails left after 4:00 p.m. may not be returned until the following business day.  We are closed weekends and major holidays. You have access to a nurse at all times for urgent questions. Please call the main number to the clinic [phone] and follow the prompts.  For any non-urgent questions, you may also contact your provider using MyChart. We now offer e-Visits for anyone 18 and older to request care online for non-urgent symptoms. For details visit [email or web address].   Also download the MyChart app! Go to the app store, search "MyChart", open the app, select [facility], and log in with your MyChart username and password.

## 2022-03-10 NOTE — Progress Notes (Signed)
Pt presents today for C1D1 Keytruda per provider's order. Vital signs and labs WNL for treatment. Okay to proceed with treatment today.  C1D1 Keytruda given today per MD orders. Tolerated infusion without adverse affects. Vital signs stable. No complaints at this time. Discharged from clinic ambulatory in stable condition. Alert and oriented x 3. F/U with [facility] as scheduled.

## 2022-03-11 ENCOUNTER — Encounter: Payer: Self-pay | Admitting: *Deleted

## 2022-03-11 NOTE — Progress Notes (Signed)
States that he feels well overall. No side effects to note. Educated on side effects to look for and when to report them to the clinic. Provided the 24 hour nurse line phone number and encouraged to call 24/7 if he has any questions or concerns. He verbalizes understanding.

## 2022-03-11 NOTE — Progress Notes (Incomplete)
24 hour,

## 2022-03-12 ENCOUNTER — Inpatient Hospital Stay: Payer: 59 | Admitting: Licensed Clinical Social Worker

## 2022-03-12 DIAGNOSIS — C049 Malignant neoplasm of floor of mouth, unspecified: Secondary | ICD-10-CM

## 2022-03-12 LAB — T4: T4, Total: 4.7 ug/dL (ref 4.5–12.0)

## 2022-03-12 NOTE — Progress Notes (Signed)
[facility]  Initial Assessment   Nathaniel Hicks is a 82 y.o. year old male contacted by phone. Clinical Social Work was referred by medical provider for assessment of psychosocial needs.   SDOH (Social Determinants of Health) assessments performed: Yes   SDOH Screenings   Tobacco Use: Medium Risk (02/04/2022)     Distress Screen completed: No     No data to display            Family/Social Information:  Housing Arrangement: patient lives alone but has a HHA a couple of hours a day, 5 days a week through [employer] Family members/support persons in your life? Pt has 2 sisters who are available to assist pt as needed Transportation concerns: pt does not drive, but pt's sisters will take turns bringing him to appointments  Employment: Retired worked as a Dealer.  Income source: Paediatric nurse concerns: No Type of concern: None Food access concerns: no Religious or spiritual practice: Hydrographic surveyor Currently in place:  HHA for a couple of hours a day/5 days a week  Coping/ Adjustment to diagnosis: Patient understands treatment plan and what happens next? yes Concerns about diagnosis and/or treatment: How will I care for myself and Quality of life Patient reported stressors: Adjusting to my illness and Physical issues Hopes and/or priorities: Pt's priority is to start treatment w/ the hopes of positive results Patient enjoys  not addressed Current coping skills/ strengths: Capable of independent living  and Motivation for treatment/growth     SUMMARY: Current SDOH Barriers:  No barriers identified at this time  Clinical Social Work Clinical Goal(s):  No clinical social work goals at this time  Interventions: Discussed common feeling and emotions when being diagnosed with cancer, and the importance of support during treatment Informed patient of the support team roles and support services at [facility] Provided [facility] contact  information and encouraged patient to call with any questions or concerns Referred patient to Duanne [facility] and informed about the [facility] should financial concerns arise   Follow Up Plan: Patient will contact CSW with any support or resource needs Patient verbalizes understanding of plan: Yes    Henriette Combs, LCSW   Patient is participating in a Managed Medicaid Plan:  Yes

## 2022-03-14 ENCOUNTER — Other Ambulatory Visit: Payer: Self-pay

## 2022-03-15 ENCOUNTER — Other Ambulatory Visit: Payer: Self-pay

## 2022-03-16 ENCOUNTER — Other Ambulatory Visit (HOSPITAL_COMMUNITY): Payer: Self-pay

## 2022-03-16 ENCOUNTER — Encounter: Payer: 59 | Admitting: Dietician

## 2022-03-16 DIAGNOSIS — C049 Malignant neoplasm of floor of mouth, unspecified: Secondary | ICD-10-CM | POA: Diagnosis not present

## 2022-03-16 DIAGNOSIS — C787 Secondary malignant neoplasm of liver and intrahepatic bile duct: Secondary | ICD-10-CM | POA: Diagnosis not present

## 2022-03-16 MED ORDER — HYDROCODONE-ACETAMINOPHEN 7.5-325 MG/15ML PO SOLN
ORAL | 0 refills | Status: DC
  Filled 2022-03-16: qty 250, 4d supply, fill #0

## 2022-03-17 ENCOUNTER — Ambulatory Visit (INDEPENDENT_AMBULATORY_CARE_PROVIDER_SITE_OTHER): Payer: 59 | Admitting: General Surgery

## 2022-03-17 ENCOUNTER — Encounter: Payer: Self-pay | Admitting: General Surgery

## 2022-03-17 VITALS — BP 138/73 | HR 87 | Temp 98.7°F | Resp 16 | Ht 69.5 in | Wt 177.0 lb

## 2022-03-17 DIAGNOSIS — C049 Malignant neoplasm of floor of mouth, unspecified: Secondary | ICD-10-CM

## 2022-03-17 NOTE — Progress Notes (Signed)
Nathaniel Hicks; [id number]; [birth year]   HPI Patient is an 82 year old black male who was referred to my care by Dr. Delton Coombes of oncology for Port-A-Cath placement.  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  He needs central venous access. 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   Vitamin D deficiency 11/02/2017    Past Surgical History:  Procedure Laterality Date   COLONOSCOPY N/A 11/24/2012   Procedure: COLONOSCOPY;  Surgeon: Rogene Houston, MD;  [facility];  Service: Endoscopy;  Laterality: N/A;  830-moved to [place] notified pt   FLOOR OF MOUTH BIOPSY N/A 10/14/2021   Procedure: FLOOR OF MOUTH RESECTION;  Surgeon: Melida Quitter, MD;  Location: [facility];  Service: ENT;  Laterality: N/A;   HEMORROIDECTOMY     KNEE ARTHROSCOPY WITH LATERAL MENISECTOMY Right 08/31/2017   Procedure: KNEE ARTHROSCOPY WITH LATERAL MENISECTOMY;  Surgeon: Carole Civil, MD;  [facility];  Service: Orthopedics;  Laterality: Right;   LESION EXCISION N/A 03/23/2012   Procedure: EXCISION NEOPLASM SCALP ;  Surgeon: Jamesetta So, MD;  Location: [facility];  Service: General;  Laterality: N/A;  Excision of Scalp Neoplasm   ORCHIECTOMY Right 12/20/2012   Procedure: RIGHT RADICAL ORCHIECTOMY/POSSIBLE BX RIGHT TESTICLE;  Surgeon: Marissa Nestle, MD;  [facility];  Service: Urology;  Laterality: Right;   PROSTATE SURGERY     RADICAL NECK DISSECTION Bilateral 10/14/2021   Procedure: NECK DISSECTION;  Surgeon: Melida Quitter, MD;  Location: [facility];  Service: ENT;  Laterality: Bilateral;   SCALP LACERATION REPAIR     [facility]-Dr Tamala Julian   SKIN FULL THICKNESS GRAFT Bilateral 10/14/2021   Procedure: PLATYSMA FLAP CLOSURE;  Surgeon: Melida Quitter, MD;  Location: [facility];  Service: ENT;  Laterality: Bilateral;   TOOTH EXTRACTION  10/14/2021   Procedure: DENTAL EXTRACTIONS;  Surgeon: Melida Quitter, MD;  Location: [facility];  Service: ENT;;   TRACHEOSTOMY TUBE PLACEMENT N/A 10/14/2021   Procedure: TRACHEOSTOMY;  Surgeon: Melida Quitter, MD;  Location[facility];  Service: ENT;  Laterality: N/A;    Family History  Problem Relation Age of Onset   Cancer Brother    Cancer Brother     Current Outpatient Medications on File Prior to Visit  Medication Sig Dispense Refill   amLODipine (NORVASC) 10 MG tablet Take 1 tablet (10 mg total) by mouth daily. 300 tablet 0   amLODipine-benazepril (LOTREL) 10-40 MG capsule Take 1 capsule by mouth daily.     carvedilol (COREG) 6.25 MG tablet Take 1 tablet (6.25 mg total) by mouth 2 (two) times daily with a meal. 60 tablet 0   Cholecalciferol (VITAMIN D3) 50 MCG (2000 UT) TABS Take 2,000 Units by mouth daily.     diclofenac Sodium (VOLTAREN) 1 % GEL Apply 1 g topically daily as needed (Arthritis pain).     gabapentin (NEURONTIN) 300 MG capsule Take by mouth.     hydrALAZINE (APRESOLINE) 25 MG tablet Take 25 mg by mouth 2 (two) times daily.     HYDROcodone-acetaminophen (HYCET) 7.5-325 mg/15 ml solution Take 15 mL by mouth every 6 hours as needed for moderate pain (4 - 6). 250 mL 0   magnesium oxide (MAG-OX)  400 MG tablet Take by mouth.     Menthol, Topical Analgesic, (BIOFREEZE EX) Apply 1 application  topically daily as needed (pain).     oxyCODONE (OXY IR/ROXICODONE) 5 MG immediate release tablet Take 5 mg by mouth 3 (three) times daily.     PEMBROLIZUMAB IV Inject into the vein every 21 ( twenty-one) days.     pravastatin (PRAVACHOL) 80 MG tablet Take 80 mg by mouth daily.     RAPAFLO 8 MG CAPS capsule Take 8 mg by mouth daily. Silodosin     vitamin B-12 (CYANOCOBALAMIN) 500 MCG tablet Take 500 mcg by mouth daily.     No current facility-administered medications on file prior to visit.    No Known Allergies  Social History    Substance and Sexual Activity  Alcohol Use Not Currently    Social History   Tobacco Use  Smoking Status Former   Packs/day: 0.25   Years: 50.00   Total pack years: 12.50   Types: Cigarettes  Smokeless Tobacco Never    Review of Systems  Constitutional: Negative.   HENT: Negative.    Eyes: Negative.   Respiratory: Negative.    Cardiovascular: Negative.   Gastrointestinal: Negative.   Genitourinary: Negative.   Musculoskeletal: Negative.   Skin: Negative.   Neurological: Negative.   Endo/Heme/Allergies: Negative.   Psychiatric/Behavioral: Negative.      Objective   Vitals:   03/17/22 1515  BP: 138/73  Pulse: 87  Resp: 16  Temp: 98.7 F (37.1 C)  SpO2: 91%    Physical Exam Vitals reviewed.  Constitutional:      Appearance: Normal appearance. He is normal weight. He is not ill-appearing.  HENT:     Head: Normocephalic and atraumatic.  Cardiovascular:     Rate and Rhythm: Normal rate and regular rhythm.     Heart sounds: Normal heart sounds. No murmur heard.    No friction rub. No gallop.  Pulmonary:     Effort: Pulmonary effort is normal. No respiratory distress.     Breath sounds: Normal breath sounds. No stridor. No wheezing, rhonchi or rales.  Abdominal:     General: There is no distension.  Skin:    General: Skin is warm and dry.  Neurological:     Mental Status: He is alert and oriented to person, place, and time.   Dr. 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  The risks and benefits of the procedure including bleeding, infection, and pneumothorax were fully explained to the patient, who gave informed consent.

## 2022-03-19 ENCOUNTER — Inpatient Hospital Stay: Payer: 59 | Attending: Hematology | Admitting: Dietician

## 2022-03-19 ENCOUNTER — Inpatient Hospital Stay: Payer: 59 | Admitting: Licensed Clinical Social Worker

## 2022-03-19 DIAGNOSIS — C049 Malignant neoplasm of floor of mouth, unspecified: Secondary | ICD-10-CM

## 2022-03-19 DIAGNOSIS — R7301 Impaired fasting glucose: Secondary | ICD-10-CM | POA: Diagnosis not present

## 2022-03-19 DIAGNOSIS — Z8547 Personal history of malignant neoplasm of testis: Secondary | ICD-10-CM | POA: Insufficient documentation

## 2022-03-19 DIAGNOSIS — E559 Vitamin D deficiency, unspecified: Secondary | ICD-10-CM | POA: Diagnosis not present

## 2022-03-19 DIAGNOSIS — C787 Secondary malignant neoplasm of liver and intrahepatic bile duct: Secondary | ICD-10-CM | POA: Insufficient documentation

## 2022-03-19 DIAGNOSIS — Z9079 Acquired absence of other genital organ(s): Secondary | ICD-10-CM | POA: Insufficient documentation

## 2022-03-19 DIAGNOSIS — E782 Mixed hyperlipidemia: Secondary | ICD-10-CM | POA: Diagnosis not present

## 2022-03-19 DIAGNOSIS — Z87891 Personal history of nicotine dependence: Secondary | ICD-10-CM | POA: Insufficient documentation

## 2022-03-19 DIAGNOSIS — Z5112 Encounter for antineoplastic immunotherapy: Secondary | ICD-10-CM | POA: Insufficient documentation

## 2022-03-19 NOTE — Progress Notes (Signed)
 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 S/p resection. He is currently receiving Keytruda q21d.   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 Left VM with contact information.

## 2022-03-19 NOTE — H&P (Signed)
Nathaniel Hicks; [id number]; 08/08/1940   HPI Patient is an 82 year old black male who was referred to my care by Dr. Delton Coombes of oncology for Port-A-Cath placement.  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  He needs central venous access. 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   Vitamin D deficiency 11/02/2017    Past Surgical History:  Procedure Laterality Date   COLONOSCOPY N/A 11/24/2012   Procedure: COLONOSCOPY;  Surgeon: Rogene Houston, MD;  [facility];  Service: Endoscopy;  Laterality: N/A;  830-moved to [place] notified pt   FLOOR OF MOUTH BIOPSY N/A 10/14/2021   Procedure: FLOOR OF MOUTH RESECTION;  Surgeon: Melida Quitter, MD;  Location: [facility];  Service: ENT;  Laterality: N/A;   HEMORROIDECTOMY     KNEE ARTHROSCOPY WITH LATERAL MENISECTOMY Right 08/31/2017   Procedure: KNEE ARTHROSCOPY WITH LATERAL MENISECTOMY;  Surgeon: Carole Civil, MD;  [facility];  Service: Orthopedics;  Laterality: Right;   LESION EXCISION N/A 03/23/2012   Procedure: EXCISION NEOPLASM SCALP ;  Surgeon: Jamesetta So, MD;  [facility];  Service: General;  Laterality: N/A;  Excision of Scalp Neoplasm   ORCHIECTOMY Right 12/20/2012   Procedure: RIGHT RADICAL ORCHIECTOMY/POSSIBLE BX RIGHT TESTICLE;  Surgeon: Marissa Nestle, MD;  [facility];  Service: Urology;  Laterality: Right;   PROSTATE SURGERY     RADICAL NECK DISSECTION Bilateral 10/14/2021   Procedure: NECK DISSECTION;  Surgeon: Melida Quitter, MD;  Location: [facility];  Service: ENT;  Laterality: Bilateral;   SCALP LACERATION REPAIR     [facility]-Dr Tamala Julian   SKIN FULL THICKNESS GRAFT Bilateral 10/14/2021   Procedure: PLATYSMA FLAP CLOSURE;  Surgeon: Melida Quitter, MD;  Location: [facility];  Service: ENT;  Laterality: Bilateral;   TOOTH EXTRACTION  10/14/2021   Procedure: DENTAL EXTRACTIONS;  Surgeon: Melida Quitter, MD;  Location: [place];  Service: ENT;;   TRACHEOSTOMY TUBE PLACEMENT N/A 10/14/2021   Procedure: TRACHEOSTOMY;  Surgeon: Melida Quitter, MD;  [facility];  Service: ENT;  Laterality: N/A;    Family History  Problem Relation Age of Onset   Cancer Brother    Cancer Brother     Current Outpatient Medications on File Prior to Visit  Medication Sig Dispense Refill   amLODipine (NORVASC) 10 MG tablet Take 1 tablet (10 mg total) by mouth daily. 300 tablet 0   amLODipine-benazepril (LOTREL) 10-40 MG capsule Take 1 capsule by mouth daily.     carvedilol (COREG) 6.25 MG tablet Take 1 tablet (6.25 mg total) by mouth 2 (two) times daily with a meal. 60 tablet 0   Cholecalciferol (VITAMIN D3) 50 MCG (2000 UT) TABS Take 2,000 Units by mouth daily.     diclofenac Sodium (VOLTAREN) 1 % GEL Apply 1 g topically daily as needed (Arthritis pain).     gabapentin (NEURONTIN) 300 MG capsule Take by mouth.     hydrALAZINE (APRESOLINE) 25 MG tablet Take 25 mg by mouth 2 (two) times daily.     HYDROcodone-acetaminophen (HYCET) 7.5-325 mg/15 ml solution Take 15 mL by mouth every 6 hours as needed for moderate pain (4 - 6). 250 mL 0   magnesium oxide (MAG-OX)  400 MG tablet Take by mouth.     Menthol, Topical Analgesic, (BIOFREEZE EX) Apply 1 application  topically daily as needed (pain).     oxyCODONE (OXY IR/ROXICODONE) 5 MG immediate release tablet Take 5 mg by mouth 3 (three) times daily.     PEMBROLIZUMAB IV Inject into the vein every 21 ( twenty-one) days.     pravastatin (PRAVACHOL) 80 MG tablet Take 80 mg by mouth daily.     RAPAFLO 8 MG CAPS capsule Take 8 mg by mouth daily. Silodosin     vitamin B-12 (CYANOCOBALAMIN) 500 MCG tablet Take 500 mcg by mouth daily.     No current facility-administered medications on file prior to visit.    No Known Allergies  Social History    Substance and Sexual Activity  Alcohol Use Not Currently    Social History   Tobacco Use  Smoking Status Former   Packs/day: 0.25   Years: 50.00   Total pack years: 12.50   Types: Cigarettes  Smokeless Tobacco Never    Review of Systems  Constitutional: Negative.   HENT: Negative.    Eyes: Negative.   Respiratory: Negative.    Cardiovascular: Negative.   Gastrointestinal: Negative.   Genitourinary: Negative.   Musculoskeletal: Negative.   Skin: Negative.   Neurological: Negative.   Endo/Heme/Allergies: Negative.   Psychiatric/Behavioral: Negative.      Objective   Vitals:   03/17/22 1515  BP: 138/73  Pulse: 87  Resp: 16  Temp: 98.7 F (37.1 C)  SpO2: 91%    Physical Exam Vitals reviewed.  Constitutional:      Appearance: Normal appearance. He is normal weight. He is not ill-appearing.  HENT:     Head: Normocephalic and atraumatic.  Cardiovascular:     Rate and Rhythm: Normal rate and regular rhythm.     Heart sounds: Normal heart sounds. No murmur heard.    No friction rub. No gallop.  Pulmonary:     Effort: Pulmonary effort is normal. No respiratory distress.     Breath sounds: Normal breath sounds. No stridor. No wheezing, rhonchi or rales.  Abdominal:     General: There is no distension.  Skin:    General: Skin is warm and dry.  Neurological:     Mental Status: He is alert and oriented to person, place, and time.   Dr. 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  The risks and benefits of the procedure including bleeding, infection, and pneumothorax were fully explained to the patient, who gave informed consent.

## 2022-03-19 NOTE — Progress Notes (Signed)
[facility] CSW Progress Note  Holiday representative  spoke with both pt and his sister over the phone together with nutrition.    Questions answered regarding qualifying for Mom's Meals and what can be covered by insurance.  Suggested different options to consider to offset expenses to be able to pay out of pocket for Mom's Meals, also reiterated transportation benefit available through Medicaid.  Discussed process to expand PCA hours through Medicaid.  Pt's sister to contact pt's CM through Medicaid to explore.  CSW to remain available to assist pt as appropriate throughout duration of treatment.     Henriette Combs, LCSW    Patient is participating in a Managed Medicaid Plan:  Yes

## 2022-03-24 ENCOUNTER — Encounter (HOSPITAL_COMMUNITY)
Admission: RE | Admit: 2022-03-24 | Discharge: 2022-03-24 | Disposition: A | Discharge status: Home / Self Care | Payer: 59 | Source: Ambulatory Visit | Attending: General Surgery | Admitting: General Surgery

## 2022-03-24 ENCOUNTER — Encounter (HOSPITAL_COMMUNITY): Payer: Self-pay

## 2022-03-24 DIAGNOSIS — R531 Weakness: Secondary | ICD-10-CM | POA: Diagnosis not present

## 2022-03-24 DIAGNOSIS — R252 Cramp and spasm: Secondary | ICD-10-CM | POA: Diagnosis not present

## 2022-03-24 DIAGNOSIS — G629 Polyneuropathy, unspecified: Secondary | ICD-10-CM | POA: Diagnosis not present

## 2022-03-24 DIAGNOSIS — E782 Mixed hyperlipidemia: Secondary | ICD-10-CM | POA: Diagnosis not present

## 2022-03-24 DIAGNOSIS — D1730 Benign lipomatous neoplasm of skin and subcutaneous tissue of unspecified sites: Secondary | ICD-10-CM | POA: Diagnosis not present

## 2022-03-24 DIAGNOSIS — G8929 Other chronic pain: Secondary | ICD-10-CM | POA: Diagnosis not present

## 2022-03-24 DIAGNOSIS — E559 Vitamin D deficiency, unspecified: Secondary | ICD-10-CM | POA: Diagnosis not present

## 2022-03-24 DIAGNOSIS — D539 Nutritional anemia, unspecified: Secondary | ICD-10-CM | POA: Diagnosis not present

## 2022-03-24 DIAGNOSIS — M545 Low back pain, unspecified: Secondary | ICD-10-CM | POA: Diagnosis not present

## 2022-03-24 DIAGNOSIS — M1990 Unspecified osteoarthritis, unspecified site: Secondary | ICD-10-CM | POA: Diagnosis not present

## 2022-03-24 DIAGNOSIS — R7301 Impaired fasting glucose: Secondary | ICD-10-CM | POA: Diagnosis not present

## 2022-03-24 DIAGNOSIS — I10 Essential (primary) hypertension: Secondary | ICD-10-CM | POA: Diagnosis not present

## 2022-03-25 ENCOUNTER — Ambulatory Visit (HOSPITAL_BASED_OUTPATIENT_CLINIC_OR_DEPARTMENT_OTHER): Payer: 59 | Admitting: Anesthesiology

## 2022-03-25 ENCOUNTER — Encounter (HOSPITAL_COMMUNITY): Payer: Self-pay | Admitting: General Surgery

## 2022-03-25 ENCOUNTER — Ambulatory Visit (HOSPITAL_COMMUNITY): Payer: 59

## 2022-03-25 ENCOUNTER — Encounter (HOSPITAL_COMMUNITY)
Admission: RE | Disposition: A | Discharge status: Home / Self Care | Payer: Self-pay | Source: Home / Self Care | Attending: General Surgery

## 2022-03-25 ENCOUNTER — Ambulatory Visit (HOSPITAL_COMMUNITY): Payer: 59 | Admitting: Anesthesiology

## 2022-03-25 ENCOUNTER — Ambulatory Visit (HOSPITAL_COMMUNITY)
Admission: RE | Admit: 2022-03-25 | Discharge: 2022-03-25 | Disposition: A | Discharge status: Home / Self Care | Payer: 59 | Attending: General Surgery | Admitting: General Surgery

## 2022-03-25 DIAGNOSIS — Z452 Encounter for adjustment and management of vascular access device: Secondary | ICD-10-CM | POA: Diagnosis not present

## 2022-03-25 DIAGNOSIS — I10 Essential (primary) hypertension: Secondary | ICD-10-CM | POA: Insufficient documentation

## 2022-03-25 DIAGNOSIS — F17210 Nicotine dependence, cigarettes, uncomplicated: Secondary | ICD-10-CM | POA: Insufficient documentation

## 2022-03-25 DIAGNOSIS — C01 Malignant neoplasm of base of tongue: Secondary | ICD-10-CM

## 2022-03-25 DIAGNOSIS — Z87891 Personal history of nicotine dependence: Secondary | ICD-10-CM

## 2022-03-25 DIAGNOSIS — C049 Malignant neoplasm of floor of mouth, unspecified: Secondary | ICD-10-CM

## 2022-03-25 HISTORY — PX: PORTACATH PLACEMENT: SHX2246

## 2022-03-25 SURGERY — INSERTION, TUNNELED CENTRAL VENOUS DEVICE, WITH PORT
Anesthesia: General | Site: Chest | Laterality: Left

## 2022-03-25 MED ORDER — PROPOFOL 10 MG/ML IV BOLUS
INTRAVENOUS | Status: DC | PRN
  Administered 2022-03-25: 50 mg via INTRAVENOUS

## 2022-03-25 MED ORDER — LIDOCAINE HCL (PF) 2 % IJ SOLN
INTRAMUSCULAR | Status: AC
  Filled 2022-03-25: qty 5

## 2022-03-25 MED ORDER — ORAL CARE MOUTH RINSE: 15.0000 mL | Freq: Once | OROMUCOSAL | Status: DC

## 2022-03-25 MED ORDER — CHLORHEXIDINE GLUCONATE CLOTH 2 % EX PADS: 6.0000 | MEDICATED_PAD | Freq: Once | CUTANEOUS | Status: DC

## 2022-03-25 MED ORDER — HEPARIN SOD (PORK) LOCK FLUSH 100 UNIT/ML IV SOLN
INTRAVENOUS | Status: DC | PRN
  Administered 2022-03-25: 500 [IU] via INTRAVENOUS

## 2022-03-25 MED ORDER — LIDOCAINE HCL (PF) 1 % IJ SOLN
INTRAMUSCULAR | Status: DC | PRN
  Administered 2022-03-25: 8 mL

## 2022-03-25 MED ORDER — LIDOCAINE HCL (PF) 1 % IJ SOLN
INTRAMUSCULAR | Status: AC
  Filled 2022-03-25: qty 30

## 2022-03-25 MED ORDER — LACTATED RINGERS IV SOLN: INTRAVENOUS | Status: DC

## 2022-03-25 MED ORDER — MIDAZOLAM HCL 2 MG/2ML IJ SOLN
INTRAMUSCULAR | Status: AC
  Filled 2022-03-25: qty 2

## 2022-03-25 MED ORDER — SODIUM CHLORIDE (PF) 0.9 % IJ SOLN
INTRAMUSCULAR | Status: DC | PRN
  Administered 2022-03-25: 500 mL

## 2022-03-25 MED ORDER — PROPOFOL 500 MG/50ML IV EMUL
INTRAVENOUS | Status: DC | PRN
  Administered 2022-03-25: 50 ug/kg/min via INTRAVENOUS

## 2022-03-25 MED ORDER — LIDOCAINE HCL 1 % IJ SOLN
INTRAMUSCULAR | Status: DC | PRN
  Administered 2022-03-25: 50 mg via INTRADERMAL

## 2022-03-25 MED ORDER — PROPOFOL 500 MG/50ML IV EMUL
INTRAVENOUS | Status: AC
  Filled 2022-03-25: qty 50

## 2022-03-25 MED ORDER — HEPARIN SOD (PORK) LOCK FLUSH 100 UNIT/ML IV SOLN
INTRAVENOUS | Status: AC
  Filled 2022-03-25: qty 5

## 2022-03-25 MED ORDER — CEFAZOLIN SODIUM-DEXTROSE 2-4 GM/100ML-% IV SOLN
2.0000 g | INTRAVENOUS | Status: DC
  Filled 2022-03-25: qty 100

## 2022-03-25 MED ORDER — CHLORHEXIDINE GLUCONATE 0.12 % MT SOLN: 15.0000 mL | Freq: Once | OROMUCOSAL | Status: DC

## 2022-03-25 MED ORDER — MIDAZOLAM HCL 5 MG/5ML IJ SOLN
INTRAMUSCULAR | Status: DC | PRN
  Administered 2022-03-25 (×2): 1 mg via INTRAVENOUS

## 2022-03-25 SURGICAL SUPPLY — 27 items
ADH SKN CLS APL DERMABOND .7 (GAUZE/BANDAGES/DRESSINGS) ×1
APL PRP STRL LF ISPRP CHG 10.5 (MISCELLANEOUS) ×1
APPLICATOR CHLORAPREP 10.5 ORG (MISCELLANEOUS) ×1 IMPLANT
BAG DECANTER FOR FLEXI CONT (MISCELLANEOUS) ×1 IMPLANT
CLOTH BEACON ORANGE TIMEOUT ST (SAFETY) ×1 IMPLANT
COVER LIGHT HANDLE (MISCELLANEOUS) IMPLANT
DERMABOND ADVANCED .7 DNX12 (GAUZE/BANDAGES/DRESSINGS) ×1 IMPLANT
DRAPE C-ARM FOLDED MOBILE STRL (DRAPES) ×1 IMPLANT
ELECT REM PT RETURN 9FT ADLT (ELECTROSURGICAL) ×1
ELECTRODE REM PT RTRN 9FT ADLT (ELECTROSURGICAL) ×1 IMPLANT
GLOVE BIOGEL PI IND STRL 7.0 (GLOVE) ×2 IMPLANT
GLOVE SURG SS PI 7.5 STRL IVOR (GLOVE) ×2 IMPLANT
GOWN STRL REUS W/TWL LRG LVL3 (GOWN DISPOSABLE) ×2 IMPLANT
IV NS 500ML (IV SOLUTION) ×1
IV NS 500ML BAXH (IV SOLUTION) ×1 IMPLANT
KIT PORT POWER 8FR ISP MRI (Port) ×1 IMPLANT
KIT TURNOVER KIT A (KITS) ×1 IMPLANT
NDL HYPO 25X1 1.5 SAFETY (NEEDLE) ×1 IMPLANT
NEEDLE HYPO 25X1 1.5 SAFETY (NEEDLE) ×1 IMPLANT
PACK MINOR (CUSTOM PROCEDURE TRAY) ×1 IMPLANT
PAD ARMBOARD 7.5X6 YLW CONV (MISCELLANEOUS) ×1 IMPLANT
SET BASIN LINEN APH (SET/KITS/TRAYS/PACK) ×1 IMPLANT
SUT MNCRL AB 4-0 PS2 18 (SUTURE) ×1 IMPLANT
SUT VIC AB 3-0 SH 27 (SUTURE) ×1
SUT VIC AB 3-0 SH 27X BRD (SUTURE) ×1 IMPLANT
SYR 5ML LL (SYRINGE) ×1 IMPLANT
SYR CONTROL 10ML LL (SYRINGE) ×1 IMPLANT

## 2022-03-25 NOTE — Op Note (Signed)
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  He has cancer of the base of the tongue.  The risks and benefits of the procedure including bleeding, infection, and pneumothorax were fully explained to the patient, who gave informed consent.  Procedure note: The patient was placed in the Trendelenburg position after the left upper chest was prepped and draped using the usual sterile technique with ChloraPrep.  Surgical site confirmation was performed.  1% Xylocaine was used for local anesthesia.  An incision was made below the left clavicle.  A subcutaneous pocket was formed.  The needle was advanced into the left subclavian vein using the Seldinger technique without difficulty.  The guidewire was then advanced into the right atrium under fluoroscopic guidance.  An introducer and peel-away sheath were placed over the guidewire.  The cath was inserted through the peel-away sheath and the peel-away sheath was removed.  The catheter was then attached to the port and the port placed in subcutaneous pocket.  Adequate positioning was confirmed by fluoroscopy.  Good backflow of venous blood was noted on aspiration of the port.  The port was flushed with heparin flush.  The subcutaneous layer was reapproximated using a 3-0 Vicryl interrupted suture.  The skin was closed using a 4-0 Monocryl subcuticular suture.  Dermabond was applied.  All tape and needle counts were correct at the end of the procedure.  The patient was awakened and transferred to PACU in stable condition.  A chest x-ray will be performed at that time.  Complications: None  EBL: Minimal  Specimen: None

## 2022-03-25 NOTE — Anesthesia Preprocedure Evaluation (Signed)
Anesthesia Evaluation  Patient identified by MRN, date of birth, ID band Patient awake    Reviewed: Allergy & Precautions, H&P , NPO status , Patient's Chart, lab work & pertinent test results, reviewed documented beta blocker date and time   History of Anesthesia Complications (+) DIFFICULT AIRWAY and history of anesthetic complications  Airway Mallampati: II  TM Distance: >3 FB Neck ROM: full    Dental no notable dental hx.    Pulmonary neg pulmonary ROS, former smoker   Pulmonary exam normal breath sounds clear to auscultation       Cardiovascular Exercise Tolerance: Good hypertension, negative cardio ROS  Rhythm:regular Rate:Normal     Neuro/Psych negative neurological ROS  negative psych ROS   GI/Hepatic negative GI ROS, Neg liver ROS,,,  Endo/Other  negative endocrine ROS    Renal/GU negative Renal ROS  negative genitourinary   Musculoskeletal   Abdominal   Peds  Hematology negative hematology ROS (+)   Anesthesia Other Findings   Reproductive/Obstetrics negative OB ROS                             Anesthesia Physical Anesthesia Plan  ASA: 3  Anesthesia Plan: General   Post-op Pain Management:    Induction:   PONV Risk Score and Plan: Propofol infusion  Airway Management Planned:   Additional Equipment:   Intra-op Plan:   Post-operative Plan:   Informed Consent: I have reviewed the patients History and Physical, chart, labs and discussed the procedure including the risks, benefits and alternatives for the proposed anesthesia with the patient or authorized representative who has indicated his/her understanding and acceptance.     Dental Advisory Given  Plan Discussed with: CRNA  Anesthesia Plan Comments:        Anesthesia Quick Evaluation

## 2022-03-25 NOTE — Interval H&P Note (Signed)
History and Physical Interval Note:  03/25/2022 7:13 AM  Nathaniel Hicks  has presented today for surgery, with the diagnosis of CANCER OF THE FLOOR OF THE MOUTH.  The various methods of treatment have been discussed with the patient and family. After consideration of risks, benefits and other options for treatment, the patient has consented to  Procedure(s): INSERTION PORT-A-CATH (Left) as a surgical intervention.  The patient's history has been reviewed, patient examined, no change in status, stable for surgery.  I have reviewed the patient's chart and labs.  Questions were answered to the patient's satisfaction.     Aviva Signs

## 2022-03-25 NOTE — Transfer of Care (Signed)
Immediate Anesthesia Transfer of Care Note  Patient: Nathaniel Hicks  Procedure(s) Performed: INSERTION PORT-A-CATH (Left: Chest)  Patient Location: PACU  Anesthesia Type:General  Level of Consciousness: awake  Airway & Oxygen Therapy: Patient Spontanous Breathing  Post-op Assessment: Report given to RN  Post vital signs: Reviewed and stable  Last Vitals:  Vitals Value Taken Time  BP    Temp    Pulse 71 03/25/22 0821  Resp 20 03/25/22 0821  SpO2 98 % 03/25/22 0821  Vitals shown include unvalidated device data.  Last Pain:  Vitals:   03/25/22 0700  TempSrc: Axillary  PainSc: 2       Patients Stated Pain Goal: 7 ([id number])  Complications: No notable events documented.

## 2022-03-27 NOTE — Anesthesia Postprocedure Evaluation (Signed)
Anesthesia Post Note  Patient: Nathaniel Hicks  Procedure(s) Performed: INSERTION PORT-A-CATH (Left: Chest)  Patient location during evaluation: Phase II Anesthesia Type: General Level of consciousness: awake Pain management: pain level controlled Vital Signs Assessment: post-procedure vital signs reviewed and stable Respiratory status: spontaneous breathing and respiratory function stable Cardiovascular status: blood pressure returned to baseline and stable Postop Assessment: no headache and no apparent nausea or vomiting Anesthetic complications: no Comments: Late entry   No notable events documented.   Last Vitals:  Vitals:   03/25/22 0845 03/25/22 0849  BP: (!) 150/76 (!) 150/76  Pulse: 61   Resp: 17 17  Temp:  (!) 36.4 C  SpO2: 100% 100%    Last Pain:  Vitals:   03/25/22 0849  TempSrc: Oral  PainSc: 0-No pain                 Louann Sjogren

## 2022-03-30 NOTE — Progress Notes (Signed)
Nathaniel Hicks [address] [place] [address]    Clinic Day:  03/31/2022  Referring physician: Celene Squibb, MD  Patient Care Team: Celene Squibb, MD as PCP - General (Internal Medicine) Nathaniel Montgomery, MD as Consulting Physician (Urology) Nathaniel Jack, MD as Medical Oncologist (Medical Oncology) Brien Mates, RN as Oncology Nurse Navigator (Medical Oncology)   ASSESSMENT & PLAN:   Assessment: 1.  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  Enlarged contralateral right submandibular node.  Enlargement of the left submandibular gland probably reactive.  No abscess. - Biopsy (07/18/2021) floor of the mouth: Invasive well to moderately differentiated keratinizing squamous cell carcinoma.  Tumor cells negative for p16. - 10/14/2021: Floor of the mouth resection, tracheostomy, bilateral selective neck dissections zones 1-3 by Dr. Redmond Baseman - Pathology: Invasive moderately differentiated keratinizing SCC, 4.1 cm, carcinoma invades for a depth of about 1.9 cm and involves saliva gland tissue.  Anterior, posterior, right, left resection margins are involved.  Deep resection margin is negative.  Metastatic carcinoma 1/3 level 1 lymph nodes.  3 level 2 and 3 level 3 lymph nodes negative for carcinoma.  0/10 lymph nodes involved in the left side neck zone 1, 2, 3 dissection.  ENE not identified.  LVI/perineural invasion not identified. - I have talked to pathologist.  Reexcision margins were considered negative.  He has a high risk feature which is T4 tumor. -  PET scan on 12/12/2021: Soft tissue thickening and calcification along the ventral aspect of the trachea with associated hypermetabolism corresponding to recent tracheostomy site.  7 mm right  paratracheal lymph node new with SUV 2.4.  Hypermetabolism along the dome of the right hepatic lobe with probable 2.4 cm low-attenuation lesion - Liver lesion biopsy (01/28/2022): Metastatic squamous cell carcinoma, keratinizing. - NGS testing: PD-L1 (22 C3): CPS: 100, CD274 (PD-L1) amplified, JAK2 amplified, PIK3CA pathogenic variant exon 10, T p53 pathogenic variant, MS-stable, TMB-low - Cycle 1 of Keytruda started on 03/10/2022.   2.  Social/family history: - He lives in his apartment by himself and is independent of ADLs and IADLs.  He does not drive.  He worked as a Dealer and several other jobs.  Quit smoking 1 month ago.  He smoked 1 pack/week for more than 50 years. - 1 brother had agent orange related cancer.  Another brother also had cancer, type unknown to the patient.   3.  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 - He was on close surveillance rather than adjuvant chemotherapy.    Plan: 1.  Stage IV (PT4PN1 M1) SCC of the floor of the mouth, p16 negative: - He received cycle 1 of Keytruda on 03/10/2022. - He felt tired for 1 day after treatment.  He also reported some constipation.  Mag citrate helped. - I have talked to Dr. Redmond Baseman.  He has local recurrence of disease in the floor of the mouth.  He does not report any major pain at this time. - He is taking hydrocodone 7.5/325/15 mL once daily as needed. - If there is any significant worsening of pain, will consider XRT. - Reviewed labs today which showed normal LFTs.  CBC was grossly normal.  Last TSH is 2.2. - He had port placed.  Reviewed  chest x-ray which showed port in place.  He will proceed with cycle 2 today.  RTC 3 weeks for follow-up.  Will plan to check ferritin and iron panel.   2.  Oral pain: - Continue liquid hydrocodone 1-2 times daily or as needed. - Recommend taking Colace daily for constipation.  Orders Placed This Encounter  Procedures   Magnesium    Standing Status:    Future    Standing Expiration Date:   07/14/2023   CBC with Differential    Standing Status:   Future    Standing Expiration Date:   07/14/2023   Comprehensive metabolic panel    Standing Status:   Future    Standing Expiration Date:   07/14/2023   Magnesium    Standing Status:   Future    Standing Expiration Date:   08/04/2023   CBC with Differential    Standing Status:   Future    Standing Expiration Date:   08/04/2023   Comprehensive metabolic panel    Standing Status:   Future    Standing Expiration Date:   08/04/2023   Ferritin    Standing Status:   Future    Standing Expiration Date:   03/31/2023   Iron and TIBC ([facility] ONLY)    Standing Status:   Future    Standing Expiration Date:   03/31/2023     I,Alexis Herring,acting as a Education administrator for [employer], MD.,have documented all relevant documentation on the behalf of Nathaniel Jack, MD,as directed by  Nathaniel Jack, MD while in the presence of Nathaniel Jack, MD.  I, Nathaniel Jack MD, have reviewed the above documentation for accuracy and completeness, and I agree with the above.    Nathaniel Jack, MD   3/19/20245:18 PM  CHIEF COMPLAINT:   Diagnosis: squamous cell carcinoma of the floor of the mouth and testicular seminoma    Cancer Staging  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[place]) Staging form: Oral Cavity, AJCC 8th Edition - Clinical stage from 11/25/2021: Stage IVC (cT4a, cN1, pM1) - Signed by Nathaniel Jack, MD on 02/04/2022  Testicle cancer, right Staging form: Testis, AJCC 7th Edition - Clinical: Stage I (T2, N0, M0) - Signed by Baird Cancer, PA-C on 07/23/2013    Prior Therapy: None  Current Therapy: Pembrolizumab   HISTORY OF PRESENT ILLNESS:   Oncology History  Testicle cancer, right  12/20/2012 Surgery   Right total orchiectomyt- 1. Testis, biopsy, right - SEMINOMA. PLEASE SEE COMMENT. 2. Testis, tumor, right - SEMINOMA, 7.5 CM. - ANGIOLYMPHATIC  INVASION PRESENT. - RESECTION MARGINS, NEGATIVE FOR ATYPIA OR MALIGNANCY.   01/18/2013 PET scan   Status post right orchiectomy. No findings specific for metastatic disease. Small para-aortic nodes measuring up to 5 mm short axis, without convincing hypermetabolism. Given location, attention on follow-up is suggested.   04/17/2013 Imaging   CT CAP- No evidence of metastatic disease in the chest, abdomen or pelvis. Proximal LAD coronary artery calcification.   07/20/2013 Imaging   CT abd/pelvis- No evidence of metastatic disease in the abdomen or pelvis.   10/24/2013 Imaging   CT abd/pelvis- No findings to suggest metastatic disease in the abdomen or pelvis   10/24/2013 Imaging   Chest xray- Probable COPD.  Negative for metastatic disease.   02/21/2014 Imaging   CT abd/pelvis- Stable abdominal pelvic CT status post right orchectomy. No evidence of adenopathy or other metastatic disease.   02/21/2014 Imaging   Chest xray- Left lower lobe mild atelectasis and/or infiltrate.  09/10/2014 Imaging   CT abd/pelvis- Status post right orchiectomy.   No evidence of metastatic disease.   3 mm nonobstructing right upper pole renal calculus. No hydronephrosis.   03/14/2015 Imaging   CT abd/pelvis- Stable exam. No evidence of metastatic disease or other acute findings within the abdomen or pelvis.   09/13/2015 Imaging   CT abd/pelvis- No acute findings and no evidence for mass or adenopathy.    04/24/2016 Imaging   CT abd/pelvis: IMPRESSION: 1. Stable exam. No new or progressive findings. No features to suggest metastatic disease.   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[place])  10/14/2021 Initial Diagnosis   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[place])   11/25/2021 Cancer Staging   Staging form: Oral Cavity, AJCC 8th Edition - Clinical stage from 11/25/2021: Stage IVC (cT4a, cN1, pM1) - Signed by Nathaniel Jack, MD on 02/04/2022 Histopathologic type: Squamous cell carcinoma, NOS Stage prefix: Initial  diagnosis Histologic grade (G): G2 Histologic grading system: 3 grade system   03/10/2022 -  Chemotherapy   Patient is on Treatment Plan : HEAD/NECK Pembrolizumab (200) q21d        INTERVAL HISTORY:   Nathaniel Hicks is a 82 y.o. male presenting to clinic today for follow up of squamous cell carcinoma of the floor of the mouth and testicular seminoma. He was last seen by me on 03/04/22.  Patient had his port placed on 03/25/22.  Today, he states that he is doing well overall. His appetite level is at 75%. His energy level is at 65%.  PAST MEDICAL HISTORY:   Past Medical History: Past Medical History:  Diagnosis Date   Arthritis    BPH (benign prostatic hyperplasia)    Difficult intubation    HOH (hard of hearing)    Hyperlipidemia 11/02/2017   Hypertension    Hypertension 11/02/2017   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[place])    2014   Vitamin D deficiency 11/02/2017    Surgical History: Past Surgical History:  Procedure Laterality Date   COLONOSCOPY N/A 11/24/2012   Procedure: COLONOSCOPY;  Surgeon: Rogene Houston, MD;  [facility];  Service: Endoscopy;  Laterality: N/A;  830-moved to [place] notified pt   FLOOR OF MOUTH BIOPSY N/A 10/14/2021   Procedure: FLOOR OF MOUTH RESECTION;  Surgeon: Melida Quitter, MD;  Location: [facility];  Service: ENT;  Laterality: N/A;   HEMORROIDECTOMY     KNEE ARTHROSCOPY WITH LATERAL MENISECTOMY Right 08/31/2017   Procedure: KNEE ARTHROSCOPY WITH LATERAL MENISECTOMY;  Surgeon: Carole Civil, MD;  Location: [facility];  Service: Orthopedics;  Laterality: Right;   LESION EXCISION N/A 03/23/2012   Procedure: EXCISION NEOPLASM SCALP ;  Surgeon: Jamesetta So, MD;  Location: [facility];  Service: General;  Laterality: N/A;  Excision of Scalp Neoplasm   ORCHIECTOMY Right 12/20/2012   Procedure: RIGHT RADICAL ORCHIECTOMY/POSSIBLE BX RIGHT TESTICLE;  Surgeon: Marissa Nestle, MD;  Location: [facility];  Service: Urology;  Laterality: Right;   PROSTATE SURGERY     RADICAL  NECK DISSECTION Bilateral 10/14/2021   Procedure: NECK DISSECTION;  Surgeon: Melida Quitter, MD;  Location: [facility];  Service: ENT;  Laterality: Bilateral;   SCALP LACERATION REPAIR     [facility]-Dr Tamala Julian   SKIN FULL THICKNESS GRAFT Bilateral 10/14/2021   Procedure: PLATYSMA FLAP CLOSURE;  Surgeon: Melida Quitter, MD;  Location: [facility];  Service: ENT;  Laterality: Bilateral;   TOOTH EXTRACTION  10/14/2021   Procedure: DENTAL EXTRACTIONS;  Surgeon: Melida Quitter, MD;  Location: [facility];  Service: ENT;;  TRACHEOSTOMY TUBE PLACEMENT N/A 10/14/2021   Procedure: TRACHEOSTOMY;  Surgeon: Melida Quitter, MD;  Location[facility];  Service: ENT;  Laterality: N/A;    Social History: Social History   Socioeconomic History   Marital status: Single    Spouse name: Not on file   Number of children: Not on file   Years of education: Not on file   Highest education level: Not on file  Occupational History   Not on file  Tobacco Use   Smoking status: Former    Packs/day: 0.25    Years: 50.00    Additional pack years: 0.00    Total pack years: 12.50    Types: Cigarettes   Smokeless tobacco: Never  Vaping Use   Vaping Use: Never used  Substance and Sexual Activity   Alcohol use: Not Currently   Drug use: No   Sexual activity: Yes    Birth control/protection: None  Other Topics Concern   Not on file  Social History Narrative   Not on file   Social Determinants of Health   Financial Resource Strain: Not on file  Food Insecurity: Not on file  Transportation Needs: Not on file  Physical Activity: Not on file  Stress: Not on file  Social Connections: Not on file  Intimate Partner Violence: Not on file    Family History: Family History  Problem Relation Age of Onset   Cancer Brother    Cancer Brother     Current Medications:  Current Outpatient Medications:    acetaminophen (TYLENOL) 650 MG CR tablet, Take 650 mg by mouth every 8 (eight) hours as needed for pain., Disp: , Rfl:    amLODipine  (NORVASC) 10 MG tablet, Take 1 tablet (10 mg total) by mouth daily., Disp: 300 tablet, Rfl: 0   amLODipine-benazepril (LOTREL) 10-40 MG capsule, Take 1 capsule by mouth daily., Disp: , Rfl:    carvedilol (COREG) 6.25 MG tablet, Take 1 tablet (6.25 mg total) by mouth 2 (two) times daily with a meal., Disp: 60 tablet, Rfl: 0   Cholecalciferol (VITAMIN D3) 50 MCG (2000 UT) TABS, Take 2,000 Units by mouth daily., Disp: , Rfl:    hydrALAZINE (APRESOLINE) 25 MG tablet, Take 25 mg by mouth 2 (two) times daily., Disp: , Rfl:    HYDROcodone-acetaminophen (HYCET) 7.5-325 mg/15 ml solution, Take 15 mL by mouth every 6 hours as needed for moderate pain (4 - 6)., Disp: 250 mL, Rfl: 0   magnesium oxide (MAG-OX) 400 MG tablet, Take 400 mg by mouth daily., Disp: , Rfl:    Menthol, Topical Analgesic, (BIOFREEZE EX), Apply 1 application  topically daily as needed (pain)., Disp: , Rfl:    oxyCODONE (OXY IR/ROXICODONE) 5 MG immediate release tablet, Take 5 mg by mouth 3 (three) times daily as needed for moderate pain., Disp: , Rfl:    PEMBROLIZUMAB IV, Inject into the vein every 21 ( twenty-one) days., Disp: , Rfl:    Polyethyl Glycol-Propyl Glycol (GOODSENSE LUBRICANT EYE DROPS OP), Place 1 drop into both eyes daily as needed (dry eyes)., Disp: , Rfl:    pravastatin (PRAVACHOL) 80 MG tablet, Take 80 mg by mouth daily., Disp: , Rfl:    RAPAFLO 8 MG CAPS capsule, Take 8 mg by mouth daily. Silodosin, Disp: , Rfl:    vitamin B-12 (CYANOCOBALAMIN) 500 MCG tablet, Take 500 mcg by mouth daily., Disp: , Rfl:  No current facility-administered medications for this visit.  Facility-Administered Medications Ordered in Other Visits:    sodium chloride flush (  NS) 0.9 % injection 10 mL, 10 mL, Intracatheter, PRN, Nathaniel Jack, MD, 10 mL at 03/31/22 1216   Allergies: No Known Allergies  REVIEW OF SYSTEMS:   Review of Systems  Constitutional:  Negative for chills, fatigue and fever.  HENT:   Negative for lump/mass,  mouth sores, nosebleeds, sore throat and trouble swallowing.   Eyes:  Negative for eye problems.  Respiratory:  Negative for cough and shortness of breath.   Cardiovascular:  Negative for chest pain, leg swelling and palpitations.  Gastrointestinal:  Negative for abdominal pain, constipation, diarrhea, nausea and vomiting.  Genitourinary:  Negative for bladder incontinence, difficulty urinating, dysuria, frequency, hematuria and nocturia.   Musculoskeletal:  Positive for neck pain. Negative for arthralgias, back pain, flank pain and myalgias.  Skin:  Negative for itching and rash.  Neurological:  Negative for dizziness, headaches and numbness.  Hematological:  Does not bruise/bleed easily.  Psychiatric/Behavioral:  Negative for depression, sleep disturbance and suicidal ideas. The patient is not nervous/anxious.   All other systems reviewed and are negative.    VITALS:   There were no vitals taken for this visit.  Wt Readings from Last 3 Encounters:  03/31/22 177 lb 3.2 oz (80.4 kg)  03/25/22 177 lb 0.5 oz (80.3 kg)  03/17/22 177 lb (80.3 kg)    There is no height or weight on file to calculate BMI.  Performance status (ECOG): 1 - Symptomatic but completely ambulatory  PHYSICAL EXAM:   Physical Exam Vitals and nursing note reviewed. Exam conducted with a chaperone present.  Constitutional:      Appearance: Normal appearance.  Cardiovascular:     Rate and Rhythm: Normal rate and regular rhythm.     Pulses: Normal pulses.     Heart sounds: Normal heart sounds.  Pulmonary:     Effort: Pulmonary effort is normal.     Breath sounds: Normal breath sounds.  Abdominal:     Palpations: Abdomen is soft. There is no hepatomegaly, splenomegaly or mass.     Tenderness: There is no abdominal tenderness.  Musculoskeletal:     Right lower leg: No edema.     Left lower leg: No edema.  Lymphadenopathy:     Cervical: No cervical adenopathy.     Right cervical: No superficial, deep or  posterior cervical adenopathy.    Left cervical: No superficial, deep or posterior cervical adenopathy.     Upper Body:     Right upper body: No supraclavicular or axillary adenopathy.     Left upper body: No supraclavicular or axillary adenopathy.  Neurological:     General: No focal deficit present.     Mental Status: He is alert and oriented to person, place, and time.  Psychiatric:        Mood and Affect: Mood normal.        Behavior: Behavior normal.     LABS:      Latest Ref Rng & Units 03/31/2022   10:16 AM 03/10/2022    8:05 AM 01/28/2022   11:24 AM  CBC  WBC 4.0 - 10.5 K/uL 7.2  7.2  5.6   Hemoglobin 13.0 - 17.0 g/dL 11.4  12.4  12.3   Hematocrit 39.0 - 52.0 % 35.2  39.4  37.6   Platelets 150 - 400 K/uL 292  275  252       Latest Ref Rng & Units 03/31/2022   10:16 AM 03/10/2022    8:05 AM 11/18/2021    2:13 PM  CMP  Glucose 70 - 99 mg/dL 103  110  91   BUN 8 - 23 mg/dL 10  17  14    Creatinine 0.61 - 1.24 mg/dL 0.80  0.93  0.83   Sodium 135 - 145 mmol/L 135  139  137   Potassium 3.5 - 5.1 mmol/L 3.6  4.4  4.2   Chloride 98 - 111 mmol/L 103  107  104   CO2 22 - 32 mmol/L 24  26  25    Calcium 8.9 - 10.3 mg/dL 9.6  9.8  9.9   Total Protein 6.5 - 8.1 g/dL 7.7  7.8  7.6   Total Bilirubin 0.3 - 1.2 mg/dL 0.7  0.6  0.3   Alkaline Phos 38 - 126 U/L 64  60  57   AST 15 - 41 U/L 16  16  17    ALT 0 - 44 U/L 10  10  21       Lab Results  Component Value Date   CEA 2.2 01/19/2013   /  CEA  Date Value Ref Range Status  01/19/2013 2.2 0.0 - 5.0 ng/mL Final    Comment:    Performed at [facility]   No results found for: "PSA1" No results found for: "CAN199" No results found for: "CAN125"  No results found for: "TOTALPROTELP", "ALBUMINELP", "A1GS", "A2GS", "BETS", "BETA2SER", "GAMS", "MSPIKE", "SPEI" No results found for: "TIBC", "FERRITIN", "IRONPCTSAT" Lab Results  Component Value Date   LDH 172 11/12/2020   LDH 175 10/26/2018   LDH 179 10/25/2013      STUDIES:   DG Chest Port 1 View  Result Date: 03/25/2022 CLINICAL DATA:  82 year male status post Port-A-Cath placement EXAM: PORTABLE CHEST - 1 VIEW COMPARISON:  10/21/2021 FINDINGS: The mediastinal contours are within normal limits. Unchanged cardiomegaly. Interval removal tracheostomy cannula placement left subclavian vein approach Port-A-Cath catheter tip in the superior aspect of the superior vena cava. The lungs are clear bilaterally without evidence of focal consolidation, pleural effusion, or pneumothorax. No acute osseous abnormality. IMPRESSION: Left subclavian vein approach Port-A-Cath catheter tip in the superior vena cava. No pneumothorax. Electronically Signed   By: Ruthann Cancer M.D.   On: 03/25/2022 08:47   DG C-Arm 1-60 Min-No Report  Result Date: 03/25/2022 Fluoroscopy was utilized by the requesting physician.  No radiographic interpretation.

## 2022-03-31 ENCOUNTER — Inpatient Hospital Stay: Payer: 59

## 2022-03-31 ENCOUNTER — Inpatient Hospital Stay (HOSPITAL_BASED_OUTPATIENT_CLINIC_OR_DEPARTMENT_OTHER): Payer: 59 | Admitting: Hematology

## 2022-03-31 VITALS — BP 138/59 | HR 73 | Temp 97.6°F | Resp 17 | Wt 177.2 lb

## 2022-03-31 DIAGNOSIS — Z5112 Encounter for antineoplastic immunotherapy: Secondary | ICD-10-CM | POA: Diagnosis not present

## 2022-03-31 DIAGNOSIS — C049 Malignant neoplasm of floor of mouth, unspecified: Secondary | ICD-10-CM | POA: Diagnosis not present

## 2022-03-31 DIAGNOSIS — Z87891 Personal history of nicotine dependence: Secondary | ICD-10-CM | POA: Diagnosis not present

## 2022-03-31 DIAGNOSIS — D508 Other iron deficiency anemias: Secondary | ICD-10-CM | POA: Diagnosis not present

## 2022-03-31 DIAGNOSIS — Z8547 Personal history of malignant neoplasm of testis: Secondary | ICD-10-CM | POA: Diagnosis not present

## 2022-03-31 DIAGNOSIS — C787 Secondary malignant neoplasm of liver and intrahepatic bile duct: Secondary | ICD-10-CM | POA: Diagnosis not present

## 2022-03-31 DIAGNOSIS — Z9079 Acquired absence of other genital organ(s): Secondary | ICD-10-CM | POA: Diagnosis not present

## 2022-03-31 DIAGNOSIS — Z95828 Presence of other vascular implants and grafts: Secondary | ICD-10-CM

## 2022-03-31 LAB — CBC WITH DIFFERENTIAL/PLATELET
Abs Immature Granulocytes: 0.02 10*3/uL (ref 0.00–0.07)
Basophils Absolute: 0.1 10*3/uL (ref 0.0–0.1)
Basophils Relative: 1 %
Eosinophils Absolute: 0.3 10*3/uL (ref 0.0–0.5)
Eosinophils Relative: 4 %
HCT: 35.2 % — ABNORMAL LOW (ref 39.0–52.0)
Hemoglobin: 11.4 g/dL — ABNORMAL LOW (ref 13.0–17.0)
Immature Granulocytes: 0 %
Lymphocytes Relative: 24 %
Lymphs Abs: 1.8 10*3/uL (ref 0.7–4.0)
MCH: 32.3 pg (ref 26.0–34.0)
MCHC: 32.4 g/dL (ref 30.0–36.0)
MCV: 99.7 fL (ref 80.0–100.0)
Monocytes Absolute: 0.9 10*3/uL (ref 0.1–1.0)
Monocytes Relative: 12 %
Neutro Abs: 4.2 10*3/uL (ref 1.7–7.7)
Neutrophils Relative %: 59 %
Platelets: 292 10*3/uL (ref 150–400)
RBC: 3.53 MIL/uL — ABNORMAL LOW (ref 4.22–5.81)
RDW: 13.4 % (ref 11.5–15.5)
WBC: 7.2 10*3/uL (ref 4.0–10.5)
nRBC: 0 % (ref 0.0–0.2)

## 2022-03-31 LAB — COMPREHENSIVE METABOLIC PANEL
ALT: 10 U/L (ref 0–44)
AST: 16 U/L (ref 15–41)
Albumin: 3.7 g/dL (ref 3.5–5.0)
Alkaline Phosphatase: 64 U/L (ref 38–126)
Anion gap: 8 (ref 5–15)
BUN: 10 mg/dL (ref 8–23)
CO2: 24 mmol/L (ref 22–32)
Calcium: 9.6 mg/dL (ref 8.9–10.3)
Chloride: 103 mmol/L (ref 98–111)
Creatinine, Ser: 0.8 mg/dL (ref 0.61–1.24)
GFR, Estimated: >60 mL/min (ref >60)
Glucose, Bld: 103 mg/dL — ABNORMAL HIGH (ref 70–99)
Potassium: 3.6 mmol/L (ref 3.5–5.1)
Sodium: 135 mmol/L (ref 135–145)
Total Bilirubin: 0.7 mg/dL (ref 0.3–1.2)
Total Protein: 7.7 g/dL (ref 6.5–8.1)

## 2022-03-31 LAB — MAGNESIUM: Magnesium: 1.9 mg/dL (ref 1.7–2.4)

## 2022-03-31 MED ORDER — SODIUM CHLORIDE 0.9 % IV SOLN
200.0000 mg | Freq: Once | INTRAVENOUS | Status: AC
  Administered 2022-03-31: 200 mg via INTRAVENOUS
  Filled 2022-03-31: qty 8

## 2022-03-31 MED ORDER — SODIUM CHLORIDE 0.9 % IV SOLN: Freq: Once | INTRAVENOUS | Status: AC

## 2022-03-31 MED ORDER — SODIUM CHLORIDE 0.9% FLUSH
10.0000 mL | Freq: Once | INTRAVENOUS | Status: AC
  Administered 2022-03-31: 10 mL via INTRAVENOUS

## 2022-03-31 MED ORDER — HEPARIN SOD (PORK) LOCK FLUSH 100 UNIT/ML IV SOLN
500.0000 [IU] | Freq: Once | INTRAVENOUS | Status: AC | PRN
  Administered 2022-03-31: 500 [IU]

## 2022-03-31 MED ORDER — SODIUM CHLORIDE 0.9% FLUSH
10.0000 mL | INTRAVENOUS | Status: DC | PRN
  Administered 2022-03-31: 10 mL

## 2022-03-31 NOTE — Progress Notes (Signed)
Patient presents today for chemotherapy infusion. Patient is in satisfactory condition with no new complaints voiced.  Vital signs are stable.  Labs reviewed by Dr. Delton Coombes during the office visit and all labs are within treatment parameters. (Patient did not have TSH drawn today, okay per Dr Delton Coombes). We will proceed with treatment per MD orders.   Patient tolerated treatment well with no complaints voiced.  Patient left ambulatory in stable condition.  Vital signs stable at discharge.  Follow up as scheduled.

## 2022-03-31 NOTE — Patient Instructions (Signed)
[facility]  Discharge Instructions: Thank you for choosing [facility] to provide your oncology and hematology care.  If you have a lab appointment with the Cancer Center, please come in thru the Main Entrance and check in at the main information desk.  Wear comfortable clothing and clothing appropriate for easy access to any Portacath or PICC line.   We strive to give you quality time with your provider. You may need to reschedule your appointment if you arrive late (15 or more minutes).  Arriving late affects you and other patients whose appointments are after yours.  Also, if you miss three or more appointments without notifying the office, you may be dismissed from the clinic at the provider's discretion.      For prescription refill requests, have your pharmacy contact our office and allow 72 hours for refills to be completed.    Today you received the following chemotherapy and/or immunotherapy agents Keytruda.  Pembrolizumab Injection What is this medication? PEMBROLIZUMAB (PEM broe LIZ ue mab) treats some types of cancer. It works by helping your immune system slow or stop the spread of cancer cells. It is a monoclonal antibody. This medicine may be used for other purposes; ask your health care provider or pharmacist if you have questions. COMMON BRAND NAME(S): Keytruda What should I tell my care team before I take this medication? They need to know if you have any of these conditions: Allogeneic stem cell transplant (uses someone else's stem cells) Autoimmune diseases, such as Crohn disease, ulcerative colitis, lupus History of chest radiation Nervous system problems, such as Guillain-Barre syndrome, myasthenia gravis Organ transplant An unusual or allergic reaction to pembrolizumab, other medications, foods, dyes, or preservatives Pregnant or trying to get pregnant Breast-feeding How should I use this medication? This medication is injected  into a vein. It is given by your care team in a hospital or clinic setting. A special MedGuide will be given to you before each treatment. Be sure to read this information carefully each time. Talk to your care team about the use of this medication in children. While it may be prescribed for children as young as 6 months for selected conditions, precautions do apply. Overdosage: If you think you have taken too much of this medicine contact a poison control center or emergency room at once. NOTE: This medicine is only for you. Do not share this medicine with others. What if I miss a dose? Keep appointments for follow-up doses. It is important not to miss your dose. Call your care team if you are unable to keep an appointment. What may interact with this medication? Interactions have not been studied. This list may not describe all possible interactions. Give your health care provider a list of all the medicines, herbs, non-prescription drugs, or dietary supplements you use. Also tell them if you smoke, drink alcohol, or use illegal drugs. Some items may interact with your medicine. What should I watch for while using this medication? Your condition will be monitored carefully while you are receiving this medication. You may need blood work while taking this medication. This medication may cause serious skin reactions. They can happen weeks to months after starting the medication. Contact your care team right away if you notice fevers or flu-like symptoms with a rash. The rash may be red or purple and then turn into blisters or peeling of the skin. You may also notice a red rash with swelling of the face, lips, or lymph   nodes in your neck or under your arms. Tell your care team right away if you have any change in your eyesight. Talk to your care team if you may be pregnant. Serious birth defects can occur if you take this medication during pregnancy and for 4 months after the last dose. You will need a  negative pregnancy test before starting this medication. Contraception is recommended while taking this medication and for 4 months after the last dose. Your care team can help you find the option that works for you. Do not breastfeed while taking this medication and for 4 months after the last dose. What side effects may I notice from receiving this medication? Side effects that you should report to your care team as soon as possible: Allergic reactions--skin rash, itching, hives, swelling of the face, lips, tongue, or throat Dry cough, shortness of breath or trouble breathing Eye pain, redness, irritation, or discharge with blurry or decreased vision Heart muscle inflammation--unusual weakness or fatigue, shortness of breath, chest pain, fast or irregular heartbeat, dizziness, swelling of the ankles, feet, or hands Hormone gland problems--headache, sensitivity to light, unusual weakness or fatigue, dizziness, fast or irregular heartbeat, increased sensitivity to cold or heat, excessive sweating, constipation, hair loss, increased thirst or amount of urine, tremors or shaking, irritability Infusion reactions--chest pain, shortness of breath or trouble breathing, feeling faint or lightheaded Kidney injury (glomerulonephritis)--decrease in the amount of urine, red or dark brown urine, foamy or bubbly urine, swelling of the ankles, hands, or feet Liver injury--right upper belly pain, loss of appetite, nausea, light-colored stool, dark yellow or brown urine, yellowing skin or eyes, unusual weakness or fatigue Pain, tingling, or numbness in the hands or feet, muscle weakness, change in vision, confusion or trouble speaking, loss of balance or coordination, trouble walking, seizures Rash, fever, and swollen lymph nodes Redness, blistering, peeling, or loosening of the skin, including inside the mouth Sudden or severe stomach pain, bloody diarrhea, fever, nausea, vomiting Side effects that usually do not  require medical attention (report to your care team if they continue or are bothersome): Bone, joint, or muscle pain Diarrhea Fatigue Loss of appetite Nausea Skin rash This list may not describe all possible side effects. Call your doctor for medical advice about side effects. You may report side effects to FDA at 1-800-FDA-1088. Where should I keep my medication? This medication is given in a hospital or clinic. It will not be stored at home. NOTE: This sheet is a summary. It may not cover all possible information. If you have questions about this medicine, talk to your doctor, pharmacist, or health care provider.  2023 Elsevier/Gold Standard (2021-05-13 00:00:00)        To help prevent nausea and vomiting after your treatment, we encourage you to take your nausea medication as directed.  BELOW ARE SYMPTOMS THAT SHOULD BE REPORTED IMMEDIATELY: *FEVER GREATER THAN 100.4 F (38 C) OR HIGHER *CHILLS OR SWEATING *NAUSEA AND VOMITING THAT IS NOT CONTROLLED WITH YOUR NAUSEA MEDICATION *UNUSUAL SHORTNESS OF BREATH *UNUSUAL BRUISING OR BLEEDING *URINARY PROBLEMS (pain or burning when urinating, or frequent urination) *BOWEL PROBLEMS (unusual diarrhea, constipation, pain near the anus) TENDERNESS IN MOUTH AND THROAT WITH OR WITHOUT PRESENCE OF ULCERS (sore throat, sores in mouth, or a toothache) UNUSUAL RASH, SWELLING OR PAIN  UNUSUAL VAGINAL DISCHARGE OR ITCHING   Items with * indicate a potential emergency and should be followed up as soon as possible or go to the Emergency Department if any problems should occur.    Please show the CHEMOTHERAPY ALERT CARD or IMMUNOTHERAPY ALERT CARD at check-in to the Emergency Department and triage nurse.  Should you have questions after your visit or need to cancel or reschedule your appointment, please contact [facility] [phone]  and follow the prompts.  Office hours are 8:00 a.m. to 4:30 p.m. Monday - Friday. Please note  that voicemails left after 4:00 p.m. may not be returned until the following business day.  We are closed weekends and major holidays. You have access to a nurse at all times for urgent questions. Please call the main number to the clinic [phone] and follow the prompts.  For any non-urgent questions, you may also contact your provider using MyChart. We now offer e-Visits for anyone 18 and older to request care online for non-urgent symptoms. For details visit [email or web address].   Also download the MyChart app! Go to the app store, search "MyChart", open the app, select [facility], and log in with your MyChart username and password.   

## 2022-03-31 NOTE — Patient Instructions (Addendum)
[facility] Discharge Instructions   You were seen and examined today by Dr. Delton Coombes.  He reviewed the results of your lab work which are normal/stable.   We will proceed with your treatment today.   You may take a stool softener (Colace/docusate sodium) 2 pills daily to help prevent constipation. If you are taking the stool softeners and develop diarrhea, you should stop taking the stool softener.   Return as scheduled.    Thank you for choosing [facility] to provide your oncology and hematology care.  To afford each patient quality time with our provider, please arrive at least 15 minutes before your scheduled appointment time.   If you have a lab appointment with the [facility] please come in thru the Main Entrance and check in at the main information desk.  You need to re-schedule your appointment should you arrive 10 or more minutes late.  We strive to give you quality time with our providers, and arriving late affects you and other patients whose appointments are after yours.  Also, if you no show three or more times for appointments you may be dismissed from the clinic at the providers discretion.     Again, thank you for choosing [facility].  Our hope is that these requests will decrease the amount of time that you wait before being seen by our physicians.       _____________________________________________________________  Should you have questions after your visit to [facility], please contact our office at [phone] and follow the prompts.  Our office hours are 8:00 a.m. and 4:30 p.m. Monday - Friday.  Please note that voicemails left after 4:00 p.m. may not be returned until the following business day.  We are closed weekends and major holidays.  You do have access to a nurse 24-7, just call the main number to the clinic [phone] and do not press any options, hold  on the line and a nurse will answer the phone.    For prescription refill requests, have your pharmacy contact our office and allow 72 hours.    Due to Covid, you will need to wear a mask upon entering the hospital. If you do not have a mask, a mask will be given to you at the Main Entrance upon arrival. For doctor visits, patients may have 1 support person age 3 or older with them. For treatment visits, patients can not have anyone with them due to social distancing guidelines and our immunocompromised population.

## 2022-03-31 NOTE — Progress Notes (Signed)
Patient has been examined by Dr. Katragadda. Vital signs and labs have been reviewed by MD - ANC, Creatinine, LFTs, hemoglobin, and platelets are within treatment parameters per M.D. - pt may proceed with treatment.  Primary RN and pharmacy notified.  

## 2022-04-06 ENCOUNTER — Encounter (HOSPITAL_COMMUNITY): Payer: Self-pay | Admitting: General Surgery

## 2022-04-06 ENCOUNTER — Encounter: Payer: Self-pay | Admitting: Hematology

## 2022-04-06 DIAGNOSIS — Z930 Tracheostomy status: Secondary | ICD-10-CM | POA: Diagnosis not present

## 2022-04-07 ENCOUNTER — Other Ambulatory Visit (HOSPITAL_COMMUNITY): Payer: Self-pay

## 2022-04-08 ENCOUNTER — Other Ambulatory Visit: Payer: Self-pay | Admitting: *Deleted

## 2022-04-08 DIAGNOSIS — Z930 Tracheostomy status: Secondary | ICD-10-CM | POA: Diagnosis not present

## 2022-04-08 MED ORDER — HYDROCODONE-ACETAMINOPHEN 7.5-325 MG/15ML PO SOLN: 15.0000 mL | Freq: Four times a day (QID) | ORAL | 0 refills | Status: DC | PRN

## 2022-04-14 ENCOUNTER — Encounter: Payer: Self-pay | Admitting: Hematology

## 2022-04-14 ENCOUNTER — Other Ambulatory Visit: Payer: Self-pay

## 2022-04-20 NOTE — Progress Notes (Deleted)
Rapid Infusion Rituximab Pharmacist Evaluation  Nathaniel Hicks is a 82 y.o. male being treated with rituximab for ***. This patient {Actions; may/not:14603} be considered for RIR.   A pharmacist has verified the patient tolerated rituximab infusions per the [facility] standard infusion protocol without grade 3-4 infusion reactions. The treatment plan will be updated to reflect RIR if the patient qualifies per the checklist below:   Age > 24 years old {YES/NO:21197}  Clinically significant cardiovascular disease {YES/NO:21197}  Circulating lymphocyte count < 5000/uL prior to cycle two {YES/NO:21197} Lab Results  Component Value Date   LYMPHSABS 1.8 03/31/2022    Prior documented grade 3-4 infusion reaction to rituximab {YES/NO:21197}  Prior documented grade 1-2 infusion reaction to rituximab (If YES, Pharmacist will confirm with Physician if patient is still a candidate for RIR) {YES/NO:21197}  Previous rituximab infusion within the past 6 months {YES/NO:21197}  Treatment Plan updated orders to reflect RIR {YES/NO:21197}   [name] {DOES/DOES NOT:200015} meet the criteria for Rapid Infusion Rituximab. This patient {ACTION; IS/IS [id number]} going to be switched to rapid infusion rituximab.   Stephens Shire 04/20/22 3:58 PM

## 2022-04-20 NOTE — Progress Notes (Signed)
[facility] [address] [place] [address]    Clinic Day:  04/21/2022  Referring physician: [name], MD  Patient Care Team: [name], MD as PCP - General (Internal Medicine) [name], MD as Consulting Physician (Urology) [name], MD as Medical Oncologist (Medical Oncology) [name], RN as Oncology Nurse Navigator (Medical Oncology)   ASSESSMENT & PLAN:   Assessment: 1.  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  Enlarged contralateral right submandibular node.  Enlargement of the left submandibular gland probably reactive.  No abscess. - Biopsy (07/18/2021) floor of the mouth: Invasive well to moderately differentiated keratinizing squamous cell carcinoma.  Tumor cells negative for p16. - 10/14/2021: Floor of the mouth resection, tracheostomy, bilateral selective neck dissections zones 1-3 by Dr. [name] - Pathology: Invasive moderately differentiated keratinizing SCC, 4.1 cm, carcinoma invades for a depth of about 1.9 cm and involves saliva gland tissue.  Anterior, posterior, right, left resection margins are involved.  Deep resection margin is negative.  Metastatic carcinoma 1/3 level 1 lymph nodes.  3 level 2 and 3 level 3 lymph nodes negative for carcinoma.  0/10 lymph nodes involved in the left side neck zone 1, 2, 3 dissection.  ENE not identified.  LVI/perineural invasion not identified. - I have talked to pathologist.  Reexcision margins were considered negative.  He has a high risk feature which is T4 tumor. -  PET scan on 12/12/2021: Soft tissue thickening and calcification along the ventral aspect of the trachea with associated hypermetabolism corresponding to recent tracheostomy site.  7 mm right  paratracheal lymph node new with SUV 2.4.  Hypermetabolism along the dome of the right hepatic lobe with probable 2.4 cm low-attenuation lesion - Liver lesion biopsy (01/28/2022): Metastatic squamous cell carcinoma, keratinizing. - NGS testing: PD-L1 (22 C3): CPS: 100, CD274 (PD-L1) amplified, JAK2 amplified, PIK3CA pathogenic variant exon 10, T p53 pathogenic variant, MS-stable, TMB-low - Per Dr. [name], he also has local recurrence of disease in the floor of the mouth. - Cycle 1 of Keytruda started on 03/10/2022.   2.  Social/family history: - He lives in his apartment by himself and is independent of ADLs and IADLs.  He does not drive.  He worked as a Curator and several other jobs.  Quit smoking 1 month ago.  He smoked 1 pack/week for more than 50 years. - 1 brother had agent orange related cancer.  Another brother also had cancer, type unknown to the patient.   3.  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 - He was on close surveillance rather than adjuvant chemotherapy.   Plan: 1.  Stage IV (PT4PN1 M1) SCC of the floor of the mouth, p16 negative: - He received 2 cycles of pembrolizumab. - He reported tiredness lasting for 1 to 2 days after each infusion. - Labs today: Normal LFTs and creatinine.  CBC with with normal white count and platelet count.  TSH is 1.26. - Recommend proceed with treatment today.  RTC 3 weeks for follow-up.  Plan to repeat scans after cycle 4.   2.  Oral pain: - He reports soreness in the lower is getting better. - Continue liquid Hycodan twice daily as needed.  3.  Macrocytic anemia: -  Hemoglobin today is 10.8 with MCV 100.6. - Recommend checking ferritin and iron panel at next visit.  No orders of the defined types were placed in this encounter.     I,[name],acting as a Neurosurgeon for [name], MD.,have documented all relevant documentation on the behalf of [name], MD,as directed by   [name], MD while in the presence of [name], MD.   I, [name] MD, have reviewed the above documentation for accuracy and completeness, and I agree with the above.   [name], MD   4/9/20244:38 PM  CHIEF COMPLAINT:   Diagnosis: squamous cell carcinoma of the floor of the mouth and testicular seminoma    Cancer Staging  Cancer of floor of mouth Staging form: Oral Cavity, AJCC 8th Edition - Clinical stage from 11/25/2021: Stage IVC (cT4a, cN1, pM1) - Signed by [name], MD on 02/04/2022  Testicle cancer, right Staging form: Testis, AJCC 7th Edition - Clinical: Stage I (T2, N0, M0) - Signed by [name], PA-C on 07/23/2013    Prior Therapy: none  Current Therapy:  Pembrolizumab    HISTORY OF PRESENT ILLNESS:   Oncology History  Testicle cancer, right  12/20/2012 Surgery   Right total orchiectomyt- 1. Testis, biopsy, right - SEMINOMA. PLEASE SEE COMMENT. 2. Testis, tumor, right - SEMINOMA, 7.5 CM. - ANGIOLYMPHATIC INVASION PRESENT. - RESECTION MARGINS, NEGATIVE FOR ATYPIA OR MALIGNANCY.   01/18/2013 PET scan   Status post right orchiectomy. No findings specific for metastatic disease. Small para-aortic nodes measuring up to 5 mm short axis, without convincing hypermetabolism. Given location, attention on follow-up is suggested.   04/17/2013 Imaging   CT CAP- No evidence of metastatic disease in the chest, abdomen or pelvis. Proximal LAD coronary artery calcification.   07/20/2013 Imaging   CT abd/pelvis- No evidence of metastatic disease in the abdomen or pelvis.   10/24/2013 Imaging   CT abd/pelvis- No findings to suggest metastatic disease in the abdomen or pelvis   10/24/2013 Imaging   Chest xray- Probable COPD.  Negative for metastatic disease.   02/21/2014 Imaging   CT abd/pelvis- Stable abdominal pelvic CT status post right orchectomy. No evidence of adenopathy or other metastatic disease.   02/21/2014  Imaging   Chest xray- Left lower lobe mild atelectasis and/or infiltrate.     09/10/2014 Imaging   CT abd/pelvis- Status post right orchiectomy.   No evidence of metastatic disease.   3 mm nonobstructing right upper pole renal calculus. No hydronephrosis.   03/14/2015 Imaging   CT abd/pelvis- Stable exam. No evidence of metastatic disease or other acute findings within the abdomen or pelvis.   09/13/2015 Imaging   CT abd/pelvis- No acute findings and no evidence for mass or adenopathy.    04/24/2016 Imaging   CT abd/pelvis: IMPRESSION: 1. Stable exam. No new or progressive findings. No features to suggest metastatic disease.   Cancer of floor of mouth  10/14/2021 Initial Diagnosis   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HCC)   11/25/2021 Cancer Staging   Staging form: Oral Cavity, AJCC 8th Edition - Clinical stage from 11/25/2021: Stage IVC (cT4a, cN1, pM1) - Signed by [name], MD on 02/04/2022 Histopathologic type: Squamous cell carcinoma, NOS Stage prefix: Initial diagnosis Histologic grade (G): G2 Histologic grading system: 3 grade system   03/10/2022 -  Chemotherapy   Patient is on Treatment Plan : HEAD/NECK Pembrolizumab (200) q21d        INTERVAL HISTORY:   Nathaniel Hicks is a 82 y.o. male presenting  to clinic today for follow up of squamous cell carcinoma of the floor of the mouth and testicular seminoma. He was last seen by me on 03/31/22.  Today, he states that he is doing well overall. His appetite level is at 100%. His energy level is at 75%.  PAST MEDICAL HISTORY:   Past Medical History: Past Medical History:  Diagnosis Date   Arthritis    BPH (benign prostatic hyperplasia)    Difficult intubation    HOH (hard of hearing)    Hyperlipidemia 11/02/2017   Hypertension    Hypertension 11/02/2017   Testicular cancer    2014   Vitamin D deficiency 11/02/2017    Surgical History: Past Surgical History:  Procedure Laterality Date   COLONOSCOPY N/A 11/24/2012    Procedure: COLONOSCOPY;  Surgeon: Malissa Hippo, MD;  [facility];  Service: Endoscopy;  Laterality: N/A;  830-moved to 730 Ann notified pt   FLOOR OF MOUTH BIOPSY N/A 10/14/2021   Procedure: FLOOR OF MOUTH RESECTION;  Surgeon: Christia Reading, MD;  Location: [facility] OR;  Service: ENT;  Laterality: N/A;   HEMORROIDECTOMY     KNEE ARTHROSCOPY WITH LATERAL MENISECTOMY Right 08/31/2017   Procedure: KNEE ARTHROSCOPY WITH LATERAL MENISECTOMY;  Surgeon: Vickki Hearing, MD;  Location: [facility]S;  Service: Orthopedics;  Laterality: Right;   LESION EXCISION N/A 03/23/2012   Procedure: EXCISION NEOPLASM SCALP ;  Surgeon: Dalia Heading, MD;  Location: [facility];  Service: General;  Laterality: N/A;  Excision of Scalp Neoplasm   ORCHIECTOMY Right 12/20/2012   Procedure: RIGHT RADICAL ORCHIECTOMY/POSSIBLE BX RIGHT TESTICLE;  Surgeon: Ky Barban, MD;  Location: [facility];  Service: Urology;  Laterality: Right;   PORTACATH PLACEMENT Left 03/25/2022   Procedure: INSERTION PORT-A-CATH;  Surgeon: Franky Macho, MD;  Location: [facility];  Service: General;  Laterality: Left;   PROSTATE SURGERY     RADICAL NECK DISSECTION Bilateral 10/14/2021   Procedure: NECK DISSECTION;  Surgeon: Christia Reading, MD;  Location: [facility] OR;  Service: ENT;  Laterality: Bilateral;   SCALP LACERATION REPAIR     [facility]-Dr Katrinka Blazing   SKIN FULL THICKNESS GRAFT Bilateral 10/14/2021   Procedure: PLATYSMA FLAP CLOSURE;  Surgeon: Christia Reading, MD;  Location: [facility] OR;  Service: ENT;  Laterality: Bilateral;   TOOTH EXTRACTION  10/14/2021   Procedure: DENTAL EXTRACTIONS;  Surgeon: Christia Reading, MD;  Location: [facility] OR;  Service: ENT;;   TRACHEOSTOMY TUBE PLACEMENT N/A 10/14/2021   Procedure: TRACHEOSTOMY;  Surgeon: Christia Reading, MD;  Location: [facility] OR;  Service: ENT;  Laterality: N/A;    Social History: Social History   Socioeconomic History   Marital status: Single    Spouse name: Not on file   Number of children: Not on file   Years of education:  Not on file   Highest education level: Not on file  Occupational History   Not on file  Tobacco Use   Smoking status: Former    Packs/day: 0.25    Years: 50.00    Additional pack years: 0.00    Total pack years: 12.50    Types: Cigarettes   Smokeless tobacco: Never  Vaping Use   Vaping Use: Never used  Substance and Sexual Activity   Alcohol use: Not Currently   Drug use: No   Sexual activity: Yes    Birth control/protection: None  Other Topics Concern   Not on file  Social History Narrative   Not on file   Social Determinants of Health  Financial Resource Strain: Not on file  Food Insecurity: Not on file  Transportation Needs: Not on file  Physical Activity: Not on file  Stress: Not on file  Social Connections: Not on file  Intimate Partner Violence: Not on file    Family History: Family History  Problem Relation Age of Onset   Cancer Brother    Cancer Brother     Current Medications:  Current Outpatient Medications:    acetaminophen (TYLENOL) 650 MG CR tablet, Take 650 mg by mouth every 8 (eight) hours as needed for pain., Disp: , Rfl:    amLODipine (NORVASC) 10 MG tablet, Take 1 tablet (10 mg total) by mouth daily., Disp: 300 tablet, Rfl: 0   amLODipine-benazepril (LOTREL) 10-40 MG capsule, Take 1 capsule by mouth daily., Disp: , Rfl:    carvedilol (COREG) 6.25 MG tablet, Take 1 tablet (6.25 mg total) by mouth 2 (two) times daily with a meal., Disp: 60 tablet, Rfl: 0   Cholecalciferol (VITAMIN D3) 50 MCG (2000 UT) TABS, Take 2,000 Units by mouth daily., Disp: , Rfl:    hydrALAZINE (APRESOLINE) 25 MG tablet, Take 25 mg by mouth 2 (two) times daily., Disp: , Rfl:    HYDROcodone-acetaminophen (HYCET) 7.5-325 mg/15 ml solution, Take 15 mLs by mouth every 6 (six) hours as needed for moderate pain., Disp: 473 mL, Rfl: 0   magnesium oxide (MAG-OX) 400 MG tablet, Take 400 mg by mouth daily., Disp: , Rfl:    Menthol, Topical Analgesic, (BIOFREEZE EX), Apply 1  application  topically daily as needed (pain)., Disp: , Rfl:    naloxone (NARCAN) nasal spray 4 mg/0.1 mL, SMARTSIG:Both Nares, Disp: , Rfl:    oxyCODONE (OXY IR/ROXICODONE) 5 MG immediate release tablet, Take 5 mg by mouth 3 (three) times daily as needed for moderate pain., Disp: , Rfl:    PEMBROLIZUMAB IV, Inject into the vein every 21 ( twenty-one) days., Disp: , Rfl:    Polyethyl Glycol-Propyl Glycol (GOODSENSE LUBRICANT EYE DROPS OP), Place 1 drop into both eyes daily as needed (dry eyes)., Disp: , Rfl:    pravastatin (PRAVACHOL) 80 MG tablet, Take 80 mg by mouth daily., Disp: , Rfl:    RAPAFLO 8 MG CAPS capsule, Take 8 mg by mouth daily. Silodosin, Disp: , Rfl:    vitamin B-12 (CYANOCOBALAMIN) 500 MCG tablet, Take 500 mcg by mouth daily., Disp: , Rfl:  No current facility-administered medications for this visit.  Facility-Administered Medications Ordered in Other Visits:    sodium chloride flush (NS) 0.9 % injection 10 mL, 10 mL, Intravenous, PRN, [name], MD, 10 mL at 04/21/22 1224   sodium chloride flush (NS) 0.9 % injection 10 mL, 10 mL, Intracatheter, PRN, [name], MD, 10 mL at 04/21/22 1513   Allergies: No Known Allergies  REVIEW OF SYSTEMS:   Review of Systems  Constitutional:  Positive for fatigue. Negative for chills and fever.  HENT:   Negative for lump/mass, mouth sores, nosebleeds, sore throat and trouble swallowing.   Eyes:  Negative for eye problems.  Respiratory:  Negative for cough and shortness of breath.   Cardiovascular:  Negative for chest pain, leg swelling and palpitations.  Gastrointestinal:  Negative for abdominal pain, constipation, diarrhea, nausea and vomiting.  Genitourinary:  Negative for bladder incontinence, difficulty urinating, dysuria, frequency, hematuria and nocturia.   Musculoskeletal:  Negative for arthralgias, back pain, flank pain, myalgias and neck pain.  Skin:  Negative for itching and rash.  Neurological:  Negative  for dizziness, headaches and numbness.  Hematological:  Does not bruise/bleed easily.  Psychiatric/Behavioral:  Negative for depression, sleep disturbance and suicidal ideas. The patient is not nervous/anxious.   All other systems reviewed and are negative.    VITALS:   There were no vitals taken for this visit.  Wt Readings from Last 3 Encounters:  04/21/22 174 lb 6.4 oz (79.1 kg)  03/31/22 177 lb 3.2 oz (80.4 kg)  03/25/22 177 lb 0.5 oz (80.3 kg)    There is no height or weight on file to calculate BMI.  Performance status (ECOG): 1 - Symptomatic but completely ambulatory  PHYSICAL EXAM:   Physical Exam Vitals and nursing note reviewed. Exam conducted with a chaperone present.  Constitutional:      Appearance: Normal appearance.  Cardiovascular:     Rate and Rhythm: Normal rate and regular rhythm.     Pulses: Normal pulses.     Heart sounds: Normal heart sounds.  Pulmonary:     Effort: Pulmonary effort is normal.     Breath sounds: Normal breath sounds.  Abdominal:     Palpations: Abdomen is soft. There is no hepatomegaly, splenomegaly or mass.     Tenderness: There is no abdominal tenderness.  Musculoskeletal:     Right lower leg: No edema.     Left lower leg: No edema.  Lymphadenopathy:     Cervical: No cervical adenopathy.     Right cervical: No superficial, deep or posterior cervical adenopathy.    Left cervical: No superficial, deep or posterior cervical adenopathy.     Upper Body:     Right upper body: No supraclavicular or axillary adenopathy.     Left upper body: No supraclavicular or axillary adenopathy.  Neurological:     General: No focal deficit present.     Mental Status: He is alert and oriented to person, place, and time.  Psychiatric:        Mood and Affect: Mood normal.        Behavior: Behavior normal.     LABS:      Latest Ref Rng & Units 04/21/2022   12:26 PM 03/31/2022   10:16 AM 03/10/2022    8:05 AM  CBC  WBC 4.0 - 10.5 K/uL 7.2  7.2   7.2   Hemoglobin 13.0 - 17.0 g/dL 08.1  44.8  18.5   Hematocrit 39.0 - 52.0 % 33.8  35.2  39.4   Platelets 150 - 400 K/uL 287  292  275       Latest Ref Rng & Units 04/21/2022   12:26 PM 03/31/2022   10:16 AM 03/10/2022    8:05 AM  CMP  Glucose 70 - 99 mg/dL 631  497  026   BUN 8 - 23 mg/dL 16  10  17    Creatinine 0.61 - 1.24 mg/dL 3.78  5.88  5.02   Sodium 135 - 145 mmol/L 135  135  139   Potassium 3.5 - 5.1 mmol/L 4.1  3.6  4.4   Chloride 98 - 111 mmol/L 105  103  107   CO2 22 - 32 mmol/L 25  24  26    Calcium 8.9 - 10.3 mg/dL 9.8  9.6  9.8   Total Protein 6.5 - 8.1 g/dL 7.4  7.7  7.8   Total Bilirubin 0.3 - 1.2 mg/dL 0.4  0.7  0.6   Alkaline Phos 38 - 126 U/L 57  64  60   AST 15 - 41 U/L 14  16  16    ALT  0 - 44 U/L 12  10  10       Lab Results  Component Value Date   CEA 2.2 01/19/2013   /  CEA  Date Value Ref Range Status  01/19/2013 2.2 0.0 - 5.0 ng/mL Final    Comment:    Performed at [facility]   No results found for: "PSA1" No results found for: "CAN199" No results found for: "CAN125"  No results found for: "TOTALPROTELP", "ALBUMINELP", "A1GS", "A2GS", "BETS", "BETA2SER", "GAMS", "MSPIKE", "SPEI" No results found for: "TIBC", "FERRITIN", "IRONPCTSAT" Lab Results  Component Value Date   LDH 172 11/12/2020   LDH 175 10/26/2018   LDH 179 10/25/2013     STUDIES:   DG Chest Port 1 View  Result Date: 03/25/2022 CLINICAL DATA:  52ighty-one year male status post Port-A-Cath placement EXAM: PORTABLE CHEST - 1 VIEW COMPARISON:  10/21/2021 FINDINGS: The mediastinal contours are within normal limits. Unchanged cardiomegaly. Interval removal tracheostomy cannula placement left subclavian vein approach Port-A-Cath catheter tip in the superior aspect of the superior vena cava. The lungs are clear bilaterally without evidence of focal consolidation, pleural effusion, or pneumothorax. No acute osseous abnormality. IMPRESSION: Left subclavian vein approach Port-A-Cath  catheter tip in the superior vena cava. No pneumothorax. Electronically Signed   By: Marliss Cootsylan  Suttle M.D.   On: 03/25/2022 08:47   DG C-Arm 1-60 Min-No Report  Result Date: 03/25/2022 Fluoroscopy was utilized by the requesting physician.  No radiographic interpretation.

## 2022-04-21 ENCOUNTER — Other Ambulatory Visit: Payer: Self-pay | Admitting: *Deleted

## 2022-04-21 ENCOUNTER — Inpatient Hospital Stay: Payer: 59

## 2022-04-21 ENCOUNTER — Inpatient Hospital Stay: Payer: 59 | Attending: Hematology

## 2022-04-21 ENCOUNTER — Encounter: Payer: Self-pay | Admitting: Hematology

## 2022-04-21 ENCOUNTER — Inpatient Hospital Stay (HOSPITAL_BASED_OUTPATIENT_CLINIC_OR_DEPARTMENT_OTHER): Payer: 59 | Admitting: Hematology

## 2022-04-21 VITALS — BP 137/53 | HR 72 | Temp 96.8°F | Resp 16

## 2022-04-21 DIAGNOSIS — Z5112 Encounter for antineoplastic immunotherapy: Secondary | ICD-10-CM | POA: Diagnosis not present

## 2022-04-21 DIAGNOSIS — Z79620 Long term (current) use of immunosuppressive biologic: Secondary | ICD-10-CM | POA: Diagnosis not present

## 2022-04-21 DIAGNOSIS — C049 Malignant neoplasm of floor of mouth, unspecified: Secondary | ICD-10-CM | POA: Diagnosis not present

## 2022-04-21 DIAGNOSIS — Z8547 Personal history of malignant neoplasm of testis: Secondary | ICD-10-CM | POA: Diagnosis not present

## 2022-04-21 DIAGNOSIS — Z87891 Personal history of nicotine dependence: Secondary | ICD-10-CM | POA: Insufficient documentation

## 2022-04-21 DIAGNOSIS — I10 Essential (primary) hypertension: Secondary | ICD-10-CM | POA: Diagnosis not present

## 2022-04-21 DIAGNOSIS — D539 Nutritional anemia, unspecified: Secondary | ICD-10-CM | POA: Diagnosis not present

## 2022-04-21 DIAGNOSIS — Z95828 Presence of other vascular implants and grafts: Secondary | ICD-10-CM

## 2022-04-21 LAB — CBC WITH DIFFERENTIAL/PLATELET
Abs Immature Granulocytes: 0.02 10*3/uL (ref 0.00–0.07)
Basophils Absolute: 0.1 10*3/uL (ref 0.0–0.1)
Basophils Relative: 1 %
Eosinophils Absolute: 0.2 10*3/uL (ref 0.0–0.5)
Eosinophils Relative: 3 %
HCT: 33.8 % — ABNORMAL LOW (ref 39.0–52.0)
Hemoglobin: 10.8 g/dL — ABNORMAL LOW (ref 13.0–17.0)
Immature Granulocytes: 0 %
Lymphocytes Relative: 21 %
Lymphs Abs: 1.5 10*3/uL (ref 0.7–4.0)
MCH: 32.1 pg (ref 26.0–34.0)
MCHC: 32 g/dL (ref 30.0–36.0)
MCV: 100.6 fL — ABNORMAL HIGH (ref 80.0–100.0)
Monocytes Absolute: 0.8 10*3/uL (ref 0.1–1.0)
Monocytes Relative: 11 %
Neutro Abs: 4.7 10*3/uL (ref 1.7–7.7)
Neutrophils Relative %: 64 %
Platelets: 287 10*3/uL (ref 150–400)
RBC: 3.36 MIL/uL — ABNORMAL LOW (ref 4.22–5.81)
RDW: 13.4 % (ref 11.5–15.5)
WBC: 7.2 10*3/uL (ref 4.0–10.5)
nRBC: 0 % (ref 0.0–0.2)

## 2022-04-21 LAB — COMPREHENSIVE METABOLIC PANEL
ALT: 12 U/L (ref 0–44)
AST: 14 U/L — ABNORMAL LOW (ref 15–41)
Albumin: 3.5 g/dL (ref 3.5–5.0)
Alkaline Phosphatase: 57 U/L (ref 38–126)
Anion gap: 5 (ref 5–15)
BUN: 16 mg/dL (ref 8–23)
CO2: 25 mmol/L (ref 22–32)
Calcium: 9.8 mg/dL (ref 8.9–10.3)
Chloride: 105 mmol/L (ref 98–111)
Creatinine, Ser: 1.06 mg/dL (ref 0.61–1.24)
GFR, Estimated: >60 mL/min (ref >60)
Glucose, Bld: 113 mg/dL — ABNORMAL HIGH (ref 70–99)
Potassium: 4.1 mmol/L (ref 3.5–5.1)
Sodium: 135 mmol/L (ref 135–145)
Total Bilirubin: 0.4 mg/dL (ref 0.3–1.2)
Total Protein: 7.4 g/dL (ref 6.5–8.1)

## 2022-04-21 LAB — TSH: TSH: 1.265 u[IU]/mL (ref 0.350–4.500)

## 2022-04-21 MED ORDER — HYDROCODONE-ACETAMINOPHEN 7.5-325 MG/15ML PO SOLN: 15.0000 mL | Freq: Four times a day (QID) | ORAL | 0 refills | Status: DC | PRN

## 2022-04-21 MED ORDER — SODIUM CHLORIDE 0.9 % IV SOLN: Freq: Once | INTRAVENOUS | Status: AC

## 2022-04-21 MED ORDER — SODIUM CHLORIDE 0.9 % IV SOLN
200.0000 mg | Freq: Once | INTRAVENOUS | Status: AC
  Administered 2022-04-21: 200 mg via INTRAVENOUS
  Filled 2022-04-21: qty 8

## 2022-04-21 MED ORDER — SODIUM CHLORIDE 0.9% FLUSH
10.0000 mL | INTRAVENOUS | Status: DC | PRN
  Administered 2022-04-21: 10 mL

## 2022-04-21 MED ORDER — SODIUM CHLORIDE 0.9% FLUSH
10.0000 mL | INTRAVENOUS | Status: DC | PRN
  Administered 2022-04-21: 10 mL via INTRAVENOUS

## 2022-04-21 MED ORDER — HEPARIN SOD (PORK) LOCK FLUSH 100 UNIT/ML IV SOLN
500.0000 [IU] | Freq: Once | INTRAVENOUS | Status: AC | PRN
  Administered 2022-04-21: 500 [IU]

## 2022-04-21 NOTE — Patient Instructions (Signed)
[facility] Discharge Instructions   You were seen and examined today by Dr. Katragadda.  He reviewed the results of your lab work which are normal/stable.   We will proceed with your treatment today.  Return as scheduled.    Thank you for choosing [facility] at [facility] to provide your oncology and hematology care.  To afford each patient quality time with our provider, please arrive at least 15 minutes before your scheduled appointment time.   If you have a lab appointment with the Cancer Center please come in thru the Main Entrance and check in at the main information desk.  You need to re-schedule your appointment should you arrive 10 or more minutes late.  We strive to give you quality time with our providers, and arriving late affects you and other patients whose appointments are after yours.  Also, if you no show three or more times for appointments you may be dismissed from the clinic at the providers discretion.     Again, thank you for choosing [facility].  Our hope is that these requests will decrease the amount of time that you wait before being seen by our physicians.       _____________________________________________________________  Should you have questions after your visit to [facility], please contact our office at [phone] and follow the prompts.  Our office hours are 8:00 a.m. and 4:30 p.m. Monday - Friday.  Please note that voicemails left after 4:00 p.m. may not be returned until the following business day.  We are closed weekends and major holidays.  You do have access to a nurse 24-7, just call the main number to the clinic [phone] and do not press any options, hold on the line and a nurse will answer the phone.    For prescription refill requests, have your pharmacy contact our office and allow 72 hours.    Due to Covid, you will need to wear a mask upon entering  the hospital. If you do not have a mask, a mask will be given to you at the Main Entrance upon arrival. For doctor visits, patients may have 1 support person age 18 or older with them. For treatment visits, patients can not have anyone with them due to social distancing guidelines and our immunocompromised population.      

## 2022-04-21 NOTE — Progress Notes (Signed)
Patient presents today for Keytruda infusion per providers order.  Vital signs and labs reviewed by MD.  Message received from Allison Anderson RN/Dr. Katragadda patient okay for treatment.  Treatment given today per MD orders.  Stable during infusion without adverse affects.  Vital signs stable.  No complaints at this time.  Discharge from clinic ambulatory in stable condition.  Alert and oriented X 3.  Follow up with [facility] as scheduled.  

## 2022-04-21 NOTE — Progress Notes (Signed)
Patients port flushed without difficulty.  Good blood return noted with no bruising or swelling noted at site.  Patient remains accessed for treatment.  

## 2022-04-21 NOTE — Progress Notes (Signed)
Patient has been examined by Dr. Katragadda. Vital signs and labs have been reviewed by MD - ANC, Creatinine, LFTs, hemoglobin, and platelets are within treatment parameters per M.D. - pt may proceed with treatment.  Primary RN and pharmacy notified.  

## 2022-04-21 NOTE — Patient Instructions (Signed)
[facility]  Discharge Instructions: Thank you for choosing [facility] to provide your oncology and hematology care.  If you have a lab appointment with the Cancer Center - please note that after April 8th, 2024, all labs will be drawn in the cancer center.  You do not have to check in or register with the main entrance as you have in the past but will complete your check-in in the cancer center.  Wear comfortable clothing and clothing appropriate for easy access to any Portacath or PICC line.   We strive to give you quality time with your provider. You may need to reschedule your appointment if you arrive late (15 or more minutes).  Arriving late affects you and other patients whose appointments are after yours.  Also, if you miss three or more appointments without notifying the office, you may be dismissed from the clinic at the provider's discretion.      For prescription refill requests, have your pharmacy contact our office and allow 72 hours for refills to be completed.    Today you received the following chemotherapy and/or immunotherapy agents Keytruda      To help prevent nausea and vomiting after your treatment, we encourage you to take your nausea medication as directed.  BELOW ARE SYMPTOMS THAT SHOULD BE REPORTED IMMEDIATELY: *FEVER GREATER THAN 100.4 F (38 C) OR HIGHER *CHILLS OR SWEATING *NAUSEA AND VOMITING THAT IS NOT CONTROLLED WITH YOUR NAUSEA MEDICATION *UNUSUAL SHORTNESS OF BREATH *UNUSUAL BRUISING OR BLEEDING *URINARY PROBLEMS (pain or burning when urinating, or frequent urination) *BOWEL PROBLEMS (unusual diarrhea, constipation, pain near the anus) TENDERNESS IN MOUTH AND THROAT WITH OR WITHOUT PRESENCE OF ULCERS (sore throat, sores in mouth, or a toothache) UNUSUAL RASH, SWELLING OR PAIN  UNUSUAL VAGINAL DISCHARGE OR ITCHING   Items with * indicate a potential emergency and should be followed up as soon as possible or go to the  Emergency Department if any problems should occur.  Please show the CHEMOTHERAPY ALERT CARD or IMMUNOTHERAPY ALERT CARD at check-in to the Emergency Department and triage nurse.  Should you have questions after your visit or need to cancel or reschedule your appointment, please contact [facility] [phone]  and follow the prompts.  Office hours are 8:00 a.m. to 4:30 p.m. Monday - Friday. Please note that voicemails left after 4:00 p.m. may not be returned until the following business day.  We are closed weekends and major holidays. You have access to a nurse at all times for urgent questions. Please call the main number to the clinic [phone] and follow the prompts.  For any non-urgent questions, you may also contact your provider using MyChart. We now offer e-Visits for anyone 18 and older to request care online for non-urgent symptoms. For details visit [email or web address].   Also download the MyChart app! Go to the app store, search "MyChart", open the app, select [facility], and log in with your MyChart username and password.   

## 2022-04-22 ENCOUNTER — Other Ambulatory Visit: Payer: Self-pay | Admitting: *Deleted

## 2022-04-22 LAB — T4: T4, Total: 5.3 ug/dL (ref 4.5–12.0)

## 2022-04-24 DIAGNOSIS — R531 Weakness: Secondary | ICD-10-CM | POA: Diagnosis not present

## 2022-04-26 ENCOUNTER — Other Ambulatory Visit: Payer: Self-pay

## 2022-05-07 DIAGNOSIS — Z930 Tracheostomy status: Secondary | ICD-10-CM | POA: Diagnosis not present

## 2022-05-09 ENCOUNTER — Other Ambulatory Visit (HOSPITAL_COMMUNITY): Payer: Self-pay

## 2022-05-09 DIAGNOSIS — Z930 Tracheostomy status: Secondary | ICD-10-CM | POA: Diagnosis not present

## 2022-05-11 ENCOUNTER — Other Ambulatory Visit: Payer: Self-pay

## 2022-05-11 DIAGNOSIS — C049 Malignant neoplasm of floor of mouth, unspecified: Secondary | ICD-10-CM

## 2022-05-11 NOTE — Progress Notes (Signed)
[facility] [address] [place] [address]    Clinic Day:  05/12/2022  Referring physician: [name], MD  Patient Care Team: [name], MD as PCP - General (Internal Medicine) [name], MD as Consulting Physician (Urology) [name], MD as Medical Oncologist (Medical Oncology) [name], RN as Oncology Nurse Navigator (Medical Oncology)   ASSESSMENT & PLAN:   Assessment: 1.  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  Enlarged contralateral right submandibular node.  Enlargement of the left submandibular gland probably reactive.  No abscess. - Biopsy (07/18/2021) floor of the mouth: Invasive well to moderately differentiated keratinizing squamous cell carcinoma.  Tumor cells negative for p16. - 10/14/2021: Floor of the mouth resection, tracheostomy, bilateral selective neck dissections zones 1-3 by Dr. [name] - Pathology: Invasive moderately differentiated keratinizing SCC, 4.1 cm, carcinoma invades for a depth of about 1.9 cm and involves saliva gland tissue.  Anterior, posterior, right, left resection margins are involved.  Deep resection margin is negative.  Metastatic carcinoma 1/3 level 1 lymph nodes.  3 level 2 and 3 level 3 lymph nodes negative for carcinoma.  0/10 lymph nodes involved in the left side neck zone 1, 2, 3 dissection.  ENE not identified.  LVI/perineural invasion not identified. - I have talked to pathologist.  Reexcision margins were considered negative.  He has a high risk feature which is T4 tumor. -  PET scan on 12/12/2021: Soft tissue thickening and calcification along the ventral aspect of the trachea with associated hypermetabolism corresponding to recent tracheostomy site.  7 mm right  paratracheal lymph node new with SUV 2.4.  Hypermetabolism along the dome of the right hepatic lobe with probable 2.4 cm low-attenuation lesion - Liver lesion biopsy (01/28/2022): Metastatic squamous cell carcinoma, keratinizing. - NGS testing: PD-L1 (22 C3): CPS: 100, CD274 (PD-L1) amplified, JAK2 amplified, PIK3CA pathogenic variant exon 10, T p53 pathogenic variant, MS-stable, TMB-low - Per Dr. [name], he also has local recurrence of disease in the floor of the mouth. - Cycle 1 of Keytruda started on 03/10/2022.   2.  Social/family history: - He lives in his apartment by himself and is independent of ADLs and IADLs.  He does not drive.  He worked as a Curator and several other jobs.  Quit smoking 1 month ago.  He smoked 1 pack/week for more than 50 years. - 1 brother had agent orange related cancer.  Another brother also had cancer, type unknown to the patient.   3.  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 - He was on close surveillance rather than adjuvant chemotherapy.    Plan: 1.  Stage IV (PT4PN1 M1) SCC of the floor of the mouth, p16 negative: - He tolerated third cycle of pembrolizumab reasonably well. - No diarrhea, skin rashes or dry cough. - Reviewed labs today: Normal LFTs.  CBC grossly normal.  TSH is 2.2. - Proceed with cycle 4 today.  RTC 3 weeks.  Will plan on repeating PET scan prior to next visit.   2.  Oral pain: - He has tumor recurrence in the lower jaw.  He went to see his dentist for right lower jaw tooth extraction.  His dentist cannot do it because of the tumor  adjacent to it. - Continue hydrocodone liquid twice daily as needed. - We will make a referral to oral surgery.   3.  Macrocytic anemia: - Hemoglobin is 10.9 with ferritin of 195 and percent saturation of 18.  Closely monitor.    Orders Placed This Encounter  Procedures   NM PET Image Restag (PS) Skull Base To Thigh    Standing Status:   Future    Standing  Expiration Date:   05/12/2023    Order Specific Question:   If indicated for the ordered procedure, I authorize the administration of a radiopharmaceutical per Radiology protocol    Answer:   Yes    Order Specific Question:   Preferred imaging location?    Answer:   [name]      I,[name],acting as a Neurosurgeon for [employer], MD.,have documented all relevant documentation on the behalf of [name], MD,as directed by  [name], MD while in the presence of [name], MD.   I, [name] MD, have reviewed the above documentation for accuracy and completeness, and I agree with the above.   [name], MD   4/30/20245:01 PM  CHIEF COMPLAINT:   Diagnosis: squamous cell carcinoma of the floor of the mouth and testicular seminoma    Cancer Staging  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HCC) Staging form: Oral Cavity, AJCC 8th Edition - Clinical stage from 11/25/2021: Stage IVC (cT4a, cN1, pM1) - Signed by [name], MD on 02/04/2022  Testicle cancer, right Staging form: Testis, AJCC 7th Edition - Clinical: Stage I (T2, N0, M0) - Signed by [name], PA-C on 07/23/2013    Prior Therapy: none  Current Therapy:  Pembrolizumab    HISTORY OF PRESENT ILLNESS:   Oncology History  Testicle cancer, right  12/20/2012 Surgery   Right total orchiectomyt- 1. Testis, biopsy, right - SEMINOMA. PLEASE SEE COMMENT. 2. Testis, tumor, right - SEMINOMA, 7.5 CM. - ANGIOLYMPHATIC INVASION PRESENT. - RESECTION MARGINS, NEGATIVE FOR ATYPIA OR MALIGNANCY.   01/18/2013 PET scan   Status post right orchiectomy. No findings specific for metastatic disease. Small para-aortic nodes measuring up to 5 mm short axis, without convincing hypermetabolism. Given location, attention on follow-up is suggested.   04/17/2013 Imaging   CT CAP- No evidence of metastatic disease in the chest, abdomen or pelvis. Proximal LAD coronary artery  calcification.   07/20/2013 Imaging   CT abd/pelvis- No evidence of metastatic disease in the abdomen or pelvis.   10/24/2013 Imaging   CT abd/pelvis- No findings to suggest metastatic disease in the abdomen or pelvis   10/24/2013 Imaging   Chest xray- Probable COPD.  Negative for metastatic disease.   02/21/2014 Imaging   CT abd/pelvis- Stable abdominal pelvic CT status post right orchectomy. No evidence of adenopathy or other metastatic disease.   02/21/2014 Imaging   Chest xray- Left lower lobe mild atelectasis and/or infiltrate.     09/10/2014 Imaging   CT abd/pelvis- Status post right orchiectomy.   No evidence of metastatic disease.   3 mm nonobstructing right upper pole renal calculus. No hydronephrosis.   03/14/2015 Imaging   CT abd/pelvis- Stable exam. No evidence of metastatic disease or other acute findings within the abdomen or pelvis.   09/13/2015 Imaging   CT abd/pelvis- No acute findings and no evidence for mass or adenopathy.    04/24/2016 Imaging   CT abd/pelvis: IMPRESSION: 1. Stable exam. No new or progressive findings. No features to suggest metastatic disease.   Cancer of floor  of mouth (HCC)  10/14/2021 Initial Diagnosis   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HCC)   11/25/2021 Cancer Staging   Staging form: Oral Cavity, AJCC 8th Edition - Clinical stage from 11/25/2021: Stage IVC (cT4a, cN1, pM1) - Signed by [name], MD on 02/04/2022 Histopathologic type: Squamous cell carcinoma, NOS Stage prefix: Initial diagnosis Histologic grade (G): G2 Histologic grading system: 3 grade system   03/10/2022 -  Chemotherapy   Patient is on Treatment Plan : HEAD/NECK Pembrolizumab (200) q21d        INTERVAL HISTORY:   Nathaniel Hicks is a 82 y.o. male presenting to clinic today for follow up of squamous cell carcinoma of the floor of the mouth and testicular seminoma. He was last seen by me on 04/21/22.  Today, he states that he is doing well overall. His appetite level is at 100%.  His energy level is at 70%.  PAST MEDICAL HISTORY:   Past Medical History: Past Medical History:  Diagnosis Date   Arthritis    BPH (benign prostatic hyperplasia)    Difficult intubation    HOH (hard of hearing)    Hyperlipidemia 11/02/2017   Hypertension    Hypertension 11/02/2017   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HCC)    2014   Vitamin D deficiency 11/02/2017    Surgical History: Past Surgical History:  Procedure Laterality Date   COLONOSCOPY N/A 11/24/2012   Procedure: COLONOSCOPY;  Surgeon: Malissa Hippo, MD;  [facility];  Service: Endoscopy;  Laterality: N/A;  830-moved to 730 Ann notified pt   FLOOR OF MOUTH BIOPSY N/A 10/14/2021   Procedure: FLOOR OF MOUTH RESECTION;  Surgeon: Christia Reading, MD;  Location: [facility] OR;  Service: ENT;  Laterality: N/A;   HEMORROIDECTOMY     KNEE ARTHROSCOPY WITH LATERAL MENISECTOMY Right 08/31/2017   Procedure: KNEE ARTHROSCOPY WITH LATERAL MENISECTOMY;  Surgeon: Vickki Hearing, MD;  Location: [facility];  Service: Orthopedics;  Laterality: Right;   LESION EXCISION N/A 03/23/2012   Procedure: EXCISION NEOPLASM SCALP ;  Surgeon: Dalia Heading, MD;  Location: [facility];  Service: General;  Laterality: N/A;  Excision of Scalp Neoplasm   ORCHIECTOMY Right 12/20/2012   Procedure: RIGHT RADICAL ORCHIECTOMY/POSSIBLE BX RIGHT TESTICLE;  Surgeon: Ky Barban, MD;  Location: [facility];  Service: Urology;  Laterality: Right;   PORTACATH PLACEMENT Left 03/25/2022   Procedure: INSERTION PORT-A-CATH;  Surgeon: Franky Macho, MD;  Location: [facility];  Service: General;  Laterality: Left;   PROSTATE SURGERY     RADICAL NECK DISSECTION Bilateral 10/14/2021   Procedure: NECK DISSECTION;  Surgeon: Christia Reading, MD;  Location:  [facility] OR;  Service: ENT;  Laterality: Bilateral;   SCALP LACERATION REPAIR     [facility]-Dr Katrinka Blazing   SKIN FULL THICKNESS GRAFT Bilateral 10/14/2021   Procedure: PLATYSMA FLAP CLOSURE;  Surgeon: Christia Reading, MD;  Location: [facility] OR;  Service: ENT;   Laterality: Bilateral;   TOOTH EXTRACTION  10/14/2021   Procedure: DENTAL EXTRACTIONS;  Surgeon: Christia Reading, MD;  Location: [facility] OR;  Service: ENT;;   TRACHEOSTOMY TUBE PLACEMENT N/A 10/14/2021   Procedure: TRACHEOSTOMY;  Surgeon: Christia Reading, MD;  Location: [facility] OR;  Service: ENT;  Laterality: N/A;    Social History: Social History   Socioeconomic History   Marital status: Single    Spouse name: Not on file   Number of children: Not on file   Years of education: Not on file   Highest education level: Not on file  Occupational History   Not on file  Tobacco Use   Smoking status: Former    Packs/day: 0.25    Years: 50.00    Additional pack years: 0.00    Total pack years: 12.50    Types: Cigarettes   Smokeless tobacco: Never  Vaping Use   Vaping Use: Never used  Substance and Sexual Activity   Alcohol use: Not Currently   Drug use: No   Sexual activity: Yes    Birth control/protection: None  Other Topics Concern   Not on file  Social History Narrative   Not on file   Social Determinants of Health   Financial Resource Strain: Not on file  Food Insecurity: Not on file  Transportation Needs: Not on file  Physical Activity: Not on file  Stress: Not on file  Social Connections: Not on file  Intimate Partner Violence: Not on file    Family History: Family History  Problem Relation Age of Onset   Cancer Brother    Cancer Brother     Current Medications:  Current Outpatient Medications:    acetaminophen (TYLENOL) 650 MG CR tablet, Take 650 mg by mouth every 8 (eight) hours as needed for pain., Disp: , Rfl:    amLODipine (NORVASC) 10 MG tablet, Take 1 tablet (10 mg total) by mouth daily., Disp: 300 tablet, Rfl: 0   amLODipine-benazepril (LOTREL) 10-40 MG capsule, Take 1 capsule by mouth daily., Disp: , Rfl:    carvedilol (COREG) 6.25 MG tablet, Take 1 tablet (6.25 mg total) by mouth 2 (two) times daily with a meal., Disp: 60 tablet, Rfl: 0   Cholecalciferol  (VITAMIN D3) 50 MCG (2000 UT) TABS, Take 2,000 Units by mouth daily., Disp: , Rfl:    hydrALAZINE (APRESOLINE) 25 MG tablet, Take 25 mg by mouth 2 (two) times daily., Disp: , Rfl:    HYDROcodone-acetaminophen (HYCET) 7.5-325 mg/15 ml solution, Take 15 mLs by mouth every 6 (six) hours as needed for moderate pain., Disp: 473 mL, Rfl: 0   magnesium oxide (MAG-OX) 400 MG tablet, Take 400 mg by mouth daily., Disp: , Rfl:    Menthol, Topical Analgesic, (BIOFREEZE EX), Apply 1 application  topically daily as needed (pain)., Disp: , Rfl:    naloxone (NARCAN) nasal spray 4 mg/0.1 mL, SMARTSIG:Both Nares, Disp: , Rfl:    oxyCODONE (OXY IR/ROXICODONE) 5 MG immediate release tablet, Take 5 mg by mouth 3 (three) times daily as needed for moderate pain., Disp: , Rfl:    PEMBROLIZUMAB IV, Inject into the vein every 21 ( twenty-one) days., Disp: , Rfl:    Polyethyl Glycol-Propyl Glycol (GOODSENSE LUBRICANT EYE DROPS OP), Place 1 drop into both eyes daily as needed (dry eyes)., Disp: , Rfl:    pravastatin (PRAVACHOL) 80 MG tablet, Take 80 mg by mouth daily., Disp: , Rfl:    RAPAFLO 8 MG CAPS capsule, Take 8 mg by mouth daily. Silodosin, Disp: , Rfl:    vitamin B-12 (CYANOCOBALAMIN) 500 MCG tablet, Take 500 mcg by mouth daily., Disp: , Rfl:  No current facility-administered medications for this visit.  Facility-Administered Medications Ordered in Other Visits:    sodium chloride flush (NS) 0.9 % injection 10 mL, 10 mL, Intracatheter, PRN, [name], MD, 10 mL at 05/12/22 1520   Allergies: No Known Allergies  REVIEW OF SYSTEMS:   Review of Systems  Constitutional:  Negative for chills, fatigue and fever.  HENT:   Negative for lump/mass, mouth sores, nosebleeds, sore throat and trouble swallowing.   Eyes:  Negative for eye problems.  Respiratory:  Negative for cough and shortness of breath.   Cardiovascular:  Negative for chest pain, leg swelling and palpitations.  Gastrointestinal:  Negative for  abdominal pain, constipation, diarrhea, nausea and vomiting.  Genitourinary:  Negative for bladder incontinence, difficulty urinating, dysuria, frequency, hematuria and nocturia.   Musculoskeletal:  Negative for arthralgias, back pain, flank pain, myalgias and neck pain.  Skin:  Negative for itching and rash.  Neurological:  Negative for dizziness, headaches and numbness.  Hematological:  Does not bruise/bleed easily.  Psychiatric/Behavioral:  Negative for depression, sleep disturbance and suicidal ideas. The patient is not nervous/anxious.   All other systems reviewed and are negative.    VITALS:   There were no vitals taken for this visit.  Wt Readings from Last 3 Encounters:  05/12/22 174 lb 9.6 oz (79.2 kg)  04/21/22 174 lb 6.4 oz (79.1 kg)  03/31/22 177 lb 3.2 oz (80.4 kg)    There is no height or weight on file to calculate BMI.  Performance status (ECOG): 1 - Symptomatic but completely ambulatory  PHYSICAL EXAM:   Physical Exam Vitals and nursing note reviewed. Exam conducted with a chaperone present.  Constitutional:      Appearance: Normal appearance.  Cardiovascular:     Rate and Rhythm: Normal rate and regular rhythm.     Pulses: Normal pulses.     Heart sounds: Normal heart sounds.  Pulmonary:     Effort: Pulmonary effort is normal.     Breath sounds: Normal breath sounds.  Abdominal:     Palpations: Abdomen is soft. There is no hepatomegaly, splenomegaly or mass.     Tenderness: There is no abdominal tenderness.  Musculoskeletal:     Right lower leg: No edema.     Left lower leg: No edema.  Lymphadenopathy:     Cervical: No cervical adenopathy.     Right cervical: No superficial, deep or posterior cervical adenopathy.    Left cervical: No superficial, deep or posterior cervical adenopathy.     Upper Body:     Right upper body: No supraclavicular or axillary adenopathy.     Left upper body: No supraclavicular or axillary adenopathy.  Neurological:      General: No focal deficit present.     Mental Status: He is alert and oriented to person, place, and time.  Psychiatric:        Mood and Affect: Mood normal.        Behavior: Behavior normal.     LABS:      Latest Ref Rng & Units 05/12/2022   12:02 PM 04/21/2022   12:26 PM 03/31/2022   10:16 AM  CBC  WBC 4.0 - 10.5 K/uL 6.7  7.2  7.2   Hemoglobin 13.0 - 17.0 g/dL 16.1  09.6  04.5   Hematocrit 39.0 - 52.0 % 34.5  33.8  35.2   Platelets 150 - 400 K/uL 278  287  292       Latest Ref Rng & Units 05/12/2022   12:02 PM 04/21/2022   12:26 PM 03/31/2022   10:16 AM  CMP  Glucose 70 - 99 mg/dL 409  811  914   BUN 8 - 23 mg/dL 15  16  10    Creatinine 0.61 - 1.24 mg/dL 7.82  9.56  2.13   Sodium 135 - 145 mmol/L 136  135  135   Potassium 3.5 - 5.1 mmol/L 3.7  4.1  3.6   Chloride 98 - 111 mmol/L 104  105  103  CO2 22 - 32 mmol/L 25  25  24    Calcium 8.9 - 10.3 mg/dL 9.8  9.8  9.6   Total Protein 6.5 - 8.1 g/dL 7.5  7.4  7.7   Total Bilirubin 0.3 - 1.2 mg/dL 0.6  0.4  0.7   Alkaline Phos 38 - 126 U/L 60  57  64   AST 15 - 41 U/L 14  14  16    ALT 0 - 44 U/L 11  12  10       Lab Results  Component Value Date   CEA 2.2 01/19/2013   /  CEA  Date Value Ref Range Status  01/19/2013 2.2 0.0 - 5.0 ng/mL Final    Comment:    Performed at [facility]   No results found for: "PSA1" No results found for: "CAN199" No results found for: "CAN125"  No results found for: "TOTALPROTELP", "ALBUMINELP", "A1GS", "A2GS", "BETS", "BETA2SER", "GAMS", "MSPIKE", "SPEI" Lab Results  Component Value Date   TIBC 251 05/12/2022   FERRITIN 195 05/12/2022   IRONPCTSAT 18 05/12/2022   Lab Results  Component Value Date   LDH 172 11/12/2020   LDH 175 10/26/2018   LDH 179 10/25/2013     STUDIES:   No results found.

## 2022-05-12 ENCOUNTER — Inpatient Hospital Stay: Payer: 59

## 2022-05-12 ENCOUNTER — Inpatient Hospital Stay (HOSPITAL_BASED_OUTPATIENT_CLINIC_OR_DEPARTMENT_OTHER): Payer: 59 | Admitting: Hematology

## 2022-05-12 VITALS — BP 139/57 | HR 85 | Temp 97.7°F | Resp 18 | Ht 71.0 in | Wt 174.6 lb

## 2022-05-12 VITALS — BP 138/74 | HR 81 | Temp 97.0°F | Resp 18

## 2022-05-12 DIAGNOSIS — D508 Other iron deficiency anemias: Secondary | ICD-10-CM

## 2022-05-12 DIAGNOSIS — Z5112 Encounter for antineoplastic immunotherapy: Secondary | ICD-10-CM | POA: Diagnosis not present

## 2022-05-12 DIAGNOSIS — Z95828 Presence of other vascular implants and grafts: Secondary | ICD-10-CM

## 2022-05-12 DIAGNOSIS — Z87891 Personal history of nicotine dependence: Secondary | ICD-10-CM | POA: Diagnosis not present

## 2022-05-12 DIAGNOSIS — C049 Malignant neoplasm of floor of mouth, unspecified: Secondary | ICD-10-CM

## 2022-05-12 DIAGNOSIS — D539 Nutritional anemia, unspecified: Secondary | ICD-10-CM | POA: Diagnosis not present

## 2022-05-12 DIAGNOSIS — I10 Essential (primary) hypertension: Secondary | ICD-10-CM | POA: Diagnosis not present

## 2022-05-12 DIAGNOSIS — Z79620 Long term (current) use of immunosuppressive biologic: Secondary | ICD-10-CM | POA: Diagnosis not present

## 2022-05-12 LAB — CBC WITH DIFFERENTIAL/PLATELET
Abs Immature Granulocytes: 0.01 10*3/uL (ref 0.00–0.07)
Basophils Absolute: 0.1 10*3/uL (ref 0.0–0.1)
Basophils Relative: 1 %
Eosinophils Absolute: 0.3 10*3/uL (ref 0.0–0.5)
Eosinophils Relative: 5 %
HCT: 34.5 % — ABNORMAL LOW (ref 39.0–52.0)
Hemoglobin: 10.9 g/dL — ABNORMAL LOW (ref 13.0–17.0)
Immature Granulocytes: 0 %
Lymphocytes Relative: 24 %
Lymphs Abs: 1.6 10*3/uL (ref 0.7–4.0)
MCH: 31.8 pg (ref 26.0–34.0)
MCHC: 31.6 g/dL (ref 30.0–36.0)
MCV: 100.6 fL — ABNORMAL HIGH (ref 80.0–100.0)
Monocytes Absolute: 0.8 10*3/uL (ref 0.1–1.0)
Monocytes Relative: 11 %
Neutro Abs: 3.9 10*3/uL (ref 1.7–7.7)
Neutrophils Relative %: 59 %
Platelets: 278 10*3/uL (ref 150–400)
RBC: 3.43 MIL/uL — ABNORMAL LOW (ref 4.22–5.81)
RDW: 13.2 % (ref 11.5–15.5)
WBC: 6.7 10*3/uL (ref 4.0–10.5)
nRBC: 0 % (ref 0.0–0.2)

## 2022-05-12 LAB — COMPREHENSIVE METABOLIC PANEL
ALT: 11 U/L (ref 0–44)
AST: 14 U/L — ABNORMAL LOW (ref 15–41)
Albumin: 3.5 g/dL (ref 3.5–5.0)
Alkaline Phosphatase: 60 U/L (ref 38–126)
Anion gap: 7 (ref 5–15)
BUN: 15 mg/dL (ref 8–23)
CO2: 25 mmol/L (ref 22–32)
Calcium: 9.8 mg/dL (ref 8.9–10.3)
Chloride: 104 mmol/L (ref 98–111)
Creatinine, Ser: 0.83 mg/dL (ref 0.61–1.24)
GFR, Estimated: >60 mL/min (ref >60)
Glucose, Bld: 102 mg/dL — ABNORMAL HIGH (ref 70–99)
Potassium: 3.7 mmol/L (ref 3.5–5.1)
Sodium: 136 mmol/L (ref 135–145)
Total Bilirubin: 0.6 mg/dL (ref 0.3–1.2)
Total Protein: 7.5 g/dL (ref 6.5–8.1)

## 2022-05-12 LAB — IRON AND TIBC
Iron: 44 ug/dL — ABNORMAL LOW (ref 45–182)
Saturation Ratios: 18 % (ref 17.9–39.5)
TIBC: 251 ug/dL (ref 250–450)
UIBC: 207 ug/dL

## 2022-05-12 LAB — TSH: TSH: 2.22 u[IU]/mL (ref 0.350–4.500)

## 2022-05-12 LAB — FERRITIN: Ferritin: 195 ng/mL (ref 24–336)

## 2022-05-12 LAB — MAGNESIUM: Magnesium: 1.9 mg/dL (ref 1.7–2.4)

## 2022-05-12 MED ORDER — HYDROCODONE-ACETAMINOPHEN 7.5-325 MG/15ML PO SOLN: 15.0000 mL | Freq: Four times a day (QID) | ORAL | 0 refills | Status: DC | PRN

## 2022-05-12 MED ORDER — SODIUM CHLORIDE 0.9 % IV SOLN: Freq: Once | INTRAVENOUS | Status: AC

## 2022-05-12 MED ORDER — HEPARIN SOD (PORK) LOCK FLUSH 100 UNIT/ML IV SOLN
500.0000 [IU] | Freq: Once | INTRAVENOUS | Status: AC | PRN
  Administered 2022-05-12: 500 [IU]

## 2022-05-12 MED ORDER — SODIUM CHLORIDE 0.9% FLUSH
10.0000 mL | Freq: Once | INTRAVENOUS | Status: AC
  Administered 2022-05-12: 10 mL via INTRAVENOUS

## 2022-05-12 MED ORDER — SODIUM CHLORIDE 0.9% FLUSH
10.0000 mL | INTRAVENOUS | Status: DC | PRN
  Administered 2022-05-12: 10 mL

## 2022-05-12 MED ORDER — SODIUM CHLORIDE 0.9 % IV SOLN
200.0000 mg | Freq: Once | INTRAVENOUS | Status: AC
  Administered 2022-05-12: 200 mg via INTRAVENOUS
  Filled 2022-05-12: qty 8

## 2022-05-12 NOTE — Patient Instructions (Signed)
[facility]  Discharge Instructions: Thank you for choosing [facility] to provide your oncology and hematology care.  If you have a lab appointment with the Cancer Center - please note that after April 8th, 2024, all labs will be drawn in the cancer center.  You do not have to check in or register with the main entrance as you have in the past but will complete your check-in in the cancer center.  Wear comfortable clothing and clothing appropriate for easy access to any Portacath or PICC line.   We strive to give you quality time with your provider. You may need to reschedule your appointment if you arrive late (15 or more minutes).  Arriving late affects you and other patients whose appointments are after yours.  Also, if you miss three or more appointments without notifying the office, you may be dismissed from the clinic at the provider's discretion.      For prescription refill requests, have your pharmacy contact our office and allow 72 hours for refills to be completed.    Today you received the following chemotherapy and/or immunotherapy agents Keytruda      To help prevent nausea and vomiting after your treatment, we encourage you to take your nausea medication as directed.  BELOW ARE SYMPTOMS THAT SHOULD BE REPORTED IMMEDIATELY: *FEVER GREATER THAN 100.4 F (38 C) OR HIGHER *CHILLS OR SWEATING *NAUSEA AND VOMITING THAT IS NOT CONTROLLED WITH YOUR NAUSEA MEDICATION *UNUSUAL SHORTNESS OF BREATH *UNUSUAL BRUISING OR BLEEDING *URINARY PROBLEMS (pain or burning when urinating, or frequent urination) *BOWEL PROBLEMS (unusual diarrhea, constipation, pain near the anus) TENDERNESS IN MOUTH AND THROAT WITH OR WITHOUT PRESENCE OF ULCERS (sore throat, sores in mouth, or a toothache) UNUSUAL RASH, SWELLING OR PAIN  UNUSUAL VAGINAL DISCHARGE OR ITCHING   Items with * indicate a potential emergency and should be followed up as soon as possible or go to the  Emergency Department if any problems should occur.  Please show the CHEMOTHERAPY ALERT CARD or IMMUNOTHERAPY ALERT CARD at check-in to the Emergency Department and triage nurse.  Should you have questions after your visit or need to cancel or reschedule your appointment, please contact [facility] [phone]  and follow the prompts.  Office hours are 8:00 a.m. to 4:30 p.m. Monday - Friday. Please note that voicemails left after 4:00 p.m. may not be returned until the following business day.  We are closed weekends and major holidays. You have access to a nurse at all times for urgent questions. Please call the main number to the clinic [phone] and follow the prompts.  For any non-urgent questions, you may also contact your provider using MyChart. We now offer e-Visits for anyone 18 and older to request care online for non-urgent symptoms. For details visit [email or web address].   Also download the MyChart app! Go to the app store, search "MyChart", open the app, select [facility], and log in with your MyChart username and password.   

## 2022-05-12 NOTE — Patient Instructions (Signed)
[facility] Discharge Instructions   You were seen and examined today by Dr. Katragadda.  He reviewed the results of your lab work which are normal/stable.   We will proceed with your treatment today.  Return as scheduled.    Thank you for choosing  [facility] to provide your oncology and hematology care.  To afford each patient quality time with our provider, please arrive at least 15 minutes before your scheduled appointment time.   If you have a lab appointment with the Cancer Center please come in thru the Main Entrance and check in at the main information desk.  You need to re-schedule your appointment should you arrive 10 or more minutes late.  We strive to give you quality time with our providers, and arriving late affects you and other patients whose appointments are after yours.  Also, if you no show three or more times for appointments you may be dismissed from the clinic at the providers discretion.     Again, thank you for choosing [facility].  Our hope is that these requests will decrease the amount of time that you wait before being seen by our physicians.       _____________________________________________________________  Should you have questions after your visit to [facility], please contact our office at [phone] and follow the prompts.  Our office hours are 8:00 a.m. and 4:30 p.m. Monday - Friday.  Please note that voicemails left after 4:00 p.m. may not be returned until the following business day.  We are closed weekends and major holidays.  You do have access to a nurse 24-7, just call the main number to the clinic [phone] and do not press any options, hold on the line and a nurse will answer the phone.    For prescription refill requests, have your pharmacy contact our office and allow 72 hours.    Due to Covid, you will need to wear a mask upon entering  the hospital. If you do not have a mask, a mask will be given to you at the Main Entrance upon arrival. For doctor visits, patients may have 1 support person age 18 or older with them. For treatment visits, patients can not have anyone with them due to social distancing guidelines and our immunocompromised population.      

## 2022-05-12 NOTE — Progress Notes (Signed)
Labs reviewed at office visit today. Will proceed as planned per MD.   Treatment given per orders. Patient tolerated it well without problems. Vitals stable and discharged home from clinic ambulatory. Follow up as scheduled.  

## 2022-05-12 NOTE — Addendum Note (Signed)
Addended by: [name] on: 05/12/2022 05:03 PM   Modules accepted: Orders

## 2022-05-12 NOTE — Progress Notes (Signed)
Patient has been examined by Dr. Katragadda. Vital signs and labs have been reviewed by MD - ANC, Creatinine, LFTs, hemoglobin, and platelets are within treatment parameters per M.D. - pt may proceed with treatment.  Primary RN and pharmacy notified.  

## 2022-05-15 DIAGNOSIS — C049 Malignant neoplasm of floor of mouth, unspecified: Secondary | ICD-10-CM | POA: Diagnosis not present

## 2022-05-15 DIAGNOSIS — C787 Secondary malignant neoplasm of liver and intrahepatic bile duct: Secondary | ICD-10-CM | POA: Diagnosis not present

## 2022-05-16 ENCOUNTER — Other Ambulatory Visit: Payer: Self-pay

## 2022-05-24 DIAGNOSIS — R531 Weakness: Secondary | ICD-10-CM | POA: Diagnosis not present

## 2022-05-26 ENCOUNTER — Other Ambulatory Visit: Payer: Self-pay

## 2022-05-28 ENCOUNTER — Encounter (HOSPITAL_COMMUNITY)
Admission: RE | Admit: 2022-05-28 | Discharge: 2022-05-28 | Disposition: A | Discharge status: Home / Self Care | Payer: 59 | Source: Ambulatory Visit | Attending: Hematology | Admitting: Hematology

## 2022-05-28 DIAGNOSIS — C049 Malignant neoplasm of floor of mouth, unspecified: Secondary | ICD-10-CM | POA: Diagnosis not present

## 2022-05-28 DIAGNOSIS — R000 Tachycardia, unspecified: Secondary | ICD-10-CM | POA: Diagnosis not present

## 2022-05-28 DIAGNOSIS — R062 Wheezing: Secondary | ICD-10-CM | POA: Diagnosis not present

## 2022-05-28 DIAGNOSIS — R61 Generalized hyperhidrosis: Secondary | ICD-10-CM | POA: Diagnosis not present

## 2022-05-28 DIAGNOSIS — I10 Essential (primary) hypertension: Secondary | ICD-10-CM | POA: Diagnosis not present

## 2022-05-28 DIAGNOSIS — C0689 Malignant neoplasm of overlapping sites of other parts of mouth: Secondary | ICD-10-CM | POA: Diagnosis not present

## 2022-05-28 MED ORDER — FLUDEOXYGLUCOSE F - 18 (FDG) INJECTION
8.9500 | Freq: Once | INTRAVENOUS | Status: AC | PRN
  Administered 2022-05-28: 8.95 via INTRAVENOUS

## 2022-05-29 ENCOUNTER — Emergency Department (HOSPITAL_COMMUNITY)
Admission: EM | Admit: 2022-05-29 | Discharge: 2022-05-29 | Disposition: A | Discharge status: Home / Self Care | Payer: 59 | Attending: Student | Admitting: Student

## 2022-05-29 ENCOUNTER — Other Ambulatory Visit: Payer: Self-pay

## 2022-05-29 ENCOUNTER — Emergency Department (HOSPITAL_COMMUNITY): Payer: 59

## 2022-05-29 DIAGNOSIS — I10 Essential (primary) hypertension: Secondary | ICD-10-CM | POA: Insufficient documentation

## 2022-05-29 DIAGNOSIS — Z79899 Other long term (current) drug therapy: Secondary | ICD-10-CM | POA: Insufficient documentation

## 2022-05-29 DIAGNOSIS — Z87891 Personal history of nicotine dependence: Secondary | ICD-10-CM | POA: Insufficient documentation

## 2022-05-29 DIAGNOSIS — Z85810 Personal history of malignant neoplasm of tongue: Secondary | ICD-10-CM | POA: Insufficient documentation

## 2022-05-29 DIAGNOSIS — J9811 Atelectasis: Secondary | ICD-10-CM | POA: Diagnosis not present

## 2022-05-29 DIAGNOSIS — R062 Wheezing: Secondary | ICD-10-CM

## 2022-05-29 DIAGNOSIS — Z85819 Personal history of malignant neoplasm of unspecified site of lip, oral cavity, and pharynx: Secondary | ICD-10-CM | POA: Diagnosis not present

## 2022-05-29 DIAGNOSIS — R0602 Shortness of breath: Secondary | ICD-10-CM | POA: Diagnosis not present

## 2022-05-29 DIAGNOSIS — Z8547 Personal history of malignant neoplasm of testis: Secondary | ICD-10-CM | POA: Insufficient documentation

## 2022-05-29 DIAGNOSIS — R0682 Tachypnea, not elsewhere classified: Secondary | ICD-10-CM | POA: Diagnosis not present

## 2022-05-29 LAB — CBC WITH DIFFERENTIAL/PLATELET
Abs Immature Granulocytes: 0.02 10*3/uL (ref 0.00–0.07)
Basophils Absolute: 0.1 10*3/uL (ref 0.0–0.1)
Basophils Relative: 1 %
Eosinophils Absolute: 0.3 10*3/uL (ref 0.0–0.5)
Eosinophils Relative: 4 %
HCT: 37.6 % — ABNORMAL LOW (ref 39.0–52.0)
Hemoglobin: 12 g/dL — ABNORMAL LOW (ref 13.0–17.0)
Immature Granulocytes: 0 %
Lymphocytes Relative: 24 %
Lymphs Abs: 2.1 10*3/uL (ref 0.7–4.0)
MCH: 31.7 pg (ref 26.0–34.0)
MCHC: 31.9 g/dL (ref 30.0–36.0)
MCV: 99.2 fL (ref 80.0–100.0)
Monocytes Absolute: 1.1 10*3/uL — ABNORMAL HIGH (ref 0.1–1.0)
Monocytes Relative: 12 %
Neutro Abs: 5.3 10*3/uL (ref 1.7–7.7)
Neutrophils Relative %: 59 %
Platelets: 319 10*3/uL (ref 150–400)
RBC: 3.79 MIL/uL — ABNORMAL LOW (ref 4.22–5.81)
RDW: 13.5 % (ref 11.5–15.5)
WBC: 9 10*3/uL (ref 4.0–10.5)
nRBC: 0 % (ref 0.0–0.2)

## 2022-05-29 LAB — COMPREHENSIVE METABOLIC PANEL
ALT: 13 U/L (ref 0–44)
AST: 16 U/L (ref 15–41)
Albumin: 3.9 g/dL (ref 3.5–5.0)
Alkaline Phosphatase: 70 U/L (ref 38–126)
Anion gap: 9 (ref 5–15)
BUN: 18 mg/dL (ref 8–23)
CO2: 25 mmol/L (ref 22–32)
Calcium: 10.4 mg/dL — ABNORMAL HIGH (ref 8.9–10.3)
Chloride: 103 mmol/L (ref 98–111)
Creatinine, Ser: 1 mg/dL (ref 0.61–1.24)
GFR, Estimated: >60 mL/min (ref >60)
Glucose, Bld: 101 mg/dL — ABNORMAL HIGH (ref 70–99)
Potassium: 4.1 mmol/L (ref 3.5–5.1)
Sodium: 137 mmol/L (ref 135–145)
Total Bilirubin: 0.8 mg/dL (ref 0.3–1.2)
Total Protein: 8.6 g/dL — ABNORMAL HIGH (ref 6.5–8.1)

## 2022-05-29 LAB — TROPONIN I (HIGH SENSITIVITY)
Troponin I (High Sensitivity): 25 ng/L — ABNORMAL HIGH (ref <18)
Troponin I (High Sensitivity): 27 ng/L — ABNORMAL HIGH (ref <18)

## 2022-05-29 LAB — BLOOD GAS, VENOUS
Acid-Base Excess: 2 mmol/L (ref 0.0–2.0)
Bicarbonate: 26.5 mmol/L (ref 20.0–28.0)
Drawn by: 1528
O2 Saturation: 90.9 %
Patient temperature: 36.9
pCO2, Ven: 40 mmHg — ABNORMAL LOW (ref 44–60)
pH, Ven: 7.43 (ref 7.25–7.43)
pO2, Ven: 58 mmHg — ABNORMAL HIGH (ref 32–45)

## 2022-05-29 LAB — BRAIN NATRIURETIC PEPTIDE: B Natriuretic Peptide: 46 pg/mL (ref 0.0–100.0)

## 2022-05-29 MED ORDER — RACEPINEPHRINE HCL 2.25 % IN NEBU
0.5000 mL | INHALATION_SOLUTION | Freq: Once | RESPIRATORY_TRACT | Status: AC
  Administered 2022-05-29: 0.5 mL via RESPIRATORY_TRACT
  Filled 2022-05-29: qty 0.5

## 2022-05-29 MED ORDER — IPRATROPIUM-ALBUTEROL 0.5-2.5 (3) MG/3ML IN SOLN
9.0000 mL | Freq: Once | RESPIRATORY_TRACT | Status: AC
  Administered 2022-05-29: 9 mL via RESPIRATORY_TRACT
  Filled 2022-05-29: qty 9

## 2022-05-29 MED ORDER — BUDESONIDE-FORMOTEROL FUMARATE 80-4.5 MCG/ACT IN AERO: 2.0000 | INHALATION_SPRAY | Freq: Two times a day (BID) | RESPIRATORY_TRACT | 12 refills | Status: DC

## 2022-05-29 MED ORDER — PREDNISONE 10 MG PO TABS: 40.0000 mg | ORAL_TABLET | Freq: Every day | ORAL | 0 refills | Status: DC

## 2022-05-29 MED ORDER — METHYLPREDNISOLONE SODIUM SUCC 125 MG IJ SOLR
125.0000 mg | Freq: Once | INTRAMUSCULAR | Status: AC
  Administered 2022-05-29: 125 mg via INTRAVENOUS
  Filled 2022-05-29: qty 2

## 2022-05-29 MED ORDER — ALBUTEROL SULFATE HFA 108 (90 BASE) MCG/ACT IN AERS
1.0000 | INHALATION_SPRAY | Freq: Once | RESPIRATORY_TRACT | Status: AC
  Administered 2022-05-29: 1 via RESPIRATORY_TRACT
  Filled 2022-05-29: qty 6.7

## 2022-05-29 NOTE — ED Triage Notes (Addendum)
Pt BIB [facility] from home, states he has felt sick for about two weeks with cough and SOB, feels like it is getting worse. Per EMS pt was 96% on RA but had some wheezing, albuterol neb given PTA.   Pt has hx of mouth CA.

## 2022-05-29 NOTE — ED Notes (Signed)
Portable xray at bedside.

## 2022-05-29 NOTE — ED Provider Notes (Signed)
[place] EMERGENCY DEPARTMENT AT [facility] Provider Note  CSN: [id number] Arrival date & time: 05/29/22 0001  Chief Complaint(s) Shortness of Breath  HPI Nathaniel Hicks is a 82 y.o. 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  Patient states that over the last 2 weeks he has had progressively worsening shortness of breath particular with exertion.  Patient with audible wheezing on arrival and patient received 1 albuterol treatment prior to arrival.  No formal diagnosis of COPD and patient currently does not have any inhalers that he uses at home.  On arrival, patient is tachypneic with mild accessory muscle use and expiratory wheezing.   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HCC) 10/14/2021   Abscess of oral space 07/23/2021   Tobacco use disorder 07/23/2021   Physical deconditioning 07/23/2021   Sepsis (HCC) 07/23/2021   Elevated troponin 07/23/2021   Left facial swelling 07/14/2021   BPH with obstruction/lower urinary tract symptoms 11/05/2020   History of primary testicular cancer; Stage 1b seminoma; s/p right radical orchiectomy 12/14 11/05/2020   Hypertension 11/02/2017   Vitamin  D deficiency 11/02/2017   Hyperlipidemia 11/02/2017   Meniscus, lateral, derangement, right    S/P right knee arthroscopy 08/31/17 09/07/2017   Testicle cancer, 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 Authorizing Provider  acetaminophen (TYLENOL) 650 MG CR tablet Take 650 mg by mouth every 8 (eight) hours as needed for pain.    [provider]  amLODipine (NORVASC) 10 MG tablet Take 1 tablet (10 mg total) by mouth daily. 11/06/21   Love, Evlyn Kanner, PA-C  amLODipine-benazepril (LOTREL) 10-40 MG capsule Take 1 capsule by mouth daily. 08/04/21   [provider]  carvedilol (COREG) 6.25 MG tablet Take 1 tablet (6.25 mg total) by mouth 2 (two) times daily with a meal. 11/06/21   Love, Evlyn Kanner, PA-C  Cholecalciferol (VITAMIN D3) 50 MCG (2000 UT) TABS Take 2,000 Units by mouth daily.    [provider]  hydrALAZINE (APRESOLINE) 25 MG tablet Take 25 mg by mouth 2 (two) times daily. 11/19/21   [provider]  HYDROcodone-acetaminophen (HYCET) 7.5-325 mg/15 ml solution Take 15 mLs by mouth every 6 (six) hours as needed for moderate pain. 05/12/22   [name], MD  magnesium oxide (MAG-OX) 400 MG tablet Take 400 mg by mouth daily. 08/27/21   [provider]  Menthol, Topical Analgesic, (BIOFREEZE EX) Apply 1 application  topically daily as needed (pain).    [provider]  naloxone [facility]) nasal spray 4 mg/0.1 mL SMARTSIG:Both Nares 04/11/22  [provider]  oxyCODONE (OXY IR/ROXICODONE) 5 MG immediate release tablet Take 5 mg by mouth 3 (three) times daily as needed for moderate pain. 11/13/21   [provider]  PEMBROLIZUMAB IV Inject into the vein every 21 ( twenty-one) days. 03/10/22   [provider]  Polyethyl Glycol-Propyl Glycol (GOODSENSE LUBRICANT EYE DROPS OP) Place 1 drop into both eyes daily as needed (dry eyes).    [provider]  pravastatin (PRAVACHOL) 80 MG tablet Take 80 mg by mouth daily.    [provider]  RAPAFLO 8 MG CAPS capsule Take 8 mg by mouth daily. Silodosin 10/13/13   [provider]  vitamin B-12 (CYANOCOBALAMIN) 500 MCG tablet Take 500 mcg by mouth daily.    [provider]                                                                                                                                    Past Surgical History Past Surgical History:  Procedure Laterality Date   COLONOSCOPY N/A 11/24/2012   Procedure: COLONOSCOPY;  Surgeon: Malissa Hippo, MD;  [facility];  Service: Endoscopy;  Laterality: N/A;  830-moved to 730 Ann notified pt   FLOOR OF MOUTH BIOPSY N/A 10/14/2021   Procedure: FLOOR OF MOUTH RESECTION;  Surgeon: Christia Reading, MD;  Location: [facility] OR;  Service: ENT;  Laterality: N/A;   HEMORROIDECTOMY     KNEE ARTHROSCOPY WITH LATERAL MENISECTOMY Right 08/31/2017   Procedure: KNEE ARTHROSCOPY WITH LATERAL MENISECTOMY;  Surgeon: Vickki Hearing, MD;  Location: [facility] ORS;  Service: Orthopedics;  Laterality: Right;   LESION EXCISION N/A 03/23/2012   Procedure: EXCISION NEOPLASM SCALP ;  Surgeon: Dalia Heading, MD;  Location: [facility] ORS;  Service: General;  Laterality: N/A;  Excision of Scalp Neoplasm   ORCHIECTOMY Right 12/20/2012   Procedure: RIGHT RADICAL ORCHIECTOMY/POSSIBLE BX RIGHT TESTICLE;  Surgeon: Ky Barban, MD;  Location: [facility] ORS;  Service: Urology;  Laterality: Right;   PORTACATH PLACEMENT Left 03/25/2022   Procedure: INSERTION PORT-A-CATH;  Surgeon: Franky Macho, MD;  Location: [facility] ORS;  Service: General;  Laterality: Left;   PROSTATE SURGERY     RADICAL NECK DISSECTION Bilateral 10/14/2021   Procedure: NECK DISSECTION;  Surgeon: Christia Reading, MD;  Location: [facility] OR;  Service: ENT;  Laterality: Bilateral;   SCALP LACERATION REPAIR     [facility]-Dr Katrinka Blazing   SKIN FULL THICKNESS GRAFT Bilateral 10/14/2021   Procedure: PLATYSMA FLAP  CLOSURE;  Surgeon: Christia Reading, MD;  Location: [facility] OR;  Service: ENT;  Laterality: Bilateral;   TOOTH EXTRACTION  10/14/2021   Procedure: DENTAL EXTRACTIONS;  Surgeon: Christia Reading, MD;  Location: [facility] OR;  Service: ENT;;   TRACHEOSTOMY TUBE PLACEMENT N/A 10/14/2021   Procedure: TRACHEOSTOMY;  Surgeon: Christia Reading, MD;  Location: [facility] OR;  Service: ENT;  Laterality: N/A;   Family History Family History  Problem Relation Age of Onset  Cancer Brother    Cancer Brother     Social History Social History   Tobacco Use   Smoking status: Former    Packs/day: 0.25    Years: 50.00    Additional pack years: 0.00    Total pack years: 12.50    Types: Cigarettes   Smokeless tobacco: Never  Vaping Use   Vaping Use: Never used  Substance Use Topics   Alcohol use: Not Currently   Drug use: No   Allergies Patient has no known allergies.  Review of Systems Review of Systems  Respiratory:  Positive for shortness of breath and wheezing.     Physical Exam Vital Signs  I have reviewed the triage vital signs BP 137/80   Pulse (!) 111   Temp 98.4 F (36.9 C) (Axillary)   Resp (!) 28   Ht 5\' 11"  (1.803 m)   Wt 79 kg   SpO2 95%   BMI 24.29 kg/m   Physical Exam Constitutional:      General: He is not in acute distress.    Appearance: Normal appearance.  HENT:     Head: Normocephalic and atraumatic.     Nose: No congestion or rhinorrhea.  Eyes:     General:        Right eye: No discharge.        Left eye: No discharge.     Extraocular Movements: Extraocular movements intact.     Pupils: Pupils are equal, round, and reactive to light.  Cardiovascular:     Rate and Rhythm: Normal rate and regular rhythm.     Heart sounds: No murmur heard. Pulmonary:     Effort: Tachypnea and respiratory distress present.     Breath sounds: Stridor present. Wheezing present. No rales.  Abdominal:     General: There is no distension.     Tenderness: There is no abdominal tenderness.   Musculoskeletal:        General: Normal range of motion.     Cervical back: Normal range of motion.  Skin:    General: Skin is warm and dry.  Neurological:     General: No focal deficit present.     Mental Status: He is alert.     ED Results and Treatments Labs (all labs ordered are listed, but only abnormal results are displayed) Labs Reviewed  COMPREHENSIVE METABOLIC PANEL - Abnormal; Notable for the following components:      Result Value   Glucose, Bld 101 (*)    Calcium 10.4 (*)    Total Protein 8.6 (*)    All other components within normal limits  CBC WITH DIFFERENTIAL/PLATELET - Abnormal; Notable for the following components:   RBC 3.79 (*)    Hemoglobin 12.0 (*)    HCT 37.6 (*)    Monocytes Absolute 1.1 (*)    All other components within normal limits  BLOOD GAS, VENOUS - Abnormal; Notable for the following components:   pCO2, Ven 40 (*)    pO2, Ven 58 (*)    All other components within normal limits  TROPONIN I (HIGH SENSITIVITY) - Abnormal; Notable for the following components:   Troponin I (High Sensitivity) 27 (*)    All other components within normal limits  BRAIN NATRIURETIC PEPTIDE  Radiology DG Chest Portable 1 View  Result Date: 05/29/2022 CLINICAL DATA:  Shortness of breath, wheezing 3 EXAM: PORTABLE CHEST 1 VIEW COMPARISON:  03/25/2022 FINDINGS: Left Port-A-Cath remains in place, unchanged. Heart is borderline in size. Bibasilar atelectasis. No effusions. No acute bony abnormality. IMPRESSION: Bibasilar atelectasis. Electronically Signed   By: Charlett Nose M.D.   On: 05/29/2022 00:39    Pertinent labs & imaging results that were available during my care of the patient were reviewed by me and considered in my medical decision making (see MDM for details).  Medications Ordered in ED Medications  ipratropium-albuterol (DUONEB) 0.5-2.5  (3) MG/3ML nebulizer solution 9 mL (9 mLs Nebulization Given 05/29/22 0028)  methylPREDNISolone sodium succinate (SOLU-MEDROL) 125 mg/2 mL injection 125 mg (125 mg Intravenous Given 05/29/22 0033)                                                                                                                                     Procedures .Critical Care  Performed by: Glendora Score, MD Authorized by: Glendora Score, MD   Critical care provider statement:    Critical care time (minutes):  30   Critical care was necessary to treat or prevent imminent or life-threatening deterioration of the following conditions:  Respiratory failure   Critical care was time spent personally by me on the following activities:  Development of treatment plan with patient or surrogate, discussions with consultants, evaluation of patient's response to treatment, examination of patient, ordering and review of laboratory studies, ordering and review of radiographic studies, ordering and performing treatments and interventions, pulse oximetry, re-evaluation of patient's condition and review of old charts   (including critical care time)  Medical Decision Making / ED Course   This patient presents to the ED for concern of shortness of breath, this involves an extensive number of treatment options, and is a complaint that carries with it a high risk of complications and morbidity.  The differential diagnosis includes Pe, PTX, Pulmonary Edema, ARDS, COPD/Asthma, ACS, CHF exacerbation, Arrhythmia, Pericardial Effusion/Tamponade, Anemia, Sepsis, Acidosis/Hypercapnia, Anxiety, Viral URI, upper airway obstruction  MDM: Patient seen emergency room for evaluation of shortness of breath.  Physical exam with tachypnea, mild accessory muscle use and wheezing bilaterally with a small amount of stridor likely from his head neck cancer.  Laboratory evaluation with a hemoglobin of 12.0, pH 7.4 with no significant hypercarbia, mild  hypercalcemia 10.4, high-sensitivity troponin 27 with delta troponin 25.  BNP is normal.  Chest x-ray unremarkable.  Patient had a PET scan done yesterday and I did speak with radiologist tonight who briefly looked at the CT imaging associated with his PET scan and we are both in agreements that there does not appear to be a significant amount of upper airway obstruction around the cords to explain patient's level of shortness of breath.  Patient received 3 DuoNebs and methylprednisolone and on reevaluation his breathing had significant improved.  There was a faint amount of wheezing heard after this and we did a small trial of racemic epinephrine to see if there would be any improvement.  Breathing possibly minimally improved but not nearly as much as was seen with the DuoNebs.  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  Patient will be given an albuterol inhaler here for rescue therapy, Symbicort sent to his pharmacy and will be on 4 additional days of prednisone.  Reassured decision-making at bedside to discuss hospital admission versus home with outpatient follow-up.  Patient is requesting to be discharged with outpatient follow-up and an amatory pulse ox was performed where patient maintaining oxygen saturations with ambulation.  Thus, patient discharged with outpatient follow-up and strict return precautions.   Additional history obtained: -Additional history obtained from multiple family members -External records from outside source obtained and reviewed including: Chart review including previous notes, labs, imaging, consultation notes   Lab Tests: -I ordered, reviewed, and interpreted labs.   The pertinent results include:   Labs Reviewed  COMPREHENSIVE METABOLIC PANEL - Abnormal; Notable for the following components:      Result Value   Glucose, Bld  101 (*)    Calcium 10.4 (*)    Total Protein 8.6 (*)    All other components within normal limits  CBC WITH DIFFERENTIAL/PLATELET - Abnormal; Notable for the following components:   RBC 3.79 (*)    Hemoglobin 12.0 (*)    HCT 37.6 (*)    Monocytes Absolute 1.1 (*)    All other components within normal limits  BLOOD GAS, VENOUS - Abnormal; Notable for the following components:   pCO2, Ven 40 (*)    pO2, Ven 58 (*)    All other components within normal limits  TROPONIN I (HIGH SENSITIVITY) - Abnormal; Notable for the following components:   Troponin I (High Sensitivity) 27 (*)    All other components within normal limits  BRAIN NATRIURETIC PEPTIDE      EKG  Left bundle branch block, sinus tachycardia, Sgarbossa negative no obvious evidence of ischemia    Imaging Studies ordered: I ordered imaging studies including CXR I independently visualized and interpreted imaging. I agree with the radiologist interpretation   Medicines ordered and prescription drug management: Meds ordered this encounter  Medications   ipratropium-albuterol (DUONEB) 0.5-2.5 (3) MG/3ML nebulizer solution 9 mL   methylPREDNISolone sodium succinate (SOLU-MEDROL) 125 mg/2 mL injection 125 mg    -I have reviewed the patients home medicines and have made adjustments as needed  Critical interventions Multiple DuoNebs, racemic epi, steroids  Cardiac Monitoring: The patient was maintained on a cardiac monitor.  I personally viewed and interpreted the cardiac monitored which showed an underlying rhythm of: NSR, LBBB  Social Determinants of Health:  Factors impacting patients care include: none   Reevaluation: After the interventions noted above, I reevaluated the patient and found that they have :improved  Co morbidities that complicate the patient evaluation  Past Medical History:  Diagnosis Date   Arthritis    BPH (benign prostatic hyperplasia)    Difficult intubation    HOH (hard of hearing)     Hyperlipidemia 11/02/2017   Hypertension    Hypertension 11/02/2017   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HCC)    2014   Vitamin D deficiency 11/02/2017      Dispostion: I considered admission for this patient, but at this time, using  shared decision making we decided on a plan for outpatient follow-up with strict return precautions.  Patient then discharged     Final Clinical Impression(s) / ED Diagnoses Final diagnoses:  None     @PCDICTATION @    Armond Cuthrell, Wyn Forster, MD 05/29/22 [phone]

## 2022-06-02 ENCOUNTER — Inpatient Hospital Stay: Payer: 59 | Attending: Hematology

## 2022-06-02 ENCOUNTER — Inpatient Hospital Stay (HOSPITAL_BASED_OUTPATIENT_CLINIC_OR_DEPARTMENT_OTHER): Payer: 59 | Admitting: Hematology

## 2022-06-02 ENCOUNTER — Inpatient Hospital Stay: Payer: 59

## 2022-06-02 ENCOUNTER — Other Ambulatory Visit: Payer: Self-pay | Admitting: *Deleted

## 2022-06-02 VITALS — BP 116/52 | HR 78 | Temp 97.3°F | Resp 19 | Wt 169.0 lb

## 2022-06-02 VITALS — BP 142/51 | HR 60 | Temp 96.6°F | Resp 18

## 2022-06-02 DIAGNOSIS — Z9079 Acquired absence of other genital organ(s): Secondary | ICD-10-CM | POA: Diagnosis not present

## 2022-06-02 DIAGNOSIS — C049 Malignant neoplasm of floor of mouth, unspecified: Secondary | ICD-10-CM

## 2022-06-02 DIAGNOSIS — C787 Secondary malignant neoplasm of liver and intrahepatic bile duct: Secondary | ICD-10-CM | POA: Diagnosis not present

## 2022-06-02 DIAGNOSIS — Z79620 Long term (current) use of immunosuppressive biologic: Secondary | ICD-10-CM | POA: Insufficient documentation

## 2022-06-02 DIAGNOSIS — Z87891 Personal history of nicotine dependence: Secondary | ICD-10-CM | POA: Diagnosis not present

## 2022-06-02 DIAGNOSIS — Z8547 Personal history of malignant neoplasm of testis: Secondary | ICD-10-CM | POA: Diagnosis not present

## 2022-06-02 DIAGNOSIS — Z95828 Presence of other vascular implants and grafts: Secondary | ICD-10-CM

## 2022-06-02 DIAGNOSIS — Z5112 Encounter for antineoplastic immunotherapy: Secondary | ICD-10-CM | POA: Insufficient documentation

## 2022-06-02 DIAGNOSIS — D539 Nutritional anemia, unspecified: Secondary | ICD-10-CM | POA: Diagnosis not present

## 2022-06-02 LAB — CBC WITH DIFFERENTIAL/PLATELET
Abs Immature Granulocytes: 0.08 K/uL — ABNORMAL HIGH (ref 0.00–0.07)
Basophils Absolute: 0 K/uL (ref 0.0–0.1)
Basophils Relative: 0 %
Eosinophils Absolute: 0.1 K/uL (ref 0.0–0.5)
Eosinophils Relative: 1 %
HCT: 34.3 % — ABNORMAL LOW (ref 39.0–52.0)
Hemoglobin: 11.2 g/dL — ABNORMAL LOW (ref 13.0–17.0)
Immature Granulocytes: 1 %
Lymphocytes Relative: 19 %
Lymphs Abs: 1.8 K/uL (ref 0.7–4.0)
MCH: 32.5 pg (ref 26.0–34.0)
MCHC: 32.7 g/dL (ref 30.0–36.0)
MCV: 99.4 fL (ref 80.0–100.0)
Monocytes Absolute: 1 K/uL (ref 0.1–1.0)
Monocytes Relative: 11 %
Neutro Abs: 6.2 K/uL (ref 1.7–7.7)
Neutrophils Relative %: 68 %
Platelets: 357 K/uL (ref 150–400)
RBC: 3.45 MIL/uL — ABNORMAL LOW (ref 4.22–5.81)
RDW: 13.6 % (ref 11.5–15.5)
WBC: 9.2 K/uL (ref 4.0–10.5)
nRBC: 0 % (ref 0.0–0.2)

## 2022-06-02 LAB — COMPREHENSIVE METABOLIC PANEL
ALT: 17 U/L (ref 0–44)
AST: 18 U/L (ref 15–41)
Albumin: 3.4 g/dL — ABNORMAL LOW (ref 3.5–5.0)
Alkaline Phosphatase: 61 U/L (ref 38–126)
Anion gap: 6 (ref 5–15)
BUN: 17 mg/dL (ref 8–23)
CO2: 28 mmol/L (ref 22–32)
Calcium: 10 mg/dL (ref 8.9–10.3)
Chloride: 102 mmol/L (ref 98–111)
Creatinine, Ser: 0.91 mg/dL (ref 0.61–1.24)
GFR, Estimated: >60 mL/min (ref >60)
Glucose, Bld: 100 mg/dL — ABNORMAL HIGH (ref 70–99)
Potassium: 3.9 mmol/L (ref 3.5–5.1)
Sodium: 136 mmol/L (ref 135–145)
Total Bilirubin: 0.5 mg/dL (ref 0.3–1.2)
Total Protein: 7.5 g/dL (ref 6.5–8.1)

## 2022-06-02 LAB — MAGNESIUM: Magnesium: 2.1 mg/dL (ref 1.7–2.4)

## 2022-06-02 MED ORDER — SODIUM CHLORIDE 0.9% FLUSH
10.0000 mL | Freq: Once | INTRAVENOUS | Status: AC
  Administered 2022-06-02: 10 mL via INTRAVENOUS

## 2022-06-02 MED ORDER — SODIUM CHLORIDE 0.9% FLUSH
10.0000 mL | INTRAVENOUS | Status: DC | PRN
  Administered 2022-06-02: 10 mL

## 2022-06-02 MED ORDER — HEPARIN SOD (PORK) LOCK FLUSH 100 UNIT/ML IV SOLN
500.0000 [IU] | Freq: Once | INTRAVENOUS | Status: AC | PRN
  Administered 2022-06-02: 500 [IU]

## 2022-06-02 MED ORDER — HYDROCODONE-ACETAMINOPHEN 7.5-325 MG/15ML PO SOLN: 15.0000 mL | Freq: Four times a day (QID) | ORAL | 0 refills | Status: DC | PRN

## 2022-06-02 MED ORDER — SODIUM CHLORIDE 0.9 % IV SOLN
200.0000 mg | Freq: Once | INTRAVENOUS | Status: AC
  Administered 2022-06-02: 200 mg via INTRAVENOUS
  Filled 2022-06-02: qty 8

## 2022-06-02 MED ORDER — SODIUM CHLORIDE 0.9 % IV SOLN: Freq: Once | INTRAVENOUS | Status: AC

## 2022-06-02 NOTE — Progress Notes (Signed)
Patient presents today for Keytruda infusion per providers order.  Vital signs and labs reviewed by MD.  Message received from Allison Anderson RN/Dr. Katragadda patient okay for treatment.  Treatment given today per MD orders.  Stable during infusion without adverse affects.  Vital signs stable.  No complaints at this time.  Discharge from clinic ambulatory in stable condition.  Alert and oriented X 3.  Follow up with [facility] as scheduled.  

## 2022-06-02 NOTE — Patient Instructions (Signed)
[facility] at [facility] Discharge Instructions   You were seen and examined today by Dr. Ellin Saba.  He reviewed the results of your lab work which are normal/stable.   He reviewed the results of your scan which shows the liver lesion was stable.   Dr. Kirtland Bouchard recommends you see the radiation doctor, Dr. Langston Masker, in [place] to get some radiation treatments to the floor of the mouth.   We will proceed with your treatment today.   Return as scheduled.    Thank you for choosing [facility] at [facility] to provide your oncology and hematology care.  To afford each patient quality time with our provider, please arrive at least 15 minutes before your scheduled appointment time.   If you have a lab appointment with the Cancer Center please come in thru the Main Entrance and check in at the main information desk.  You need to re-schedule your appointment should you arrive 10 or more minutes late.  We strive to give you quality time with our providers, and arriving late affects you and other patients whose appointments are after yours.  Also, if you no show three or more times for appointments you may be dismissed from the clinic at the providers discretion.     Again, thank you for choosing [facility].  Our hope is that these requests will decrease the amount of time that you wait before being seen by our physicians.       _____________________________________________________________  Should you have questions after your visit to [facility], please contact our office at [phone] and follow the prompts.  Our office hours are 8:00 a.m. and 4:30 p.m. Monday - Friday.  Please note that voicemails left after 4:00 p.m. may not be returned until the following business day.  We are closed weekends and major holidays.  You do have access to a nurse 24-7, just call the main number to the clinic [phone] and do not press any options,  hold on the line and a nurse will answer the phone.    For prescription refill requests, have your pharmacy contact our office and allow 72 hours.    Due to Covid, you will need to wear a mask upon entering the hospital. If you do not have a mask, a mask will be given to you at the Main Entrance upon arrival. For doctor visits, patients may have 1 support person age 44 or older with them. For treatment visits, patients can not have anyone with them due to social distancing guidelines and our immunocompromised population.

## 2022-06-02 NOTE — Progress Notes (Signed)
Patient has been examined by Dr. Katragadda. Vital signs and labs have been reviewed by MD - ANC, Creatinine, LFTs, hemoglobin, and platelets are within treatment parameters per M.D. - pt may proceed with treatment.  Primary RN and pharmacy notified.  

## 2022-06-02 NOTE — Patient Instructions (Signed)
[facility] [place]  Discharge Instructions: Thank you for choosing [facility] to provide your oncology and hematology care.  If you have a lab appointment with the Cancer Center - please note that after April 8th, 2024, all labs will be drawn in the cancer center.  You do not have to check in or register with the main entrance as you have in the past but will complete your check-in in the cancer center.  Wear comfortable clothing and clothing appropriate for easy access to any Portacath or PICC line.   We strive to give you quality time with your provider. You may need to reschedule your appointment if you arrive late (15 or more minutes).  Arriving late affects you and other patients whose appointments are after yours.  Also, if you miss three or more appointments without notifying the office, you may be dismissed from the clinic at the provider's discretion.      For prescription refill requests, have your pharmacy contact our office and allow 72 hours for refills to be completed.    Today you received the following chemotherapy and/or immunotherapy agents Keytruda      To help prevent nausea and vomiting after your treatment, we encourage you to take your nausea medication as directed.  BELOW ARE SYMPTOMS THAT SHOULD BE REPORTED IMMEDIATELY: *FEVER GREATER THAN 100.4 F (38 C) OR HIGHER *CHILLS OR SWEATING *NAUSEA AND VOMITING THAT IS NOT CONTROLLED WITH YOUR NAUSEA MEDICATION *UNUSUAL SHORTNESS OF BREATH *UNUSUAL BRUISING OR BLEEDING *URINARY PROBLEMS (pain or burning when urinating, or frequent urination) *BOWEL PROBLEMS (unusual diarrhea, constipation, pain near the anus) TENDERNESS IN MOUTH AND THROAT WITH OR WITHOUT PRESENCE OF ULCERS (sore throat, sores in mouth, or a toothache) UNUSUAL RASH, SWELLING OR PAIN  UNUSUAL VAGINAL DISCHARGE OR ITCHING   Items with * indicate a potential emergency and should be followed up as soon as possible or go to the  Emergency Department if any problems should occur.  Please show the CHEMOTHERAPY ALERT CARD or IMMUNOTHERAPY ALERT CARD at check-in to the Emergency Department and triage nurse.  Should you have questions after your visit or need to cancel or reschedule your appointment, please contact [facility] [place] [facility] [phone]  and follow the prompts.  Office hours are 8:00 a.m. to 4:30 p.m. Monday - Friday. Please note that voicemails left after 4:00 p.m. may not be returned until the following business day.  We are closed weekends and major holidays. You have access to a nurse at all times for urgent questions. Please call the main number to the clinic [phone] and follow the prompts.  For any non-urgent questions, you may also contact your provider using MyChart. We now offer e-Visits for anyone 18 and older to request care online for non-urgent symptoms. For details visit [email or web address].   Also download the MyChart app! Go to the app store, search "MyChart", open the app, select [facility], and log in with your MyChart username and password.   

## 2022-06-02 NOTE — Progress Notes (Signed)
Nathaniel Hicks [address] [place] [address]    Clinic Day:  06/02/2022  Referring physician: [name], MD  Patient Care Team: Nathaniel Stabile, MD as PCP - General (Internal Medicine) Nathaniel Askew, MD as Consulting Physician (Urology) Nathaniel Massed, MD as Medical Oncologist (Medical Oncology) Nathaniel Sarah, RN as Oncology Nurse Navigator (Medical Oncology)   ASSESSMENT & PLAN:   Assessment: 1.  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  Enlarged contralateral right submandibular node.  Enlargement of the left submandibular gland probably reactive.  No abscess. - Biopsy (07/18/2021) floor of the mouth: Invasive well to moderately differentiated keratinizing squamous cell carcinoma.  Tumor cells negative for p16. - 10/14/2021: Floor of the mouth resection, tracheostomy, bilateral selective neck dissections zones 1-3 by Dr. Jenne Hicks - Pathology: Invasive moderately differentiated keratinizing SCC, 4.1 cm, carcinoma invades for a depth of about 1.9 cm and involves saliva gland tissue.  Anterior, posterior, right, left resection margins are involved.  Deep resection margin is negative.  Metastatic carcinoma 1/3 level 1 lymph nodes.  3 level 2 and 3 level 3 lymph nodes negative for carcinoma.  0/10 lymph nodes involved in the left side neck zone 1, 2, 3 dissection.  ENE not identified.  LVI/perineural invasion not identified. - I have talked to pathologist.  Reexcision margins were considered negative.  He has a high risk feature which is T4 tumor. -  PET scan on 12/12/2021: Soft tissue thickening and calcification along the ventral aspect of the trachea with associated hypermetabolism corresponding to recent tracheostomy site.  7 mm right  paratracheal lymph node new with SUV 2.4.  Hypermetabolism along the dome of the right hepatic lobe with probable 2.4 cm low-attenuation lesion - Liver lesion biopsy (01/28/2022): Metastatic squamous cell carcinoma, keratinizing. - NGS testing: PD-L1 (22 C3): CPS: 100, CD274 (PD-L1) amplified, JAK2 amplified, PIK3CA pathogenic variant exon 10, T p53 pathogenic variant, MS-stable, TMB-low - Per Dr. Jenne Hicks, he also has local recurrence of disease in the floor of the mouth. - Cycle 1 of Keytruda started on 03/10/2022.   2.  Social/family history: - He lives in his apartment by himself and is independent of ADLs and IADLs.  He does not drive.  He worked as a Curator and several other jobs.  Quit smoking 1 month ago.  He smoked 1 pack/week for more than 50 years. - 1 brother had agent orange related cancer.  Another brother also had cancer, type unknown to the patient.   3.  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 - He was on close surveillance rather than adjuvant chemotherapy.    Plan: 1.  Stage IV (PT4PN1 M1) SCC of the floor of the mouth, p16 negative: - He has completed 4 cycles of pembrolizumab.  No immunotherapy related side effects. - PET scan (05/28/2022): Hypermetabolic mass in the dome of the liver stable from 12/11/2021.  Mass along the ventral floor of the mouth measures 2.8 x 2.9 cm with SUV 16.  Submental lymph node measures 7 mm.  Right level two 6 mm node and level three 7 mm nodes. - I have recommended radiation therapy for the local recurrence. - Recommend continuing immunotherapy.  Labs today: Normal LFTs and creatinine.  Last TSH  2.2.  Continue Keytruda every 3 weeks.   2.  Oral pain: - This is from tumor recurrence in the lower jaw. - Continue hydrocodone liquid twice daily as needed. - He is having lower teeth extracted on Friday. - Given the local recurrence, recommend radiation therapy.  He is agreeable for treatment.   3.  Macrocytic  anemia: - Hemoglobin improved to 11.2 today.  Closely monitor.    No orders of the defined types were placed in this encounter.     I,Nathaniel Hicks,acting as a Neurosurgeon for Nathaniel Massed, MD.,have documented all relevant documentation on the behalf of Nathaniel Massed, MD,as directed by  Nathaniel Massed, MD while in the presence of Nathaniel Massed, MD.   I, Nathaniel Massed MD, have reviewed the above documentation for accuracy and completeness, and I agree with the above.   Nathaniel Massed, MD   5/21/20245:34 PM  CHIEF COMPLAINT:   Diagnosis: squamous cell carcinoma of the floor of the mouth and testicular seminoma    Cancer Staging  Cancer of floor of mouth [facility]) Staging form: Oral Cavity, AJCC 8th Edition - Clinical stage from 11/25/2021: Stage IVC (cT4a, cN1, pM1) - Signed by Nathaniel Massed, MD on 02/04/2022  Testicle cancer, right Staging form: Testis, AJCC 7th Edition - Clinical: Stage I (T2, N0, M0) - Signed by Nathaniel Newer, PA-C on 07/23/2013    Prior Therapy: none  Current Therapy:  Pembrolizumab    HISTORY OF PRESENT ILLNESS:   Oncology History  Testicle cancer, right  12/20/2012 Surgery   Right total orchiectomyt- 1. Testis, biopsy, right - SEMINOMA. PLEASE SEE COMMENT. 2. Testis, tumor, right - SEMINOMA, 7.5 CM. - ANGIOLYMPHATIC INVASION PRESENT. - RESECTION MARGINS, NEGATIVE FOR ATYPIA OR MALIGNANCY.   01/18/2013 PET scan   Status post right orchiectomy. No findings specific for metastatic disease. Small para-aortic nodes measuring up to 5 mm short axis, without convincing hypermetabolism. Given location, attention on follow-up is suggested.   04/17/2013 Imaging   CT CAP- No evidence of metastatic disease in the chest, abdomen or pelvis. Proximal LAD coronary artery calcification.   07/20/2013 Imaging   CT abd/pelvis- No evidence of metastatic disease in the abdomen or pelvis.   10/24/2013 Imaging   CT abd/pelvis- No  findings to suggest metastatic disease in the abdomen or pelvis   10/24/2013 Imaging   Chest xray- Probable COPD.  Negative for metastatic disease.   02/21/2014 Imaging   CT abd/pelvis- Stable abdominal pelvic CT status post right orchectomy. No evidence of adenopathy or other metastatic disease.   02/21/2014 Imaging   Chest xray- Left lower lobe mild atelectasis and/or infiltrate.     09/10/2014 Imaging   CT abd/pelvis- Status post right orchiectomy.   No evidence of metastatic disease.   3 mm nonobstructing right upper pole renal calculus. No hydronephrosis.   03/14/2015 Imaging   CT abd/pelvis- Stable exam. No evidence of metastatic disease or other acute findings within the abdomen or pelvis.   09/13/2015 Imaging   CT abd/pelvis- No acute findings and no evidence for mass or adenopathy.    04/24/2016 Imaging   CT abd/pelvis: IMPRESSION: 1. Stable exam. No new or progressive findings. No features to suggest metastatic disease.   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HCC)  10/14/2021 Initial Diagnosis   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HCC)   11/25/2021 Cancer Staging   Staging form: Oral Cavity, AJCC 8th Edition - Clinical stage from 11/25/2021: Stage IVC (cT4a, cN1, pM1) - Signed by Nathaniel Massed, MD on 02/04/2022  Histopathologic type: Squamous cell carcinoma, NOS Stage prefix: Initial diagnosis Histologic grade (G): G2 Histologic grading system: 3 grade system   03/10/2022 -  Chemotherapy   Patient is on Treatment Plan : HEAD/NECK Pembrolizumab (200) q21d        INTERVAL HISTORY:   Nathaniel Hicks is a 82 y.o. male presenting to clinic today for follow up of squamous cell carcinoma of the floor of the mouth and testicular seminoma. He was last seen by me on 05/12/22.  Since his last visit, he underwent restaging PET scan on 05/28/22 showing: hypermetabolic floor of mouth mass with cervical nodal and right hepatic lobe metastases; enlarged prostate.  Today, he states that he is doing well overall.  His appetite level is at 80%. His energy level is at 75%.  PAST MEDICAL HISTORY:   Past Medical History: Past Medical History:  Diagnosis Date   Arthritis    BPH (benign prostatic hyperplasia)    Difficult intubation    HOH (hard of hearing)    Hyperlipidemia 11/02/2017   Hypertension    Hypertension 11/02/2017   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HCC)    2014   Vitamin D deficiency 11/02/2017    Surgical History: Past Surgical History:  Procedure Laterality Date   COLONOSCOPY N/A 11/24/2012   Procedure: COLONOSCOPY;  Surgeon: Malissa Hippo, MD;  Location: [facility];  Service: Endoscopy;  Laterality: N/A;  830-moved to 730 Ann notified pt   FLOOR OF MOUTH BIOPSY N/A 10/14/2021   Procedure: FLOOR OF MOUTH RESECTION;  Surgeon: Christia Reading, MD;  Location: [facility] OR;  Service: ENT;  Laterality: N/A;   HEMORROIDECTOMY     KNEE ARTHROSCOPY WITH LATERAL MENISECTOMY Right 08/31/2017   Procedure: KNEE ARTHROSCOPY WITH LATERAL MENISECTOMY;  Surgeon: Vickki Hearing, MD;  Location: [facility] ORS;  Service: Orthopedics;  Laterality: Right;   LESION EXCISION N/A 03/23/2012   Procedure: EXCISION NEOPLASM SCALP ;  Surgeon: Dalia Heading, MD;  Location: [facility] ORS;  Service: General;  Laterality: N/A;  Excision of Scalp Neoplasm   ORCHIECTOMY Right 12/20/2012   Procedure: RIGHT RADICAL ORCHIECTOMY/POSSIBLE BX RIGHT TESTICLE;  Surgeon: Ky Barban, MD;  Location: [facility];  Service: Urology;  Laterality: Right;   PORTACATH PLACEMENT Left 03/25/2022   Procedure: INSERTION PORT-A-CATH;  Surgeon: Franky Macho, MD;  Location: [facility];  Service: General;  Laterality: Left;   PROSTATE SURGERY     RADICAL NECK DISSECTION Bilateral 10/14/2021   Procedure: NECK DISSECTION;  Surgeon: Christia Reading, MD;  Location: [facility] OR;  Service: ENT;  Laterality: Bilateral;   SCALP LACERATION REPAIR     [facility]-Dr Katrinka Blazing   SKIN FULL THICKNESS GRAFT Bilateral 10/14/2021   Procedure: PLATYSMA FLAP CLOSURE;  Surgeon: Christia Reading, MD;  Location:  [facility] OR;  Service: ENT;  Laterality: Bilateral;   TOOTH EXTRACTION  10/14/2021   Procedure: DENTAL EXTRACTIONS;  Surgeon: Christia Reading, MD;  Location: [facility] OR;  Service: ENT;;   TRACHEOSTOMY TUBE PLACEMENT N/A 10/14/2021   Procedure: TRACHEOSTOMY;  Surgeon: Christia Reading, MD;  Location: [facility] OR;  Service: ENT;  Laterality: N/A;    Social History: Social History   Socioeconomic History   Marital status: Single    Spouse name: Not on file   Number of children: Not on file   Years of education: Not on file   Highest education level: Not on file  Occupational History   Not on file  Tobacco Use   Smoking status: Former    Packs/day: 0.25    Years: 50.00  Additional pack years: 0.00    Total pack years: 12.50    Types: Cigarettes   Smokeless tobacco: Never  Vaping Use   Vaping Use: Never used  Substance and Sexual Activity   Alcohol use: Not Currently   Drug use: No   Sexual activity: Yes    Birth control/protection: None  Other Topics Concern   Not on file  Social History Narrative   Not on file   Social Determinants of Health   Financial Resource Strain: Not on file  Food Insecurity: Not on file  Transportation Needs: Not on file  Physical Activity: Not on file  Stress: Not on file  Social Connections: Not on file  Intimate Partner Violence: Not on file    Family History: Family History  Problem Relation Age of Onset   Cancer Brother    Cancer Brother     Current Medications:  Current Outpatient Medications:    acetaminophen (TYLENOL) 650 MG CR tablet, Take 650 mg by mouth every 8 (eight) hours as needed for pain., Disp: , Rfl:    amLODipine (NORVASC) 10 MG tablet, Take 1 tablet (10 mg total) by mouth daily., Disp: 300 tablet, Rfl: 0   amLODipine-benazepril (LOTREL) 10-40 MG capsule, Take 1 capsule by mouth daily., Disp: , Rfl:    budesonide-formoterol (SYMBICORT) 80-4.5 MCG/ACT inhaler, Inhale 2 puffs into the lungs in the morning and at bedtime., Disp: 1 each,  Rfl: 12   carvedilol (COREG) 6.25 MG tablet, Take 1 tablet (6.25 mg total) by mouth 2 (two) times daily with a meal., Disp: 60 tablet, Rfl: 0   Cholecalciferol (VITAMIN D3) 50 MCG (2000 UT) TABS, Take 2,000 Units by mouth daily., Disp: , Rfl:    hydrALAZINE (APRESOLINE) 25 MG tablet, Take 25 mg by mouth 2 (two) times daily., Disp: , Rfl:    magnesium oxide (MAG-OX) 400 MG tablet, Take 400 mg by mouth daily., Disp: , Rfl:    Menthol, Topical Analgesic, (BIOFREEZE EX), Apply 1 application  topically daily as needed (pain)., Disp: , Rfl:    naloxone (NARCAN) nasal spray 4 mg/0.1 mL, SMARTSIG:Both Nares, Disp: , Rfl:    oxyCODONE (OXY IR/ROXICODONE) 5 MG immediate release tablet, Take 5 mg by mouth 3 (three) times daily as needed for moderate pain., Disp: , Rfl:    PEMBROLIZUMAB IV, Inject into the vein every 21 ( twenty-one) days., Disp: , Rfl:    Polyethyl Glycol-Propyl Glycol (GOODSENSE LUBRICANT EYE DROPS OP), Place 1 drop into both eyes daily as needed (dry eyes)., Disp: , Rfl:    pravastatin (PRAVACHOL) 80 MG tablet, Take 80 mg by mouth daily., Disp: , Rfl:    RAPAFLO 8 MG CAPS capsule, Take 8 mg by mouth daily. Silodosin, Disp: , Rfl:    vitamin B-12 (CYANOCOBALAMIN) 500 MCG tablet, Take 500 mcg by mouth daily., Disp: , Rfl:    HYDROcodone-acetaminophen (HYCET) 7.5-325 mg/15 ml solution, Take 15 mLs by mouth every 6 (six) hours as needed for moderate pain., Disp: 473 mL, Rfl: 0 No current facility-administered medications for this visit.  Facility-Administered Medications Ordered in Other Visits:    sodium chloride flush (NS) 0.9 % injection 10 mL, 10 mL, Intracatheter, PRN, Nathaniel Massed, MD, 10 mL at 06/02/22 1458   Allergies: No Known Allergies  REVIEW OF SYSTEMS:   Review of Systems  Constitutional:  Negative for chills, fatigue and fever.  HENT:   Negative for lump/mass, mouth sores, nosebleeds, sore throat and trouble swallowing.   Eyes:  Negative for eye  problems.   Respiratory:  Negative for cough and shortness of breath.   Cardiovascular:  Negative for chest pain, leg swelling and palpitations.  Gastrointestinal:  Negative for abdominal pain, constipation, diarrhea, nausea and vomiting.  Genitourinary:  Negative for bladder incontinence, difficulty urinating, dysuria, frequency, hematuria and nocturia.   Musculoskeletal:  Negative for arthralgias, back pain, flank pain, myalgias and neck pain.  Skin:  Negative for itching and rash.  Neurological:  Negative for dizziness, headaches and numbness.  Hematological:  Does not bruise/bleed easily.  Psychiatric/Behavioral:  Negative for depression, sleep disturbance and suicidal ideas. The patient is not nervous/anxious.   All other systems reviewed and are negative.    VITALS:   There were no vitals taken for this visit.  Wt Readings from Last 3 Encounters:  06/02/22 169 lb (76.7 kg)  05/29/22 174 lb 2.6 oz (79 kg)  05/12/22 174 lb 9.6 oz (79.2 kg)    There is no height or weight on file to calculate BMI.  Performance status (ECOG): 1 - Symptomatic but completely ambulatory  PHYSICAL EXAM:   Physical Exam Vitals and nursing note reviewed. Exam conducted with a chaperone present.  Constitutional:      Appearance: Normal appearance.  Cardiovascular:     Rate and Rhythm: Normal rate and regular rhythm.     Pulses: Normal pulses.     Heart sounds: Normal heart sounds.  Pulmonary:     Effort: Pulmonary effort is normal.     Breath sounds: Normal breath sounds.  Abdominal:     Palpations: Abdomen is soft. There is no hepatomegaly, splenomegaly or mass.     Tenderness: There is no abdominal tenderness.  Musculoskeletal:     Right lower leg: No edema.     Left lower leg: No edema.  Lymphadenopathy:     Cervical: No cervical adenopathy.     Right cervical: No superficial, deep or posterior cervical adenopathy.    Left cervical: No superficial, deep or posterior cervical adenopathy.     Upper  Body:     Right upper body: No supraclavicular or axillary adenopathy.     Left upper body: No supraclavicular or axillary adenopathy.  Neurological:     General: No focal deficit present.     Mental Status: He is alert and oriented to person, place, and time.  Psychiatric:        Mood and Affect: Mood normal.        Behavior: Behavior normal.     LABS:      Latest Ref Rng & Units 06/02/2022   11:21 AM 05/29/2022   12:15 AM 05/12/2022   12:02 PM  CBC  WBC 4.0 - 10.5 K/uL 9.2  9.0  6.7   Hemoglobin 13.0 - 17.0 g/dL 40.9  81.1  91.4   Hematocrit 39.0 - 52.0 % 34.3  37.6  34.5   Platelets 150 - 400 K/uL 357  319  278       Latest Ref Rng & Units 06/02/2022   11:19 AM 05/29/2022   12:15 AM 05/12/2022   12:02 PM  CMP  Glucose 70 - 99 mg/dL 782  956  213   BUN 8 - 23 mg/dL 17  18  15    Creatinine 0.61 - 1.24 mg/dL 0.86  5.78  4.69   Sodium 135 - 145 mmol/L 136  137  136   Potassium 3.5 - 5.1 mmol/L 3.9  4.1  3.7   Chloride 98 - 111 mmol/L 102  103  104   CO2 22 - 32 mmol/L 28  25  25    Calcium 8.9 - 10.3 mg/dL 16.1  09.6  9.8   Total Protein 6.5 - 8.1 g/dL 7.5  8.6  7.5   Total Bilirubin 0.3 - 1.2 mg/dL 0.5  0.8  0.6   Alkaline Phos 38 - 126 U/L 61  70  60   AST 15 - 41 U/L 18  16  14    ALT 0 - 44 U/L 17  13  11       Lab Results  Component Value Date   CEA 2.2 01/19/2013   /  CEA  Date Value Ref Range Status  01/19/2013 2.2 0.0 - 5.0 ng/mL Final    Comment:    Performed at [facility]   No results found for: "PSA1" No results found for: "EAV409" No results found for: "CAN125"  No results found for: "TOTALPROTELP", "ALBUMINELP", "A1GS", "A2GS", "BETS", "BETA2SER", "GAMS", "MSPIKE", "SPEI" Lab Results  Component Value Date   TIBC 251 05/12/2022   FERRITIN 195 05/12/2022   IRONPCTSAT 18 05/12/2022   Lab Results  Component Value Date   LDH 172 11/12/2020   LDH 175 10/26/2018   LDH 179 10/25/2013     STUDIES:   NM PET Image Restag (PS) Skull Base  To Thigh  Result Date: 06/01/2022 CLINICAL DATA:  Subsequent treatment strategy for floor of mouth cancer, last chemotherapy 3 weeks ago. EXAM: NUCLEAR MEDICINE PET SKULL BASE TO THIGH TECHNIQUE: 9.0 mCi F-18 FDG was injected intravenously. Full-ring PET imaging was performed from the skull base to thigh after the radiotracer. CT data was obtained and used for attenuation correction and anatomic localization. Fasting blood glucose: 110 mg/dl COMPARISON:  MR abdomen 01/02/2022, PET 12/12/2021. FINDINGS: Mediastinal blood pool activity: SUV max 2.8 Liver activity: SUV max NA NECK: A mass along the ventral floor of mouth measures approximately 2.8 x 2.9 cm (3/70), SUV max 16.4. Submental lymph nodes measure up to 7 mm (3/83), SUV max 2.7. Right level II lymph node measures 6 mm (3/81) SUV max 3.0. Right level III lymph node measures 7 mm (3/96), SUV max 4.3. Incidental CT findings: None. CHEST: No abnormal hypermetabolism. Incidental CT findings: Left IJ Port-A-Cath tip is in the azygous vein. Atherosclerotic calcification of the aorta, aortic valve and coronary arteries. Heart is enlarged. No pericardial or pleural effusion. Paraseptal emphysema. ABDOMEN/PELVIS: Hypermetabolic mass in the dome of the liver measures 1.7 x 2.6 cm, SUV max 4.1, stable from 12/11/2021. No abnormal hypermetabolism. Incidental CT findings: Liver, gallbladder, adrenal glands and right kidney are grossly unremarkable. Low-attenuation lesion in the left kidney. No specific follow-up necessary. Spleen, pancreas, stomach and bowel are grossly unremarkable with the exception of a small hiatal hernia. Enlarged prostate. SKELETON: No abnormal hypermetabolism. Incidental CT findings: Degenerative changes in the spine and hips. IMPRESSION: 1. 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 2. Enlarged prostate. 3. Aortic atherosclerosis (ICD10-I70.0). Coronary artery calcification. 4.  Emphysema  (ICD10-J43.9). Electronically Signed   By: Leanna Battles M.D.   On: 06/01/2022 15:20   DG Chest Portable 1 View  Result Date: 05/29/2022 CLINICAL DATA:  Shortness of breath, wheezing 3 EXAM: PORTABLE CHEST 1 VIEW COMPARISON:  03/25/2022 FINDINGS: Left Port-A-Cath remains in place, unchanged. Heart is borderline in size. Bibasilar atelectasis. No effusions. No acute bony abnormality. IMPRESSION: Bibasilar atelectasis. Electronically Signed   By: Charlett Nose M.D.   On: 05/29/2022 00:39

## 2022-06-05 ENCOUNTER — Other Ambulatory Visit: Payer: Self-pay

## 2022-06-06 ENCOUNTER — Other Ambulatory Visit: Payer: Self-pay

## 2022-06-06 DIAGNOSIS — Z930 Tracheostomy status: Secondary | ICD-10-CM | POA: Diagnosis not present

## 2022-06-08 DIAGNOSIS — Z930 Tracheostomy status: Secondary | ICD-10-CM | POA: Diagnosis not present

## 2022-06-12 DIAGNOSIS — C049 Malignant neoplasm of floor of mouth, unspecified: Secondary | ICD-10-CM | POA: Diagnosis not present

## 2022-06-16 DIAGNOSIS — C040 Malignant neoplasm of anterior floor of mouth: Secondary | ICD-10-CM | POA: Diagnosis not present

## 2022-06-16 DIAGNOSIS — C770 Secondary and unspecified malignant neoplasm of lymph nodes of head, face and neck: Secondary | ICD-10-CM | POA: Diagnosis not present

## 2022-06-16 DIAGNOSIS — C048 Malignant neoplasm of overlapping sites of floor of mouth: Secondary | ICD-10-CM | POA: Diagnosis not present

## 2022-06-16 DIAGNOSIS — Z510 Encounter for antineoplastic radiation therapy: Secondary | ICD-10-CM | POA: Diagnosis not present

## 2022-06-16 DIAGNOSIS — Z923 Personal history of irradiation: Secondary | ICD-10-CM | POA: Diagnosis not present

## 2022-06-16 DIAGNOSIS — C049 Malignant neoplasm of floor of mouth, unspecified: Secondary | ICD-10-CM | POA: Diagnosis not present

## 2022-06-21 NOTE — Progress Notes (Signed)
[facility] [address] [place] [address]    Clinic Day:  06/21/2022  Referring physician: [name], MD  Patient Care Team: Nathaniel Stabile, MD as PCP - General (Internal Medicine) Nathaniel Askew, MD as Consulting Physician (Urology) Nathaniel Massed, MD as Medical Oncologist (Medical Oncology) Nathaniel Sarah, RN as Oncology Nurse Navigator (Medical Oncology)   ASSESSMENT & PLAN:   Assessment: 1.  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  Enlarged contralateral right submandibular node.  Enlargement of the left submandibular gland probably reactive.  No abscess. - Biopsy (07/18/2021) floor of the mouth: Invasive well to moderately differentiated keratinizing squamous cell carcinoma.  Tumor cells negative for p16. - 10/14/2021: Floor of the mouth resection, tracheostomy, bilateral selective neck dissections zones 1-3 by Dr. Jenne Hicks - Pathology: Invasive moderately differentiated keratinizing SCC, 4.1 cm, carcinoma invades for a depth of about 1.9 cm and involves saliva gland tissue.  Anterior, posterior, right, left resection margins are involved.  Deep resection margin is negative.  Metastatic carcinoma 1/3 level 1 lymph nodes.  3 level 2 and 3 level 3 lymph nodes negative for carcinoma.  0/10 lymph nodes involved in the left side neck zone 1, 2, 3 dissection.  ENE not identified.  LVI/perineural invasion not identified. - I have talked to pathologist.  Reexcision margins were considered negative.  He has a high risk feature which is T4 tumor. -  PET scan on 12/12/2021: Soft tissue thickening and calcification along the ventral aspect of the trachea with associated hypermetabolism corresponding to recent tracheostomy site.  7 mm right  paratracheal lymph node new with SUV 2.4.  Hypermetabolism along the dome of the right hepatic lobe with probable 2.4 cm low-attenuation lesion - Liver lesion biopsy (01/28/2022): Metastatic squamous cell carcinoma, keratinizing. - NGS testing: PD-L1 (22 C3): CPS: 100, CD274 (PD-L1) amplified, JAK2 amplified, PIK3CA pathogenic variant exon 10, T p53 pathogenic variant, MS-stable, TMB-low - Per Dr. Jenne Hicks, he also has local recurrence of disease in the floor of the mouth. - Cycle 1 of Keytruda started on 03/10/2022.   2.  Social/family history: - He lives in his apartment by himself and is independent of ADLs and IADLs.  He does not drive.  He worked as a Curator and several other jobs.  Quit smoking 1 month ago.  He smoked 1 pack/week for more than 50 years. - 1 brother had agent orange related cancer.  Another brother also had cancer, type unknown to the patient.   3.  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 - He was on close surveillance rather than adjuvant chemotherapy.    Plan: 1.  Stage IV (PT4PN1 M1) SCC of the floor of the mouth, p16 negative: - He has completed 4 cycles of pembrolizumab.  No immunotherapy related side effects. - PET scan (05/28/2022): Hypermetabolic mass in the dome of the liver stable from 12/11/2021.  Mass along the ventral floor of the mouth measures 2.8 x 2.9 cm with SUV 16.  Submental lymph node measures 7 mm.  Right level two 6 mm node and level three 7 mm nodes. - I have recommended radiation therapy for the local recurrence. - Recommend continuing immunotherapy.  Labs today: Normal LFTs and creatinine.  Last TSH  2.2.  Continue Keytruda every 3 weeks.   2.  Oral pain: - This is from tumor recurrence in the lower jaw. - Continue hydrocodone liquid twice daily as needed. - He is having lower teeth extracted on Friday. - Given the local recurrence, recommend radiation therapy.  He is agreeable for treatment.   3.  Macrocytic  anemia: - Hemoglobin improved to 11.2 today.  Closely monitor.    No orders of the defined types were placed in this encounter.     I,Nathaniel Hicks,acting as a Neurosurgeon for Nathaniel Massed, MD.,have documented all relevant documentation on the behalf of Nathaniel Massed, MD,as directed by  Nathaniel Massed, MD while in the presence of Nathaniel Massed, MD.   ***  Nathaniel Hicks   6/9/20249:52 PM  CHIEF COMPLAINT:   Diagnosis: squamous cell carcinoma of the floor of the mouth and testicular seminoma   Cancer Staging  Cancer of floor of mouth [facility]) Staging form: Oral Cavity, AJCC 8th Edition - Clinical stage from 11/25/2021: Stage IVC (cT4a, cN1, pM1) - Signed by Nathaniel Massed, MD on 02/04/2022  Testicle cancer, right Staging form: Testis, AJCC 7th Edition - Clinical: Stage I (T2, N0, M0) - Signed by Nathaniel Newer, PA-C on 07/23/2013    Prior Therapy: none  Current Therapy:  Pembrolizumab    HISTORY OF PRESENT ILLNESS:   Oncology History  Testicle cancer, right  12/20/2012 Surgery   Right total orchiectomyt- 1. Testis, biopsy, right - SEMINOMA. PLEASE SEE COMMENT. 2. Testis, tumor, right - SEMINOMA, 7.5 CM. - ANGIOLYMPHATIC INVASION PRESENT. - RESECTION MARGINS, NEGATIVE FOR ATYPIA OR MALIGNANCY.   01/18/2013 PET scan   Status post right orchiectomy. No findings specific for metastatic disease. Small para-aortic nodes measuring up to 5 mm short axis, without convincing hypermetabolism. Given location, attention on follow-up is suggested.   04/17/2013 Imaging   CT CAP- No evidence of metastatic disease in the chest, abdomen or pelvis. Proximal LAD coronary artery calcification.   07/20/2013 Imaging   CT abd/pelvis- No evidence of metastatic disease in the abdomen or pelvis.   10/24/2013 Imaging   CT abd/pelvis- No findings to suggest metastatic disease in the abdomen or pelvis   10/24/2013 Imaging   Chest xray- Probable COPD.  Negative for  metastatic disease.   02/21/2014 Imaging   CT abd/pelvis- Stable abdominal pelvic CT status post right orchectomy. No evidence of adenopathy or other metastatic disease.   02/21/2014 Imaging   Chest xray- Left lower lobe mild atelectasis and/or infiltrate.     09/10/2014 Imaging   CT abd/pelvis- Status post right orchiectomy.   No evidence of metastatic disease.   3 mm nonobstructing right upper pole renal calculus. No hydronephrosis.   03/14/2015 Imaging   CT abd/pelvis- Stable exam. No evidence of metastatic disease or other acute findings within the abdomen or pelvis.   09/13/2015 Imaging   CT abd/pelvis- No acute findings and no evidence for mass or adenopathy.    04/24/2016 Imaging   CT abd/pelvis: IMPRESSION: 1. Stable exam. No new or progressive findings. No features to suggest metastatic disease.   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HCC)  10/14/2021 Initial Diagnosis   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HCC)   11/25/2021 Cancer Staging   Staging form: Oral Cavity, AJCC 8th Edition - Clinical stage from 11/25/2021: Stage IVC (cT4a, cN1, pM1) - Signed by Nathaniel Massed, MD on 02/04/2022 Histopathologic type: Squamous cell carcinoma, NOS Stage prefix: Initial diagnosis Histologic grade (G): G2 Histologic grading system: 3 grade system  03/10/2022 -  Chemotherapy   Patient is on Treatment Plan : HEAD/NECK Pembrolizumab (200) q21d        INTERVAL HISTORY:   Nathaniel Hicks is a 82 y.o. male presenting to clinic today for follow up of squamous cell carcinoma of the floor of the mouth and testicular seminoma. He was last seen by me on 06/02/22.  Since his last visit, he was seen by Dr. Langston Masker in rad onc on 06/12/22 and underwent CT sim on 06/16/22.  Today, he states that he is doing well overall. His appetite level is at ***%. His energy level is at ***%.  PAST MEDICAL HISTORY:   Past Medical History: Past Medical History:  Diagnosis Date   Arthritis    BPH (benign prostatic hyperplasia)     Difficult intubation    HOH (hard of hearing)    Hyperlipidemia 11/02/2017   Hypertension    Hypertension 11/02/2017   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HCC)    2014   Vitamin D deficiency 11/02/2017    Surgical History: Past Surgical History:  Procedure Laterality Date   COLONOSCOPY N/A 11/24/2012   Procedure: COLONOSCOPY;  Surgeon: Malissa Hippo, MD;  Location: [facility];  Service: Endoscopy;  Laterality: N/A;  830-moved to 730 Ann notified pt   FLOOR OF MOUTH BIOPSY N/A 10/14/2021   Procedure: FLOOR OF MOUTH RESECTION;  Surgeon: Christia Reading, MD;  Location: [facility] OR;  Service: ENT;  Laterality: N/A;   HEMORROIDECTOMY     KNEE ARTHROSCOPY WITH LATERAL MENISECTOMY Right 08/31/2017   Procedure: KNEE ARTHROSCOPY WITH LATERAL MENISECTOMY;  Surgeon: Vickki Hearing, MD;  Location: [facility];  Service: Orthopedics;  Laterality: Right;   LESION EXCISION N/A 03/23/2012   Procedure: EXCISION NEOPLASM SCALP ;  Surgeon: Dalia Heading, MD;  Location: [facility];  Service: General;  Laterality: N/A;  Excision of Scalp Neoplasm   ORCHIECTOMY Right 12/20/2012   Procedure: RIGHT RADICAL ORCHIECTOMY/POSSIBLE BX RIGHT TESTICLE;  Surgeon: Ky Barban, MD;  Location: [facility];  Service: Urology;  Laterality: Right;   PORTACATH PLACEMENT Left 03/25/2022   Procedure: INSERTION PORT-A-CATH;  Surgeon: Franky Macho, MD;  Location: [facility];  Service: General;  Laterality: Left;   PROSTATE SURGERY     RADICAL NECK DISSECTION Bilateral 10/14/2021   Procedure: NECK DISSECTION;  Surgeon: Christia Reading, MD;  Location: [facility] OR;  Service: ENT;  Laterality: Bilateral;   SCALP LACERATION REPAIR     [facility]-Dr Katrinka Blazing   SKIN FULL THICKNESS GRAFT Bilateral 10/14/2021   Procedure: PLATYSMA FLAP CLOSURE;  Surgeon: Christia Reading, MD;  Location: [facility] OR;  Service: ENT;  Laterality: Bilateral;   TOOTH EXTRACTION  10/14/2021   Procedure: DENTAL EXTRACTIONS;  Surgeon: Christia Reading, MD;  Location: [facility] OR;  Service: ENT;;   TRACHEOSTOMY TUBE PLACEMENT  N/A 10/14/2021   Procedure: TRACHEOSTOMY;  Surgeon: Christia Reading, MD;  Location: [facility] OR;  Service: ENT;  Laterality: N/A;    Social History: Social History   Socioeconomic History   Marital status: Single    Spouse name: Not on file   Number of children: Not on file   Years of education: Not on file   Highest education level: Not on file  Occupational History   Not on file  Tobacco Use   Smoking status: Former    Packs/day: 0.25    Years: 50.00    Additional pack years: 0.00    Total pack years: 12.50    Types: Cigarettes   Smokeless tobacco: Never  Vaping Use  Vaping Use: Never used  Substance and Sexual Activity   Alcohol use: Not Currently   Drug use: No   Sexual activity: Yes    Birth control/protection: None  Other Topics Concern   Not on file  Social History Narrative   Not on file   Social Determinants of Health   Financial Resource Strain: Not on file  Food Insecurity: Not on file  Transportation Needs: Not on file  Physical Activity: Not on file  Stress: Not on file  Social Connections: Not on file  Intimate Partner Violence: Not on file    Family History: Family History  Problem Relation Age of Onset   Cancer Brother    Cancer Brother     Current Medications:  Current Outpatient Medications:    acetaminophen (TYLENOL) 650 MG CR tablet, Take 650 mg by mouth every 8 (eight) hours as needed for pain., Disp: , Rfl:    amLODipine (NORVASC) 10 MG tablet, Take 1 tablet (10 mg total) by mouth daily., Disp: 300 tablet, Rfl: 0   amLODipine-benazepril (LOTREL) 10-40 MG capsule, Take 1 capsule by mouth daily., Disp: , Rfl:    budesonide-formoterol (SYMBICORT) 80-4.5 MCG/ACT inhaler, Inhale 2 puffs into the lungs in the morning and at bedtime., Disp: 1 each, Rfl: 12   carvedilol (COREG) 6.25 MG tablet, Take 1 tablet (6.25 mg total) by mouth 2 (two) times daily with a meal., Disp: 60 tablet, Rfl: 0   Cholecalciferol (VITAMIN D3) 50 MCG (2000 UT) TABS, Take  2,000 Units by mouth daily., Disp: , Rfl:    hydrALAZINE (APRESOLINE) 25 MG tablet, Take 25 mg by mouth 2 (two) times daily., Disp: , Rfl:    HYDROcodone-acetaminophen (HYCET) 7.5-325 mg/15 ml solution, Take 15 mLs by mouth every 6 (six) hours as needed for moderate pain., Disp: 473 mL, Rfl: 0   magnesium oxide (MAG-OX) 400 MG tablet, Take 400 mg by mouth daily., Disp: , Rfl:    Menthol, Topical Analgesic, (BIOFREEZE EX), Apply 1 application  topically daily as needed (pain)., Disp: , Rfl:    naloxone (NARCAN) nasal spray 4 mg/0.1 mL, SMARTSIG:Both Nares, Disp: , Rfl:    oxyCODONE (OXY IR/ROXICODONE) 5 MG immediate release tablet, Take 5 mg by mouth 3 (three) times daily as needed for moderate pain., Disp: , Rfl:    PEMBROLIZUMAB IV, Inject into the vein every 21 ( twenty-one) days., Disp: , Rfl:    Polyethyl Glycol-Propyl Glycol (GOODSENSE LUBRICANT EYE DROPS OP), Place 1 drop into both eyes daily as needed (dry eyes)., Disp: , Rfl:    pravastatin (PRAVACHOL) 80 MG tablet, Take 80 mg by mouth daily., Disp: , Rfl:    RAPAFLO 8 MG CAPS capsule, Take 8 mg by mouth daily. Silodosin, Disp: , Rfl:    vitamin B-12 (CYANOCOBALAMIN) 500 MCG tablet, Take 500 mcg by mouth daily., Disp: , Rfl:    Allergies: No Known Allergies  REVIEW OF SYSTEMS:   Review of Systems  Constitutional:  Negative for chills, fatigue and fever.  HENT:   Negative for lump/mass, mouth sores, nosebleeds, sore throat and trouble swallowing.   Eyes:  Negative for eye problems.  Respiratory:  Negative for cough and shortness of breath.   Cardiovascular:  Negative for chest pain, leg swelling and palpitations.  Gastrointestinal:  Negative for abdominal pain, constipation, diarrhea, nausea and vomiting.  Genitourinary:  Negative for bladder incontinence, difficulty urinating, dysuria, frequency, hematuria and nocturia.   Musculoskeletal:  Negative for arthralgias, back pain, flank pain, myalgias and neck pain.  Skin:  Negative for  itching and rash.  Neurological:  Negative for dizziness, headaches and numbness.  Hematological:  Does not bruise/bleed easily.  Psychiatric/Behavioral:  Negative for depression, sleep disturbance and suicidal ideas. The patient is not nervous/anxious.   All other systems reviewed and are negative.    VITALS:   There were no vitals taken for this visit.  Wt Readings from Last 3 Encounters:  06/02/22 169 lb (76.7 kg)  05/29/22 174 lb 2.6 oz (79 kg)  05/12/22 174 lb 9.6 oz (79.2 kg)    There is no height or weight on file to calculate BMI.  Performance status (ECOG): {CHL ONC [id number]  PHYSICAL EXAM:   Physical Exam Vitals and nursing note reviewed. Exam conducted with a chaperone present.  Constitutional:      Appearance: Normal appearance.  Cardiovascular:     Rate and Rhythm: Normal rate and regular rhythm.     Pulses: Normal pulses.     Heart sounds: Normal heart sounds.  Pulmonary:     Effort: Pulmonary effort is normal.     Breath sounds: Normal breath sounds.  Abdominal:     Palpations: Abdomen is soft. There is no hepatomegaly, splenomegaly or mass.     Tenderness: There is no abdominal tenderness.  Musculoskeletal:     Right lower leg: No edema.     Left lower leg: No edema.  Lymphadenopathy:     Cervical: No cervical adenopathy.     Right cervical: No superficial, deep or posterior cervical adenopathy.    Left cervical: No superficial, deep or posterior cervical adenopathy.     Upper Body:     Right upper body: No supraclavicular or axillary adenopathy.     Left upper body: No supraclavicular or axillary adenopathy.  Neurological:     General: No focal deficit present.     Mental Status: He is alert and oriented to person, place, and time.  Psychiatric:        Mood and Affect: Mood normal.        Behavior: Behavior normal.     LABS:      Latest Ref Rng & Units 06/02/2022   11:21 AM 05/29/2022   12:15 AM 05/12/2022   12:02 PM  CBC  WBC 4.0 -  10.5 K/uL 9.2  9.0  6.7   Hemoglobin 13.0 - 17.0 g/dL 16.1  09.6  04.5   Hematocrit 39.0 - 52.0 % 34.3  37.6  34.5   Platelets 150 - 400 K/uL 357  319  278       Latest Ref Rng & Units 06/02/2022   11:19 AM 05/29/2022   12:15 AM 05/12/2022   12:02 PM  CMP  Glucose 70 - 99 mg/dL 409  811  914   BUN 8 - 23 mg/dL 17  18  15    Creatinine 0.61 - 1.24 mg/dL 7.82  9.56  2.13   Sodium 135 - 145 mmol/L 136  137  136   Potassium 3.5 - 5.1 mmol/L 3.9  4.1  3.7   Chloride 98 - 111 mmol/L 102  103  104   CO2 22 - 32 mmol/L 28  25  25    Calcium 8.9 - 10.3 mg/dL 08.6  57.8  9.8   Total Protein 6.5 - 8.1 g/dL 7.5  8.6  7.5   Total Bilirubin 0.3 - 1.2 mg/dL 0.5  0.8  0.6   Alkaline Phos 38 - 126 U/L 61  70  60   AST 15 -  41 U/L 18  16  14    ALT 0 - 44 U/L 17  13  11       Lab Results  Component Value Date   CEA 2.2 01/19/2013   /  CEA  Date Value Ref Range Status  01/19/2013 2.2 0.0 - 5.0 ng/mL Final    Comment:    Performed at [facility]   No results found for: "PSA1" No results found for: "CAN199" No results found for: "CAN125"  No results found for: "TOTALPROTELP", "ALBUMINELP", "A1GS", "A2GS", "BETS", "BETA2SER", "GAMS", "MSPIKE", "SPEI" Lab Results  Component Value Date   TIBC 251 05/12/2022   FERRITIN 195 05/12/2022   IRONPCTSAT 18 05/12/2022   Lab Results  Component Value Date   LDH 172 11/12/2020   LDH 175 10/26/2018   LDH 179 10/25/2013     STUDIES:   NM PET Image Restag (PS) Skull Base To Thigh  Result Date: 06/01/2022 CLINICAL DATA:  Subsequent treatment strategy for floor of mouth cancer, last chemotherapy 3 weeks ago. EXAM: NUCLEAR MEDICINE PET SKULL BASE TO THIGH TECHNIQUE: 9.0 mCi F-18 FDG was injected intravenously. Full-ring PET imaging was performed from the skull base to thigh after the radiotracer. CT data was obtained and used for attenuation correction and anatomic localization. Fasting blood glucose: 110 mg/dl COMPARISON:  MR abdomen 01/02/2022,  PET 12/12/2021. FINDINGS: Mediastinal blood pool activity: SUV max 2.8 Liver activity: SUV max NA NECK: A mass along the ventral floor of mouth measures approximately 2.8 x 2.9 cm (3/70), SUV max 16.4. Submental lymph nodes measure up to 7 mm (3/83), SUV max 2.7. Right level II lymph node measures 6 mm (3/81) SUV max 3.0. Right level III lymph node measures 7 mm (3/96), SUV max 4.3. Incidental CT findings: None. CHEST: No abnormal hypermetabolism. Incidental CT findings: Left IJ Port-A-Cath tip is in the azygous vein. Atherosclerotic calcification of the aorta, aortic valve and coronary arteries. Heart is enlarged. No pericardial or pleural effusion. Paraseptal emphysema. ABDOMEN/PELVIS: Hypermetabolic mass in the dome of the liver measures 1.7 x 2.6 cm, SUV max 4.1, stable from 12/11/2021. No abnormal hypermetabolism. Incidental CT findings: Liver, gallbladder, adrenal glands and right kidney are grossly unremarkable. Low-attenuation lesion in the left kidney. No specific follow-up necessary. Spleen, pancreas, stomach and bowel are grossly unremarkable with the exception of a small hiatal hernia. Enlarged prostate. SKELETON: No abnormal hypermetabolism. Incidental CT findings: Degenerative changes in the spine and hips. IMPRESSION: 1. 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 2. Enlarged prostate. 3. Aortic atherosclerosis (ICD10-I70.0). Coronary artery calcification. 4.  Emphysema (ICD10-J43.9). Electronically Signed   By: Leanna Battles M.D.   On: 06/01/2022 15:20   DG Chest Portable 1 View  Result Date: 05/29/2022 CLINICAL DATA:  Shortness of breath, wheezing 3 EXAM: PORTABLE CHEST 1 VIEW COMPARISON:  03/25/2022 FINDINGS: Left Port-A-Cath remains in place, unchanged. Heart is borderline in size. Bibasilar atelectasis. No effusions. No acute bony abnormality. IMPRESSION: Bibasilar atelectasis. Electronically Signed   By: Charlett Nose M.D.   On: 05/29/2022 00:39

## 2022-06-22 ENCOUNTER — Inpatient Hospital Stay: Payer: 59

## 2022-06-22 ENCOUNTER — Inpatient Hospital Stay (HOSPITAL_BASED_OUTPATIENT_CLINIC_OR_DEPARTMENT_OTHER): Payer: 59 | Admitting: Hematology

## 2022-06-22 ENCOUNTER — Inpatient Hospital Stay: Payer: 59 | Attending: Hematology

## 2022-06-22 ENCOUNTER — Other Ambulatory Visit: Payer: Self-pay | Admitting: *Deleted

## 2022-06-22 VITALS — BP 105/52 | HR 88 | Temp 96.1°F | Resp 18 | Wt 163.2 lb

## 2022-06-22 VITALS — BP 109/57 | HR 81 | Resp 18

## 2022-06-22 DIAGNOSIS — D539 Nutritional anemia, unspecified: Secondary | ICD-10-CM | POA: Insufficient documentation

## 2022-06-22 DIAGNOSIS — D508 Other iron deficiency anemias: Secondary | ICD-10-CM | POA: Diagnosis not present

## 2022-06-22 DIAGNOSIS — K1379 Other lesions of oral mucosa: Secondary | ICD-10-CM | POA: Insufficient documentation

## 2022-06-22 DIAGNOSIS — C049 Malignant neoplasm of floor of mouth, unspecified: Secondary | ICD-10-CM | POA: Diagnosis not present

## 2022-06-22 DIAGNOSIS — I10 Essential (primary) hypertension: Secondary | ICD-10-CM | POA: Diagnosis not present

## 2022-06-22 DIAGNOSIS — Z9079 Acquired absence of other genital organ(s): Secondary | ICD-10-CM | POA: Insufficient documentation

## 2022-06-22 DIAGNOSIS — Z5112 Encounter for antineoplastic immunotherapy: Secondary | ICD-10-CM | POA: Diagnosis not present

## 2022-06-22 DIAGNOSIS — Z87891 Personal history of nicotine dependence: Secondary | ICD-10-CM | POA: Diagnosis not present

## 2022-06-22 DIAGNOSIS — C787 Secondary malignant neoplasm of liver and intrahepatic bile duct: Secondary | ICD-10-CM | POA: Diagnosis not present

## 2022-06-22 DIAGNOSIS — Z95828 Presence of other vascular implants and grafts: Secondary | ICD-10-CM

## 2022-06-22 DIAGNOSIS — Z8547 Personal history of malignant neoplasm of testis: Secondary | ICD-10-CM | POA: Insufficient documentation

## 2022-06-22 LAB — CBC WITH DIFFERENTIAL/PLATELET
Abs Immature Granulocytes: 0.02 10*3/uL (ref 0.00–0.07)
Basophils Absolute: 0.1 10*3/uL (ref 0.0–0.1)
Basophils Relative: 1 %
Eosinophils Absolute: 0.3 10*3/uL (ref 0.0–0.5)
Eosinophils Relative: 3 %
HCT: 33.9 % — ABNORMAL LOW (ref 39.0–52.0)
Hemoglobin: 10.7 g/dL — ABNORMAL LOW (ref 13.0–17.0)
Immature Granulocytes: 0 %
Lymphocytes Relative: 22 %
Lymphs Abs: 1.8 10*3/uL (ref 0.7–4.0)
MCH: 31.6 pg (ref 26.0–34.0)
MCHC: 31.6 g/dL (ref 30.0–36.0)
MCV: 100 fL (ref 80.0–100.0)
Monocytes Absolute: 1.1 10*3/uL — ABNORMAL HIGH (ref 0.1–1.0)
Monocytes Relative: 13 %
Neutro Abs: 5 10*3/uL (ref 1.7–7.7)
Neutrophils Relative %: 61 %
Platelets: 316 10*3/uL (ref 150–400)
RBC: 3.39 MIL/uL — ABNORMAL LOW (ref 4.22–5.81)
RDW: 13.6 % (ref 11.5–15.5)
WBC: 8.2 10*3/uL (ref 4.0–10.5)
nRBC: 0 % (ref 0.0–0.2)

## 2022-06-22 LAB — COMPREHENSIVE METABOLIC PANEL
ALT: 11 U/L (ref 0–44)
AST: 13 U/L — ABNORMAL LOW (ref 15–41)
Albumin: 3.4 g/dL — ABNORMAL LOW (ref 3.5–5.0)
Alkaline Phosphatase: 57 U/L (ref 38–126)
Anion gap: 8 (ref 5–15)
BUN: 15 mg/dL (ref 8–23)
CO2: 26 mmol/L (ref 22–32)
Calcium: 9.9 mg/dL (ref 8.9–10.3)
Chloride: 100 mmol/L (ref 98–111)
Creatinine, Ser: 0.96 mg/dL (ref 0.61–1.24)
GFR, Estimated: >60 mL/min (ref >60)
Glucose, Bld: 111 mg/dL — ABNORMAL HIGH (ref 70–99)
Potassium: 4.1 mmol/L (ref 3.5–5.1)
Sodium: 134 mmol/L — ABNORMAL LOW (ref 135–145)
Total Bilirubin: 0.7 mg/dL (ref 0.3–1.2)
Total Protein: 7.4 g/dL (ref 6.5–8.1)

## 2022-06-22 LAB — MAGNESIUM: Magnesium: 2.1 mg/dL (ref 1.7–2.4)

## 2022-06-22 MED ORDER — SODIUM CHLORIDE 0.9 % IV SOLN: Freq: Once | INTRAVENOUS | Status: AC

## 2022-06-22 MED ORDER — SODIUM CHLORIDE 0.9 % IV SOLN
200.0000 mg | Freq: Once | INTRAVENOUS | Status: AC
  Administered 2022-06-22: 200 mg via INTRAVENOUS
  Filled 2022-06-22: qty 8

## 2022-06-22 MED ORDER — HYDROCODONE-ACETAMINOPHEN 7.5-325 MG/15ML PO SOLN: 15.0000 mL | Freq: Four times a day (QID) | ORAL | 0 refills | Status: DC | PRN

## 2022-06-22 MED ORDER — HEPARIN SOD (PORK) LOCK FLUSH 100 UNIT/ML IV SOLN
500.0000 [IU] | Freq: Once | INTRAVENOUS | Status: AC | PRN
  Administered 2022-06-22: 500 [IU]

## 2022-06-22 MED ORDER — SODIUM CHLORIDE 0.9% FLUSH
10.0000 mL | Freq: Once | INTRAVENOUS | Status: AC
  Administered 2022-06-22: 10 mL via INTRAVENOUS

## 2022-06-22 MED ORDER — SODIUM CHLORIDE 0.9% FLUSH
10.0000 mL | INTRAVENOUS | Status: DC | PRN
  Administered 2022-06-22: 10 mL

## 2022-06-22 NOTE — Patient Instructions (Signed)
[facility] [place]  Discharge Instructions: Thank you for choosing [facility] to provide your oncology and hematology care.  If you have a lab appointment with the Cancer Center - please note that after April 8th, 2024, all labs will be drawn in the cancer center.  You do not have to check in or register with the main entrance as you have in the past but will complete your check-in in the cancer center.  Wear comfortable clothing and clothing appropriate for easy access to any Portacath or PICC line.   We strive to give you quality time with your provider. You may need to reschedule your appointment if you arrive late (15 or more minutes).  Arriving late affects you and other patients whose appointments are after yours.  Also, if you miss three or more appointments without notifying the office, you may be dismissed from the clinic at the provider's discretion.      For prescription refill requests, have your pharmacy contact our office and allow 72 hours for refills to be completed.    Today you received the following chemotherapy and/or immunotherapy agents Keytruda   To help prevent nausea and vomiting after your treatment, we encourage you to take your nausea medication as directed.  Pembrolizumab Injection What is this medication? PEMBROLIZUMAB (PEM broe LIZ ue mab) treats some types of cancer. It works by helping your immune system slow or stop the spread of cancer cells. It is a monoclonal antibody. This medicine may be used for other purposes; ask your health care provider or pharmacist if you have questions. COMMON BRAND NAME(S): Keytruda What should I tell my care team before I take this medication? They need to know if you have any of these conditions: Allogeneic stem cell transplant (uses someone else's stem cells) Autoimmune diseases, such as Crohn disease, ulcerative colitis, lupus History of chest radiation Nervous system problems, such as  Guillain-Barre syndrome, myasthenia gravis Organ transplant An unusual or allergic reaction to pembrolizumab, other medications, foods, dyes, or preservatives Pregnant or trying to get pregnant Breast-feeding How should I use this medication? This medication is injected into a vein. It is given by your care team in a hospital or clinic setting. A special MedGuide will be given to you before each treatment. Be sure to read this information carefully each time. Talk to your care team about the use of this medication in children. While it may be prescribed for children as young as 6 months for selected conditions, precautions do apply. Overdosage: If you think you have taken too much of this medicine contact a poison control center or emergency room at once. NOTE: This medicine is only for you. Do not share this medicine with others. What if I miss a dose? Keep appointments for follow-up doses. It is important not to miss your dose. Call your care team if you are unable to keep an appointment. What may interact with this medication? Interactions have not been studied. This list may not describe all possible interactions. Give your health care provider a list of all the medicines, herbs, non-prescription drugs, or dietary supplements you use. Also tell them if you smoke, drink alcohol, or use illegal drugs. Some items may interact with your medicine. What should I watch for while using this medication? Your condition will be monitored carefully while you are receiving this medication. You may need blood work while taking this medication. This medication may cause serious skin reactions. They can happen weeks to months   after starting the medication. Contact your care team right away if you notice fevers or flu-like symptoms with a rash. The rash may be red or purple and then turn into blisters or peeling of the skin. You may also notice a red rash with swelling of the face, lips, or lymph nodes in your  neck or under your arms. Tell your care team right away if you have any change in your eyesight. Talk to your care team if you may be pregnant. Serious birth defects can occur if you take this medication during pregnancy and for 4 months after the last dose. You will need a negative pregnancy test before starting this medication. Contraception is recommended while taking this medication and for 4 months after the last dose. Your care team can help you find the option that works for you. Do not breastfeed while taking this medication and for 4 months after the last dose. What side effects may I notice from receiving this medication? Side effects that you should report to your care team as soon as possible: Allergic reactions--skin rash, itching, hives, swelling of the face, lips, tongue, or throat Dry cough, shortness of breath or trouble breathing Eye pain, redness, irritation, or discharge with blurry or decreased vision Heart muscle inflammation--unusual weakness or fatigue, shortness of breath, chest pain, fast or irregular heartbeat, dizziness, swelling of the ankles, feet, or hands Hormone gland problems--headache, sensitivity to light, unusual weakness or fatigue, dizziness, fast or irregular heartbeat, increased sensitivity to cold or heat, excessive sweating, constipation, hair loss, increased thirst or amount of urine, tremors or shaking, irritability Infusion reactions--chest pain, shortness of breath or trouble breathing, feeling faint or lightheaded Kidney injury (glomerulonephritis)--decrease in the amount of urine, red or dark brown urine, foamy or bubbly urine, swelling of the ankles, hands, or feet Liver injury--right upper belly pain, loss of appetite, nausea, light-colored stool, dark yellow or brown urine, yellowing skin or eyes, unusual weakness or fatigue Pain, tingling, or numbness in the hands or feet, muscle weakness, change in vision, confusion or trouble speaking, loss of  balance or coordination, trouble walking, seizures Rash, fever, and swollen lymph nodes Redness, blistering, peeling, or loosening of the skin, including inside the mouth Sudden or severe stomach pain, bloody diarrhea, fever, nausea, vomiting Side effects that usually do not require medical attention (report to your care team if they continue or are bothersome): Bone, joint, or muscle pain Diarrhea Fatigue Loss of appetite Nausea Skin rash This list may not describe all possible side effects. Call your doctor for medical advice about side effects. You may report side effects to FDA at 1-800-FDA-1088. Where should I keep my medication? This medication is given in a hospital or clinic. It will not be stored at home. NOTE: This sheet is a summary. It may not cover all possible information. If you have questions about this medicine, talk to your doctor, pharmacist, or health care provider.  2024 Elsevier/Gold Standard (2021-05-13 00:00:00)   BELOW ARE SYMPTOMS THAT SHOULD BE REPORTED IMMEDIATELY: *FEVER GREATER THAN 100.4 F (38 C) OR HIGHER *CHILLS OR SWEATING *NAUSEA AND VOMITING THAT IS NOT CONTROLLED WITH YOUR NAUSEA MEDICATION *UNUSUAL SHORTNESS OF BREATH *UNUSUAL BRUISING OR BLEEDING *URINARY PROBLEMS (pain or burning when urinating, or frequent urination) *BOWEL PROBLEMS (unusual diarrhea, constipation, pain near the anus) TENDERNESS IN MOUTH AND THROAT WITH OR WITHOUT PRESENCE OF ULCERS (sore throat, sores in mouth, or a toothache) UNUSUAL RASH, SWELLING OR PAIN  UNUSUAL VAGINAL DISCHARGE OR ITCHING   Items   with * indicate a potential emergency and should be followed up as soon as possible or go to the Emergency Department if any problems should occur.  Please show the CHEMOTHERAPY ALERT CARD or IMMUNOTHERAPY ALERT CARD at check-in to the Emergency Department and triage nurse.  Should you have questions after your visit or need to cancel or reschedule your appointment, please  contact [facility] [phone]  and follow the prompts.  Office hours are 8:00 a.m. to 4:30 p.m. Monday - Friday. Please note that voicemails left after 4:00 p.m. may not be returned until the following business day.  We are closed weekends and major holidays. You have access to a nurse at all times for urgent questions. Please call the main number to the clinic [phone] and follow the prompts.  For any non-urgent questions, you may also contact your provider using MyChart. We now offer e-Visits for anyone 18 and older to request care online for non-urgent symptoms. For details visit [email or web address].   Also download the MyChart app! Go to the app store, search "MyChart", open the app, select [facility], and log in with your MyChart username and password.   

## 2022-06-22 NOTE — Progress Notes (Signed)
Patient presents today for Keytruda infusion. Patient is in satisfactory condition with no new complaints voiced.  Vital signs are stable.  Labs reviewed by Dr. Katragadda during the office visit and all labs are within treatment parameters.  We will proceed with treatment per MD orders.   Treatment given today per MD orders. Tolerated infusion without adverse affects. Vital signs stable. No complaints at this time. Discharged from clinic ambulatory in stable condition. Alert and oriented x 3. F/U with [facility] as scheduled.   

## 2022-06-22 NOTE — Progress Notes (Signed)
Patient has been examined by Dr. Katragadda. Vital signs and labs have been reviewed by MD - ANC, Creatinine, LFTs, hemoglobin, and platelets are within treatment parameters per M.D. - pt may proceed with treatment.  Primary RN and pharmacy notified.  

## 2022-06-22 NOTE — Patient Instructions (Signed)
[facility] Discharge Instructions   You were seen and examined today by Dr. Katragadda.  He reviewed the results of your lab work which are normal/stable.   We will proceed with your treatment today.  Return as scheduled.    Thank you for choosing [facility] to provide your oncology and hematology care.  To afford each patient quality time with our provider, please arrive at least 15 minutes before your scheduled appointment time.   If you have a lab appointment with the Cancer Center please come in thru the Main Entrance and check in at the main information desk.  You need to re-schedule your appointment should you arrive 10 or more minutes late.  We strive to give you quality time with our providers, and arriving late affects you and other patients whose appointments are after yours.  Also, if you no show three or more times for appointments you may be dismissed from the clinic at the providers discretion.     Again, thank you for choosing [facility].  Our hope is that these requests will decrease the amount of time that you wait before being seen by our physicians.       _____________________________________________________________  Should you have questions after your visit to  Cancer Center, please contact our office at [phone] and follow the prompts.  Our office hours are 8:00 a.m. and 4:30 p.m. Monday - Friday.  Please note that voicemails left after 4:00 p.m. may not be returned until the following business day.  We are closed weekends and major holidays.  You do have access to a nurse 24-7, just call the main number to the clinic [phone] and do not press any options, hold on the line and a nurse will answer the phone.    For prescription refill requests, have your pharmacy contact our office and allow 72 hours.    Due to Covid, you will need to wear a mask upon entering  the hospital. If you do not have a mask, a mask will be given to you at the Main Entrance upon arrival. For doctor visits, patients may have 1 support person age 18 or older with them. For treatment visits, patients can not have anyone with them due to social distancing guidelines and our immunocompromised population.      

## 2022-06-25 DIAGNOSIS — C770 Secondary and unspecified malignant neoplasm of lymph nodes of head, face and neck: Secondary | ICD-10-CM | POA: Diagnosis not present

## 2022-06-25 DIAGNOSIS — Z923 Personal history of irradiation: Secondary | ICD-10-CM | POA: Diagnosis not present

## 2022-06-25 DIAGNOSIS — C040 Malignant neoplasm of anterior floor of mouth: Secondary | ICD-10-CM | POA: Diagnosis not present

## 2022-06-25 DIAGNOSIS — C048 Malignant neoplasm of overlapping sites of floor of mouth: Secondary | ICD-10-CM | POA: Diagnosis not present

## 2022-06-25 DIAGNOSIS — C049 Malignant neoplasm of floor of mouth, unspecified: Secondary | ICD-10-CM | POA: Diagnosis not present

## 2022-06-25 DIAGNOSIS — Z510 Encounter for antineoplastic radiation therapy: Secondary | ICD-10-CM | POA: Diagnosis not present

## 2022-06-29 DIAGNOSIS — C770 Secondary and unspecified malignant neoplasm of lymph nodes of head, face and neck: Secondary | ICD-10-CM | POA: Diagnosis not present

## 2022-06-29 DIAGNOSIS — C049 Malignant neoplasm of floor of mouth, unspecified: Secondary | ICD-10-CM | POA: Diagnosis not present

## 2022-06-29 DIAGNOSIS — Z510 Encounter for antineoplastic radiation therapy: Secondary | ICD-10-CM | POA: Diagnosis not present

## 2022-06-29 DIAGNOSIS — C040 Malignant neoplasm of anterior floor of mouth: Secondary | ICD-10-CM | POA: Diagnosis not present

## 2022-06-30 DIAGNOSIS — Z510 Encounter for antineoplastic radiation therapy: Secondary | ICD-10-CM | POA: Diagnosis not present

## 2022-06-30 DIAGNOSIS — C040 Malignant neoplasm of anterior floor of mouth: Secondary | ICD-10-CM | POA: Diagnosis not present

## 2022-06-30 DIAGNOSIS — C049 Malignant neoplasm of floor of mouth, unspecified: Secondary | ICD-10-CM | POA: Diagnosis not present

## 2022-06-30 DIAGNOSIS — C770 Secondary and unspecified malignant neoplasm of lymph nodes of head, face and neck: Secondary | ICD-10-CM | POA: Diagnosis not present

## 2022-07-05 NOTE — Progress Notes (Unsigned)
Nathaniel Hicks, male    DOB: [birth year]    MRN: [id number]   Brief patient profile:  81  yobm  quit smoking Oct  2023 p admission to [facility]  referred to pulmonary clinic in [place]  07/07/2022 by Dr  Glendora Score for sob with wheezing .    Admit date: 10/14/2021 Discharge date: 10/28/2021   Admission Diagnoses: Oral cancer   Discharge Diagnoses:  Principal Problem:   Oral cancer (HCC) Active Problems:   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HCC)   Malnutrition of moderate degree     Discharged Condition: good   Hospital Course: 82 year old male presented to the hospital for surgical management of floor of mouth cancer.  See operative note.  He was observed in the ICU after surgery with a new tracheostomy and drains and an NG feeding tube in place.  He had difficulty with nausea and vomiting the first couple of days that prevented starting nutrition and required replacement of the NG tube.  The hospitalist service was asked to consult.  He had a low grade fever a couple of days after surgery presumed to be related to aspiration and was started on Unasyn.  The fever and vomiting improved.  The flap under his tongue remained intact through his hospitalization but became dusky.  There was good blood flow with needle exploration.  He was transferred to a regular hospital room receiving tube feeds.  His tracheostomy tube was changed to a cuffless tube.  Drains were removed.  He completed 7 days of Unasyn.  He worked with Psychologist, sport and exercise, PT, and OT.  Ultimately, his feeding tube was removed and his diet was advanced and evaluated by speech pathology.  At the time of discharge, he is tolerating an oral diet.  His tracheostomy remains in place and he is tolerating the Passy-Muir valve.  The flap under the tongue is evolving with some necrosis of the skin that may later require debridement.  He is being discharged to inpatient rehab for further recovery.   Consults:  Hospitalist, SLP, PT, OT    Significant Diagnostic Studies: None   Treatments: surgery: Floor of mouth resection, tracheostomy, bilateral neck dissections, and platysma flap     History of Present Illness  07/07/2022  Pulmonary/ 1st office eval/ Maynor Mwangi / [facility] on ACEi  Chief Complaint  Patient presents with   Pulmonary Consult    Referred by Dr. Glendora Score.    Dyspnea:  more limited by fatigue/weakness from RT than sob  Cough: cough / wheezing at night Sleep: bed is flat/ 2 pillows does fine despite "wheezing" SABA use: just symbicort 80 not sure it's doing anything  02: none  Tol pureed diet ok   No obvious day to day or daytime pattern/variability or assoc excess/ purulent sputum or mucus plugs or hemoptysis or cp or chest tightness,  or overt sinus or hb symptoms.     Also denies any obvious fluctuation of symptoms with weather or environmental changes or other aggravating or alleviating factors except as outlined above   No unusual exposure hx or h/o childhood pna/ asthma or knowledge of premature birth.  Current Allergies, Complete Past Medical History, Past Surgical History, Family History, and Social History were reviewed in [employer] record.  ROS  The following are not active complaints unless bolded Hoarseness, sore throat, dysphagia, dental problems, itching, sneezing,  nasal congestion or discharge of excess mucus or purulent secretions, ear ache,   fever,  chills, sweats, unintended wt loss or wt gain, classically pleuritic or exertional cp,  orthopnea pnd or arm/hand swelling  or leg swelling, presyncope, palpitations, abdominal pain, anorexia, nausea, vomiting, diarrhea  or change in bowel habits or change in bladder habits, change in stools or change in urine, dysuria, hematuria,  rash, arthralgias, visual complaints, headache, numbness, weakness or ataxia or problems with walking or coordination,  change in mood or  memory.           Outpatient Medications  Prior to Visit -  - NOTE:   Unable to verify as accurately reflecting what pt takes  - note two forms of amlodipine listed  Medication Sig Dispense Refill   acetaminophen (TYLENOL) 650 MG CR tablet Take 650 mg by mouth every 8 (eight) hours as needed for pain.     amLODipine (NORVASC) 10 MG tablet Take 1 tablet (10 mg total) by mouth daily. 300 tablet 0   amLODipine-benazepril (LOTREL) 10-40 MG capsule Take 1 capsule by mouth daily.     budesonide-formoterol (SYMBICORT) 80-4.5 MCG/ACT inhaler Inhale 2 puffs into the lungs in the morning and at bedtime. 1 each 12   carvedilol (COREG) 6.25 MG tablet Take 1 tablet (6.25 mg total) by mouth 2 (two) times daily with a meal. 60 tablet 0   Cholecalciferol (VITAMIN D3) 50 MCG (2000 UT) TABS Take 2,000 Units by mouth daily.     hydrALAZINE (APRESOLINE) 25 MG tablet Take 25 mg by mouth 2 (two) times daily.     HYDROcodone-acetaminophen (HYCET) 7.5-325 mg/15 ml solution Take 15 mLs by mouth every 6 (six) hours as needed for moderate pain. 473 mL 0   magnesium oxide (MAG-OX) 400 MG tablet Take 400 mg by mouth daily.     Menthol, Topical Analgesic, (BIOFREEZE EX) Apply 1 application  topically daily as needed (pain).     naloxone (NARCAN) nasal spray 4 mg/0.1 mL SMARTSIG:Both Nares     oxyCODONE (OXY IR/ROXICODONE) 5 MG immediate release tablet Take 5 mg by mouth 3 (three) times daily as needed for moderate pain.     PEMBROLIZUMAB IV Inject into the vein every 21 ( twenty-one) days.     Polyethyl Glycol-Propyl Glycol (GOODSENSE LUBRICANT EYE DROPS OP) Place 1 drop into both eyes daily as needed (dry eyes).     pravastatin (PRAVACHOL) 80 MG tablet Take 80 mg by mouth daily.     RAPAFLO 8 MG CAPS capsule Take 8 mg by mouth daily. Silodosin     vitamin B-12 (CYANOCOBALAMIN) 500 MCG tablet Take 500 mcg by mouth daily.     No facility-administered medications prior to visit.    Past Medical History:  Diagnosis Date   Arthritis    BPH (benign prostatic  hyperplasia)    Difficult intubation    HOH (hard of hearing)    Hyperlipidemia 11/02/2017   Hypertension    Hypertension 11/02/2017   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HCC)    2014   Vitamin D deficiency 11/02/2017      Objective:     BP (!) 112/58 (BP Location: Left Arm, Cuff Size: Normal)   Pulse (!) 102   Ht 5\' 11"  (1.803 m)   Wt 157 lb (71.2 kg)   SpO2 97% Comment: on RA  BMI 21.90 kg/m   SpO2: 97 % (on RA)  Amb chronically ill appearing bm very difficult to understand speech     HEENT : Oropharynx  clear/ mass floor of mouth make tongue protrude up and out slightly  NECK :  without  apparent JVD/ palpable Nodes/TM    LUNGS: no acc muscle use,  Nl contour chest which is clear to A and P bilaterally without cough on insp or exp maneuvers   CV:  RRR  no s3 or murmur or increase in P2, and no edema   ABD:  soft and nontender with nl inspiratory excursion in the supine position. No bruits or organomegaly appreciated   MS:  slow slightly unsteady gait/ ext warm without deformities Or obvious joint restrictions  calf tenderness, cyanosis or clubbing    SKIN: warm and dry without lesions    NEURO:  alert, approp, nl sensorium with  no motor or cerebellar deficits apparent.    I personally reviewed images and agree with radiology impression as follows: PET CT  06/07/22 1. 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 2. Enlarged prostate. 3. Aortic atherosclerosis (ICD10-I70.0). Coronary artery calcification. 4.  Emphysema (ICD10-J43.9).    Assessment   Essential hypertension Try off acei due to hoarseness/ cough   ACE inhibitors are problematic in  pts with airway complaints because  even experienced pulmonologists can't always distinguish ace effects from copd/asthma.  By themselves they don't actually cause a problem, much like oxygen can't by itself start a fire, but they certainly serve as a powerful catalyst or enhancer  for any "fire"  or inflammatory process in the upper airway, be it caused by an ET  tube or more commonly reflux (especially in the obese or pts with known GERD or who are on biphoshonates).    In the era of ARB near equivalency until we have a better handle on the reversibility of this  airway problem, it just makes sense to avoid ACEI  entirely in the short run and then decide later, having established a level of airway control using a reasonable limited regimen, whether to add back ace but even then being very careful to observe the pt for worsening airway control and number of meds used/ needed to control symptoms.     He is listed as taking hydralazine and amlopdine 10  and lotrel so I substituted amlodpine10 /olmesartan 40 but if proves to have refractory hypertension would consider simplifying by starting minoxidil 2.5 mg bid and titrating up but defer this to Dr Scharlene Gloss office as he has f/u on 07/10/22   DOE (dyspnea on exertion) Quit smoking 10/2021 p prolonged admit/ rehab for sq cell ca floor of mouth> transient trach - CT  06/07/22 pos emphysema  - 07/07/2022   Walked on RA  x  2  lap(s) =  approx 300  ft  @ slow/mod pace, stopped due to legs got tire/ no sob with lowest 02 sats 95%    At this point he has more fatigue than sob with mostly upper airway complaints that may be related to  ACEi use so will address that first then have him return to regroup in 4 weeks - call sooner if   worse breathing.   Each maintenance medication was reviewed in detail including emphasizing most importantly the difference between maintenance and prns and under what circumstances the prns are to be triggered using an action plan format where appropriate.  Total time for H and P, chart review, counseling, reviewing hfa device(s) , directly observing portions of ambulatory 02 saturation study/ and generating customized AVS unique to this office visit / same day charting > 45 min new complex pt eval  Sandrea Hughs, MD 07/07/2022

## 2022-07-06 DIAGNOSIS — E782 Mixed hyperlipidemia: Secondary | ICD-10-CM | POA: Diagnosis not present

## 2022-07-06 DIAGNOSIS — E559 Vitamin D deficiency, unspecified: Secondary | ICD-10-CM | POA: Diagnosis not present

## 2022-07-06 DIAGNOSIS — R7301 Impaired fasting glucose: Secondary | ICD-10-CM | POA: Diagnosis not present

## 2022-07-07 ENCOUNTER — Encounter: Payer: Self-pay | Admitting: Internal Medicine

## 2022-07-07 ENCOUNTER — Ambulatory Visit (INDEPENDENT_AMBULATORY_CARE_PROVIDER_SITE_OTHER): Payer: 59 | Admitting: Internal Medicine

## 2022-07-07 VITALS — BP 112/58 | HR 102 | Ht 71.0 in | Wt 157.0 lb

## 2022-07-07 DIAGNOSIS — I10 Essential (primary) hypertension: Secondary | ICD-10-CM | POA: Diagnosis not present

## 2022-07-07 DIAGNOSIS — R0609 Other forms of dyspnea: Secondary | ICD-10-CM | POA: Diagnosis not present

## 2022-07-07 DIAGNOSIS — Z930 Tracheostomy status: Secondary | ICD-10-CM | POA: Diagnosis not present

## 2022-07-07 MED ORDER — AMLODIPINE-OLMESARTAN 10-40 MG PO TABS: 1.0000 | ORAL_TABLET | Freq: Every day | ORAL | 11 refills | Status: DC

## 2022-07-07 NOTE — Patient Instructions (Signed)
Stop amlodipine-benazepril  and start amlopine 10-olmesartan 40 mg one daily in its place  Stop amlodipine if you have it in a separate bottle and keep it separate.  Then take all the bottles with you to Dr Scharlene Gloss office   Please schedule a follow up office visit in 4 weeks, sooner if needed  with all medications /inhalers/ solutions in hand so we can verify exactly what you are taking. This includes all medications from all doctors and over the counters

## 2022-07-08 ENCOUNTER — Other Ambulatory Visit: Payer: Self-pay

## 2022-07-08 ENCOUNTER — Encounter: Payer: Self-pay | Admitting: Internal Medicine

## 2022-07-08 DIAGNOSIS — R0609 Other forms of dyspnea: Secondary | ICD-10-CM | POA: Insufficient documentation

## 2022-07-08 NOTE — Assessment & Plan Note (Addendum)
Quit smoking 10/2021 p prolonged admit/ rehab for sq cell ca floor of mouth> transient trach - CT  06/07/22 pos emphysema  - 07/07/2022   Walked on RA  x  2  lap(s) =  approx 300  ft  @ slow/mod pace, stopped due to legs got tire/ no sob with lowest 02 sats 95%    At this point he has more fatigue than sob with mostly upper airway complaints that may be related to  ACEi use so will address that first then have him return to regroup in 4 weeks - call sooner if   worse breathing.   Each maintenance medication was reviewed in detail including emphasizing most importantly the difference between maintenance and prns and under what circumstances the prns are to be triggered using an action plan format where appropriate.  Total time for H and P, chart review, counseling, reviewing hfa device(s) , directly observing portions of ambulatory 02 saturation study/ and generating customized AVS unique to this office visit / same day charting > 45 min new complex pt eval

## 2022-07-08 NOTE — Assessment & Plan Note (Addendum)
Try off acei due to hoarseness/ cough   ACE inhibitors are problematic in  pts with airway complaints because  even experienced pulmonologists can't always distinguish ace effects from copd/asthma.  By themselves they don't actually cause a problem, much like oxygen can't by itself start a fire, but they certainly serve as a powerful catalyst or enhancer for any "fire"  or inflammatory process in the upper airway, be it caused by an ET  tube or more commonly reflux (especially in the obese or pts with known GERD or who are on biphoshonates).    In the era of ARB near equivalency until we have a better handle on the reversibility of this  airway problem, it just makes sense to avoid ACEI  entirely in the short run and then decide later, having established a level of airway control using a reasonable limited regimen, whether to add back ace but even then being very careful to observe the pt for worsening airway control and number of meds used/ needed to control symptoms.     He is listed as taking hydralazine and amlopdine 10  and lotrel so I substituted amlodpine10 /olmesartan 40 but if proves to have refractory hypertension would consider simplifying by starting minoxidil 2.5 mg bid and titrating up but defer this to Dr Scharlene Gloss office as he has f/u on 07/10/22

## 2022-07-09 DIAGNOSIS — Z930 Tracheostomy status: Secondary | ICD-10-CM | POA: Diagnosis not present

## 2022-07-10 DIAGNOSIS — E782 Mixed hyperlipidemia: Secondary | ICD-10-CM | POA: Diagnosis not present

## 2022-07-10 DIAGNOSIS — M1990 Unspecified osteoarthritis, unspecified site: Secondary | ICD-10-CM | POA: Diagnosis not present

## 2022-07-10 DIAGNOSIS — E559 Vitamin D deficiency, unspecified: Secondary | ICD-10-CM | POA: Diagnosis not present

## 2022-07-10 DIAGNOSIS — I10 Essential (primary) hypertension: Secondary | ICD-10-CM | POA: Diagnosis not present

## 2022-07-10 DIAGNOSIS — R7301 Impaired fasting glucose: Secondary | ICD-10-CM | POA: Diagnosis not present

## 2022-07-10 DIAGNOSIS — D170 Benign lipomatous neoplasm of skin and subcutaneous tissue of head, face and neck: Secondary | ICD-10-CM | POA: Diagnosis not present

## 2022-07-10 DIAGNOSIS — M545 Low back pain, unspecified: Secondary | ICD-10-CM | POA: Diagnosis not present

## 2022-07-10 DIAGNOSIS — R252 Cramp and spasm: Secondary | ICD-10-CM | POA: Diagnosis not present

## 2022-07-10 DIAGNOSIS — G8929 Other chronic pain: Secondary | ICD-10-CM | POA: Diagnosis not present

## 2022-07-10 DIAGNOSIS — D539 Nutritional anemia, unspecified: Secondary | ICD-10-CM | POA: Diagnosis not present

## 2022-07-10 DIAGNOSIS — G629 Polyneuropathy, unspecified: Secondary | ICD-10-CM | POA: Diagnosis not present

## 2022-07-14 ENCOUNTER — Other Ambulatory Visit: Payer: Self-pay

## 2022-07-14 ENCOUNTER — Inpatient Hospital Stay: Payer: 59

## 2022-07-14 ENCOUNTER — Inpatient Hospital Stay: Payer: 59 | Attending: Hematology

## 2022-07-14 ENCOUNTER — Inpatient Hospital Stay: Payer: 59 | Admitting: Hematology

## 2022-07-14 VITALS — BP 115/48 | HR 97 | Temp 97.2°F | Resp 18

## 2022-07-14 DIAGNOSIS — C049 Malignant neoplasm of floor of mouth, unspecified: Secondary | ICD-10-CM

## 2022-07-14 DIAGNOSIS — D649 Anemia, unspecified: Secondary | ICD-10-CM | POA: Diagnosis not present

## 2022-07-14 DIAGNOSIS — Z923 Personal history of irradiation: Secondary | ICD-10-CM | POA: Diagnosis not present

## 2022-07-14 DIAGNOSIS — Z87891 Personal history of nicotine dependence: Secondary | ICD-10-CM | POA: Insufficient documentation

## 2022-07-14 DIAGNOSIS — I959 Hypotension, unspecified: Secondary | ICD-10-CM | POA: Diagnosis not present

## 2022-07-14 DIAGNOSIS — R062 Wheezing: Secondary | ICD-10-CM | POA: Insufficient documentation

## 2022-07-14 DIAGNOSIS — Z8547 Personal history of malignant neoplasm of testis: Secondary | ICD-10-CM | POA: Diagnosis not present

## 2022-07-14 DIAGNOSIS — C787 Secondary malignant neoplasm of liver and intrahepatic bile duct: Secondary | ICD-10-CM | POA: Diagnosis not present

## 2022-07-14 DIAGNOSIS — Z5112 Encounter for antineoplastic immunotherapy: Secondary | ICD-10-CM | POA: Diagnosis not present

## 2022-07-14 DIAGNOSIS — Z79620 Long term (current) use of immunosuppressive biologic: Secondary | ICD-10-CM | POA: Insufficient documentation

## 2022-07-14 DIAGNOSIS — Z809 Family history of malignant neoplasm, unspecified: Secondary | ICD-10-CM | POA: Diagnosis not present

## 2022-07-14 LAB — COMPREHENSIVE METABOLIC PANEL
ALT: 24 U/L (ref 0–44)
AST: 21 U/L (ref 15–41)
Albumin: 3.3 g/dL — ABNORMAL LOW (ref 3.5–5.0)
Alkaline Phosphatase: 48 U/L (ref 38–126)
Anion gap: 9 (ref 5–15)
BUN: 23 mg/dL (ref 8–23)
CO2: 26 mmol/L (ref 22–32)
Calcium: 10.3 mg/dL (ref 8.9–10.3)
Chloride: 101 mmol/L (ref 98–111)
Creatinine, Ser: 1.16 mg/dL (ref 0.61–1.24)
GFR, Estimated: >60 mL/min (ref >60)
Glucose, Bld: 114 mg/dL — ABNORMAL HIGH (ref 70–99)
Potassium: 4.6 mmol/L (ref 3.5–5.1)
Sodium: 136 mmol/L (ref 135–145)
Total Bilirubin: 0.7 mg/dL (ref 0.3–1.2)
Total Protein: 7.6 g/dL (ref 6.5–8.1)

## 2022-07-14 LAB — CBC WITH DIFFERENTIAL/PLATELET
Abs Immature Granulocytes: 0.03 10*3/uL (ref 0.00–0.07)
Basophils Absolute: 0.1 10*3/uL (ref 0.0–0.1)
Basophils Relative: 1 %
Eosinophils Absolute: 0.2 10*3/uL (ref 0.0–0.5)
Eosinophils Relative: 2 %
HCT: 31.9 % — ABNORMAL LOW (ref 39.0–52.0)
Hemoglobin: 10.1 g/dL — ABNORMAL LOW (ref 13.0–17.0)
Immature Granulocytes: 0 %
Lymphocytes Relative: 17 %
Lymphs Abs: 1.5 10*3/uL (ref 0.7–4.0)
MCH: 31.9 pg (ref 26.0–34.0)
MCHC: 31.7 g/dL (ref 30.0–36.0)
MCV: 100.6 fL — ABNORMAL HIGH (ref 80.0–100.0)
Monocytes Absolute: 0.9 10*3/uL (ref 0.1–1.0)
Monocytes Relative: 10 %
Neutro Abs: 6.3 10*3/uL (ref 1.7–7.7)
Neutrophils Relative %: 70 %
Platelets: 363 10*3/uL (ref 150–400)
RBC: 3.17 MIL/uL — ABNORMAL LOW (ref 4.22–5.81)
RDW: 13.2 % (ref 11.5–15.5)
WBC: 9 10*3/uL (ref 4.0–10.5)
nRBC: 0 % (ref 0.0–0.2)

## 2022-07-14 LAB — MAGNESIUM: Magnesium: 2 mg/dL (ref 1.7–2.4)

## 2022-07-14 MED ORDER — SODIUM CHLORIDE 0.9 % IV SOLN
200.0000 mg | Freq: Once | INTRAVENOUS | Status: AC
  Administered 2022-07-14: 200 mg via INTRAVENOUS
  Filled 2022-07-14: qty 8

## 2022-07-14 MED ORDER — HEPARIN SOD (PORK) LOCK FLUSH 100 UNIT/ML IV SOLN: 500.0000 [IU] | Freq: Once | INTRAVENOUS | Status: DC | PRN

## 2022-07-14 MED ORDER — SODIUM CHLORIDE 0.9% FLUSH: 10.0000 mL | INTRAVENOUS | Status: DC | PRN

## 2022-07-14 MED ORDER — SODIUM CHLORIDE 0.9 % IV SOLN: Freq: Once | INTRAVENOUS | Status: AC

## 2022-07-14 MED ORDER — HYDROCODONE-ACETAMINOPHEN 7.5-325 MG/15ML PO SOLN: 15.0000 mL | Freq: Four times a day (QID) | ORAL | 0 refills | Status: DC | PRN

## 2022-07-14 NOTE — Progress Notes (Signed)
Labs meet parameters for treatment.

## 2022-07-15 ENCOUNTER — Other Ambulatory Visit: Payer: Self-pay

## 2022-07-18 ENCOUNTER — Other Ambulatory Visit: Payer: Self-pay

## 2022-07-30 DIAGNOSIS — R0602 Shortness of breath: Secondary | ICD-10-CM | POA: Diagnosis not present

## 2022-07-30 DIAGNOSIS — R062 Wheezing: Secondary | ICD-10-CM | POA: Diagnosis not present

## 2022-08-03 NOTE — Progress Notes (Signed)
[facility] [address] [place] [address]    Clinic Day:  08/04/2022  Referring physician: [name], MD  Patient Care Team: [name], MD as PCP - General (Internal Medicine) [name], MD as Consulting Physician (Urology) [name], MD as Medical Oncologist (Medical Oncology) [name], RN as Oncology Nurse Navigator (Medical Oncology)   ASSESSMENT & PLAN:   Assessment: 1.  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  Enlarged contralateral right submandibular node.  Enlargement of the left submandibular gland probably reactive.  No abscess. - Biopsy (07/18/2021) floor of the mouth: Invasive well to moderately differentiated keratinizing squamous cell carcinoma.  Tumor cells negative for p16. - 10/14/2021: Floor of the mouth resection, tracheostomy, bilateral selective neck dissections zones 1-3 by Dr. [name] - Pathology: Invasive moderately differentiated keratinizing SCC, 4.1 cm, carcinoma invades for a depth of about 1.9 cm and involves saliva gland tissue.  Anterior, posterior, right, left resection margins are involved.  Deep resection margin is negative.  Metastatic carcinoma 1/3 level 1 lymph nodes.  3 level 2 and 3 level 3 lymph nodes negative for carcinoma.  0/10 lymph nodes involved in the left side neck zone 1, 2, 3 dissection.  ENE not identified.  LVI/perineural invasion not identified. - I have talked to pathologist.  Reexcision margins were considered negative.  He has a high risk feature which is T4 tumor. -  PET scan on 12/12/2021: Soft tissue thickening and calcification along the ventral aspect of the trachea with associated hypermetabolism corresponding to recent tracheostomy site.  7 mm right  paratracheal lymph node new with SUV 2.4.  Hypermetabolism along the dome of the right hepatic lobe with probable 2.4 cm low-attenuation lesion - Liver lesion biopsy (01/28/2022): Metastatic squamous cell carcinoma, keratinizing. - NGS testing: PD-L1 (22 C3): CPS: 100, CD274 (PD-L1) amplified, JAK2 amplified, PIK3CA pathogenic variant exon 10, T p53 pathogenic variant, MS-stable, TMB-low - Per Dr. [name], he also has local recurrence of disease in the floor of the mouth. - Cycle 1 of Keytruda started on 03/10/2022. - XRT to the floor of the mouth on 06/29/2022   2.  Social/family history: - He lives in his apartment by himself and is independent of ADLs and IADLs.  He does not drive.  He worked as a Curator and several other jobs.  Quit smoking 1 month ago.  He smoked 1 pack/week for more than 50 years. - 1 brother had agent orange related cancer.  Another brother also had cancer, type unknown to the patient.   3.  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 - He was on close surveillance rather than adjuvant chemotherapy.    Plan: 1.  Stage IV (PT4PN1 M1) SCC of the floor of the mouth, p16 negative: - He completed XRT to the floor of the mouth around 06/29/2022. - He reported improvement in pain.  However he reports wheezing for the last few weeks.  He is using Symbicort.  He will see Dr. Sharee Pimple on Friday. - Saturations are 99% on room air.  Bilateral wheezing on auscultation. - Will give him DuoNeb nebulizer today. - Reviewed labs today: Normal LFTs and creatinine.  CBC shows hemoglobin is 9.6. - Will proceed with treatment today.  RTC 3 weeks for follow-up.    2.  Oral pain: -Continue hydrocodone liquid twice daily as needed.  He completed XRT around 06/29/2022.   3.  Macrocytic anemia: -Hemoglobin today is 9.6.  Ferritin is 273 and percent saturation of 19.  Anemia from inflammation.  Will closely monitor.  4.  Hypotension: - Blood pressure today is  82/40.  He is not symptomatic.  Will give him 500 mL normal saline bolus. -Recommend holding amlodipine/olmesartan and hydralazine.  Also recommended checking blood pressure at home.    Orders Placed This Encounter  Procedures   TSH     I,Helena [name],acting as a scribe for [name], MD.,have documented all relevant documentation on the behalf of [name], MD,as directed by  [name], MD while in the presence of [name], MD.  I, [name] MD, have reviewed the above documentation for accuracy and completeness, and I agree with the above.    [name], MD   7/23/202412:56 PM  CHIEF COMPLAINT:   Diagnosis: squamous cell carcinoma of the floor of the mouth and testicular seminoma    Cancer Staging  Cancer of floor of mouth [facility]) Staging form: Oral Cavity, AJCC 8th Edition - Clinical stage from 11/25/2021: Stage IVC (cT4a, cN1, pM1) - Signed by [name], MD on 02/04/2022  Testicle cancer, right Staging form: Testis, AJCC 7th Edition - Clinical: Stage I (T2, N0, M0) - Signed by [name], PA-C on 07/23/2013    Prior Therapy: none  Current Therapy:  Pembrolizumab    HISTORY OF PRESENT ILLNESS:   Oncology History  Testicle cancer, right  12/20/2012 Surgery   Right total orchiectomyt- 1. Testis, biopsy, right - SEMINOMA. PLEASE SEE COMMENT. 2. Testis, tumor, right - SEMINOMA, 7.5 CM. - ANGIOLYMPHATIC INVASION PRESENT. - RESECTION MARGINS, NEGATIVE FOR ATYPIA OR MALIGNANCY.   01/18/2013 PET scan   Status post right orchiectomy. No findings specific for metastatic disease. Small para-aortic nodes measuring up to 5 mm short axis, without convincing hypermetabolism. Given location, attention on follow-up is suggested.   04/17/2013 Imaging   CT CAP- No evidence of metastatic disease in the chest, abdomen or pelvis. Proximal LAD coronary artery calcification.   07/20/2013 Imaging   CT abd/pelvis- No  evidence of metastatic disease in the abdomen or pelvis.   10/24/2013 Imaging   CT abd/pelvis- No findings to suggest metastatic disease in the abdomen or pelvis   10/24/2013 Imaging   Chest xray- Probable COPD.  Negative for metastatic disease.   02/21/2014 Imaging   CT abd/pelvis- Stable abdominal pelvic CT status post right orchectomy. No evidence of adenopathy or other metastatic disease.   02/21/2014 Imaging   Chest xray- Left lower lobe mild atelectasis and/or infiltrate.     09/10/2014 Imaging   CT abd/pelvis- Status post right orchiectomy.   No evidence of metastatic disease.   3 mm nonobstructing right upper pole renal calculus. No hydronephrosis.   03/14/2015 Imaging   CT abd/pelvis- Stable exam. No evidence of metastatic disease or other acute findings within the abdomen or pelvis.   09/13/2015 Imaging   CT abd/pelvis- No acute findings and no evidence for mass or adenopathy.    04/24/2016 Imaging   CT abd/pelvis: IMPRESSION: 1. Stable exam. No new or progressive findings. No features to suggest metastatic disease.   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HCC)  10/14/2021 Initial Diagnosis   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HCC)   11/25/2021 Cancer Staging   Staging form: Oral Cavity, AJCC 8th  Edition - Clinical stage from 11/25/2021: Stage IVC (cT4a, cN1, pM1) - Signed by [name], MD on 02/04/2022 Histopathologic type: Squamous cell carcinoma, NOS Stage prefix: Initial diagnosis Histologic grade (G): G2 Histologic grading system: 3 grade system   03/10/2022 -  Chemotherapy   Patient is on Treatment Plan : HEAD/NECK Pembrolizumab (200) q21d        INTERVAL HISTORY:   Julion is a 82 y.o. male presenting to clinic today for follow up of squamous cell carcinoma of the floor of the mouth and testicular seminoma. He was last seen by me on 06/22/22.  Today, he states that he is doing well overall. His appetite level is at 100%. His energy level is at 10%.  PAST MEDICAL HISTORY:    Past Medical History: Past Medical History:  Diagnosis Date   Arthritis    BPH (benign prostatic hyperplasia)    Difficult intubation    HOH (hard of hearing)    Hyperlipidemia 11/02/2017   Hypertension    Hypertension 11/02/2017   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HCC)    2014   Vitamin D deficiency 11/02/2017    Surgical History: Past Surgical History:  Procedure Laterality Date   COLONOSCOPY N/A 11/24/2012   Procedure: COLONOSCOPY;  Surgeon: Malissa Hippo, MD;  Location: [facility];  Service: Endoscopy;  Laterality: N/A;  830-moved to 730 Ann notified pt   FLOOR OF MOUTH BIOPSY N/A 10/14/2021   Procedure: FLOOR OF MOUTH RESECTION;  Surgeon: Christia Reading, MD;  Location: [facility] OR;  Service: ENT;  Laterality: N/A;   HEMORROIDECTOMY     KNEE ARTHROSCOPY WITH LATERAL MENISECTOMY Right 08/31/2017   Procedure: KNEE ARTHROSCOPY WITH LATERAL MENISECTOMY;  Surgeon: Vickki Hearing, MD;  Location: [facility] ORS;  Service: Orthopedics;  Laterality: Right;   LESION EXCISION N/A 03/23/2012   Procedure: EXCISION NEOPLASM SCALP ;  Surgeon: Dalia Heading, MD;  Location: [facility] ORS;  Service: General;  Laterality: N/A;  Excision of Scalp Neoplasm   ORCHIECTOMY Right 12/20/2012   Procedure: RIGHT RADICAL ORCHIECTOMY/POSSIBLE BX RIGHT TESTICLE;  Surgeon: Ky Barban, MD;  Location: [facility];  Service: Urology;  Laterality: Right;   PORTACATH PLACEMENT Left 03/25/2022   Procedure: INSERTION PORT-A-CATH;  Surgeon: Franky Macho, MD;  Location: [facility];  Service: General;  Laterality: Left;   PROSTATE SURGERY     RADICAL NECK DISSECTION Bilateral 10/14/2021   Procedure: NECK DISSECTION;  Surgeon: Christia Reading, MD;  Location: [facility] OR;  Service: ENT;  Laterality: Bilateral;   SCALP LACERATION REPAIR     [facility]-Dr Katrinka Blazing   SKIN FULL THICKNESS GRAFT Bilateral 10/14/2021   Procedure: PLATYSMA FLAP CLOSURE;  Surgeon: Christia Reading, MD;  Location: [facility] OR;  Service: ENT;  Laterality: Bilateral;   TOOTH EXTRACTION  10/14/2021    Procedure: DENTAL EXTRACTIONS;  Surgeon: Christia Reading, MD;  Location: [facility] OR;  Service: ENT;;   TRACHEOSTOMY TUBE PLACEMENT N/A 10/14/2021   Procedure: TRACHEOSTOMY;  Surgeon: Christia Reading, MD;  Location: [facility] OR;  Service: ENT;  Laterality: N/A;    Social History: Social History   Socioeconomic History   Marital status: Single    Spouse name: Not on file   Number of children: Not on file   Years of education: Not on file   Highest education level: Not on file  Occupational History   Not on file  Tobacco Use   Smoking status: Former    Current packs/day: 0.25    Average packs/day: 0.3 packs/day for 50.0 years (12.5 ttl pk-yrs)  Types: Cigarettes   Smokeless tobacco: Never  Vaping Use   Vaping status: Never Used  Substance and Sexual Activity   Alcohol use: Not Currently   Drug use: No   Sexual activity: Yes    Birth control/protection: None  Other Topics Concern   Not on file  Social History Narrative   Not on file   Social Determinants of Health   Financial Resource Strain: Not on file  Food Insecurity: Not on file  Transportation Needs: Not on file  Physical Activity: Not on file  Stress: Not on file  Social Connections: Not on file  Intimate Partner Violence: Not on file    Family History: Family History  Problem Relation Age of Onset   Cancer Brother    Cancer Brother     Current Medications:  Current Outpatient Medications:    acetaminophen (TYLENOL) 650 MG CR tablet, Take 650 mg by mouth every 8 (eight) hours as needed for pain., Disp: , Rfl:    amLODipine-olmesartan (AZOR) 10-40 MG tablet, Take 1 tablet by mouth daily., Disp: 30 tablet, Rfl: 11   budesonide-formoterol (SYMBICORT) 80-4.5 MCG/ACT inhaler, Inhale 2 puffs into the lungs in the morning and at bedtime., Disp: 1 each, Rfl: 12   carvedilol (COREG) 6.25 MG tablet, Take 1 tablet (6.25 mg total) by mouth 2 (two) times daily with a meal., Disp: 60 tablet, Rfl: 0   Cholecalciferol (VITAMIN D3) 50  MCG (2000 UT) TABS, Take 2,000 Units by mouth daily., Disp: , Rfl:    hydrALAZINE (APRESOLINE) 25 MG tablet, Take 25 mg by mouth 2 (two) times daily., Disp: , Rfl:    HYDROcodone-acetaminophen (HYCET) 7.5-325 mg/15 ml solution, Take 15 mLs by mouth every 6 (six) hours as needed for moderate pain., Disp: 473 mL, Rfl: 0   lidocaine (XYLOCAINE) 2 % solution, Take by mouth., Disp: , Rfl:    magnesium oxide (MAG-OX) 400 MG tablet, Take 400 mg by mouth daily., Disp: , Rfl:    Menthol, Topical Analgesic, (BIOFREEZE EX), Apply 1 application  topically daily as needed (pain)., Disp: , Rfl:    naloxone (NARCAN) nasal spray 4 mg/0.1 mL, SMARTSIG:Both Nares, Disp: , Rfl:    PEMBROLIZUMAB IV, Inject into the vein every 21 ( twenty-one) days., Disp: , Rfl:    Polyethyl Glycol-Propyl Glycol (GOODSENSE LUBRICANT EYE DROPS OP), Place 1 drop into both eyes daily as needed (dry eyes)., Disp: , Rfl:    pravastatin (PRAVACHOL) 80 MG tablet, Take 80 mg by mouth daily., Disp: , Rfl:    RAPAFLO 8 MG CAPS capsule, Take 8 mg by mouth daily. Silodosin, Disp: , Rfl:    vitamin B-12 (CYANOCOBALAMIN) 500 MCG tablet, Take 500 mcg by mouth daily., Disp: , Rfl:  No current facility-administered medications for this visit.  Facility-Administered Medications Ordered in Other Visits:    0.9 %  sodium chloride infusion, , Intravenous, Continuous, [name], MD, Last Rate: 500 mL/hr at 08/04/22 1211, New Bag at 08/04/22 1211   heparin lock flush 100 unit/mL, 500 Units, Intracatheter, Once PRN, [name], MD   heparin lock flush 100 unit/mL, 500 Units, Intracatheter, Once PRN, [name], MD   sodium chloride flush (NS) 0.9 % injection 10 mL, 10 mL, Intracatheter, PRN, [name], MD   sodium chloride flush (NS) 0.9 % injection 10 mL, 10 mL, Intracatheter, PRN, [name], MD   Allergies: No Known Allergies  REVIEW OF SYSTEMS:   Review of Systems  Constitutional:  Negative for  chills, fatigue and fever.  HENT:   Negative for lump/mass, mouth sores, nosebleeds, sore throat and trouble swallowing.   Eyes:  Negative for eye problems.  Respiratory:  Positive for cough and shortness of breath.   Cardiovascular:  Negative for chest pain, leg swelling and palpitations.  Gastrointestinal:  Negative for abdominal pain, constipation, diarrhea, nausea and vomiting.  Genitourinary:  Negative for bladder incontinence, difficulty urinating, dysuria, frequency, hematuria and nocturia.   Musculoskeletal:  Positive for back pain. Negative for arthralgias, flank pain, myalgias and neck pain.  Skin:  Negative for itching and rash.  Neurological:  Negative for dizziness, headaches and numbness.  Hematological:  Does not bruise/bleed easily.  Psychiatric/Behavioral:  Negative for depression, sleep disturbance and suicidal ideas. The patient is not nervous/anxious.   All other systems reviewed and are negative.    VITALS:   Blood pressure (!) 82/40, pulse (!) 105, temperature (!) 96.9 F (36.1 C), temperature source Tympanic, resp. rate 18, SpO2 99%.  Wt Readings from Last 3 Encounters:  07/14/22 154 lb 9.6 oz (70.1 kg)  07/07/22 157 lb (71.2 kg)  06/22/22 163 lb 3.2 oz (74 kg)    There is no height or weight on file to calculate BMI.  Performance status (ECOG): 1 - Symptomatic but completely ambulatory  PHYSICAL EXAM:   Physical Exam Vitals and nursing note reviewed. Exam conducted with a chaperone present.  Constitutional:      Appearance: Normal appearance.  Cardiovascular:     Rate and Rhythm: Normal rate and regular rhythm.     Pulses: Normal pulses.     Heart sounds: Normal heart sounds.  Pulmonary:     Effort: Pulmonary effort is normal.     Breath sounds: Normal breath sounds.  Abdominal:     Palpations: Abdomen is soft. There is no hepatomegaly, splenomegaly or mass.     Tenderness: There is no abdominal tenderness.  Musculoskeletal:     Right lower leg: No  edema.     Left lower leg: No edema.  Lymphadenopathy:     Cervical: No cervical adenopathy.     Right cervical: No superficial, deep or posterior cervical adenopathy.    Left cervical: No superficial, deep or posterior cervical adenopathy.     Upper Body:     Right upper body: No supraclavicular or axillary adenopathy.     Left upper body: No supraclavicular or axillary adenopathy.  Neurological:     General: No focal deficit present.     Mental Status: He is alert and oriented to person, place, and time.  Psychiatric:        Mood and Affect: Mood normal.        Behavior: Behavior normal.     LABS:      Latest Ref Rng & Units 08/04/2022   11:09 AM 07/14/2022   11:33 AM 06/22/2022   10:46 AM  CBC  WBC 4.0 - 10.5 K/uL 7.2  9.0  8.2   Hemoglobin 13.0 - 17.0 g/dL 9.6  16.1  09.6   Hematocrit 39.0 - 52.0 % 30.6  31.9  33.9   Platelets 150 - 400 K/uL 295  363  316       Latest Ref Rng & Units 08/04/2022   11:09 AM 07/14/2022   11:33 AM 06/22/2022   10:46 AM  CMP  Glucose 70 - 99 mg/dL 045  409  811   BUN 8 - 23 mg/dL 24  23  15    Creatinine 0.61 - 1.24 mg/dL 9.14  7.82  9.56  Sodium 135 - 145 mmol/L 138  136  134   Potassium 3.5 - 5.1 mmol/L 4.5  4.6  4.1   Chloride 98 - 111 mmol/L 103  101  100   CO2 22 - 32 mmol/L 24  26  26    Calcium 8.9 - 10.3 mg/dL 16.1  09.6  9.9   Total Protein 6.5 - 8.1 g/dL 7.7  7.6  7.4   Total Bilirubin 0.3 - 1.2 mg/dL 0.4  0.7  0.7   Alkaline Phos 38 - 126 U/L 53  48  57   AST 15 - 41 U/L 16  21  13    ALT 0 - 44 U/L 17  24  11       Lab Results  Component Value Date   CEA 2.2 01/19/2013   /  CEA  Date Value Ref Range Status  01/19/2013 2.2 0.0 - 5.0 ng/mL Final    Comment:    Performed at [facility]   No results found for: "PSA1" No results found for: "EAV409" No results found for: "CAN125"  No results found for: "TOTALPROTELP", "ALBUMINELP", "A1GS", "A2GS", "BETS", "BETA2SER", "GAMS", "MSPIKE", "SPEI" Lab Results   Component Value Date   TIBC 244 (L) 08/04/2022   TIBC 251 05/12/2022   FERRITIN 273 08/04/2022   FERRITIN 195 05/12/2022   IRONPCTSAT 19 08/04/2022   IRONPCTSAT 18 05/12/2022   Lab Results  Component Value Date   LDH 172 11/12/2020   LDH 175 10/26/2018   LDH 179 10/25/2013     STUDIES:   No results found.

## 2022-08-04 ENCOUNTER — Inpatient Hospital Stay: Payer: 59 | Admitting: Hematology

## 2022-08-04 ENCOUNTER — Ambulatory Visit (HOSPITAL_COMMUNITY)
Admission: RE | Admit: 2022-08-04 | Discharge: 2022-08-04 | Disposition: A | Discharge status: Home / Self Care | Payer: 59 | Source: Ambulatory Visit | Attending: Hematology | Admitting: Hematology

## 2022-08-04 ENCOUNTER — Inpatient Hospital Stay: Payer: 59

## 2022-08-04 VITALS — BP 116/54 | HR 80 | Temp 97.1°F | Resp 18 | Wt 153.2 lb

## 2022-08-04 VITALS — BP 82/40 | HR 105 | Temp 96.9°F | Resp 18

## 2022-08-04 DIAGNOSIS — C069 Malignant neoplasm of mouth, unspecified: Secondary | ICD-10-CM | POA: Diagnosis not present

## 2022-08-04 DIAGNOSIS — Z79620 Long term (current) use of immunosuppressive biologic: Secondary | ICD-10-CM | POA: Diagnosis not present

## 2022-08-04 DIAGNOSIS — I959 Hypotension, unspecified: Secondary | ICD-10-CM | POA: Diagnosis not present

## 2022-08-04 DIAGNOSIS — C049 Malignant neoplasm of floor of mouth, unspecified: Secondary | ICD-10-CM

## 2022-08-04 DIAGNOSIS — R0602 Shortness of breath: Secondary | ICD-10-CM | POA: Diagnosis not present

## 2022-08-04 DIAGNOSIS — Z79899 Other long term (current) drug therapy: Secondary | ICD-10-CM

## 2022-08-04 DIAGNOSIS — C787 Secondary malignant neoplasm of liver and intrahepatic bile duct: Secondary | ICD-10-CM | POA: Diagnosis not present

## 2022-08-04 DIAGNOSIS — Z95828 Presence of other vascular implants and grafts: Secondary | ICD-10-CM

## 2022-08-04 DIAGNOSIS — R062 Wheezing: Secondary | ICD-10-CM | POA: Diagnosis not present

## 2022-08-04 DIAGNOSIS — Z5112 Encounter for antineoplastic immunotherapy: Secondary | ICD-10-CM | POA: Diagnosis not present

## 2022-08-04 DIAGNOSIS — R059 Cough, unspecified: Secondary | ICD-10-CM | POA: Diagnosis not present

## 2022-08-04 DIAGNOSIS — D508 Other iron deficiency anemias: Secondary | ICD-10-CM

## 2022-08-04 DIAGNOSIS — Z923 Personal history of irradiation: Secondary | ICD-10-CM | POA: Diagnosis not present

## 2022-08-04 DIAGNOSIS — Z87891 Personal history of nicotine dependence: Secondary | ICD-10-CM | POA: Diagnosis not present

## 2022-08-04 DIAGNOSIS — Z809 Family history of malignant neoplasm, unspecified: Secondary | ICD-10-CM | POA: Diagnosis not present

## 2022-08-04 DIAGNOSIS — D649 Anemia, unspecified: Secondary | ICD-10-CM | POA: Diagnosis not present

## 2022-08-04 LAB — IRON AND TIBC
Iron: 45 ug/dL (ref 45–182)
Saturation Ratios: 19 % (ref 17.9–39.5)
TIBC: 244 ug/dL — ABNORMAL LOW (ref 250–450)
UIBC: 199 ug/dL

## 2022-08-04 LAB — COMPREHENSIVE METABOLIC PANEL
ALT: 17 U/L (ref 0–44)
AST: 16 U/L (ref 15–41)
Albumin: 3.4 g/dL — ABNORMAL LOW (ref 3.5–5.0)
Alkaline Phosphatase: 53 U/L (ref 38–126)
Anion gap: 11 (ref 5–15)
BUN: 24 mg/dL — ABNORMAL HIGH (ref 8–23)
CO2: 24 mmol/L (ref 22–32)
Calcium: 10.3 mg/dL (ref 8.9–10.3)
Chloride: 103 mmol/L (ref 98–111)
Creatinine, Ser: 1.23 mg/dL (ref 0.61–1.24)
GFR, Estimated: 59 mL/min — ABNORMAL LOW (ref >60)
Glucose, Bld: 112 mg/dL — ABNORMAL HIGH (ref 70–99)
Potassium: 4.5 mmol/L (ref 3.5–5.1)
Sodium: 138 mmol/L (ref 135–145)
Total Bilirubin: 0.4 mg/dL (ref 0.3–1.2)
Total Protein: 7.7 g/dL (ref 6.5–8.1)

## 2022-08-04 LAB — CBC WITH DIFFERENTIAL/PLATELET
Abs Immature Granulocytes: 0.02 10*3/uL (ref 0.00–0.07)
Basophils Absolute: 0.1 10*3/uL (ref 0.0–0.1)
Basophils Relative: 1 %
Eosinophils Absolute: 0.3 10*3/uL (ref 0.0–0.5)
Eosinophils Relative: 4 %
HCT: 30.6 % — ABNORMAL LOW (ref 39.0–52.0)
Hemoglobin: 9.6 g/dL — ABNORMAL LOW (ref 13.0–17.0)
Immature Granulocytes: 0 %
Lymphocytes Relative: 20 %
Lymphs Abs: 1.5 10*3/uL (ref 0.7–4.0)
MCH: 31.7 pg (ref 26.0–34.0)
MCHC: 31.4 g/dL (ref 30.0–36.0)
MCV: 101 fL — ABNORMAL HIGH (ref 80.0–100.0)
Monocytes Absolute: 0.8 10*3/uL (ref 0.1–1.0)
Monocytes Relative: 11 %
Neutro Abs: 4.6 10*3/uL (ref 1.7–7.7)
Neutrophils Relative %: 64 %
Platelets: 295 10*3/uL (ref 150–400)
RBC: 3.03 MIL/uL — ABNORMAL LOW (ref 4.22–5.81)
RDW: 13.2 % (ref 11.5–15.5)
WBC: 7.2 10*3/uL (ref 4.0–10.5)
nRBC: 0 % (ref 0.0–0.2)

## 2022-08-04 LAB — FERRITIN: Ferritin: 273 ng/mL (ref 24–336)

## 2022-08-04 LAB — TSH: TSH: 3.008 u[IU]/mL (ref 0.350–4.500)

## 2022-08-04 LAB — MAGNESIUM: Magnesium: 2.1 mg/dL (ref 1.7–2.4)

## 2022-08-04 MED ORDER — SODIUM CHLORIDE 0.9% FLUSH
10.0000 mL | INTRAVENOUS | Status: DC | PRN
  Administered 2022-08-04: 10 mL via INTRAVENOUS

## 2022-08-04 MED ORDER — HEPARIN SOD (PORK) LOCK FLUSH 100 UNIT/ML IV SOLN
500.0000 [IU] | Freq: Once | INTRAVENOUS | Status: AC | PRN
  Administered 2022-08-04: 500 [IU]

## 2022-08-04 MED ORDER — SODIUM CHLORIDE 0.9 % IV SOLN: Freq: Once | INTRAVENOUS | Status: AC

## 2022-08-04 MED ORDER — SODIUM CHLORIDE 0.9 % IV SOLN: INTRAVENOUS | Status: DC

## 2022-08-04 MED ORDER — SODIUM CHLORIDE 0.9% FLUSH
10.0000 mL | INTRAVENOUS | Status: DC | PRN
  Administered 2022-08-04: 10 mL

## 2022-08-04 MED ORDER — IPRATROPIUM-ALBUTEROL 0.5-2.5 (3) MG/3ML IN SOLN
3.0000 mL | Freq: Once | RESPIRATORY_TRACT | Status: AC
  Administered 2022-08-04: 3 mL via RESPIRATORY_TRACT
  Filled 2022-08-04: qty 3

## 2022-08-04 MED ORDER — SODIUM CHLORIDE 0.9 % IV SOLN
200.0000 mg | Freq: Once | INTRAVENOUS | Status: AC
  Administered 2022-08-04: 200 mg via INTRAVENOUS
  Filled 2022-08-04: qty 8

## 2022-08-04 NOTE — Progress Notes (Signed)
Patient presents today for Keytruda infusion per providers order.  Vital signs and labs reviewed by MD.  Message received from Chapman Moss RN/Dr. Ellin Saba patient okay for treatment and will receive 500 cc saline bolus and Duo-neb with treatment.  Treatment given today per MD orders.  Tolerated infusion without adverse affects.  Vital signs stable.  Per MD patient to stop by Xray and get a chest Xray before leaving the hospital.  No complaints at this time.  Discharge from clinic via wheelchair in stable condition.  Alert and oriented X 3.  Follow up with [facility] as scheduled.

## 2022-08-04 NOTE — Progress Notes (Signed)
Patients port flushed without difficulty.  Good blood return noted with no bruising or swelling noted at site.  Patient remains accessed for treatment.  

## 2022-08-04 NOTE — Patient Instructions (Signed)
[facility] at [facility] Discharge Instructions   You were seen and examined today by Dr. Ellin Saba.  He reviewed the results of your lab work which are normal/stable.   We will proceed with your treatment today.   Your blood pressure is low today at 82/40. Dr. Kirtland Bouchard wants you to stop taking amlodipine and hydralazine. Continue taking the carvedilol.   Return as scheduled.    Thank you for choosing [facility] at [facility] to provide your oncology and hematology care.  To afford each patient quality time with our provider, please arrive at least 15 minutes before your scheduled appointment time.   If you have a lab appointment with the Cancer Center please come in thru the Main Entrance and check in at the main information desk.  You need to re-schedule your appointment should you arrive 10 or more minutes late.  We strive to give you quality time with our providers, and arriving late affects you and other patients whose appointments are after yours.  Also, if you no show three or more times for appointments you may be dismissed from the clinic at the providers discretion.     Again, thank you for choosing [facility].  Our hope is that these requests will decrease the amount of time that you wait before being seen by our physicians.       _____________________________________________________________  Should you have questions after your visit to [facility], please contact our office at [phone] and follow the prompts.  Our office hours are 8:00 a.m. and 4:30 p.m. Monday - Friday.  Please note that voicemails left after 4:00 p.m. may not be returned until the following business day.  We are closed weekends and major holidays.  You do have access to a nurse 24-7, just call the main number to the clinic [phone] and do not press any options, hold on the line and a nurse will answer the phone.    For prescription  refill requests, have your pharmacy contact our office and allow 72 hours.    Due to Covid, you will need to wear a mask upon entering the hospital. If you do not have a mask, a mask will be given to you at the Main Entrance upon arrival. For doctor visits, patients may have 1 support person age 15 or older with them. For treatment visits, patients can not have anyone with them due to social distancing guidelines and our immunocompromised population.

## 2022-08-04 NOTE — Progress Notes (Signed)
Patient has been examined by Dr. Ellin Saba. Vital signs (BP 82/40) and labs have been reviewed by MD - ANC, Creatinine, LFTs, hemoglobin, and platelets are within treatment parameters per M.D. - pt may proceed with treatment. NS 500 ml over 1 hour for hypotension per MD. Primary RN and pharmacy notified.

## 2022-08-04 NOTE — Patient Instructions (Signed)
[facility]  Discharge Instructions: Thank you for choosing [facility] to provide your oncology and hematology care.  If you have a lab appointment with the Cancer Center - please note that after April 8th, 2024, all labs will be drawn in the cancer center.  You do not have to check in or register with the main entrance as you have in the past but will complete your check-in in the cancer center.  Wear comfortable clothing and clothing appropriate for easy access to any Portacath or PICC line.   We strive to give you quality time with your provider. You may need to reschedule your appointment if you arrive late (15 or more minutes).  Arriving late affects you and other patients whose appointments are after yours.  Also, if you miss three or more appointments without notifying the office, you may be dismissed from the clinic at the provider's discretion.      For prescription refill requests, have your pharmacy contact our office and allow 72 hours for refills to be completed.    Today you received the following chemotherapy and/or immunotherapy agents Keytruda/saline bolus/duo-neb      To help prevent nausea and vomiting after your treatment, we encourage you to take your nausea medication as directed.  BELOW ARE SYMPTOMS THAT SHOULD BE REPORTED IMMEDIATELY: *FEVER GREATER THAN 100.4 F (38 C) OR HIGHER *CHILLS OR SWEATING *NAUSEA AND VOMITING THAT IS NOT CONTROLLED WITH YOUR NAUSEA MEDICATION *UNUSUAL SHORTNESS OF BREATH *UNUSUAL BRUISING OR BLEEDING *URINARY PROBLEMS (pain or burning when urinating, or frequent urination) *BOWEL PROBLEMS (unusual diarrhea, constipation, pain near the anus) TENDERNESS IN MOUTH AND THROAT WITH OR WITHOUT PRESENCE OF ULCERS (sore throat, sores in mouth, or a toothache) UNUSUAL RASH, SWELLING OR PAIN  UNUSUAL VAGINAL DISCHARGE OR ITCHING   Items with * indicate a potential emergency and should be followed up as soon as  possible or go to the Emergency Department if any problems should occur.  Please show the CHEMOTHERAPY ALERT CARD or IMMUNOTHERAPY ALERT CARD at check-in to the Emergency Department and triage nurse.  Should you have questions after your visit or need to cancel or reschedule your appointment, please contact [facility] [phone]  and follow the prompts.  Office hours are 8:00 a.m. to 4:30 p.m. Monday - Friday. Please note that voicemails left after 4:00 p.m. may not be returned until the following business day.  We are closed weekends and major holidays. You have access to a nurse at all times for urgent questions. Please call the main number to the clinic [phone] and follow the prompts.  For any non-urgent questions, you may also contact your provider using MyChart. We now offer e-Visits for anyone 42 and older to request care online for non-urgent symptoms. For details visit [email or web address].   Also download the MyChart app! Go to the app store, search "MyChart", open the app, select [place], and log in with your MyChart username and password.

## 2022-08-05 ENCOUNTER — Other Ambulatory Visit: Payer: Self-pay

## 2022-08-06 DIAGNOSIS — Z930 Tracheostomy status: Secondary | ICD-10-CM | POA: Diagnosis not present

## 2022-08-06 NOTE — Progress Notes (Signed)
Nathaniel Hicks, male    DOB: 08-29-1940    MRN: [id number]   Brief patient profile:  81 yobm  quit smoking Oct  2023 p admission to [facility]  referred to pulmonary clinic in [place]  07/07/2022 by Dr  Glendora Score for sob with wheezing .    Admit date: 10/14/2021 Discharge date: 10/28/2021   Admission Diagnoses: Oral cancer   Discharge Diagnoses:  Principal Problem:   Oral cancer (HCC) Active Problems:   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HCC)   Malnutrition of moderate degree     Discharged Condition: good   Hospital Course: 82 year old male presented to the hospital for surgical management of floor of mouth cancer.  See operative note.  He was observed in the ICU after surgery with a new tracheostomy and drains and an NG feeding tube in place.  He had difficulty with nausea and vomiting the first couple of days that prevented starting nutrition and required replacement of the NG tube.  The hospitalist service was asked to consult.  He had a low grade fever a couple of days after surgery presumed to be related to aspiration and was started on Unasyn.  The fever and vomiting improved.  The flap under his tongue remained intact through his hospitalization but became dusky.  There was good blood flow with needle exploration.  He was transferred to a regular hospital room receiving tube feeds.  His tracheostomy tube was changed to a cuffless tube.  Drains were removed.  He completed 7 days of Unasyn.  He worked with Psychologist, sport and exercise, PT, and OT.  Ultimately, his feeding tube was removed and his diet was advanced and evaluated by speech pathology.  At the time of discharge, he is tolerating an oral diet.  His tracheostomy remains in place and he is tolerating the Passy-Muir valve.  The flap under the tongue is evolving with some necrosis of the skin that may later require debridement.  He is being discharged to inpatient rehab for further recovery.   Consults:  Hospitalist, SLP, PT, OT    Significant Diagnostic Studies: None   Treatments: surgery: Floor of mouth resection, tracheostomy, bilateral neck dissections, and platysma flap     History of Present Illness  07/07/2022  Pulmonary/ 1st office eval/ Nathaniel Hicks / [facility] on ACEi  Chief Complaint  Patient presents with   Pulmonary Consult    Referred by Dr. Glendora Score.    Dyspnea:  more limited by fatigue/weakness from RT than sob  Cough: cough / wheezing at night Sleep: bed is flat/ 2 pillows does fine despite "wheezing" SABA use: just symbicort 80 not sure it's doing anything  02: none  Tol pureed diet ok  Rec Stop amlodipine-benazepril  and start amlopine 10-olmesartan 40 mg one daily in its place Stop amlodipine if you have it in a separate bottle and keep it separate. Then take all the bottles with you to Dr Scharlene Gloss office   Please schedule a follow up office visit in 4 weeks, sooner if needed  with all medications /inhalers/ solutions in hand   08/07/2022  f/u ov/[name]/Nathaniel Hicks re: ? Acei case?/  maint on ? did not  bring meds  Last RT about  one month/ last Chemo 08/04/22 Chief Complaint  Patient presents with   Follow-up    Cough and SOB are unchanged since the last visit.    Dyspnea:  amb with cane     Cough: some better off acei /  non productive raspy upper airway apttern  Sleeping: bed is flat / 3 pillows on side  SABA use: symbicort 80 2bid and not using much saba  Puree swallowing ok     No obvious day to day or daytime variability or assoc excess/ purulent sputum or mucus plugs or hemoptysis or cp or chest tightness, subjective wheeze or overt sinus or hb symptoms.   Sleeping ok as above without nocturnal  or early am exacerbation  of respiratory  c/o's or need for noct saba. Also denies any obvious fluctuation of symptoms with weather or environmental changes or other aggravating or alleviating factors except as outlined above   No unusual exposure hx or h/o childhood pna/  asthma or knowledge of premature birth.  Current Allergies, Complete Past Medical History, Past Surgical History, Family History, and Social History were reviewed in [employer] record.  ROS  The following are not active complaints unless bolded Hoarseness, sore throat, dysphagia, dental problems, itching, sneezing,  nasal congestion or discharge of excess mucus or purulent secretions, ear ache,   fever, chills, sweats, unintended wt loss or wt gain, classically pleuritic or exertional cp,  orthopnea pnd or arm/hand swelling  or leg swelling, presyncope, palpitations, abdominal pain, anorexia, nausea, vomiting, diarrhea  or change in bowel habits or change in bladder habits, change in stools or change in urine, dysuria, hematuria,  rash, arthralgias, visual complaints, headache, numbness, weakness or ataxia or problems with walking or coordination,  change in mood or  memory.        Current Meds - - NOTE:   Unable to verify as accurately reflecting what pt takes    Medication Sig   acetaminophen (TYLENOL) 650 MG CR tablet Take 650 mg by mouth every 8 (eight) hours as needed for pain.   budesonide-formoterol (SYMBICORT) 80-4.5 MCG/ACT inhaler Inhale 2 puffs into the lungs in the morning and at bedtime.   carvedilol (COREG) 6.25 MG tablet Take 1 tablet (6.25 mg total) by mouth 2 (two) times daily with a meal.   Cholecalciferol (VITAMIN D3) 50 MCG (2000 UT) TABS Take 2,000 Units by mouth daily.   hydrALAZINE (APRESOLINE) 25 MG tablet Take 25 mg by mouth 2 (two) times daily.   HYDROcodone-acetaminophen (HYCET) 7.5-325 mg/15 ml solution Take 15 mLs by mouth every 6 (six) hours as needed for moderate pain.   lidocaine (XYLOCAINE) 2 % solution Take by mouth.   magnesium oxide (MAG-OX) 400 MG tablet Take 400 mg by mouth daily.   Menthol, Topical Analgesic, (BIOFREEZE EX) Apply 1 application  topically daily as needed (pain).   naloxone (NARCAN) nasal spray 4 mg/0.1 mL SMARTSIG:Both  Nares   PEMBROLIZUMAB IV Inject into the vein every 21 ( twenty-one) days.   Polyethyl Glycol-Propyl Glycol (GOODSENSE LUBRICANT EYE DROPS OP) Place 1 drop into both eyes daily as needed (dry eyes).   pravastatin (PRAVACHOL) 80 MG tablet Take 80 mg by mouth daily.   RAPAFLO 8 MG CAPS capsule Take 8 mg by mouth daily. Silodosin   vitamin B-12 (CYANOCOBALAMIN) 500 MCG tablet Take 500 mcg by mouth daily.           Past Medical History:  Diagnosis Date   Arthritis    BPH (benign prostatic hyperplasia)    Difficult intubation    HOH (hard of hearing)    Hyperlipidemia 11/02/2017   Hypertension    Hypertension 11/02/2017   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HCC)    2014   Vitamin D deficiency 11/02/2017  Objective:     Wt Readings from Last 3 Encounters:  08/07/22 150 lb (68 kg)  08/04/22 153 lb 3.5 oz (69.5 kg)  07/14/22 154 lb 9.6 oz (70.1 kg)     Vital signs reviewed  08/07/2022  - Note at rest 02 sats  99% on RA   General appearance:    amb with cane very hard to understand speech with tongue swelling    HEENT : Oropharynx  clear  with obvious tongue deformity /protrusion superiorly    NECK :  without  apparent JVD/ palpable Nodes/TM    LUNGS: no acc muscle use,  Min barrel  contour chest wall with bilateral  insp and exp rhonchi and transmitted noise from upper airway and  without cough on insp or exp maneuvers and min  Hyperresonant  to  percussion bilaterally    CV:  RRR  no s3 or murmur or increase in P2, and no edema   ABD:  soft and nontender with pos end  insp Hoover's  in the supine position.  No bruits or organomegaly appreciated   MS:  Nl gait/ ext warm without deformities Or obvious joint restrictions  calf tenderness, cyanosis or clubbing     SKIN: warm and dry without lesions    NEURO:  alert, approp, nl sensorium with  no motor or cerebellar deficits apparent.             I personally reviewed images and agree with radiology impression as follows: PET CT   06/07/22 1. 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 2. Enlarged prostate. 3. Aortic atherosclerosis (ICD10-I70.0). Coronary artery calcification. 4.  Emphysema (ICD10-J43.9).  Assessment

## 2022-08-07 ENCOUNTER — Encounter: Payer: Self-pay | Admitting: Internal Medicine

## 2022-08-07 ENCOUNTER — Telehealth: Payer: Self-pay | Admitting: Internal Medicine

## 2022-08-07 ENCOUNTER — Ambulatory Visit (INDEPENDENT_AMBULATORY_CARE_PROVIDER_SITE_OTHER): Payer: 59 | Admitting: Internal Medicine

## 2022-08-07 VITALS — BP 102/58 | HR 112 | Ht 71.0 in | Wt 150.0 lb

## 2022-08-07 DIAGNOSIS — I10 Essential (primary) hypertension: Secondary | ICD-10-CM | POA: Diagnosis not present

## 2022-08-07 DIAGNOSIS — R0609 Other forms of dyspnea: Secondary | ICD-10-CM

## 2022-08-07 MED ORDER — PREDNISONE 10 MG PO TABS: ORAL_TABLET | ORAL | 0 refills | Status: DC

## 2022-08-07 NOTE — Telephone Encounter (Signed)
Spoke with the pt and notified that the pred was sent

## 2022-08-07 NOTE — Telephone Encounter (Signed)
I had intended to write rx for pred at ov so let him know to pick it up

## 2022-08-07 NOTE — Assessment & Plan Note (Signed)
07/07/22 Try off acei due to hoarseness/ cough > improved 08/07/2022   Although even in retrospect it may not be clear the ACEi contributed to the pt's symptoms,  Pt improved somewhat off them and adding them back at this point or in the future would risk confusion in interpretation of non-specific respiratory symptoms to which this patient is prone  ie  Better not to muddy the waters here.   Will d/c hydralazine as bp over controlled at present         Each maintenance medication was reviewed in detail including emphasizing most importantly the difference between maintenance and prns and under what circumstances the prns are to be triggered using an action plan format where appropriate.  Total time for H and P, chart review, counseling, reviewing hfa device(s) and generating customized AVS unique to this office visit / same day charting > 30 min for   refractory respiratory  symptoms of uncertain etiology

## 2022-08-07 NOTE — Patient Instructions (Addendum)
Symbicort 80 Take 2 puffs first thing in am and then another 2 puffs about 12 hours later.     Work on inhaler technique:  relax and gently blow all the way out then take a nice smooth full deep breath back in, triggering the inhaler at same time you start breathing in.  Hold breath in for at least  5 seconds if you can. Blow out symbicort 80  thru nose. Rinse and gargle with water when done.  If mouth or throat bother you at all,  try brushing teeth/gums/tongue with arm and hammer toothpaste/ make a slurry and gargle and spit out.  >>>  Remember how golfers warm up by taking practice swings - do this with an empty inhaler   Continue to leave off the hydralazine   If our list is not correct please call us back with the discrepancy   Please schedule a follow up office visit in 6 weeks, call sooner if needed with all medications /inhalers/ solutions in hand so we can verify exactly what you are taking. This includes all medications from all doctors and over the counters

## 2022-08-07 NOTE — Assessment & Plan Note (Signed)
Quit smoking 10/2021 p prolonged admit/ rehab for sq cell ca floor of mouth> transient trach - 07/07/2022   Walked on RA  x  2  lap(s) =  approx 300  ft  @ slow/mod pace, stopped due to legs got tire/ no sob with lowest 02 sats 95%   - 08/07/2022  After extensive coaching inhaler device,  effectiveness =    75% from a baseline of < 50%  (not triggering simultaneously with insp) > continue symb 80   Continues to have have mostly upper airway wheezing and coughing though he says cough is  improved from prior ov and higher doses of ICS may risk irritating the upper airway more than helping the lower so rec  Prednisone 10 mg take  4 each am x 2 days,   2 each am x 2 days,  1 each am x 2 days and stop  Continue symbicort 80 2bid Continue max gerd rx  Continue off ace (see hbp)   F/u in 6 weeks with all meds in hand using a trust but verify approach to confirm accurate Medication  Reconciliation The principal here is that until we are certain that the  patients are doing what we've asked, it makes no sense to ask them to do more.

## 2022-08-08 DIAGNOSIS — Z930 Tracheostomy status: Secondary | ICD-10-CM | POA: Diagnosis not present

## 2022-08-09 ENCOUNTER — Other Ambulatory Visit: Payer: Self-pay

## 2022-08-14 ENCOUNTER — Encounter: Payer: Self-pay | Admitting: Hematology

## 2022-08-18 DIAGNOSIS — R062 Wheezing: Secondary | ICD-10-CM | POA: Diagnosis not present

## 2022-08-18 DIAGNOSIS — C048 Malignant neoplasm of overlapping sites of floor of mouth: Secondary | ICD-10-CM | POA: Diagnosis not present

## 2022-08-18 DIAGNOSIS — Z923 Personal history of irradiation: Secondary | ICD-10-CM | POA: Diagnosis not present

## 2022-08-18 DIAGNOSIS — R0600 Dyspnea, unspecified: Secondary | ICD-10-CM | POA: Diagnosis not present

## 2022-08-18 DIAGNOSIS — C787 Secondary malignant neoplasm of liver and intrahepatic bile duct: Secondary | ICD-10-CM | POA: Diagnosis not present

## 2022-08-18 DIAGNOSIS — Z510 Encounter for antineoplastic radiation therapy: Secondary | ICD-10-CM | POA: Diagnosis not present

## 2022-08-20 ENCOUNTER — Other Ambulatory Visit: Payer: Self-pay | Admitting: *Deleted

## 2022-08-20 MED ORDER — HYDROCODONE-ACETAMINOPHEN 7.5-325 MG/15ML PO SOLN: 15.0000 mL | Freq: Four times a day (QID) | ORAL | 0 refills | Status: DC | PRN

## 2022-08-21 ENCOUNTER — Other Ambulatory Visit: Payer: Self-pay

## 2022-08-24 ENCOUNTER — Inpatient Hospital Stay (HOSPITAL_BASED_OUTPATIENT_CLINIC_OR_DEPARTMENT_OTHER): Payer: 59 | Admitting: Hematology

## 2022-08-24 ENCOUNTER — Inpatient Hospital Stay: Payer: 59

## 2022-08-24 ENCOUNTER — Inpatient Hospital Stay: Payer: 59 | Attending: Hematology

## 2022-08-24 VITALS — BP 127/51 | HR 88 | Temp 96.3°F | Resp 18

## 2022-08-24 DIAGNOSIS — Z5112 Encounter for antineoplastic immunotherapy: Secondary | ICD-10-CM | POA: Diagnosis not present

## 2022-08-24 DIAGNOSIS — C787 Secondary malignant neoplasm of liver and intrahepatic bile duct: Secondary | ICD-10-CM | POA: Insufficient documentation

## 2022-08-24 DIAGNOSIS — I959 Hypotension, unspecified: Secondary | ICD-10-CM | POA: Insufficient documentation

## 2022-08-24 DIAGNOSIS — Z8547 Personal history of malignant neoplasm of testis: Secondary | ICD-10-CM | POA: Diagnosis not present

## 2022-08-24 DIAGNOSIS — C049 Malignant neoplasm of floor of mouth, unspecified: Secondary | ICD-10-CM

## 2022-08-24 DIAGNOSIS — C048 Malignant neoplasm of overlapping sites of floor of mouth: Secondary | ICD-10-CM | POA: Diagnosis not present

## 2022-08-24 DIAGNOSIS — D539 Nutritional anemia, unspecified: Secondary | ICD-10-CM | POA: Insufficient documentation

## 2022-08-24 DIAGNOSIS — Z79620 Long term (current) use of immunosuppressive biologic: Secondary | ICD-10-CM | POA: Diagnosis not present

## 2022-08-24 DIAGNOSIS — Z87891 Personal history of nicotine dependence: Secondary | ICD-10-CM | POA: Insufficient documentation

## 2022-08-24 DIAGNOSIS — R0600 Dyspnea, unspecified: Secondary | ICD-10-CM | POA: Diagnosis not present

## 2022-08-24 DIAGNOSIS — R062 Wheezing: Secondary | ICD-10-CM | POA: Diagnosis not present

## 2022-08-24 DIAGNOSIS — Z510 Encounter for antineoplastic radiation therapy: Secondary | ICD-10-CM | POA: Diagnosis not present

## 2022-08-24 LAB — COMPREHENSIVE METABOLIC PANEL
ALT: 20 U/L (ref 0–44)
AST: 20 U/L (ref 15–41)
Albumin: 3.3 g/dL — ABNORMAL LOW (ref 3.5–5.0)
Alkaline Phosphatase: 48 U/L (ref 38–126)
Anion gap: 8 (ref 5–15)
BUN: 24 mg/dL — ABNORMAL HIGH (ref 8–23)
CO2: 26 mmol/L (ref 22–32)
Calcium: 10.3 mg/dL (ref 8.9–10.3)
Chloride: 101 mmol/L (ref 98–111)
Creatinine, Ser: 1.31 mg/dL — ABNORMAL HIGH (ref 0.61–1.24)
GFR, Estimated: 55 mL/min — ABNORMAL LOW (ref >60)
Glucose, Bld: 116 mg/dL — ABNORMAL HIGH (ref 70–99)
Potassium: 4.7 mmol/L (ref 3.5–5.1)
Sodium: 135 mmol/L (ref 135–145)
Total Bilirubin: 0.6 mg/dL (ref 0.3–1.2)
Total Protein: 7.1 g/dL (ref 6.5–8.1)

## 2022-08-24 LAB — CBC WITH DIFFERENTIAL/PLATELET
Abs Immature Granulocytes: 0.02 10*3/uL (ref 0.00–0.07)
Basophils Absolute: 0.1 10*3/uL (ref 0.0–0.1)
Basophils Relative: 1 %
Eosinophils Absolute: 0.2 10*3/uL (ref 0.0–0.5)
Eosinophils Relative: 3 %
HCT: 31.2 % — ABNORMAL LOW (ref 39.0–52.0)
Hemoglobin: 9.8 g/dL — ABNORMAL LOW (ref 13.0–17.0)
Immature Granulocytes: 0 %
Lymphocytes Relative: 20 %
Lymphs Abs: 1.4 10*3/uL (ref 0.7–4.0)
MCH: 31.8 pg (ref 26.0–34.0)
MCHC: 31.4 g/dL (ref 30.0–36.0)
MCV: 101.3 fL — ABNORMAL HIGH (ref 80.0–100.0)
Monocytes Absolute: 0.6 10*3/uL (ref 0.1–1.0)
Monocytes Relative: 9 %
Neutro Abs: 4.7 10*3/uL (ref 1.7–7.7)
Neutrophils Relative %: 67 %
Platelets: 283 10*3/uL (ref 150–400)
RBC: 3.08 MIL/uL — ABNORMAL LOW (ref 4.22–5.81)
RDW: 13.6 % (ref 11.5–15.5)
WBC: 6.9 10*3/uL (ref 4.0–10.5)
nRBC: 0 % (ref 0.0–0.2)

## 2022-08-24 LAB — MAGNESIUM: Magnesium: 1.9 mg/dL (ref 1.7–2.4)

## 2022-08-24 LAB — TSH: TSH: 3.173 u[IU]/mL (ref 0.350–4.500)

## 2022-08-24 MED ORDER — SODIUM CHLORIDE 0.9% FLUSH
10.0000 mL | Freq: Once | INTRAVENOUS | Status: AC
  Administered 2022-08-24: 10 mL via INTRAVENOUS

## 2022-08-24 MED ORDER — SODIUM CHLORIDE 0.9 % IV SOLN
200.0000 mg | Freq: Once | INTRAVENOUS | Status: AC
  Administered 2022-08-24: 200 mg via INTRAVENOUS
  Filled 2022-08-24: qty 8

## 2022-08-24 MED ORDER — HEPARIN SOD (PORK) LOCK FLUSH 100 UNIT/ML IV SOLN
500.0000 [IU] | Freq: Once | INTRAVENOUS | Status: AC | PRN
  Administered 2022-08-24: 500 [IU]

## 2022-08-24 MED ORDER — SODIUM CHLORIDE 0.9 % IV SOLN: Freq: Once | INTRAVENOUS | Status: AC

## 2022-08-24 MED ORDER — SODIUM CHLORIDE 0.9% FLUSH
10.0000 mL | INTRAVENOUS | Status: DC | PRN
  Administered 2022-08-24: 10 mL

## 2022-08-24 NOTE — Patient Instructions (Addendum)
[facility] at [facility] Discharge Instructions   You were seen and examined today by Dr. Ellin Saba.  He reviewed the results of your lab work which are normal/stable.   We will proceed with your treatment today.   Stop taking amlodipine-olmesartan and hydralzine as your blood pressure has been running low.   Return as scheduled.    Thank you for choosing [facility] at [facility] to provide your oncology and hematology care.  To afford each patient quality time with our provider, please arrive at least 15 minutes before your scheduled appointment time.   If you have a lab appointment with the Cancer Center please come in thru the Main Entrance and check in at the main information desk.  You need to re-schedule your appointment should you arrive 10 or more minutes late.  We strive to give you quality time with our providers, and arriving late affects you and other patients whose appointments are after yours.  Also, if you no show three or more times for appointments you may be dismissed from the clinic at the providers discretion.     Again, thank you for choosing [facility].  Our hope is that these requests will decrease the amount of time that you wait before being seen by our physicians.       _____________________________________________________________  Should you have questions after your visit to [facility], please contact our office at [phone] and follow the prompts.  Our office hours are 8:00 a.m. and 4:30 p.m. Monday - Friday.  Please note that voicemails left after 4:00 p.m. may not be returned until the following business day.  We are closed weekends and major holidays.  You do have access to a nurse 24-7, just call the main number to the clinic [phone] and do not press any options, hold on the line and a nurse will answer the phone.    For prescription refill requests, have your pharmacy  contact our office and allow 72 hours.    Due to Covid, you will need to wear a mask upon entering the hospital. If you do not have a mask, a mask will be given to you at the Main Entrance upon arrival. For doctor visits, patients may have 1 support person age 43 or older with them. For treatment visits, patients can not have anyone with them due to social distancing guidelines and our immunocompromised population.

## 2022-08-24 NOTE — Progress Notes (Signed)
[facility] [address], [place] [address]    Clinic Day:  08/24/22   Referring physician: [name], MD  Patient Care Team: Nathaniel Stabile, MD as PCP - General (Internal Medicine) Nathaniel Askew, MD as Consulting Physician (Urology) Nathaniel Massed, MD as Medical Oncologist (Medical Oncology) Nathaniel Sarah, RN as Oncology Nurse Navigator (Medical Oncology)   ASSESSMENT & PLAN:   Assessment: 1.  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  Enlarged contralateral right submandibular node.  Enlargement of the left submandibular gland probably reactive.  No abscess. - Biopsy (07/18/2021) floor of the mouth: Invasive well to moderately differentiated keratinizing squamous cell carcinoma.  Tumor cells negative for p16. - 10/14/2021: Floor of the mouth resection, tracheostomy, bilateral selective neck dissections zones 1-3 by Dr. Jenne Hicks - Pathology: Invasive moderately differentiated keratinizing SCC, 4.1 cm, carcinoma invades for a depth of about 1.9 cm and involves saliva gland tissue.  Anterior, posterior, right, left resection margins are involved.  Deep resection margin is negative.  Metastatic carcinoma 1/3 level 1 lymph nodes.  3 level 2 and 3 level 3 lymph nodes negative for carcinoma.  0/10 lymph nodes involved in the left side neck zone 1, 2, 3 dissection.  ENE not identified.  LVI/perineural invasion not identified. - I have talked to pathologist.  Reexcision margins were considered negative.  He has a high risk feature which is T4 tumor. -  PET scan on 12/12/2021: Soft tissue thickening and calcification along the ventral aspect of the trachea with associated hypermetabolism corresponding to recent tracheostomy site.  7 mm right  paratracheal lymph node new with SUV 2.4.  Hypermetabolism along the dome of the right hepatic lobe with probable 2.4 cm low-attenuation lesion - Liver lesion biopsy (01/28/2022): Metastatic squamous cell carcinoma, keratinizing. - NGS testing: PD-L1 (22 C3): CPS: 100, CD274 (PD-L1) amplified, JAK2 amplified, PIK3CA pathogenic variant exon 10, T p53 pathogenic variant, MS-stable, TMB-low - Per Dr. Jenne Hicks, he also has local recurrence of disease in the floor of the mouth. - Cycle 1 of Keytruda started on 03/10/2022. - XRT to the floor of the mouth on 06/29/2022   2.  Social/family history: - He lives in his apartment by himself and is independent of ADLs and IADLs.  He does not drive.  He worked as a Curator and several other jobs.  Quit smoking 1 month ago.  He smoked 1 pack/week for more than 50 years. - 1 brother had agent orange related cancer.  Another brother also had cancer, type unknown to the patient.   3.  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 - He was on close surveillance rather than adjuvant chemotherapy.    Plan: 1.  Stage IV (PT4PN1 M1) SCC of the floor of the mouth, p16 negative: - He does not report any immunotherapy related side effects. - Reviewed labs today: Creatinine mildly elevated at 1.31.  LFTs are normal.  TSH is 3.17. - He reports improvement in breathing since he saw Dr. Sharee Hicks.  He - He reports decrease in energy levels.  He is due to start radiation therapy on Thursday, twice daily for 2 days. - Proceed with Keytruda today.  I plan on repeating PET scan prior to next visit.  2.  Oral pain: - Continue liquid hydrocodone as needed.  He has not required it in the last 1 week.   3.  Macrocytic anemia: - Hemoglobin is 9.8 with MCV of 101.  Anemia from inflammation from malignancy.  Will closely monitor.  Last ferritin was 273 and percent saturation of 19.  4.  Hypotension: - Blood pressure is 101/51 today.  He is continuing  amlodipine/olmesartan.  Hydralazine is on hold.    Orders Placed This Encounter  Procedures   NM PET Image Restag (PS) Skull Base To Thigh    Standing Status:   Future    Standing Expiration Date:   08/24/2023    Order Specific Question:   If indicated for the ordered procedure, I authorize the administration of a radiopharmaceutical per Radiology protocol    Answer:   Yes    Order Specific Question:   Preferred imaging location?    Answer:   [name]   Magnesium    Standing Status:   Future    Standing Expiration Date:   10/06/2023   CBC with Differential    Standing Status:   Future    Standing Expiration Date:   10/06/2023   Comprehensive metabolic panel    Standing Status:   Future    Standing Expiration Date:   10/06/2023   T4    Standing Status:   Future    Standing Expiration Date:   10/06/2023   TSH    Standing Status:   Future    Standing Expiration Date:   10/06/2023   Magnesium    Standing Status:   Future    Standing Expiration Date:   10/27/2023   CBC with Differential    Standing Status:   Future    Standing Expiration Date:   10/27/2023   Comprehensive metabolic panel    Standing Status:   Future    Standing Expiration Date:   10/27/2023   T4    Standing Status:   Future    Standing Expiration Date:   10/27/2023   TSH    Standing Status:   Future    Standing Expiration Date:   10/27/2023      Nathaniel Hicks,acting as a scribe for Nathaniel Massed, MD.,have documented all relevant documentation on the behalf of Nathaniel Massed, MD,as directed by  Nathaniel Massed, MD while in the presence of Nathaniel Massed, MD.  I, Nathaniel Massed MD, have reviewed the above documentation for accuracy and completeness, and I agree with the above.     Nathaniel Massed, MD   8/12/20244:58 PM  CHIEF COMPLAINT:   Diagnosis: squamous cell carcinoma of the floor of the mouth and testicular seminoma    Cancer Staging  Cancer of floor of mouth  [facility]) Staging form: Oral Cavity, AJCC 8th Edition - Clinical stage from 11/25/2021: Stage IVC (cT4a, cN1, pM1) - Signed by Nathaniel Massed, MD on 02/04/2022  Testicle cancer, right Staging form: Testis, AJCC 7th Edition - Clinical: Stage I (T2, N0, M0) - Signed by [name], PA-C on 07/23/2013    Prior Therapy: none  Current Therapy:  Pembrolizumab    HISTORY OF PRESENT ILLNESS:   Oncology History  Testicle cancer, right  12/20/2012 Surgery   Right total orchiectomyt- 1. Testis, biopsy, right - SEMINOMA. PLEASE SEE COMMENT. 2. Testis, tumor, right - SEMINOMA, 7.5 CM. - ANGIOLYMPHATIC INVASION PRESENT. - RESECTION MARGINS, NEGATIVE FOR ATYPIA OR MALIGNANCY.   01/18/2013 PET scan   Status post right orchiectomy. No findings  specific for metastatic disease. Small para-aortic nodes measuring up to 5 mm short axis, without convincing hypermetabolism. Given location, attention on follow-up is suggested.   04/17/2013 Imaging   CT CAP- No evidence of metastatic disease in the chest, abdomen or pelvis. Proximal LAD coronary artery calcification.   07/20/2013 Imaging   CT abd/pelvis- No evidence of metastatic disease in the abdomen or pelvis.   10/24/2013 Imaging   CT abd/pelvis- No findings to suggest metastatic disease in the abdomen or pelvis   10/24/2013 Imaging   Chest xray- Probable COPD.  Negative for metastatic disease.   02/21/2014 Imaging   CT abd/pelvis- Stable abdominal pelvic CT status post right orchectomy. No evidence of adenopathy or other metastatic disease.   02/21/2014 Imaging   Chest xray- Left lower lobe mild atelectasis and/or infiltrate.     09/10/2014 Imaging   CT abd/pelvis- Status post right orchiectomy.   No evidence of metastatic disease.   3 mm nonobstructing right upper pole renal calculus. No hydronephrosis.   03/14/2015 Imaging   CT abd/pelvis- Stable exam. No evidence of metastatic disease or other acute findings within the abdomen or pelvis.    09/13/2015 Imaging   CT abd/pelvis- No acute findings and no evidence for mass or adenopathy.    04/24/2016 Imaging   CT abd/pelvis: IMPRESSION: 1. Stable exam. No new or progressive findings. No features to suggest metastatic disease.   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HCC)  10/14/2021 Initial Diagnosis   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HCC)   11/25/2021 Cancer Staging   Staging form: Oral Cavity, AJCC 8th Edition - Clinical stage from 11/25/2021: Stage IVC (cT4a, cN1, pM1) - Signed by Nathaniel Massed, MD on 02/04/2022 Histopathologic type: Squamous cell carcinoma, NOS Stage prefix: Initial diagnosis Histologic grade (G): G2 Histologic grading system: 3 grade system   03/10/2022 -  Chemotherapy   Patient is on Treatment Plan : HEAD/NECK Pembrolizumab (200) q21d        INTERVAL HISTORY:   Nathaniel Hicks is a 82 y.o. male presenting to clinic today for follow up of squamous cell carcinoma of the floor of the mouth and testicular seminoma. He was last seen by me on 08/04/22.  Today, he states that he is doing well overall. His appetite level is at 70%. His energy level is at 25%.  He c/o constant tiredness and low energy. He is still able to do daily activities. He notes his breathing is better. He is still taking amlodipine 1x a day. He hasn't taken hydrocodone for 1 week.   He c/o a burning sensation and soreness in the mouth that is improved since his last visit. He is out of his liquid pain medication. He usually takes his liquid pain medication 2x a day, once in the morning and once in the evening. His wife said he was unable to get a refill at the pharmacy, as they were told they had a 3 month supply.   He starts XRT on 08/27/22.   PAST MEDICAL HISTORY:   Past Medical History: Past Medical History:  Diagnosis Date   Arthritis    BPH (benign prostatic hyperplasia)    Difficult intubation    HOH (hard of hearing)    Hyperlipidemia 11/02/2017   Hypertension    Hypertension 11/02/2017    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HCC)    2014   Vitamin D deficiency 11/02/2017    Surgical History: Past Surgical History:  Procedure Laterality Date   COLONOSCOPY N/A 11/24/2012   Procedure: COLONOSCOPY;  Surgeon: Ok Anis  Cline Crock, MD;  Location: [facility];  Service: Endoscopy;  Laterality: N/A;  830-moved to 730 Ann notified pt   FLOOR OF MOUTH BIOPSY N/A 10/14/2021   Procedure: FLOOR OF MOUTH RESECTION;  Surgeon: Christia Reading, MD;  Location: [facility] OR;  Service: ENT;  Laterality: N/A;   HEMORROIDECTOMY     KNEE ARTHROSCOPY WITH LATERAL MENISECTOMY Right 08/31/2017   Procedure: KNEE ARTHROSCOPY WITH LATERAL MENISECTOMY;  Surgeon: Vickki Hearing, MD;  Location: [facility]S;  Service: Orthopedics;  Laterality: Right;   LESION EXCISION N/A 03/23/2012   Procedure: EXCISION NEOPLASM SCALP ;  Surgeon: Dalia Heading, MD;  Location: [facility];  Service: General;  Laterality: N/A;  Excision of Scalp Neoplasm   ORCHIECTOMY Right 12/20/2012   Procedure: RIGHT RADICAL ORCHIECTOMY/POSSIBLE BX RIGHT TESTICLE;  Surgeon: Ky Barban, MD;  Location: [facility];  Service: Urology;  Laterality: Right;   PORTACATH PLACEMENT Left 03/25/2022   Procedure: INSERTION PORT-A-CATH;  Surgeon: Franky Macho, MD;  Location: [facility];  Service: General;  Laterality: Left;   PROSTATE SURGERY     RADICAL NECK DISSECTION Bilateral 10/14/2021   Procedure: NECK DISSECTION;  Surgeon: Christia Reading, MD;  Location: [facility] OR;  Service: ENT;  Laterality: Bilateral;   SCALP LACERATION REPAIR     [facility]-Dr Katrinka Blazing   SKIN FULL THICKNESS GRAFT Bilateral 10/14/2021   Procedure: PLATYSMA FLAP CLOSURE;  Surgeon: Christia Reading, MD;  Location: [facility] OR;  Service: ENT;  Laterality: Bilateral;   TOOTH EXTRACTION  10/14/2021   Procedure: DENTAL EXTRACTIONS;  Surgeon: Christia Reading, MD;  Location: [facility] OR;  Service: ENT;;   TRACHEOSTOMY TUBE PLACEMENT N/A 10/14/2021   Procedure: TRACHEOSTOMY;  Surgeon: Christia Reading, MD;  Location: [facility] OR;  Service: ENT;  Laterality: N/A;     Social History: Social History   Socioeconomic History   Marital status: Single    Spouse name: Not on file   Number of children: Not on file   Years of education: Not on file   Highest education level: Not on file  Occupational History   Not on file  Tobacco Use   Smoking status: Former    Current packs/day: 0.25    Average packs/day: 0.3 packs/day for 50.0 years (12.5 ttl pk-yrs)    Types: Cigarettes   Smokeless tobacco: Never  Vaping Use   Vaping status: Never Used  Substance and Sexual Activity   Alcohol use: Not Currently   Drug use: No   Sexual activity: Yes    Birth control/protection: None  Other Topics Concern   Not on file  Social History Narrative   Not on file   Social Determinants of Health   Financial Resource Strain: Not on file  Food Insecurity: Not on file  Transportation Needs: Not on file  Physical Activity: Not on file  Stress: Not on file  Social Connections: Not on file  Intimate Partner Violence: Not on file    Family History: Family History  Problem Relation Age of Onset   Cancer Brother    Cancer Brother     Current Medications:  Current Outpatient Medications:    acetaminophen (TYLENOL) 650 MG CR tablet, Take 650 mg by mouth every 8 (eight) hours as needed for pain., Disp: , Rfl:    budesonide-formoterol (SYMBICORT) 80-4.5 MCG/ACT inhaler, Inhale 2 puffs into the lungs in the morning and at bedtime., Disp: 1 each, Rfl: 12   Cholecalciferol (VITAMIN D3) 50 MCG (2000 UT) TABS, Take 2,000 Units by mouth daily., Disp: , Rfl:  HYDROcodone-acetaminophen (HYCET) 7.5-325 mg/15 ml solution, Take 15 mLs by mouth every 6 (six) hours as needed for moderate pain., Disp: 473 mL, Rfl: 0   lidocaine (XYLOCAINE) 2 % solution, Take by mouth., Disp: , Rfl:    magnesium oxide (MAG-OX) 400 MG tablet, Take 400 mg by mouth daily., Disp: , Rfl:    Menthol, Topical Analgesic, (BIOFREEZE EX), Apply 1 application  topically daily as needed (pain)., Disp:  , Rfl:    naloxone (NARCAN) nasal spray 4 mg/0.1 mL, SMARTSIG:Both Nares, Disp: , Rfl:    PEMBROLIZUMAB IV, Inject into the vein every 21 ( twenty-one) days., Disp: , Rfl:    Polyethyl Glycol-Propyl Glycol (GOODSENSE LUBRICANT EYE DROPS OP), Place 1 drop into both eyes daily as needed (dry eyes)., Disp: , Rfl:    pravastatin (PRAVACHOL) 80 MG tablet, Take 80 mg by mouth daily., Disp: , Rfl:    predniSONE (DELTASONE) 10 MG tablet, Take  4 each am x 2 days,   2 each am x 2 days,  1 each am x 2 days and stop, Disp: 14 tablet, Rfl: 0   RAPAFLO 8 MG CAPS capsule, Take 8 mg by mouth daily. Silodosin, Disp: , Rfl:    vitamin B-12 (CYANOCOBALAMIN) 500 MCG tablet, Take 500 mcg by mouth daily., Disp: , Rfl:  No current facility-administered medications for this visit.  Facility-Administered Medications Ordered in Other Visits:    heparin lock flush 100 unit/mL, 500 Units, Intracatheter, Once PRN, Nathaniel Massed, MD   sodium chloride flush (NS) 0.9 % injection 10 mL, 10 mL, Intracatheter, PRN, Nathaniel Massed, MD   sodium chloride flush (NS) 0.9 % injection 10 mL, 10 mL, Intracatheter, PRN, Nathaniel Massed, MD, 10 mL at 08/24/22 1549   Allergies: No Known Allergies  REVIEW OF SYSTEMS:   Review of Systems  Constitutional:  Positive for fatigue. Negative for chills and fever.  HENT:   Positive for trouble swallowing. Negative for lump/mass, mouth sores, nosebleeds and sore throat.        +burning sensation in the mouth +mouth soreness  Eyes:  Negative for eye problems.  Respiratory:  Negative for cough and shortness of breath.   Cardiovascular:  Negative for chest pain, leg swelling and palpitations.  Gastrointestinal:  Negative for abdominal pain, constipation, diarrhea, nausea and vomiting.  Genitourinary:  Negative for bladder incontinence, difficulty urinating, dysuria, frequency, hematuria and nocturia.   Musculoskeletal:  Negative for arthralgias, back pain, flank pain,  myalgias and neck pain.  Skin:  Negative for itching and rash.  Neurological:  Negative for dizziness, headaches and numbness.  Hematological:  Does not bruise/bleed easily.  Psychiatric/Behavioral:  Negative for depression, sleep disturbance and suicidal ideas. The patient is not nervous/anxious.   All other systems reviewed and are negative.    VITALS:   There were no vitals taken for this visit.  Wt Readings from Last 3 Encounters:  08/24/22 150 lb 12.8 oz (68.4 kg)  08/07/22 150 lb (68 kg)  08/04/22 153 lb 3.5 oz (69.5 kg)    There is no height or weight on file to calculate BMI.  Performance status (ECOG): 1 - Symptomatic but completely ambulatory  PHYSICAL EXAM:   Physical Exam Vitals and nursing note reviewed. Exam conducted with a chaperone present.  Constitutional:      Appearance: Normal appearance.  Cardiovascular:     Rate and Rhythm: Normal rate and regular rhythm.     Pulses: Normal pulses.     Heart sounds: Normal heart sounds.  Pulmonary:  Effort: Pulmonary effort is normal.     Breath sounds: Normal breath sounds.  Abdominal:     Palpations: Abdomen is soft. There is no hepatomegaly, splenomegaly or mass.     Tenderness: There is no abdominal tenderness.  Musculoskeletal:     Right lower leg: No edema.     Left lower leg: No edema.  Lymphadenopathy:     Cervical: No cervical adenopathy.     Right cervical: No superficial, deep or posterior cervical adenopathy.    Left cervical: No superficial, deep or posterior cervical adenopathy.     Upper Body:     Right upper body: No supraclavicular or axillary adenopathy.     Left upper body: No supraclavicular or axillary adenopathy.  Neurological:     General: No focal deficit present.     Mental Status: He is alert and oriented to person, place, and time.  Psychiatric:        Mood and Affect: Mood normal.        Behavior: Behavior normal.     LABS:      Latest Ref Rng & Units 08/24/2022   12:35 PM  08/04/2022   11:09 AM 07/14/2022   11:33 AM  CBC  WBC 4.0 - 10.5 K/uL 6.9  7.2  9.0   Hemoglobin 13.0 - 17.0 g/dL 9.8  9.6  65.7   Hematocrit 39.0 - 52.0 % 31.2  30.6  31.9   Platelets 150 - 400 K/uL 283  295  363       Latest Ref Rng & Units 08/24/2022   12:35 PM 08/04/2022   11:09 AM 07/14/2022   11:33 AM  CMP  Glucose 70 - 99 mg/dL 846  962  952   BUN 8 - 23 mg/dL 24  24  23    Creatinine 0.61 - 1.24 mg/dL 8.41  3.24  4.01   Sodium 135 - 145 mmol/L 135  138  136   Potassium 3.5 - 5.1 mmol/L 4.7  4.5  4.6   Chloride 98 - 111 mmol/L 101  103  101   CO2 22 - 32 mmol/L 26  24  26    Calcium 8.9 - 10.3 mg/dL 02.7  25.3  66.4   Total Protein 6.5 - 8.1 g/dL 7.1  7.7  7.6   Total Bilirubin 0.3 - 1.2 mg/dL 0.6  0.4  0.7   Alkaline Phos 38 - 126 U/L 48  53  48   AST 15 - 41 U/L 20  16  21    ALT 0 - 44 U/L 20  17  24       Lab Results  Component Value Date   CEA 2.2 01/19/2013   /  CEA  Date Value Ref Range Status  01/19/2013 2.2 0.0 - 5.0 ng/mL Final    Comment:    Performed at [facility]   No results found for: "PSA1" No results found for: "CAN199" No results found for: "CAN125"  No results found for: "TOTALPROTELP", "ALBUMINELP", "A1GS", "A2GS", "BETS", "BETA2SER", "GAMS", "MSPIKE", "SPEI" Lab Results  Component Value Date   TIBC 244 (L) 08/04/2022   TIBC 251 05/12/2022   FERRITIN 273 08/04/2022   FERRITIN 195 05/12/2022   IRONPCTSAT 19 08/04/2022   IRONPCTSAT 18 05/12/2022   Lab Results  Component Value Date   LDH 172 11/12/2020   LDH 175 10/26/2018   LDH 179 10/25/2013     STUDIES:   DG Chest 2 View  Result Date: 08/11/2022 CLINICAL DATA:  Wheezing,  cough and shortness of breath. Patient has mouth cancer. EXAM: CHEST - 2 VIEW COMPARISON:  07/30/2022 and older studies. FINDINGS: Cardiac silhouette is top-normal in size. No mediastinal or hilar masses. No evidence of adenopathy. Clear lungs.  No pleural effusion or pneumothorax. Left anterior chest wall,  subclavian, Port-A-Cath is stable. Skeletal structures are intact. IMPRESSION: No active cardiopulmonary disease. Electronically Signed   By: Amie Portland M.D.   On: 08/11/2022 07:44

## 2022-08-24 NOTE — Progress Notes (Signed)
Patient presents today for chemotherapy infusion. Patient is in satisfactory condition with no new complaints voiced.  Vital signs are stable.  Labs reviewed by Dr. Ellin Saba during the office visit and all labs are within treatment parameters.  Port tip is in the azygous vein per PET scan on 05/28/2022.  Ok to use port per Dr. Ellin Saba.  We will proceed with treatment per MD orders.   Patient tolerated treatment well with no complaints voiced.  Patient left ambulatory using cane with sister in stable condition.  Vital signs stable at discharge.  Follow up as scheduled.

## 2022-08-24 NOTE — Patient Instructions (Signed)
[facility]  Discharge Instructions: Thank you for choosing [facility] to provide your oncology and hematology care.  If you have a lab appointment with the Cancer Center - please note that after April 8th, 2024, all labs will be drawn in the cancer center.  You do not have to check in or register with the main entrance as you have in the past but will complete your check-in in the cancer center.  Wear comfortable clothing and clothing appropriate for easy access to any Portacath or PICC line.   We strive to give you quality time with your provider. You may need to reschedule your appointment if you arrive late (15 or more minutes).  Arriving late affects you and other patients whose appointments are after yours.  Also, if you miss three or more appointments without notifying the office, you may be dismissed from the clinic at the provider's discretion.      For prescription refill requests, have your pharmacy contact our office and allow 72 hours for refills to be completed.    Today you received the following chemotherapy and/or immunotherapy agents Keytruda. Pembrolizumab Injection What is this medication? PEMBROLIZUMAB (PEM broe LIZ ue mab) treats some types of cancer. It works by helping your immune system slow or stop the spread of cancer cells. It is a monoclonal antibody. This medicine may be used for other purposes; ask your health care provider or pharmacist if you have questions. COMMON BRAND NAME(S): Keytruda What should I tell my care team before I take this medication? They need to know if you have any of these conditions: Allogeneic stem cell transplant (uses someone else's stem cells) Autoimmune diseases, such as Crohn disease, ulcerative colitis, lupus History of chest radiation Nervous system problems, such as Guillain-Barre syndrome, myasthenia gravis Organ transplant An unusual or allergic reaction to pembrolizumab, other medications,  foods, dyes, or preservatives Pregnant or trying to get pregnant Breast-feeding How should I use this medication? This medication is injected into a vein. It is given by your care team in a hospital or clinic setting. A special MedGuide will be given to you before each treatment. Be sure to read this information carefully each time. Talk to your care team about the use of this medication in children. While it may be prescribed for children as young as 6 months for selected conditions, precautions do apply. Overdosage: If you think you have taken too much of this medicine contact a poison control center or emergency room at once. NOTE: This medicine is only for you. Do not share this medicine with others. What if I miss a dose? Keep appointments for follow-up doses. It is important not to miss your dose. Call your care team if you are unable to keep an appointment. What may interact with this medication? Interactions have not been studied. This list may not describe all possible interactions. Give your health care provider a list of all the medicines, herbs, non-prescription drugs, or dietary supplements you use. Also tell them if you smoke, drink alcohol, or use illegal drugs. Some items may interact with your medicine. What should I watch for while using this medication? Your condition will be monitored carefully while you are receiving this medication. You may need blood work while taking this medication. This medication may cause serious skin reactions. They can happen weeks to months after starting the medication. Contact your care team right away if you notice fevers or flu-like symptoms with a rash. The rash   may be red or purple and then turn into blisters or peeling of the skin. You may also notice a red rash with swelling of the face, lips, or lymph nodes in your neck or under your arms. Tell your care team right away if you have any change in your eyesight. Talk to your care team if you  may be pregnant. Serious birth defects can occur if you take this medication during pregnancy and for 4 months after the last dose. You will need a negative pregnancy test before starting this medication. Contraception is recommended while taking this medication and for 4 months after the last dose. Your care team can help you find the option that works for you. Do not breastfeed while taking this medication and for 4 months after the last dose. What side effects may I notice from receiving this medication? Side effects that you should report to your care team as soon as possible: Allergic reactions--skin rash, itching, hives, swelling of the face, lips, tongue, or throat Dry cough, shortness of breath or trouble breathing Eye pain, redness, irritation, or discharge with blurry or decreased vision Heart muscle inflammation--unusual weakness or fatigue, shortness of breath, chest pain, fast or irregular heartbeat, dizziness, swelling of the ankles, feet, or hands Hormone gland problems--headache, sensitivity to light, unusual weakness or fatigue, dizziness, fast or irregular heartbeat, increased sensitivity to cold or heat, excessive sweating, constipation, hair loss, increased thirst or amount of urine, tremors or shaking, irritability Infusion reactions--chest pain, shortness of breath or trouble breathing, feeling faint or lightheaded Kidney injury (glomerulonephritis)--decrease in the amount of urine, red or dark brown urine, foamy or bubbly urine, swelling of the ankles, hands, or feet Liver injury--right upper belly pain, loss of appetite, nausea, light-colored stool, dark yellow or brown urine, yellowing skin or eyes, unusual weakness or fatigue Pain, tingling, or numbness in the hands or feet, muscle weakness, change in vision, confusion or trouble speaking, loss of balance or coordination, trouble walking, seizures Rash, fever, and swollen lymph nodes Redness, blistering, peeling, or loosening  of the skin, including inside the mouth Sudden or severe stomach pain, bloody diarrhea, fever, nausea, vomiting Side effects that usually do not require medical attention (report to your care team if they continue or are bothersome): Bone, joint, or muscle pain Diarrhea Fatigue Loss of appetite Nausea Skin rash This list may not describe all possible side effects. Call your doctor for medical advice about side effects. You may report side effects to FDA at 1-800-FDA-1088. Where should I keep my medication? This medication is given in a hospital or clinic. It will not be stored at home. NOTE: This sheet is a summary. It may not cover all possible information. If you have questions about this medicine, talk to your doctor, pharmacist, or health care provider.  2024 Elsevier/Gold Standard (2021-05-13 00:00:00)       To help prevent nausea and vomiting after your treatment, we encourage you to take your nausea medication as directed.  BELOW ARE SYMPTOMS THAT SHOULD BE REPORTED IMMEDIATELY: *FEVER GREATER THAN 100.4 F (38 C) OR HIGHER *CHILLS OR SWEATING *NAUSEA AND VOMITING THAT IS NOT CONTROLLED WITH YOUR NAUSEA MEDICATION *UNUSUAL SHORTNESS OF BREATH *UNUSUAL BRUISING OR BLEEDING *URINARY PROBLEMS (pain or burning when urinating, or frequent urination) *BOWEL PROBLEMS (unusual diarrhea, constipation, pain near the anus) TENDERNESS IN MOUTH AND THROAT WITH OR WITHOUT PRESENCE OF ULCERS (sore throat, sores in mouth, or a toothache) UNUSUAL RASH, SWELLING OR PAIN  UNUSUAL VAGINAL DISCHARGE OR ITCHING     Items with * indicate a potential emergency and should be followed up as soon as possible or go to the Emergency Department if any problems should occur.  Please show the CHEMOTHERAPY ALERT CARD or IMMUNOTHERAPY ALERT CARD at check-in to the Emergency Department and triage nurse.  Should you have questions after your visit or need to cancel or reschedule your appointment, please contact  [facility] [phone]  and follow the prompts.  Office hours are 8:00 a.m. to 4:30 p.m. Monday - Friday. Please note that voicemails left after 4:00 p.m. may not be returned until the following business day.  We are closed weekends and major holidays. You have access to a nurse at all times for urgent questions. Please call the main number to the clinic [phone] and follow the prompts.  For any non-urgent questions, you may also contact your provider using MyChart. We now offer e-Visits for anyone 18 and older to request care online for non-urgent symptoms. For details visit [email or web address].   Also download the MyChart app! Go to the app store, search "MyChart", open the app, select [facility], and log in with your MyChart username and password.   

## 2022-08-24 NOTE — Progress Notes (Signed)
Patient has been examined by Dr. Katragadda. Vital signs and labs have been reviewed by MD - ANC, Creatinine, LFTs, hemoglobin, and platelets are within treatment parameters per M.D. - pt may proceed with treatment.  Primary RN and pharmacy notified.  

## 2022-08-25 ENCOUNTER — Other Ambulatory Visit: Payer: Self-pay

## 2022-08-25 ENCOUNTER — Inpatient Hospital Stay: Payer: 59

## 2022-08-27 DIAGNOSIS — C787 Secondary malignant neoplasm of liver and intrahepatic bile duct: Secondary | ICD-10-CM | POA: Diagnosis not present

## 2022-08-27 DIAGNOSIS — C049 Malignant neoplasm of floor of mouth, unspecified: Secondary | ICD-10-CM | POA: Diagnosis not present

## 2022-08-27 DIAGNOSIS — R062 Wheezing: Secondary | ICD-10-CM | POA: Diagnosis not present

## 2022-08-27 DIAGNOSIS — C048 Malignant neoplasm of overlapping sites of floor of mouth: Secondary | ICD-10-CM | POA: Diagnosis not present

## 2022-08-27 DIAGNOSIS — Z510 Encounter for antineoplastic radiation therapy: Secondary | ICD-10-CM | POA: Diagnosis not present

## 2022-08-27 DIAGNOSIS — R0600 Dyspnea, unspecified: Secondary | ICD-10-CM | POA: Diagnosis not present

## 2022-08-28 ENCOUNTER — Encounter: Payer: Self-pay | Admitting: Hematology

## 2022-08-28 DIAGNOSIS — C048 Malignant neoplasm of overlapping sites of floor of mouth: Secondary | ICD-10-CM | POA: Diagnosis not present

## 2022-08-28 DIAGNOSIS — C787 Secondary malignant neoplasm of liver and intrahepatic bile duct: Secondary | ICD-10-CM | POA: Diagnosis not present

## 2022-08-28 DIAGNOSIS — C049 Malignant neoplasm of floor of mouth, unspecified: Secondary | ICD-10-CM | POA: Diagnosis not present

## 2022-08-28 DIAGNOSIS — Z510 Encounter for antineoplastic radiation therapy: Secondary | ICD-10-CM | POA: Diagnosis not present

## 2022-08-28 DIAGNOSIS — R062 Wheezing: Secondary | ICD-10-CM | POA: Diagnosis not present

## 2022-08-28 DIAGNOSIS — R0600 Dyspnea, unspecified: Secondary | ICD-10-CM | POA: Diagnosis not present

## 2022-08-28 IMAGING — DX DG CHEST 1V PORT
1 series · 1 of 1 positions shown · non-contrast
Comparison: Chest x-ray 02/21/2014

CLINICAL DATA: Cough

EXAM:
PORTABLE CHEST 1 VIEW

[chest ap]
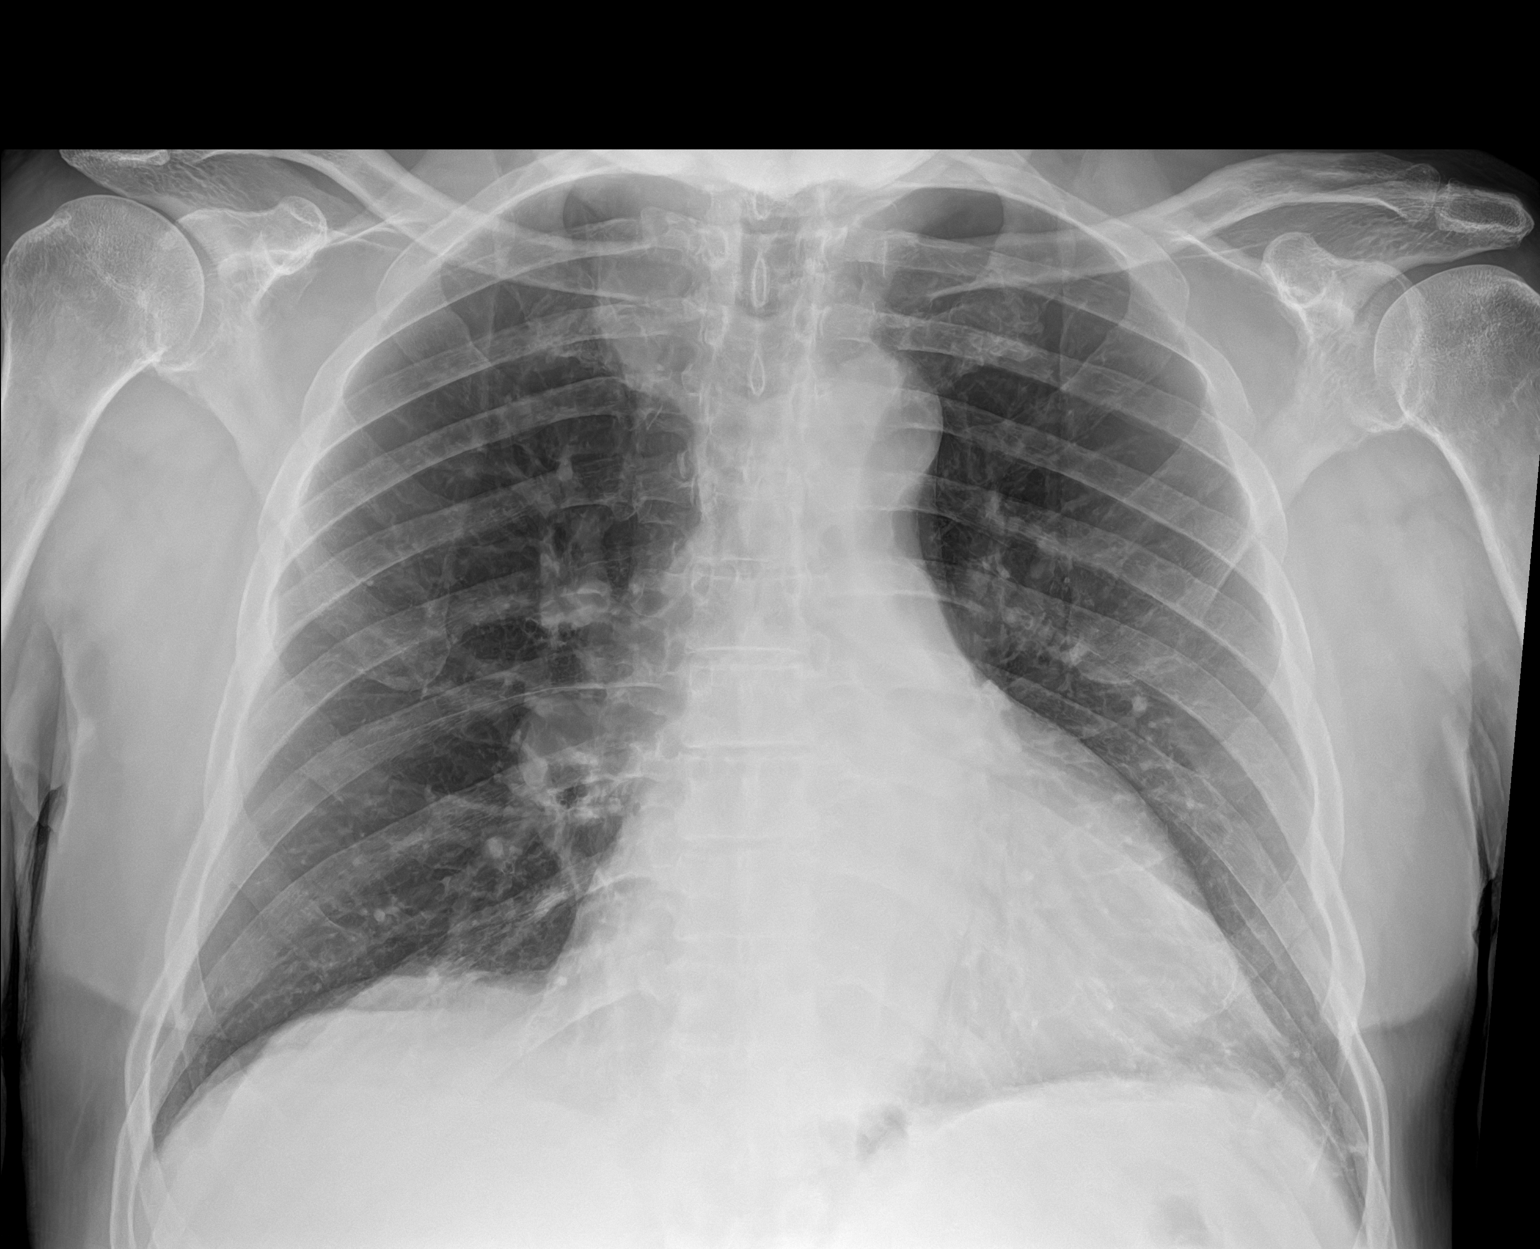

[1 of 1 positions shown; findings below may reference images not displayed]

FINDINGS: Heart is enlarged. Mediastinum appears stable. Pulmonary vasculature
within normal limits. No focal consolidation identified. Minor
likely subsegmental atelectasis or scarring at the left lung base.
IMPRESSION: Cardiomegaly with no acute process identified.

## 2022-09-06 DIAGNOSIS — Z930 Tracheostomy status: Secondary | ICD-10-CM | POA: Diagnosis not present

## 2022-09-08 DIAGNOSIS — Z930 Tracheostomy status: Secondary | ICD-10-CM | POA: Diagnosis not present

## 2022-09-10 ENCOUNTER — Ambulatory Visit (HOSPITAL_COMMUNITY)
Admission: RE | Admit: 2022-09-10 | Discharge: 2022-09-10 | Disposition: A | Discharge status: Home / Self Care | Payer: 59 | Source: Ambulatory Visit | Attending: Hematology | Admitting: Hematology

## 2022-09-10 DIAGNOSIS — C787 Secondary malignant neoplasm of liver and intrahepatic bile duct: Secondary | ICD-10-CM | POA: Diagnosis not present

## 2022-09-10 DIAGNOSIS — C049 Malignant neoplasm of floor of mouth, unspecified: Secondary | ICD-10-CM | POA: Insufficient documentation

## 2022-09-10 MED ORDER — FLUDEOXYGLUCOSE F - 18 (FDG) INJECTION
7.5900 | Freq: Once | INTRAVENOUS | Status: AC | PRN
  Administered 2022-09-10: 7.59 via INTRAVENOUS

## 2022-09-15 ENCOUNTER — Inpatient Hospital Stay (HOSPITAL_BASED_OUTPATIENT_CLINIC_OR_DEPARTMENT_OTHER): Payer: 59 | Admitting: Hematology

## 2022-09-15 ENCOUNTER — Inpatient Hospital Stay: Payer: 59 | Attending: Hematology

## 2022-09-15 ENCOUNTER — Encounter: Payer: Self-pay | Admitting: Hematology

## 2022-09-15 ENCOUNTER — Inpatient Hospital Stay: Payer: 59

## 2022-09-15 DIAGNOSIS — C770 Secondary and unspecified malignant neoplasm of lymph nodes of head, face and neck: Secondary | ICD-10-CM | POA: Diagnosis not present

## 2022-09-15 DIAGNOSIS — Z87891 Personal history of nicotine dependence: Secondary | ICD-10-CM | POA: Insufficient documentation

## 2022-09-15 DIAGNOSIS — Z79620 Long term (current) use of immunosuppressive biologic: Secondary | ICD-10-CM | POA: Insufficient documentation

## 2022-09-15 DIAGNOSIS — Z809 Family history of malignant neoplasm, unspecified: Secondary | ICD-10-CM | POA: Diagnosis not present

## 2022-09-15 DIAGNOSIS — C049 Malignant neoplasm of floor of mouth, unspecified: Secondary | ICD-10-CM

## 2022-09-15 DIAGNOSIS — K1379 Other lesions of oral mucosa: Secondary | ICD-10-CM | POA: Insufficient documentation

## 2022-09-15 DIAGNOSIS — C787 Secondary malignant neoplasm of liver and intrahepatic bile duct: Secondary | ICD-10-CM | POA: Diagnosis not present

## 2022-09-15 DIAGNOSIS — Z930 Tracheostomy status: Secondary | ICD-10-CM | POA: Diagnosis not present

## 2022-09-15 DIAGNOSIS — I959 Hypotension, unspecified: Secondary | ICD-10-CM | POA: Diagnosis not present

## 2022-09-15 DIAGNOSIS — Z8547 Personal history of malignant neoplasm of testis: Secondary | ICD-10-CM | POA: Insufficient documentation

## 2022-09-15 DIAGNOSIS — Z5111 Encounter for antineoplastic chemotherapy: Secondary | ICD-10-CM | POA: Insufficient documentation

## 2022-09-15 DIAGNOSIS — D630 Anemia in neoplastic disease: Secondary | ICD-10-CM | POA: Insufficient documentation

## 2022-09-15 LAB — CBC WITH DIFFERENTIAL/PLATELET
Abs Immature Granulocytes: 0.03 10*3/uL (ref 0.00–0.07)
Basophils Absolute: 0.1 10*3/uL (ref 0.0–0.1)
Basophils Relative: 1 %
Eosinophils Absolute: 0.3 10*3/uL (ref 0.0–0.5)
Eosinophils Relative: 4 %
HCT: 30.1 % — ABNORMAL LOW (ref 39.0–52.0)
Hemoglobin: 9.4 g/dL — ABNORMAL LOW (ref 13.0–17.0)
Immature Granulocytes: 0 %
Lymphocytes Relative: 15 %
Lymphs Abs: 1.3 10*3/uL (ref 0.7–4.0)
MCH: 32.1 pg (ref 26.0–34.0)
MCHC: 31.2 g/dL (ref 30.0–36.0)
MCV: 102.7 fL — ABNORMAL HIGH (ref 80.0–100.0)
Monocytes Absolute: 0.9 10*3/uL (ref 0.1–1.0)
Monocytes Relative: 10 %
Neutro Abs: 5.8 10*3/uL (ref 1.7–7.7)
Neutrophils Relative %: 70 %
Platelets: 356 10*3/uL (ref 150–400)
RBC: 2.93 MIL/uL — ABNORMAL LOW (ref 4.22–5.81)
RDW: 13.3 % (ref 11.5–15.5)
WBC: 8.4 10*3/uL (ref 4.0–10.5)
nRBC: 0 % (ref 0.0–0.2)

## 2022-09-15 LAB — COMPREHENSIVE METABOLIC PANEL
ALT: 15 U/L (ref 0–44)
AST: 15 U/L (ref 15–41)
Albumin: 3.2 g/dL — ABNORMAL LOW (ref 3.5–5.0)
Alkaline Phosphatase: 50 U/L (ref 38–126)
Anion gap: 6 (ref 5–15)
BUN: 27 mg/dL — ABNORMAL HIGH (ref 8–23)
CO2: 26 mmol/L (ref 22–32)
Calcium: 9.8 mg/dL (ref 8.9–10.3)
Chloride: 103 mmol/L (ref 98–111)
Creatinine, Ser: 1.33 mg/dL — ABNORMAL HIGH (ref 0.61–1.24)
GFR, Estimated: 54 mL/min — ABNORMAL LOW (ref >60)
Glucose, Bld: 134 mg/dL — ABNORMAL HIGH (ref 70–99)
Potassium: 4.3 mmol/L (ref 3.5–5.1)
Sodium: 135 mmol/L (ref 135–145)
Total Bilirubin: 0.5 mg/dL (ref 0.3–1.2)
Total Protein: 7.2 g/dL (ref 6.5–8.1)

## 2022-09-15 LAB — TSH: TSH: 2.117 u[IU]/mL (ref 0.350–4.500)

## 2022-09-15 LAB — MAGNESIUM: Magnesium: 2.2 mg/dL (ref 1.7–2.4)

## 2022-09-15 MED ORDER — SODIUM CHLORIDE 0.9% FLUSH
10.0000 mL | INTRAVENOUS | Status: AC
  Administered 2022-09-15: 10 mL

## 2022-09-15 MED ORDER — HYDROCODONE-ACETAMINOPHEN 7.5-325 MG/15ML PO SOLN: 15.0000 mL | Freq: Four times a day (QID) | ORAL | 0 refills | Status: DC | PRN

## 2022-09-15 NOTE — Progress Notes (Signed)
[facility] [address] [place] [address]    Clinic Day:  09/15/22   Referring physician: [name], MD  Patient Care Team: [name], MD as PCP - General (Internal Medicine) [name], MD as Consulting Physician (Urology) [name], MD as Medical Oncologist (Medical Oncology) [name], RN as Oncology Nurse Navigator (Medical Oncology)   ASSESSMENT & PLAN:   Assessment: 1.  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  Enlarged contralateral right submandibular node.  Enlargement of the left submandibular gland probably reactive.  No abscess. - Biopsy (07/18/2021) floor of the mouth: Invasive well to moderately differentiated keratinizing squamous cell carcinoma.  Tumor cells negative for p16. - 10/14/2021: Floor of the mouth resection, tracheostomy, bilateral selective neck dissections zones 1-3 by Dr. [name] - Pathology: Invasive moderately differentiated keratinizing SCC, 4.1 cm, carcinoma invades for a depth of about 1.9 cm and involves saliva gland tissue.  Anterior, posterior, right, left resection margins are involved.  Deep resection margin is negative.  Metastatic carcinoma 1/3 level 1 lymph nodes.  3 level 2 and 3 level 3 lymph nodes negative for carcinoma.  0/10 lymph nodes involved in the left side neck zone 1, 2, 3 dissection.  ENE not identified.  LVI/perineural invasion not identified. - I have talked to pathologist.  Reexcision margins were considered negative.  He has a high risk feature which is T4 tumor. -  PET scan on 12/12/2021: Soft tissue thickening and calcification along the ventral aspect of the trachea with associated hypermetabolism corresponding to recent tracheostomy site.  7 mm right  paratracheal lymph node new with SUV 2.4.  Hypermetabolism along the dome of the right hepatic lobe with probable 2.4 cm low-attenuation lesion - Liver lesion biopsy (01/28/2022): Metastatic squamous cell carcinoma, keratinizing. - NGS testing: PD-L1 (22 C3): CPS: 100, CD274 (PD-L1) amplified, JAK2 amplified, PIK3CA pathogenic variant exon 10, T p53 pathogenic variant, MS-stable, TMB-low - Per Dr. [name], he also has local recurrence of disease in the floor of the mouth. - Keytruda from 03/10/2022 through 08/24/2022 with progression - XRT to the floor of the mouth on 06/29/2022   2.  Social/family history: - He lives in his apartment by himself and is independent of ADLs and IADLs.  He does not drive.  He worked as a Curator and several other jobs.  Quit smoking 1 month ago.  He smoked 1 pack/week for more than 50 years. - 1 brother had agent orange related cancer.  Another brother also had cancer, type unknown to the patient.   3.  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 - He was on close surveillance rather than adjuvant chemotherapy.    Plan: 1.  Stage IV (PT4PN1 M1) SCC of the floor of the mouth, p16 negative: - He does not report any immunotherapy related side effects. - Labs from today: Creatinine 1.33 and stable.  LFTs are normal.  TSH is 2.1. - PET scan from 09/10/2022: Anterior floor of the mouth lesion 2.4 x 3.3 cm more or less stable.  Liver lesion measures 3.1 x 2.9 cm, previously 2.6 x 1.7 cm.  Right cervical lymph node metastasis, mildly progressive. - Based on the findings on the PET scan, I have recommended discontinuing  Keytruda. - We discussed options including chemotherapy with cetuximab, single agent cetuximab and best supportive care in the form of hospice. - We have discussed side effects of each drug in detail.  We have given literature about it. - He will discuss with his sisters and give [place] a call back if he wants to pursue active  treatment. - I have also recommended HER2 by IHC testing on the biopsy of the liver from January 2024.    2.  Oral pain: - Continue liquid hydrocodone as needed.  I have sent refill today.   3.  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  Hemoglobin is 9.4 with MCV 102 and stable.  4.  Hypotension: - Blood pressure is 91/53.  Blood pressure medications on hold.    No orders of the defined types were placed in this encounter.     [name],acting as a Neurosurgeon for [name], MD.,have documented all relevant documentation on the behalf of [name], MD,as directed by  [name], MD while in the presence of [name], MD.  I, [name] MD, have reviewed the above documentation for accuracy and completeness, and I agree with the above.      [name], MD   9/3/20245:06 PM  CHIEF COMPLAINT:   Diagnosis: squamous cell carcinoma of the floor of the mouth and testicular seminoma    Cancer Staging  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HCC) Staging form: Oral Cavity, AJCC 8th Edition - Clinical stage from 11/25/2021: Stage IVC (cT4a, cN1, pM1) - Signed by [name], MD on 02/04/2022  Testicle cancer, right Staging form: Testis, AJCC 7th Edition - Clinical: Stage I (T2, N0, M0) - Signed by [name], PA-C on 07/23/2013    Prior Therapy: none  Current Therapy:  Pembrolizumab    HISTORY OF PRESENT ILLNESS:   Oncology History  Testicle cancer, right  12/20/2012 Surgery   Right total orchiectomyt- 1. Testis, biopsy, right - SEMINOMA. PLEASE SEE COMMENT. 2. Testis, tumor, right - SEMINOMA, 7.5 CM. - ANGIOLYMPHATIC INVASION PRESENT. - RESECTION MARGINS, NEGATIVE FOR ATYPIA OR MALIGNANCY.   01/18/2013 PET scan   Status post right orchiectomy. No findings specific for metastatic disease. Small para-aortic nodes measuring up to 5 mm short axis, without convincing hypermetabolism. Given  location, attention on follow-up is suggested.   04/17/2013 Imaging   CT CAP- No evidence of metastatic disease in the chest, abdomen or pelvis. Proximal LAD coronary artery calcification.   07/20/2013 Imaging   CT abd/pelvis- No evidence of metastatic disease in the abdomen or pelvis.   10/24/2013 Imaging   CT abd/pelvis- No findings to suggest metastatic disease in the abdomen or pelvis   10/24/2013 Imaging   Chest xray- Probable COPD.  Negative for metastatic disease.   02/21/2014 Imaging   CT abd/pelvis- Stable abdominal pelvic CT status post right orchectomy. No evidence of adenopathy or other metastatic disease.   02/21/2014 Imaging   Chest xray- Left lower lobe mild atelectasis and/or infiltrate.     09/10/2014 Imaging   CT abd/pelvis- Status post right orchiectomy.   No evidence of metastatic disease.   3 mm nonobstructing right upper pole renal calculus. No hydronephrosis.   03/14/2015 Imaging   CT abd/pelvis- Stable exam. No evidence of metastatic disease or other acute findings within the abdomen or pelvis.   09/13/2015 Imaging   CT abd/pelvis- No acute findings and no evidence for mass or adenopathy.    04/24/2016 Imaging   CT abd/pelvis: IMPRESSION:  1. Stable exam. No new or progressive findings. No features to suggest metastatic disease.   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HCC)  10/14/2021 Initial Diagnosis   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HCC)   11/25/2021 Cancer Staging   Staging form: Oral Cavity, AJCC 8th Edition - Clinical stage from 11/25/2021: Stage IVC (cT4a, cN1, pM1) - Signed by [name], MD on 02/04/2022 Histopathologic type: Squamous cell carcinoma, NOS Stage prefix: Initial diagnosis Histologic grade (G): G2 Histologic grading system: 3 grade system   03/10/2022 - 08/24/2022 Chemotherapy   Patient is on Treatment Plan : HEAD/NECK Pembrolizumab (200) q21d        INTERVAL HISTORY:   Nathaniel Hicks is a 82 y.o. male presenting to clinic today for follow up of  squamous cell carcinoma of the floor of the mouth and testicular seminoma. He was last seen by me on 08/24/22.  Since his last visit, he underwent restaging PET on 09/10/22 that found: a 3.3 cm mass along the anterior floor of the mouth, corresponding to the patient's known or pharyngeal cancer, grossly unchanged; right cervical nodal metastases, mildly progressive; and a 3.1 cm right hepatic metastasis, favored to be mildly progressive.  Today, he states that he is doing well overall. His appetite level is at 0%. His energy level is at 0%. He is accompanied by family.   He finished XRT on 09/04/22. He has decreased taste and appetite due to XRT. He did not receive radiation this week. He c/o generalized weakness. His family notes he needs a refill on his liquid pain medication.   He c/o left neck pain that radiates to the left upper shoulder and reports when he is sitting with no pressure to the area, his pain resolves.   He reports having 3-4 Ensure a day.   PAST MEDICAL HISTORY:   Past Medical History: Past Medical History:  Diagnosis Date   Arthritis    BPH (benign prostatic hyperplasia)    Difficult intubation    HOH (hard of hearing)    Hyperlipidemia 11/02/2017   Hypertension    Hypertension 11/02/2017   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HCC)    2014   Vitamin D deficiency 11/02/2017    Surgical History: Past Surgical History:  Procedure Laterality Date   COLONOSCOPY N/A 11/24/2012   Procedure: COLONOSCOPY;  Surgeon: Malissa Hippo, MD;  Location: [facility];  Service: Endoscopy;  Laterality: N/A;  830-moved to 730 Ann notified pt   FLOOR OF MOUTH BIOPSY N/A 10/14/2021   Procedure: FLOOR OF MOUTH RESECTION;  Surgeon: Christia Reading, MD;  Location: [facility] OR;  Service: ENT;  Laterality: N/A;   HEMORROIDECTOMY     KNEE ARTHROSCOPY WITH LATERAL MENISECTOMY Right 08/31/2017   Procedure: KNEE ARTHROSCOPY WITH LATERAL MENISECTOMY;  Surgeon: Vickki Hearing, MD;  Location: [facility]S;  Service:  Orthopedics;  Laterality: Right;   LESION EXCISION N/A 03/23/2012   Procedure: EXCISION NEOPLASM SCALP ;  Surgeon: Dalia Heading, MD;  Location: [facility];  Service: General;  Laterality: N/A;  Excision of Scalp Neoplasm   ORCHIECTOMY Right 12/20/2012   Procedure: RIGHT RADICAL ORCHIECTOMY/POSSIBLE BX RIGHT TESTICLE;  Surgeon: Ky Barban, MD;  Location: [facility];  Service: Urology;  Laterality: Right;   PORTACATH PLACEMENT Left 03/25/2022   Procedure: INSERTION PORT-A-CATH;  Surgeon: Franky Macho, MD;  Location: [facility];  Service: General;  Laterality: Left;   PROSTATE SURGERY     RADICAL NECK DISSECTION Bilateral 10/14/2021   Procedure: NECK DISSECTION;  Surgeon: Christia Reading, MD;  Location: [facility] OR;  Service: ENT;  Laterality: Bilateral;   SCALP LACERATION REPAIR     [facility]-Dr Katrinka Blazing   SKIN FULL THICKNESS GRAFT Bilateral 10/14/2021   Procedure: PLATYSMA FLAP CLOSURE;  Surgeon: Christia Reading, MD;  Location: [facility] OR;  Service: ENT;  Laterality: Bilateral;   TOOTH EXTRACTION  10/14/2021   Procedure: DENTAL EXTRACTIONS;  Surgeon: Christia Reading, MD;  Location: [facility] OR;  Service: ENT;;   TRACHEOSTOMY TUBE PLACEMENT N/A 10/14/2021   Procedure: TRACHEOSTOMY;  Surgeon: Christia Reading, MD;  Location: [facility] OR;  Service: ENT;  Laterality: N/A;    Social History: Social History   Socioeconomic History   Marital status: Single    Spouse name: Not on file   Number of children: Not on file   Years of education: Not on file   Highest education level: Not on file  Occupational History   Not on file  Tobacco Use   Smoking status: Former    Current packs/day: 0.25    Average packs/day: 0.3 packs/day for 50.0 years (12.5 ttl pk-yrs)    Types: Cigarettes   Smokeless tobacco: Never  Vaping Use   Vaping status: Never Used  Substance and Sexual Activity   Alcohol use: Not Currently   Drug use: No   Sexual activity: Yes    Birth control/protection: None  Other Topics Concern   Not on file  Social History  Narrative   Not on file   Social Determinants of Health   Financial Resource Strain: Not on file  Food Insecurity: Not on file  Transportation Needs: Not on file  Physical Activity: Not on file  Stress: Not on file  Social Connections: Not on file  Intimate Partner Violence: Not on file    Family History: Family History  Problem Relation Age of Onset   Cancer Brother    Cancer Brother     Current Medications:  Current Outpatient Medications:    acetaminophen (TYLENOL) 650 MG CR tablet, Take 650 mg by mouth every 8 (eight) hours as needed for pain., Disp: , Rfl:    budesonide-formoterol (SYMBICORT) 80-4.5 MCG/ACT inhaler, Inhale 2 puffs into the lungs in the morning and at bedtime., Disp: 1 each, Rfl: 12   Cholecalciferol (VITAMIN D3) 50 MCG (2000 UT) TABS, Take 2,000 Units by mouth daily., Disp: , Rfl:    lidocaine (XYLOCAINE) 2 % solution, Take by mouth., Disp: , Rfl:    magnesium oxide (MAG-OX) 400 MG tablet, Take 400 mg by mouth daily., Disp: , Rfl:    Menthol, Topical Analgesic, (BIOFREEZE EX), Apply 1 application  topically daily as needed (pain)., Disp: , Rfl:    naloxone (NARCAN) nasal spray 4 mg/0.1 mL, SMARTSIG:Both Nares, Disp: , Rfl:    PEMBROLIZUMAB IV, Inject into the vein every 21 ( twenty-one) days., Disp: , Rfl:    Polyethyl Glycol-Propyl Glycol (GOODSENSE LUBRICANT EYE DROPS OP), Place 1 drop into both eyes daily as needed (dry eyes)., Disp: , Rfl:    pravastatin (PRAVACHOL) 80 MG tablet, Take 80 mg by mouth daily., Disp: , Rfl:    predniSONE (DELTASONE) 10 MG tablet, Take  4 each am x 2 days,   2 each am x 2 days,  1 each am x 2 days and stop, Disp: 14 tablet, Rfl: 0   RAPAFLO 8 MG CAPS capsule, Take 8 mg by mouth daily. Silodosin, Disp: , Rfl:    vitamin B-12 (CYANOCOBALAMIN) 500 MCG tablet, Take 500 mcg by mouth daily., Disp: , Rfl:    HYDROcodone-acetaminophen (  HYCET) 7.5-325 mg/15 ml solution, Take 15 mLs by mouth every 6 (six) hours as needed for moderate  pain., Disp: 473 mL, Rfl: 0   Allergies: No Known Allergies  REVIEW OF SYSTEMS:   Review of Systems  Constitutional:  Positive for appetite change. Negative for chills, fatigue and fever.  HENT:   Positive for trouble swallowing (liquids and solids). Negative for lump/mass, mouth sores, nosebleeds and sore throat.        +decreased  taste  Eyes:  Negative for eye problems.  Respiratory:  Positive for cough (with clear sputum) and wheezing. Negative for shortness of breath.   Cardiovascular:  Negative for chest pain, leg swelling and palpitations.  Gastrointestinal:  Positive for constipation. Negative for abdominal pain, diarrhea, nausea and vomiting.  Genitourinary:  Negative for bladder incontinence, difficulty urinating, dysuria, frequency, hematuria and nocturia.   Musculoskeletal:  Positive for neck pain (10/10 severity). Negative for arthralgias, back pain, flank pain and myalgias.  Skin:  Negative for itching and rash.  Neurological:  Negative for dizziness, headaches and numbness.  Hematological:  Does not bruise/bleed easily.  Psychiatric/Behavioral:  Negative for depression, sleep disturbance and suicidal ideas. The patient is not nervous/anxious.   All other systems reviewed and are negative.    VITALS:   There were no vitals taken for this visit.  Wt Readings from Last 3 Encounters:  08/24/22 150 lb 12.8 oz (68.4 kg)  08/07/22 150 lb (68 kg)  08/04/22 153 lb 3.5 oz (69.5 kg)    There is no height or weight on file to calculate BMI.  Performance status (ECOG): 1 - Symptomatic but completely ambulatory  PHYSICAL EXAM:   Physical Exam Vitals and nursing note reviewed. Exam conducted with a chaperone present.  Constitutional:      Appearance: Normal appearance.  Cardiovascular:     Rate and Rhythm: Normal rate and regular rhythm.     Pulses: Normal pulses.     Heart sounds: Normal heart sounds.  Pulmonary:     Effort: Pulmonary effort is normal.     Breath  sounds: Normal breath sounds.  Abdominal:     Palpations: Abdomen is soft. There is no hepatomegaly, splenomegaly or mass.     Tenderness: There is no abdominal tenderness.  Musculoskeletal:     Right lower leg: No edema.     Left lower leg: No edema.  Lymphadenopathy:     Cervical: No cervical adenopathy.     Right cervical: No superficial, deep or posterior cervical adenopathy.    Left cervical: No superficial, deep or posterior cervical adenopathy.     Upper Body:     Right upper body: No supraclavicular or axillary adenopathy.     Left upper body: No supraclavicular or axillary adenopathy.  Neurological:     General: No focal deficit present.     Mental Status: He is alert and oriented to person, place, and time.  Psychiatric:        Mood and Affect: Mood normal.        Behavior: Behavior normal.     LABS:      Latest Ref Rng & Units 09/15/2022    1:04 PM 08/24/2022   12:35 PM 08/04/2022   11:09 AM  CBC  WBC 4.0 - 10.5 K/uL 8.4  6.9  7.2   Hemoglobin 13.0 - 17.0 g/dL 9.4  9.8  9.6   Hematocrit 39.0 - 52.0 % 30.1  31.2  30.6   Platelets 150 - 400 K/uL 356  283  295       Latest Ref Rng & Units 09/15/2022    1:04 PM 08/24/2022   12:35 PM 08/04/2022   11:09 AM  CMP  Glucose 70 - 99 mg/dL 528  413  244   BUN 8 - 23 mg/dL 27  24  24    Creatinine 0.61 - 1.24 mg/dL 0.10  2.72  5.36   Sodium 135 - 145 mmol/L 135  135  138   Potassium 3.5 - 5.1 mmol/L 4.3  4.7  4.5   Chloride 98 - 111 mmol/L 103  101  103   CO2 22 - 32 mmol/L 26  26  24    Calcium 8.9 - 10.3 mg/dL 9.8  64.4  03.4   Total Protein 6.5 - 8.1 g/dL 7.2  7.1  7.7   Total Bilirubin 0.3 - 1.2 mg/dL 0.5  0.6  0.4   Alkaline Phos 38 - 126 U/L 50  48  53   AST 15 - 41 U/L 15  20  16    ALT 0 - 44 U/L 15  20  17       Lab Results  Component Value Date   CEA 2.2 01/19/2013   /  CEA  Date Value Ref Range Status  01/19/2013 2.2 0.0 - 5.0 ng/mL Final    Comment:    Performed at [facility]   No results  found for: "PSA1" No results found for: "CAN199" No results found for: "CAN125"  No results found for: "TOTALPROTELP", "ALBUMINELP", "A1GS", "A2GS", "BETS", "BETA2SER", "GAMS", "MSPIKE", "SPEI" Lab Results  Component Value Date   TIBC 244 (L) 08/04/2022   TIBC 251 05/12/2022   FERRITIN 273 08/04/2022   FERRITIN 195 05/12/2022   IRONPCTSAT 19 08/04/2022   IRONPCTSAT 18 05/12/2022   Lab Results  Component Value Date   LDH 172 11/12/2020   LDH 175 10/26/2018   LDH 179 10/25/2013     STUDIES:   NM PET Image Restag (PS) Skull Base To Thigh  Result Date: 09/10/2022 CLINICAL DATA:  Subsequent treatment strategy for oropharyngeal cancer. EXAM: NUCLEAR MEDICINE PET SKULL BASE TO THIGH TECHNIQUE: 7.6 mCi F-18 FDG was injected intravenously. Full-ring PET imaging was performed from the skull base to thigh after the radiotracer. CT data was obtained and used for attenuation correction and anatomic localization. Fasting blood glucose: 131 mg/dl COMPARISON:  PET-CT dated 05/28/2022 FINDINGS: Mediastinal blood pool activity: SUV max 2.3 Liver activity: SUV max NA NECK: Suspected 2.4 x 3.3 cm lesion along the anterior floor of the mouth (series 202/image 45), max SUV 14.8. This previously measured approximately 2.8 x 2.9 cm of max SUV 16.4. Overlying central mandibular resection. 5 mm short axis right level 2 node (series 202/image 81), max SUV 3.1, previously 6 mm with max SUV 3.0. Additional focal hypermetabolism just lateral to the right base of tongue, max SUV 6.9, more conspicuous than on the prior. 9 mm short axis node at the base of the right neck (series 202/image 60), max SUV 6.9, more conspicuous than on the prior. Incidental CT findings: None. CHEST: No suspicious pulmonary nodules. No hypermetabolic thoracic lymphadenopathy. Left chest port terminates in the mid SVC. Incidental CT findings: Atherosclerotic calcifications of the aortic arch. Mild coronary atherosclerosis of the LAD and right  coronary artery. ABDOMEN/PELVIS: 3.1 x 2.9 cm lesion along the posterior right hepatic dome, previously 2.6 x 1.7 cm, although motion degraded on the prior. Very mild peripheral hypermetabolism, max SUV 3.8, more conspicuous than on the  prior. No abnormal hypermetabolism in the spleen, pancreas, or adrenal glands. No hypermetabolic abdominopelvic lymphadenopathy. Incidental CT findings: Atherosclerotic calcifications abdominal aorta and branch vessels. Prostatomegaly, suggesting BPH. SKELETON: Intramuscular hypermetabolism along the posterior neck, left greater than right, favored to be physiologic. Intramuscular hypermetabolism along the bilateral thoracic paraspinal regions, right greater than left, favored to be physiologic. Mild focal hypermetabolism overlying in the posterior right shoulder, max SUV 4.9, without convincing CT correlate. This is likely physiologic but is noted to be asymmetric. No focal hypermetabolic activity to suggest skeletal metastases. Incidental CT findings: None. IMPRESSION: 3.3 cm mass along the anterior floor of the mouth, corresponding to the patient's known or pharyngeal cancer, grossly unchanged. Right cervical nodal metastases, mildly progressive. 3.1 cm right hepatic metastasis, favored to be mildly progressive. Electronically Signed   By: Charline Bills M.D.   On: 09/10/2022 15:23

## 2022-09-15 NOTE — Progress Notes (Signed)
No treatment today per Dr.K. 

## 2022-09-15 NOTE — Progress Notes (Signed)
DISCONTINUE ON PATHWAY REGIMEN - Head and Neck     A cycle is every 21 days:     Pembrolizumab   **Always confirm dose/schedule in your pharmacy ordering system**  REASON: Disease Progression PRIOR TREATMENT: [id number]: Pembrolizumab 200 mg q21 Days for up to 24 Months TREATMENT RESPONSE: Progressive Disease (PD)    Patient Characteristics: Oral Cavity, Metastatic, Second Line, Not a Candidate for Immunotherapy Disease Classification: Oral Cavity AJCC T Category: T4a AJCC M Category: M1 AJCC N Category: pN1 AJCC 8 Stage Grouping: IVC Therapeutic Status: Metastatic Disease Line of Therapy: Second [employer]

## 2022-09-15 NOTE — Patient Instructions (Signed)
[facility]  Discharge Instructions  You were seen and examined today by Dr. Ellin Saba.  Dr. Ellin Saba discussed your most recent lab work and PET scan which revealed cancer progression. This means that the Rande Lawman is not helping.  Dr. Ellin Saba has recommended changing treatment, which would entail taking a more intense treatment. Ideally, you would be on a combination of chemotherapy and a monoclonal antibody. If you do not want treatment with chemotherapy, we can explore the option of only using the antibody, however this will not be as effective as it would be if combined with chemotherapy. If you do not wish to pursue any further treatment, Dr. Ellin Saba can refer you to [facility] for ongoing symptom management to keep you comfortable at home.  You may call us back and let us know what you decide to do.  Follow-up as scheduled.  Thank you for choosing [name] to provide your oncology and hematology care.   To afford each patient quality time with our provider, please arrive at least 15 minutes before your scheduled appointment time. You may need to reschedule your appointment if you arrive late (10 or more minutes). Arriving late affects you and other patients whose appointments are after yours.  Also, if you miss three or more appointments without notifying the office, you may be dismissed from the clinic at the provider's discretion.    Again, thank you for choosing [facility].  Our hope is that these requests will decrease the amount of time that you wait before being seen by our physicians.   If you have a lab appointment with the Cancer Center - please note that after April 8th, all labs will be drawn in the cancer center.  You do not have to check in or register with the main entrance as you have in the past but will complete your check-in at the cancer center.             _____________________________________________________________  Should you have questions after your visit to [facility], please contact our office at [phone] and follow the prompts.  Our office hours are 8:00 a.m. to 4:30 p.m. Monday - Thursday and 8:00 a.m. to 2:30 p.m. Friday.  Please note that voicemails left after 4:00 p.m. may not be returned until the following business day.  We are closed weekends and all major holidays.  You do have access to a nurse 24-7, just call the main number to the clinic [phone] and do not press any options, hold on the line and a nurse will answer the phone.    For prescription refill requests, have your pharmacy contact our office and allow 72 hours.    Masks are no longer required in the cancer centers. If you would like for your care team to wear a mask while they are taking care of you, please let them know. You may have one support person who is at least 82 years old accompany you for your appointments.

## 2022-09-16 LAB — T4: T4, Total: 5.1 ug/dL (ref 4.5–12.0)

## 2022-09-17 LAB — SURGICAL PATHOLOGY

## 2022-09-21 NOTE — Progress Notes (Unsigned)
Nathaniel Hicks, male    DOB: [birth year]    MRN: [id number]   Brief patient profile:  81 yobm  quit smoking Oct  2023 p admission to [facility]  referred to pulmonary clinic in [place]  07/07/2022 by Dr  Glendora Score for sob with wheezing .    Admit date: 10/14/2021 Discharge date: 10/28/2021   Admission Diagnoses: Oral cancer   Discharge Diagnoses:  Principal Problem:   Oral cancer (HCC) Active Problems:   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HCC)   Malnutrition of moderate degree     Discharged Condition: good   Hospital Course: 82 year old male presented to the hospital for surgical management of floor of mouth cancer.  See operative note.  He was observed in the ICU after surgery with a new tracheostomy and drains and an NG feeding tube in place.  He had difficulty with nausea and vomiting the first couple of days that prevented starting nutrition and required replacement of the NG tube.  The hospitalist service was asked to consult.  He had a low grade fever a couple of days after surgery presumed to be related to aspiration and was started on Unasyn.  The fever and vomiting improved.  The flap under his tongue remained intact through his hospitalization but became dusky.  There was good blood flow with needle exploration.  He was transferred to a regular hospital room receiving tube feeds.  His tracheostomy tube was changed to a cuffless tube.  Drains were removed.  He completed 7 days of Unasyn.  He worked with Psychologist, sport and exercise, PT, and OT.  Ultimately, his feeding tube was removed and his diet was advanced and evaluated by speech pathology.  At the time of discharge, he is tolerating an oral diet.  His tracheostomy remains in place and he is tolerating the Passy-Muir valve.  The flap under the tongue is evolving with some necrosis of the skin that may later require debridement.  He is being discharged to inpatient rehab for further recovery.   Consults:  Hospitalist, SLP, PT, OT    Significant Diagnostic Studies: None   Treatments: surgery: Floor of mouth resection, tracheostomy, bilateral neck dissections, and platysma flap     History of Present Illness  07/07/2022  Pulmonary/ 1st office eval/ Donta Mcinroy / [facility] on ACEi  Chief Complaint  Patient presents with   Pulmonary Consult    Referred by Dr. Glendora Score.    Dyspnea:  more limited by fatigue/weakness from RT than sob  Cough: cough / wheezing at night Sleep: bed is flat/ 2 pillows does fine despite "wheezing" SABA use: just symbicort 80 not sure it's doing anything  02: none  Tol pureed diet ok  Rec Stop amlodipine-benazepril  and start amlopine 10-olmesartan 40 mg one daily in its place Stop amlodipine if you have it in a separate bottle and keep it separate. Then take all the bottles with you to Dr Scharlene Gloss office   Please schedule a follow up office visit in 4 weeks, sooner if needed  with all medications /inhalers/ solutions in hand   08/07/2022  f/u ov/[place] office/Aleksa Catterton re: ? Acei case?/  maint on ? did not  bring meds  Last RT about  one month/ last Chemo 08/04/22 Chief Complaint  Patient presents with   Follow-up    Cough and SOB are unchanged since the last visit.    Dyspnea:  amb with cane     Cough: some better off acei /  non productive raspy upper airway apttern  Sleeping: bed is flat / 3 pillows on side  SABA use: symbicort 80 2bid and not using much saba  Puree swallowing ok   Rec Symbicort 80 Take 2 puffs first thing in am and then another 2 puffs about 12 hours later.  Work on inhaler technique:  Continue to leave off the hydralazine  If our list is not correct please call us back with the discrepancy   Please schedule a follow up office visit in 6 weeks, call sooner if needed with all medications /inhalers/ solutions in hand    09/22/2022  f/u ov/[place] office/Issam Carlyon re: cough/doe maint on symbicort 80  did  bring meds  Chief Complaint  Patient presents with    Follow-up    6 week follow up  Dyspnea:  walks with cane mostly housebound / very slow pace, not really limited by breathing so much as frailty/ fatigu  Cough: none  Sleeping: bed is flat/ 2 pillows s    resp cc  SABA use: none   02: none      No obvious day to day or daytime variability or assoc excess/ purulent sputum or mucus plugs or hemoptysis or cp or chest tightness, subjective wheeze or overt sinus or hb symptoms.    Also denies any obvious fluctuation of symptoms with weather or environmental changes or other aggravating or alleviating factors except as outlined above   No unusual exposure hx or h/o childhood pna/ asthma or knowledge of premature birth.  Current Allergies, Complete Past Medical History, Past Surgical History, Family History, and Social History were reviewed in [employer] record.  ROS  The following are not active complaints unless bolded Hoarseness, sore throat, dysphagia, dental problems, itching, sneezing,  nasal congestion or discharge of excess mucus or purulent secretions, ear ache,   fever, chills, sweats, unintended wt loss or wt gain, classically pleuritic or exertional cp,  orthopnea pnd or arm/hand swelling  or leg swelling, presyncope, palpitations, abdominal pain, anorexia, nausea, vomiting, diarrhea  or change in bowel habits or change in bladder habits, change in stools or change in urine, dysuria, hematuria,  rash, arthralgias, visual complaints, headache, numbness, weakness or ataxia or problems with walking/ uses cane or coordination,  change in mood or  memory.        Current Meds  Medication Sig   acetaminophen (TYLENOL) 650 MG CR tablet Take 650 mg by mouth every 8 (eight) hours as needed for pain.   budesonide-formoterol (SYMBICORT) 80-4.5 MCG/ACT inhaler Inhale 2 puffs into the lungs in the morning and at bedtime.   Cholecalciferol (VITAMIN D3) 50 MCG (2000 UT) TABS Take 2,000 Units by mouth daily.    HYDROcodone-acetaminophen (HYCET) 7.5-325 mg/15 ml solution Take 15 mLs by mouth every 6 (six) hours as needed for moderate pain.   lidocaine (XYLOCAINE) 2 % solution Take by mouth.   magnesium oxide (MAG-OX) 400 MG tablet Take 400 mg by mouth daily.   Menthol, Topical Analgesic, (BIOFREEZE EX) Apply 1 application  topically daily as needed (pain).   naloxone (NARCAN) nasal spray 4 mg/0.1 mL SMARTSIG:Both Nares   PEMBROLIZUMAB IV Inject into the vein every 21 ( twenty-one) days.   Polyethyl Glycol-Propyl Glycol (GOODSENSE LUBRICANT EYE DROPS OP) Place 1 drop into both eyes daily as needed (dry eyes).   pravastatin (PRAVACHOL) 80 MG tablet Take 80 mg by mouth daily.   predniSONE (DELTASONE) 10 MG tablet Take  4 each am x 2 days,  2 each am x 2 days,  1 each am x 2 days and stop   RAPAFLO 8 MG CAPS capsule Take 8 mg by mouth daily. Silodosin   vitamin B-12 (CYANOCOBALAMIN) 500 MCG tablet Take 500 mcg by mouth daily.           Past Medical History:  Diagnosis Date   Arthritis    BPH (benign prostatic hyperplasia)    Difficult intubation    HOH (hard of hearing)    Hyperlipidemia 11/02/2017   Hypertension    Hypertension 11/02/2017   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HCC)    2014   Vitamin D deficiency 11/02/2017      Objective:    Wts   09/22/2022        148   08/07/22 150 lb (68 kg)  08/04/22 153 lb 3.5 oz (69.5 kg)  07/14/22 154 lb 9.6 oz (70.1 kg)    Vital signs reviewed  09/22/2022  - Note at rest 02 sats  94% on RA   General appearance:    amb bm very hard to understand/ still mild pw    HEENT : Oropharynx poorly viz due to throat swellling/ still mild pseudowheeze       NECK :  without  apparent JVD/ palpable Nodes/TM    LUNGS: no acc muscle use,  Min barrel  contour chest wall with bilateral  slightly decreased bs s audible wheeze and  without cough on insp or exp maneuvers and min  Hyperresonant  to  percussion bilaterally    CV:  RRR  no s3 or murmur or increase in P2, and  no edema   ABD:  soft and nontender    MS:  Nl gait/ ext warm without deformities Or obvious joint restrictions  calf tenderness, cyanosis or clubbing    SKIN: warm and dry without lesions    NEURO:  alert, approp, nl sensorium with  no motor or cerebellar deficits apparent.         I personally reviewed images and agree with radiology impression as follows: PET CT  06/07/22 1. 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 2. Enlarged prostate. 3. Aortic atherosclerosis (ICD10-I70.0). Coronary artery calcification. 4.  Emphysema (ICD10-J43.9).  Assessment

## 2022-09-22 ENCOUNTER — Ambulatory Visit (INDEPENDENT_AMBULATORY_CARE_PROVIDER_SITE_OTHER): Payer: 59 | Admitting: Internal Medicine

## 2022-09-22 ENCOUNTER — Encounter: Payer: Self-pay | Admitting: Internal Medicine

## 2022-09-22 ENCOUNTER — Other Ambulatory Visit: Payer: Self-pay | Admitting: Internal Medicine

## 2022-09-22 VITALS — BP 90/62 | HR 123 | Ht 71.0 in | Wt 148.2 lb

## 2022-09-22 DIAGNOSIS — I10 Essential (primary) hypertension: Secondary | ICD-10-CM

## 2022-09-22 DIAGNOSIS — R0609 Other forms of dyspnea: Secondary | ICD-10-CM | POA: Diagnosis not present

## 2022-09-22 MED ORDER — AMLODIPINE-OLMESARTAN 5-20 MG PO TABS: 1.0000 | ORAL_TABLET | Freq: Every day | ORAL | 1 refills | Status: DC

## 2022-09-22 NOTE — Patient Instructions (Addendum)
1st try to break the amlopidine-olmesartan 10-40 in half  and  just take one half daily but skip tomorrow's  dose or if can't break in half just go ahead and start the new strength on Sept 12  @ 5-20 one pill daily (which is the same amount of medicine as one half the 10-40   Symbicort 80  up to 2 pffs every 12 hours   Work on inhaler technique:  relax and gently blow all the way out then take a nice smooth full deep breath back in, triggering the inhaler at same time you start breathing in.  Hold breath in for at least  5 seconds if you can. Blow out symbicort  thru nose. Rinse and gargle with water when done.  If mouth or throat bother you at all,  try brushing teeth/gums/tongue with arm and hammer toothpaste/ make a slurry and gargle and spit out.     If you are satisfied with your treatment plan,  let your doctor know and he/she can either refill your medications or you can return here when your prescription runs out.     If in any way you are not 100% satisfied,  please tell us.  If 100% better, tell your friends!  Pulmonary follow up is as needed

## 2022-09-23 NOTE — Assessment & Plan Note (Signed)
07/07/22 Try off acei due to hoarseness/ cough > improved 08/07/2022  >>>   over treated as of 09/22/2022 on olmesartan/ amlodpine 10/40 so reduce by half and f/u with pcp but would strongly advised avoidance of acei in pt with a somwhat unstable upper airway related to head and nect ca and it's treatments  F/u here is prn          Each maintenance medication was reviewed in detail including emphasizing most importantly the difference between maintenance and prns and under what circumstances the prns are to be triggered using an action plan format where appropriate.  Total time for H and P, chart review, counseling, reviewing hfa device(s) and generating customized AVS unique to this office visit / same day charting = 30 min

## 2022-09-23 NOTE — Assessment & Plan Note (Signed)
Quit smoking 10/2021 p prolonged admit/ rehab for sq cell ca floor of mouth> transient trach - 07/07/2022   Walked on RA  x  2  lap (s) =  approx 300  ft  @ slow/mod pace, stopped due to legs got tired/ no sob with lowest 02 sats 95%   - 08/07/2022  After extensive coaching inhaler device,  effectiveness =    75% from a baseline of < 50%  (not triggering simultaneously with insp) > continue symb 80  - 09/22/2022  After extensive coaching inhaler device,  effectiveness =    75% (late trigger) > from a baseline of <25% so may not acutually need it >  rx symbicort 80 2bid prn   Doing much better off acei and on ineffective symbicort so doubt he really has much AB and rec continue to use symbicort low dose prn in case there is AB  component   At this point though appears more limited by frailty so pulmonary f/u can be prn with all meds in hand using a trust but verify approach to confirm accurate Medication  Reconciliation The principal here is that until we are certain that the  patients are doing what we've asked, it makes no sense to ask them to do more.

## 2022-09-25 IMAGING — US US PELVIS LIMITED
1 series · 9 of 9 positions shown · non-contrast
Comparison: None.

CLINICAL DATA: Pelvic and perineal pain. Evaluate for inguinal
hernia.

EXAM:
LIMITED ULTRASOUND OF PELVIS
TECHNIQUE: Limited transabdominal ultrasound examination of the pelvis was
performed.

[Series 1: us pelvis limited (transabdominal only) · 9 of 9 slices shown]
[im 1/9]
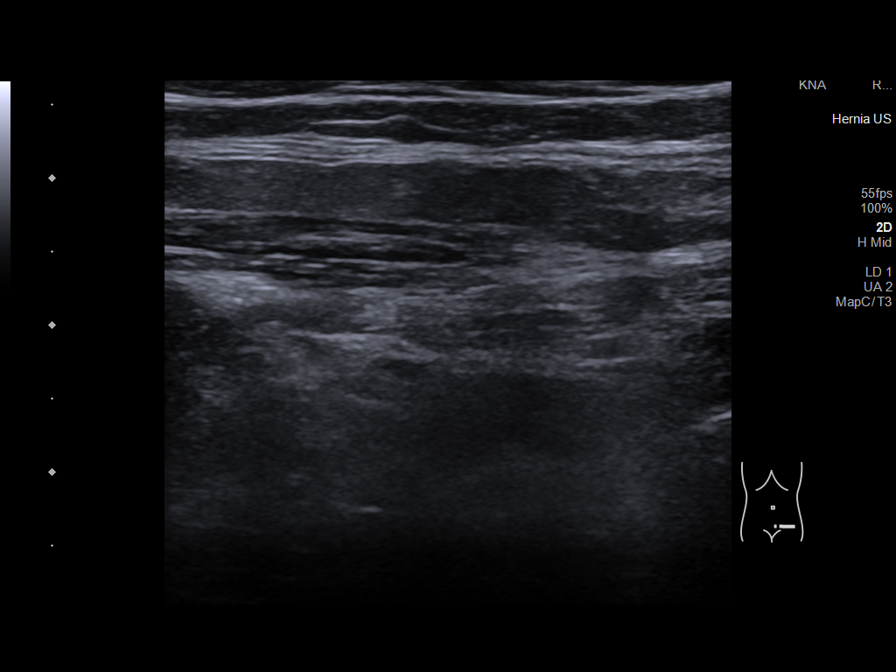
[im 2/9]
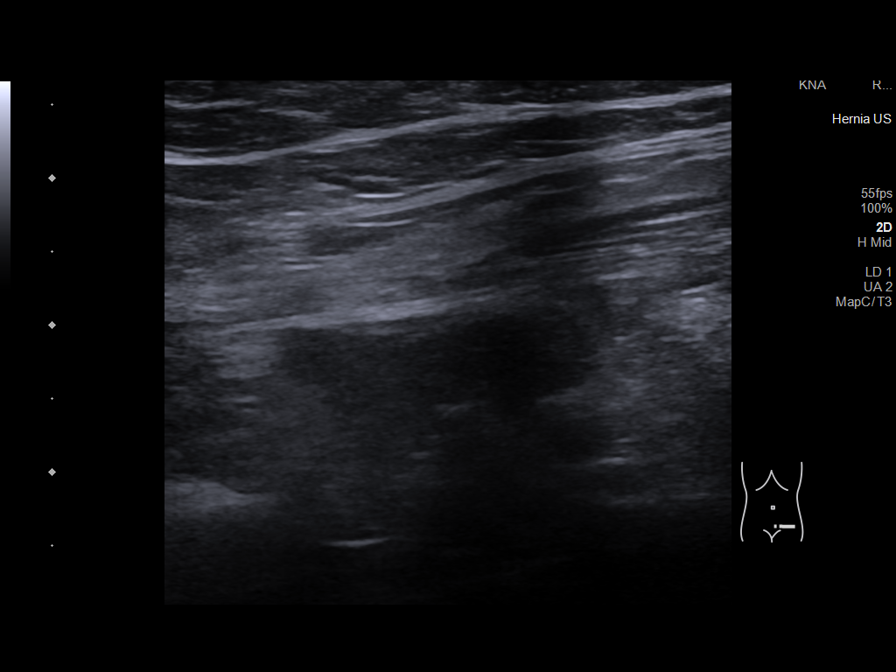
[im 3/9]
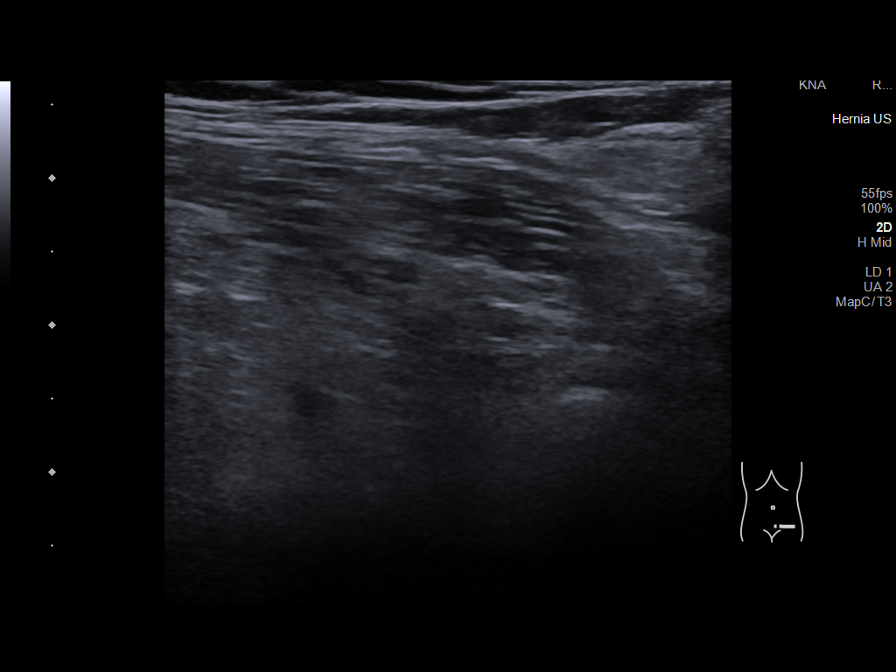
[im 4/9]
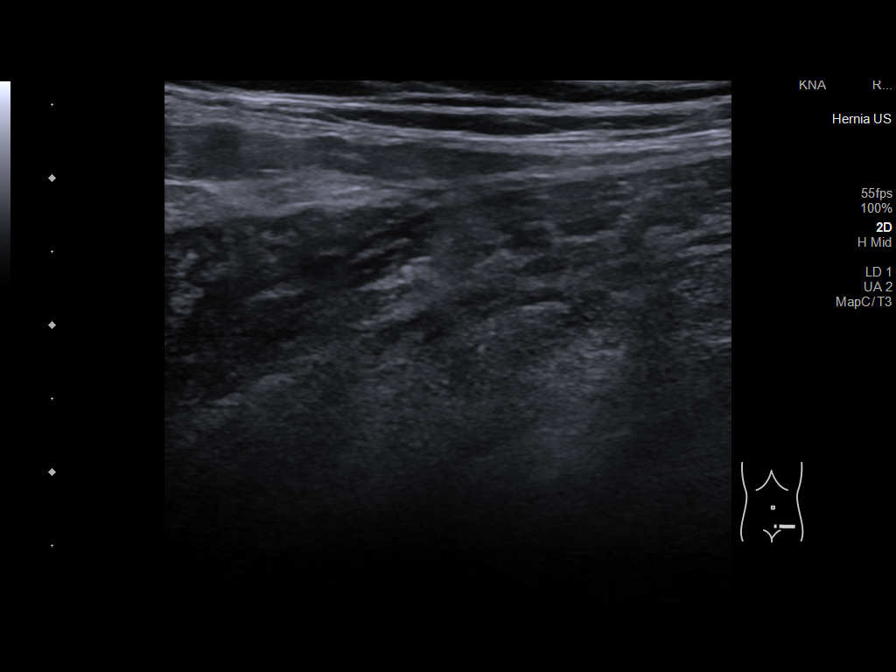
[im 5/9]
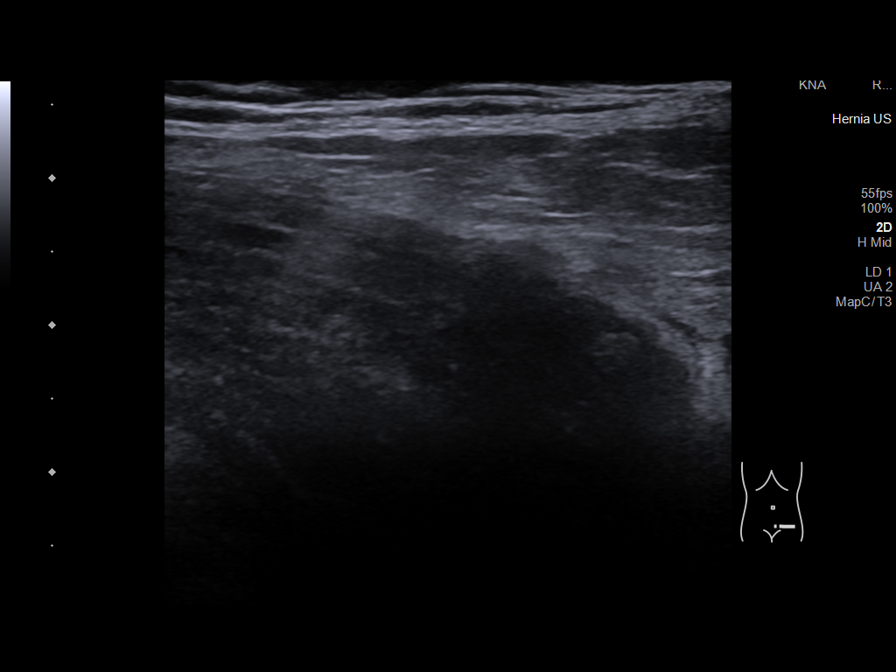
[im 6/9]
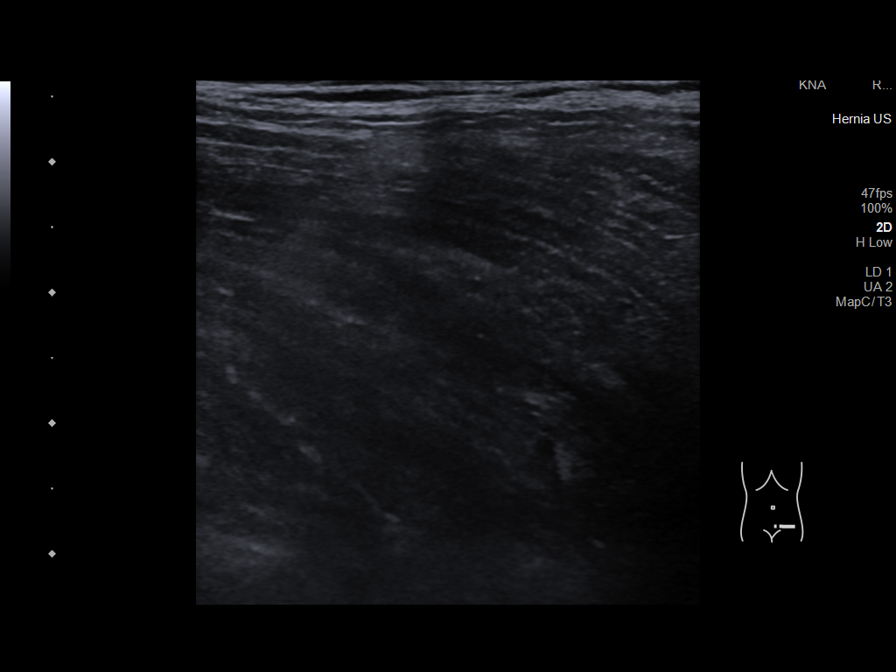
[im 7/9]
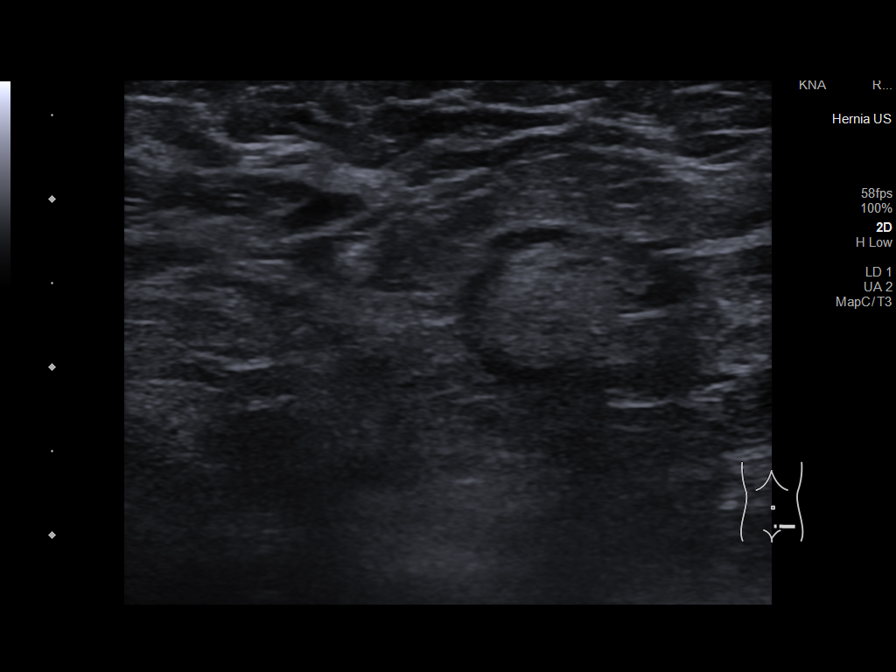
[im 8/9]
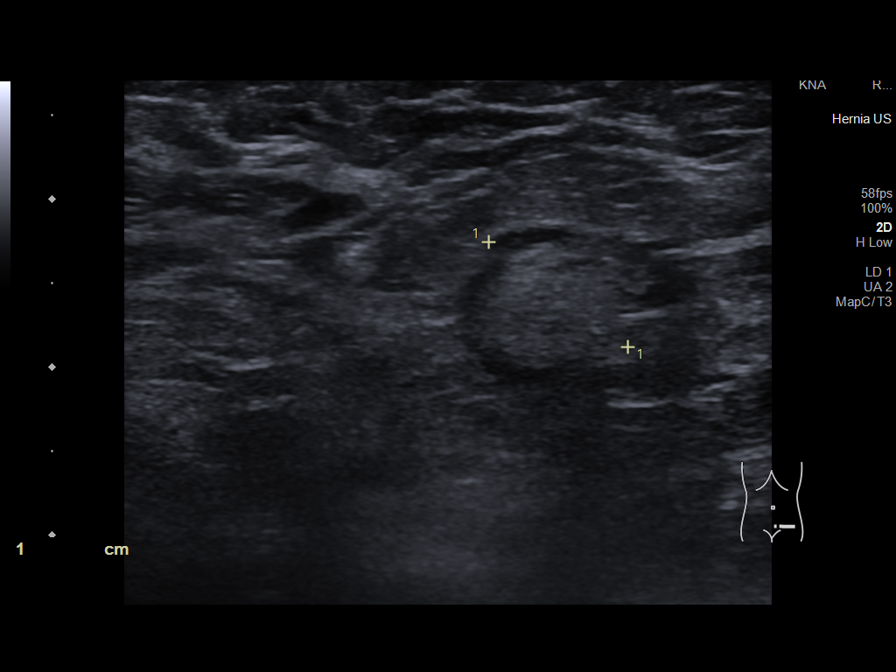
[im 9/9]
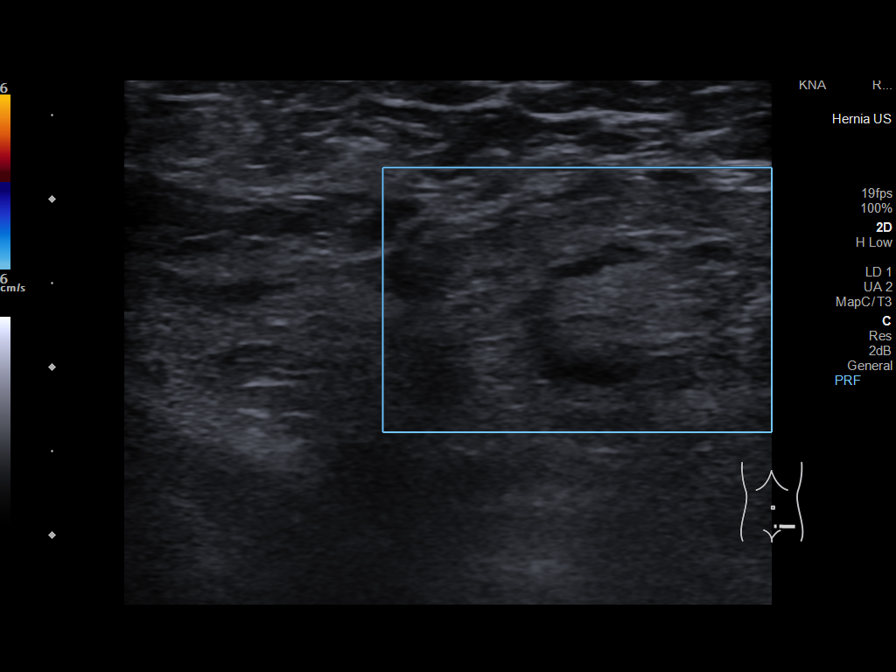

[9 of 9 positions shown; findings below may reference images not displayed]

FINDINGS: Focused ultrasound exam was performed in the region of the right
groin. No discernible groin hernia by sonography. No evidence for
soft tissue mass or cystic lesion. Small lymph node identified in
the left groin region.
IMPRESSION: No sonographic features to suggest left inguinal hernia.

## 2022-09-27 NOTE — Progress Notes (Signed)
[name] [address], [place] [address]    Clinic Day:  09/28/2022  Referring physician: [name], MD  Patient Care Team: [name], MD as PCP - General (Internal Medicine) [name], MD as Consulting Physician (Urology) [name], MD as Medical Oncologist (Medical Oncology) [name], RN as Oncology Nurse Navigator (Medical Oncology)   ASSESSMENT & PLAN:   Assessment: 1.  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  Enlarged contralateral right submandibular node.  Enlargement of the left submandibular gland probably reactive.  No abscess. - Biopsy (07/18/2021) floor of the mouth: Invasive well to moderately differentiated keratinizing squamous cell carcinoma.  Tumor cells negative for p16. - 10/14/2021: Floor of the mouth resection, tracheostomy, bilateral selective neck dissections zones 1-3 by Dr. [name] - Pathology: Invasive moderately differentiated keratinizing SCC, 4.1 cm, carcinoma invades for a depth of about 1.9 cm and involves saliva gland tissue.  Anterior, posterior, right, left resection margins are involved.  Deep resection margin is negative.  Metastatic carcinoma 1/3 level 1 lymph nodes.  3 level 2 and 3 level 3 lymph nodes negative for carcinoma.  0/10 lymph nodes involved in the left side neck zone 1, 2, 3 dissection.  ENE not identified.  LVI/perineural invasion not identified. - I have talked to pathologist.  Reexcision margins were considered negative.  He has a high risk feature which is T4 tumor. -  PET scan on 12/12/2021: Soft tissue thickening and calcification along the ventral aspect of the trachea with associated hypermetabolism corresponding to recent tracheostomy site.  7 mm right  paratracheal lymph node new with SUV 2.4.  Hypermetabolism along the dome of the right hepatic lobe with probable 2.4 cm low-attenuation lesion - Liver lesion biopsy (01/28/2022): Metastatic squamous cell carcinoma, keratinizing. - NGS testing: PD-L1 (22 C3): CPS: 100, CD274 (PD-L1) amplified, JAK2 amplified, PIK3CA pathogenic variant exon 10, T p53 pathogenic variant, MS-stable, TMB-low - Per Dr. [name], he also has local recurrence of disease in the floor of the mouth. - Keytruda from 03/10/2022 through 08/24/2022 with progression - XRT to the floor of the mouth on 06/29/2022 - PET scan 09/10/2022: Liver lesion increased in size measures 3.1 x 2.9 cm, previously 2.6 x 1.7 cm.  Right cervical lymph node meta stasis, mildly progressive. - HER2 by IHC 0 on biopsy from 01/28/2022. - Weekly cetuximab started on   2.  Social/family history: - He lives in his apartment by himself and is independent of ADLs and IADLs.  He does not drive.  He worked as a Curator and several other jobs.  Quit smoking 1 month ago.  He smoked 1 pack/week for more than 50 years. - 1 brother had agent orange related cancer.  Another brother also had cancer, type unknown to the patient.   3.  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 - He was on close surveillance rather than adjuvant chemotherapy.    Plan: 1.  Stage IV (PT4PN1 M1) SCC of the floor of the mouth, p16 negative: - We reviewed results of PET scan from 09/10/2022 and images with the patient, and his sisters.  Liver lesion has increased in size measuring 3.1 x 2.9 cm, previously 2.6 x 1.7 cm.  Right cervical lymph  nodes are mildly progressive. - Based on these findings, we discussed treatment options including weekly cetuximab versus best supportive care in the form of hospice. - We discussed pathology reports which showed HER2 by IHC was 0. - We discussed side effects of cetuximab including but not limited to life-threatening  anaphylactic reaction. - Upon further discussion, he wanted to try cetuximab.  If he is unable to tolerate it, he will enroll in palliative care/hospice.   2.  Oral pain: - Continue liquid hydrocodone as needed.   3.  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  Hemoglobin is 9.4 with MCV 102.   4.  Hypotension: - Blood pressure is 103/63.  Continue to hold blood pressure medication.    Orders Placed This Encounter  Procedures   CBC with Differential    Standing Status:   Future    Standing Expiration Date:   10/07/2023   Comprehensive metabolic panel    Standing Status:   Future    Standing Expiration Date:   10/07/2023   Magnesium    Standing Status:   Future    Standing Expiration Date:   10/07/2023   CBC with Differential    Standing Status:   Future    Standing Expiration Date:   10/14/2023   Comprehensive metabolic panel    Standing Status:   Future    Standing Expiration Date:   10/14/2023   Magnesium    Standing Status:   Future    Standing Expiration Date:   10/14/2023   CBC with Differential    Standing Status:   Future    Standing Expiration Date:   10/21/2023   Comprehensive metabolic panel    Standing Status:   Future    Standing Expiration Date:   10/21/2023   Magnesium    Standing Status:   Future    Standing Expiration Date:   10/21/2023   CBC with Differential    Standing Status:   Future    Standing Expiration Date:   10/28/2023   Comprehensive metabolic panel    Standing Status:   Future    Standing Expiration Date:   10/28/2023   Magnesium    Standing Status:   Future    Standing Expiration Date:   10/28/2023   CBC with Differential    Standing Status:   Future    Standing Expiration Date:   11/04/2023   Comprehensive metabolic panel    Standing Status:   Future    Standing Expiration Date:   11/04/2023   Magnesium    Standing Status:   Future    Standing Expiration Date:   11/04/2023      I,[name],acting as a  scribe for [name], MD.,have documented all relevant documentation on the behalf of [name], MD,as directed by  [name], MD while in the presence of [name], MD.   I, [name] MD, have reviewed the above documentation for accuracy and completeness, and I agree with the above.   [name], MD   9/16/20245:31 PM  CHIEF COMPLAINT:   Diagnosis: squamous cell carcinoma of the floor of the mouth and testicular seminoma    Cancer Staging  Cancer of floor of mouth [facility]) Staging form: Oral Cavity, AJCC 8th Edition - Clinical stage from 11/25/2021: Stage IVC (cT4a, cN1, pM1) - Signed by [name], MD on 02/04/2022  Testicle cancer, right Staging form: Testis, AJCC 7th Edition - Clinical: Stage I (T2, N0, M0) - Signed by Dellis Anes  S, PA-C on 07/23/2013    Prior Therapy: 1. Pembrolizumab, 03/10/22 - 08/24/22 2. 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 Testis, biopsy, right - SEMINOMA. PLEASE SEE COMMENT. 2. Testis, tumor, right - SEMINOMA, 7.5 CM. - ANGIOLYMPHATIC INVASION PRESENT. - RESECTION MARGINS, NEGATIVE FOR ATYPIA OR MALIGNANCY.   01/18/2013 PET scan   Status post right orchiectomy. No findings specific for metastatic disease. Small para-aortic nodes measuring up to 5 mm short axis, without convincing hypermetabolism. Given location, attention on follow-up is suggested.   04/17/2013 Imaging   CT CAP- No evidence of metastatic disease in the chest, abdomen or pelvis. Proximal LAD coronary artery calcification.   07/20/2013 Imaging   CT abd/pelvis- No evidence of metastatic disease in the abdomen or pelvis.   10/24/2013 Imaging   CT abd/pelvis- No findings to suggest metastatic disease in the abdomen or pelvis   10/24/2013 Imaging   Chest xray-  Probable COPD.  Negative for metastatic disease.   02/21/2014 Imaging   CT abd/pelvis- Stable abdominal pelvic CT status post right orchectomy. No evidence of adenopathy or other metastatic disease.   02/21/2014 Imaging   Chest xray- Left lower lobe mild atelectasis and/or infiltrate.     09/10/2014 Imaging   CT abd/pelvis- Status post right orchiectomy.   No evidence of metastatic disease.   3 mm nonobstructing right upper pole renal calculus. No hydronephrosis.   03/14/2015 Imaging   CT abd/pelvis- Stable exam. No evidence of metastatic disease or other acute findings within the abdomen or pelvis.   09/13/2015 Imaging   CT abd/pelvis- No acute findings and no evidence for mass or adenopathy.    04/24/2016 Imaging   CT abd/pelvis: IMPRESSION: 1. Stable exam. No new or progressive findings. No features to suggest metastatic disease.   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HCC)  10/14/2021 Initial Diagnosis   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HCC)   11/25/2021 Cancer Staging   Staging form: Oral Cavity, AJCC 8th Edition - Clinical stage from 11/25/2021: Stage IVC (cT4a, cN1, pM1) - Signed by [name], MD on 02/04/2022 Histopathologic type: Squamous cell carcinoma, NOS Stage prefix: Initial diagnosis Histologic grade (G): G2 Histologic grading system: 3 grade system   03/10/2022 - 08/24/2022 Chemotherapy   Patient is on Treatment Plan : HEAD/NECK Pembrolizumab (200) q21d     10/07/2022 -  Chemotherapy   Patient is on Treatment Plan : HEAD/NECK Cetuximab q7d        INTERVAL HISTORY:   Nathaniel Hicks is a 82 y.o. male presenting to clinic today for follow up of squamous cell carcinoma of the floor of the mouth and testicular seminoma. He was last seen by me on 09/15/22.  Today, he states that he is doing well overall. His appetite level is at 90%. His energy level is at 50%.  PAST MEDICAL HISTORY:   Past Medical History: Past Medical History:  Diagnosis Date   Arthritis    BPH (benign prostatic  hyperplasia)    Difficult intubation    HOH (hard of hearing)    Hyperlipidemia 11/02/2017   Hypertension    Hypertension 11/02/2017   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HCC)    2014   Vitamin D deficiency 11/02/2017    Surgical History: Past Surgical History:  Procedure Laterality Date   COLONOSCOPY N/A 11/24/2012   Procedure: COLONOSCOPY;  Surgeon: Malissa Hippo, MD;  Location: [facility];  Service: Endoscopy;  Laterality: N/A;  830-moved to 730 Ann notified pt   FLOOR OF MOUTH BIOPSY N/A 10/14/2021   Procedure: FLOOR OF MOUTH RESECTION;  Surgeon: Christia Reading, MD;  Location: [facility] OR;  Service: ENT;  Laterality: N/A;   HEMORROIDECTOMY     KNEE ARTHROSCOPY WITH LATERAL MENISECTOMY Right 08/31/2017   Procedure: KNEE ARTHROSCOPY WITH LATERAL MENISECTOMY;  Surgeon: Vickki Hearing, MD;  Location: [facility] ORS;  Service: Orthopedics;  Laterality: Right;   LESION EXCISION N/A 03/23/2012   Procedure: EXCISION NEOPLASM SCALP ;  Surgeon: Dalia Heading, MD;  Location: [facility] ORS;  Service: General;  Laterality: N/A;  Excision of Scalp Neoplasm   ORCHIECTOMY Right 12/20/2012   Procedure: RIGHT RADICAL ORCHIECTOMY/POSSIBLE BX RIGHT TESTICLE;  Surgeon: Ky Barban, MD;  Location: [facility];  Service: Urology;  Laterality: Right;   PORTACATH PLACEMENT Left 03/25/2022   Procedure: INSERTION PORT-A-CATH;  Surgeon: Franky Macho, MD;  Location: [facility];  Service: General;  Laterality: Left;   PROSTATE SURGERY     RADICAL NECK DISSECTION Bilateral 10/14/2021   Procedure: NECK DISSECTION;  Surgeon: Christia Reading, MD;  Location: [facility] OR;  Service: ENT;  Laterality: Bilateral;   SCALP LACERATION REPAIR     [facility]-Dr Katrinka Blazing   SKIN FULL THICKNESS GRAFT Bilateral 10/14/2021   Procedure: PLATYSMA FLAP CLOSURE;  Surgeon: Christia Reading, MD;  Location: [facility] OR;  Service: ENT;  Laterality: Bilateral;   TOOTH EXTRACTION  10/14/2021   Procedure: DENTAL EXTRACTIONS;  Surgeon: Christia Reading, MD;  Location: [facility] OR;  Service: ENT;;    TRACHEOSTOMY TUBE PLACEMENT N/A 10/14/2021   Procedure: TRACHEOSTOMY;  Surgeon: Christia Reading, MD;  Location[facility] OR;  Service: ENT;  Laterality: N/A;    Social History: Social History   Socioeconomic History   Marital status: Single    Spouse name: Not on file   Number of children: Not on file   Years of education: Not on file   Highest education level: Not on file  Occupational History   Not on file  Tobacco Use   Smoking status: Former    Current packs/day: 0.25    Average packs/day: 0.3 packs/day for 50.0 years (12.5 ttl pk-yrs)    Types: Cigarettes   Smokeless tobacco: Never  Vaping Use   Vaping status: Never Used  Substance and Sexual Activity   Alcohol use: Not Currently   Drug use: No   Sexual activity: Yes    Birth control/protection: None  Other Topics Concern   Not on file  Social History Narrative   Not on file   Social Determinants of Health   Financial Resource Strain: Not on file  Food Insecurity: Not on file  Transportation Needs: Not on file  Physical Activity: Not on file  Stress: Not on file  Social Connections: Not on file  Intimate Partner Violence: Not on file    Family History: Family History  Problem Relation Age of Onset   Cancer Brother    Cancer Brother     Current Medications:  Current Outpatient Medications:    acetaminophen (TYLENOL) 650 MG CR tablet, Take 650 mg by mouth every 8 (eight) hours as needed for pain., Disp: , Rfl:    amLODipine-olmesartan (AZOR) 5-20 MG tablet, TAKE 1 TABLET BY MOUTH DAILY, Disp: 90 tablet, Rfl: 0   budesonide-formoterol (SYMBICORT) 80-4.5 MCG/ACT inhaler, Inhale 2 puffs into the lungs in the morning and at bedtime., Disp: 1 each, Rfl: 12   Cholecalciferol (VITAMIN D3)  50 MCG (2000 UT) TABS, Take 2,000 Units by mouth daily., Disp: , Rfl:    HYDROcodone-acetaminophen (HYCET) 7.5-325 mg/15 ml solution, Take 15 mLs by mouth every 6 (six) hours as needed for moderate pain., Disp: 473 mL, Rfl: 0    lidocaine (XYLOCAINE) 2 % solution, Take by mouth., Disp: , Rfl:    magnesium oxide (MAG-OX) 400 MG tablet, Take 400 mg by mouth daily., Disp: , Rfl:    Menthol, Topical Analgesic, (BIOFREEZE EX), Apply 1 application  topically daily as needed (pain)., Disp: , Rfl:    naloxone (NARCAN) nasal spray 4 mg/0.1 mL, SMARTSIG:Both Nares, Disp: , Rfl:    PEMBROLIZUMAB IV, Inject into the vein every 21 ( twenty-one) days., Disp: , Rfl:    Polyethyl Glycol-Propyl Glycol (GOODSENSE LUBRICANT EYE DROPS OP), Place 1 drop into both eyes daily as needed (dry eyes)., Disp: , Rfl:    pravastatin (PRAVACHOL) 80 MG tablet, Take 80 mg by mouth daily., Disp: , Rfl:    predniSONE (DELTASONE) 10 MG tablet, Take  4 each am x 2 days,   2 each am x 2 days,  1 each am x 2 days and stop, Disp: 14 tablet, Rfl: 0   RAPAFLO 8 MG CAPS capsule, Take 8 mg by mouth daily. Silodosin, Disp: , Rfl:    vitamin B-12 (CYANOCOBALAMIN) 500 MCG tablet, Take 500 mcg by mouth daily., Disp: , Rfl:    Allergies: No Known Allergies  REVIEW OF SYSTEMS:   Review of Systems  Constitutional:  Negative for chills, fatigue and fever.  HENT:   Negative for lump/mass, mouth sores, nosebleeds, sore throat and trouble swallowing.   Eyes:  Negative for eye problems.  Respiratory:  Negative for cough and shortness of breath.   Cardiovascular:  Negative for chest pain, leg swelling and palpitations.  Gastrointestinal:  Negative for abdominal pain, constipation, diarrhea, nausea and vomiting.  Genitourinary:  Negative for bladder incontinence, difficulty urinating, dysuria, frequency, hematuria and nocturia.   Musculoskeletal:  Negative for arthralgias, back pain, flank pain, myalgias and neck pain.  Skin:  Negative for itching and rash.  Neurological:  Negative for dizziness, headaches and numbness.  Hematological:  Does not bruise/bleed easily.  Psychiatric/Behavioral:  Negative for depression, sleep disturbance and suicidal ideas. The patient is  not nervous/anxious.   All other systems reviewed and are negative.    VITALS:   Blood pressure 103/63, pulse 91, temperature 98.6 F (37 C), temperature source Oral, resp. rate 18, SpO2 100%.  Wt Readings from Last 3 Encounters:  09/22/22 148 lb 3.2 oz (67.2 kg)  08/24/22 150 lb 12.8 oz (68.4 kg)  08/07/22 150 lb (68 kg)    There is no height or weight on file to calculate BMI.  Performance status (ECOG): 1 - Symptomatic but completely ambulatory  PHYSICAL EXAM:   Physical Exam Vitals and nursing note reviewed. Exam conducted with a chaperone present.  Constitutional:      Appearance: Normal appearance.  Cardiovascular:     Rate and Rhythm: Normal rate and regular rhythm.     Pulses: Normal pulses.     Heart sounds: Normal heart sounds.  Pulmonary:     Effort: Pulmonary effort is normal.     Breath sounds: Normal breath sounds.  Abdominal:     Palpations: Abdomen is soft. There is no hepatomegaly, splenomegaly or mass.     Tenderness: There is no abdominal tenderness.  Musculoskeletal:     Right lower leg: No edema.     Left  lower leg: No edema.  Lymphadenopathy:     Cervical: No cervical adenopathy.     Right cervical: No superficial, deep or posterior cervical adenopathy.    Left cervical: No superficial, deep or posterior cervical adenopathy.     Upper Body:     Right upper body: No supraclavicular or axillary adenopathy.     Left upper body: No supraclavicular or axillary adenopathy.  Neurological:     General: No focal deficit present.     Mental Status: He is alert and oriented to person, place, and time.  Psychiatric:        Mood and Affect: Mood normal.        Behavior: Behavior normal.     LABS:      Latest Ref Rng & Units 09/15/2022    1:04 PM 08/24/2022   12:35 PM 08/04/2022   11:09 AM  CBC  WBC 4.0 - 10.5 K/uL 8.4  6.9  7.2   Hemoglobin 13.0 - 17.0 g/dL 9.4  9.8  9.6   Hematocrit 39.0 - 52.0 % 30.1  31.2  30.6   Platelets 150 - 400 K/uL 356  283   295       Latest Ref Rng & Units 09/15/2022    1:04 PM 08/24/2022   12:35 PM 08/04/2022   11:09 AM  CMP  Glucose 70 - 99 mg/dL 161  096  045   BUN 8 - 23 mg/dL 27  24  24    Creatinine 0.61 - 1.24 mg/dL 4.09  8.11  9.14   Sodium 135 - 145 mmol/L 135  135  138   Potassium 3.5 - 5.1 mmol/L 4.3  4.7  4.5   Chloride 98 - 111 mmol/L 103  101  103   CO2 22 - 32 mmol/L 26  26  24    Calcium 8.9 - 10.3 mg/dL 9.8  78.2  95.6   Total Protein 6.5 - 8.1 g/dL 7.2  7.1  7.7   Total Bilirubin 0.3 - 1.2 mg/dL 0.5  0.6  0.4   Alkaline Phos 38 - 126 U/L 50  48  53   AST 15 - 41 U/L 15  20  16    ALT 0 - 44 U/L 15  20  17       Lab Results  Component Value Date   CEA 2.2 01/19/2013   /  CEA  Date Value Ref Range Status  01/19/2013 2.2 0.0 - 5.0 ng/mL Final    Comment:    Performed at [facility]   No results found for: "PSA1" No results found for: "CAN199" No results found for: "CAN125"  No results found for: "TOTALPROTELP", "ALBUMINELP", "A1GS", "A2GS", "BETS", "BETA2SER", "GAMS", "MSPIKE", "SPEI" Lab Results  Component Value Date   TIBC 244 (L) 08/04/2022   TIBC 251 05/12/2022   FERRITIN 273 08/04/2022   FERRITIN 195 05/12/2022   IRONPCTSAT 19 08/04/2022   IRONPCTSAT 18 05/12/2022   Lab Results  Component Value Date   LDH 172 11/12/2020   LDH 175 10/26/2018   LDH 179 10/25/2013     STUDIES:   NM PET Image Restag (PS) Skull Base To Thigh  Result Date: 09/10/2022 CLINICAL DATA:  Subsequent treatment strategy for oropharyngeal cancer. EXAM: NUCLEAR MEDICINE PET SKULL BASE TO THIGH TECHNIQUE: 7.6 mCi F-18 FDG was injected intravenously. Full-ring PET imaging was performed from the skull base to thigh after the radiotracer. CT data was obtained and used for attenuation correction and anatomic localization. Fasting blood glucose:  131 mg/dl COMPARISON:  PET-CT dated 05/28/2022 FINDINGS: Mediastinal blood pool activity: SUV max 2.3 Liver activity: SUV max NA NECK: Suspected 2.4 x  3.3 cm lesion along the anterior floor of the mouth (series 202/image 45), max SUV 14.8. This previously measured approximately 2.8 x 2.9 cm of max SUV 16.4. Overlying central mandibular resection. 5 mm short axis right level 2 node (series 202/image 81), max SUV 3.1, previously 6 mm with max SUV 3.0. Additional focal hypermetabolism just lateral to the right base of tongue, max SUV 6.9, more conspicuous than on the prior. 9 mm short axis node at the base of the right neck (series 202/image 60), max SUV 6.9, more conspicuous than on the prior. Incidental CT findings: None. CHEST: No suspicious pulmonary nodules. No hypermetabolic thoracic lymphadenopathy. Left chest port terminates in the mid SVC. Incidental CT findings: Atherosclerotic calcifications of the aortic arch. Mild coronary atherosclerosis of the LAD and right coronary artery. ABDOMEN/PELVIS: 3.1 x 2.9 cm lesion along the posterior right hepatic dome, previously 2.6 x 1.7 cm, although motion degraded on the prior. Very mild peripheral hypermetabolism, max SUV 3.8, more conspicuous than on the prior. No abnormal hypermetabolism in the spleen, pancreas, or adrenal glands. No hypermetabolic abdominopelvic lymphadenopathy. Incidental CT findings: Atherosclerotic calcifications abdominal aorta and branch vessels. Prostatomegaly, suggesting BPH. SKELETON: Intramuscular hypermetabolism along the posterior neck, left greater than right, favored to be physiologic. Intramuscular hypermetabolism along the bilateral thoracic paraspinal regions, right greater than left, favored to be physiologic. Mild focal hypermetabolism overlying in the posterior right shoulder, max SUV 4.9, without convincing CT correlate. This is likely physiologic but is noted to be asymmetric. No focal hypermetabolic activity to suggest skeletal metastases. Incidental CT findings: None. IMPRESSION: 3.3 cm mass along the anterior floor of the mouth, corresponding to the patient's known or  pharyngeal cancer, grossly unchanged. Right cervical nodal metastases, mildly progressive. 3.1 cm right hepatic metastasis, favored to be mildly progressive. Electronically Signed   By: Charline Bills M.D.   On: 09/10/2022 15:23

## 2022-09-28 ENCOUNTER — Encounter: Payer: Self-pay | Admitting: Hematology

## 2022-09-28 ENCOUNTER — Inpatient Hospital Stay (HOSPITAL_BASED_OUTPATIENT_CLINIC_OR_DEPARTMENT_OTHER): Payer: 59 | Admitting: Hematology

## 2022-09-28 VITALS — BP 103/63 | HR 91 | Temp 98.6°F | Resp 18

## 2022-09-28 DIAGNOSIS — C049 Malignant neoplasm of floor of mouth, unspecified: Secondary | ICD-10-CM | POA: Diagnosis not present

## 2022-09-28 DIAGNOSIS — Z5111 Encounter for antineoplastic chemotherapy: Secondary | ICD-10-CM | POA: Diagnosis not present

## 2022-09-28 DIAGNOSIS — D630 Anemia in neoplastic disease: Secondary | ICD-10-CM | POA: Diagnosis not present

## 2022-09-28 DIAGNOSIS — C787 Secondary malignant neoplasm of liver and intrahepatic bile duct: Secondary | ICD-10-CM | POA: Diagnosis not present

## 2022-09-28 DIAGNOSIS — K1379 Other lesions of oral mucosa: Secondary | ICD-10-CM | POA: Diagnosis not present

## 2022-09-28 DIAGNOSIS — Z87891 Personal history of nicotine dependence: Secondary | ICD-10-CM | POA: Diagnosis not present

## 2022-09-28 DIAGNOSIS — Z809 Family history of malignant neoplasm, unspecified: Secondary | ICD-10-CM | POA: Diagnosis not present

## 2022-09-28 DIAGNOSIS — Z79620 Long term (current) use of immunosuppressive biologic: Secondary | ICD-10-CM | POA: Diagnosis not present

## 2022-09-28 DIAGNOSIS — Z930 Tracheostomy status: Secondary | ICD-10-CM | POA: Diagnosis not present

## 2022-09-28 DIAGNOSIS — I959 Hypotension, unspecified: Secondary | ICD-10-CM | POA: Diagnosis not present

## 2022-09-28 DIAGNOSIS — C770 Secondary and unspecified malignant neoplasm of lymph nodes of head, face and neck: Secondary | ICD-10-CM | POA: Diagnosis not present

## 2022-09-28 MED ORDER — DOXYCYCLINE HYCLATE 100 MG PO TABS: 100.0000 mg | ORAL_TABLET | Freq: Two times a day (BID) | ORAL | 3 refills | Status: DC

## 2022-09-28 NOTE — Patient Instructions (Addendum)
[facility] at [facility] Discharge Instructions   You were seen and examined today by Dr. Ellin Saba.  He reviewed again the results of the PET scan. It is showing slight growth in the spot in the liver in a 3 month period.   He discussed with your treatment options. It would either be treatment with a monoclonal antibody called Erbitux. The other option would be to forego any further treatment and opt to be comfortable.  The most common side effects of the Erbitux is skin rash and diarrhea. In this region where we live, serious allergic reactions are common with this drug as a lot of people in the area have been bitten and exposed to a lone star tick.   You have opted to try treatment with the Erbitux. This is given once a week.   Return as scheduled.    Thank you for choosing [facility] at [facility] to provide your oncology and hematology care.  To afford each patient quality time with our provider, please arrive at least 15 minutes before your scheduled appointment time.   If you have a lab appointment with the Cancer Center please come in thru the Main Entrance and check in at the main information desk.  You need to re-schedule your appointment should you arrive 10 or more minutes late.  We strive to give you quality time with our providers, and arriving late affects you and other patients whose appointments are after yours.  Also, if you no show three or more times for appointments you may be dismissed from the clinic at the providers discretion.     Again, thank you for choosing [facility].  Our hope is that these requests will decrease the amount of time that you wait before being seen by our physicians.       _____________________________________________________________  Should you have questions after your visit to [facility], please contact our office at [phone] and follow the prompts.  Our office  hours are 8:00 a.m. and 4:30 p.m. Monday - Friday.  Please note that voicemails left after 4:00 p.m. may not be returned until the following business day.  We are closed weekends and major holidays.  You do have access to a nurse 24-7, just call the main number to the clinic [phone] and do not press any options, hold on the line and a nurse will answer the phone.    For prescription refill requests, have your pharmacy contact our office and allow 72 hours.    Due to Covid, you will need to wear a mask upon entering the hospital. If you do not have a mask, a mask will be given to you at the Main Entrance upon arrival. For doctor visits, patients may have 1 support person age 25 or older with them. For treatment visits, patients can not have anyone with them due to social distancing guidelines and our immunocompromised population.

## 2022-09-28 NOTE — Progress Notes (Signed)
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

## 2022-09-28 NOTE — Addendum Note (Signed)
Addended by: [name] on: 09/28/2022 06:03 PM   Modules accepted: Orders

## 2022-09-29 ENCOUNTER — Other Ambulatory Visit: Payer: Self-pay

## 2022-09-29 DIAGNOSIS — C049 Malignant neoplasm of floor of mouth, unspecified: Secondary | ICD-10-CM | POA: Diagnosis not present

## 2022-09-30 ENCOUNTER — Other Ambulatory Visit: Payer: Self-pay

## 2022-10-07 ENCOUNTER — Inpatient Hospital Stay: Payer: 59

## 2022-10-07 ENCOUNTER — Other Ambulatory Visit: Payer: Self-pay | Admitting: Oncology

## 2022-10-07 VITALS — BP 108/56 | HR 78 | Temp 97.6°F | Resp 18

## 2022-10-07 DIAGNOSIS — Z5111 Encounter for antineoplastic chemotherapy: Secondary | ICD-10-CM | POA: Diagnosis not present

## 2022-10-07 DIAGNOSIS — I959 Hypotension, unspecified: Secondary | ICD-10-CM | POA: Diagnosis not present

## 2022-10-07 DIAGNOSIS — Z87891 Personal history of nicotine dependence: Secondary | ICD-10-CM | POA: Diagnosis not present

## 2022-10-07 DIAGNOSIS — Z79620 Long term (current) use of immunosuppressive biologic: Secondary | ICD-10-CM | POA: Diagnosis not present

## 2022-10-07 DIAGNOSIS — D630 Anemia in neoplastic disease: Secondary | ICD-10-CM | POA: Diagnosis not present

## 2022-10-07 DIAGNOSIS — C049 Malignant neoplasm of floor of mouth, unspecified: Secondary | ICD-10-CM

## 2022-10-07 DIAGNOSIS — K1379 Other lesions of oral mucosa: Secondary | ICD-10-CM | POA: Diagnosis not present

## 2022-10-07 DIAGNOSIS — Z930 Tracheostomy status: Secondary | ICD-10-CM | POA: Diagnosis not present

## 2022-10-07 DIAGNOSIS — C770 Secondary and unspecified malignant neoplasm of lymph nodes of head, face and neck: Secondary | ICD-10-CM | POA: Diagnosis not present

## 2022-10-07 DIAGNOSIS — C787 Secondary malignant neoplasm of liver and intrahepatic bile duct: Secondary | ICD-10-CM | POA: Diagnosis not present

## 2022-10-07 DIAGNOSIS — Z809 Family history of malignant neoplasm, unspecified: Secondary | ICD-10-CM | POA: Diagnosis not present

## 2022-10-07 LAB — COMPREHENSIVE METABOLIC PANEL
ALT: 11 U/L (ref 0–44)
AST: 14 U/L — ABNORMAL LOW (ref 15–41)
Albumin: 3.5 g/dL (ref 3.5–5.0)
Alkaline Phosphatase: 48 U/L (ref 38–126)
Anion gap: 7 (ref 5–15)
BUN: 21 mg/dL (ref 8–23)
CO2: 28 mmol/L (ref 22–32)
Calcium: 10.1 mg/dL (ref 8.9–10.3)
Chloride: 100 mmol/L (ref 98–111)
Creatinine, Ser: 0.98 mg/dL (ref 0.61–1.24)
GFR, Estimated: >60 mL/min (ref >60)
Glucose, Bld: 112 mg/dL — ABNORMAL HIGH (ref 70–99)
Potassium: 4.5 mmol/L (ref 3.5–5.1)
Sodium: 135 mmol/L (ref 135–145)
Total Bilirubin: 0.6 mg/dL (ref 0.3–1.2)
Total Protein: 7.2 g/dL (ref 6.5–8.1)

## 2022-10-07 LAB — CBC WITH DIFFERENTIAL/PLATELET
Abs Immature Granulocytes: 0.01 10*3/uL (ref 0.00–0.07)
Basophils Absolute: 0.1 10*3/uL (ref 0.0–0.1)
Basophils Relative: 1 %
Eosinophils Absolute: 0.3 10*3/uL (ref 0.0–0.5)
Eosinophils Relative: 5 %
HCT: 30 % — ABNORMAL LOW (ref 39.0–52.0)
Hemoglobin: 9.5 g/dL — ABNORMAL LOW (ref 13.0–17.0)
Immature Granulocytes: 0 %
Lymphocytes Relative: 18 %
Lymphs Abs: 1.1 10*3/uL (ref 0.7–4.0)
MCH: 32.2 pg (ref 26.0–34.0)
MCHC: 31.7 g/dL (ref 30.0–36.0)
MCV: 101.7 fL — ABNORMAL HIGH (ref 80.0–100.0)
Monocytes Absolute: 0.7 10*3/uL (ref 0.1–1.0)
Monocytes Relative: 12 %
Neutro Abs: 3.8 10*3/uL (ref 1.7–7.7)
Neutrophils Relative %: 64 %
Platelets: 268 10*3/uL (ref 150–400)
RBC: 2.95 MIL/uL — ABNORMAL LOW (ref 4.22–5.81)
RDW: 13.6 % (ref 11.5–15.5)
WBC: 6 10*3/uL (ref 4.0–10.5)
nRBC: 0 % (ref 0.0–0.2)

## 2022-10-07 LAB — MAGNESIUM: Magnesium: 2.1 mg/dL (ref 1.7–2.4)

## 2022-10-07 MED ORDER — FAMOTIDINE IN NACL 20-0.9 MG/50ML-% IV SOLN
20.0000 mg | Freq: Once | INTRAVENOUS | Status: AC
  Administered 2022-10-07: 20 mg via INTRAVENOUS
  Filled 2022-10-07: qty 50

## 2022-10-07 MED ORDER — HEPARIN SOD (PORK) LOCK FLUSH 100 UNIT/ML IV SOLN
500.0000 [IU] | Freq: Once | INTRAVENOUS | Status: AC | PRN
  Administered 2022-10-07: 500 [IU]

## 2022-10-07 MED ORDER — SODIUM CHLORIDE 0.9 % IV SOLN: Freq: Once | INTRAVENOUS | Status: AC

## 2022-10-07 MED ORDER — HYDROCODONE-ACETAMINOPHEN 7.5-325 MG/15ML PO SOLN: 15.0000 mL | Freq: Four times a day (QID) | ORAL | 0 refills | Status: DC | PRN

## 2022-10-07 MED ORDER — SODIUM CHLORIDE 0.9% FLUSH
10.0000 mL | INTRAVENOUS | Status: DC | PRN
  Administered 2022-10-07: 10 mL

## 2022-10-07 MED ORDER — ALBUTEROL SULFATE (2.5 MG/3ML) 0.083% IN NEBU
2.5000 mg | INHALATION_SOLUTION | Freq: Once | RESPIRATORY_TRACT | Status: AC
  Administered 2022-10-07: 2.5 mg via RESPIRATORY_TRACT
  Filled 2022-10-07: qty 3

## 2022-10-07 MED ORDER — SODIUM CHLORIDE 0.9 % IV SOLN
10.0000 mg | Freq: Once | INTRAVENOUS | Status: AC
  Administered 2022-10-07: 10 mg via INTRAVENOUS
  Filled 2022-10-07: qty 10

## 2022-10-07 MED ORDER — DIPHENHYDRAMINE HCL 50 MG/ML IJ SOLN
50.0000 mg | Freq: Once | INTRAMUSCULAR | Status: AC
  Administered 2022-10-07: 50 mg via INTRAVENOUS
  Filled 2022-10-07: qty 1

## 2022-10-07 MED ORDER — HYDROCODONE-ACETAMINOPHEN 7.5-325 MG/15ML PO SOLN
15.0000 mL | Freq: Once | ORAL | Status: AC
  Administered 2022-10-07: 15 mL via ORAL
  Filled 2022-10-07: qty 15

## 2022-10-07 MED ORDER — METHYLPREDNISOLONE SODIUM SUCC 125 MG IJ SOLR
125.0000 mg | Freq: Once | INTRAMUSCULAR | Status: AC
  Administered 2022-10-07: 125 mg via INTRAVENOUS
  Filled 2022-10-07: qty 2

## 2022-10-07 MED ORDER — CETUXIMAB CHEMO IV INJECTION 200 MG/100ML
400.0000 mg/m2 | Freq: Once | INTRAVENOUS | Status: AC
  Administered 2022-10-07: 700 mg via INTRAVENOUS
  Filled 2022-10-07: qty 300

## 2022-10-07 NOTE — Progress Notes (Signed)
In pharmacy communication from Dr Ellin Saba.    Albuterol added per above.  Pryor Ochoa, PharmD

## 2022-10-07 NOTE — Progress Notes (Signed)
Labs meet parameters for today. Will obtain consent and proceed with treatment as planned.   1145-post albuteral treatment, patient complaining up mouth burning, hurting worse than before. Dr. Anders Simmonds notified and will give pain medication as ordered.   Treatment given per orders. Patient tolerated it well without problems. Vitals stable and discharged home from clinic via wheelchair. Follow up as scheduled.

## 2022-10-07 NOTE — Patient Instructions (Addendum)
[facility]  Discharge Instructions: Thank you for choosing [facility] to provide your oncology and hematology care.  If you have a lab appointment with the Cancer Center - please note that after April 8th, 2024, all labs will be drawn in the cancer center.  You do not have to check in or register with the main entrance as you have in the past but will complete your check-in in the cancer center.  Wear comfortable clothing and clothing appropriate for easy access to any Portacath or PICC line.   We strive to give you quality time with your provider. You may need to reschedule your appointment if you arrive late (15 or more minutes).  Arriving late affects you and other patients whose appointments are after yours.  Also, if you miss three or more appointments without notifying the office, you may be dismissed from the clinic at the provider's discretion.      For prescription refill requests, have your pharmacy contact our office and allow 72 hours for refills to be completed.    Today you received the following chemotherapy and/or immunotherapy agents Erbitux   To help prevent nausea and vomiting after your treatment, we encourage you to take your nausea medication as directed.  BELOW ARE SYMPTOMS THAT SHOULD BE REPORTED IMMEDIATELY: *FEVER GREATER THAN 100.4 F (38 C) OR HIGHER *CHILLS OR SWEATING *NAUSEA AND VOMITING THAT IS NOT CONTROLLED WITH YOUR NAUSEA MEDICATION *UNUSUAL SHORTNESS OF BREATH *UNUSUAL BRUISING OR BLEEDING *URINARY PROBLEMS (pain or burning when urinating, or frequent urination) *BOWEL PROBLEMS (unusual diarrhea, constipation, pain near the anus) TENDERNESS IN MOUTH AND THROAT WITH OR WITHOUT PRESENCE OF ULCERS (sore throat, sores in mouth, or a toothache) UNUSUAL RASH, SWELLING OR PAIN  UNUSUAL VAGINAL DISCHARGE OR ITCHING   Items with * indicate a potential emergency and should be followed up as soon as possible or go to the  Emergency Department if any problems should occur.  Please show the CHEMOTHERAPY ALERT CARD or IMMUNOTHERAPY ALERT CARD at check-in to the Emergency Department and triage nurse.  Should you have questions after your visit or need to cancel or reschedule your appointment, please contact [facility] [phone]  and follow the prompts.  Office hours are 8:00 a.m. to 4:30 p.m. Monday - Friday. Please note that voicemails left after 4:00 p.m. may not be returned until the following business day.  We are closed weekends and major holidays. You have access to a nurse at all times for urgent questions. Please call the main number to the clinic [phone] and follow the prompts.  For any non-urgent questions, you may also contact your provider using MyChart. We now offer e-Visits for anyone 66 and older to request care online for non-urgent symptoms. For details visit [email or web address].   Also download the MyChart app! Go to the app store, search "MyChart", open the app, select [place], and log in with your MyChart username and password.

## 2022-10-08 ENCOUNTER — Telehealth: Payer: Self-pay | Admitting: *Deleted

## 2022-10-08 NOTE — Telephone Encounter (Signed)
24 hour chemotherapy call placed today, pt stated he felt good and voiced no side effects at this time. Pt made aware to call the clinic if needed, pt verbalized understanding.

## 2022-10-09 ENCOUNTER — Other Ambulatory Visit: Payer: Self-pay

## 2022-10-14 NOTE — Progress Notes (Signed)
[facility] [address], [place] [address]    Clinic Day:  10/15/2022  Referring physician: [name], MD  Patient Care Team: Nathaniel Stabile, MD as PCP - General (Internal Medicine) Nathaniel Askew, MD as Consulting Physician (Urology) Nathaniel Massed, MD as Medical Oncologist (Medical Oncology) Nathaniel Sarah, RN as Oncology Nurse Navigator (Medical Oncology)   ASSESSMENT & PLAN:   Assessment: 1.  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  Enlarged contralateral right submandibular node.  Enlargement of the left submandibular gland probably reactive.  No abscess. - Biopsy (07/18/2021) floor of the mouth: Invasive well to moderately differentiated keratinizing squamous cell carcinoma.  Tumor cells negative for p16. - 10/14/2021: Floor of the mouth resection, tracheostomy, bilateral selective neck dissections zones 1-3 by Dr. Jenne Hicks - Pathology: Invasive moderately differentiated keratinizing SCC, 4.1 cm, carcinoma invades for a depth of about 1.9 cm and involves saliva gland tissue.  Anterior, posterior, right, left resection margins are involved.  Deep resection margin is negative.  Metastatic carcinoma 1/3 level 1 lymph nodes.  3 level 2 and 3 level 3 lymph nodes negative for carcinoma.  0/10 lymph nodes involved in the left side neck zone 1, 2, 3 dissection.  ENE not identified.  LVI/perineural invasion not identified. - I have talked to pathologist.  Reexcision margins were considered negative.  He has a high risk feature which is T4 tumor. -  PET scan on 12/12/2021: Soft tissue thickening and calcification along the ventral aspect of the trachea with associated hypermetabolism corresponding to recent tracheostomy site.  7 mm right  paratracheal lymph node new with SUV 2.4.  Hypermetabolism along the dome of the right hepatic lobe with probable 2.4 cm low-attenuation lesion - Liver lesion biopsy (01/28/2022): Metastatic squamous cell carcinoma, keratinizing. - NGS testing: PD-L1 (22 C3): CPS: 100, CD274 (PD-L1) amplified, JAK2 amplified, PIK3CA pathogenic variant exon 10, T p53 pathogenic variant, MS-stable, TMB-low - Per Dr. Jenne Hicks, he also has local recurrence of disease in the floor of the mouth. - Keytruda from 03/10/2022 through 08/24/2022 with progression - XRT to the floor of the mouth on 06/29/2022 - PET scan 09/10/2022: Liver lesion increased in size measures 3.1 x 2.9 cm, previously 2.6 x 1.7 cm.  Right cervical lymph node meta stasis, mildly progressive. - HER2 by IHC 0 on biopsy from 01/28/2022. - Weekly cetuximab started on 10/07/2022   2.  Social/family history: - He lives in his apartment by himself and is independent of ADLs and IADLs.  He does not drive.  He worked as a Curator and several other jobs.  Quit smoking 1 month ago.  He smoked 1 pack/week for more than 50 years. - 1 brother had agent orange related cancer.  Another brother also had cancer, type unknown to the patient.   3.  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 - He was on close surveillance rather than adjuvant chemotherapy.    Plan: 1.  Stage IV (PT4PN1 M1) SCC of the floor of the mouth, p16 negative: - PET scan on 09/10/2022 showed progression. - He was started on weekly cetuximab, he tolerated first dose very well. - Reported left shoulder blade pain for the past couple of months on and off, which is  more prominent lately.  No metabolic activity on the recent PET scan. - Reviewed labs from today: Normal LFTs and electrolytes.  Magnesium is normal.  CBC grossly normal.  TSH is 2.1. - He will proceed with week 2 of cetuximab today and weekly moving forward.  RTC 2 weeks for follow-up with labs and  treatment.   2.  Oral pain: - Continue liquid hydrocodone as needed.   3.  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  Hemoglobin is 10.2 with MCV of 102.   4.  Hypotension: - Blood pressure is 89/52.  Recommend holding amlodipine/valsartan.    Orders Placed This Encounter  Procedures   CBC with Differential    Standing Status:   Future    Standing Expiration Date:   11/12/2023   Comprehensive metabolic panel    Standing Status:   Future    Standing Expiration Date:   11/12/2023   Magnesium    Standing Status:   Future    Standing Expiration Date:   11/12/2023      Nathaniel Hicks,acting as a scribe for Nathaniel Massed, MD.,have documented all relevant documentation on the behalf of Nathaniel Massed, MD,as directed by  Nathaniel Massed, MD while in the presence of Nathaniel Massed, MD.  I, Nathaniel Massed MD, have reviewed the above documentation for accuracy and completeness, and I agree with the above.    Nathaniel Massed, MD   10/3/20245:42 PM  CHIEF COMPLAINT:   Diagnosis: squamous cell carcinoma of the floor of the mouth and testicular seminoma    Cancer Staging  Cancer of floor of mouth [facility]) Staging form: Oral Cavity, AJCC 8th Edition - Clinical stage from 11/25/2021: Stage IVC (cT4a, cN1, pM1) - Signed by Nathaniel Massed, MD on 02/04/2022  Testicle cancer, right Staging form: Testis, AJCC 7th Edition - Clinical: Stage I (T2, N0, M0) - Signed by Nathaniel Newer, PA-C on 07/23/2013    Prior Therapy: 1. Pembrolizumab, 03/10/22 - 08/24/22 2. 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 Testis, biopsy, right - SEMINOMA. PLEASE SEE COMMENT. 2. Testis, tumor, right - SEMINOMA, 7.5 CM. - ANGIOLYMPHATIC INVASION PRESENT. - RESECTION MARGINS, NEGATIVE FOR ATYPIA OR  MALIGNANCY.   01/18/2013 PET scan   Status post right orchiectomy. No findings specific for metastatic disease. Small para-aortic nodes measuring up to 5 mm short axis, without convincing hypermetabolism. Given location, attention on follow-up is suggested.   04/17/2013 Imaging   CT CAP- No evidence of metastatic disease in the chest, abdomen or pelvis. Proximal LAD coronary artery calcification.   07/20/2013 Imaging   CT abd/pelvis- No evidence of metastatic disease in the abdomen or pelvis.   10/24/2013 Imaging   CT abd/pelvis- No findings to suggest metastatic disease in the abdomen or pelvis   10/24/2013 Imaging   Chest xray- Probable COPD.  Negative for metastatic disease.   02/21/2014 Imaging   CT abd/pelvis- Stable abdominal pelvic CT status post right orchectomy. No evidence of adenopathy or other metastatic disease.   02/21/2014 Imaging   Chest xray- Left lower lobe mild atelectasis and/or infiltrate.     09/10/2014 Imaging   CT abd/pelvis- Status post right orchiectomy.   No evidence of metastatic disease.   3 mm nonobstructing right upper pole renal calculus. No hydronephrosis.   03/14/2015 Imaging   CT abd/pelvis- Stable exam. No  evidence of metastatic disease or other acute findings within the abdomen or pelvis.   09/13/2015 Imaging   CT abd/pelvis- No acute findings and no evidence for mass or adenopathy.    04/24/2016 Imaging   CT abd/pelvis: IMPRESSION: 1. Stable exam. No new or progressive findings. No features to suggest metastatic disease.   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HCC)  10/14/2021 Initial Diagnosis   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HCC)   11/25/2021 Cancer Staging   Staging form: Oral Cavity, AJCC 8th Edition - Clinical stage from 11/25/2021: Stage IVC (cT4a, cN1, pM1) - Signed by Nathaniel Massed, MD on 02/04/2022 Histopathologic type: Squamous cell carcinoma, NOS Stage prefix: Initial diagnosis Histologic grade (G): G2 Histologic grading system: 3 grade system    03/10/2022 - 08/24/2022 Chemotherapy   Patient is on Treatment Plan : HEAD/NECK Pembrolizumab (200) q21d     10/07/2022 -  Chemotherapy   Patient is on Treatment Plan : HEAD/NECK Cetuximab q7d        INTERVAL HISTORY:   Nathaniel Hicks is a 82 y.o. male presenting to clinic today for follow up of squamous cell carcinoma of the floor of the mouth and testicular seminoma. He was last seen by me on 09/28/22.  Today, he states that he is doing well overall. His appetite level is at 75%. His energy level is at 50%. He is accompanied by a family member.   He reports he has increased energy and activity levels since his last visit. He c/o waxing and waning left shoulder pain for the last 2 months, that he treats with liquid hydrocodone BID. Pain medication makes him slightly drowsy and causes mouth soreness. Shoulder pain is worsened with activity and he often has to sit down to rest. He also notes on and off hip pain. He denies any lightheadedness.   He tolerated infusion well last week. He denies any diarrhea, loose stools, or skin rashes. He is not taking doxycycline as he did not receive it at his pharmacy.   PAST MEDICAL HISTORY:   Past Medical History: Past Medical History:  Diagnosis Date   Arthritis    BPH (benign prostatic hyperplasia)    Difficult intubation    HOH (hard of hearing)    Hyperlipidemia 11/02/2017   Hypertension    Hypertension 11/02/2017   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HCC)    2014   Vitamin D deficiency 11/02/2017    Surgical History: Past Surgical History:  Procedure Laterality Date   COLONOSCOPY N/A 11/24/2012   Procedure: COLONOSCOPY;  Surgeon: Malissa Hippo, MD;  [facility]: [facility];  Service: Endoscopy;  Laterality: N/A;  830-moved to 730 Ann notified pt   FLOOR OF MOUTH BIOPSY N/A 10/14/2021   Procedure: FLOOR OF MOUTH RESECTION;  Surgeon: Christia Reading, MD;  Location: [facility] OR;  Service: ENT;  Laterality: N/A;   HEMORROIDECTOMY     KNEE ARTHROSCOPY WITH LATERAL  MENISECTOMY Right 08/31/2017   Procedure: KNEE ARTHROSCOPY WITH LATERAL MENISECTOMY;  Surgeon: Vickki Hearing, MD;  Location: [facility];  Service: Orthopedics;  Laterality: Right;   LESION EXCISION N/A 03/23/2012   Procedure: EXCISION NEOPLASM SCALP ;  Surgeon: Dalia Heading, MD;  Location: [facility];  Service: General;  Laterality: N/A;  Excision of Scalp Neoplasm   ORCHIECTOMY Right 12/20/2012   Procedure: RIGHT RADICAL ORCHIECTOMY/POSSIBLE BX RIGHT TESTICLE;  Surgeon: Ky Barban, MD;  Location: [facility];  Service: Urology;  Laterality: Right;   PORTACATH PLACEMENT Left 03/25/2022   Procedure: INSERTION PORT-A-CATH;  Surgeon: Lovell Sheehan,  Loraine Leriche, MD;  Location: [facility];  Service: General;  Laterality: Left;   PROSTATE SURGERY     RADICAL NECK DISSECTION Bilateral 10/14/2021   Procedure: NECK DISSECTION;  Surgeon: Christia Reading, MD;  Location: [facility] OR;  Service: ENT;  Laterality: Bilateral;   SCALP LACERATION REPAIR     [facility]-Dr Katrinka Blazing   SKIN FULL THICKNESS GRAFT Bilateral 10/14/2021   Procedure: PLATYSMA FLAP CLOSURE;  Surgeon: Christia Reading, MD;  Location: [facility] OR;  Service: ENT;  Laterality: Bilateral;   TOOTH EXTRACTION  10/14/2021   Procedure: DENTAL EXTRACTIONS;  Surgeon: Christia Reading, MD;  Location[facility];  Service: ENT;;   TRACHEOSTOMY TUBE PLACEMENT N/A 10/14/2021   Procedure: TRACHEOSTOMY;  Surgeon: Christia Reading, MD;  Location: [facility] OR;  Service: ENT;  Laterality: N/A;    Social History: Social History   Socioeconomic History   Marital status: Single    Spouse name: Not on file   Number of children: Not on file   Years of education: Not on file   Highest education level: Not on file  Occupational History   Not on file  Tobacco Use   Smoking status: Former    Current packs/day: 0.25    Average packs/day: 0.3 packs/day for 50.0 years (12.5 ttl pk-yrs)    Types: Cigarettes   Smokeless tobacco: Never  Vaping Use   Vaping status: Never Used  Substance and Sexual Activity   Alcohol use:  Not Currently   Drug use: No   Sexual activity: Yes    Birth control/protection: None  Other Topics Concern   Not on file  Social History Narrative   Not on file   Social Determinants of Health   Financial Resource Strain: Not on file  Food Insecurity: Not on file  Transportation Needs: Not on file  Physical Activity: Not on file  Stress: Not on file  Social Connections: Not on file  Intimate Partner Violence: Not on file    Family History: Family History  Problem Relation Age of Onset   Cancer Brother    Cancer Brother     Current Medications:  Current Outpatient Medications:    acetaminophen (TYLENOL) 650 MG CR tablet, Take 650 mg by mouth every 8 (eight) hours as needed for pain., Disp: , Rfl:    amLODipine-olmesartan (AZOR) 5-20 MG tablet, TAKE 1 TABLET BY MOUTH DAILY, Disp: 90 tablet, Rfl: 0   budesonide-formoterol (SYMBICORT) 80-4.5 MCG/ACT inhaler, Inhale 2 puffs into the lungs in the morning and at bedtime., Disp: 1 each, Rfl: 12   Cholecalciferol (VITAMIN D3) 50 MCG (2000 UT) TABS, Take 2,000 Units by mouth daily., Disp: , Rfl:    doxycycline (VIBRA-TABS) 100 MG tablet, Take 1 tablet (100 mg total) by mouth 2 (two) times daily., Disp: 60 tablet, Rfl: 3   HYDROcodone-acetaminophen (HYCET) 7.5-325 mg/15 ml solution, Take 15 mLs by mouth every 6 (six) hours as needed for moderate pain., Disp: 473 mL, Rfl: 0   lidocaine (XYLOCAINE) 2 % solution, Take by mouth., Disp: , Rfl:    magnesium oxide (MAG-OX) 400 MG tablet, Take 400 mg by mouth daily., Disp: , Rfl:    Menthol, Topical Analgesic, (BIOFREEZE EX), Apply 1 application  topically daily as needed (pain)., Disp: , Rfl:    naloxone (NARCAN) nasal spray 4 mg/0.1 mL, SMARTSIG:Both Nares, Disp: , Rfl:    PEMBROLIZUMAB IV, Inject into the vein every 21 ( twenty-one) days., Disp: , Rfl:    Polyethyl Glycol-Propyl Glycol (GOODSENSE LUBRICANT EYE DROPS OP), Place 1 drop  into both eyes daily as needed (dry eyes)., Disp: , Rfl:     pravastatin (PRAVACHOL) 80 MG tablet, Take 80 mg by mouth daily., Disp: , Rfl:    predniSONE (DELTASONE) 10 MG tablet, Take  4 each am x 2 days,   2 each am x 2 days,  1 each am x 2 days and stop, Disp: 14 tablet, Rfl: 0   RAPAFLO 8 MG CAPS capsule, Take 8 mg by mouth daily. Silodosin, Disp: , Rfl:    vitamin B-12 (CYANOCOBALAMIN) 500 MCG tablet, Take 500 mcg by mouth daily., Disp: , Rfl:  No current facility-administered medications for this visit.  Facility-Administered Medications Ordered in Other Visits:    sodium chloride flush (NS) 0.9 % injection 10 mL, 10 mL, Intracatheter, PRN, Nathaniel Massed, MD, 10 mL at 10/15/22 1225   Allergies: No Known Allergies  REVIEW OF SYSTEMS:   Review of Systems  Constitutional:  Negative for chills, fatigue and fever.  HENT:   Negative for lump/mass, mouth sores, nosebleeds, sore throat and trouble swallowing.   Eyes:  Negative for eye problems.  Respiratory:  Negative for cough and shortness of breath.   Cardiovascular:  Negative for chest pain, leg swelling and palpitations.  Gastrointestinal:  Negative for abdominal pain, constipation, diarrhea, nausea and vomiting.  Genitourinary:  Negative for bladder incontinence, difficulty urinating, dysuria, frequency, hematuria and nocturia.   Musculoskeletal:  Positive for arthralgias (in bilateral shoulders, 9/10 severity) and neck pain (9/10 severity). Negative for back pain, flank pain and myalgias.  Skin:  Negative for itching and rash.  Neurological:  Negative for dizziness, headaches and numbness.  Hematological:  Does not bruise/bleed easily.  Psychiatric/Behavioral:  Negative for depression, sleep disturbance and suicidal ideas. The patient is not nervous/anxious.   All other systems reviewed and are negative.    VITALS:   Blood pressure (!) 98/43.  Wt Readings from Last 3 Encounters:  10/15/22 145 lb 9.6 oz (66 kg)  10/07/22 147 lb 6.4 oz (66.9 kg)  09/22/22 148 lb 3.2 oz (67.2  kg)    There is no height or weight on file to calculate BMI.  Performance status (ECOG): 1 - Symptomatic but completely ambulatory  PHYSICAL EXAM:   Physical Exam Vitals and nursing note reviewed. Exam conducted with a chaperone present.  Constitutional:      Appearance: Normal appearance.  Cardiovascular:     Rate and Rhythm: Normal rate and regular rhythm.     Pulses: Normal pulses.     Heart sounds: Normal heart sounds.  Pulmonary:     Effort: Pulmonary effort is normal.     Breath sounds: Normal breath sounds.  Abdominal:     Palpations: Abdomen is soft. There is no hepatomegaly, splenomegaly or mass.     Tenderness: There is no abdominal tenderness.  Musculoskeletal:     Right lower leg: No edema.     Left lower leg: No edema.  Lymphadenopathy:     Cervical: No cervical adenopathy.     Right cervical: No superficial, deep or posterior cervical adenopathy.    Left cervical: No superficial, deep or posterior cervical adenopathy.     Upper Body:     Right upper body: No supraclavicular or axillary adenopathy.     Left upper body: No supraclavicular or axillary adenopathy.  Neurological:     General: No focal deficit present.     Mental Status: He is alert and oriented to person, place, and time.  Psychiatric:  Mood and Affect: Mood normal.        Behavior: Behavior normal.     LABS:      Latest Ref Rng & Units 10/15/2022    7:47 AM 10/07/2022    9:03 AM 09/15/2022    1:04 PM  CBC  WBC 4.0 - 10.5 K/uL 6.1  6.0  8.4   Hemoglobin 13.0 - 17.0 g/dL 60.4  9.5  9.4   Hematocrit 39.0 - 52.0 % 31.8  30.0  30.1   Platelets 150 - 400 K/uL 300  268  356       Latest Ref Rng & Units 10/15/2022    7:47 AM 10/07/2022    9:03 AM 09/15/2022    1:04 PM  CMP  Glucose 70 - 99 mg/dL 540  981  191   BUN 8 - 23 mg/dL 25  21  27    Creatinine 0.61 - 1.24 mg/dL 4.78  2.95  6.21   Sodium 135 - 145 mmol/L 134  135  135   Potassium 3.5 - 5.1 mmol/L 4.7  4.5  4.3   Chloride 98 -  111 mmol/L 100  100  103   CO2 22 - 32 mmol/L 27  28  26    Calcium 8.9 - 10.3 mg/dL 30.8  65.7  9.8   Total Protein 6.5 - 8.1 g/dL 7.5  7.2  7.2   Total Bilirubin 0.3 - 1.2 mg/dL 0.4  0.6  0.5   Alkaline Phos 38 - 126 U/L 51  48  50   AST 15 - 41 U/L 15  14  15    ALT 0 - 44 U/L 14  11  15       Lab Results  Component Value Date   CEA 2.2 01/19/2013   /  CEA  Date Value Ref Range Status  01/19/2013 2.2 0.0 - 5.0 ng/mL Final    Comment:    Performed at [facility]   No results found for: "PSA1" No results found for: "CAN199" No results found for: "CAN125"  No results found for: "TOTALPROTELP", "ALBUMINELP", "A1GS", "A2GS", "BETS", "BETA2SER", "GAMS", "MSPIKE", "SPEI" Lab Results  Component Value Date   TIBC 244 (L) 08/04/2022   TIBC 251 05/12/2022   FERRITIN 273 08/04/2022   FERRITIN 195 05/12/2022   IRONPCTSAT 19 08/04/2022   IRONPCTSAT 18 05/12/2022   Lab Results  Component Value Date   LDH 172 11/12/2020   LDH 175 10/26/2018   LDH 179 10/25/2013     STUDIES:   No results found.

## 2022-10-15 ENCOUNTER — Inpatient Hospital Stay: Payer: 59 | Attending: Hematology | Admitting: Hematology

## 2022-10-15 ENCOUNTER — Inpatient Hospital Stay: Payer: 59

## 2022-10-15 VITALS — BP 98/43

## 2022-10-15 VITALS — BP 117/69 | HR 88 | Temp 97.5°F | Resp 18

## 2022-10-15 DIAGNOSIS — Z5111 Encounter for antineoplastic chemotherapy: Secondary | ICD-10-CM | POA: Diagnosis not present

## 2022-10-15 DIAGNOSIS — C049 Malignant neoplasm of floor of mouth, unspecified: Secondary | ICD-10-CM

## 2022-10-15 DIAGNOSIS — I959 Hypotension, unspecified: Secondary | ICD-10-CM | POA: Insufficient documentation

## 2022-10-15 DIAGNOSIS — D630 Anemia in neoplastic disease: Secondary | ICD-10-CM | POA: Insufficient documentation

## 2022-10-15 DIAGNOSIS — C770 Secondary and unspecified malignant neoplasm of lymph nodes of head, face and neck: Secondary | ICD-10-CM | POA: Diagnosis not present

## 2022-10-15 DIAGNOSIS — Z8547 Personal history of malignant neoplasm of testis: Secondary | ICD-10-CM | POA: Insufficient documentation

## 2022-10-15 DIAGNOSIS — Z79899 Other long term (current) drug therapy: Secondary | ICD-10-CM | POA: Insufficient documentation

## 2022-10-15 DIAGNOSIS — Z87891 Personal history of nicotine dependence: Secondary | ICD-10-CM | POA: Diagnosis not present

## 2022-10-15 DIAGNOSIS — C787 Secondary malignant neoplasm of liver and intrahepatic bile duct: Secondary | ICD-10-CM | POA: Diagnosis not present

## 2022-10-15 LAB — CBC WITH DIFFERENTIAL/PLATELET
Abs Immature Granulocytes: 0.01 10*3/uL (ref 0.00–0.07)
Basophils Absolute: 0.1 10*3/uL (ref 0.0–0.1)
Basophils Relative: 1 %
Eosinophils Absolute: 0.4 10*3/uL (ref 0.0–0.5)
Eosinophils Relative: 7 %
HCT: 31.8 % — ABNORMAL LOW (ref 39.0–52.0)
Hemoglobin: 10.2 g/dL — ABNORMAL LOW (ref 13.0–17.0)
Immature Granulocytes: 0 %
Lymphocytes Relative: 19 %
Lymphs Abs: 1.2 10*3/uL (ref 0.7–4.0)
MCH: 32.8 pg (ref 26.0–34.0)
MCHC: 32.1 g/dL (ref 30.0–36.0)
MCV: 102.3 fL — ABNORMAL HIGH (ref 80.0–100.0)
Monocytes Absolute: 0.6 10*3/uL (ref 0.1–1.0)
Monocytes Relative: 10 %
Neutro Abs: 3.9 10*3/uL (ref 1.7–7.7)
Neutrophils Relative %: 63 %
Platelets: 300 10*3/uL (ref 150–400)
RBC: 3.11 MIL/uL — ABNORMAL LOW (ref 4.22–5.81)
RDW: 13.7 % (ref 11.5–15.5)
WBC: 6.1 10*3/uL (ref 4.0–10.5)
nRBC: 0 % (ref 0.0–0.2)

## 2022-10-15 LAB — COMPREHENSIVE METABOLIC PANEL
ALT: 14 U/L (ref 0–44)
AST: 15 U/L (ref 15–41)
Albumin: 3.6 g/dL (ref 3.5–5.0)
Alkaline Phosphatase: 51 U/L (ref 38–126)
Anion gap: 7 (ref 5–15)
BUN: 25 mg/dL — ABNORMAL HIGH (ref 8–23)
CO2: 27 mmol/L (ref 22–32)
Calcium: 10.2 mg/dL (ref 8.9–10.3)
Chloride: 100 mmol/L (ref 98–111)
Creatinine, Ser: 1.16 mg/dL (ref 0.61–1.24)
GFR, Estimated: >60 mL/min (ref >60)
Glucose, Bld: 118 mg/dL — ABNORMAL HIGH (ref 70–99)
Potassium: 4.7 mmol/L (ref 3.5–5.1)
Sodium: 134 mmol/L — ABNORMAL LOW (ref 135–145)
Total Bilirubin: 0.4 mg/dL (ref 0.3–1.2)
Total Protein: 7.5 g/dL (ref 6.5–8.1)

## 2022-10-15 LAB — MAGNESIUM: Magnesium: 2.2 mg/dL (ref 1.7–2.4)

## 2022-10-15 MED ORDER — SODIUM CHLORIDE 0.9 % IV SOLN: Freq: Once | INTRAVENOUS | Status: AC

## 2022-10-15 MED ORDER — SODIUM CHLORIDE 0.9 % IV SOLN
10.0000 mg | Freq: Once | INTRAVENOUS | Status: AC
  Administered 2022-10-15: 10 mg via INTRAVENOUS
  Filled 2022-10-15: qty 10

## 2022-10-15 MED ORDER — FAMOTIDINE IN NACL 20-0.9 MG/50ML-% IV SOLN
20.0000 mg | Freq: Once | INTRAVENOUS | Status: AC
  Administered 2022-10-15: 20 mg via INTRAVENOUS
  Filled 2022-10-15: qty 50

## 2022-10-15 MED ORDER — CETUXIMAB CHEMO IV INJECTION 200 MG/100ML
250.0000 mg/m2 | Freq: Once | INTRAVENOUS | Status: AC
  Administered 2022-10-15: 500 mg via INTRAVENOUS
  Filled 2022-10-15: qty 200

## 2022-10-15 MED ORDER — DIPHENHYDRAMINE HCL 50 MG/ML IJ SOLN
50.0000 mg | Freq: Once | INTRAMUSCULAR | Status: AC
  Administered 2022-10-15: 50 mg via INTRAVENOUS
  Filled 2022-10-15: qty 1

## 2022-10-15 MED ORDER — ALBUTEROL SULFATE (2.5 MG/3ML) 0.083% IN NEBU
2.5000 mg | INHALATION_SOLUTION | Freq: Once | RESPIRATORY_TRACT | Status: AC
  Administered 2022-10-15: 2.5 mg via RESPIRATORY_TRACT
  Filled 2022-10-15: qty 3

## 2022-10-15 MED ORDER — SODIUM CHLORIDE 0.9% FLUSH
10.0000 mL | INTRAVENOUS | Status: DC | PRN
  Administered 2022-10-15: 10 mL

## 2022-10-15 MED ORDER — HEPARIN SOD (PORK) LOCK FLUSH 100 UNIT/ML IV SOLN
500.0000 [IU] | Freq: Once | INTRAVENOUS | Status: AC | PRN
  Administered 2022-10-15: 500 [IU]

## 2022-10-15 MED ORDER — METHYLPREDNISOLONE SODIUM SUCC 125 MG IJ SOLR
125.0000 mg | Freq: Once | INTRAMUSCULAR | Status: AC
  Administered 2022-10-15: 125 mg via INTRAVENOUS
  Filled 2022-10-15: qty 2

## 2022-10-15 NOTE — Progress Notes (Signed)
Patient has been examined by Dr. Katragadda. Vital signs and labs have been reviewed by MD - ANC, Creatinine, LFTs, hemoglobin, and platelets are within treatment parameters per M.D. - pt may proceed with treatment.  Primary RN and pharmacy notified.  

## 2022-10-15 NOTE — Patient Instructions (Signed)
[facility]  Discharge Instructions: Thank you for choosing [facility] to provide your oncology and hematology care.  If you have a lab appointment with the Cancer Center - please note that after April 8th, 2024, all labs will be drawn in the cancer center.  You do not have to check in or register with the main entrance as you have in the past but will complete your check-in in the cancer center.  Wear comfortable clothing and clothing appropriate for easy access to any Portacath or PICC line.   We strive to give you quality time with your provider. You may need to reschedule your appointment if you arrive late (15 or more minutes).  Arriving late affects you and other patients whose appointments are after yours.  Also, if you miss three or more appointments without notifying the office, you may be dismissed from the clinic at the provider's discretion.      For prescription refill requests, have your pharmacy contact our office and allow 72 hours for refills to be completed.    Today you received the following chemotherapy and/or immunotherapy agents erbitux      To help prevent nausea and vomiting after your treatment, we encourage you to take your nausea medication as directed.  BELOW ARE SYMPTOMS THAT SHOULD BE REPORTED IMMEDIATELY: *FEVER GREATER THAN 100.4 F (38 C) OR HIGHER *CHILLS OR SWEATING *NAUSEA AND VOMITING THAT IS NOT CONTROLLED WITH YOUR NAUSEA MEDICATION *UNUSUAL SHORTNESS OF BREATH *UNUSUAL BRUISING OR BLEEDING *URINARY PROBLEMS (pain or burning when urinating, or frequent urination) *BOWEL PROBLEMS (unusual diarrhea, constipation, pain near the anus) TENDERNESS IN MOUTH AND THROAT WITH OR WITHOUT PRESENCE OF ULCERS (sore throat, sores in mouth, or a toothache) UNUSUAL RASH, SWELLING OR PAIN  UNUSUAL VAGINAL DISCHARGE OR ITCHING   Items with * indicate a potential emergency and should be followed up as soon as possible or go to the  Emergency Department if any problems should occur.  Please show the CHEMOTHERAPY ALERT CARD or IMMUNOTHERAPY ALERT CARD at check-in to the Emergency Department and triage nurse.  Should you have questions after your visit or need to cancel or reschedule your appointment, please contact [facility] [phone]  and follow the prompts.  Office hours are 8:00 a.m. to 4:30 p.m. Monday - Friday. Please note that voicemails left after 4:00 p.m. may not be returned until the following business day.  We are closed weekends and major holidays. You have access to a nurse at all times for urgent questions. Please call the main number to the clinic [phone] and follow the prompts.  For any non-urgent questions, you may also contact your provider using MyChart. We now offer e-Visits for anyone 75 and older to request care online for non-urgent symptoms. For details visit [email or web address].   Also download the MyChart app! Go to the app store, search "MyChart", open the app, select [place], and log in with your MyChart username and password.

## 2022-10-15 NOTE — Progress Notes (Signed)
In pharmacy communication from Dr Ellin Saba.    Albuterol added per above.  Pryor Ochoa, PharmD

## 2022-10-15 NOTE — Progress Notes (Signed)
Patient tolerated chemotherapy with no complaints voiced.  Side effects with management reviewed with understanding verbalized.  Port site clean and dry with no bruising or swelling noted at site.  Good blood return noted before and after administration of chemotherapy.  Band aid applied.  Patient left in satisfactory condition with VSS and no s/s of distress noted.   

## 2022-10-15 NOTE — Patient Instructions (Addendum)
[facility] at [facility] Discharge Instructions   You were seen and examined today by Dr. Ellin Saba.  He reviewed the results of your lab work which are normal/stable.   We will proceed with your treatment today.   Hold your amlodipine (blood pressure medication) until we tell you to restart it.   Return as scheduled.    Thank you for choosing [facility] at [facility] to provide your oncology and hematology care.  To afford each patient quality time with our provider, please arrive at least 15 minutes before your scheduled appointment time.   If you have a lab appointment with the Cancer Center please come in thru the Main Entrance and check in at the main information desk.  You need to re-schedule your appointment should you arrive 10 or more minutes late.  We strive to give you quality time with our providers, and arriving late affects you and other patients whose appointments are after yours.  Also, if you no show three or more times for appointments you may be dismissed from the clinic at the providers discretion.     Again, thank you for choosing [facility].  Our hope is that these requests will decrease the amount of time that you wait before being seen by our physicians.       _____________________________________________________________  Should you have questions after your visit to [facility], please contact our office at [phone] and follow the prompts.  Our office hours are 8:00 a.m. and 4:30 p.m. Monday - Friday.  Please note that voicemails left after 4:00 p.m. may not be returned until the following business day.  We are closed weekends and major holidays.  You do have access to a nurse 24-7, just call the main number to the clinic [phone] and do not press any options, hold on the line and a nurse will answer the phone.    For prescription refill requests, have your pharmacy contact our  office and allow 72 hours.    Due to Covid, you will need to wear a mask upon entering the hospital. If you do not have a mask, a mask will be given to you at the Main Entrance upon arrival. For doctor visits, patients may have 1 support person age 56 or older with them. For treatment visits, patients can not have anyone with them due to social distancing guidelines and our immunocompromised population.

## 2022-10-16 ENCOUNTER — Other Ambulatory Visit: Payer: Self-pay

## 2022-10-19 ENCOUNTER — Other Ambulatory Visit: Payer: Self-pay | Admitting: *Deleted

## 2022-10-19 ENCOUNTER — Telehealth: Payer: Self-pay | Admitting: *Deleted

## 2022-10-19 DIAGNOSIS — R21 Rash and other nonspecific skin eruption: Secondary | ICD-10-CM

## 2022-10-19 MED ORDER — CLINDAMYCIN PHOSPHATE 1 % EX GEL
Freq: Two times a day (BID) | CUTANEOUS | 0 refills | Status: DC
Start: 2022-10-19 — End: 2022-10-26

## 2022-10-19 NOTE — Telephone Encounter (Signed)
Received call from sister Nathaniel Hicks advising that patient woke up this morning with rash to chest.  Per Dr. Ellin Saba, clindamycin gel sent to [facility] for him to apply BID.  Spoke to patient with instructions and confirmed that he is taking doxycycline as prescribed.  Verbalized understanding of prescription.

## 2022-10-21 ENCOUNTER — Inpatient Hospital Stay: Payer: 59

## 2022-10-21 ENCOUNTER — Inpatient Hospital Stay: Payer: 59 | Admitting: Hematology

## 2022-10-21 VITALS — BP 136/94 | HR 64 | Temp 98.8°F | Resp 18

## 2022-10-21 DIAGNOSIS — C049 Malignant neoplasm of floor of mouth, unspecified: Secondary | ICD-10-CM

## 2022-10-21 DIAGNOSIS — Z5111 Encounter for antineoplastic chemotherapy: Secondary | ICD-10-CM | POA: Diagnosis not present

## 2022-10-21 DIAGNOSIS — C770 Secondary and unspecified malignant neoplasm of lymph nodes of head, face and neck: Secondary | ICD-10-CM | POA: Diagnosis not present

## 2022-10-21 DIAGNOSIS — C787 Secondary malignant neoplasm of liver and intrahepatic bile duct: Secondary | ICD-10-CM | POA: Diagnosis not present

## 2022-10-21 DIAGNOSIS — Z87891 Personal history of nicotine dependence: Secondary | ICD-10-CM | POA: Diagnosis not present

## 2022-10-21 DIAGNOSIS — I959 Hypotension, unspecified: Secondary | ICD-10-CM | POA: Diagnosis not present

## 2022-10-21 DIAGNOSIS — Z79899 Other long term (current) drug therapy: Secondary | ICD-10-CM | POA: Diagnosis not present

## 2022-10-21 DIAGNOSIS — D630 Anemia in neoplastic disease: Secondary | ICD-10-CM | POA: Diagnosis not present

## 2022-10-21 LAB — CBC WITH DIFFERENTIAL/PLATELET
Abs Immature Granulocytes: 0.02 10*3/uL (ref 0.00–0.07)
Basophils Absolute: 0.1 10*3/uL (ref 0.0–0.1)
Basophils Relative: 1 %
Eosinophils Absolute: 0.3 10*3/uL (ref 0.0–0.5)
Eosinophils Relative: 5 %
HCT: 31 % — ABNORMAL LOW (ref 39.0–52.0)
Hemoglobin: 9.8 g/dL — ABNORMAL LOW (ref 13.0–17.0)
Immature Granulocytes: 0 %
Lymphocytes Relative: 18 %
Lymphs Abs: 1 10*3/uL (ref 0.7–4.0)
MCH: 32.7 pg (ref 26.0–34.0)
MCHC: 31.6 g/dL (ref 30.0–36.0)
MCV: 103.3 fL — ABNORMAL HIGH (ref 80.0–100.0)
Monocytes Absolute: 0.6 10*3/uL (ref 0.1–1.0)
Monocytes Relative: 10 %
Neutro Abs: 3.7 10*3/uL (ref 1.7–7.7)
Neutrophils Relative %: 66 %
Platelets: 264 10*3/uL (ref 150–400)
RBC: 3 MIL/uL — ABNORMAL LOW (ref 4.22–5.81)
RDW: 13.7 % (ref 11.5–15.5)
WBC: 5.6 10*3/uL (ref 4.0–10.5)
nRBC: 0 % (ref 0.0–0.2)

## 2022-10-21 LAB — COMPREHENSIVE METABOLIC PANEL
ALT: 14 U/L (ref 0–44)
AST: 14 U/L — ABNORMAL LOW (ref 15–41)
Albumin: 3.3 g/dL — ABNORMAL LOW (ref 3.5–5.0)
Alkaline Phosphatase: 51 U/L (ref 38–126)
Anion gap: 11 (ref 5–15)
BUN: 12 mg/dL (ref 8–23)
CO2: 22 mmol/L (ref 22–32)
Calcium: 9.5 mg/dL (ref 8.9–10.3)
Chloride: 102 mmol/L (ref 98–111)
Creatinine, Ser: 0.7 mg/dL (ref 0.61–1.24)
GFR, Estimated: >60 mL/min (ref >60)
Glucose, Bld: 102 mg/dL — ABNORMAL HIGH (ref 70–99)
Potassium: 4 mmol/L (ref 3.5–5.1)
Sodium: 135 mmol/L (ref 135–145)
Total Bilirubin: 0.5 mg/dL (ref 0.3–1.2)
Total Protein: 6.8 g/dL (ref 6.5–8.1)

## 2022-10-21 LAB — MAGNESIUM: Magnesium: 1.7 mg/dL (ref 1.7–2.4)

## 2022-10-21 MED ORDER — DIPHENHYDRAMINE HCL 50 MG/ML IJ SOLN
50.0000 mg | Freq: Once | INTRAMUSCULAR | Status: AC
  Administered 2022-10-21: 50 mg via INTRAVENOUS
  Filled 2022-10-21: qty 1

## 2022-10-21 MED ORDER — SODIUM CHLORIDE 0.9% FLUSH
10.0000 mL | Freq: Once | INTRAVENOUS | Status: AC
  Administered 2022-10-21: 10 mL via INTRAVENOUS

## 2022-10-21 MED ORDER — SODIUM CHLORIDE 0.9 % IV SOLN: Freq: Once | INTRAVENOUS | Status: AC

## 2022-10-21 MED ORDER — FAMOTIDINE IN NACL 20-0.9 MG/50ML-% IV SOLN
20.0000 mg | Freq: Once | INTRAVENOUS | Status: AC
  Administered 2022-10-21: 20 mg via INTRAVENOUS
  Filled 2022-10-21: qty 50

## 2022-10-21 MED ORDER — METHYLPREDNISOLONE SODIUM SUCC 125 MG IJ SOLR
125.0000 mg | Freq: Once | INTRAMUSCULAR | Status: AC
  Administered 2022-10-21: 125 mg via INTRAVENOUS
  Filled 2022-10-21: qty 2

## 2022-10-21 MED ORDER — SODIUM CHLORIDE 0.9% FLUSH
10.0000 mL | INTRAVENOUS | Status: DC | PRN
  Administered 2022-10-21: 10 mL

## 2022-10-21 MED ORDER — CETUXIMAB CHEMO IV INJECTION 200 MG/100ML
250.0000 mg/m2 | Freq: Once | INTRAVENOUS | Status: AC
  Administered 2022-10-21: 500 mg via INTRAVENOUS
  Filled 2022-10-21: qty 200

## 2022-10-21 MED ORDER — HEPARIN SOD (PORK) LOCK FLUSH 100 UNIT/ML IV SOLN
500.0000 [IU] | Freq: Once | INTRAVENOUS | Status: AC | PRN
  Administered 2022-10-21: 500 [IU]

## 2022-10-21 NOTE — Patient Instructions (Signed)
[facility]  Discharge Instructions: Thank you for choosing [facility] to provide your oncology and hematology care.  If you have a lab appointment with the Cancer Center - please note that after April 8th, 2024, all labs will be drawn in the cancer center.  You do not have to check in or register with the main entrance as you have in the past but will complete your check-in in the cancer center.  Wear comfortable clothing and clothing appropriate for easy access to any Portacath or PICC line.   We strive to give you quality time with your provider. You may need to reschedule your appointment if you arrive late (15 or more minutes).  Arriving late affects you and other patients whose appointments are after yours.  Also, if you miss three or more appointments without notifying the office, you may be dismissed from the clinic at the provider's discretion.      For prescription refill requests, have your pharmacy contact our office and allow 72 hours for refills to be completed.    Today you received the following chemotherapy and/or immunotherapy agents Erbitux   To help prevent nausea and vomiting after your treatment, we encourage you to take your nausea medication as directed.  Cetuximab Injection What is this medication? CETUXIMAB (se TUX i mab) treats head and neck cancer. It may also be used to treat colorectal cancer. It works by blocking a protein that causes cancer cells to grow and multiply. This helps to slow or stop the spread of cancer cells. It is a monoclonal antibody. This medicine may be used for other purposes; ask your health care provider or pharmacist if you have questions. COMMON BRAND NAME(S): Erbitux What should I tell my care team before I take this medication? They need to know if you have any of these conditions: Heart disease History of tick bites Low levels of calcium, magnesium, or potassium in the blood Lung disease Red meat  allergy An unusual or allergic reaction to cetuximab, other medications, foods, dyes, or preservatives Pregnant or trying to get pregnant Breast-feeding How should I use this medication? This medication is infused into a vein. It is given by your care team in a hospital or clinic setting. Talk to your care team about the use of this medication in children. Special care may be needed. Overdosage: If you think you have taken too much of this medicine contact a poison control center or emergency room at once. NOTE: This medicine is only for you. Do not share this medicine with others. What if I miss a dose? Keep appointments for follow-up doses. It is important not to miss your dose. Call your care team if you are unable to keep an appointment. What may interact with this medication? Interactions are not expected. This list may not describe all possible interactions. Give your health care provider a list of all the medicines, herbs, non-prescription drugs, or dietary supplements you use. Also tell them if you smoke, drink alcohol, or use illegal drugs. Some items may interact with your medicine. What should I watch for while using this medication? Visit your care team for regular checks on your progress. This medication may make you feel generally unwell. This is not uncommon, as chemotherapy can affect healthy cells as well as cancer cells. Report any side effects. Continue your course of treatment even though you feel ill unless your care team tells you to stop. You may need blood work done while  you are taking this medication. This medication can make you more sensitive to the sun. Keep out of the sun while taking this medication and for 2 months after the last dose. If you cannot avoid being in the sun, wear protective clothing and sunscreen. Do not use sun lamps, tanning beds, or tanning booths. This medication can cause serious infusion reactions. To reduce the risk, your care team may give you  other medications to take before receiving this one. Be sure to follow the directions of your care team. This medication may cause serious skin reactions. They can happen weeks to months after starting the medication. Contact your care team right away if you notice fevers or flu-like symptoms with a rash. The rash may be red or purple and then turn into blisters or peeling of the skin. You may also notice a red rash with swelling of the face, lips, or lymph nodes in your neck or under your arms. Talk to your care team if you may be pregnant. Serious birth defects can occur if you take this medication during pregnancy and for 2 months after the last dose. You will need a negative pregnancy test before starting this medication. Contraception is recommended while taking this medication and for 2 months after the last dose. Your care team can help you find the option that works for you. Do not breastfeed while taking this medication and for 2 months after the last dose. This medication may cause infertility. Talk to your care team if you are concerned about your fertility. What side effects may I notice from receiving this medication? Side effects that you should report to your care team as soon as possible: Allergic reactions--skin rash, itching, hives, swelling of the face, lips, tongue, or throat Dry cough, shortness of breath or trouble breathing Heart attack--pain or tightness in the chest, shoulders, arms, or jaw, nausea, shortness of breath, cold or clammy skin, feeling faint or lightheaded Infusion reactions--chest pain, shortness of breath or trouble breathing, feeling faint or lightheaded Low calcium level--muscle pain or cramps, confusion, tingling, or numbness in the hands or feet Low magnesium level--muscle pain or cramps, unusual weakness or fatigue, fast or irregular heartbeat, tremors Low potassium level--muscle pain or cramps, unusual weakness or fatigue, fast or irregular heartbeat,  constipation Redness, blistering, peeling, or loosening of the skin, including inside the mouth Side effects that usually do not require medical attention (report to your care team if they continue or are bothersome): Diarrhea Headache Joint pain Nausea Unusual weakness or fatigue Weight loss This list may not describe all possible side effects. Call your doctor for medical advice about side effects. You may report side effects to FDA at 1-800-FDA-1088. Where should I keep my medication? This medication is given in a hospital or clinic. It will not be stored at home. NOTE: This sheet is a summary. It may not cover all possible information. If you have questions about this medicine, talk to your doctor, pharmacist, or health care provider.  2024 Elsevier/Gold Standard (2021-05-16 00:00:00)   BELOW ARE SYMPTOMS THAT SHOULD BE REPORTED IMMEDIATELY: *FEVER GREATER THAN 100.4 F (38 C) OR HIGHER *CHILLS OR SWEATING *NAUSEA AND VOMITING THAT IS NOT CONTROLLED WITH YOUR NAUSEA MEDICATION *UNUSUAL SHORTNESS OF BREATH *UNUSUAL BRUISING OR BLEEDING *URINARY PROBLEMS (pain or burning when urinating, or frequent urination) *BOWEL PROBLEMS (unusual diarrhea, constipation, pain near the anus) TENDERNESS IN MOUTH AND THROAT WITH OR WITHOUT PRESENCE OF ULCERS (sore throat, sores in mouth, or a toothache) UNUSUAL RASH,  SWELLING OR PAIN  UNUSUAL VAGINAL DISCHARGE OR ITCHING   Items with * indicate a potential emergency and should be followed up as soon as possible or go to the Emergency Department if any problems should occur.  Please show the CHEMOTHERAPY ALERT CARD or IMMUNOTHERAPY ALERT CARD at check-in to the Emergency Department and triage nurse.  Should you have questions after your visit or need to cancel or reschedule your appointment, please contact [facility] [phone]  and follow the prompts.  Office hours are 8:00 a.m. to 4:30 p.m. Monday - Friday. Please note  that voicemails left after 4:00 p.m. may not be returned until the following business day.  We are closed weekends and major holidays. You have access to a nurse at all times for urgent questions. Please call the main number to the clinic [phone] and follow the prompts.  For any non-urgent questions, you may also contact your provider using MyChart. We now offer e-Visits for anyone 61 and older to request care online for non-urgent symptoms. For details visit [email or web address].   Also download the MyChart app! Go to the app store, search "MyChart", open the app, select [place], and log in with your MyChart username and password.

## 2022-10-21 NOTE — Progress Notes (Signed)
Patient presents today Erbitux infusion. Patient is in satisfactory condition with no new complaints voiced.  Vital signs are stable.  Labs reviewed and all labs are within treatment parameters.  We will proceed with treatment per MD orders.    Treatment given today per MD orders. Tolerated infusion without adverse affects. Vital signs stable. No complaints at this time. Discharged from clinic ambulatory with cane in stable condition. Alert and oriented x 3. F/U with [facility] as scheduled.

## 2022-10-21 NOTE — Progress Notes (Signed)
Ok to proceed with SM as steroid premedication per MD.  Richardean Sale, RPH, BCPS, BCOP 10/21/2022 9:36 AM

## 2022-10-26 ENCOUNTER — Telehealth: Payer: Self-pay | Admitting: *Deleted

## 2022-10-26 ENCOUNTER — Other Ambulatory Visit: Payer: Self-pay | Admitting: *Deleted

## 2022-10-26 DIAGNOSIS — R21 Rash and other nonspecific skin eruption: Secondary | ICD-10-CM

## 2022-10-26 MED ORDER — CLINDAMYCIN PHOSPHATE 1 % EX GEL: Freq: Two times a day (BID) | CUTANEOUS | 0 refills | Status: DC

## 2022-10-26 NOTE — Telephone Encounter (Signed)
Patient called to advise that he now has rash on chest and face.  Per Dr. Ellin Saba, he is to continue using clina gel in both areas.  Patient made aware and verbalized understanding.

## 2022-10-28 NOTE — Progress Notes (Signed)
[facility] [address] [place] [address]    Clinic Day:  10/29/2022  Referring physician: [name], MD  Patient Care Team: Nathaniel Stabile, MD as PCP - General (Internal Medicine) Nathaniel Askew, MD as Consulting Physician (Urology) Nathaniel Massed, MD as Medical Oncologist (Medical Oncology) Nathaniel Sarah, RN as Oncology Nurse Navigator (Medical Oncology)   ASSESSMENT & PLAN:   Assessment: 1.  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  Enlarged contralateral right submandibular node.  Enlargement of the left submandibular gland probably reactive.  No abscess. - Biopsy (07/18/2021) floor of the mouth: Invasive well to moderately differentiated keratinizing squamous cell carcinoma.  Tumor cells negative for p16. - 10/14/2021: Floor of the mouth resection, tracheostomy, bilateral selective neck dissections zones 1-3 by Dr. Jenne Hicks - Pathology: Invasive moderately differentiated keratinizing SCC, 4.1 cm, carcinoma invades for a depth of about 1.9 cm and involves saliva gland tissue.  Anterior, posterior, right, left resection margins are involved.  Deep resection margin is negative.  Metastatic carcinoma 1/3 level 1 lymph nodes.  3 level 2 and 3 level 3 lymph nodes negative for carcinoma.  0/10 lymph nodes involved in the left side neck zone 1, 2, 3 dissection.  ENE not identified.  LVI/perineural invasion not identified. - I have talked to pathologist.  Reexcision margins were considered negative.  He has a high risk feature which is T4 tumor. -  PET scan on 12/12/2021: Soft tissue thickening and calcification along the ventral aspect of the trachea with associated hypermetabolism corresponding to recent tracheostomy site.  7 mm  right paratracheal lymph node new with SUV 2.4.  Hypermetabolism along the dome of the right hepatic lobe with probable 2.4 cm low-attenuation lesion - Liver lesion biopsy (01/28/2022): Metastatic squamous cell carcinoma, keratinizing. - NGS testing: PD-L1 (22 C3): CPS: 100, CD274 (PD-L1) amplified, JAK2 amplified, PIK3CA pathogenic variant exon 10, T p53 pathogenic variant, MS-stable, TMB-low - Per Dr. Jenne Hicks, he also has local recurrence of disease in the floor of the mouth. - Keytruda from 03/10/2022 through 08/24/2022 with progression - XRT to the floor of the mouth on 06/29/2022 - PET scan 09/10/2022: Liver lesion increased in size measures 3.1 x 2.9 cm, previously 2.6 x 1.7 cm.  Right cervical lymph node meta stasis, mildly progressive. - HER2 by IHC 0 on biopsy from 01/28/2022. - Weekly cetuximab started on 10/07/2022   2.  Social/family history: - He lives in his apartment by himself and is independent of ADLs and IADLs.  He does not drive.  He worked as a Curator and several other jobs.  Quit smoking 1 month ago.  He smoked 1 pack/week for more than 50 years. - 1 brother had agent orange related cancer.  Another brother also had cancer, type unknown to the patient.   3.  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 - He was on close surveillance rather than adjuvant chemotherapy.    Plan: 1.  Stage IV (PT4PN1 M1) SCC of the floor of the mouth, p16 negative: - PET scan on 09/10/2022: Progression. - He is tolerating cetuximab weekly reasonably well.  Shoulder pain on the left side has completely improved.  He has rash on the face and upper chest. - Reviewed labs:  Normal LFTs and creatinine.  CBC with mild leukopenia of 3.8.  Hemoglobin is 10.  Platelet count normal.  Magnesium is normal. - Proceed with week 4 of Erbitux and continue Erbitux weekly.  RTC 11/19/2022.  Will repeat PET scan to evaluate response 3 months from the last.   2.  Oral pain: - Continue  liquid hydrocodone as needed.  Will send refill today.   3.  Cetuximab skin rash: - Continue doxycycline 100 mg twice daily. - Continue to use clindamycin gel twice daily.  May also use topical steroid hydrocortisone.   4.  Hypotension: - Continue to hold antihypertensives.  Blood pressure today is 112/56.    Orders Placed This Encounter  Procedures   CBC with Differential    Standing Status:   Future    Standing Expiration Date:   11/19/2023   Comprehensive metabolic panel    Standing Status:   Future    Standing Expiration Date:   11/19/2023   Magnesium    Standing Status:   Future    Standing Expiration Date:   11/19/2023   CBC with Differential    Standing Status:   Future    Standing Expiration Date:   11/26/2023   Comprehensive metabolic panel    Standing Status:   Future    Standing Expiration Date:   11/26/2023   Magnesium    Standing Status:   Future    Standing Expiration Date:   11/26/2023   CBC with Differential    Standing Status:   Future    Standing Expiration Date:   12/03/2023   Comprehensive metabolic panel    Standing Status:   Future    Standing Expiration Date:   12/03/2023   Magnesium    Standing Status:   Future    Standing Expiration Date:   12/03/2023   CBC with Differential    Standing Status:   Future    Standing Expiration Date:   12/10/2023   Comprehensive metabolic panel    Standing Status:   Future    Standing Expiration Date:   12/10/2023   Magnesium    Standing Status:   Future    Standing Expiration Date:   12/10/2023   CBC with Differential    Standing Status:   Future    Standing Expiration Date:   12/17/2023   Comprehensive metabolic panel    Standing Status:   Future    Standing Expiration Date:   12/17/2023   Magnesium    Standing Status:   Future    Standing Expiration Date:   12/17/2023   CBC with Differential    Standing Status:   Future    Standing Expiration Date:   12/24/2023   Comprehensive metabolic panel     Standing Status:   Future    Standing Expiration Date:   12/24/2023   Magnesium    Standing Status:   Future    Standing Expiration Date:   12/24/2023      Nathaniel Hicks,acting as a scribe for Nathaniel Massed, MD.,have documented all relevant documentation on the behalf of Nathaniel Massed, MD,as directed by  Nathaniel Massed, MD while in the presence of Nathaniel Massed, MD.  I, Nathaniel Massed MD, have reviewed the above documentation for accuracy and completeness, and I agree with the above.     Nathaniel Massed, MD   10/17/20245:46 PM  CHIEF COMPLAINT:   Diagnosis: squamous cell carcinoma of the floor of the mouth and testicular seminoma    Cancer  Staging  Cancer of floor of mouth [facility]) Staging form: Oral Cavity, AJCC 8th Edition - Clinical stage from 11/25/2021: Stage IVC (cT4a, cN1, pM1) - Signed by Nathaniel Massed, MD on 02/04/2022  Testicle cancer, right Staging form: Testis, AJCC 7th Edition - Clinical: Stage I (T2, N0, M0) - Signed by [name], PA-C on 07/23/2013    Prior Therapy: 1. Pembrolizumab, 03/10/22 - 08/24/22 2. 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 Testis, biopsy, right - SEMINOMA. PLEASE SEE COMMENT. 2. Testis, tumor, right - SEMINOMA, 7.5 CM. - ANGIOLYMPHATIC INVASION PRESENT. - RESECTION MARGINS, NEGATIVE FOR ATYPIA OR MALIGNANCY.   01/18/2013 PET scan   Status post right orchiectomy. No findings specific for metastatic disease. Small para-aortic nodes measuring up to 5 mm short axis, without convincing hypermetabolism. Given location, attention on follow-up is suggested.   04/17/2013 Imaging   CT CAP- No evidence of metastatic disease in the chest, abdomen or pelvis. Proximal LAD coronary artery calcification.   07/20/2013 Imaging   CT abd/pelvis- No  evidence of metastatic disease in the abdomen or pelvis.   10/24/2013 Imaging   CT abd/pelvis- No findings to suggest metastatic disease in the abdomen or pelvis   10/24/2013 Imaging   Chest xray- Probable COPD.  Negative for metastatic disease.   02/21/2014 Imaging   CT abd/pelvis- Stable abdominal pelvic CT status post right orchectomy. No evidence of adenopathy or other metastatic disease.   02/21/2014 Imaging   Chest xray- Left lower lobe mild atelectasis and/or infiltrate.     09/10/2014 Imaging   CT abd/pelvis- Status post right orchiectomy.   No evidence of metastatic disease.   3 mm nonobstructing right upper pole renal calculus. No hydronephrosis.   03/14/2015 Imaging   CT abd/pelvis- Stable exam. No evidence of metastatic disease or other acute findings within the abdomen or pelvis.   09/13/2015 Imaging   CT abd/pelvis- No acute findings and no evidence for mass or adenopathy.    04/24/2016 Imaging   CT abd/pelvis: IMPRESSION: 1. Stable exam. No new or progressive findings. No features to suggest metastatic disease.   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HCC)  10/14/2021 Initial Diagnosis   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HCC)   11/25/2021 Cancer Staging   Staging form: Oral Cavity, AJCC 8th Edition - Clinical stage from 11/25/2021: Stage IVC (cT4a, cN1, pM1) - Signed by Nathaniel Massed, MD on 02/04/2022 Histopathologic type: Squamous cell carcinoma, NOS Stage prefix: Initial diagnosis Histologic grade (G): G2 Histologic grading system: 3 grade system   03/10/2022 - 08/24/2022 Chemotherapy   Patient is on Treatment Plan : HEAD/NECK Pembrolizumab (200) q21d     10/07/2022 -  Chemotherapy   Patient is on Treatment Plan : HEAD/NECK Cetuximab q7d        INTERVAL HISTORY:   Sheamus is a 82 y.o. male presenting to clinic today for follow up of squamous cell carcinoma of the floor of the mouth and testicular seminoma. He was last seen by me on 10/15/22.  Today, he states that he is doing  well overall. His appetite level is at 75%. His energy level is at 50%. He is accompanied by a family member.   He presents with an erythematous maculopapular rash on the upper chest and face due to Cetuximab. He did not get a refill of his doxycyline and ran out  of the medication a few days ago, causing the rash. He is not able to get a refill of clindamycin for another 3 days as his insurance will not cover it. He denies any diarrhea. Left shoulder pain from last visit has resolved. He is still taking hydrocodone for mouth pain and would like a refill.   PAST MEDICAL HISTORY:   Past Medical History: Past Medical History:  Diagnosis Date   Arthritis    BPH (benign prostatic hyperplasia)    Difficult intubation    HOH (hard of hearing)    Hyperlipidemia 11/02/2017   Hypertension    Hypertension 11/02/2017   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HCC)    2014   Vitamin D deficiency 11/02/2017    Surgical History: Past Surgical History:  Procedure Laterality Date   COLONOSCOPY N/A 11/24/2012   Procedure: COLONOSCOPY;  Surgeon: Malissa Hippo, MD;  [facility];  Service: Endoscopy;  Laterality: N/A;  830-moved to 730 Ann notified pt   FLOOR OF MOUTH BIOPSY N/A 10/14/2021   Procedure: FLOOR OF MOUTH RESECTION;  Surgeon: Christia Reading, MD;  Location: [facility] OR;  Service: ENT;  Laterality: N/A;   HEMORROIDECTOMY     KNEE ARTHROSCOPY WITH LATERAL MENISECTOMY Right 08/31/2017   Procedure: KNEE ARTHROSCOPY WITH LATERAL MENISECTOMY;  Surgeon: Vickki Hearing, MD;  Location: [facility] ORS;  Service: Orthopedics;  Laterality: Right;   LESION EXCISION N/A 03/23/2012   Procedure: EXCISION NEOPLASM SCALP ;  Surgeon: Dalia Heading, MD;  Location: [facility];  Service: General;  Laterality: N/A;  Excision of Scalp Neoplasm   ORCHIECTOMY Right 12/20/2012   Procedure: RIGHT RADICAL ORCHIECTOMY/POSSIBLE BX RIGHT TESTICLE;  Surgeon: Ky Barban, MD;  Location: [facility];  Service: Urology;  Laterality: Right;    PORTACATH PLACEMENT Left 03/25/2022   Procedure: INSERTION PORT-A-CATH;  Surgeon: Franky Macho, MD;  Location: [facility];  Service: General;  Laterality: Left;   PROSTATE SURGERY     RADICAL NECK DISSECTION Bilateral 10/14/2021   Procedure: NECK DISSECTION;  Surgeon: Christia Reading, MD;  Location: [facility] OR;  Service: ENT;  Laterality: Bilateral;   SCALP LACERATION REPAIR     [facility]-Dr Katrinka Blazing   SKIN FULL THICKNESS GRAFT Bilateral 10/14/2021   Procedure: PLATYSMA FLAP CLOSURE;  Surgeon: Christia Reading, MD;  Location: [facility] OR;  Service: ENT;  Laterality: Bilateral;   TOOTH EXTRACTION  10/14/2021   Procedure: DENTAL EXTRACTIONS;  Surgeon: Christia Reading, MD;  Location: [facility] OR;  Service: ENT;;   TRACHEOSTOMY TUBE PLACEMENT N/A 10/14/2021   Procedure: TRACHEOSTOMY;  Surgeon: Christia Reading, MD;  Location: [facility] OR;  Service: ENT;  Laterality: N/A;    Social History: Social History   Socioeconomic History   Marital status: Single    Spouse name: Not on file   Number of children: Not on file   Years of education: Not on file   Highest education level: Not on file  Occupational History   Not on file  Tobacco Use   Smoking status: Former    Current packs/day: 0.25    Average packs/day: 0.3 packs/day for 50.0 years (12.5 ttl pk-yrs)    Types: Cigarettes   Smokeless tobacco: Never  Vaping Use   Vaping status: Never Used  Substance and Sexual Activity   Alcohol use: Not Currently   Drug use: No   Sexual activity: Yes    Birth control/protection: None  Other Topics Concern   Not on file  Social History Narrative   Not on file   Social Determinants of  Health   Financial Resource Strain: Not on file  Food Insecurity: Not on file  Transportation Needs: Not on file  Physical Activity: Not on file  Stress: Not on file  Social Connections: Not on file  Intimate Partner Violence: Not on file    Family History: Family History  Problem Relation Age of Onset   Cancer Brother    Cancer Brother      Current Medications:  Current Outpatient Medications:    acetaminophen (TYLENOL) 650 MG CR tablet, Take 650 mg by mouth every 8 (eight) hours as needed for pain., Disp: , Rfl:    amLODipine-olmesartan (AZOR) 5-20 MG tablet, TAKE 1 TABLET BY MOUTH DAILY, Disp: 90 tablet, Rfl: 0   budesonide-formoterol (SYMBICORT) 80-4.5 MCG/ACT inhaler, Inhale 2 puffs into the lungs in the morning and at bedtime., Disp: 1 each, Rfl: 12   Cholecalciferol (VITAMIN D3) 50 MCG (2000 UT) TABS, Take 2,000 Units by mouth daily., Disp: , Rfl:    clindamycin (CLINDAGEL) 1 % gel, Apply topically 2 (two) times daily. Apply twice daily to chest rash, Disp: 30 g, Rfl: 0   doxycycline (VIBRA-TABS) 100 MG tablet, Take 1 tablet (100 mg total) by mouth 2 (two) times daily., Disp: 60 tablet, Rfl: 3   lidocaine (XYLOCAINE) 2 % solution, Take by mouth., Disp: , Rfl:    magnesium oxide (MAG-OX) 400 MG tablet, Take 400 mg by mouth daily., Disp: , Rfl:    Menthol, Topical Analgesic, (BIOFREEZE EX), Apply 1 application  topically daily as needed (pain)., Disp: , Rfl:    naloxone (NARCAN) nasal spray 4 mg/0.1 mL, SMARTSIG:Both Nares, Disp: , Rfl:    Polyethyl Glycol-Propyl Glycol (GOODSENSE LUBRICANT EYE DROPS OP), Place 1 drop into both eyes daily as needed (dry eyes)., Disp: , Rfl:    pravastatin (PRAVACHOL) 80 MG tablet, Take 80 mg by mouth daily., Disp: , Rfl:    predniSONE (DELTASONE) 10 MG tablet, Take  4 each am x 2 days,   2 each am x 2 days,  1 each am x 2 days and stop, Disp: 14 tablet, Rfl: 0   RAPAFLO 8 MG CAPS capsule, Take 8 mg by mouth daily. Silodosin, Disp: , Rfl:    vitamin B-12 (CYANOCOBALAMIN) 500 MCG tablet, Take 500 mcg by mouth daily., Disp: , Rfl:    HYDROcodone-acetaminophen (HYCET) 7.5-325 mg/15 ml solution, Take 15 mLs by mouth every 6 (six) hours as needed for moderate pain (pain score 4-6)., Disp: 473 mL, Rfl: 0 No current facility-administered medications for this visit.  Facility-Administered  Medications Ordered in Other Visits:    sodium chloride flush (NS) 0.9 % injection 10 mL, 10 mL, Intracatheter, PRN, Nathaniel Massed, MD, 10 mL at 10/29/22 1335   Allergies: No Known Allergies  REVIEW OF SYSTEMS:   Review of Systems  Constitutional:  Negative for chills, fatigue and fever.  HENT:   Positive for trouble swallowing. Negative for lump/mass, mouth sores, nosebleeds and sore throat.   Eyes:  Negative for eye problems.  Respiratory:  Negative for cough and shortness of breath.   Cardiovascular:  Negative for chest pain, leg swelling and palpitations.  Gastrointestinal:  Negative for abdominal pain, constipation, diarrhea, nausea and vomiting.  Genitourinary:  Negative for bladder incontinence, difficulty urinating, dysuria, frequency, hematuria and nocturia.   Musculoskeletal:  Negative for arthralgias, back pain, flank pain, myalgias and neck pain.  Skin:  Positive for rash. Negative for itching.  Neurological:  Negative for dizziness, headaches and numbness.  Hematological:  Does not  bruise/bleed easily.  Psychiatric/Behavioral:  Negative for depression, sleep disturbance and suicidal ideas. The patient is not nervous/anxious.   All other systems reviewed and are negative.    VITALS:   There were no vitals taken for this visit.  Wt Readings from Last 3 Encounters:  10/29/22 149 lb 3.2 oz (67.7 kg)  10/21/22 149 lb 6.4 oz (67.8 kg)  10/15/22 145 lb 9.6 oz (66 kg)    There is no height or weight on file to calculate BMI.  Performance status (ECOG): 1 - Symptomatic but completely ambulatory  PHYSICAL EXAM:   Physical Exam Vitals and nursing note reviewed. Exam conducted with a chaperone present.  Constitutional:      Appearance: Normal appearance.  Cardiovascular:     Rate and Rhythm: Normal rate and regular rhythm.     Pulses: Normal pulses.     Heart sounds: Normal heart sounds.  Pulmonary:     Effort: Pulmonary effort is normal.     Breath sounds:  Normal breath sounds.  Abdominal:     Palpations: Abdomen is soft. There is no hepatomegaly, splenomegaly or mass.     Tenderness: There is no abdominal tenderness.  Musculoskeletal:     Right lower leg: No edema.     Left lower leg: No edema.  Lymphadenopathy:     Cervical: No cervical adenopathy.     Right cervical: No superficial, deep or posterior cervical adenopathy.    Left cervical: No superficial, deep or posterior cervical adenopathy.     Upper Body:     Right upper body: No supraclavicular or axillary adenopathy.     Left upper body: No supraclavicular or axillary adenopathy.  Neurological:     General: No focal deficit present.     Mental Status: He is alert and oriented to person, place, and time.  Psychiatric:        Mood and Affect: Mood normal.        Behavior: Behavior normal.     LABS:      Latest Ref Rng & Units 10/29/2022    8:45 AM 10/21/2022    7:43 AM 10/15/2022    7:47 AM  CBC  WBC 4.0 - 10.5 K/uL 3.8  5.6  6.1   Hemoglobin 13.0 - 17.0 g/dL 13.0  9.8  86.5   Hematocrit 39.0 - 52.0 % 31.7  31.0  31.8   Platelets 150 - 400 K/uL 241  264  300       Latest Ref Rng & Units 10/29/2022    8:45 AM 10/21/2022    7:43 AM 10/15/2022    7:47 AM  CMP  Glucose 70 - 99 mg/dL 784  696  295   BUN 8 - 23 mg/dL 14  12  25    Creatinine 0.61 - 1.24 mg/dL 2.84  1.32  4.40   Sodium 135 - 145 mmol/L 137  135  134   Potassium 3.5 - 5.1 mmol/L 4.0  4.0  4.7   Chloride 98 - 111 mmol/L 101  102  100   CO2 22 - 32 mmol/L 27  22  27    Calcium 8.9 - 10.3 mg/dL 9.6  9.5  10.2   Total Protein 6.5 - 8.1 g/dL 6.7  6.8  7.5   Total Bilirubin 0.3 - 1.2 mg/dL 0.8  0.5  0.4   Alkaline Phos 38 - 126 U/L 46  51  51   AST 15 - 41 U/L 23  14  15    ALT  0 - 44 U/L 19  14  14       Lab Results  Component Value Date   CEA 2.2 01/19/2013   /  CEA  Date Value Ref Range Status  01/19/2013 2.2 0.0 - 5.0 ng/mL Final    Comment:    Performed at [facility]   No results found  for: "PSA1" No results found for: "CAN199" No results found for: "CAN125"  No results found for: "TOTALPROTELP", "ALBUMINELP", "A1GS", "A2GS", "BETS", "BETA2SER", "GAMS", "MSPIKE", "SPEI" Lab Results  Component Value Date   TIBC 244 (L) 08/04/2022   TIBC 251 05/12/2022   FERRITIN 273 08/04/2022   FERRITIN 195 05/12/2022   IRONPCTSAT 19 08/04/2022   IRONPCTSAT 18 05/12/2022   Lab Results  Component Value Date   LDH 172 11/12/2020   LDH 175 10/26/2018   LDH 179 10/25/2013     STUDIES:   No results found.

## 2022-10-29 ENCOUNTER — Other Ambulatory Visit: Payer: Self-pay

## 2022-10-29 ENCOUNTER — Inpatient Hospital Stay: Payer: 59

## 2022-10-29 ENCOUNTER — Inpatient Hospital Stay: Payer: 59 | Admitting: Hematology

## 2022-10-29 DIAGNOSIS — C770 Secondary and unspecified malignant neoplasm of lymph nodes of head, face and neck: Secondary | ICD-10-CM | POA: Diagnosis not present

## 2022-10-29 DIAGNOSIS — C049 Malignant neoplasm of floor of mouth, unspecified: Secondary | ICD-10-CM | POA: Diagnosis not present

## 2022-10-29 DIAGNOSIS — Z5111 Encounter for antineoplastic chemotherapy: Secondary | ICD-10-CM | POA: Diagnosis not present

## 2022-10-29 DIAGNOSIS — Z87891 Personal history of nicotine dependence: Secondary | ICD-10-CM | POA: Diagnosis not present

## 2022-10-29 DIAGNOSIS — C787 Secondary malignant neoplasm of liver and intrahepatic bile duct: Secondary | ICD-10-CM | POA: Diagnosis not present

## 2022-10-29 DIAGNOSIS — Z79899 Other long term (current) drug therapy: Secondary | ICD-10-CM | POA: Diagnosis not present

## 2022-10-29 DIAGNOSIS — I959 Hypotension, unspecified: Secondary | ICD-10-CM | POA: Diagnosis not present

## 2022-10-29 DIAGNOSIS — D630 Anemia in neoplastic disease: Secondary | ICD-10-CM | POA: Diagnosis not present

## 2022-10-29 LAB — CBC WITH DIFFERENTIAL/PLATELET
Abs Immature Granulocytes: 0.01 10*3/uL (ref 0.00–0.07)
Basophils Absolute: 0 10*3/uL (ref 0.0–0.1)
Basophils Relative: 1 %
Eosinophils Absolute: 0.2 10*3/uL (ref 0.0–0.5)
Eosinophils Relative: 6 %
HCT: 31.7 % — ABNORMAL LOW (ref 39.0–52.0)
Hemoglobin: 10 g/dL — ABNORMAL LOW (ref 13.0–17.0)
Immature Granulocytes: 0 %
Lymphocytes Relative: 27 %
Lymphs Abs: 1 10*3/uL (ref 0.7–4.0)
MCH: 32.3 pg (ref 26.0–34.0)
MCHC: 31.5 g/dL (ref 30.0–36.0)
MCV: 102.3 fL — ABNORMAL HIGH (ref 80.0–100.0)
Monocytes Absolute: 0.5 10*3/uL (ref 0.1–1.0)
Monocytes Relative: 12 %
Neutro Abs: 2.1 10*3/uL (ref 1.7–7.7)
Neutrophils Relative %: 54 %
Platelets: 241 10*3/uL (ref 150–400)
RBC: 3.1 MIL/uL — ABNORMAL LOW (ref 4.22–5.81)
RDW: 14.4 % (ref 11.5–15.5)
WBC: 3.8 10*3/uL — ABNORMAL LOW (ref 4.0–10.5)
nRBC: 0 % (ref 0.0–0.2)

## 2022-10-29 LAB — COMPREHENSIVE METABOLIC PANEL
ALT: 19 U/L (ref 0–44)
AST: 23 U/L (ref 15–41)
Albumin: 3.2 g/dL — ABNORMAL LOW (ref 3.5–5.0)
Alkaline Phosphatase: 46 U/L (ref 38–126)
Anion gap: 9 (ref 5–15)
BUN: 14 mg/dL (ref 8–23)
CO2: 27 mmol/L (ref 22–32)
Calcium: 9.6 mg/dL (ref 8.9–10.3)
Chloride: 101 mmol/L (ref 98–111)
Creatinine, Ser: 0.84 mg/dL (ref 0.61–1.24)
GFR, Estimated: >60 mL/min (ref >60)
Glucose, Bld: 105 mg/dL — ABNORMAL HIGH (ref 70–99)
Potassium: 4 mmol/L (ref 3.5–5.1)
Sodium: 137 mmol/L (ref 135–145)
Total Bilirubin: 0.8 mg/dL (ref 0.3–1.2)
Total Protein: 6.7 g/dL (ref 6.5–8.1)

## 2022-10-29 LAB — MAGNESIUM: Magnesium: 1.7 mg/dL (ref 1.7–2.4)

## 2022-10-29 MED ORDER — HEPARIN SOD (PORK) LOCK FLUSH 100 UNIT/ML IV SOLN
500.0000 [IU] | Freq: Once | INTRAVENOUS | Status: AC | PRN
  Administered 2022-10-29: 500 [IU]

## 2022-10-29 MED ORDER — SODIUM CHLORIDE 0.9 % IV SOLN: Freq: Once | INTRAVENOUS | Status: AC

## 2022-10-29 MED ORDER — DIPHENHYDRAMINE HCL 50 MG/ML IJ SOLN
50.0000 mg | Freq: Once | INTRAMUSCULAR | Status: AC
  Administered 2022-10-29: 50 mg via INTRAVENOUS
  Filled 2022-10-29: qty 1

## 2022-10-29 MED ORDER — FAMOTIDINE IN NACL 20-0.9 MG/50ML-% IV SOLN
20.0000 mg | Freq: Once | INTRAVENOUS | Status: AC
  Administered 2022-10-29: 20 mg via INTRAVENOUS
  Filled 2022-10-29: qty 50

## 2022-10-29 MED ORDER — HYDROCODONE-ACETAMINOPHEN 7.5-325 MG/15ML PO SOLN: 15.0000 mL | Freq: Four times a day (QID) | ORAL | 0 refills | Status: DC | PRN

## 2022-10-29 MED ORDER — METHYLPREDNISOLONE SODIUM SUCC 125 MG IJ SOLR
125.0000 mg | Freq: Once | INTRAMUSCULAR | Status: AC
  Administered 2022-10-29: 125 mg via INTRAVENOUS
  Filled 2022-10-29: qty 2

## 2022-10-29 MED ORDER — CETUXIMAB CHEMO IV INJECTION 200 MG/100ML
250.0000 mg/m2 | Freq: Once | INTRAVENOUS | Status: AC
  Administered 2022-10-29: 500 mg via INTRAVENOUS
  Filled 2022-10-29: qty 50

## 2022-10-29 MED ORDER — SODIUM CHLORIDE 0.9% FLUSH
10.0000 mL | INTRAVENOUS | Status: DC | PRN
  Administered 2022-10-29: 10 mL

## 2022-10-29 MED ORDER — SODIUM CHLORIDE 0.9% FLUSH
10.0000 mL | Freq: Once | INTRAVENOUS | Status: AC
  Administered 2022-10-29: 10 mL via INTRAVENOUS

## 2022-10-29 NOTE — Progress Notes (Signed)
Patient has been assessed, vital signs and labs have been reviewed by Dr. Katragadda. ANC, Creatinine, LFTs, and Platelets are within treatment parameters per Dr. Katragadda. The patient is good to proceed with treatment at this time. Primary RN and pharmacy aware.  

## 2022-10-29 NOTE — Patient Instructions (Addendum)
[facility]  Discharge Instructions  You were seen and examined today by Dr. Ellin Saba.  Dr. Ellin Saba discussed your most recent lab work which is stable. Continue clindamycin and doxycycline due to the skin rash. If you have any lapse in getting Clindamycin, please use over-the-counter hydrocortisone cream to the rash area twice daily.  Do not take any blood pressure medicines.  Proceed with treatment today as planned.  Follow-up as scheduled.  Thank you for choosing [name] to provide your oncology and hematology care.   To afford each patient quality time with our provider, please arrive at least 15 minutes before your scheduled appointment time. You may need to reschedule your appointment if you arrive late (10 or more minutes). Arriving late affects you and other patients whose appointments are after yours.  Also, if you miss three or more appointments without notifying the office, you may be dismissed from the clinic at the provider's discretion.    Again, thank you for choosing [facility].  Our hope is that these requests will decrease the amount of time that you wait before being seen by our physicians.   If you have a lab appointment with the Cancer Center - please note that after April 8th, all labs will be drawn in the cancer center.  You do not have to check in or register with the main entrance as you have in the past but will complete your check-in at the cancer center.            _____________________________________________________________  Should you have questions after your visit to [facility], please contact our office at [phone] and follow the prompts.  Our office hours are 8:00 a.m. to 4:30 p.m. Monday - Thursday and 8:00 a.m. to 2:30 p.m. Friday.  Please note that voicemails left after 4:00 p.m. may not be returned until the following business day.  We are closed weekends and  all major holidays.  You do have access to a nurse 24-7, just call the main number to the clinic [phone] and do not press any options, hold on the line and a nurse will answer the phone.    For prescription refill requests, have your pharmacy contact our office and allow 72 hours.    Masks are no longer required in the cancer centers. If you would like for your care team to wear a mask while they are taking care of you, please let them know. You may have one support person who is at least 82 years old accompany you for your appointments.

## 2022-10-29 NOTE — Progress Notes (Signed)
Patient presents today for chemotherapy infusion. Patient is in satisfactory condition with no new complaints voiced.  Vital signs are stable.  Labs reviewed by Dr. Ellin Saba during the office visit and all labs are within treatment parameters.  We will proceed with treatment per MD orders.   Patient tolerated treatment well with no complaints voiced.  Patient left ambulatory with sister in stable condition.  Vital signs stable at discharge.  Follow up as scheduled.

## 2022-10-29 NOTE — Patient Instructions (Signed)
[facility]  Discharge Instructions: Thank you for choosing [facility] to provide your oncology and hematology care.  If you have a lab appointment with the Cancer Center - please note that after April 8th, 2024, all labs will be drawn in the cancer center.  You do not have to check in or register with the main entrance as you have in the past but will complete your check-in in the cancer center.  Wear comfortable clothing and clothing appropriate for easy access to any Portacath or PICC line.   We strive to give you quality time with your provider. You may need to reschedule your appointment if you arrive late (15 or more minutes).  Arriving late affects you and other patients whose appointments are after yours.  Also, if you miss three or more appointments without notifying the office, you may be dismissed from the clinic at the provider's discretion.      For prescription refill requests, have your pharmacy contact our office and allow 72 hours for refills to be completed.    Today you received the following chemotherapy and/or immunotherapy agents Erbitux.  Cetuximab Injection What is this medication? CETUXIMAB (se TUX i mab) treats head and neck cancer. It may also be used to treat colorectal cancer. It works by blocking a protein that causes cancer cells to grow and multiply. This helps to slow or stop the spread of cancer cells. It is a monoclonal antibody. This medicine may be used for other purposes; ask your health care provider or pharmacist if you have questions. COMMON BRAND NAME(S): Erbitux What should I tell my care team before I take this medication? They need to know if you have any of these conditions: Heart disease History of tick bites Low levels of calcium, magnesium, or potassium in the blood Lung disease Red meat allergy An unusual or allergic reaction to cetuximab, other medications, foods, dyes, or preservatives Pregnant or trying  to get pregnant Breast-feeding How should I use this medication? This medication is infused into a vein. It is given by your care team in a hospital or clinic setting. Talk to your care team about the use of this medication in children. Special care may be needed. Overdosage: If you think you have taken too much of this medicine contact a poison control center or emergency room at once. NOTE: This medicine is only for you. Do not share this medicine with others. What if I miss a dose? Keep appointments for follow-up doses. It is important not to miss your dose. Call your care team if you are unable to keep an appointment. What may interact with this medication? Interactions are not expected. This list may not describe all possible interactions. Give your health care provider a list of all the medicines, herbs, non-prescription drugs, or dietary supplements you use. Also tell them if you smoke, drink alcohol, or use illegal drugs. Some items may interact with your medicine. What should I watch for while using this medication? Visit your care team for regular checks on your progress. This medication may make you feel generally unwell. This is not uncommon, as chemotherapy can affect healthy cells as well as cancer cells. Report any side effects. Continue your course of treatment even though you feel ill unless your care team tells you to stop. You may need blood work done while you are taking this medication. This medication can make you more sensitive to the sun. Keep out of the sun while  taking this medication and for 2 months after the last dose. If you cannot avoid being in the sun, wear protective clothing and sunscreen. Do not use sun lamps, tanning beds, or tanning booths. This medication can cause serious infusion reactions. To reduce the risk, your care team may give you other medications to take before receiving this one. Be sure to follow the directions of your care team. This medication may  cause serious skin reactions. They can happen weeks to months after starting the medication. Contact your care team right away if you notice fevers or flu-like symptoms with a rash. The rash may be red or purple and then turn into blisters or peeling of the skin. You may also notice a red rash with swelling of the face, lips, or lymph nodes in your neck or under your arms. Talk to your care team if you may be pregnant. Serious birth defects can occur if you take this medication during pregnancy and for 2 months after the last dose. You will need a negative pregnancy test before starting this medication. Contraception is recommended while taking this medication and for 2 months after the last dose. Your care team can help you find the option that works for you. Do not breastfeed while taking this medication and for 2 months after the last dose. This medication may cause infertility. Talk to your care team if you are concerned about your fertility. What side effects may I notice from receiving this medication? Side effects that you should report to your care team as soon as possible: Allergic reactions--skin rash, itching, hives, swelling of the face, lips, tongue, or throat Dry cough, shortness of breath or trouble breathing Heart attack--pain or tightness in the chest, shoulders, arms, or jaw, nausea, shortness of breath, cold or clammy skin, feeling faint or lightheaded Infusion reactions--chest pain, shortness of breath or trouble breathing, feeling faint or lightheaded Low calcium level--muscle pain or cramps, confusion, tingling, or numbness in the hands or feet Low magnesium level--muscle pain or cramps, unusual weakness or fatigue, fast or irregular heartbeat, tremors Low potassium level--muscle pain or cramps, unusual weakness or fatigue, fast or irregular heartbeat, constipation Redness, blistering, peeling, or loosening of the skin, including inside the mouth Side effects that usually do not  require medical attention (report to your care team if they continue or are bothersome): Diarrhea Headache Joint pain Nausea Unusual weakness or fatigue Weight loss This list may not describe all possible side effects. Call your doctor for medical advice about side effects. You may report side effects to FDA at 1-800-FDA-1088. Where should I keep my medication? This medication is given in a hospital or clinic. It will not be stored at home. NOTE: This sheet is a summary. It may not cover all possible information. If you have questions about this medicine, talk to your doctor, pharmacist, or health care provider.  2024 Elsevier/Gold Standard (2021-05-16 00:00:00)        To help prevent nausea and vomiting after your treatment, we encourage you to take your nausea medication as directed.  BELOW ARE SYMPTOMS THAT SHOULD BE REPORTED IMMEDIATELY: *FEVER GREATER THAN 100.4 F (38 C) OR HIGHER *CHILLS OR SWEATING *NAUSEA AND VOMITING THAT IS NOT CONTROLLED WITH YOUR NAUSEA MEDICATION *UNUSUAL SHORTNESS OF BREATH *UNUSUAL BRUISING OR BLEEDING *URINARY PROBLEMS (pain or burning when urinating, or frequent urination) *BOWEL PROBLEMS (unusual diarrhea, constipation, pain near the anus) TENDERNESS IN MOUTH AND THROAT WITH OR WITHOUT PRESENCE OF ULCERS (sore throat, sores in mouth, or  a toothache) UNUSUAL RASH, SWELLING OR PAIN  UNUSUAL VAGINAL DISCHARGE OR ITCHING   Items with * indicate a potential emergency and should be followed up as soon as possible or go to the Emergency Department if any problems should occur.  Please show the CHEMOTHERAPY ALERT CARD or IMMUNOTHERAPY ALERT CARD at check-in to the Emergency Department and triage nurse.  Should you have questions after your visit or need to cancel or reschedule your appointment, please contact [facility] [phone]  and follow the prompts.  Office hours are 8:00 a.m. to 4:30 p.m. Monday - Friday. Please note that  voicemails left after 4:00 p.m. may not be returned until the following business day.  We are closed weekends and major holidays. You have access to a nurse at all times for urgent questions. Please call the main number to the clinic [phone] and follow the prompts.  For any non-urgent questions, you may also contact your provider using MyChart. We now offer e-Visits for anyone 54 and older to request care online for non-urgent symptoms. For details visit [email or web address].   Also download the MyChart app! Go to the app store, search "MyChart", open the app, select [place], and log in with your MyChart username and password.

## 2022-10-30 ENCOUNTER — Other Ambulatory Visit: Payer: Self-pay

## 2022-10-30 DIAGNOSIS — R21 Rash and other nonspecific skin eruption: Secondary | ICD-10-CM

## 2022-10-30 NOTE — Telephone Encounter (Signed)
Patient's sister Cline Cools called requesting refill on patients antibiotic medication.   Called patient's sister Cline Cools  to get more information about which antibiotic he needs refill. She states that he does not need refill at this time.

## 2022-11-01 ENCOUNTER — Other Ambulatory Visit: Payer: Self-pay

## 2022-11-05 ENCOUNTER — Inpatient Hospital Stay: Payer: 59

## 2022-11-05 ENCOUNTER — Inpatient Hospital Stay: Payer: 59 | Admitting: Hematology

## 2022-11-05 VITALS — BP 145/69 | HR 71 | Temp 96.9°F | Resp 18

## 2022-11-05 DIAGNOSIS — C049 Malignant neoplasm of floor of mouth, unspecified: Secondary | ICD-10-CM

## 2022-11-05 DIAGNOSIS — C787 Secondary malignant neoplasm of liver and intrahepatic bile duct: Secondary | ICD-10-CM | POA: Diagnosis not present

## 2022-11-05 DIAGNOSIS — Z87891 Personal history of nicotine dependence: Secondary | ICD-10-CM | POA: Diagnosis not present

## 2022-11-05 DIAGNOSIS — C770 Secondary and unspecified malignant neoplasm of lymph nodes of head, face and neck: Secondary | ICD-10-CM | POA: Diagnosis not present

## 2022-11-05 DIAGNOSIS — Z79899 Other long term (current) drug therapy: Secondary | ICD-10-CM | POA: Diagnosis not present

## 2022-11-05 DIAGNOSIS — Z5111 Encounter for antineoplastic chemotherapy: Secondary | ICD-10-CM | POA: Diagnosis not present

## 2022-11-05 DIAGNOSIS — I959 Hypotension, unspecified: Secondary | ICD-10-CM | POA: Diagnosis not present

## 2022-11-05 DIAGNOSIS — D630 Anemia in neoplastic disease: Secondary | ICD-10-CM | POA: Diagnosis not present

## 2022-11-05 LAB — COMPREHENSIVE METABOLIC PANEL
ALT: 19 U/L (ref 0–44)
AST: 17 U/L (ref 15–41)
Albumin: 3.3 g/dL — ABNORMAL LOW (ref 3.5–5.0)
Alkaline Phosphatase: 46 U/L (ref 38–126)
Anion gap: 9 (ref 5–15)
BUN: 15 mg/dL (ref 8–23)
CO2: 26 mmol/L (ref 22–32)
Calcium: 9.9 mg/dL (ref 8.9–10.3)
Chloride: 103 mmol/L (ref 98–111)
Creatinine, Ser: 0.82 mg/dL (ref 0.61–1.24)
GFR, Estimated: >60 mL/min (ref >60)
Glucose, Bld: 95 mg/dL (ref 70–99)
Potassium: 4.2 mmol/L (ref 3.5–5.1)
Sodium: 138 mmol/L (ref 135–145)
Total Bilirubin: 0.7 mg/dL (ref 0.3–1.2)
Total Protein: 6.9 g/dL (ref 6.5–8.1)

## 2022-11-05 LAB — CBC WITH DIFFERENTIAL/PLATELET
Abs Immature Granulocytes: 0.02 10*3/uL (ref 0.00–0.07)
Basophils Absolute: 0 10*3/uL (ref 0.0–0.1)
Basophils Relative: 1 %
Eosinophils Absolute: 0.3 10*3/uL (ref 0.0–0.5)
Eosinophils Relative: 5 %
HCT: 33.3 % — ABNORMAL LOW (ref 39.0–52.0)
Hemoglobin: 10.5 g/dL — ABNORMAL LOW (ref 13.0–17.0)
Immature Granulocytes: 0 %
Lymphocytes Relative: 18 %
Lymphs Abs: 1.1 10*3/uL (ref 0.7–4.0)
MCH: 32.7 pg (ref 26.0–34.0)
MCHC: 31.5 g/dL (ref 30.0–36.0)
MCV: 103.7 fL — ABNORMAL HIGH (ref 80.0–100.0)
Monocytes Absolute: 0.8 10*3/uL (ref 0.1–1.0)
Monocytes Relative: 13 %
Neutro Abs: 3.8 10*3/uL (ref 1.7–7.7)
Neutrophils Relative %: 63 %
Platelets: 305 10*3/uL (ref 150–400)
RBC: 3.21 MIL/uL — ABNORMAL LOW (ref 4.22–5.81)
RDW: 14.6 % (ref 11.5–15.5)
WBC: 6 10*3/uL (ref 4.0–10.5)
nRBC: 0 % (ref 0.0–0.2)

## 2022-11-05 LAB — MAGNESIUM: Magnesium: 1.6 mg/dL — ABNORMAL LOW (ref 1.7–2.4)

## 2022-11-05 MED ORDER — CETUXIMAB CHEMO IV INJECTION 200 MG/100ML
250.0000 mg/m2 | Freq: Once | INTRAVENOUS | Status: AC
  Administered 2022-11-05: 500 mg via INTRAVENOUS
  Filled 2022-11-05: qty 200

## 2022-11-05 MED ORDER — FAMOTIDINE IN NACL 20-0.9 MG/50ML-% IV SOLN
20.0000 mg | Freq: Once | INTRAVENOUS | Status: AC
  Administered 2022-11-05: 20 mg via INTRAVENOUS
  Filled 2022-11-05: qty 50

## 2022-11-05 MED ORDER — DIPHENHYDRAMINE HCL 50 MG/ML IJ SOLN
50.0000 mg | Freq: Once | INTRAMUSCULAR | Status: AC
  Administered 2022-11-05: 50 mg via INTRAVENOUS
  Filled 2022-11-05: qty 1

## 2022-11-05 MED ORDER — MAGNESIUM SULFATE 2 GM/50ML IV SOLN
2.0000 g | Freq: Once | INTRAVENOUS | Status: AC
  Administered 2022-11-05: 2 g via INTRAVENOUS
  Filled 2022-11-05: qty 50

## 2022-11-05 MED ORDER — SODIUM CHLORIDE 0.9% FLUSH
10.0000 mL | INTRAVENOUS | Status: DC | PRN
  Administered 2022-11-05: 10 mL

## 2022-11-05 MED ORDER — HEPARIN SOD (PORK) LOCK FLUSH 100 UNIT/ML IV SOLN
500.0000 [IU] | Freq: Once | INTRAVENOUS | Status: AC | PRN
  Administered 2022-11-05: 500 [IU]

## 2022-11-05 MED ORDER — SODIUM CHLORIDE 0.9 % IV SOLN: Freq: Once | INTRAVENOUS | Status: AC

## 2022-11-05 MED ORDER — METHYLPREDNISOLONE SODIUM SUCC 125 MG IJ SOLR
125.0000 mg | Freq: Once | INTRAMUSCULAR | Status: AC
Start: 2022-11-05 — End: 2022-11-05
  Administered 2022-11-05: 125 mg via INTRAVENOUS
  Filled 2022-11-05: qty 2

## 2022-11-05 NOTE — Patient Instructions (Signed)
[facility]  Discharge Instructions: Thank you for choosing [facility] to provide your oncology and hematology care.  If you have a lab appointment with the Cancer Center - please note that after April 8th, 2024, all labs will be drawn in the cancer center.  You do not have to check in or register with the main entrance as you have in the past but will complete your check-in in the cancer center.  Wear comfortable clothing and clothing appropriate for easy access to any Portacath or PICC line.   We strive to give you quality time with your provider. You may need to reschedule your appointment if you arrive late (15 or more minutes).  Arriving late affects you and other patients whose appointments are after yours.  Also, if you miss three or more appointments without notifying the office, you may be dismissed from the clinic at the provider's discretion.      For prescription refill requests, have your pharmacy contact our office and allow 72 hours for refills to be completed.    Today you received the following chemotherapy and/or immunotherapy agents Erbitux   To help prevent nausea and vomiting after your treatment, we encourage you to take your nausea medication as directed.  BELOW ARE SYMPTOMS THAT SHOULD BE REPORTED IMMEDIATELY: *FEVER GREATER THAN 100.4 F (38 C) OR HIGHER *CHILLS OR SWEATING *NAUSEA AND VOMITING THAT IS NOT CONTROLLED WITH YOUR NAUSEA MEDICATION *UNUSUAL SHORTNESS OF BREATH *UNUSUAL BRUISING OR BLEEDING *URINARY PROBLEMS (pain or burning when urinating, or frequent urination) *BOWEL PROBLEMS (unusual diarrhea, constipation, pain near the anus) TENDERNESS IN MOUTH AND THROAT WITH OR WITHOUT PRESENCE OF ULCERS (sore throat, sores in mouth, or a toothache) UNUSUAL RASH, SWELLING OR PAIN  UNUSUAL VAGINAL DISCHARGE OR ITCHING   Items with * indicate a potential emergency and should be followed up as soon as possible or go to the  Emergency Department if any problems should occur.  Please show the CHEMOTHERAPY ALERT CARD or IMMUNOTHERAPY ALERT CARD at check-in to the Emergency Department and triage nurse.  Should you have questions after your visit or need to cancel or reschedule your appointment, please contact [facility] [phone]  and follow the prompts.  Office hours are 8:00 a.m. to 4:30 p.m. Monday - Friday. Please note that voicemails left after 4:00 p.m. may not be returned until the following business day.  We are closed weekends and major holidays. You have access to a nurse at all times for urgent questions. Please call the main number to the clinic [phone] and follow the prompts.  For any non-urgent questions, you may also contact your provider using MyChart. We now offer e-Visits for anyone 66 and older to request care online for non-urgent symptoms. For details visit [email or web address].   Also download the MyChart app! Go to the app store, search "MyChart", open the app, select [place], and log in with your MyChart username and password.

## 2022-11-05 NOTE — Progress Notes (Signed)
Patient presents today for Erbitux infusion per providers order.  Vital signs and labs within parameters for treatment.  2 grams Magnesium sulfate to be given per standing order.  Patient has no new complaints at this time.  Treatment given today per MD orders.  Stable during infusion without adverse affects.  Vital signs stable.  No complaints at this time.  Discharge from clinic ambulatory in stable condition.  Alert and oriented X 3.  Follow up with [facility] as scheduled.

## 2022-11-10 ENCOUNTER — Other Ambulatory Visit: Payer: Self-pay

## 2022-11-12 ENCOUNTER — Inpatient Hospital Stay: Payer: 59

## 2022-11-12 ENCOUNTER — Inpatient Hospital Stay: Payer: 59 | Admitting: Hematology

## 2022-11-12 VITALS — BP 127/75 | HR 74 | Temp 96.7°F | Resp 18

## 2022-11-12 VITALS — BP 108/69 | HR 90 | Temp 98.1°F | Resp 18 | Wt 151.0 lb

## 2022-11-12 DIAGNOSIS — I959 Hypotension, unspecified: Secondary | ICD-10-CM | POA: Diagnosis not present

## 2022-11-12 DIAGNOSIS — C787 Secondary malignant neoplasm of liver and intrahepatic bile duct: Secondary | ICD-10-CM | POA: Diagnosis not present

## 2022-11-12 DIAGNOSIS — D630 Anemia in neoplastic disease: Secondary | ICD-10-CM | POA: Diagnosis not present

## 2022-11-12 DIAGNOSIS — Z79899 Other long term (current) drug therapy: Secondary | ICD-10-CM | POA: Diagnosis not present

## 2022-11-12 DIAGNOSIS — Z95828 Presence of other vascular implants and grafts: Secondary | ICD-10-CM

## 2022-11-12 DIAGNOSIS — C770 Secondary and unspecified malignant neoplasm of lymph nodes of head, face and neck: Secondary | ICD-10-CM | POA: Diagnosis not present

## 2022-11-12 DIAGNOSIS — Z87891 Personal history of nicotine dependence: Secondary | ICD-10-CM | POA: Diagnosis not present

## 2022-11-12 DIAGNOSIS — C049 Malignant neoplasm of floor of mouth, unspecified: Secondary | ICD-10-CM

## 2022-11-12 DIAGNOSIS — Z5111 Encounter for antineoplastic chemotherapy: Secondary | ICD-10-CM | POA: Diagnosis not present

## 2022-11-12 DIAGNOSIS — R790 Abnormal level of blood mineral: Secondary | ICD-10-CM

## 2022-11-12 LAB — COMPREHENSIVE METABOLIC PANEL
ALT: 17 U/L (ref 0–44)
AST: 17 U/L (ref 15–41)
Albumin: 3.3 g/dL — ABNORMAL LOW (ref 3.5–5.0)
Alkaline Phosphatase: 44 U/L (ref 38–126)
Anion gap: 9 (ref 5–15)
BUN: 15 mg/dL (ref 8–23)
CO2: 24 mmol/L (ref 22–32)
Calcium: 9.7 mg/dL (ref 8.9–10.3)
Chloride: 102 mmol/L (ref 98–111)
Creatinine, Ser: 0.83 mg/dL (ref 0.61–1.24)
GFR, Estimated: >60 mL/min (ref >60)
Glucose, Bld: 106 mg/dL — ABNORMAL HIGH (ref 70–99)
Potassium: 4.2 mmol/L (ref 3.5–5.1)
Sodium: 135 mmol/L (ref 135–145)
Total Bilirubin: 0.8 mg/dL (ref 0.3–1.2)
Total Protein: 6.9 g/dL (ref 6.5–8.1)

## 2022-11-12 LAB — CBC WITH DIFFERENTIAL/PLATELET
Abs Immature Granulocytes: 0.02 10*3/uL (ref 0.00–0.07)
Basophils Absolute: 0.1 10*3/uL (ref 0.0–0.1)
Basophils Relative: 1 %
Eosinophils Absolute: 0.2 10*3/uL (ref 0.0–0.5)
Eosinophils Relative: 3 %
HCT: 33.9 % — ABNORMAL LOW (ref 39.0–52.0)
Hemoglobin: 10.5 g/dL — ABNORMAL LOW (ref 13.0–17.0)
Immature Granulocytes: 0 %
Lymphocytes Relative: 14 %
Lymphs Abs: 1 10*3/uL (ref 0.7–4.0)
MCH: 32.1 pg (ref 26.0–34.0)
MCHC: 31 g/dL (ref 30.0–36.0)
MCV: 103.7 fL — ABNORMAL HIGH (ref 80.0–100.0)
Monocytes Absolute: 0.8 10*3/uL (ref 0.1–1.0)
Monocytes Relative: 11 %
Neutro Abs: 5 10*3/uL (ref 1.7–7.7)
Neutrophils Relative %: 71 %
Platelets: 289 10*3/uL (ref 150–400)
RBC: 3.27 MIL/uL — ABNORMAL LOW (ref 4.22–5.81)
RDW: 14.2 % (ref 11.5–15.5)
WBC: 7.1 10*3/uL (ref 4.0–10.5)
nRBC: 0 % (ref 0.0–0.2)

## 2022-11-12 LAB — MAGNESIUM: Magnesium: 1.5 mg/dL — ABNORMAL LOW (ref 1.7–2.4)

## 2022-11-12 MED ORDER — METHYLPREDNISOLONE SODIUM SUCC 125 MG IJ SOLR
125.0000 mg | Freq: Once | INTRAMUSCULAR | Status: AC
Start: 2022-11-12 — End: 2022-11-12
  Administered 2022-11-12: 125 mg via INTRAVENOUS
  Filled 2022-11-12: qty 2

## 2022-11-12 MED ORDER — SODIUM CHLORIDE 0.9% FLUSH
10.0000 mL | INTRAVENOUS | Status: DC | PRN
  Administered 2022-11-12: 10 mL

## 2022-11-12 MED ORDER — SODIUM CHLORIDE 0.9% FLUSH
10.0000 mL | INTRAVENOUS | Status: DC | PRN
  Administered 2022-11-12: 10 mL via INTRAVENOUS

## 2022-11-12 MED ORDER — DIPHENHYDRAMINE HCL 50 MG/ML IJ SOLN
50.0000 mg | Freq: Once | INTRAMUSCULAR | Status: AC
  Administered 2022-11-12: 50 mg via INTRAVENOUS
  Filled 2022-11-12: qty 1

## 2022-11-12 MED ORDER — FAMOTIDINE IN NACL 20-0.9 MG/50ML-% IV SOLN
20.0000 mg | Freq: Once | INTRAVENOUS | Status: AC
  Administered 2022-11-12: 20 mg via INTRAVENOUS
  Filled 2022-11-12: qty 50

## 2022-11-12 MED ORDER — SODIUM CHLORIDE 0.9 % IV SOLN: INTRAVENOUS | Status: DC

## 2022-11-12 MED ORDER — CETUXIMAB CHEMO IV INJECTION 200 MG/100ML
250.0000 mg/m2 | Freq: Once | INTRAVENOUS | Status: AC
  Administered 2022-11-12: 500 mg via INTRAVENOUS
  Filled 2022-11-12: qty 200

## 2022-11-12 MED ORDER — HEPARIN SOD (PORK) LOCK FLUSH 100 UNIT/ML IV SOLN
500.0000 [IU] | Freq: Once | INTRAVENOUS | Status: AC | PRN
Start: 2022-11-12 — End: 2022-11-12
  Administered 2022-11-12: 500 [IU]

## 2022-11-12 MED ORDER — MAGNESIUM SULFATE 2 GM/50ML IV SOLN
2.0000 g | Freq: Once | INTRAVENOUS | Status: AC
  Administered 2022-11-12: 2 g via INTRAVENOUS
  Filled 2022-11-12: qty 50

## 2022-11-12 MED ORDER — SODIUM CHLORIDE 0.9 % IV SOLN: Freq: Once | INTRAVENOUS | Status: AC

## 2022-11-12 NOTE — Progress Notes (Signed)
Patients port flushed without difficulty.  Good blood return noted with no bruising or swelling noted at site.  Patient remains accessed for treatment.  

## 2022-11-12 NOTE — Patient Instructions (Signed)
[facility]  Discharge Instructions: Thank you for choosing [facility] to provide your oncology and hematology care.  If you have a lab appointment with the Cancer Center - please note that after April 8th, 2024, all labs will be drawn in the cancer center.  You do not have to check in or register with the main entrance as you have in the past but will complete your check-in in the cancer center.  Wear comfortable clothing and clothing appropriate for easy access to any Portacath or PICC line.   We strive to give you quality time with your provider. You may need to reschedule your appointment if you arrive late (15 or more minutes).  Arriving late affects you and other patients whose appointments are after yours.  Also, if you miss three or more appointments without notifying the office, you may be dismissed from the clinic at the provider's discretion.      For prescription refill requests, have your pharmacy contact our office and allow 72 hours for refills to be completed.    Today you received the following chemotherapy and/or immunotherapy agents erbitux      To help prevent nausea and vomiting after your treatment, we encourage you to take your nausea medication as directed.  BELOW ARE SYMPTOMS THAT SHOULD BE REPORTED IMMEDIATELY: *FEVER GREATER THAN 100.4 F (38 C) OR HIGHER *CHILLS OR SWEATING *NAUSEA AND VOMITING THAT IS NOT CONTROLLED WITH YOUR NAUSEA MEDICATION *UNUSUAL SHORTNESS OF BREATH *UNUSUAL BRUISING OR BLEEDING *URINARY PROBLEMS (pain or burning when urinating, or frequent urination) *BOWEL PROBLEMS (unusual diarrhea, constipation, pain near the anus) TENDERNESS IN MOUTH AND THROAT WITH OR WITHOUT PRESENCE OF ULCERS (sore throat, sores in mouth, or a toothache) UNUSUAL RASH, SWELLING OR PAIN  UNUSUAL VAGINAL DISCHARGE OR ITCHING   Items with * indicate a potential emergency and should be followed up as soon as possible or go to the  Emergency Department if any problems should occur.  Please show the CHEMOTHERAPY ALERT CARD or IMMUNOTHERAPY ALERT CARD at check-in to the Emergency Department and triage nurse.  Should you have questions after your visit or need to cancel or reschedule your appointment, please contact [facility] [phone]  and follow the prompts.  Office hours are 8:00 a.m. to 4:30 p.m. Monday - Friday. Please note that voicemails left after 4:00 p.m. may not be returned until the following business day.  We are closed weekends and major holidays. You have access to a nurse at all times for urgent questions. Please call the main number to the clinic [phone] and follow the prompts.  For any non-urgent questions, you may also contact your provider using MyChart. We now offer e-Visits for anyone 75 and older to request care online for non-urgent symptoms. For details visit [email or web address].   Also download the MyChart app! Go to the app store, search "MyChart", open the app, select [place], and log in with your MyChart username and password.

## 2022-11-12 NOTE — Progress Notes (Signed)
Magnesium 1.5 today.  Magnesium 2 gms IV per standing orders Dr. Ellin Saba.   Patient tolerated chemotherapy with no complaints voiced.  Side effects with management reviewed with understanding verbalized.  Port site clean and dry with no bruising or swelling noted at site.  Good blood return noted before and after administration of chemotherapy.  Band aid applied.  Patient left in satisfactory condition with VSS and no s/s of distress noted.

## 2022-11-12 NOTE — Progress Notes (Signed)
OK to treat with labs - Mag 1.5 - Patient will receive Magnesium Sulfate 2 gm IVP  T.O. Dr Carilyn Goodpasture, PharmD

## 2022-11-18 DIAGNOSIS — I10 Essential (primary) hypertension: Secondary | ICD-10-CM | POA: Diagnosis not present

## 2022-11-18 DIAGNOSIS — E782 Mixed hyperlipidemia: Secondary | ICD-10-CM | POA: Diagnosis not present

## 2022-11-18 DIAGNOSIS — M1990 Unspecified osteoarthritis, unspecified site: Secondary | ICD-10-CM | POA: Diagnosis not present

## 2022-11-18 DIAGNOSIS — E559 Vitamin D deficiency, unspecified: Secondary | ICD-10-CM | POA: Diagnosis not present

## 2022-11-18 DIAGNOSIS — D539 Nutritional anemia, unspecified: Secondary | ICD-10-CM | POA: Diagnosis not present

## 2022-11-18 DIAGNOSIS — D1730 Benign lipomatous neoplasm of skin and subcutaneous tissue of unspecified sites: Secondary | ICD-10-CM | POA: Diagnosis not present

## 2022-11-18 DIAGNOSIS — R7301 Impaired fasting glucose: Secondary | ICD-10-CM | POA: Diagnosis not present

## 2022-11-18 DIAGNOSIS — Z23 Encounter for immunization: Secondary | ICD-10-CM | POA: Diagnosis not present

## 2022-11-18 DIAGNOSIS — R252 Cramp and spasm: Secondary | ICD-10-CM | POA: Diagnosis not present

## 2022-11-18 DIAGNOSIS — G629 Polyneuropathy, unspecified: Secondary | ICD-10-CM | POA: Diagnosis not present

## 2022-11-18 DIAGNOSIS — M545 Low back pain, unspecified: Secondary | ICD-10-CM | POA: Diagnosis not present

## 2022-11-18 NOTE — Progress Notes (Signed)
[facility] [address] [place] [address]    Clinic Day:  11/19/2022  Referring physician: [name], MD  Patient Care Team: [name], MD as PCP - General (Internal Medicine) [name], MD as Consulting Physician (Urology) [name], MD as Medical Oncologist (Medical Oncology) [name], RN as Oncology Nurse Navigator (Medical Oncology)   ASSESSMENT & PLAN:   Assessment: 1.  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  Enlarged contralateral right submandibular node.  Enlargement of the left submandibular gland probably reactive.  No abscess. - Biopsy (07/18/2021) floor of the mouth: Invasive well to moderately differentiated keratinizing squamous cell carcinoma.  Tumor cells negative for p16. - 10/14/2021: Floor of the mouth resection, tracheostomy, bilateral selective neck dissections zones 1-3 by Dr. [name] - Pathology: Invasive moderately differentiated keratinizing SCC, 4.1 cm, carcinoma invades for a depth of about 1.9 cm and involves saliva gland tissue.  Anterior, posterior, right, left resection margins are involved.  Deep resection margin is negative.  Metastatic carcinoma 1/3 level 1 lymph nodes.  3 level 2 and 3 level 3 lymph nodes negative for carcinoma.  0/10 lymph nodes involved in the left side neck zone 1, 2, 3 dissection.  ENE not identified.  LVI/perineural invasion not identified. - I have talked to pathologist.  Reexcision margins were considered negative.  He has a high risk feature which is T4 tumor. -  PET scan on 12/12/2021: Soft tissue thickening and calcification along the ventral aspect of the trachea with associated hypermetabolism corresponding to recent tracheostomy site.  7 mm right  paratracheal lymph node new with SUV 2.4.  Hypermetabolism along the dome of the right hepatic lobe with probable 2.4 cm low-attenuation lesion - Liver lesion biopsy (01/28/2022): Metastatic squamous cell carcinoma, keratinizing. - NGS testing: PD-L1 (22 C3): CPS: 100, CD274 (PD-L1) amplified, JAK2 amplified, PIK3CA pathogenic variant exon 10, T p53 pathogenic variant, MS-stable, TMB-low - Per Dr. [name], he also has local recurrence of disease in the floor of the mouth. - Keytruda from 03/10/2022 through 08/24/2022 with progression - XRT to the floor of the mouth on 06/29/2022 - PET scan 09/10/2022: Liver lesion increased in size measures 3.1 x 2.9 cm, previously 2.6 x 1.7 cm.  Right cervical lymph node meta stasis, mildly progressive. - HER2 by IHC 0 on biopsy from 01/28/2022. - Weekly cetuximab started on 10/07/2022   2.  Social/family history: - He lives in his apartment by himself and is independent of ADLs and IADLs.  He does not drive.  He worked as a Curator and several other jobs.  Quit smoking 1 month ago.  He smoked 1 pack/week for more than 50 years. - 1 brother had agent orange related cancer.  Another brother also had cancer, type unknown to the patient.   3.  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 - He was on close surveillance rather than adjuvant chemotherapy.    Plan: 1.  Stage IV (PT4PN1 M1) SCC of the floor of the mouth, p16 negative: - PET scan on 09/10/2022 showed progression. - He was started on weekly cetuximab which she is tolerating well. - He has acneform rash on the face which is stable.  Overall he reports that he is  feeling well.  He has mouth soreness.  I have recommended continue using lidocaine gel. - Reviewed labs today: Normal LFTs and creatinine.  CBC grossly normal. - Continue weekly cetuximab.  RTC 6 weeks for follow-up.  Repeat PET scan prior to next visit.   2.  Oral pain: - Continue liquid hydrocodone twice daily as  needed.   3.  Cetuximab skin rash: - Continue doxycycline 100 mg twice daily. - Continue clindamycin gel twice daily.   4.  Hypotension: - Continue to hold antihypertensives.  Blood pressure today is 106/59.  5.  Hypomagnesemia: - He is currently taking magnesium once daily.  Will increase magnesium to 3 times daily.  He will try to get a liquid preparation from [employer].    Orders Placed This Encounter  Procedures   NM PET Image Restage (PS) Skull Base to Thigh (F-18 FDG)    Standing Status:   Future    Standing Expiration Date:   11/19/2023    Order Specific Question:   If indicated for the ordered procedure, I authorize the administration of a radiopharmaceutical per Radiology protocol    Answer:   Yes    Order Specific Question:   Preferred imaging location?    Answer:   [name]   CBC with Differential    Standing Status:   Future    Standing Expiration Date:   12/31/2023   Comprehensive metabolic panel    Standing Status:   Future    Standing Expiration Date:   12/31/2023   Magnesium    Standing Status:   Future    Standing Expiration Date:   12/31/2023   CBC with Differential    Standing Status:   Future    Standing Expiration Date:   01/07/2024   Comprehensive metabolic panel    Standing Status:   Future    Standing Expiration Date:   01/07/2024   Magnesium    Standing Status:   Future    Standing Expiration Date:   01/07/2024   CBC with Differential    Standing Status:   Future    Standing Expiration Date:   01/14/2024   Comprehensive metabolic panel    Standing Status:   Future    Standing Expiration Date:   01/14/2024   Magnesium    Standing Status:   Future    Standing Expiration Date:   01/14/2024       [name],acting as a scribe for [name], MD.,have documented all relevant documentation on the behalf of [name], MD,as directed by  [name], MD while in the presence of [name], MD.   I, [name] MD, have reviewed the above documentation for accuracy and completeness, and I agree with the above.      [name], MD   11/7/20246:19 PM  CHIEF COMPLAINT:   Diagnosis: squamous cell carcinoma of the floor of the mouth and testicular seminoma    Cancer Staging  Cancer of floor of mouth [facility]) Staging form: Oral Cavity, AJCC 8th Edition - Clinical stage from 11/25/2021: Stage IVC (cT4a, cN1, pM1) - Signed by [name], MD on 02/04/2022  Testicle cancer, right Staging form: Testis, AJCC 7th Edition - Clinical: Stage I (T2, N0, M0) - Signed by [name], PA-C on 07/23/2013    Prior Therapy: 1. Pembrolizumab, 03/10/22 - 08/24/22 2. XRT to floor of mouth, 06/17/22 - 08/19/22  Current Therapy: Cetuximab weekly   HISTORY OF PRESENT ILLNESS:   Oncology History  Testicle  cancer, right  12/20/2012 Surgery   Right total orchiectomyt- 1. Testis, biopsy, right - SEMINOMA. PLEASE SEE COMMENT. 2. Testis, tumor, right - SEMINOMA, 7.5 CM. - ANGIOLYMPHATIC INVASION PRESENT. - RESECTION MARGINS, NEGATIVE FOR ATYPIA OR MALIGNANCY.   01/18/2013 PET scan   Status post right orchiectomy. No findings specific for metastatic disease. Small para-aortic nodes measuring up to 5 mm short axis, without convincing hypermetabolism. Given location, attention on follow-up is suggested.   04/17/2013 Imaging   CT CAP- No evidence of metastatic disease in the chest, abdomen or pelvis. Proximal LAD coronary artery calcification.   07/20/2013 Imaging   CT abd/pelvis- No evidence of metastatic disease in the abdomen or pelvis.   10/24/2013 Imaging   CT abd/pelvis- No findings to suggest metastatic disease in the abdomen or pelvis   10/24/2013 Imaging   Chest xray- Probable COPD.  Negative for metastatic disease.   02/21/2014 Imaging   CT abd/pelvis- Stable abdominal pelvic CT status post right orchectomy. No evidence of adenopathy or other metastatic disease.   02/21/2014 Imaging    Chest xray- Left lower lobe mild atelectasis and/or infiltrate.     09/10/2014 Imaging   CT abd/pelvis- Status post right orchiectomy.   No evidence of metastatic disease.   3 mm nonobstructing right upper pole renal calculus. No hydronephrosis.   03/14/2015 Imaging   CT abd/pelvis- Stable exam. No evidence of metastatic disease or other acute findings within the abdomen or pelvis.   09/13/2015 Imaging   CT abd/pelvis- No acute findings and no evidence for mass or adenopathy.    04/24/2016 Imaging   CT abd/pelvis: IMPRESSION: 1. Stable exam. No new or progressive findings. No features to suggest metastatic disease.   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HCC)  10/14/2021 Initial Diagnosis   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HCC)   11/25/2021 Cancer Staging   Staging form: Oral Cavity, AJCC 8th Edition - Clinical stage from 11/25/2021: Stage IVC (cT4a, cN1, pM1) - Signed by [name], MD on 02/04/2022 Histopathologic type: Squamous cell carcinoma, NOS Stage prefix: Initial diagnosis Histologic grade (G): G2 Histologic grading system: 3 grade system   03/10/2022 - 08/24/2022 Chemotherapy   Patient is on Treatment Plan : HEAD/NECK Pembrolizumab (200) q21d     10/07/2022 -  Chemotherapy   Patient is on Treatment Plan : HEAD/NECK Cetuximab q7d        INTERVAL HISTORY:   Doy is a 82 y.o. male presenting to clinic today for follow up of squamous cell carcinoma of the floor of the mouth and testicular seminoma. He was last seen by me on 10/29/22.  Today, he states that he is doing well overall. His appetite level is at 75%. His energy level is at 50%. He is accompanied by a family member. He reports a burning sensation on the tongue and mouth soreness in the lower jaw. Symptoms are slowly improving since his last visit. He notes one episode of Acneiform eruption on the face, that quickly resolved. He is taking doxycycline that effectively treats rash. He denies any diarrhea. He takes Magnesium OD  and liquid hydrocodone. He has not had to take lidocaine recently. He has discontinued blood pressure medication.   PAST MEDICAL HISTORY:   Past Medical History: Past Medical History:  Diagnosis Date   Arthritis    BPH (benign prostatic hyperplasia)    Difficult intubation    HOH (hard of hearing)    Hyperlipidemia 11/02/2017   Hypertension    Hypertension 11/02/2017   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HCC)  2014   Vitamin D deficiency 11/02/2017    Surgical History: Past Surgical History:  Procedure Laterality Date   COLONOSCOPY N/A 11/24/2012   Procedure: COLONOSCOPY;  Surgeon: Malissa Hippo, MD;  Location: [facility];  Service: Endoscopy;  Laterality: N/A;  830-moved to 730 Ann notified pt   FLOOR OF MOUTH BIOPSY N/A 10/14/2021   Procedure: FLOOR OF MOUTH RESECTION;  Surgeon: Christia Reading, MD;  Location: [facility] OR;  Service: ENT;  Laterality: N/A;   HEMORROIDECTOMY     KNEE ARTHROSCOPY WITH LATERAL MENISECTOMY Right 08/31/2017   Procedure: KNEE ARTHROSCOPY WITH LATERAL MENISECTOMY;  Surgeon: Vickki Hearing, MD;  Location: [facility] ORS;  Service: Orthopedics;  Laterality: Right;   LESION EXCISION N/A 03/23/2012   Procedure: EXCISION NEOPLASM SCALP ;  Surgeon: Dalia Heading, MD;  Location: [facility];  Service: General;  Laterality: N/A;  Excision of Scalp Neoplasm   ORCHIECTOMY Right 12/20/2012   Procedure: RIGHT RADICAL ORCHIECTOMY/POSSIBLE BX RIGHT TESTICLE;  Surgeon: Ky Barban, MD;  Location: [facility];  Service: Urology;  Laterality: Right;   PORTACATH PLACEMENT Left 03/25/2022   Procedure: INSERTION PORT-A-CATH;  Surgeon: Franky Macho, MD;  Location: [facility];  Service: General;  Laterality: Left;   PROSTATE SURGERY     RADICAL NECK DISSECTION Bilateral 10/14/2021   Procedure: NECK DISSECTION;  Surgeon: Christia Reading, MD;  Location: [facility] OR;  Service: ENT;  Laterality: Bilateral;   SCALP LACERATION REPAIR     [facility]-Dr Katrinka Blazing   SKIN FULL THICKNESS GRAFT Bilateral 10/14/2021   Procedure:  PLATYSMA FLAP CLOSURE;  Surgeon: Christia Reading, MD;  Location: [facility] OR;  Service: ENT;  Laterality: Bilateral;   TOOTH EXTRACTION  10/14/2021   Procedure: DENTAL EXTRACTIONS;  Surgeon: Christia Reading, MD;  Location: [facility] OR;  Service: ENT;;   TRACHEOSTOMY TUBE PLACEMENT N/A 10/14/2021   Procedure: TRACHEOSTOMY;  Surgeon: Christia Reading, MD;  Location: [facility] OR;  Service: ENT;  Laterality: N/A;    Social History: Social History   Socioeconomic History   Marital status: Single    Spouse name: Not on file   Number of children: Not on file   Years of education: Not on file   Highest education level: Not on file  Occupational History   Not on file  Tobacco Use   Smoking status: Former    Current packs/day: 0.25    Average packs/day: 0.3 packs/day for 50.0 years (12.5 ttl pk-yrs)    Types: Cigarettes   Smokeless tobacco: Never  Vaping Use   Vaping status: Never Used  Substance and Sexual Activity   Alcohol use: Not Currently   Drug use: No   Sexual activity: Yes    Birth control/protection: None  Other Topics Concern   Not on file  Social History Narrative   Not on file   Social Determinants of Health   Financial Resource Strain: Not on file  Food Insecurity: Not on file  Transportation Needs: Not on file  Physical Activity: Not on file  Stress: Not on file  Social Connections: Not on file  Intimate Partner Violence: Not on file    Family History: Family History  Problem Relation Age of Onset   Cancer Brother    Cancer Brother     Current Medications:  Current Outpatient Medications:    ciprofloxacin-dexamethasone (CIPRODEX) OTIC suspension, Place 4 drops into the left ear 2 (two) times daily., Disp: , Rfl:    acetaminophen (TYLENOL) 650 MG CR tablet, Take 650 mg by mouth every 8 (eight)  hours as needed for pain., Disp: , Rfl:    amLODipine-olmesartan (AZOR) 5-20 MG tablet, TAKE 1 TABLET BY MOUTH DAILY, Disp: 90 tablet, Rfl: 0   budesonide-formoterol (SYMBICORT) 80-4.5  MCG/ACT inhaler, Inhale 2 puffs into the lungs in the morning and at bedtime., Disp: 1 each, Rfl: 12   Cholecalciferol (VITAMIN D3) 50 MCG (2000 UT) TABS, Take 2,000 Units by mouth daily., Disp: , Rfl:    clindamycin (CLINDAGEL) 1 % gel, Apply topically 2 (two) times daily. Apply twice daily to chest rash, Disp: 30 g, Rfl: 0   doxycycline (VIBRA-TABS) 100 MG tablet, Take 1 tablet (100 mg total) by mouth 2 (two) times daily., Disp: 60 tablet, Rfl: 3   HYDROcodone-acetaminophen (HYCET) 7.5-325 mg/15 ml solution, Take 15 mLs by mouth every 6 (six) hours as needed for moderate pain (pain score 4-6)., Disp: 473 mL, Rfl: 0   lidocaine (XYLOCAINE) 2 % solution, Take by mouth., Disp: , Rfl:    magnesium oxide (MAG-OX) 400 MG tablet, Take 1 tablet (400 mg total) by mouth in the morning, at noon, and at bedtime., Disp: 90 tablet, Rfl: 3   Menthol, Topical Analgesic, (BIOFREEZE EX), Apply 1 application  topically daily as needed (pain)., Disp: , Rfl:    naloxone (NARCAN) nasal spray 4 mg/0.1 mL, SMARTSIG:Both Nares, Disp: , Rfl:    Polyethyl Glycol-Propyl Glycol (GOODSENSE LUBRICANT EYE DROPS OP), Place 1 drop into both eyes daily as needed (dry eyes)., Disp: , Rfl:    pravastatin (PRAVACHOL) 80 MG tablet, Take 80 mg by mouth daily., Disp: , Rfl:    predniSONE (DELTASONE) 10 MG tablet, Take  4 each am x 2 days,   2 each am x 2 days,  1 each am x 2 days and stop, Disp: 14 tablet, Rfl: 0   RAPAFLO 8 MG CAPS capsule, Take 8 mg by mouth daily. Silodosin, Disp: , Rfl:    vitamin B-12 (CYANOCOBALAMIN) 500 MCG tablet, Take 500 mcg by mouth daily., Disp: , Rfl:  No current facility-administered medications for this visit.  Facility-Administered Medications Ordered in Other Visits:    sodium chloride flush (NS) 0.9 % injection 10 mL, 10 mL, Intracatheter, PRN, [name], MD, 10 mL at 11/19/22 1458   Allergies: No Known Allergies  REVIEW OF SYSTEMS:   Review of Systems  Constitutional:  Negative for  chills, fatigue and fever.  HENT:   Positive for mouth sores (6/10 pain severity) and trouble swallowing. Negative for lump/mass, nosebleeds and sore throat.        +burning sensation on the tongue  Eyes:  Negative for eye problems.  Respiratory:  Negative for cough and shortness of breath.   Cardiovascular:  Negative for chest pain, leg swelling and palpitations.  Gastrointestinal:  Negative for abdominal pain, constipation, diarrhea, nausea and vomiting.  Genitourinary:  Negative for bladder incontinence, difficulty urinating, dysuria, frequency, hematuria and nocturia.   Musculoskeletal:  Negative for arthralgias, back pain, flank pain, myalgias and neck pain.  Skin:  Negative for itching and rash.  Neurological:  Negative for dizziness, headaches and numbness.  Hematological:  Does not bruise/bleed easily.  Psychiatric/Behavioral:  Negative for depression, sleep disturbance and suicidal ideas. The patient is not nervous/anxious.   All other systems reviewed and are negative.    VITALS:   Weight 149 lb 14.4 oz (68 kg).  Wt Readings from Last 3 Encounters:  11/19/22 149 lb 14.4 oz (68 kg)  11/12/22 151 lb (68.5 kg)  11/05/22 148 lb 8 oz (67.4 kg)  Body mass index is 20.91 kg/m.  Performance status (ECOG): 1 - Symptomatic but completely ambulatory  PHYSICAL EXAM:   Physical Exam Vitals and nursing note reviewed. Exam conducted with a chaperone present.  Constitutional:      Appearance: Normal appearance.  Cardiovascular:     Rate and Rhythm: Normal rate and regular rhythm.     Pulses: Normal pulses.     Heart sounds: Normal heart sounds.  Pulmonary:     Effort: Pulmonary effort is normal.     Breath sounds: Normal breath sounds.  Abdominal:     Palpations: Abdomen is soft. There is no hepatomegaly, splenomegaly or mass.     Tenderness: There is no abdominal tenderness.  Musculoskeletal:     Right lower leg: No edema.     Left lower leg: No edema.  Lymphadenopathy:      Cervical: No cervical adenopathy.     Right cervical: No superficial, deep or posterior cervical adenopathy.    Left cervical: No superficial, deep or posterior cervical adenopathy.     Upper Body:     Right upper body: No supraclavicular or axillary adenopathy.     Left upper body: No supraclavicular or axillary adenopathy.  Neurological:     General: No focal deficit present.     Mental Status: He is alert and oriented to person, place, and time.  Psychiatric:        Mood and Affect: Mood normal.        Behavior: Behavior normal.     LABS:      Latest Ref Rng & Units 11/19/2022    9:03 AM 11/12/2022    8:32 AM 11/05/2022    8:34 AM  CBC  WBC 4.0 - 10.5 K/uL 6.3  7.1  6.0   Hemoglobin 13.0 - 17.0 g/dL 28.4  13.2  44.0   Hematocrit 39.0 - 52.0 % 35.0  33.9  33.3   Platelets 150 - 400 K/uL 291  289  305       Latest Ref Rng & Units 11/19/2022    9:03 AM 11/12/2022    8:32 AM 11/05/2022    8:34 AM  CMP  Glucose 70 - 99 mg/dL 99  102  95   BUN 8 - 23 mg/dL 8  15  15    Creatinine 0.61 - 1.24 mg/dL 7.25  3.66  4.40   Sodium 135 - 145 mmol/L 137  135  138   Potassium 3.5 - 5.1 mmol/L 4.0  4.2  4.2   Chloride 98 - 111 mmol/L 103  102  103   CO2 22 - 32 mmol/L 27  24  26    Calcium 8.9 - 10.3 mg/dL 9.7  9.7  9.9   Total Protein 6.5 - 8.1 g/dL 6.7  6.9  6.9   Total Bilirubin <1.2 mg/dL 0.5  0.8  0.7   Alkaline Phos 38 - 126 U/L 47  44  46   AST 15 - 41 U/L 15  17  17    ALT 0 - 44 U/L 14  17  19       Lab Results  Component Value Date   CEA 2.2 01/19/2013   /  CEA  Date Value Ref Range Status  01/19/2013 2.2 0.0 - 5.0 ng/mL Final    Comment:    Performed at [facility]   No results found for: "PSA1" No results found for: "HKV425" No results found for: "CAN125"  No results found for: "TOTALPROTELP", "ALBUMINELP", "A1GS", "A2GS", "  BETS", "BETA2SER", "GAMS", "MSPIKE", "SPEI" Lab Results  Component Value Date   TIBC 244 (L) 08/04/2022   TIBC 251  05/12/2022   FERRITIN 273 08/04/2022   FERRITIN 195 05/12/2022   IRONPCTSAT 19 08/04/2022   IRONPCTSAT 18 05/12/2022   Lab Results  Component Value Date   LDH 172 11/12/2020   LDH 175 10/26/2018   LDH 179 10/25/2013     STUDIES:   No results found.

## 2022-11-19 ENCOUNTER — Inpatient Hospital Stay (HOSPITAL_BASED_OUTPATIENT_CLINIC_OR_DEPARTMENT_OTHER): Payer: 59 | Admitting: Hematology

## 2022-11-19 ENCOUNTER — Inpatient Hospital Stay: Payer: 59 | Attending: Hematology

## 2022-11-19 ENCOUNTER — Inpatient Hospital Stay: Payer: 59

## 2022-11-19 VITALS — Wt 149.9 lb

## 2022-11-19 VITALS — BP 129/55 | HR 80 | Temp 99.0°F | Resp 17

## 2022-11-19 DIAGNOSIS — Z9079 Acquired absence of other genital organ(s): Secondary | ICD-10-CM | POA: Diagnosis not present

## 2022-11-19 DIAGNOSIS — Z87891 Personal history of nicotine dependence: Secondary | ICD-10-CM | POA: Insufficient documentation

## 2022-11-19 DIAGNOSIS — R21 Rash and other nonspecific skin eruption: Secondary | ICD-10-CM | POA: Diagnosis not present

## 2022-11-19 DIAGNOSIS — Z5111 Encounter for antineoplastic chemotherapy: Secondary | ICD-10-CM | POA: Diagnosis not present

## 2022-11-19 DIAGNOSIS — Z8547 Personal history of malignant neoplasm of testis: Secondary | ICD-10-CM | POA: Insufficient documentation

## 2022-11-19 DIAGNOSIS — C049 Malignant neoplasm of floor of mouth, unspecified: Secondary | ICD-10-CM | POA: Insufficient documentation

## 2022-11-19 DIAGNOSIS — I959 Hypotension, unspecified: Secondary | ICD-10-CM | POA: Diagnosis not present

## 2022-11-19 DIAGNOSIS — C787 Secondary malignant neoplasm of liver and intrahepatic bile duct: Secondary | ICD-10-CM | POA: Diagnosis not present

## 2022-11-19 LAB — COMPREHENSIVE METABOLIC PANEL
ALT: 14 U/L (ref 0–44)
AST: 15 U/L (ref 15–41)
Albumin: 3.2 g/dL — ABNORMAL LOW (ref 3.5–5.0)
Alkaline Phosphatase: 47 U/L (ref 38–126)
Anion gap: 7 (ref 5–15)
BUN: 8 mg/dL (ref 8–23)
CO2: 27 mmol/L (ref 22–32)
Calcium: 9.7 mg/dL (ref 8.9–10.3)
Chloride: 103 mmol/L (ref 98–111)
Creatinine, Ser: 0.75 mg/dL (ref 0.61–1.24)
GFR, Estimated: >60 mL/min (ref >60)
Glucose, Bld: 99 mg/dL (ref 70–99)
Potassium: 4 mmol/L (ref 3.5–5.1)
Sodium: 137 mmol/L (ref 135–145)
Total Bilirubin: 0.5 mg/dL (ref <1.2)
Total Protein: 6.7 g/dL (ref 6.5–8.1)

## 2022-11-19 LAB — CBC WITH DIFFERENTIAL/PLATELET
Abs Immature Granulocytes: 0.01 10*3/uL (ref 0.00–0.07)
Basophils Absolute: 0.1 10*3/uL (ref 0.0–0.1)
Basophils Relative: 1 %
Eosinophils Absolute: 0.3 10*3/uL (ref 0.0–0.5)
Eosinophils Relative: 4 %
HCT: 35 % — ABNORMAL LOW (ref 39.0–52.0)
Hemoglobin: 11 g/dL — ABNORMAL LOW (ref 13.0–17.0)
Immature Granulocytes: 0 %
Lymphocytes Relative: 14 %
Lymphs Abs: 0.9 10*3/uL (ref 0.7–4.0)
MCH: 32.5 pg (ref 26.0–34.0)
MCHC: 31.4 g/dL (ref 30.0–36.0)
MCV: 103.6 fL — ABNORMAL HIGH (ref 80.0–100.0)
Monocytes Absolute: 0.7 10*3/uL (ref 0.1–1.0)
Monocytes Relative: 11 %
Neutro Abs: 4.4 10*3/uL (ref 1.7–7.7)
Neutrophils Relative %: 70 %
Platelets: 291 10*3/uL (ref 150–400)
RBC: 3.38 MIL/uL — ABNORMAL LOW (ref 4.22–5.81)
RDW: 13.9 % (ref 11.5–15.5)
WBC: 6.3 10*3/uL (ref 4.0–10.5)
nRBC: 0 % (ref 0.0–0.2)

## 2022-11-19 LAB — MAGNESIUM: Magnesium: 1.3 mg/dL — ABNORMAL LOW (ref 1.7–2.4)

## 2022-11-19 MED ORDER — MAGNESIUM SULFATE 2 GM/50ML IV SOLN
2.0000 g | Freq: Once | INTRAVENOUS | Status: AC
  Administered 2022-11-19: 2 g via INTRAVENOUS
  Filled 2022-11-19: qty 50

## 2022-11-19 MED ORDER — DIPHENHYDRAMINE HCL 50 MG/ML IJ SOLN
50.0000 mg | Freq: Once | INTRAMUSCULAR | Status: AC
  Administered 2022-11-19: 50 mg via INTRAVENOUS
  Filled 2022-11-19: qty 1

## 2022-11-19 MED ORDER — SODIUM CHLORIDE 0.9% FLUSH
10.0000 mL | Freq: Once | INTRAVENOUS | Status: AC
  Administered 2022-11-19: 10 mL via INTRAVENOUS

## 2022-11-19 MED ORDER — FAMOTIDINE IN NACL 20-0.9 MG/50ML-% IV SOLN
20.0000 mg | Freq: Once | INTRAVENOUS | Status: AC
  Administered 2022-11-19: 20 mg via INTRAVENOUS
  Filled 2022-11-19: qty 50

## 2022-11-19 MED ORDER — HEPARIN SOD (PORK) LOCK FLUSH 100 UNIT/ML IV SOLN
500.0000 [IU] | Freq: Once | INTRAVENOUS | Status: AC | PRN
  Administered 2022-11-19: 500 [IU]

## 2022-11-19 MED ORDER — MAGNESIUM OXIDE 400 MG PO TABS: 400.0000 mg | ORAL_TABLET | Freq: Three times a day (TID) | ORAL | 3 refills | Status: DC

## 2022-11-19 MED ORDER — CETUXIMAB CHEMO IV INJECTION 100 MG/50ML
250.0000 mg/m2 | Freq: Once | INTRAVENOUS | Status: AC
Start: 2022-11-19 — End: 2022-11-19
  Administered 2022-11-19: 500 mg via INTRAVENOUS
  Filled 2022-11-19: qty 200

## 2022-11-19 MED ORDER — SODIUM CHLORIDE 0.9% FLUSH
10.0000 mL | INTRAVENOUS | Status: DC | PRN
  Administered 2022-11-19: 10 mL

## 2022-11-19 MED ORDER — SODIUM CHLORIDE 0.9 % IV SOLN: Freq: Once | INTRAVENOUS | Status: AC

## 2022-11-19 MED ORDER — METHYLPREDNISOLONE SODIUM SUCC 125 MG IJ SOLR
125.0000 mg | Freq: Once | INTRAMUSCULAR | Status: AC
  Administered 2022-11-19: 125 mg via INTRAVENOUS
  Filled 2022-11-19: qty 2

## 2022-11-19 NOTE — Patient Instructions (Signed)
[facility] - A DEPT OF [facility]  Discharge Instructions: Thank you for choosing [facility] to provide your oncology and hematology care.  If you have a lab appointment with the Cancer Center - please note that after April 8th, 2024, all labs will be drawn in the cancer center.  You do not have to check in or register with the main entrance as you have in the past but will complete your check-in in the cancer center.  Wear comfortable clothing and clothing appropriate for easy access to any Portacath or PICC line.   We strive to give you quality time with your provider. You may need to reschedule your appointment if you arrive late (15 or more minutes).  Arriving late affects you and other patients whose appointments are after yours.  Also, if you miss three or more appointments without notifying the office, you may be dismissed from the clinic at the provider's discretion.      For prescription refill requests, have your pharmacy contact our office and allow 72 hours for refills to be completed.    Today you received the following chemotherapy and/or immunotherapy agents Erbitux.  Cetuximab Injection What is this medication? CETUXIMAB (se TUX i mab) treats head and neck cancer. It may also be used to treat colorectal cancer. It works by blocking a protein that causes cancer cells to grow and multiply. This helps to slow or stop the spread of cancer cells. It is a monoclonal antibody. This medicine may be used for other purposes; ask your health care provider or pharmacist if you have questions. COMMON BRAND NAME(S): Erbitux What should I tell my care team before I take this medication? They need to know if you have any of these conditions: Heart disease History of tick bites Low levels of calcium, magnesium, or potassium in the blood Lung disease Red meat allergy An unusual or allergic reaction to cetuximab, other medications, foods, dyes,  or preservatives Pregnant or trying to get pregnant Breast-feeding How should I use this medication? This medication is infused into a vein. It is given by your care team in a hospital or clinic setting. Talk to your care team about the use of this medication in children. Special care may be needed. Overdosage: If you think you have taken too much of this medicine contact a poison control center or emergency room at once. NOTE: This medicine is only for you. Do not share this medicine with others. What if I miss a dose? Keep appointments for follow-up doses. It is important not to miss your dose. Call your care team if you are unable to keep an appointment. What may interact with this medication? Interactions are not expected. This list may not describe all possible interactions. Give your health care provider a list of all the medicines, herbs, non-prescription drugs, or dietary supplements you use. Also tell them if you smoke, drink alcohol, or use illegal drugs. Some items may interact with your medicine. What should I watch for while using this medication? Visit your care team for regular checks on your progress. This medication may make you feel generally unwell. This is not uncommon, as chemotherapy can affect healthy cells as well as cancer cells. Report any side effects. Continue your course of treatment even though you feel ill unless your care team tells you to stop. You may need blood work done while you are taking this medication. This medication can make you more sensitive to  the sun. Keep out of the sun while taking this medication and for 2 months after the last dose. If you cannot avoid being in the sun, wear protective clothing and sunscreen. Do not use sun lamps, tanning beds, or tanning booths. This medication can cause serious infusion reactions. To reduce the risk, your care team may give you other medications to take before receiving this one. Be sure to follow the directions of  your care team. This medication may cause serious skin reactions. They can happen weeks to months after starting the medication. Contact your care team right away if you notice fevers or flu-like symptoms with a rash. The rash may be red or purple and then turn into blisters or peeling of the skin. You may also notice a red rash with swelling of the face, lips, or lymph nodes in your neck or under your arms. Talk to your care team if you may be pregnant. Serious birth defects can occur if you take this medication during pregnancy and for 2 months after the last dose. You will need a negative pregnancy test before starting this medication. Contraception is recommended while taking this medication and for 2 months after the last dose. Your care team can help you find the option that works for you. Do not breastfeed while taking this medication and for 2 months after the last dose. This medication may cause infertility. Talk to your care team if you are concerned about your fertility. What side effects may I notice from receiving this medication? Side effects that you should report to your care team as soon as possible: Allergic reactions--skin rash, itching, hives, swelling of the face, lips, tongue, or throat Dry cough, shortness of breath or trouble breathing Heart attack--pain or tightness in the chest, shoulders, arms, or jaw, nausea, shortness of breath, cold or clammy skin, feeling faint or lightheaded Infusion reactions--chest pain, shortness of breath or trouble breathing, feeling faint or lightheaded Low calcium level--muscle pain or cramps, confusion, tingling, or numbness in the hands or feet Low magnesium level--muscle pain or cramps, unusual weakness or fatigue, fast or irregular heartbeat, tremors Low potassium level--muscle pain or cramps, unusual weakness or fatigue, fast or irregular heartbeat, constipation Redness, blistering, peeling, or loosening of the skin, including inside the  mouth Side effects that usually do not require medical attention (report to your care team if they continue or are bothersome): Diarrhea Headache Joint pain Nausea Unusual weakness or fatigue Weight loss This list may not describe all possible side effects. Call your doctor for medical advice about side effects. You may report side effects to FDA at 1-800-FDA-1088. Where should I keep my medication? This medication is given in a hospital or clinic. It will not be stored at home. NOTE: This sheet is a summary. It may not cover all possible information. If you have questions about this medicine, talk to your doctor, pharmacist, or health care provider.  2024 Elsevier/Gold Standard (2021-05-16 00:00:00)        To help prevent nausea and vomiting after your treatment, we encourage you to take your nausea medication as directed.  BELOW ARE SYMPTOMS THAT SHOULD BE REPORTED IMMEDIATELY: *FEVER GREATER THAN 100.4 F (38 C) OR HIGHER *CHILLS OR SWEATING *NAUSEA AND VOMITING THAT IS NOT CONTROLLED WITH YOUR NAUSEA MEDICATION *UNUSUAL SHORTNESS OF BREATH *UNUSUAL BRUISING OR BLEEDING *URINARY PROBLEMS (pain or burning when urinating, or frequent urination) *BOWEL PROBLEMS (unusual diarrhea, constipation, pain near the anus) TENDERNESS IN MOUTH AND THROAT WITH OR WITHOUT PRESENCE  OF ULCERS (sore throat, sores in mouth, or a toothache) UNUSUAL RASH, SWELLING OR PAIN  UNUSUAL VAGINAL DISCHARGE OR ITCHING   Items with * indicate a potential emergency and should be followed up as soon as possible or go to the Emergency Department if any problems should occur.  Please show the CHEMOTHERAPY ALERT CARD or IMMUNOTHERAPY ALERT CARD at check-in to the Emergency Department and triage nurse.  Should you have questions after your visit or need to cancel or reschedule your appointment, please contact [facility] - A DEPT OF Eligha Bridegroom [facility] [phone]  and follow the prompts.   Office hours are 8:00 a.m. to 4:30 p.m. Monday - Friday. Please note that voicemails left after 4:00 p.m. may not be returned until the following business day.  We are closed weekends and major holidays. You have access to a nurse at all times for urgent questions. Please call the main number to the clinic [phone] and follow the prompts.  For any non-urgent questions, you may also contact your provider using MyChart. We now offer e-Visits for anyone 67 and older to request care online for non-urgent symptoms. For details visit [email or web address].   Also download the MyChart app! Go to the app store, search "MyChart", open the app, select [place], and log in with your MyChart username and password.

## 2022-11-19 NOTE — Progress Notes (Signed)
Patient has been examined by Dr. Katragadda. Vital signs and labs have been reviewed by MD - ANC, Creatinine, LFTs, hemoglobin, and platelets are within treatment parameters per M.D. - pt may proceed with treatment.  Primary RN and pharmacy notified.  

## 2022-11-19 NOTE — Progress Notes (Signed)
Patient presents today for chemotherapy infusion. Patient is in satisfactory condition with no new complaints voiced.  Vital signs are stable.  Labs reviewed by Dr. Ellin Saba during the office visit and all labs are within treatment parameters.  Magnesium today is 1.3.  We will give Magnesium sulfate 4 grams IV x one dose today per standing orders by Dr. Ellin Saba.  We will proceed with treatment per MD orders.   Patient tolerated treatment and magnesium infusion well with no complaints voiced.  Patient left via wheelchair with sister in stable condition.  Vital signs stable at discharge.  Follow up as scheduled.

## 2022-11-19 NOTE — Patient Instructions (Signed)
[facility] Discharge Instructions   You were seen and examined today by Dr. Ellin Saba.  He reviewed the results of your lab work which are mostly normal/stable. Your magnesium is low at 1.3. We will give you IV magnesium in the clinic today.   We will proceed with your treatment today.   We will repeat a scan the week before christmas to see how the treatment is working.   Return as scheduled.    Thank you for choosing [facility] at [facility] to provide your oncology and hematology care.  To afford each patient quality time with our provider, please arrive at least 15 minutes before your scheduled appointment time.   If you have a lab appointment with the Cancer Center please come in thru the Main Entrance and check in at the main information desk.  You need to re-schedule your appointment should you arrive 10 or more minutes late.  We strive to give you quality time with our providers, and arriving late affects you and other patients whose appointments are after yours.  Also, if you no show three or more times for appointments you may be dismissed from the clinic at the providers discretion.     Again, thank you for choosing [facility].  Our hope is that these requests will decrease the amount of time that you wait before being seen by our physicians.       _____________________________________________________________  Should you have questions after your visit to [facility], please contact our office at [phone] and follow the prompts.  Our office hours are 8:00 a.m. and 4:30 p.m. Monday - Friday.  Please note that voicemails left after 4:00 p.m. may not be returned until the following business day.  We are closed weekends and major holidays.  You do have access to a nurse 24-7, just call the main number to the clinic [phone] and do not press any options, hold on the line and a nurse will  answer the phone.    For prescription refill requests, have your pharmacy contact our office and allow 72 hours.    Due to Covid, you will need to wear a mask upon entering the hospital. If you do not have a mask, a mask will be given to you at the Main Entrance upon arrival. For doctor visits, patients may have 1 support person age 75 or older with them. For treatment visits, patients can not have anyone with them due to social distancing guidelines and our immunocompromised population.

## 2022-11-20 ENCOUNTER — Other Ambulatory Visit: Payer: Self-pay

## 2022-11-24 ENCOUNTER — Other Ambulatory Visit: Payer: Self-pay

## 2022-11-26 ENCOUNTER — Inpatient Hospital Stay: Payer: 59

## 2022-11-26 VITALS — BP 122/78 | HR 73 | Temp 97.8°F | Resp 18 | Wt 149.0 lb

## 2022-11-26 VITALS — BP 109/68 | HR 64 | Temp 98.1°F | Resp 18

## 2022-11-26 DIAGNOSIS — Z95828 Presence of other vascular implants and grafts: Secondary | ICD-10-CM

## 2022-11-26 DIAGNOSIS — C049 Malignant neoplasm of floor of mouth, unspecified: Secondary | ICD-10-CM

## 2022-11-26 DIAGNOSIS — I959 Hypotension, unspecified: Secondary | ICD-10-CM | POA: Diagnosis not present

## 2022-11-26 DIAGNOSIS — Z5111 Encounter for antineoplastic chemotherapy: Secondary | ICD-10-CM | POA: Diagnosis not present

## 2022-11-26 DIAGNOSIS — R21 Rash and other nonspecific skin eruption: Secondary | ICD-10-CM | POA: Diagnosis not present

## 2022-11-26 DIAGNOSIS — C787 Secondary malignant neoplasm of liver and intrahepatic bile duct: Secondary | ICD-10-CM | POA: Diagnosis not present

## 2022-11-26 DIAGNOSIS — Z87891 Personal history of nicotine dependence: Secondary | ICD-10-CM | POA: Diagnosis not present

## 2022-11-26 LAB — CBC WITH DIFFERENTIAL/PLATELET
Abs Immature Granulocytes: 0.01 10*3/uL (ref 0.00–0.07)
Basophils Absolute: 0.1 10*3/uL (ref 0.0–0.1)
Basophils Relative: 1 %
Eosinophils Absolute: 0.3 10*3/uL (ref 0.0–0.5)
Eosinophils Relative: 5 %
HCT: 37.7 % — ABNORMAL LOW (ref 39.0–52.0)
Hemoglobin: 12 g/dL — ABNORMAL LOW (ref 13.0–17.0)
Immature Granulocytes: 0 %
Lymphocytes Relative: 22 %
Lymphs Abs: 1.5 10*3/uL (ref 0.7–4.0)
MCH: 32.7 pg (ref 26.0–34.0)
MCHC: 31.8 g/dL (ref 30.0–36.0)
MCV: 102.7 fL — ABNORMAL HIGH (ref 80.0–100.0)
Monocytes Absolute: 0.7 10*3/uL (ref 0.1–1.0)
Monocytes Relative: 11 %
Neutro Abs: 4.2 10*3/uL (ref 1.7–7.7)
Neutrophils Relative %: 61 %
Platelets: 295 10*3/uL (ref 150–400)
RBC: 3.67 MIL/uL — ABNORMAL LOW (ref 4.22–5.81)
RDW: 13.4 % (ref 11.5–15.5)
WBC: 6.8 10*3/uL (ref 4.0–10.5)
nRBC: 0 % (ref 0.0–0.2)

## 2022-11-26 LAB — COMPREHENSIVE METABOLIC PANEL
ALT: 19 U/L (ref 0–44)
AST: 17 U/L (ref 15–41)
Albumin: 3.3 g/dL — ABNORMAL LOW (ref 3.5–5.0)
Alkaline Phosphatase: 49 U/L (ref 38–126)
Anion gap: 9 (ref 5–15)
BUN: 12 mg/dL (ref 8–23)
CO2: 25 mmol/L (ref 22–32)
Calcium: 9.8 mg/dL (ref 8.9–10.3)
Chloride: 103 mmol/L (ref 98–111)
Creatinine, Ser: 0.8 mg/dL (ref 0.61–1.24)
GFR, Estimated: >60 mL/min (ref >60)
Glucose, Bld: 110 mg/dL — ABNORMAL HIGH (ref 70–99)
Potassium: 4.2 mmol/L (ref 3.5–5.1)
Sodium: 137 mmol/L (ref 135–145)
Total Bilirubin: 0.6 mg/dL (ref <1.2)
Total Protein: 6.8 g/dL (ref 6.5–8.1)

## 2022-11-26 LAB — MAGNESIUM: Magnesium: 1.5 mg/dL — ABNORMAL LOW (ref 1.7–2.4)

## 2022-11-26 MED ORDER — SODIUM CHLORIDE 0.9 % IV SOLN: Freq: Once | INTRAVENOUS | Status: AC

## 2022-11-26 MED ORDER — FAMOTIDINE IN NACL 20-0.9 MG/50ML-% IV SOLN
20.0000 mg | Freq: Once | INTRAVENOUS | Status: AC
  Administered 2022-11-26: 20 mg via INTRAVENOUS
  Filled 2022-11-26: qty 50

## 2022-11-26 MED ORDER — HEPARIN SOD (PORK) LOCK FLUSH 100 UNIT/ML IV SOLN
500.0000 [IU] | Freq: Once | INTRAVENOUS | Status: AC | PRN
  Administered 2022-11-26: 500 [IU]

## 2022-11-26 MED ORDER — SODIUM CHLORIDE 0.9% FLUSH
10.0000 mL | INTRAVENOUS | Status: DC | PRN
Start: 2022-11-26 — End: 2022-11-26
  Administered 2022-11-26: 10 mL

## 2022-11-26 MED ORDER — DIPHENHYDRAMINE HCL 50 MG/ML IJ SOLN
50.0000 mg | Freq: Once | INTRAMUSCULAR | Status: AC
  Administered 2022-11-26: 50 mg via INTRAVENOUS
  Filled 2022-11-26: qty 1

## 2022-11-26 MED ORDER — MAGNESIUM SULFATE 2 GM/50ML IV SOLN
2.0000 g | Freq: Once | INTRAVENOUS | Status: AC
  Administered 2022-11-26: 2 g via INTRAVENOUS
  Filled 2022-11-26: qty 50

## 2022-11-26 MED ORDER — CETUXIMAB CHEMO IV INJECTION 200 MG/100ML
250.0000 mg/m2 | Freq: Once | INTRAVENOUS | Status: AC
Start: 2022-11-26 — End: 2022-11-26
  Administered 2022-11-26: 500 mg via INTRAVENOUS
  Filled 2022-11-26: qty 200

## 2022-11-26 MED ORDER — SODIUM CHLORIDE 0.9% FLUSH
10.0000 mL | Freq: Once | INTRAVENOUS | Status: AC
  Administered 2022-11-26: 10 mL via INTRAVENOUS

## 2022-11-26 MED ORDER — METHYLPREDNISOLONE SODIUM SUCC 125 MG IJ SOLR
125.0000 mg | Freq: Once | INTRAMUSCULAR | Status: AC
Start: 2022-11-26 — End: 2022-11-26
  Administered 2022-11-26: 125 mg via INTRAVENOUS
  Filled 2022-11-26: qty 2

## 2022-11-26 NOTE — Patient Instructions (Signed)
[facility]  Discharge Instructions: Thank you for choosing [facility] to provide your oncology and hematology care.  If you have a lab appointment with the Cancer Center - please note that after April 8th, 2024, all labs will be drawn in the cancer center.  You do not have to check in or register with the main entrance as you have in the past but will complete your check-in in the cancer center.  Wear comfortable clothing and clothing appropriate for easy access to any Portacath or PICC line.   We strive to give you quality time with your provider. You may need to reschedule your appointment if you arrive late (15 or more minutes).  Arriving late affects you and other patients whose appointments are after yours.  Also, if you miss three or more appointments without notifying the office, you may be dismissed from the clinic at the provider's discretion.      For prescription refill requests, have your pharmacy contact our office and allow 72 hours for refills to be completed.    Today you received the following chemotherapy and/or immunotherapy agents Erbitux    To help prevent nausea and vomiting after your treatment, we encourage you to take your nausea medication as directed.  Cetuximab Injection What is this medication? CETUXIMAB (se TUX i mab) treats head and neck cancer. It may also be used to treat colorectal cancer. It works by blocking a protein that causes cancer cells to grow and multiply. This helps to slow or stop the spread of cancer cells. It is a monoclonal antibody. This medicine may be used for other purposes; ask your health care provider or pharmacist if you have questions. COMMON BRAND NAME(S): Erbitux What should I tell my care team before I take this medication? They need to know if you have any of these conditions: Heart disease History of tick bites Low levels of calcium, magnesium, or potassium  in the blood Lung disease Red meat allergy An unusual or allergic reaction to cetuximab, other medications, foods, dyes, or preservatives Pregnant or trying to get pregnant Breast-feeding How should I use this medication? This medication is infused into a vein. It is given by your care team in a hospital or clinic setting. Talk to your care team about the use of this medication in children. Special care may be needed. Overdosage: If you think you have taken too much of this medicine contact a poison control center or emergency room at once. NOTE: This medicine is only for you. Do not share this medicine with others. What if I miss a dose? Keep appointments for follow-up doses. It is important not to miss your dose. Call your care team if you are unable to keep an appointment. What may interact with this medication? Interactions are not expected. This list may not describe all possible interactions. Give your health care provider a list of all the medicines, herbs, non-prescription drugs, or dietary supplements you use. Also tell them if you smoke, drink alcohol, or use illegal drugs. Some items may interact with your medicine. What should I watch for while using this medication? Visit your care team for regular checks on your progress. This medication may make you feel generally unwell. This is not uncommon, as chemotherapy can affect healthy cells as well as cancer cells. Report any side effects. Continue your course of treatment even though you feel ill unless your care team tells you  to stop. You may need blood work done while you are taking this medication. This medication can make you more sensitive to the sun. Keep out of the sun while taking this medication and for 2 months after the last dose. If you cannot avoid being in the sun, wear protective clothing and sunscreen. Do not use sun lamps, tanning beds, or tanning booths. This medication can cause serious infusion reactions. To reduce  the risk, your care team may give you other medications to take before receiving this one. Be sure to follow the directions of your care team. This medication may cause serious skin reactions. They can happen weeks to months after starting the medication. Contact your care team right away if you notice fevers or flu-like symptoms with a rash. The rash may be red or purple and then turn into blisters or peeling of the skin. You may also notice a red rash with swelling of the face, lips, or lymph nodes in your neck or under your arms. Talk to your care team if you may be pregnant. Serious birth defects can occur if you take this medication during pregnancy and for 2 months after the last dose. You will need a negative pregnancy test before starting this medication. Contraception is recommended while taking this medication and for 2 months after the last dose. Your care team can help you find the option that works for you. Do not breastfeed while taking this medication and for 2 months after the last dose. This medication may cause infertility. Talk to your care team if you are concerned about your fertility. What side effects may I notice from receiving this medication? Side effects that you should report to your care team as soon as possible: Allergic reactions--skin rash, itching, hives, swelling of the face, lips, tongue, or throat Dry cough, shortness of breath or trouble breathing Heart attack--pain or tightness in the chest, shoulders, arms, or jaw, nausea, shortness of breath, cold or clammy skin, feeling faint or lightheaded Infusion reactions--chest pain, shortness of breath or trouble breathing, feeling faint or lightheaded Low calcium level--muscle pain or cramps, confusion, tingling, or numbness in the hands or feet Low magnesium level--muscle pain or cramps, unusual weakness or fatigue, fast or irregular heartbeat, tremors Low potassium level--muscle pain or cramps, unusual weakness or  fatigue, fast or irregular heartbeat, constipation Redness, blistering, peeling, or loosening of the skin, including inside the mouth Side effects that usually do not require medical attention (report to your care team if they continue or are bothersome): Diarrhea Headache Joint pain Nausea Unusual weakness or fatigue Weight loss This list may not describe all possible side effects. Call your doctor for medical advice about side effects. You may report side effects to FDA at 1-800-FDA-1088. Where should I keep my medication? This medication is given in a hospital or clinic. It will not be stored at home. NOTE: This sheet is a summary. It may not cover all possible information. If you have questions about this medicine, talk to your doctor, pharmacist, or health care provider.  2024 Elsevier/Gold Standard (2021-05-16 00:00:00)   BELOW ARE SYMPTOMS THAT SHOULD BE REPORTED IMMEDIATELY: *FEVER GREATER THAN 100.4 F (38 C) OR HIGHER *CHILLS OR SWEATING *NAUSEA AND VOMITING THAT IS NOT CONTROLLED WITH YOUR NAUSEA MEDICATION *UNUSUAL SHORTNESS OF BREATH *UNUSUAL BRUISING OR BLEEDING *URINARY PROBLEMS (pain or burning when urinating, or frequent urination) *BOWEL PROBLEMS (unusual diarrhea, constipation, pain near the anus) TENDERNESS IN MOUTH AND THROAT WITH OR WITHOUT PRESENCE OF ULCERS (sore  throat, sores in mouth, or a toothache) UNUSUAL RASH, SWELLING OR PAIN  UNUSUAL VAGINAL DISCHARGE OR ITCHING   Items with * indicate a potential emergency and should be followed up as soon as possible or go to the Emergency Department if any problems should occur.  Please show the CHEMOTHERAPY ALERT CARD or IMMUNOTHERAPY ALERT CARD at check-in to the Emergency Department and triage nurse.  Should you have questions after your visit or need to cancel or reschedule your appointment, please contact [facility] - A DEPT OF Eligha Bridegroom [facility] [phone]  and follow the prompts.   Office hours are 8:00 a.m. to 4:30 p.m. Monday - Friday. Please note that voicemails left after 4:00 p.m. may not be returned until the following business day.  We are closed weekends and major holidays. You have access to a nurse at all times for urgent questions. Please call the main number to the clinic [phone] and follow the prompts.  For any non-urgent questions, you may also contact your provider using MyChart. We now offer e-Visits for anyone 64 and older to request care online for non-urgent symptoms. For details visit [email or web address].   Also download the MyChart app! Go to the app store, search "MyChart", open the app, select [place], and log in with your MyChart username and password.

## 2022-11-26 NOTE — Progress Notes (Addendum)
Patient presents today for Erbitux infusion. Patient is in satisfactory condition with no new complaints voiced.  Vital signs are stable. Labs reviewed and all labs are within treatment parameters. Patient will receive 2g IV magnesium sulfate x 1 dose per Dr.K's standing orders. We will proceed with treatment per MD orders.    Patient's sister stated patient has been complaining of dizziness when taking magnesium oxide p.o Dr.K prescribed last week. Dr.K made aware and no new orders were given.  Treatment given today per MD orders. Tolerated infusion without adverse affects. Vital signs stable. No complaints at this time. Discharged from clinic via wheelchair in stable condition. Alert and oriented x 3. F/U with [facility] as scheduled.

## 2022-12-01 ENCOUNTER — Other Ambulatory Visit: Payer: Self-pay

## 2022-12-03 ENCOUNTER — Inpatient Hospital Stay: Payer: 59

## 2022-12-03 ENCOUNTER — Other Ambulatory Visit: Payer: Self-pay | Admitting: *Deleted

## 2022-12-03 VITALS — BP 143/70 | HR 87 | Temp 97.6°F | Resp 18

## 2022-12-03 DIAGNOSIS — R21 Rash and other nonspecific skin eruption: Secondary | ICD-10-CM | POA: Diagnosis not present

## 2022-12-03 DIAGNOSIS — Z87891 Personal history of nicotine dependence: Secondary | ICD-10-CM | POA: Diagnosis not present

## 2022-12-03 DIAGNOSIS — I959 Hypotension, unspecified: Secondary | ICD-10-CM | POA: Diagnosis not present

## 2022-12-03 DIAGNOSIS — C049 Malignant neoplasm of floor of mouth, unspecified: Secondary | ICD-10-CM

## 2022-12-03 DIAGNOSIS — Z5111 Encounter for antineoplastic chemotherapy: Secondary | ICD-10-CM | POA: Diagnosis not present

## 2022-12-03 DIAGNOSIS — C787 Secondary malignant neoplasm of liver and intrahepatic bile duct: Secondary | ICD-10-CM | POA: Diagnosis not present

## 2022-12-03 LAB — CBC WITH DIFFERENTIAL/PLATELET
Abs Immature Granulocytes: 0.02 10*3/uL (ref 0.00–0.07)
Basophils Absolute: 0.1 10*3/uL (ref 0.0–0.1)
Basophils Relative: 1 %
Eosinophils Absolute: 0.3 10*3/uL (ref 0.0–0.5)
Eosinophils Relative: 4 %
HCT: 36.5 % — ABNORMAL LOW (ref 39.0–52.0)
Hemoglobin: 11.7 g/dL — ABNORMAL LOW (ref 13.0–17.0)
Immature Granulocytes: 0 %
Lymphocytes Relative: 19 %
Lymphs Abs: 1.3 10*3/uL (ref 0.7–4.0)
MCH: 33 pg (ref 26.0–34.0)
MCHC: 32.1 g/dL (ref 30.0–36.0)
MCV: 102.8 fL — ABNORMAL HIGH (ref 80.0–100.0)
Monocytes Absolute: 0.7 10*3/uL (ref 0.1–1.0)
Monocytes Relative: 10 %
Neutro Abs: 4.4 10*3/uL (ref 1.7–7.7)
Neutrophils Relative %: 66 %
Platelets: 254 10*3/uL (ref 150–400)
RBC: 3.55 MIL/uL — ABNORMAL LOW (ref 4.22–5.81)
RDW: 14 % (ref 11.5–15.5)
WBC: 6.7 10*3/uL (ref 4.0–10.5)
nRBC: 0 % (ref 0.0–0.2)

## 2022-12-03 LAB — COMPREHENSIVE METABOLIC PANEL
ALT: 17 U/L (ref 0–44)
AST: 17 U/L (ref 15–41)
Albumin: 3.2 g/dL — ABNORMAL LOW (ref 3.5–5.0)
Alkaline Phosphatase: 47 U/L (ref 38–126)
Anion gap: 8 (ref 5–15)
BUN: 13 mg/dL (ref 8–23)
CO2: 27 mmol/L (ref 22–32)
Calcium: 9.7 mg/dL (ref 8.9–10.3)
Chloride: 103 mmol/L (ref 98–111)
Creatinine, Ser: 0.64 mg/dL (ref 0.61–1.24)
GFR, Estimated: >60 mL/min (ref >60)
Glucose, Bld: 113 mg/dL — ABNORMAL HIGH (ref 70–99)
Potassium: 3.8 mmol/L (ref 3.5–5.1)
Sodium: 138 mmol/L (ref 135–145)
Total Bilirubin: 0.6 mg/dL (ref <1.2)
Total Protein: 6.6 g/dL (ref 6.5–8.1)

## 2022-12-03 LAB — MAGNESIUM: Magnesium: 1.2 mg/dL — ABNORMAL LOW (ref 1.7–2.4)

## 2022-12-03 MED ORDER — SODIUM CHLORIDE 0.9 % IV SOLN: Freq: Once | INTRAVENOUS | Status: AC

## 2022-12-03 MED ORDER — FAMOTIDINE IN NACL 20-0.9 MG/50ML-% IV SOLN
20.0000 mg | Freq: Once | INTRAVENOUS | Status: AC
  Administered 2022-12-03: 20 mg via INTRAVENOUS
  Filled 2022-12-03: qty 50

## 2022-12-03 MED ORDER — CETUXIMAB CHEMO IV INJECTION 200 MG/100ML
250.0000 mg/m2 | Freq: Once | INTRAVENOUS | Status: AC
  Administered 2022-12-03: 500 mg via INTRAVENOUS
  Filled 2022-12-03: qty 200

## 2022-12-03 MED ORDER — METHYLPREDNISOLONE SODIUM SUCC 125 MG IJ SOLR
125.0000 mg | Freq: Once | INTRAMUSCULAR | Status: AC
  Administered 2022-12-03: 125 mg via INTRAVENOUS
  Filled 2022-12-03: qty 2

## 2022-12-03 MED ORDER — DIPHENHYDRAMINE HCL 50 MG/ML IJ SOLN
50.0000 mg | Freq: Once | INTRAMUSCULAR | Status: AC
  Administered 2022-12-03: 50 mg via INTRAVENOUS
  Filled 2022-12-03: qty 1

## 2022-12-03 MED ORDER — HEPARIN SOD (PORK) LOCK FLUSH 100 UNIT/ML IV SOLN
500.0000 [IU] | Freq: Once | INTRAVENOUS | Status: AC | PRN
  Administered 2022-12-03: 500 [IU]

## 2022-12-03 MED ORDER — SODIUM CHLORIDE 0.9% FLUSH
10.0000 mL | INTRAVENOUS | Status: DC | PRN
  Administered 2022-12-03: 10 mL

## 2022-12-03 MED ORDER — MAGNESIUM SULFATE 4 GM/100ML IV SOLN
4.0000 g | Freq: Once | INTRAVENOUS | Status: AC
  Administered 2022-12-03: 4 g via INTRAVENOUS
  Filled 2022-12-03: qty 100

## 2022-12-03 MED ORDER — HYDROCODONE-ACETAMINOPHEN 7.5-325 MG/15ML PO SOLN: 15.0000 mL | Freq: Four times a day (QID) | ORAL | 0 refills | Status: DC | PRN

## 2022-12-03 NOTE — Patient Instructions (Signed)
 [facility]  Discharge Instructions: Thank you for choosing [facility] to provide your oncology and hematology care.  If you have a lab appointment with the Cancer Center - please note that after April 8th, 2024, all labs will be drawn in the cancer center.  You do not have to check in or register with the main entrance as you have in the past but will complete your check-in in the cancer center.  Wear comfortable clothing and clothing appropriate for easy access to any Portacath or PICC line.   We strive to give you quality time with your provider. You may need to reschedule your appointment if you arrive late (15 or more minutes).  Arriving late affects you and other patients whose appointments are after yours.  Also, if you miss three or more appointments without notifying the office, you may be dismissed from the clinic at the provider's discretion.      For prescription refill requests, have your pharmacy contact our office and allow 72 hours for refills to be completed.    Today you received the following chemotherapy and/or immunotherapy agents Erbitux.  Cetuximab Injection What is this medication? CETUXIMAB (se TUX i mab) treats head and neck cancer. It may also be used to treat colorectal cancer. It works by blocking a protein that causes cancer cells to grow and multiply. This helps to slow or stop the spread of cancer cells. It is a monoclonal antibody. This medicine may be used for other purposes; ask your health care provider or pharmacist if you have questions. COMMON BRAND NAME(S): Erbitux What should I tell my care team before I take this medication? They need to know if you have any of these conditions: Heart disease History of tick bites Low levels of calcium, magnesium, or potassium in the blood Lung disease Red meat allergy An unusual or allergic reaction to cetuximab, other medications, foods, dyes,  or preservatives Pregnant or trying to get pregnant Breast-feeding How should I use this medication? This medication is infused into a vein. It is given by your care team in a hospital or clinic setting. Talk to your care team about the use of this medication in children. Special care may be needed. Overdosage: If you think you have taken too much of this medicine contact a poison control center or emergency room at once. NOTE: This medicine is only for you. Do not share this medicine with others. What if I miss a dose? Keep appointments for follow-up doses. It is important not to miss your dose. Call your care team if you are unable to keep an appointment. What may interact with this medication? Interactions are not expected. This list may not describe all possible interactions. Give your health care provider a list of all the medicines, herbs, non-prescription drugs, or dietary supplements you use. Also tell them if you smoke, drink alcohol, or use illegal drugs. Some items may interact with your medicine. What should I watch for while using this medication? Visit your care team for regular checks on your progress. This medication may make you feel generally unwell. This is not uncommon, as chemotherapy can affect healthy cells as well as cancer cells. Report any side effects. Continue your course of treatment even though you feel ill unless your care team tells you to stop. You may need blood work done while you are taking this medication. This medication can make you more sensitive to  the sun. Keep out of the sun while taking this medication and for 2 months after the last dose. If you cannot avoid being in the sun, wear protective clothing and sunscreen. Do not use sun lamps, tanning beds, or tanning booths. This medication can cause serious infusion reactions. To reduce the risk, your care team may give you other medications to take before receiving this one. Be sure to follow the directions of  your care team. This medication may cause serious skin reactions. They can happen weeks to months after starting the medication. Contact your care team right away if you notice fevers or flu-like symptoms with a rash. The rash may be red or purple and then turn into blisters or peeling of the skin. You may also notice a red rash with swelling of the face, lips, or lymph nodes in your neck or under your arms. Talk to your care team if you may be pregnant. Serious birth defects can occur if you take this medication during pregnancy and for 2 months after the last dose. You will need a negative pregnancy test before starting this medication. Contraception is recommended while taking this medication and for 2 months after the last dose. Your care team can help you find the option that works for you. Do not breastfeed while taking this medication and for 2 months after the last dose. This medication may cause infertility. Talk to your care team if you are concerned about your fertility. What side effects may I notice from receiving this medication? Side effects that you should report to your care team as soon as possible: Allergic reactions--skin rash, itching, hives, swelling of the face, lips, tongue, or throat Dry cough, shortness of breath or trouble breathing Heart attack--pain or tightness in the chest, shoulders, arms, or jaw, nausea, shortness of breath, cold or clammy skin, feeling faint or lightheaded Infusion reactions--chest pain, shortness of breath or trouble breathing, feeling faint or lightheaded Low calcium level--muscle pain or cramps, confusion, tingling, or numbness in the hands or feet Low magnesium level--muscle pain or cramps, unusual weakness or fatigue, fast or irregular heartbeat, tremors Low potassium level--muscle pain or cramps, unusual weakness or fatigue, fast or irregular heartbeat, constipation Redness, blistering, peeling, or loosening of the skin, including inside the  mouth Side effects that usually do not require medical attention (report to your care team if they continue or are bothersome): Diarrhea Headache Joint pain Nausea Unusual weakness or fatigue Weight loss This list may not describe all possible side effects. Call your doctor for medical advice about side effects. You may report side effects to FDA at 1-800-FDA-1088. Where should I keep my medication? This medication is given in a hospital or clinic. It will not be stored at home. NOTE: This sheet is a summary. It may not cover all possible information. If you have questions about this medicine, talk to your doctor, pharmacist, or health care provider.  2024 Elsevier/Gold Standard (2021-05-16 00:00:00)        To help prevent nausea and vomiting after your treatment, we encourage you to take your nausea medication as directed.  BELOW ARE SYMPTOMS THAT SHOULD BE REPORTED IMMEDIATELY: *FEVER GREATER THAN 100.4 F (38 C) OR HIGHER *CHILLS OR SWEATING *NAUSEA AND VOMITING THAT IS NOT CONTROLLED WITH YOUR NAUSEA MEDICATION *UNUSUAL SHORTNESS OF BREATH *UNUSUAL BRUISING OR BLEEDING *URINARY PROBLEMS (pain or burning when urinating, or frequent urination) *BOWEL PROBLEMS (unusual diarrhea, constipation, pain near the anus) TENDERNESS IN MOUTH AND THROAT WITH OR WITHOUT PRESENCE  OF ULCERS (sore throat, sores in mouth, or a toothache) UNUSUAL RASH, SWELLING OR PAIN  UNUSUAL VAGINAL DISCHARGE OR ITCHING   Items with * indicate a potential emergency and should be followed up as soon as possible or go to the Emergency Department if any problems should occur.  Please show the CHEMOTHERAPY ALERT CARD or IMMUNOTHERAPY ALERT CARD at check-in to the Emergency Department and triage nurse.  Should you have questions after your visit or need to cancel or reschedule your appointment, please contact [facility] - A DEPT OF [facility] Bridegroom [facility] [phone]  and follow the prompts.   Office hours are 8:00 a.m. to 4:30 p.m. Monday - Friday. Please note that voicemails left after 4:00 p.m. may not be returned until the following business day.  We are closed weekends and major holidays. You have access to a nurse at all times for urgent questions. Please call the main number to the clinic [phone] and follow the prompts.  For any non-urgent questions, you may also contact your provider using MyChart. We now offer e-Visits for anyone 67 and older to request care online for non-urgent symptoms. For details visit [email or web address].   Also download the MyChart app! Go to the app store, search "MyChart", open the app, select [place], and log in with your MyChart username and password.

## 2022-12-03 NOTE — Progress Notes (Signed)
Patient presents today for chemotherapy infusion.  Patient is in satisfactory condition with no new complaints voiced.  Vital signs are stable.  Labs reviewed and all labs are within treatment parameters.   Magnesium today is 1.2.  We will give magnesium sulfate 4 grams IV x one dose today per standing orders by Dr. Ellin Saba.   We will proceed with treatment per MD orders.    Patient tolerated treatment well with no complaints voiced.  Patient left via wheelchair with sister, Cline Cools, in stable condition.  Vital signs stable at discharge.  Follow up as scheduled.

## 2022-12-09 ENCOUNTER — Inpatient Hospital Stay: Payer: 59

## 2022-12-09 VITALS — BP 121/52 | HR 67 | Temp 97.6°F | Resp 18 | Wt 159.8 lb

## 2022-12-09 DIAGNOSIS — R21 Rash and other nonspecific skin eruption: Secondary | ICD-10-CM | POA: Diagnosis not present

## 2022-12-09 DIAGNOSIS — C049 Malignant neoplasm of floor of mouth, unspecified: Secondary | ICD-10-CM

## 2022-12-09 DIAGNOSIS — Z5111 Encounter for antineoplastic chemotherapy: Secondary | ICD-10-CM | POA: Diagnosis not present

## 2022-12-09 DIAGNOSIS — I959 Hypotension, unspecified: Secondary | ICD-10-CM | POA: Diagnosis not present

## 2022-12-09 DIAGNOSIS — C787 Secondary malignant neoplasm of liver and intrahepatic bile duct: Secondary | ICD-10-CM | POA: Diagnosis not present

## 2022-12-09 DIAGNOSIS — Z87891 Personal history of nicotine dependence: Secondary | ICD-10-CM | POA: Diagnosis not present

## 2022-12-09 LAB — CBC WITH DIFFERENTIAL/PLATELET
Abs Immature Granulocytes: 0.02 10*3/uL (ref 0.00–0.07)
Basophils Absolute: 0.1 10*3/uL (ref 0.0–0.1)
Basophils Relative: 1 %
Eosinophils Absolute: 0.5 10*3/uL (ref 0.0–0.5)
Eosinophils Relative: 6 %
HCT: 36.6 % — ABNORMAL LOW (ref 39.0–52.0)
Hemoglobin: 11.8 g/dL — ABNORMAL LOW (ref 13.0–17.0)
Immature Granulocytes: 0 %
Lymphocytes Relative: 14 %
Lymphs Abs: 1.1 10*3/uL (ref 0.7–4.0)
MCH: 33.2 pg (ref 26.0–34.0)
MCHC: 32.2 g/dL (ref 30.0–36.0)
MCV: 103.1 fL — ABNORMAL HIGH (ref 80.0–100.0)
Monocytes Absolute: 0.8 10*3/uL (ref 0.1–1.0)
Monocytes Relative: 11 %
Neutro Abs: 5.3 10*3/uL (ref 1.7–7.7)
Neutrophils Relative %: 68 %
Platelets: 253 10*3/uL (ref 150–400)
RBC: 3.55 MIL/uL — ABNORMAL LOW (ref 4.22–5.81)
RDW: 14.3 % (ref 11.5–15.5)
WBC: 7.8 10*3/uL (ref 4.0–10.5)
nRBC: 0 % (ref 0.0–0.2)

## 2022-12-09 LAB — COMPREHENSIVE METABOLIC PANEL
ALT: 17 U/L (ref 0–44)
AST: 19 U/L (ref 15–41)
Albumin: 3 g/dL — ABNORMAL LOW (ref 3.5–5.0)
Alkaline Phosphatase: 43 U/L (ref 38–126)
Anion gap: 7 (ref 5–15)
BUN: 9 mg/dL (ref 8–23)
CO2: 25 mmol/L (ref 22–32)
Calcium: 9.3 mg/dL (ref 8.9–10.3)
Chloride: 104 mmol/L (ref 98–111)
Creatinine, Ser: 0.45 mg/dL — ABNORMAL LOW (ref 0.61–1.24)
GFR, Estimated: >60 mL/min (ref >60)
Glucose, Bld: 105 mg/dL — ABNORMAL HIGH (ref 70–99)
Potassium: 4.1 mmol/L (ref 3.5–5.1)
Sodium: 136 mmol/L (ref 135–145)
Total Bilirubin: 0.8 mg/dL (ref <1.2)
Total Protein: 5.7 g/dL — ABNORMAL LOW (ref 6.5–8.1)

## 2022-12-09 LAB — MAGNESIUM: Magnesium: 1 mg/dL — ABNORMAL LOW (ref 1.7–2.4)

## 2022-12-09 MED ORDER — METHYLPREDNISOLONE SODIUM SUCC 125 MG IJ SOLR
125.0000 mg | Freq: Once | INTRAMUSCULAR | Status: AC
  Administered 2022-12-09: 125 mg via INTRAVENOUS
  Filled 2022-12-09: qty 2

## 2022-12-09 MED ORDER — SODIUM CHLORIDE 0.9 % IV SOLN: Freq: Once | INTRAVENOUS | Status: AC

## 2022-12-09 MED ORDER — CETUXIMAB CHEMO IV INJECTION 200 MG/100ML
250.0000 mg/m2 | Freq: Once | INTRAVENOUS | Status: AC
Start: 2022-12-09 — End: 2022-12-09
  Administered 2022-12-09: 500 mg via INTRAVENOUS
  Filled 2022-12-09: qty 50

## 2022-12-09 MED ORDER — DIPHENHYDRAMINE HCL 50 MG/ML IJ SOLN
50.0000 mg | Freq: Once | INTRAMUSCULAR | Status: AC
  Administered 2022-12-09: 50 mg via INTRAVENOUS
  Filled 2022-12-09: qty 1

## 2022-12-09 MED ORDER — MAGNESIUM SULFATE 4 GM/100ML IV SOLN
4.0000 g | Freq: Once | INTRAVENOUS | Status: AC
  Administered 2022-12-09: 4 g via INTRAVENOUS
  Filled 2022-12-09: qty 100

## 2022-12-09 MED ORDER — HEPARIN SOD (PORK) LOCK FLUSH 100 UNIT/ML IV SOLN
500.0000 [IU] | Freq: Once | INTRAVENOUS | Status: AC | PRN
  Administered 2022-12-09: 500 [IU]

## 2022-12-09 MED ORDER — FAMOTIDINE IN NACL 20-0.9 MG/50ML-% IV SOLN
20.0000 mg | Freq: Once | INTRAVENOUS | Status: AC
  Administered 2022-12-09: 20 mg via INTRAVENOUS
  Filled 2022-12-09: qty 50

## 2022-12-09 NOTE — Patient Instructions (Signed)
[place] [facility]  Discharge Instructions: Thank you for choosing [facility] to provide your oncology and hematology care.  If you have a lab appointment with the Cancer Center - please note that after April 8th, 2024, all labs will be drawn in the cancer center.  You do not have to check in or register with the main entrance as you have in the past but will complete your check-in in the cancer center.  Wear comfortable clothing and clothing appropriate for easy access to any Portacath or PICC line.   We strive to give you quality time with your provider. You may need to reschedule your appointment if you arrive late (15 or more minutes).  Arriving late affects you and other patients whose appointments are after yours.  Also, if you miss three or more appointments without notifying the office, you may be dismissed from the clinic at the provider's discretion.      For prescription refill requests, have your pharmacy contact our office and allow 72 hours for refills to be completed.    Today you received the following chemotherapy and/or immunotherapy agents Erbitux   To help prevent nausea and vomiting after your treatment, we encourage you to take your nausea medication as directed.  BELOW ARE SYMPTOMS THAT SHOULD BE REPORTED IMMEDIATELY: *FEVER GREATER THAN 100.4 F (38 C) OR HIGHER *CHILLS OR SWEATING *NAUSEA AND VOMITING THAT IS NOT CONTROLLED WITH YOUR NAUSEA MEDICATION *UNUSUAL SHORTNESS OF BREATH *UNUSUAL BRUISING OR BLEEDING *URINARY PROBLEMS (pain or burning when urinating, or frequent urination) *BOWEL PROBLEMS (unusual diarrhea, constipation, pain near the anus) TENDERNESS IN MOUTH AND THROAT WITH OR WITHOUT PRESENCE OF ULCERS (sore throat, sores in mouth, or a toothache) UNUSUAL RASH, SWELLING OR PAIN  UNUSUAL VAGINAL DISCHARGE OR ITCHING   Items with * indicate a potential emergency and should be followed up as  soon as possible or go to the Emergency Department if any problems should occur.  Please show the CHEMOTHERAPY ALERT CARD or IMMUNOTHERAPY ALERT CARD at check-in to the Emergency Department and triage nurse.  Should you have questions after your visit or need to cancel or reschedule your appointment, please contact [facility] - A DEPT OF [facility] [phone]  and follow the prompts.  Office hours are 8:00 a.m. to 4:30 p.m. Monday - Friday. Please note that voicemails left after 4:00 p.m. may not be returned until the following business day.  We are closed weekends and major holidays. You have access to a nurse at all times for urgent questions. Please call the main number to the clinic [phone] and follow the prompts.  For any non-urgent questions, you may also contact your provider using MyChart. We now offer e-Visits for anyone 29 and older to request care online for non-urgent symptoms. For details visit [email or web address].   Also download the MyChart app! Go to the app store, search "MyChart", open the app, select , and log in with your MyChart username and password.

## 2022-12-09 NOTE — Progress Notes (Signed)
Patient's Mag 1.0, per standing orders pt to receive Mag 4g IV. Pharmacy placed orders. Patient tolerated chemotherapy with no complaints voiced.  Side effects with management reviewed with understanding verbalized.  Port site clean and dry with no bruising or swelling noted at site.  Good blood return noted before and after administration of chemotherapy.  Band aid applied.  Patient left in satisfactory condition with VSS and no s/s of distress noted. All follow ups as scheduled.   Nathaniel Hicks [employer]

## 2022-12-17 ENCOUNTER — Inpatient Hospital Stay: Payer: 59 | Attending: Hematology

## 2022-12-17 ENCOUNTER — Inpatient Hospital Stay: Payer: 59 | Admitting: Hematology

## 2022-12-17 ENCOUNTER — Inpatient Hospital Stay: Payer: 59

## 2022-12-17 ENCOUNTER — Other Ambulatory Visit: Payer: Self-pay | Admitting: *Deleted

## 2022-12-17 DIAGNOSIS — Z5111 Encounter for antineoplastic chemotherapy: Secondary | ICD-10-CM | POA: Insufficient documentation

## 2022-12-17 DIAGNOSIS — C787 Secondary malignant neoplasm of liver and intrahepatic bile duct: Secondary | ICD-10-CM | POA: Diagnosis not present

## 2022-12-17 DIAGNOSIS — C049 Malignant neoplasm of floor of mouth, unspecified: Secondary | ICD-10-CM

## 2022-12-17 DIAGNOSIS — R42 Dizziness and giddiness: Secondary | ICD-10-CM

## 2022-12-17 LAB — CBC WITH DIFFERENTIAL/PLATELET
Abs Immature Granulocytes: 0.03 10*3/uL (ref 0.00–0.07)
Basophils Absolute: 0.1 10*3/uL (ref 0.0–0.1)
Basophils Relative: 1 %
Eosinophils Absolute: 0.3 10*3/uL (ref 0.0–0.5)
Eosinophils Relative: 5 %
HCT: 37.2 % — ABNORMAL LOW (ref 39.0–52.0)
Hemoglobin: 11.5 g/dL — ABNORMAL LOW (ref 13.0–17.0)
Immature Granulocytes: 0 %
Lymphocytes Relative: 15 %
Lymphs Abs: 1 10*3/uL (ref 0.7–4.0)
MCH: 32.1 pg (ref 26.0–34.0)
MCHC: 30.9 g/dL (ref 30.0–36.0)
MCV: 103.9 fL — ABNORMAL HIGH (ref 80.0–100.0)
Monocytes Absolute: 0.8 10*3/uL (ref 0.1–1.0)
Monocytes Relative: 11 %
Neutro Abs: 4.7 10*3/uL (ref 1.7–7.7)
Neutrophils Relative %: 68 %
Platelets: 250 10*3/uL (ref 150–400)
RBC: 3.58 MIL/uL — ABNORMAL LOW (ref 4.22–5.81)
RDW: 14 % (ref 11.5–15.5)
WBC: 6.9 10*3/uL (ref 4.0–10.5)
nRBC: 0 % (ref 0.0–0.2)

## 2022-12-17 LAB — COMPREHENSIVE METABOLIC PANEL
ALT: 15 U/L (ref 0–44)
AST: 17 U/L (ref 15–41)
Albumin: 3.2 g/dL — ABNORMAL LOW (ref 3.5–5.0)
Alkaline Phosphatase: 46 U/L (ref 38–126)
Anion gap: 6 (ref 5–15)
BUN: 14 mg/dL (ref 8–23)
CO2: 26 mmol/L (ref 22–32)
Calcium: 9.4 mg/dL (ref 8.9–10.3)
Chloride: 104 mmol/L (ref 98–111)
Creatinine, Ser: 0.75 mg/dL (ref 0.61–1.24)
GFR, Estimated: >60 mL/min (ref >60)
Glucose, Bld: 117 mg/dL — ABNORMAL HIGH (ref 70–99)
Potassium: 4.8 mmol/L (ref 3.5–5.1)
Sodium: 136 mmol/L (ref 135–145)
Total Bilirubin: 0.5 mg/dL (ref <1.2)
Total Protein: 6.5 g/dL (ref 6.5–8.1)

## 2022-12-17 LAB — MAGNESIUM: Magnesium: 1.1 mg/dL — ABNORMAL LOW (ref 1.7–2.4)

## 2022-12-17 MED ORDER — SODIUM CHLORIDE 0.9% FLUSH
10.0000 mL | Freq: Once | INTRAVENOUS | Status: AC
  Administered 2022-12-17: 10 mL via INTRAVENOUS

## 2022-12-17 MED ORDER — CETUXIMAB CHEMO IV INJECTION 200 MG/100ML
250.0000 mg/m2 | Freq: Once | INTRAVENOUS | Status: AC
  Administered 2022-12-17: 500 mg via INTRAVENOUS
  Filled 2022-12-17: qty 200

## 2022-12-17 MED ORDER — METHYLPREDNISOLONE SODIUM SUCC 125 MG IJ SOLR
125.0000 mg | Freq: Once | INTRAMUSCULAR | Status: AC
  Administered 2022-12-17: 125 mg via INTRAVENOUS
  Filled 2022-12-17: qty 2

## 2022-12-17 MED ORDER — DIPHENHYDRAMINE HCL 50 MG/ML IJ SOLN
25.0000 mg | Freq: Once | INTRAMUSCULAR | Status: AC
  Administered 2022-12-17: 25 mg via INTRAVENOUS
  Filled 2022-12-17: qty 1

## 2022-12-17 MED ORDER — SODIUM CHLORIDE 0.9% FLUSH
10.0000 mL | INTRAVENOUS | Status: DC | PRN
  Administered 2022-12-17: 10 mL

## 2022-12-17 MED ORDER — MAGNESIUM SULFATE 4 GM/100ML IV SOLN
4.0000 g | Freq: Once | INTRAVENOUS | Status: AC
Start: 2022-12-17 — End: 2022-12-17
  Administered 2022-12-17: 4 g via INTRAVENOUS
  Filled 2022-12-17: qty 100

## 2022-12-17 MED ORDER — FAMOTIDINE IN NACL 20-0.9 MG/50ML-% IV SOLN
20.0000 mg | Freq: Once | INTRAVENOUS | Status: AC
  Administered 2022-12-17: 20 mg via INTRAVENOUS
  Filled 2022-12-17: qty 50

## 2022-12-17 MED ORDER — HEPARIN SOD (PORK) LOCK FLUSH 100 UNIT/ML IV SOLN
500.0000 [IU] | Freq: Once | INTRAVENOUS | Status: AC | PRN
  Administered 2022-12-17: 500 [IU]

## 2022-12-17 MED ORDER — SODIUM CHLORIDE 0.9 % IV SOLN: Freq: Once | INTRAVENOUS | Status: AC

## 2022-12-17 NOTE — Patient Instructions (Signed)
[facility]  Discharge Instructions: Thank you for choosing [facility] to provide your oncology and hematology care.  If you have a lab appointment with the Cancer Center - please note that after April 8th, 2024, all labs will be drawn in the cancer center.  You do not have to check in or register with the main entrance as you have in the past but will complete your check-in in the cancer center.  Wear comfortable clothing and clothing appropriate for easy access to any Portacath or PICC line.   We strive to give you quality time with your provider. You may need to reschedule your appointment if you arrive late (15 or more minutes).  Arriving late affects you and other patients whose appointments are after yours.  Also, if you miss three or more appointments without notifying the office, you may be dismissed from the clinic at the provider's discretion.      For prescription refill requests, have your pharmacy contact our office and allow 72 hours for refills to be completed.    Today you received the following chemotherapy and/or immunotherapy agents Erbitux, and 4g of Mag, return as scheduled.   To help prevent nausea and vomiting after your treatment, we encourage you to take your nausea medication as directed.  BELOW ARE SYMPTOMS THAT SHOULD BE REPORTED IMMEDIATELY: *FEVER GREATER THAN 100.4 F (38 C) OR HIGHER *CHILLS OR SWEATING *NAUSEA AND VOMITING THAT IS NOT CONTROLLED WITH YOUR NAUSEA MEDICATION *UNUSUAL SHORTNESS OF BREATH *UNUSUAL BRUISING OR BLEEDING *URINARY PROBLEMS (pain or burning when urinating, or frequent urination) *BOWEL PROBLEMS (unusual diarrhea, constipation, pain near the anus) TENDERNESS IN MOUTH AND THROAT WITH OR WITHOUT PRESENCE OF ULCERS (sore throat, sores in mouth, or a toothache) UNUSUAL RASH, SWELLING OR PAIN  UNUSUAL VAGINAL DISCHARGE OR ITCHING   Items with * indicate a potential  emergency and should be followed up as soon as possible or go to the Emergency Department if any problems should occur.  Please show the CHEMOTHERAPY ALERT CARD or IMMUNOTHERAPY ALERT CARD at check-in to the Emergency Department and triage nurse.  Should you have questions after your visit or need to cancel or reschedule your appointment, please contact [facility]  - A DEPT OF [facility] [phone]  and follow the prompts.  Office hours are 8:00 a.m. to 4:30 p.m. Monday - Friday. Please note that voicemails left after 4:00 p.m. may not be returned until the following business day.  We are closed weekends and major holidays. You have access to a nurse at all times for urgent questions. Please call the main number to the clinic [phone] and follow the prompts.  For any non-urgent questions, you may also contact your provider using MyChart. We now offer e-Visits for anyone 69 and older to request care online for non-urgent symptoms. For details visit [email or web address].   Also download the MyChart app! Go to the app store, search "MyChart", open the app, select [place], and log in with your MyChart username and password.

## 2022-12-17 NOTE — Progress Notes (Signed)
Decrease diphenhydramine to 25 mg IVPush prior to Erbitux today.  T.O. Dr Carilyn Goodpasture, PharmD

## 2022-12-17 NOTE — Progress Notes (Signed)
Patient presents today for Erbitux, Magnesium 1.1 patient states he is taking Magnesium 3x a day at home, patient states he has pounding in head, and an occasional headache, weight loss of 6lbs since last week. Dr. Ellin Saba made aware, patient okay for treatment, MRI scheduled for patient prior to next office visit and patient made aware. 4g of Magnesium given per standing orders. Patient tolerated chemotherapy with no complaints voiced. Side effects with management reviewed understanding verbalized. Port site clean and dry with no bruising or swelling noted at site. Good blood return noted before and after administration of chemotherapy. Band aid applied. Patient left in satisfactory condition with VSS and no s/s of distress noted.

## 2022-12-24 ENCOUNTER — Inpatient Hospital Stay: Payer: 59

## 2022-12-24 ENCOUNTER — Encounter (HOSPITAL_COMMUNITY)
Admission: RE | Admit: 2022-12-24 | Discharge: 2022-12-24 | Disposition: A | Discharge status: Home / Self Care | Payer: 59 | Source: Ambulatory Visit | Attending: Hematology | Admitting: Hematology

## 2022-12-24 VITALS — BP 136/63 | HR 75 | Temp 97.7°F | Resp 18 | Wt 155.1 lb

## 2022-12-24 DIAGNOSIS — C787 Secondary malignant neoplasm of liver and intrahepatic bile duct: Secondary | ICD-10-CM | POA: Diagnosis not present

## 2022-12-24 DIAGNOSIS — C049 Malignant neoplasm of floor of mouth, unspecified: Secondary | ICD-10-CM

## 2022-12-24 DIAGNOSIS — Z5111 Encounter for antineoplastic chemotherapy: Secondary | ICD-10-CM | POA: Diagnosis not present

## 2022-12-24 LAB — CBC WITH DIFFERENTIAL/PLATELET
Abs Immature Granulocytes: 0.02 10*3/uL (ref 0.00–0.07)
Basophils Absolute: 0.1 10*3/uL (ref 0.0–0.1)
Basophils Relative: 1 %
Eosinophils Absolute: 0.3 10*3/uL (ref 0.0–0.5)
Eosinophils Relative: 5 %
HCT: 37.8 % — ABNORMAL LOW (ref 39.0–52.0)
Hemoglobin: 12.3 g/dL — ABNORMAL LOW (ref 13.0–17.0)
Immature Granulocytes: 0 %
Lymphocytes Relative: 16 %
Lymphs Abs: 1.1 10*3/uL (ref 0.7–4.0)
MCH: 33.2 pg (ref 26.0–34.0)
MCHC: 32.5 g/dL (ref 30.0–36.0)
MCV: 102.2 fL — ABNORMAL HIGH (ref 80.0–100.0)
Monocytes Absolute: 0.7 10*3/uL (ref 0.1–1.0)
Monocytes Relative: 11 %
Neutro Abs: 4.3 10*3/uL (ref 1.7–7.7)
Neutrophils Relative %: 67 %
Platelets: 255 10*3/uL (ref 150–400)
RBC: 3.7 MIL/uL — ABNORMAL LOW (ref 4.22–5.81)
RDW: 14 % (ref 11.5–15.5)
WBC: 6.5 10*3/uL (ref 4.0–10.5)
nRBC: 0 % (ref 0.0–0.2)

## 2022-12-24 LAB — COMPREHENSIVE METABOLIC PANEL WITH GFR
ALT: 15 U/L (ref 0–44)
AST: 18 U/L (ref 15–41)
Albumin: 3.4 g/dL — ABNORMAL LOW (ref 3.5–5.0)
Alkaline Phosphatase: 47 U/L (ref 38–126)
Anion gap: 7 (ref 5–15)
BUN: 13 mg/dL (ref 8–23)
CO2: 25 mmol/L (ref 22–32)
Calcium: 9.7 mg/dL (ref 8.9–10.3)
Chloride: 104 mmol/L (ref 98–111)
Creatinine, Ser: 0.78 mg/dL (ref 0.61–1.24)
GFR, Estimated: >60 mL/min
Glucose, Bld: 104 mg/dL — ABNORMAL HIGH (ref 70–99)
Potassium: 4.2 mmol/L (ref 3.5–5.1)
Sodium: 136 mmol/L (ref 135–145)
Total Bilirubin: 0.6 mg/dL
Total Protein: 6.7 g/dL (ref 6.5–8.1)

## 2022-12-24 LAB — MAGNESIUM: Magnesium: 1.2 mg/dL — ABNORMAL LOW (ref 1.7–2.4)

## 2022-12-24 MED ORDER — HEPARIN SOD (PORK) LOCK FLUSH 100 UNIT/ML IV SOLN
500.0000 [IU] | Freq: Once | INTRAVENOUS | Status: AC | PRN
  Administered 2022-12-24: 500 [IU]

## 2022-12-24 MED ORDER — SODIUM CHLORIDE 0.9% FLUSH
10.0000 mL | INTRAVENOUS | Status: DC | PRN
Start: 2022-12-24 — End: 2022-12-24
  Administered 2022-12-24: 10 mL

## 2022-12-24 MED ORDER — SODIUM CHLORIDE 0.9% FLUSH
10.0000 mL | Freq: Once | INTRAVENOUS | Status: AC
  Administered 2022-12-24: 10 mL via INTRAVENOUS

## 2022-12-24 MED ORDER — METHYLPREDNISOLONE SODIUM SUCC 125 MG IJ SOLR
125.0000 mg | Freq: Once | INTRAMUSCULAR | Status: AC
  Administered 2022-12-24: 125 mg via INTRAVENOUS
  Filled 2022-12-24: qty 2

## 2022-12-24 MED ORDER — DIPHENHYDRAMINE HCL 50 MG/ML IJ SOLN
50.0000 mg | Freq: Once | INTRAMUSCULAR | Status: AC
  Administered 2022-12-24: 50 mg via INTRAVENOUS
  Filled 2022-12-24: qty 1

## 2022-12-24 MED ORDER — SODIUM CHLORIDE 0.9 % IV SOLN: Freq: Once | INTRAVENOUS | Status: AC

## 2022-12-24 MED ORDER — FLUDEOXYGLUCOSE F - 18 (FDG) INJECTION
7.7700 | Freq: Once | INTRAVENOUS | Status: AC | PRN
  Administered 2022-12-24: 7.77 via INTRAVENOUS

## 2022-12-24 MED ORDER — MAGNESIUM SULFATE 4 GM/100ML IV SOLN
4.0000 g | Freq: Once | INTRAVENOUS | Status: AC
  Administered 2022-12-24: 4 g via INTRAVENOUS
  Filled 2022-12-24: qty 100

## 2022-12-24 MED ORDER — EMPTY CONTAINERS FLEXIBLE MISC
250.0000 mg/m2 | Freq: Once | Status: AC
  Administered 2022-12-24: 500 mg via INTRAVENOUS
  Filled 2022-12-24: qty 50

## 2022-12-24 MED ORDER — FAMOTIDINE IN NACL 20-0.9 MG/50ML-% IV SOLN
20.0000 mg | Freq: Once | INTRAVENOUS | Status: AC
  Administered 2022-12-24: 20 mg via INTRAVENOUS
  Filled 2022-12-24: qty 50

## 2022-12-24 NOTE — Progress Notes (Signed)
Patient presents today for Erbitux infusion. Patient is in satisfactory condition with no new complaints voiced.  Vital signs are stable.  Labs reviewed and all labs are within treatment parameters. Patient will also receive 4g IV magnesium sulfate per Dr.K's standing orders. Okay to run 4g IV magnesium over 1 hour per Dr.K. We will proceed with treatment per MD orders.    Treatment given today per MD orders. Tolerated infusion without adverse affects. Vital signs stable. No complaints at this time. Discharged from clinic via wheelchair in stable condition. Alert and oriented x 3. F/U with [facility] as scheduled.

## 2022-12-24 NOTE — Patient Instructions (Signed)
[facility] - A DEPT OF [facility]  Discharge Instructions: Thank you for choosing [facility] to provide your oncology and hematology care.  If you have a lab appointment with the Cancer Center - please note that after April 8th, 2024, all labs will be drawn in the cancer center.  You do not have to check in or register with the main entrance as you have in the past but will complete your check-in in the cancer center.  Wear comfortable clothing and clothing appropriate for easy access to any Portacath or PICC line.   We strive to give you quality time with your provider. You may need to reschedule your appointment if you arrive late (15 or more minutes).  Arriving late affects you and other patients whose appointments are after yours.  Also, if you miss three or more appointments without notifying the office, you may be dismissed from the clinic at the provider's discretion.      For prescription refill requests, have your pharmacy contact our office and allow 72 hours for refills to be completed.    Today you received the following chemotherapy and/or immunotherapy agents Erbitux   To help prevent nausea and vomiting after your treatment, we encourage you to take your nausea medication as directed.  Cetuximab Injection What is this medication? CETUXIMAB (se TUX i mab) treats head and neck cancer. It may also be used to treat colorectal cancer. It works by blocking a protein that causes cancer cells to grow and multiply. This helps to slow or stop the spread of cancer cells. It is a monoclonal antibody. This medicine may be used for other purposes; ask your health care provider or pharmacist if you have questions. COMMON BRAND NAME(S): Erbitux What should I tell my care team before I take this medication? They need to know if you have any of these conditions: Heart disease History of tick bites Low levels of calcium, magnesium, or potassium in  the blood Lung disease Red meat allergy An unusual or allergic reaction to cetuximab, other medications, foods, dyes, or preservatives Pregnant or trying to get pregnant Breast-feeding How should I use this medication? This medication is infused into a vein. It is given by your care team in a hospital or clinic setting. Talk to your care team about the use of this medication in children. Special care may be needed. Overdosage: If you think you have taken too much of this medicine contact a poison control center or emergency room at once. NOTE: This medicine is only for you. Do not share this medicine with others. What if I miss a dose? Keep appointments for follow-up doses. It is important not to miss your dose. Call your care team if you are unable to keep an appointment. What may interact with this medication? Interactions are not expected. This list may not describe all possible interactions. Give your health care provider a list of all the medicines, herbs, non-prescription drugs, or dietary supplements you use. Also tell them if you smoke, drink alcohol, or use illegal drugs. Some items may interact with your medicine. What should I watch for while using this medication? Visit your care team for regular checks on your progress. This medication may make you feel generally unwell. This is not uncommon, as chemotherapy can affect healthy cells as well as cancer cells. Report any side effects. Continue your course of treatment even though you feel ill unless your care team tells you  to stop. You may need blood work done while you are taking this medication. This medication can make you more sensitive to the sun. Keep out of the sun while taking this medication and for 2 months after the last dose. If you cannot avoid being in the sun, wear protective clothing and sunscreen. Do not use sun lamps, tanning beds, or tanning booths. This medication can cause serious infusion reactions. To reduce the  risk, your care team may give you other medications to take before receiving this one. Be sure to follow the directions of your care team. This medication may cause serious skin reactions. They can happen weeks to months after starting the medication. Contact your care team right away if you notice fevers or flu-like symptoms with a rash. The rash may be red or purple and then turn into blisters or peeling of the skin. You may also notice a red rash with swelling of the face, lips, or lymph nodes in your neck or under your arms. Talk to your care team if you may be pregnant. Serious birth defects can occur if you take this medication during pregnancy and for 2 months after the last dose. You will need a negative pregnancy test before starting this medication. Contraception is recommended while taking this medication and for 2 months after the last dose. Your care team can help you find the option that works for you. Do not breastfeed while taking this medication and for 2 months after the last dose. This medication may cause infertility. Talk to your care team if you are concerned about your fertility. What side effects may I notice from receiving this medication? Side effects that you should report to your care team as soon as possible: Allergic reactions--skin rash, itching, hives, swelling of the face, lips, tongue, or throat Dry cough, shortness of breath or trouble breathing Heart attack--pain or tightness in the chest, shoulders, arms, or jaw, nausea, shortness of breath, cold or clammy skin, feeling faint or lightheaded Infusion reactions--chest pain, shortness of breath or trouble breathing, feeling faint or lightheaded Low calcium level--muscle pain or cramps, confusion, tingling, or numbness in the hands or feet Low magnesium level--muscle pain or cramps, unusual weakness or fatigue, fast or irregular heartbeat, tremors Low potassium level--muscle pain or cramps, unusual weakness or fatigue,  fast or irregular heartbeat, constipation Redness, blistering, peeling, or loosening of the skin, including inside the mouth Side effects that usually do not require medical attention (report to your care team if they continue or are bothersome): Diarrhea Headache Joint pain Nausea Unusual weakness or fatigue Weight loss This list may not describe all possible side effects. Call your doctor for medical advice about side effects. You may report side effects to FDA at 1-800-FDA-1088. Where should I keep my medication? This medication is given in a hospital or clinic. It will not be stored at home. NOTE: This sheet is a summary. It may not cover all possible information. If you have questions about this medicine, talk to your doctor, pharmacist, or health care provider.  2024 Elsevier/Gold Standard (2021-05-16 00:00:00)   BELOW ARE SYMPTOMS THAT SHOULD BE REPORTED IMMEDIATELY: *FEVER GREATER THAN 100.4 F (38 C) OR HIGHER *CHILLS OR SWEATING *NAUSEA AND VOMITING THAT IS NOT CONTROLLED WITH YOUR NAUSEA MEDICATION *UNUSUAL SHORTNESS OF BREATH *UNUSUAL BRUISING OR BLEEDING *URINARY PROBLEMS (pain or burning when urinating, or frequent urination) *BOWEL PROBLEMS (unusual diarrhea, constipation, pain near the anus) TENDERNESS IN MOUTH AND THROAT WITH OR WITHOUT PRESENCE OF ULCERS (sore  throat, sores in mouth, or a toothache) UNUSUAL RASH, SWELLING OR PAIN  UNUSUAL VAGINAL DISCHARGE OR ITCHING   Items with * indicate a potential emergency and should be followed up as soon as possible or go to the Emergency Department if any problems should occur.  Please show the CHEMOTHERAPY ALERT CARD or IMMUNOTHERAPY ALERT CARD at check-in to the Emergency Department and triage nurse.  Should you have questions after your visit or need to cancel or reschedule your appointment, please contact [facility] [place] - A DEPT OF [facility] [phone]  and follow the prompts.  Office  hours are 8:00 a.m. to 4:30 p.m. Monday - Friday. Please note that voicemails left after 4:00 p.m. may not be returned until the following business day.  We are closed weekends and major holidays. You have access to a nurse at all times for urgent questions. Please call the main number to the clinic [phone] and follow the prompts.  For any non-urgent questions, you may also contact your provider using MyChart. We now offer e-Visits for anyone 13 and older to request care online for non-urgent symptoms. For details visit [email or web address].   Also download the MyChart app! Go to the app store, search "MyChart", open the app, select [place], and log in with your MyChart username and password.

## 2022-12-25 ENCOUNTER — Ambulatory Visit (HOSPITAL_COMMUNITY)
Admission: RE | Admit: 2022-12-25 | Discharge: 2022-12-25 | Disposition: A | Discharge status: Home / Self Care | Payer: 59 | Source: Ambulatory Visit | Attending: Hematology | Admitting: Hematology

## 2022-12-25 DIAGNOSIS — R42 Dizziness and giddiness: Secondary | ICD-10-CM | POA: Diagnosis not present

## 2022-12-25 DIAGNOSIS — G319 Degenerative disease of nervous system, unspecified: Secondary | ICD-10-CM | POA: Diagnosis not present

## 2022-12-25 DIAGNOSIS — C049 Malignant neoplasm of floor of mouth, unspecified: Secondary | ICD-10-CM | POA: Insufficient documentation

## 2022-12-25 MED ORDER — GADOBUTROL 1 MMOL/ML IV SOLN
7.0000 mL | Freq: Once | INTRAVENOUS | Status: AC | PRN
  Administered 2022-12-25: 7 mL via INTRAVENOUS

## 2022-12-29 ENCOUNTER — Other Ambulatory Visit: Payer: Self-pay

## 2022-12-30 NOTE — Progress Notes (Signed)
[facility] [address] [place] [address]    Clinic Day:  12/31/2022  Referring physician: [name], MD  Patient Care Team: [name], MD as PCP - General (Internal Medicine) [name], MD as Consulting Physician (Urology) [name], MD as Medical Oncologist (Medical Oncology) [name], RN as Oncology Nurse Navigator (Medical Oncology)   ASSESSMENT & PLAN:   Assessment: 1.  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  Enlarged contralateral right submandibular node.  Enlargement of the left submandibular gland probably reactive.  No abscess. - Biopsy (07/18/2021) floor of the mouth: Invasive well to moderately differentiated keratinizing squamous cell carcinoma.  Tumor cells negative for p16. - 10/14/2021: Floor of the mouth resection, tracheostomy, bilateral selective neck dissections zones 1-3 by Dr. [name] - Pathology: Invasive moderately differentiated keratinizing SCC, 4.1 cm, carcinoma invades for a depth of about 1.9 cm and involves saliva gland tissue.  Anterior, posterior, right, left resection margins are involved.  Deep resection margin is negative.  Metastatic carcinoma 1/3 level 1 lymph nodes.  3 level 2 and 3 level 3 lymph nodes negative for carcinoma.  0/10 lymph nodes involved in the left side neck zone 1, 2, 3 dissection.  ENE not identified.  LVI/perineural invasion not identified. - I have talked to pathologist.  Reexcision margins were considered negative.  He has a high risk feature which is T4 tumor. -  PET scan on 12/12/2021: Soft tissue thickening and calcification along the ventral aspect of the trachea with associated hypermetabolism corresponding to recent tracheostomy site.  7 mm  right paratracheal lymph node new with SUV 2.4.  Hypermetabolism along the dome of the right hepatic lobe with probable 2.4 cm low-attenuation lesion - Liver lesion biopsy (01/28/2022): Metastatic squamous cell carcinoma, keratinizing. - NGS testing: PD-L1 (22 C3): CPS: 100, CD274 (PD-L1) amplified, JAK2 amplified, PIK3CA pathogenic variant exon 10, T p53 pathogenic variant, MS-stable, TMB-low - Per Dr. [name], he also has local recurrence of disease in the floor of the mouth. - Keytruda from 03/10/2022 through 08/24/2022 with progression - XRT to the floor of the mouth on 06/29/2022 - PET scan 09/10/2022: Liver lesion increased in size measures 3.1 x 2.9 cm, previously 2.6 x 1.7 cm.  Right cervical lymph node meta stasis, mildly progressive. - HER2 by IHC 0 on biopsy from 01/28/2022. - Weekly cetuximab started on 10/07/2022   2.  Social/family history: - He lives in his apartment by himself and is independent of ADLs and IADLs.  He does not drive.  He worked as a Curator and several other jobs.  Quit smoking 1 month ago.  He smoked 1 pack/week for more than 50 years. - 1 brother had agent orange related cancer.  Another brother also had cancer, type unknown to the patient.   3.  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 - He was on close surveillance rather than adjuvant chemotherapy.    Plan: 1.  Stage IV (PT4PN1 M1) SCC of the floor of the mouth, p16 negative: - He is tolerating cetuximab reasonably well. - Reviewed MRI brain from 12/25/2022: No evidence of intracranial metastasis. - Reviewed PET scan (12/24/2022): Hypermetabolic anterior floor of mouth mass stable.  Stable right level 1B nodal metastasis.  However hypermetabolic 4.6 cm right liver dome metastasis increased in size and metabolic some by 8 mm.  No new sites of hypermetabolic metastatic disease. - We have discussed that he has progressed on cetuximab based on the lung lesion even though most of  the head and neck area is stable.  We discussed systemic chemotherapy options.  But given his overall status, I do not believe he is a reasonable candidate.  We also spoke about best supportive care in the form of hospice.  He and his sister are not ready for hospice yet.  His sister reports that his functional status has improved dramatically since he was started on cetuximab. - I will reach out to our radiation oncology colleagues to see if SBRT to the liver lesion is a possibility.  Will also order MRI of the liver with and without contrast to better delineate the region. - Will hold his cetuximab today.  Will reevaluate in 2 weeks.   2.  Oral pain: - Continue liquid hydrocodone twice daily as needed.   3.  Cetuximab skin rash: - Continue doxycycline 100 mg twice daily and clindamycin gel twice daily.   4.  Hypotension: - He will continue to hold antihypertensives.  Blood pressure today is 126/57.  5.  Hypomagnesemia: - He is taking magnesium 3 times daily.  However his magnesium is low at 1.1 due to cetuximab.  He will receive IV magnesium today.    Orders Placed This Encounter  Procedures   MR LIVER W WO CONTRAST    Standing Status:   Future    Expected Date:   01/07/2023    Expiration Date:   12/31/2023    If indicated for the ordered procedure, I authorize the administration of contrast media per Radiology protocol:   Yes    What is the patient's sedation requirement?:   No Sedation    Does the patient have a pacemaker or implanted devices?:   No    Release to patient:   Immediate    Preferred imaging location?:   [facility] (table limit - 550lbs)       [name],acting as a scribe for [name], MD.,have documented all relevant documentation on the behalf of [name], MD,as directed by  [name], MD while in the presence of [name], MD.  I, [name] MD, have reviewed the above documentation for accuracy  and completeness, and I agree with the above.       [name], MD   12/19/20244:35 PM  CHIEF COMPLAINT:   Diagnosis: squamous cell carcinoma of the floor of the mouth and testicular seminoma    Cancer Staging  Cancer of floor of mouth [facility]) Staging form: Oral Cavity, AJCC 8th Edition - Clinical stage from 11/25/2021: Stage IVC (cT4a, cN1, pM1) - Signed by [name], MD on 02/04/2022  Testicle cancer, right Staging form: Testis, AJCC 7th Edition - Clinical: Stage I (T2, N0, M0) - Signed by [name], PA-C on 07/23/2013    Prior Therapy: 1. Pembrolizumab, 03/10/22 - 08/24/22 2. 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 Testis, biopsy, right - SEMINOMA. PLEASE SEE COMMENT. 2. Testis, tumor, right - SEMINOMA, 7.5 CM. - ANGIOLYMPHATIC INVASION PRESENT. - RESECTION MARGINS, NEGATIVE FOR ATYPIA OR MALIGNANCY.   01/18/2013 PET scan   Status post right  orchiectomy. No findings specific for metastatic disease. Small para-aortic nodes measuring up to 5 mm short axis, without convincing hypermetabolism. Given location, attention on follow-up is suggested.   04/17/2013 Imaging   CT CAP- No evidence of metastatic disease in the chest, abdomen or pelvis. Proximal LAD coronary artery calcification.   07/20/2013 Imaging   CT abd/pelvis- No evidence of metastatic disease in the abdomen or pelvis.   10/24/2013 Imaging   CT abd/pelvis- No findings to suggest metastatic disease in the abdomen or pelvis   10/24/2013 Imaging   Chest xray- Probable COPD.  Negative for metastatic disease.   02/21/2014 Imaging   CT abd/pelvis- Stable abdominal pelvic CT status post right orchectomy. No evidence of adenopathy or other metastatic disease.   02/21/2014 Imaging   Chest xray- Left lower lobe mild atelectasis and/or  infiltrate.     09/10/2014 Imaging   CT abd/pelvis- Status post right orchiectomy.   No evidence of metastatic disease.   3 mm nonobstructing right upper pole renal calculus. No hydronephrosis.   03/14/2015 Imaging   CT abd/pelvis- Stable exam. No evidence of metastatic disease or other acute findings within the abdomen or pelvis.   09/13/2015 Imaging   CT abd/pelvis- No acute findings and no evidence for mass or adenopathy.    04/24/2016 Imaging   CT abd/pelvis: IMPRESSION: 1. Stable exam. No new or progressive findings. No features to suggest metastatic disease.   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HCC)  10/14/2021 Initial Diagnosis   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HCC)   11/25/2021 Cancer Staging   Staging form: Oral Cavity, AJCC 8th Edition - Clinical stage from 11/25/2021: Stage IVC (cT4a, cN1, pM1) - Signed by [name], MD on 02/04/2022 Histopathologic type: Squamous cell carcinoma, NOS Stage prefix: Initial diagnosis Histologic grade (G): G2 Histologic grading system: 3 grade system   03/10/2022 - 08/24/2022 Chemotherapy   Patient is on Treatment Plan : HEAD/NECK Pembrolizumab (200) q21d     10/07/2022 -  Chemotherapy   Patient is on Treatment Plan : HEAD/NECK Cetuximab q7d        INTERVAL HISTORY:   Nathaniel Hicks is a 82 y.o. male presenting to clinic today for follow up of squamous cell carcinoma of the floor of the mouth and testicular seminoma. He was last seen by me on 11/19/22.  Since his last visit, he underwent restaging PET on 12/24/22 that found: hypermetabolic anterior floor of mouth mass, stable in size and mildly decreased in metabolism; stable associated lytic erosion of the anterior midline mandible; stable hypermetabolic right level Ib nodal metastasis; right level IV neck nodal metastasis is mildly increased in size and metabolism; hypermetabolic 4.6 cm right liver dome metastasis, increased in size and metabolism; and no new sites of hypermetabolic metastatic disease.  He  had a brain MRI on 12/25/22 that found: no acute intracranial process and no evidence of intracranial metastatic disease.   Today, he states that he is doing well overall. His appetite level is at 100%. His energy level is at 75%. He is accompanied by his sister. He reports infusions are causing finger cuts and soreness on the heels. He notes one episode of severe dysuria. His sister states he complains often of the sensation of his skin crawling, particularly on the hands. He reports taking OTC Magnesium caused dizziness that has improved recently, which he takes three times a day. He would like to continue treatment if possible and a refill of liquid hydrocodone for oral pain.   PAST MEDICAL HISTORY:  Past Medical History: Past Medical History:  Diagnosis Date   Arthritis    BPH (benign prostatic hyperplasia)    Difficult intubation    HOH (hard of hearing)    Hyperlipidemia 11/02/2017   Hypertension    Hypertension 11/02/2017   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HCC)    2014   Vitamin D deficiency 11/02/2017    Surgical History: Past Surgical History:  Procedure Laterality Date   COLONOSCOPY N/A 11/24/2012   Procedure: COLONOSCOPY;  Surgeon: Malissa Hippo, MD;  Location: [facility];  Service: Endoscopy;  Laterality: N/A;  830-moved to 730 Ann notified pt   FLOOR OF MOUTH BIOPSY N/A 10/14/2021   Procedure: FLOOR OF MOUTH RESECTION;  Surgeon: Christia Reading, MD;  Location: [facility] OR;  Service: ENT;  Laterality: N/A;   HEMORROIDECTOMY     KNEE ARTHROSCOPY WITH LATERAL MENISECTOMY Right 08/31/2017   Procedure: KNEE ARTHROSCOPY WITH LATERAL MENISECTOMY;  Surgeon: Vickki Hearing, MD;  Location: [facility] ORS;  Service: Orthopedics;  Laterality: Right;   LESION EXCISION N/A 03/23/2012   Procedure: EXCISION NEOPLASM SCALP ;  Surgeon: Dalia Heading, MD;  Location: [facility]S;  Service: General;  Laterality: N/A;  Excision of Scalp Neoplasm   ORCHIECTOMY Right 12/20/2012   Procedure: RIGHT RADICAL  ORCHIECTOMY/POSSIBLE BX RIGHT TESTICLE;  Surgeon: Ky Barban, MD;  Location: [facility];  Service: Urology;  Laterality: Right;   PORTACATH PLACEMENT Left 03/25/2022   Procedure: INSERTION PORT-A-CATH;  Surgeon: Franky Macho, MD;  Location: [facility] OR[facility];  Service: General;  Laterality: Left;   PROSTATE SURGERY     RADICAL NECK DISSECTION Bilateral 10/14/2021   Procedure: NECK DISSECTION;  Surgeon: Christia Reading, MD;  Location: [facility] OR;  Service: ENT;  Laterality: Bilateral;   SCALP LACERATION REPAIR     [facility]-Dr Katrinka Blazing   SKIN FULL THICKNESS GRAFT Bilateral 10/14/2021   Procedure: PLATYSMA FLAP CLOSURE;  Surgeon: Christia Reading, MD;  Location: [facility] OR;  Service: ENT;  Laterality: Bilateral;   TOOTH EXTRACTION  10/14/2021   Procedure: DENTAL EXTRACTIONS;  Surgeon: Christia Reading, MD;  Location: [facility] OR;  Service: ENT;;   TRACHEOSTOMY TUBE PLACEMENT N/A 10/14/2021   Procedure: TRACHEOSTOMY;  Surgeon: Christia Reading, MD;  Location[facility] OR;  Service: ENT;  Laterality: N/A;    Social History: Social History   Socioeconomic History   Marital status: Single    Spouse name: Not on file   Number of children: Not on file   Years of education: Not on file   Highest education level: Not on file  Occupational History   Not on file  Tobacco Use   Smoking status: Former    Current packs/day: 0.25    Average packs/day: 0.3 packs/day for 50.0 years (12.5 ttl pk-yrs)    Types: Cigarettes   Smokeless tobacco: Never  Vaping Use   Vaping status: Never Used  Substance and Sexual Activity   Alcohol use: Not Currently   Drug use: No   Sexual activity: Yes    Birth control/protection: None  Other Topics Concern   Not on file  Social History Narrative   Not on file   Social Drivers of Health   Financial Resource Strain: Not on file  Food Insecurity: Not on file  Transportation Needs: Not on file  Physical Activity: Not on file  Stress: Not on file  Social Connections: Not on file  Intimate Partner Violence:  Not on file    Family History: Family History  Problem Relation Age of Onset   Cancer Brother  Cancer Brother     Current Medications:  Current Outpatient Medications:    acetaminophen (TYLENOL) 650 MG CR tablet, Take 650 mg by mouth every 8 (eight) hours as needed for pain., Disp: , Rfl:    amLODipine-olmesartan (AZOR) 5-20 MG tablet, TAKE 1 TABLET BY MOUTH DAILY, Disp: 90 tablet, Rfl: 0   budesonide-formoterol (SYMBICORT) 80-4.5 MCG/ACT inhaler, Inhale 2 puffs into the lungs in the morning and at bedtime., Disp: 1 each, Rfl: 12   Cholecalciferol (VITAMIN D3) 50 MCG (2000 UT) TABS, Take 2,000 Units by mouth daily., Disp: , Rfl:    ciprofloxacin-dexamethasone (CIPRODEX) OTIC suspension, Place 4 drops into the left ear 2 (two) times daily., Disp: , Rfl:    clindamycin (CLINDAGEL) 1 % gel, Apply topically 2 (two) times daily. Apply twice daily to chest rash, Disp: 30 g, Rfl: 0   doxycycline (VIBRA-TABS) 100 MG tablet, Take 1 tablet (100 mg total) by mouth 2 (two) times daily., Disp: 60 tablet, Rfl: 3   lidocaine (XYLOCAINE) 2 % solution, Take by mouth., Disp: , Rfl:    magnesium oxide (MAG-OX) 400 MG tablet, Take 1 tablet (400 mg total) by mouth in the morning, at noon, and at bedtime., Disp: 90 tablet, Rfl: 3   Menthol, Topical Analgesic, (BIOFREEZE EX), Apply 1 application  topically daily as needed (pain)., Disp: , Rfl:    naloxone (NARCAN) nasal spray 4 mg/0.1 mL, SMARTSIG:Both Nares, Disp: , Rfl:    Polyethyl Glycol-Propyl Glycol (GOODSENSE LUBRICANT EYE DROPS OP), Place 1 drop into both eyes daily as needed (dry eyes)., Disp: , Rfl:    pravastatin (PRAVACHOL) 80 MG tablet, Take 80 mg by mouth daily., Disp: , Rfl:    predniSONE (DELTASONE) 10 MG tablet, Take  4 each am x 2 days,   2 each am x 2 days,  1 each am x 2 days and stop, Disp: 14 tablet, Rfl: 0   RAPAFLO 8 MG CAPS capsule, Take 8 mg by mouth daily. Silodosin, Disp: , Rfl:    vitamin B-12 (CYANOCOBALAMIN) 500 MCG tablet, Take  500 mcg by mouth daily., Disp: , Rfl:    HYDROcodone-acetaminophen (HYCET) 7.5-325 mg/15 ml solution, Take 15 mLs by mouth every 6 (six) hours as needed for moderate pain (pain score 4-6)., Disp: 473 mL, Rfl: 0 No current facility-administered medications for this visit.  Facility-Administered Medications Ordered in Other Visits:    0.9 %  sodium chloride infusion, , Intravenous, Continuous, [name], MD, Stopped at 12/31/22 1224   Allergies: No Known Allergies  REVIEW OF SYSTEMS:   Review of Systems  Constitutional:  Negative for chills, fatigue and fever.  HENT:   Negative for lump/mass, mouth sores, nosebleeds, sore throat and trouble swallowing.   Eyes:  Negative for eye problems.  Respiratory:  Negative for cough and shortness of breath.   Cardiovascular:  Negative for chest pain, leg swelling and palpitations.  Gastrointestinal:  Negative for abdominal pain, constipation, diarrhea, nausea and vomiting.  Genitourinary:  Positive for dysuria. Negative for bladder incontinence, difficulty urinating, frequency, hematuria and nocturia.   Musculoskeletal:  Negative for arthralgias, back pain, flank pain, myalgias and neck pain.  Skin:  Negative for itching and rash.  Neurological:  Negative for dizziness, headaches and numbness.  Hematological:  Does not bruise/bleed easily.  Psychiatric/Behavioral:  Negative for depression, sleep disturbance and suicidal ideas. The patient is not nervous/anxious.   All other systems reviewed and are negative.    VITALS:   Height 5\' 11"  (1.803 m), weight 157  lb (71.2 kg).  Wt Readings from Last 3 Encounters:  12/31/22 157 lb (71.2 kg)  12/24/22 155 lb 1.6 oz (70.4 kg)  12/17/22 153 lb 6.4 oz (69.6 kg)    Body mass index is 21.9 kg/m.  Performance status (ECOG): 1 - Symptomatic but completely ambulatory  PHYSICAL EXAM:   Physical Exam Vitals and nursing note reviewed. Exam conducted with a chaperone present.  Constitutional:       Appearance: Normal appearance.  Cardiovascular:     Rate and Rhythm: Normal rate and regular rhythm.     Pulses: Normal pulses.     Heart sounds: Normal heart sounds.  Pulmonary:     Effort: Pulmonary effort is normal.     Breath sounds: Normal breath sounds.  Abdominal:     Palpations: Abdomen is soft. There is no hepatomegaly, splenomegaly or mass.     Tenderness: There is no abdominal tenderness.  Musculoskeletal:     Right lower leg: No edema.     Left lower leg: No edema.  Lymphadenopathy:     Cervical: No cervical adenopathy.     Right cervical: No superficial, deep or posterior cervical adenopathy.    Left cervical: No superficial, deep or posterior cervical adenopathy.     Upper Body:     Right upper body: No supraclavicular or axillary adenopathy.     Left upper body: No supraclavicular or axillary adenopathy.  Neurological:     General: No focal deficit present.     Mental Status: He is alert and oriented to person, place, and time.  Psychiatric:        Mood and Affect: Mood normal.        Behavior: Behavior normal.     LABS:      Latest Ref Rng & Units 12/31/2022    9:10 AM 12/24/2022    8:56 AM 12/17/2022    9:55 AM  CBC  WBC 4.0 - 10.5 K/uL 6.8  6.5  6.9   Hemoglobin 13.0 - 17.0 g/dL 44.0  34.7  42.5   Hematocrit 39.0 - 52.0 % 37.0  37.8  37.2   Platelets 150 - 400 K/uL 238  255  250       Latest Ref Rng & Units 12/31/2022    9:10 AM 12/24/2022    8:56 AM 12/17/2022    9:55 AM  CMP  Glucose 70 - 99 mg/dL 956  387  564   BUN 8 - 23 mg/dL 9  13  14    Creatinine 0.61 - 1.24 mg/dL 3.32  9.51  8.84   Sodium 135 - 145 mmol/L 137  136  136   Potassium 3.5 - 5.1 mmol/L 3.8  4.2  4.8   Chloride 98 - 111 mmol/L 104  104  104   CO2 22 - 32 mmol/L 26  25  26    Calcium 8.9 - 10.3 mg/dL 9.7  9.7  9.4   Total Protein 6.5 - 8.1 g/dL 6.4  6.7  6.5   Total Bilirubin <1.2 mg/dL 0.6  0.6  0.5   Alkaline Phos 38 - 126 U/L 48  47  46   AST 15 - 41 U/L 19  18  17     ALT 0 - 44 U/L 17  15  15       Lab Results  Component Value Date   CEA 2.2 01/19/2013   /  CEA  Date Value Ref Range Status  01/19/2013 2.2 0.0 - 5.0 ng/mL Final  Comment:    Performed at [facility]   No results found for: "PSA1" No results found for: "LKG401" No results found for: "CAN125"  No results found for: "TOTALPROTELP", "ALBUMINELP", "A1GS", "A2GS", "BETS", "BETA2SER", "GAMS", "MSPIKE", "SPEI" Lab Results  Component Value Date   TIBC 244 (L) 08/04/2022   TIBC 251 05/12/2022   FERRITIN 273 08/04/2022   FERRITIN 195 05/12/2022   IRONPCTSAT 19 08/04/2022   IRONPCTSAT 18 05/12/2022   Lab Results  Component Value Date   LDH 172 11/12/2020   LDH 175 10/26/2018   LDH 179 10/25/2013     STUDIES:   NM PET Image Restage (PS) Skull Base to Thigh (F-18 FDG) Result Date: 12/30/2022 CLINICAL DATA:  Subsequent treatment strategy for floor of mouth cancer completed radiation 1 month prior and chemotherapy 2 weeks prior. EXAM: NUCLEAR MEDICINE PET SKULL BASE TO THIGH TECHNIQUE: 7.8 mCi F-18 FDG was injected intravenously. Full-ring PET imaging was performed from the skull base to thigh after the radiotracer. CT data was obtained and used for attenuation correction and anatomic localization. Fasting blood glucose: 113 mg/dl COMPARISON:  02/72/5366 PET-CT. FINDINGS: Mediastinal blood pool activity: SUV max 1.6 Liver activity: SUV max NA NECK: Hypermetabolic solid 4.2 x 2.8 cm anterior floor of mouth mass with max SUV 10.4 (series 202/image 23), previously 4.2 x 2.7 cm with max SUV 14.8, stable size and mildly decreased in metabolism. Stable associated lytic erosion of the anterior midline mandible. Hypermetabolic level Ib right neck 1.1 cm node with max SUV 7.5 (series 202/image 28), previously 1.1 cm with max SUV 6.9, not substantially changed. Hypermetabolic right level IV neck 1.3 cm node with max SUV 9.4 (series 202/image 39), previously 1.1 cm with max SUV 6.8, mildly  increased in size and metabolism. No additional enlarged or hypermetabolic neck nodes. Incidental CT findings: None. CHEST: No enlarged or hypermetabolic axillary, mediastinal or hilar lymph nodes. No hypermetabolic pulmonary findings. Incidental CT findings: Left subclavian Port-A-Cath terminates in the upper third of the SVC. Coronary atherosclerosis. Atherosclerotic nonaneurysmal thoracic aorta. Mild centrilobular emphysema. Tiny solid 0.2 cm peripheral right middle lobe pulmonary nodule on series 203/image 33), unchanged and below PET resolution. ABDOMEN/PELVIS: Hypermetabolic 4.6 cm right liver dome mass with max SUV 8.9 (series 202/image 78), previously 3.8 cm with max SUV 3.8, increased in size and metabolism. No additional hypermetabolic liver masses. No abnormal hypermetabolic activity within the pancreas, adrenal glands, or spleen. No hypermetabolic lymph nodes in the abdomen or pelvis. Incidental CT findings: Nonobstructing 3 mm lower right renal stone. Simple small bilateral renal cysts, largest 1.3 cm in the lateral upper right kidney, for which no follow-up imaging is recommended. Atherosclerotic nonaneurysmal abdominal aorta. Moderate prostatomegaly. SKELETON: No additional foci of hypermetabolic activity to suggest skeletal metastasis. Incidental CT findings: None. IMPRESSION: 1. Hypermetabolic anterior floor of mouth mass, stable in size and mildly decreased in metabolism. Stable associated lytic erosion of the anterior midline mandible. 2. Stable hypermetabolic right level Ib nodal metastasis. Right level IV neck nodal metastasis is mildly increased in size and metabolism. 3. Hypermetabolic 4.6 cm right liver dome metastasis, increased in size and metabolism. 4. No new sites of hypermetabolic metastatic disease. 5. Aortic Atherosclerosis (ICD10-I70.0) and Emphysema (ICD10-J43.9). Electronically Signed   By: Delbert Phenix M.D.   On: 12/30/2022 16:34   MR Brain W Wo Contrast Result Date:  12/30/2022 CLINICAL DATA:  Metastatic disease evaluation, staging EXAM: MRI HEAD WITHOUT AND WITH CONTRAST TECHNIQUE: Multiplanar, multiecho pulse sequences of the brain and surrounding structures  were obtained without and with intravenous contrast. CONTRAST:  7mL GADAVIST GADOBUTROL 1 MMOL/ML IV SOLN COMPARISON:  No prior MRI available FINDINGS: Brain: No restricted diffusion to suggest acute or subacute infarct. No abnormal parenchymal or meningeal enhancement. No acute hemorrhage, mass, mass effect, or midline shift. No hydrocephalus or extra-axial collection. Pituitary and craniocervical junction within normal limits. 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 No hemosiderin deposition to suggest remote hemorrhage. Advanced cerebral atrophy for age, most prominent in the parietal lobes. 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 Vascular: Normal arterial flow voids. Normal arterial and venous enhancement. Skull and upper cervical spine: Normal marrow signal. Sinuses/Orbits: Mucosal thickening in the left frontal sinus and anterior ethmoid air cells. No acute finding in the orbits. Status post bilateral lens replacements. Other: Fluid in the right-greater-than-left mastoid air cells. IMPRESSION: No acute intracranial process. No evidence of intracranial metastatic disease. Electronically Signed   By: Wiliam Ke M.D.   On: 12/30/2022 15:55

## 2022-12-31 ENCOUNTER — Inpatient Hospital Stay: Payer: 59

## 2022-12-31 ENCOUNTER — Inpatient Hospital Stay (HOSPITAL_BASED_OUTPATIENT_CLINIC_OR_DEPARTMENT_OTHER): Payer: 59 | Admitting: Hematology

## 2022-12-31 ENCOUNTER — Other Ambulatory Visit: Payer: Self-pay

## 2022-12-31 VITALS — BP 126/57 | HR 106 | Temp 96.6°F | Resp 18

## 2022-12-31 VITALS — Ht 71.0 in | Wt 157.0 lb

## 2022-12-31 DIAGNOSIS — C049 Malignant neoplasm of floor of mouth, unspecified: Secondary | ICD-10-CM

## 2022-12-31 DIAGNOSIS — Z95828 Presence of other vascular implants and grafts: Secondary | ICD-10-CM

## 2022-12-31 DIAGNOSIS — Z5111 Encounter for antineoplastic chemotherapy: Secondary | ICD-10-CM | POA: Diagnosis not present

## 2022-12-31 DIAGNOSIS — C787 Secondary malignant neoplasm of liver and intrahepatic bile duct: Secondary | ICD-10-CM | POA: Diagnosis not present

## 2022-12-31 LAB — COMPREHENSIVE METABOLIC PANEL
ALT: 17 U/L (ref 0–44)
AST: 19 U/L (ref 15–41)
Albumin: 3.2 g/dL — ABNORMAL LOW (ref 3.5–5.0)
Alkaline Phosphatase: 48 U/L (ref 38–126)
Anion gap: 7 (ref 5–15)
BUN: 9 mg/dL (ref 8–23)
CO2: 26 mmol/L (ref 22–32)
Calcium: 9.7 mg/dL (ref 8.9–10.3)
Chloride: 104 mmol/L (ref 98–111)
Creatinine, Ser: 0.65 mg/dL (ref 0.61–1.24)
GFR, Estimated: >60 mL/min (ref >60)
Glucose, Bld: 123 mg/dL — ABNORMAL HIGH (ref 70–99)
Potassium: 3.8 mmol/L (ref 3.5–5.1)
Sodium: 137 mmol/L (ref 135–145)
Total Bilirubin: 0.6 mg/dL (ref <1.2)
Total Protein: 6.4 g/dL — ABNORMAL LOW (ref 6.5–8.1)

## 2022-12-31 LAB — CBC WITH DIFFERENTIAL/PLATELET
Abs Immature Granulocytes: 0.01 10*3/uL (ref 0.00–0.07)
Basophils Absolute: 0.1 10*3/uL (ref 0.0–0.1)
Basophils Relative: 1 %
Eosinophils Absolute: 0.3 10*3/uL (ref 0.0–0.5)
Eosinophils Relative: 4 %
HCT: 37 % — ABNORMAL LOW (ref 39.0–52.0)
Hemoglobin: 11.9 g/dL — ABNORMAL LOW (ref 13.0–17.0)
Immature Granulocytes: 0 %
Lymphocytes Relative: 16 %
Lymphs Abs: 1.1 10*3/uL (ref 0.7–4.0)
MCH: 33 pg (ref 26.0–34.0)
MCHC: 32.2 g/dL (ref 30.0–36.0)
MCV: 102.5 fL — ABNORMAL HIGH (ref 80.0–100.0)
Monocytes Absolute: 0.7 10*3/uL (ref 0.1–1.0)
Monocytes Relative: 10 %
Neutro Abs: 4.7 10*3/uL (ref 1.7–7.7)
Neutrophils Relative %: 69 %
Platelets: 238 10*3/uL (ref 150–400)
RBC: 3.61 MIL/uL — ABNORMAL LOW (ref 4.22–5.81)
RDW: 13.6 % (ref 11.5–15.5)
WBC: 6.8 10*3/uL (ref 4.0–10.5)
nRBC: 0 % (ref 0.0–0.2)

## 2022-12-31 LAB — MAGNESIUM: Magnesium: 1.1 mg/dL — ABNORMAL LOW (ref 1.7–2.4)

## 2022-12-31 MED ORDER — SODIUM CHLORIDE FLUSH 0.9 % IV SOLN
10.0000 mL | Freq: Once | INTRAVENOUS | Status: AC
Start: 2022-12-31 — End: 2022-12-31
  Administered 2022-12-31: 10 mL via INTRAVENOUS
  Filled 2022-12-31: qty 10

## 2022-12-31 MED ORDER — HYDROCODONE-ACETAMINOPHEN 7.5-325 MG/15ML PO SOLN: 15.0000 mL | Freq: Four times a day (QID) | ORAL | 0 refills | Status: DC | PRN

## 2022-12-31 MED ORDER — HEPARIN SOD (PORK) LOCK FLUSH 100 UNIT/ML IV SOLN
500.0000 [IU] | Freq: Once | INTRAVENOUS | Status: AC
Start: 2022-12-31 — End: 2022-12-31
  Administered 2022-12-31: 500 [IU] via INTRAVENOUS

## 2022-12-31 MED ORDER — MAGNESIUM SULFATE 4 GM/100ML IV SOLN
4.0000 g | Freq: Once | INTRAVENOUS | Status: AC
  Administered 2022-12-31: 4 g via INTRAVENOUS
  Filled 2022-12-31: qty 100

## 2022-12-31 MED ORDER — SODIUM CHLORIDE 0.9% FLUSH
10.0000 mL | INTRAVENOUS | Status: DC | PRN
  Administered 2022-12-31: 10 mL via INTRAVENOUS

## 2022-12-31 MED ORDER — SODIUM CHLORIDE 0.9 % IV SOLN: INTRAVENOUS | Status: DC

## 2022-12-31 NOTE — Progress Notes (Signed)
No treatment today per Dr. Ellin Saba, 4g of Mag given her orders. Patient tolerated therapy with no complaints voiced. Side effects with management reviewed with understanding verbalized. Port site clean and dry with no bruising or swelling noted at site. Good blood return noted before and after administration of therapy. Band aid applied. Patient left in satisfactory condition with VSS and no s/s of distress noted.

## 2022-12-31 NOTE — Patient Instructions (Signed)
[facility] at [facility] Discharge Instructions   You were seen and examined today by Dr. Ellin Saba.  He reviewed the results of your brain MRI which was normal.   He reviewed the results of your PET scan. 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 It is 8 millimeters bigger than it was on the previous scan.   Dr. 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 The only other option for treatment would be chemotherapy, and Dr. Kirtland Bouchard would not recommend this as he does not think it will be well tolerated.   Dr. Kirtland Bouchard will reach out to radiation oncology to see if they can radiate the spot in the liver to keep in under the control. There is another option to do an ablation to the tumor in the liver. This would be done by an interventional radiologist in [place]. It is not guaranteed that these are available options for treatment, as they are not a traditional way to treat this particular cancer. If one of the above are an option, then we can continue the Erbitux.   Return as scheduled.    Thank you for choosing [facility] at [facility] to provide your oncology and hematology care.  To afford each patient quality time with our provider, please arrive at least 15 minutes before your scheduled appointment time.   If you have a lab appointment with the Cancer Center please come in thru the Main Entrance and check in at the main information desk.  You need to re-schedule your appointment should you arrive 10 or more minutes late.  We strive to give you quality time with our providers, and arriving late affects you and other patients whose appointments are after yours.  Also, if you no show three or more times for appointments you may be dismissed from the clinic at the providers discretion.     Again, thank you for choosing [facility].  Our hope is that these requests will decrease the amount of time that you wait before being seen by our physicians.       _____________________________________________________________  Should you have questions after your visit to [facility], please contact our office at [phone] and follow the prompts.  Our office hours are 8:00 a.m. and 4:30 p.m. Monday - Friday.  Please note that voicemails left after 4:00 p.m. may not be returned until the following business day.  We are closed weekends and major holidays.  You do have access to a nurse 24-7, just call the main number to the clinic [phone] and do not press any options, hold on the line and a nurse will answer the phone.    For prescription refill requests, have your pharmacy contact our office and allow 72 hours.    Due to Covid, you will need to wear a mask upon entering the hospital. If you do not have a mask, a mask will be given to you at the Main Entrance upon arrival. For doctor visits, patients may have 1 support person age 85 or older with them. For treatment visits, patients can not have anyone with them due to social distancing guidelines and our immunocompromised population.

## 2022-12-31 NOTE — Progress Notes (Signed)
Patients port flushed without difficulty.  Good blood return noted with no bruising or swelling noted at site.  Patient remains accessed for treatment.  

## 2022-12-31 NOTE — Patient Instructions (Signed)
[facility] [place] - A DEPT OF [facility]  Discharge Instructions: Thank you for choosing [facility] to provide your oncology and hematology care.  If you have a lab appointment with the Cancer Center - please note that after April 8th, 2024, all labs will be drawn in the cancer center.  You do not have to check in or register with the main entrance as you have in the past but will complete your check-in in the cancer center.  Wear comfortable clothing and clothing appropriate for easy access to any Portacath or PICC line.   We strive to give you quality time with your provider. You may need to reschedule your appointment if you arrive late (15 or more minutes).  Arriving late affects you and other patients whose appointments are after yours.  Also, if you miss three or more appointments without notifying the office, you may be dismissed from the clinic at the provider's discretion.      For prescription refill requests, have your pharmacy contact our office and allow 72 hours for refills to be completed.    Today you received the following Magnesium, return as scheduled.   To help prevent nausea and vomiting after your treatment, we encourage you to take your nausea medication as directed.  BELOW ARE SYMPTOMS THAT SHOULD BE REPORTED IMMEDIATELY: *FEVER GREATER THAN 100.4 F (38 C) OR HIGHER *CHILLS OR SWEATING *NAUSEA AND VOMITING THAT IS NOT CONTROLLED WITH YOUR NAUSEA MEDICATION *UNUSUAL SHORTNESS OF BREATH *UNUSUAL BRUISING OR BLEEDING *URINARY PROBLEMS (pain or burning when urinating, or frequent urination) *BOWEL PROBLEMS (unusual diarrhea, constipation, pain near the anus) TENDERNESS IN MOUTH AND THROAT WITH OR WITHOUT PRESENCE OF ULCERS (sore throat, sores in mouth, or a toothache) UNUSUAL RASH, SWELLING OR PAIN  UNUSUAL VAGINAL DISCHARGE OR ITCHING   Items with * indicate a potential emergency and should be followed up as soon as possible  or go to the Emergency Department if any problems should occur.  Please show the CHEMOTHERAPY ALERT CARD or IMMUNOTHERAPY ALERT CARD at check-in to the Emergency Department and triage nurse.  Should you have questions after your visit or need to cancel or reschedule your appointment, please contact [facility] [place] - A DEPT OF Eligh[facility] [phone]  and follow the prompts.  Office hours are 8:00 a.m. to 4:30 p.m. Monday - Friday. Please note that voicemails left after 4:00 p.m. may not be returned until the following business day.  We are closed weekends and major holidays. You have access to a nurse at all times for urgent questions. Please call the main number to the clinic [phone] and follow the prompts.  For any non-urgent questions, you may also contact your provider using MyChart. We now offer e-Visits for anyone 49 and older to request care online for non-urgent symptoms. For details visit [email or web address].   Also download the MyChart app! Go to the app store, search "MyChart", open the app, select [place], and log in with your MyChart username and password.

## 2023-01-01 ENCOUNTER — Other Ambulatory Visit: Payer: Self-pay

## 2023-01-07 ENCOUNTER — Ambulatory Visit: Payer: 59

## 2023-01-07 ENCOUNTER — Inpatient Hospital Stay: Payer: 59

## 2023-01-07 ENCOUNTER — Inpatient Hospital Stay: Payer: 59 | Admitting: Hematology

## 2023-01-07 ENCOUNTER — Encounter (HOSPITAL_COMMUNITY): Payer: Self-pay | Admitting: Radiology

## 2023-01-07 ENCOUNTER — Ambulatory Visit (HOSPITAL_COMMUNITY)
Admission: RE | Admit: 2023-01-07 | Discharge: 2023-01-07 | Disposition: A | Discharge status: Home / Self Care | Payer: 59 | Source: Ambulatory Visit | Attending: Hematology | Admitting: Hematology

## 2023-01-07 DIAGNOSIS — K769 Liver disease, unspecified: Secondary | ICD-10-CM | POA: Diagnosis not present

## 2023-01-07 DIAGNOSIS — C049 Malignant neoplasm of floor of mouth, unspecified: Secondary | ICD-10-CM | POA: Insufficient documentation

## 2023-01-07 DIAGNOSIS — N281 Cyst of kidney, acquired: Secondary | ICD-10-CM | POA: Diagnosis not present

## 2023-01-07 MED ORDER — GADOBUTROL 1 MMOL/ML IV SOLN
7.0000 mL | Freq: Once | INTRAVENOUS | Status: AC | PRN
  Administered 2023-01-07: 7 mL via INTRAVENOUS

## 2023-01-08 ENCOUNTER — Other Ambulatory Visit: Payer: Self-pay

## 2023-01-13 NOTE — Progress Notes (Signed)
 [facility] [address], [place] [address]    Clinic Day:  01/14/2023  Referring physician: Shona Norleen PEDLAR, MD  Patient Care Team: Shona Norleen PEDLAR, MD as PCP - General (Internal Medicine) Sharleen Carry, MD as Consulting Physician (Urology) Rogers Hai, MD as Medical Oncologist (Medical Oncology) Celestia Joesph SQUIBB, RN as Oncology Nurse Navigator (Medical Oncology)   ASSESSMENT & PLAN:   Assessment: 1.  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  Enlarged contralateral right submandibular node.  Enlargement of the left submandibular gland probably reactive.  No abscess. - Biopsy (07/18/2021) floor of the mouth: Invasive well to moderately differentiated keratinizing squamous cell carcinoma.  Tumor cells negative for p16. - 10/14/2021: Floor of the mouth resection, tracheostomy, bilateral selective neck dissections zones 1-3 by Dr. Carlie - Pathology: Invasive moderately differentiated keratinizing SCC, 4.1 cm, carcinoma invades for a depth of about 1.9 cm and involves saliva gland tissue.  Anterior, posterior, right, left resection margins are involved.  Deep resection margin is negative.  Metastatic carcinoma 1/3 level 1 lymph nodes.  3 level 2 and 3 level 3 lymph nodes negative for carcinoma.  0/10 lymph nodes involved in the left side neck zone 1, 2, 3 dissection.  ENE not identified.  LVI/perineural invasion not identified. - I have talked to pathologist.  Reexcision margins were considered negative.  He has a high risk feature which is T4 tumor. -  PET scan on 12/12/2021: Soft tissue thickening and calcification along the ventral aspect of the trachea with associated hypermetabolism corresponding to recent tracheostomy site.  7 mm right  paratracheal lymph node new with SUV 2.4.  Hypermetabolism along the dome of the right hepatic lobe with probable 2.4 cm low-attenuation lesion - Liver lesion biopsy (01/28/2022): Metastatic squamous cell carcinoma, keratinizing. - NGS testing: PD-L1 (22 C3): CPS: 100, CD274 (PD-L1) amplified, JAK2 amplified, PIK3CA pathogenic variant exon 10, T p53 pathogenic variant, MS-stable, TMB-low - Per Dr. Carlie, he also has local recurrence of disease in the floor of the mouth. - Keytruda  from 03/10/2022 through 08/24/2022 with progression - XRT to the floor of the mouth on 06/29/2022 - PET scan 09/10/2022: Liver lesion increased in size measures 3.1 x 2.9 cm, previously 2.6 x 1.7 cm.  Right cervical lymph node meta stasis, mildly progressive. - HER2 by IHC 0 on biopsy from 01/28/2022. - Weekly cetuximab  started on 10/07/2022   2.  Social/family history: - He lives in his apartment by himself and is independent of ADLs and IADLs.  He does not drive.  He worked as a curator and several other jobs.  Quit smoking 1 month ago.  He smoked 1 pack/week for more than 50 years. - 1 brother had agent orange related cancer.  Another brother also had cancer, type unknown to the patient.   3.  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 - He was on close surveillance rather than adjuvant chemotherapy.    Plan: 1.  Stage IV (PT4PN1 M1) SCC of the floor of the mouth, p16 negative: - PET scan (12/24/2022): Hypermetabolic anterior floor of mouth mass stable.  Stable right level 1B nodal metastasis.  Hypermetabolic 4.6 cm right liver dome metastasis increased in size by 8 mm.  No new sites of  hypermetabolic metastatic disease. - MRI liver (01/07/2023): Right hepatic liver lesion measures 4.8 x 3.3 cm.  No new hepatic lesions seen. - I reached out to Dr. Dewey at [facility].  I was told that it is feasible for the patient to receive SBRT so that he can continue to be  treated with cetuximab . - He will proceed with cetuximab  today.  Starting next week, I will increase the dose to 500 mg/m and change frequency to every 2 weeks. - RTC 3 weeks for follow-up.    2.  Oral pain: - Continue liquid hydrocodone  twice daily as needed.   3.  Cetuximab  skin rash: - Continue doxycycline  100 mg twice daily and clindamycin  gel twice daily.  No worsening of rash.   4.  Hypotension: - He will continue to hold antihypertensives.  Blood pressure today is 140/77.  5.  Hypomagnesemia: -He is taking Nexium  3 times daily.  Magnesium  is 1.4 today.  He will receive IV magnesium .  Will increase magnesium  to 2 tablets twice daily.    No orders of the defined types were placed in this encounter.      LILLETTE Verneta SAUNDERS [name],acting as a neurosurgeon for Alean Stands, MD.,have documented all relevant documentation on the behalf of Alean Stands, MD,as directed by  Alean Stands, MD while in the presence of Alean Stands, MD.  I, Alean Stands MD, have reviewed the above documentation for accuracy and completeness, and I agree with the above.      Alean Stands, MD   1/2/20253:17 PM  CHIEF COMPLAINT:   Diagnosis: squamous cell carcinoma of the floor of the mouth and testicular seminoma    Cancer Staging  Cancer of floor of mouth [facility]) Staging form: Oral Cavity, AJCC 8th Edition - Clinical stage from 11/25/2021: Stage IVC (cT4a, cN1, pM1) - Signed by Stands Alean, MD on 02/04/2022  Testicle cancer, right Staging form: Testis, AJCC 7th Edition - Clinical: Stage I (T2, N0, M0) - Signed by Berry Debby RAMAN, PA-C on 07/23/2013    Prior Therapy: 1. Pembrolizumab , 03/10/22 - 08/24/22 2. XRT to floor of mouth, 06/17/22 - 08/19/22  Current Therapy: Cetuximab  weekly   HISTORY OF PRESENT ILLNESS:   Oncology History  Testicle cancer, right  12/20/2012 Surgery   Right total orchiectomyt- 1. Testis, biopsy, right - SEMINOMA. PLEASE SEE  COMMENT. 2. Testis, tumor, right - SEMINOMA, 7.5 CM. - ANGIOLYMPHATIC INVASION PRESENT. - RESECTION MARGINS, NEGATIVE FOR ATYPIA OR MALIGNANCY.   01/18/2013 PET scan   Status post right orchiectomy. No findings specific for metastatic disease. Small para-aortic nodes measuring up to 5 mm short axis, without convincing hypermetabolism. Given location, attention on follow-up is suggested.   04/17/2013 Imaging   CT CAP- No evidence of metastatic disease in the chest, abdomen or pelvis. Proximal LAD coronary artery calcification.   07/20/2013 Imaging   CT abd/pelvis- No evidence of metastatic disease in the abdomen or pelvis.   10/24/2013 Imaging   CT abd/pelvis- No findings to suggest metastatic disease in the abdomen or pelvis   10/24/2013 Imaging   Chest xray- Probable COPD.  Negative for metastatic disease.   02/21/2014 Imaging   CT abd/pelvis- Stable abdominal pelvic CT status post right orchectomy. No evidence of adenopathy or other metastatic disease.   02/21/2014 Imaging   Chest xray- Left lower lobe mild atelectasis and/or infiltrate.     09/10/2014 Imaging   CT abd/pelvis- Status post right orchiectomy.   No evidence of metastatic disease.   3 mm nonobstructing  right upper pole renal calculus. No hydronephrosis.   03/14/2015 Imaging   CT abd/pelvis- Stable exam. No evidence of metastatic disease or other acute findings within the abdomen or pelvis.   09/13/2015 Imaging   CT abd/pelvis- No acute findings and no evidence for mass or adenopathy.    04/24/2016 Imaging   CT abd/pelvis: IMPRESSION: 1. Stable exam. No new or progressive findings. No features to suggest metastatic disease.   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HCC)  10/14/2021 Initial Diagnosis   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HCC)   11/25/2021 Cancer Staging   Staging form: Oral Cavity, AJCC 8th Edition - Clinical stage from 11/25/2021: Stage IVC (cT4a, cN1, pM1) - Signed by Rogers Hai, MD on 02/04/2022 Histopathologic type:  Squamous cell carcinoma, NOS Stage prefix: Initial diagnosis Histologic grade (G): G2 Histologic grading system: 3 grade system   03/10/2022 - 08/24/2022 Chemotherapy   Patient is on Treatment Plan : HEAD/NECK Pembrolizumab  (200) q21d     10/07/2022 -  Chemotherapy   Patient is on Treatment Plan : HEAD/NECK Cetuximab  q7d        INTERVAL HISTORY:   Gaelan is a 83 y.o. male presenting to clinic today for follow up of squamous cell carcinoma of the floor of the mouth and testicular seminoma. He was last seen by me on 12/31/22.  Since his last visit, he underwent MRI of the liver on 01/07/23 that found: right hepatic lobe lesion is increased in size from MRI 1 year prior with peripheral enhancement. The lesion peripherally hypermetabolic on most recent FDG PET scan. No new hepatic lesions present.  Today, he states that he is doing fair. His appetite level is at 75%. His energy level is at 50%. He is accompanied by his sister.  He is taking Magnesium  TID, as prescribed. He denies any dysphagia when taking magnesium  pills.   PAST MEDICAL HISTORY:   Past Medical History: Past Medical History:  Diagnosis Date   Arthritis    BPH (benign prostatic hyperplasia)    Difficult intubation    HOH (hard of hearing)    Hyperlipidemia 11/02/2017   Hypertension    Hypertension 11/02/2017   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HCC)    2014   Vitamin D deficiency 11/02/2017    Surgical History: Past Surgical History:  Procedure Laterality Date   COLONOSCOPY N/A 11/24/2012   Procedure: COLONOSCOPY;  Surgeon: Claudis RAYMOND Rivet, MD;  Location: [facility];  Service: Endoscopy;  Laterality: N/A;  830-moved to 730 Ann notified pt   FLOOR OF MOUTH BIOPSY N/A 10/14/2021   Procedure: FLOOR OF MOUTH RESECTION;  Surgeon: Carlie Clark, MD;  Location: [facility] OR;  Service: ENT;  Laterality: N/A;   HEMORROIDECTOMY     KNEE ARTHROSCOPY WITH LATERAL MENISECTOMY Right 08/31/2017   Procedure: KNEE ARTHROSCOPY WITH LATERAL  MENISECTOMY;  Surgeon: Margrette Taft BRAVO, MD;  Location: [facility] ORS;  Service: Orthopedics;  Laterality: Right;   LESION EXCISION N/A 03/23/2012   Procedure: EXCISION NEOPLASM SCALP ;  Surgeon: Oneil DELENA Budge, MD;  Location: [facility];  Service: General;  Laterality: N/A;  Excision of Scalp Neoplasm   ORCHIECTOMY Right 12/20/2012   Procedure: RIGHT RADICAL ORCHIECTOMY/POSSIBLE BX RIGHT TESTICLE;  Surgeon: Emery LILLETTE Blaze, MD;  Location: [facility];  Service: Urology;  Laterality: Right;   PORTACATH PLACEMENT Left 03/25/2022   Procedure: INSERTION PORT-A-CATH;  Surgeon: Budge Oneil, MD;  Location: [facility];  Service: General;  Laterality: Left;   PROSTATE SURGERY     RADICAL NECK DISSECTION Bilateral 10/14/2021  Procedure: NECK DISSECTION;  Surgeon: Carlie Clark, MD;  Location: [facility] OR;  Service: ENT;  Laterality: Bilateral;   SCALP LACERATION REPAIR     [facility]-Dr Claudene   SKIN FULL THICKNESS GRAFT Bilateral 10/14/2021   Procedure: PLATYSMA FLAP CLOSURE;  Surgeon: Carlie Clark, MD;  Location[facility] OR;  Service: ENT;  Laterality: Bilateral;   TOOTH EXTRACTION  10/14/2021   Procedure: DENTAL EXTRACTIONS;  Surgeon: Carlie Clark, MD;  Location: [facility] OR;  Service: ENT;;   TRACHEOSTOMY TUBE PLACEMENT N/A 10/14/2021   Procedure: TRACHEOSTOMY;  Surgeon: Carlie Clark, MD;  Location: [facility] OR;  Service: ENT;  Laterality: N/A;    Social History: Social History   Socioeconomic History   Marital status: Single    Spouse name: Not on file   Number of children: Not on file   Years of education: Not on file   Highest education level: Not on file  Occupational History   Not on file  Tobacco Use   Smoking status: Former    Current packs/day: 0.25    Average packs/day: 0.3 packs/day for 50.0 years (12.5 ttl pk-yrs)    Types: Cigarettes   Smokeless tobacco: Never  Vaping Use   Vaping status: Never Used  Substance and Sexual Activity   Alcohol  use: Not Currently   Drug use: No   Sexual activity: Yes    Birth  control/protection: None  Other Topics Concern   Not on file  Social History Narrative   Not on file   Social Drivers of Health   Financial Resource Strain: Not on file  Food Insecurity: Not on file  Transportation Needs: Not on file  Physical Activity: Not on file  Stress: Not on file  Social Connections: Not on file  Intimate Partner Violence: Not on file    Family History: Family History  Problem Relation Age of Onset   Cancer Brother    Cancer Brother     Current Medications:  Current Outpatient Medications:    acetaminophen  (TYLENOL ) 650 MG CR tablet, Take 650 mg by mouth every 8 (eight) hours as needed for pain., Disp: , Rfl:    amLODipine -olmesartan  (AZOR ) 5-20 MG tablet, TAKE 1 TABLET BY MOUTH DAILY, Disp: 90 tablet, Rfl: 0   budesonide -formoterol  (SYMBICORT ) 80-4.5 MCG/ACT inhaler, Inhale 2 puffs into the lungs in the morning and at bedtime., Disp: 1 each, Rfl: 12   Cholecalciferol (VITAMIN D3) 50 MCG (2000 UT) TABS, Take 2,000 Units by mouth daily., Disp: , Rfl:    ciprofloxacin-dexamethasone  (CIPRODEX) OTIC suspension, Place 4 drops into the left ear 2 (two) times daily., Disp: , Rfl:    clindamycin  (CLINDAGEL) 1 % gel, Apply topically 2 (two) times daily. Apply twice daily to chest rash, Disp: 30 g, Rfl: 0   doxycycline  (VIBRA -TABS) 100 MG tablet, Take 1 tablet (100 mg total) by mouth 2 (two) times daily., Disp: 60 tablet, Rfl: 3   HYDROcodone -acetaminophen  (HYCET) 7.5-325 mg/15 ml solution, Take 15 mLs by mouth every 6 (six) hours as needed for moderate pain (pain score 4-6)., Disp: 473 mL, Rfl: 0   lidocaine  (XYLOCAINE ) 2 % solution, Take by mouth., Disp: , Rfl:    Menthol, Topical Analgesic, (BIOFREEZE EX), Apply 1 application  topically daily as needed (pain)., Disp: , Rfl:    naloxone  (NARCAN ) nasal spray 4 mg/0.1 mL, SMARTSIG:Both Nares, Disp: , Rfl:    Polyethyl Glycol-Propyl Glycol (GOODSENSE LUBRICANT EYE DROPS OP), Place 1 drop into both eyes daily as  needed (dry eyes)., Disp: , Rfl:  pravastatin  (PRAVACHOL ) 80 MG tablet, Take 80 mg by mouth daily., Disp: , Rfl:    predniSONE  (DELTASONE ) 10 MG tablet, Take  4 each am x 2 days,   2 each am x 2 days,  1 each am x 2 days and stop, Disp: 14 tablet, Rfl: 0   RAPAFLO 8 MG CAPS capsule, Take 8 mg by mouth daily. Silodosin, Disp: , Rfl:    vitamin B-12 (CYANOCOBALAMIN ) 500 MCG tablet, Take 500 mcg by mouth daily., Disp: , Rfl:    magnesium  oxide (MAG-OX) 400 MG tablet, Take 2 tablets (800 mg total) by mouth 2 (two) times daily., Disp: 120 tablet, Rfl: 3 No current facility-administered medications for this visit.  Facility-Administered Medications Ordered in Other Visits:    sodium chloride  flush (NS) 0.9 % injection 10 mL, 10 mL, Intracatheter, PRN, Tanicka Bisaillon, MD, 10 mL at 01/14/23 1238   Allergies: No Known Allergies  REVIEW OF SYSTEMS:   Review of Systems  Constitutional:  Negative for chills, fatigue and fever.  HENT:   Negative for lump/mass, mouth sores, nosebleeds, sore throat and trouble swallowing.   Eyes:  Negative for eye problems.  Respiratory:  Negative for cough and shortness of breath.   Cardiovascular:  Negative for chest pain, leg swelling and palpitations.  Gastrointestinal:  Negative for abdominal pain, constipation, diarrhea, nausea and vomiting.  Genitourinary:  Negative for bladder incontinence, difficulty urinating, dysuria, frequency, hematuria and nocturia.   Musculoskeletal:  Negative for arthralgias, back pain, flank pain, myalgias and neck pain.  Skin:  Negative for itching and rash.  Neurological:  Negative for dizziness, headaches and numbness.  Hematological:  Does not bruise/bleed easily.  Psychiatric/Behavioral:  Negative for depression, sleep disturbance and suicidal ideas. The patient is not nervous/anxious.   All other systems reviewed and are negative.    VITALS:   Weight 154 lb 1.6 oz (69.9 kg).  Wt Readings from Last 3 Encounters:   01/14/23 154 lb 1.6 oz (69.9 kg)  12/31/22 157 lb (71.2 kg)  12/24/22 155 lb 1.6 oz (70.4 kg)    Body mass index is 21.49 kg/m.  Performance status (ECOG): 1 - Symptomatic but completely ambulatory  PHYSICAL EXAM:   Physical Exam Vitals and nursing note reviewed. Exam conducted with a chaperone present.  Constitutional:      Appearance: Normal appearance.  Cardiovascular:     Rate and Rhythm: Normal rate and regular rhythm.     Pulses: Normal pulses.     Heart sounds: Normal heart sounds.  Pulmonary:     Effort: Pulmonary effort is normal.     Breath sounds: Normal breath sounds.  Abdominal:     Palpations: Abdomen is soft. There is no hepatomegaly, splenomegaly or mass.     Tenderness: There is no abdominal tenderness.  Musculoskeletal:     Right lower leg: No edema.     Left lower leg: No edema.  Lymphadenopathy:     Cervical: No cervical adenopathy.     Right cervical: No superficial, deep or posterior cervical adenopathy.    Left cervical: No superficial, deep or posterior cervical adenopathy.     Upper Body:     Right upper body: No supraclavicular or axillary adenopathy.     Left upper body: No supraclavicular or axillary adenopathy.  Neurological:     General: No focal deficit present.     Mental Status: He is alert and oriented to person, place, and time.  Psychiatric:        Mood and  Affect: Mood normal.        Behavior: Behavior normal.     LABS:      Latest Ref Rng & Units 01/14/2023    8:37 AM 12/31/2022    9:10 AM 12/24/2022    8:56 AM  CBC  WBC 4.0 - 10.5 K/uL 6.2  6.8  6.5   Hemoglobin 13.0 - 17.0 g/dL 87.6  88.0  87.6   Hematocrit 39.0 - 52.0 % 37.9  37.0  37.8   Platelets 150 - 400 K/uL 263  238  255       Latest Ref Rng & Units 01/14/2023    8:37 AM 12/31/2022    9:10 AM 12/24/2022    8:56 AM  CMP  Glucose 70 - 99 mg/dL 892  876  895   BUN 8 - 23 mg/dL 10  9  13    Creatinine 0.61 - 1.24 mg/dL 9.21  9.34  9.21   Sodium 135 - 145 mmol/L  137  137  136   Potassium 3.5 - 5.1 mmol/L 3.9  3.8  4.2   Chloride 98 - 111 mmol/L 102  104  104   CO2 22 - 32 mmol/L 26  26  25    Calcium 8.9 - 10.3 mg/dL 89.7  9.7  9.7   Total Protein 6.5 - 8.1 g/dL 7.2  6.4  6.7   Total Bilirubin 0.0 - 1.2 mg/dL 0.8  0.6  0.6   Alkaline Phos 38 - 126 U/L 45  48  47   AST 15 - 41 U/L 19  19  18    ALT 0 - 44 U/L 14  17  15       Lab Results  Component Value Date   CEA 2.2 01/19/2013   /  CEA  Date Value Ref Range Status  01/19/2013 2.2 0.0 - 5.0 ng/mL Final    Comment:    Performed at [facility]   No results found for: PSA1 No results found for: RJW800 No results found for: CAN125  No results found for: STEPHANY RINGS, A1GS, A2GS, BETS, BETA2SER, GAMS, MSPIKE, SPEI Lab Results  Component Value Date   TIBC 244 (L) 08/04/2022   TIBC 251 05/12/2022   FERRITIN 273 08/04/2022   FERRITIN 195 05/12/2022   IRONPCTSAT 19 08/04/2022   IRONPCTSAT 18 05/12/2022   Lab Results  Component Value Date   LDH 172 11/12/2020   LDH 175 10/26/2018   LDH 179 10/25/2013     STUDIES:   MR LIVER W WO CONTRAST Result Date: 01/14/2023 CLINICAL DATA:  Head neck carcinoma.  Evaluate liver metastasis. EXAM: MRI ABDOMEN WITHOUT AND WITH CONTRAST TECHNIQUE: Multiplanar multisequence MR imaging of the abdomen was performed both before and after the administration of intravenous contrast. CONTRAST:  7mL GADAVIST  GADOBUTROL  1 MMOL/ML IV SOLN COMPARISON:  PET-CT 12/24/2022, abdominal MRI 01/02/2022. FINDINGS: Lower chest:  Lung bases are clear. Hepatobiliary: RIGHT hepatic lobe lesion (segment 7/8) is increased in size from comparison MRI. Lesion measures 4.8 by 3.3 cm compared to 2.7 x 2.7 cm on MRI 01/02/2022. Lesion was peripherally hypermetabolic on most recent FDG PET scan. Lesion has peripheral enhancement insert central hypoenhancement to suggest necrosis (image 13/series 14. No new lesions in the liver. Gallbladder  biliary tree normal. Pancreas: Normal pancreatic parenchymal intensity. No ductal dilatation or inflammation. Spleen: Normal spleen. Adrenals/urinary tract: Adrenal glands and kidneys are normal. Bilateral nonenhancing renal cysts. No follow-up recommended for benign renal lesion. Stomach/Bowel: Stomach and limited of the small  bowel is unremarkable Vascular/Lymphatic: Abdominal aortic normal caliber. No retroperitoneal periportal lymphadenopathy. Musculoskeletal: No aggressive osseous lesion IMPRESSION: 1. RIGHT hepatic lobe lesion is increased in size from MRI 1 year prior. Lesion has peripheral enhancement. Lesion peripherally hypermetabolic on most recent FDG PET scan. 2. No new hepatic lesions present. Electronically Signed   By: Jackquline Boxer M.D.   On: 01/14/2023 09:39   NM PET Image Restage (PS) Skull Base to Thigh (F-18 FDG) Result Date: 12/30/2022 CLINICAL DATA:  Subsequent treatment strategy for floor of mouth cancer completed radiation 1 month prior and chemotherapy 2 weeks prior. EXAM: NUCLEAR MEDICINE PET SKULL BASE TO THIGH TECHNIQUE: 7.8 mCi F-18 FDG was injected intravenously. Full-ring PET imaging was performed from the skull base to thigh after the radiotracer. CT data was obtained and used for attenuation correction and anatomic localization. Fasting blood glucose: 113 mg/dl COMPARISON:  91/70/7975 PET-CT. FINDINGS: Mediastinal blood pool activity: SUV max 1.6 Liver activity: SUV max NA NECK: Hypermetabolic solid 4.2 x 2.8 cm anterior floor of mouth mass with max SUV 10.4 (series 202/image 23), previously 4.2 x 2.7 cm with max SUV 14.8, stable size and mildly decreased in metabolism. Stable associated lytic erosion of the anterior midline mandible. Hypermetabolic level Ib right neck 1.1 cm node with max SUV 7.5 (series 202/image 28), previously 1.1 cm with max SUV 6.9, not substantially changed. Hypermetabolic right level IV neck 1.3 cm node with max SUV 9.4 (series 202/image 39),  previously 1.1 cm with max SUV 6.8, mildly increased in size and metabolism. No additional enlarged or hypermetabolic neck nodes. Incidental CT findings: None. CHEST: No enlarged or hypermetabolic axillary, mediastinal or hilar lymph nodes. No hypermetabolic pulmonary findings. Incidental CT findings: Left subclavian Port-A-Cath terminates in the upper third of the SVC. Coronary atherosclerosis. Atherosclerotic nonaneurysmal thoracic aorta. Mild centrilobular emphysema. Tiny solid 0.2 cm peripheral right middle lobe pulmonary nodule on series 203/image 33), unchanged and below PET resolution. ABDOMEN/PELVIS: Hypermetabolic 4.6 cm right liver dome mass with max SUV 8.9 (series 202/image 78), previously 3.8 cm with max SUV 3.8, increased in size and metabolism. No additional hypermetabolic liver masses. No abnormal hypermetabolic activity within the pancreas, adrenal glands, or spleen. No hypermetabolic lymph nodes in the abdomen or pelvis. Incidental CT findings: Nonobstructing 3 mm lower right renal stone. Simple small bilateral renal cysts, largest 1.3 cm in the lateral upper right kidney, for which no follow-up imaging is recommended. Atherosclerotic nonaneurysmal abdominal aorta. Moderate prostatomegaly. SKELETON: No additional foci of hypermetabolic activity to suggest skeletal metastasis. Incidental CT findings: None. IMPRESSION: 1. Hypermetabolic anterior floor of mouth mass, stable in size and mildly decreased in metabolism. Stable associated lytic erosion of the anterior midline mandible. 2. Stable hypermetabolic right level Ib nodal metastasis. Right level IV neck nodal metastasis is mildly increased in size and metabolism. 3. Hypermetabolic 4.6 cm right liver dome metastasis, increased in size and metabolism. 4. No new sites of hypermetabolic metastatic disease. 5. Aortic Atherosclerosis (ICD10-I70.0) and Emphysema (ICD10-J43.9). Electronically Signed   By: Selinda DELENA Blue M.D.   On: 12/30/2022 16:34   MR  Brain W Wo Contrast Result Date: 12/30/2022 CLINICAL DATA:  Metastatic disease evaluation, staging EXAM: MRI HEAD WITHOUT AND WITH CONTRAST TECHNIQUE: Multiplanar, multiecho pulse sequences of the brain and surrounding structures were obtained without and with intravenous contrast. CONTRAST:  7mL GADAVIST  GADOBUTROL  1 MMOL/ML IV SOLN COMPARISON:  No prior MRI available FINDINGS: Brain: No restricted diffusion to suggest acute or subacute infarct. No abnormal parenchymal or meningeal enhancement. No  acute hemorrhage, mass, mass effect, or midline shift. No hydrocephalus or extra-axial collection. Pituitary and craniocervical junction within normal limits. 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 No hemosiderin deposition to suggest remote hemorrhage. Advanced cerebral atrophy for age, most prominent in the parietal lobes. 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 Vascular: Normal arterial flow voids. Normal arterial and venous enhancement. Skull and upper cervical spine: Normal marrow signal. Sinuses/Orbits: Mucosal thickening in the left frontal sinus and anterior ethmoid air cells. No acute finding in the orbits. Status post bilateral lens replacements. Other: Fluid in the right-greater-than-left mastoid air cells. IMPRESSION: No acute intracranial process. No evidence of intracranial metastatic disease. Electronically Signed   By: Donald Campion M.D.   On: 12/30/2022 15:55

## 2023-01-14 ENCOUNTER — Inpatient Hospital Stay (HOSPITAL_BASED_OUTPATIENT_CLINIC_OR_DEPARTMENT_OTHER): Payer: 59 | Admitting: Hematology

## 2023-01-14 ENCOUNTER — Inpatient Hospital Stay: Payer: 59

## 2023-01-14 ENCOUNTER — Inpatient Hospital Stay: Payer: 59 | Attending: Hematology

## 2023-01-14 VITALS — BP 148/67 | HR 85 | Temp 97.1°F | Resp 18

## 2023-01-14 VITALS — Wt 154.1 lb

## 2023-01-14 DIAGNOSIS — Z8547 Personal history of malignant neoplasm of testis: Secondary | ICD-10-CM | POA: Diagnosis not present

## 2023-01-14 DIAGNOSIS — C787 Secondary malignant neoplasm of liver and intrahepatic bile duct: Secondary | ICD-10-CM | POA: Insufficient documentation

## 2023-01-14 DIAGNOSIS — I959 Hypotension, unspecified: Secondary | ICD-10-CM | POA: Insufficient documentation

## 2023-01-14 DIAGNOSIS — C049 Malignant neoplasm of floor of mouth, unspecified: Secondary | ICD-10-CM

## 2023-01-14 DIAGNOSIS — C01 Malignant neoplasm of base of tongue: Secondary | ICD-10-CM

## 2023-01-14 DIAGNOSIS — Z5111 Encounter for antineoplastic chemotherapy: Secondary | ICD-10-CM | POA: Diagnosis not present

## 2023-01-14 DIAGNOSIS — R21 Rash and other nonspecific skin eruption: Secondary | ICD-10-CM | POA: Insufficient documentation

## 2023-01-14 DIAGNOSIS — Z87891 Personal history of nicotine dependence: Secondary | ICD-10-CM | POA: Insufficient documentation

## 2023-01-14 LAB — COMPREHENSIVE METABOLIC PANEL
ALT: 14 U/L (ref 0–44)
AST: 19 U/L (ref 15–41)
Albumin: 3.4 g/dL — ABNORMAL LOW (ref 3.5–5.0)
Alkaline Phosphatase: 45 U/L (ref 38–126)
Anion gap: 9 (ref 5–15)
BUN: 10 mg/dL (ref 8–23)
CO2: 26 mmol/L (ref 22–32)
Calcium: 10.2 mg/dL (ref 8.9–10.3)
Chloride: 102 mmol/L (ref 98–111)
Creatinine, Ser: 0.78 mg/dL (ref 0.61–1.24)
GFR, Estimated: >60 mL/min (ref >60)
Glucose, Bld: 107 mg/dL — ABNORMAL HIGH (ref 70–99)
Potassium: 3.9 mmol/L (ref 3.5–5.1)
Sodium: 137 mmol/L (ref 135–145)
Total Bilirubin: 0.8 mg/dL (ref 0.0–1.2)
Total Protein: 7.2 g/dL (ref 6.5–8.1)

## 2023-01-14 LAB — CBC WITH DIFFERENTIAL/PLATELET
Abs Immature Granulocytes: 0.01 10*3/uL (ref 0.00–0.07)
Basophils Absolute: 0.1 10*3/uL (ref 0.0–0.1)
Basophils Relative: 1 %
Eosinophils Absolute: 0.3 10*3/uL (ref 0.0–0.5)
Eosinophils Relative: 5 %
HCT: 37.9 % — ABNORMAL LOW (ref 39.0–52.0)
Hemoglobin: 12.3 g/dL — ABNORMAL LOW (ref 13.0–17.0)
Immature Granulocytes: 0 %
Lymphocytes Relative: 23 %
Lymphs Abs: 1.4 10*3/uL (ref 0.7–4.0)
MCH: 32.8 pg (ref 26.0–34.0)
MCHC: 32.5 g/dL (ref 30.0–36.0)
MCV: 101.1 fL — ABNORMAL HIGH (ref 80.0–100.0)
Monocytes Absolute: 0.7 10*3/uL (ref 0.1–1.0)
Monocytes Relative: 11 %
Neutro Abs: 3.7 10*3/uL (ref 1.7–7.7)
Neutrophils Relative %: 60 %
Platelets: 263 10*3/uL (ref 150–400)
RBC: 3.75 MIL/uL — ABNORMAL LOW (ref 4.22–5.81)
RDW: 13.2 % (ref 11.5–15.5)
WBC: 6.2 10*3/uL (ref 4.0–10.5)
nRBC: 0 % (ref 0.0–0.2)

## 2023-01-14 LAB — MAGNESIUM: Magnesium: 1.4 mg/dL — ABNORMAL LOW (ref 1.7–2.4)

## 2023-01-14 MED ORDER — MAGNESIUM SULFATE 4 GM/100ML IV SOLN
4.0000 g | Freq: Once | INTRAVENOUS | Status: AC
  Administered 2023-01-14: 4 g via INTRAVENOUS
  Filled 2023-01-14: qty 100

## 2023-01-14 MED ORDER — SODIUM CHLORIDE 0.9% FLUSH
10.0000 mL | INTRAVENOUS | Status: DC | PRN
  Administered 2023-01-14: 10 mL

## 2023-01-14 MED ORDER — HEPARIN SOD (PORK) LOCK FLUSH 100 UNIT/ML IV SOLN
500.0000 [IU] | Freq: Once | INTRAVENOUS | Status: AC | PRN
  Administered 2023-01-14: 500 [IU]

## 2023-01-14 MED ORDER — SODIUM CHLORIDE 0.9% FLUSH
10.0000 mL | Freq: Once | INTRAVENOUS | Status: AC
Start: 2023-01-14 — End: 2023-01-14
  Administered 2023-01-14: 10 mL via INTRAVENOUS

## 2023-01-14 MED ORDER — METHYLPREDNISOLONE SODIUM SUCC 125 MG IJ SOLR
125.0000 mg | Freq: Once | INTRAMUSCULAR | Status: AC
Start: 2023-01-14 — End: 2023-01-14
  Administered 2023-01-14: 125 mg via INTRAVENOUS
  Filled 2023-01-14: qty 2

## 2023-01-14 MED ORDER — MAGNESIUM OXIDE 400 MG PO TABS: 800.0000 mg | ORAL_TABLET | Freq: Two times a day (BID) | ORAL | 3 refills | Status: DC

## 2023-01-14 MED ORDER — DIPHENHYDRAMINE HCL 50 MG/ML IJ SOLN
50.0000 mg | Freq: Once | INTRAMUSCULAR | Status: AC
  Administered 2023-01-14: 50 mg via INTRAVENOUS
  Filled 2023-01-14: qty 1

## 2023-01-14 MED ORDER — CETUXIMAB CHEMO IV INJECTION 200 MG/100ML
250.0000 mg/m2 | Freq: Once | INTRAVENOUS | Status: AC
Start: 2023-01-14 — End: 2023-01-14
  Administered 2023-01-14: 500 mg via INTRAVENOUS
  Filled 2023-01-14: qty 200

## 2023-01-14 MED ORDER — FAMOTIDINE IN NACL 20-0.9 MG/50ML-% IV SOLN
20.0000 mg | Freq: Once | INTRAVENOUS | Status: AC
  Administered 2023-01-14: 20 mg via INTRAVENOUS
  Filled 2023-01-14: qty 50

## 2023-01-14 MED ORDER — SODIUM CHLORIDE 0.9 % IV SOLN: Freq: Once | INTRAVENOUS | Status: AC

## 2023-01-14 NOTE — Progress Notes (Signed)
 Patient okay for treatment today per Dr. Rogers with additional order for 4g of magnesium . Patient tolerated chemotherapy with no complaints voiced. Side effects with management reviewed understanding verbalized. Port site clean and dry with no bruising or swelling noted at site. Good blood return noted before and after administration of chemotherapy. Band aid applied. Patient left in satisfactory condition with VSS and no s/s of distress noted.

## 2023-01-14 NOTE — Patient Instructions (Signed)
 [facility] - A DEPT OF [facility]  Discharge Instructions: Thank you for choosing [facility] to provide your oncology and hematology care.  If you have a lab appointment with the Cancer Center - please note that after April 8th, 2024, all labs will be drawn in the cancer center.  You do not have to check in or register with the main entrance as you have in the past but will complete your check-in in the cancer center.  Wear comfortable clothing and clothing appropriate for easy access to any Portacath or PICC line.   We strive to give you quality time with your provider. You may need to reschedule your appointment if you arrive late (15 or more minutes).  Arriving late affects you and other patients whose appointments are after yours.  Also, if you miss three or more appointments without notifying the office, you may be dismissed from the clinic at the provider's discretion.      For prescription refill requests, have your pharmacy contact our office and allow 72 hours for refills to be completed.    Today you received the following chemotherapy and/or immunotherapy agents Erbitux , return as scheduled.   To help prevent nausea and vomiting after your treatment, we encourage you to take your nausea medication as directed.  BELOW ARE SYMPTOMS THAT SHOULD BE REPORTED IMMEDIATELY: *FEVER GREATER THAN 100.4 F (38 C) OR HIGHER *CHILLS OR SWEATING *NAUSEA AND VOMITING THAT IS NOT CONTROLLED WITH YOUR NAUSEA MEDICATION *UNUSUAL SHORTNESS OF BREATH *UNUSUAL BRUISING OR BLEEDING *URINARY PROBLEMS (pain or burning when urinating, or frequent urination) *BOWEL PROBLEMS (unusual diarrhea, constipation, pain near the anus) TENDERNESS IN MOUTH AND THROAT WITH OR WITHOUT PRESENCE OF ULCERS (sore throat, sores in mouth, or a toothache) UNUSUAL RASH, SWELLING OR PAIN  UNUSUAL VAGINAL DISCHARGE OR ITCHING   Items with * indicate a potential emergency and  should be followed up as soon as possible or go to the Emergency Department if any problems should occur.  Please show the CHEMOTHERAPY ALERT CARD or IMMUNOTHERAPY ALERT CARD at check-in to the Emergency Department and triage nurse.  Should you have questions after your visit or need to cancel or reschedule your appointment, please contact [facility] [place] - A DEPT OF [facility] [phone]  and follow the prompts.  Office hours are 8:00 a.m. to 4:30 p.m. Monday - Friday. Please note that voicemails left after 4:00 p.m. may not be returned until the following business day.  We are closed weekends and major holidays. You have access to a nurse at all times for urgent questions. Please call the main number to the clinic [phone] and follow the prompts.  For any non-urgent questions, you may also contact your provider using MyChart. We now offer e-Visits for anyone 75 and older to request care online for non-urgent symptoms. For details visit [email or web address].   Also download the MyChart app! Go to the app store, search MyChart, open the app, select [place], and log in with your MyChart username and password.

## 2023-01-14 NOTE — Progress Notes (Signed)
 Next cycle change Erbitux dose to 500 mg/m2 q 14 days.  V.O. Dr Carilyn Goodpasture, PharmD

## 2023-01-14 NOTE — Patient Instructions (Signed)
 [facility] at [facility] Discharge Instructions   You were seen and examined today by Dr. Rogers.  He reviewed the results of your lab work which are mostly normal/stable. Your magnesium  is low at 1.4 today. We will give you IV magnesium  in the clinic today. Dr. MARLA wants you to increase the magnesium  pills to 2 pills twice a day.   We will proceed with your treatment today.   Return as scheduled.    Thank you for choosing [facility] at [facility] to provide your oncology and hematology care.  To afford each patient quality time with our provider, please arrive at least 15 minutes before your scheduled appointment time.   If you have a lab appointment with the Cancer Center please come in thru the Main Entrance and check in at the main information desk.  You need to re-schedule your appointment should you arrive 10 or more minutes late.  We strive to give you quality time with our providers, and arriving late affects you and other patients whose appointments are after yours.  Also, if you no show three or more times for appointments you may be dismissed from the clinic at the providers discretion.     Again, thank you for choosing [facility].  Our hope is that these requests will decrease the amount of time that you wait before being seen by our physicians.       _____________________________________________________________  Should you have questions after your visit to [facility], please contact our office at [phone] and follow the prompts.  Our office hours are 8:00 a.m. and 4:30 p.m. Monday - Friday.  Please note that voicemails left after 4:00 p.m. may not be returned until the following business day.  We are closed weekends and major holidays.  You do have access to a nurse 24-7, just call the main number to the clinic [phone] and do not press any options, hold on the line and a nurse will answer the  phone.    For prescription refill requests, have your pharmacy contact our office and allow 72 hours.    Due to Covid, you will need to wear a mask upon entering the hospital. If you do not have a mask, a mask will be given to you at the Main Entrance upon arrival. For doctor visits, patients may have 1 support person age 42 or older with them. For treatment visits, patients can not have anyone with them due to social distancing guidelines and our immunocompromised population.

## 2023-01-19 NOTE — Progress Notes (Signed)
 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 Rogers 01/13/2022 - MRI liver (01/07/2023): Right hepatic liver lesion measures 4.8 x 3.3 cm.  No new hepatic lesions seen. - I reached out to Dr. Dewey at [facility].  I was told that it is feasible for the patient to receive SBRT so that he can continue to be treated with cetuximab .   Weight changes, if any: No  Bowel/Bladder complaints, if any: No  Nausea / Vomiting, if any: No  Pain issues, if any:  Denies abdominal pain.  Appetite: Still on pureed foods.  Drinking fair.  Taking in PO.    SAFETY ISSUES: Prior radiation? Floor of the mouth 06/29/2022 in [place], 4 treatments (2 days BID). Pacemaker/ICD? No Possible current pregnancy? N/a Is the patient on methotrexate? No  Current Complaints/Details:

## 2023-01-20 ENCOUNTER — Ambulatory Visit
Admission: RE | Admit: 2023-01-20 | Discharge: 2023-01-20 | Disposition: A | Discharge status: Home / Self Care | Payer: 59 | Source: Ambulatory Visit | Attending: Radiation Oncology | Admitting: Radiation Oncology

## 2023-01-20 ENCOUNTER — Ambulatory Visit: Payer: 59 | Admitting: Radiation Oncology

## 2023-01-20 ENCOUNTER — Encounter: Payer: Self-pay | Admitting: Radiation Oncology

## 2023-01-20 ENCOUNTER — Other Ambulatory Visit: Payer: Self-pay

## 2023-01-20 ENCOUNTER — Ambulatory Visit: Payer: 59

## 2023-01-20 VITALS — BP 149/90 | HR 98 | Resp 18 | Wt 157.1 lb

## 2023-01-20 VITALS — BP 149/80 | HR 98 | Resp 18 | Ht 71.0 in | Wt 157.2 lb

## 2023-01-20 DIAGNOSIS — C770 Secondary and unspecified malignant neoplasm of lymph nodes of head, face and neck: Secondary | ICD-10-CM | POA: Diagnosis not present

## 2023-01-20 DIAGNOSIS — Z87891 Personal history of nicotine dependence: Secondary | ICD-10-CM | POA: Insufficient documentation

## 2023-01-20 DIAGNOSIS — C01 Malignant neoplasm of base of tongue: Secondary | ICD-10-CM | POA: Insufficient documentation

## 2023-01-20 DIAGNOSIS — Z79899 Other long term (current) drug therapy: Secondary | ICD-10-CM | POA: Diagnosis not present

## 2023-01-20 DIAGNOSIS — M129 Arthropathy, unspecified: Secondary | ICD-10-CM | POA: Diagnosis not present

## 2023-01-20 DIAGNOSIS — R911 Solitary pulmonary nodule: Secondary | ICD-10-CM | POA: Insufficient documentation

## 2023-01-20 DIAGNOSIS — N400 Enlarged prostate without lower urinary tract symptoms: Secondary | ICD-10-CM | POA: Diagnosis not present

## 2023-01-20 DIAGNOSIS — I700 Atherosclerosis of aorta: Secondary | ICD-10-CM | POA: Insufficient documentation

## 2023-01-20 DIAGNOSIS — C787 Secondary malignant neoplasm of liver and intrahepatic bile duct: Secondary | ICD-10-CM | POA: Insufficient documentation

## 2023-01-20 DIAGNOSIS — J432 Centrilobular emphysema: Secondary | ICD-10-CM | POA: Diagnosis not present

## 2023-01-20 DIAGNOSIS — I10 Essential (primary) hypertension: Secondary | ICD-10-CM | POA: Insufficient documentation

## 2023-01-20 DIAGNOSIS — N281 Cyst of kidney, acquired: Secondary | ICD-10-CM | POA: Insufficient documentation

## 2023-01-20 DIAGNOSIS — I2510 Atherosclerotic heart disease of native coronary artery without angina pectoris: Secondary | ICD-10-CM | POA: Diagnosis not present

## 2023-01-20 DIAGNOSIS — N200 Calculus of kidney: Secondary | ICD-10-CM | POA: Insufficient documentation

## 2023-01-20 DIAGNOSIS — E785 Hyperlipidemia, unspecified: Secondary | ICD-10-CM | POA: Diagnosis not present

## 2023-01-20 DIAGNOSIS — E559 Vitamin D deficiency, unspecified: Secondary | ICD-10-CM | POA: Diagnosis not present

## 2023-01-20 DIAGNOSIS — Z923 Personal history of irradiation: Secondary | ICD-10-CM | POA: Insufficient documentation

## 2023-01-20 DIAGNOSIS — Z7951 Long term (current) use of inhaled steroids: Secondary | ICD-10-CM | POA: Diagnosis not present

## 2023-01-20 DIAGNOSIS — Z8547 Personal history of malignant neoplasm of testis: Secondary | ICD-10-CM | POA: Insufficient documentation

## 2023-01-20 NOTE — Progress Notes (Addendum)
 Radiation Oncology         [phone] ________________________________  Name: Nathaniel Hicks        MRN: [id number]  Date of Service: 01/20/2023 DOB: 1940/10/29  RR:Yjoo, Norleen PEDLAR, MD  Rogers Hai, MD     REFERRING PHYSICIAN: Rogers Hai, MD   DIAGNOSIS: The primary encounter diagnosis was Metastases to the liver [facility]). A diagnosis of Cancer of base of tongue (HCC) was also pertinent to this visit.   HISTORY OF PRESENT ILLNESS: Nathaniel Hicks is a 83 y.o. male seen at the request of Dr. Katragadda for a diagnosis of cancer involving the base of tongue. 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 On 10/14/21 he underwent surgical resection with tracheostomy bilateral selective neck dissection with Dr. Carlie and he had what was felt to be a T4 tumor with involvement of the level 1 neck nodes.  The resection margins were negative, and he did well until he was found to have a local paratracheal node that was hypermetabolic as well as a hypermetabolic new mass in the liver on PET on 12/12/21. e underwent a biopsy of the liver lesion on 01/28/2022 showing metastatic carcinoma consistent with his squamous cell primary.  Locally it was felt that he also had concerns for disease in the floor of the mouth.  He began systemic Keytruda  from 03/10/2022 through 08/24/2022 when it was found that follow-up PET scan showed progression in his liver disease measuring up to 3.1 cm.  He had mild progressive right cervical adenopathy, and he was started on weekly cetuximab  on 10/07/2022.  Restaging PET on 12/24/2022 showed stability in the known disease in the floor of the  mouth with a mild decrease in the metabolic activity, stable hypermetabolic level 1 nodal disease but progressive change in the right liver lesion measuring up to 4.6 cm with an increase in metabolic activity as well.  No new disease was noted.  An MRI brain on 12/25/2022 did not show evidence of intracranial disease and an MRI of the liver on 01/07/2023 showed that the lesion was solitary but measured 4.8 x 3.3 cm.  It was not felt that this would be amenable to ablative techniques for localized treatment to the liver with interventional radiology so he is seen today to discuss stereotactic body radiotherapy instead.  Of note his plans with Dr. Katragadda are to increase the dose of his cetuximab  and administer treatment every 2 weeks.    PREVIOUS RADIATION THERAPY:   The patient has been treated what appears to be twice with Quadshot style radiotherapy to the oropharynx. Details unavailable at the time of dictaction, though the patient was treated in [place] with Dr. Dannielle.   PAST MEDICAL HISTORY:  Past Medical History:  Diagnosis Date   Arthritis    BPH (benign prostatic hyperplasia)    Difficult intubation    HOH (hard of hearing)    Hyperlipidemia 11/02/2017   Hypertension    Hypertension 11/02/2017   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HCC)    2014   Vitamin D deficiency 11/02/2017       PAST SURGICAL HISTORY: Past Surgical History:  Procedure Laterality Date   COLONOSCOPY N/A 11/24/2012  Procedure: COLONOSCOPY;  Surgeon: Claudis RAYMOND Rivet, MD;  Location: [facility];  Service: Endoscopy;  Laterality: N/A;  830-moved to 730 Ann notified pt   FLOOR OF MOUTH BIOPSY N/A 10/14/2021   Procedure: FLOOR OF MOUTH RESECTION;  Surgeon: Carlie Clark, MD;  Location: [facility] OR;  Service: ENT;  Laterality: N/A;   HEMORROIDECTOMY     KNEE ARTHROSCOPY WITH LATERAL MENISECTOMY Right 08/31/2017   Procedure: KNEE ARTHROSCOPY WITH LATERAL MENISECTOMY;  Surgeon: Margrette Taft BRAVO, MD;  Location: [facility]S;  Service:  Orthopedics;  Laterality: Right;   LESION EXCISION N/A 03/23/2012   Procedure: EXCISION NEOPLASM SCALP ;  Surgeon: Oneil DELENA Budge, MD;  Location: [facility];  Service: General;  Laterality: N/A;  Excision of Scalp Neoplasm   ORCHIECTOMY Right 12/20/2012   Procedure: RIGHT RADICAL ORCHIECTOMY/POSSIBLE BX RIGHT TESTICLE;  Surgeon: Emery LILLETTE Blaze, MD;  Location: [facility];  Service: Urology;  Laterality: Right;   PORTACATH PLACEMENT Left 03/25/2022   Procedure: INSERTION PORT-A-CATH;  Surgeon: Budge Oneil, MD;  Location: [facility];  Service: General;  Laterality: Left;   PROSTATE SURGERY     RADICAL NECK DISSECTION Bilateral 10/14/2021   Procedure: NECK DISSECTION;  Surgeon: Carlie Clark, MD;  Location: [facility] OR;  Service: ENT;  Laterality: Bilateral;   SCALP LACERATION REPAIR     [facility]-Dr Claudene   SKIN FULL THICKNESS GRAFT Bilateral 10/14/2021   Procedure: PLATYSMA FLAP CLOSURE;  Surgeon: Carlie Clark, MD;  Location: [facility] OR;  Service: ENT;  Laterality: Bilateral;   TOOTH EXTRACTION  10/14/2021   Procedure: DENTAL EXTRACTIONS;  Surgeon: Carlie Clark, MD;  Location: [facility] OR;  Service: ENT;;   TRACHEOSTOMY TUBE PLACEMENT N/A 10/14/2021   Procedure: TRACHEOSTOMY;  Surgeon: Carlie Clark, MD;  Location: [facility] OR;  Service: ENT;  Laterality: N/A;     FAMILY HISTORY:  Family History  Problem Relation Age of Onset   Cancer Brother    Cancer Brother      SOCIAL HISTORY:  reports that he has quit smoking. His smoking use included cigarettes. He has a 12.5 pack-year smoking history. He has never used smokeless tobacco. He reports that he does not currently use alcohol . He reports that he does not use drugs.   ALLERGIES: Patient has no known allergies.   MEDICATIONS:  Current Outpatient Medications  Medication Sig Dispense Refill   acetaminophen  (TYLENOL ) 650 MG CR tablet Take 650 mg by mouth every 8 (eight) hours as needed for pain.     amLODipine -olmesartan  (AZOR ) 5-20 MG tablet TAKE 1 TABLET BY MOUTH  DAILY 90 tablet 0   budesonide -formoterol  (SYMBICORT ) 80-4.5 MCG/ACT inhaler Inhale 2 puffs into the lungs in the morning and at bedtime. 1 each 12   Cholecalciferol (VITAMIN D3) 50 MCG (2000 UT) TABS Take 2,000 Units by mouth daily.     ciprofloxacin-dexamethasone  (CIPRODEX) OTIC suspension Place 4 drops into the left ear 2 (two) times daily.     clindamycin  (CLINDAGEL) 1 % gel Apply topically 2 (two) times daily. Apply twice daily to chest rash 30 g 0   doxycycline  (VIBRA -TABS) 100 MG tablet Take 1 tablet (100 mg total) by mouth 2 (two) times daily. 60 tablet 3   HYDROcodone -acetaminophen  (HYCET) 7.5-325 mg/15 ml solution Take 15 mLs by mouth every 6 (six) hours as needed for moderate pain (pain score 4-6). 473 mL 0   lidocaine  (XYLOCAINE ) 2 % solution Take by mouth.     magnesium  oxide (MAG-OX) 400 MG tablet Take 2 tablets (800 mg total) by mouth 2 (  two) times daily. 120 tablet 3   Menthol, Topical Analgesic, (BIOFREEZE EX) Apply 1 application  topically daily as needed (pain).     naloxone  (NARCAN ) nasal spray 4 mg/0.1 mL SMARTSIG:Both Nares     Polyethyl Glycol-Propyl Glycol (GOODSENSE LUBRICANT EYE DROPS OP) Place 1 drop into both eyes daily as needed (dry eyes).     pravastatin  (PRAVACHOL ) 80 MG tablet Take 80 mg by mouth daily.     predniSONE  (DELTASONE ) 10 MG tablet Take  4 each am x 2 days,   2 each am x 2 days,  1 each am x 2 days and stop 14 tablet 0   RAPAFLO 8 MG CAPS capsule Take 8 mg by mouth daily. Silodosin     vitamin B-12 (CYANOCOBALAMIN ) 500 MCG tablet Take 500 mcg by mouth daily.     No current facility-administered medications for this encounter.     REVIEW OF SYSTEMS: On review of systems, the patient reports that he does continue to struggle with pain in the floor of his mouth.  He does have difficulties controlling his saliva.  That being said he is able to eat well and maintain his nutritional status.  He is supported by his 2 sisters and brother who help check in and  provide transportation and support to him.  He denies any abdominal pain, nausea or vomiting or difficulty with bowel function.  He has not had unintended weight loss.  No other complaints are verbalized.  PHYSICAL EXAM:  Wt Readings from Last 3 Encounters:  01/20/23 157 lb 3.2 oz (71.3 kg)  01/20/23 157 lb 2 oz (71.3 kg)  01/14/23 154 lb 1.6 oz (69.9 kg)   Temp Readings from Last 3 Encounters:  01/14/23 (!) 97.1 F (36.2 C) (Tympanic)  01/14/23 98.2 F (36.8 C) (Oral)  12/31/22 (!) 96.6 F (35.9 C) (Tympanic)   BP Readings from Last 3 Encounters:  01/20/23 (!) 149/80  01/20/23 (!) 149/90  01/14/23 (!) 148/67   Pulse Readings from Last 3 Encounters:  01/20/23 98  01/20/23 98  01/14/23 85   Pain Assessment Pain Score: 0-No pain/10  In general this is a well appearing African-American male in no acute distress.  He's alert and oriented x4 and appropriate throughout the examination. Cardiopulmonary assessment is negative for acute distress and he exhibits normal effort.     ECOG = 1  0 - Asymptomatic (Fully active, able to carry on all predisease activities without restriction)  1 - Symptomatic but completely ambulatory (Restricted in physically strenuous activity but ambulatory and able to carry out work of a light or sedentary nature. For example, light housework, office work)  2 - Symptomatic, <50% in bed during the day (Ambulatory and capable of all self care but unable to carry out any work activities. Up and about more than 50% of waking hours)  3 - Symptomatic, >50% in bed, but not bedbound (Capable of only limited self-care, confined to bed or chair 50% or more of waking hours)  4 - Bedbound (Completely disabled. Cannot carry on any self-care. Totally confined to bed or chair)  5 - Death   Raylene MM, Creech RH, Tormey DC, et al. [phone]). Toxicity and response criteria of the [facility]. Am. DOROTHA Bridges. Oncol. 5 (6): 649-55    LABORATORY  DATA:  Lab Results  Component Value Date   WBC 6.2 01/14/2023   HGB 12.3 (L) 01/14/2023   HCT 37.9 (L) 01/14/2023   MCV 101.1 (H) 01/14/2023  PLT 263 01/14/2023   Lab Results  Component Value Date   NA 137 01/14/2023   K 3.9 01/14/2023   CL 102 01/14/2023   CO2 26 01/14/2023   Lab Results  Component Value Date   ALT 14 01/14/2023   AST 19 01/14/2023   ALKPHOS 45 01/14/2023   BILITOT 0.8 01/14/2023      RADIOGRAPHY: MR LIVER W WO CONTRAST Result Date: 01/14/2023 CLINICAL DATA:  Head neck carcinoma.  Evaluate liver metastasis. EXAM: MRI ABDOMEN WITHOUT AND WITH CONTRAST TECHNIQUE: Multiplanar multisequence MR imaging of the abdomen was performed both before and after the administration of intravenous contrast. CONTRAST:  7mL GADAVIST  GADOBUTROL  1 MMOL/ML IV SOLN COMPARISON:  PET-CT 12/24/2022, abdominal MRI 01/02/2022. FINDINGS: Lower chest:  Lung bases are clear. Hepatobiliary: RIGHT hepatic lobe lesion (segment 7/8) is increased in size from comparison MRI. Lesion measures 4.8 by 3.3 cm compared to 2.7 x 2.7 cm on MRI 01/02/2022. Lesion was peripherally hypermetabolic on most recent FDG PET scan. Lesion has peripheral enhancement insert central hypoenhancement to suggest necrosis (image 13/series 14. No new lesions in the liver. Gallbladder biliary tree normal. Pancreas: Normal pancreatic parenchymal intensity. No ductal dilatation or inflammation. Spleen: Normal spleen. Adrenals/urinary tract: Adrenal glands and kidneys are normal. Bilateral nonenhancing renal cysts. No follow-up recommended for benign renal lesion. Stomach/Bowel: Stomach and limited of the small bowel is unremarkable Vascular/Lymphatic: Abdominal aortic normal caliber. No retroperitoneal periportal lymphadenopathy. Musculoskeletal: No aggressive osseous lesion IMPRESSION: 1. RIGHT hepatic lobe lesion is increased in size from MRI 1 year prior. Lesion has peripheral enhancement. Lesion peripherally hypermetabolic on most  recent FDG PET scan. 2. No new hepatic lesions present. Electronically Signed   By: Jackquline Boxer M.D.   On: 01/14/2023 09:39   NM PET Image Restage (PS) Skull Base to Thigh (F-18 FDG) Result Date: 12/30/2022 CLINICAL DATA:  Subsequent treatment strategy for floor of mouth cancer completed radiation 1 month prior and chemotherapy 2 weeks prior. EXAM: NUCLEAR MEDICINE PET SKULL BASE TO THIGH TECHNIQUE: 7.8 mCi F-18 FDG was injected intravenously. Full-ring PET imaging was performed from the skull base to thigh after the radiotracer. CT data was obtained and used for attenuation correction and anatomic localization. Fasting blood glucose: 113 mg/dl COMPARISON:  91/70/7975 PET-CT. FINDINGS: Mediastinal blood pool activity: SUV max 1.6 Liver activity: SUV max NA NECK: Hypermetabolic solid 4.2 x 2.8 cm anterior floor of mouth mass with max SUV 10.4 (series 202/image 23), previously 4.2 x 2.7 cm with max SUV 14.8, stable size and mildly decreased in metabolism. Stable associated lytic erosion of the anterior midline mandible. Hypermetabolic level Ib right neck 1.1 cm node with max SUV 7.5 (series 202/image 28), previously 1.1 cm with max SUV 6.9, not substantially changed. Hypermetabolic right level IV neck 1.3 cm node with max SUV 9.4 (series 202/image 39), previously 1.1 cm with max SUV 6.8, mildly increased in size and metabolism. No additional enlarged or hypermetabolic neck nodes. Incidental CT findings: None. CHEST: No enlarged or hypermetabolic axillary, mediastinal or hilar lymph nodes. No hypermetabolic pulmonary findings. Incidental CT findings: Left subclavian Port-A-Cath terminates in the upper third of the SVC. Coronary atherosclerosis. Atherosclerotic nonaneurysmal thoracic aorta. Mild centrilobular emphysema. Tiny solid 0.2 cm peripheral right middle lobe pulmonary nodule on series 203/image 33), unchanged and below PET resolution. ABDOMEN/PELVIS: Hypermetabolic 4.6 cm right liver dome mass with max  SUV 8.9 (series 202/image 78), previously 3.8 cm with max SUV 3.8, increased in size and metabolism. No additional hypermetabolic liver masses. No  abnormal hypermetabolic activity within the pancreas, adrenal glands, or spleen. No hypermetabolic lymph nodes in the abdomen or pelvis. Incidental CT findings: Nonobstructing 3 mm lower right renal stone. Simple small bilateral renal cysts, largest 1.3 cm in the lateral upper right kidney, for which no follow-up imaging is recommended. Atherosclerotic nonaneurysmal abdominal aorta. Moderate prostatomegaly. SKELETON: No additional foci of hypermetabolic activity to suggest skeletal metastasis. Incidental CT findings: None. IMPRESSION: 1. Hypermetabolic anterior floor of mouth mass, stable in size and mildly decreased in metabolism. Stable associated lytic erosion of the anterior midline mandible. 2. Stable hypermetabolic right level Ib nodal metastasis. Right level IV neck nodal metastasis is mildly increased in size and metabolism. 3. Hypermetabolic 4.6 cm right liver dome metastasis, increased in size and metabolism. 4. No new sites of hypermetabolic metastatic disease. 5. Aortic Atherosclerosis (ICD10-I70.0) and Emphysema (ICD10-J43.9). Electronically Signed   By: Selinda DELENA Blue M.D.   On: 12/30/2022 16:34   MR Brain W Wo Contrast Result Date: 12/30/2022 CLINICAL DATA:  Metastatic disease evaluation, staging EXAM: MRI HEAD WITHOUT AND WITH CONTRAST TECHNIQUE: Multiplanar, multiecho pulse sequences of the brain and surrounding structures were obtained without and with intravenous contrast. CONTRAST:  7mL GADAVIST  GADOBUTROL  1 MMOL/ML IV SOLN COMPARISON:  No prior MRI available FINDINGS: Brain: No restricted diffusion to suggest acute or subacute infarct. No abnormal parenchymal or meningeal enhancement. No acute hemorrhage, mass, mass effect, or midline shift. No hydrocephalus or extra-axial collection. Pituitary and craniocervical junction within normal limits.  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 No hemosiderin deposition to suggest remote hemorrhage. Advanced cerebral atrophy for age, most prominent in the parietal lobes. 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 Vascular: Normal arterial flow voids. Normal arterial and venous enhancement. Skull and upper cervical spine: Normal marrow signal. Sinuses/Orbits: Mucosal thickening in the left frontal sinus and anterior ethmoid air cells. No acute finding in the orbits. Status post bilateral lens replacements. Other: Fluid in the right-greater-than-left mastoid air cells. IMPRESSION: No acute intracranial process. No evidence of intracranial metastatic disease. Electronically Signed   By: Donald Campion M.D.   On: 12/30/2022 15:55       IMPRESSION/PLAN: 1. 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  Dr. Dewey discusses the findings from the patient's recent MRI scans.  While he is not a candidate for locally ablative therapy with interventional procedures, Dr. Dewey believes that he would be a good candidate for stereotactic body radiotherapy (SBRT).  We discussed the logistics of needing fiducial marker placement adjacent to the tumor and we will message Dr. Wyatt who performed his previous liver biopsy to see if we can coordinate with their department.  Following placement of fiducial markers, we would recommend simulation with IV contrast.  We would anticipate that his treatment would begin approximately 1-1/2 weeks after the simulation, and we discussed 5 fractions every other day to the liver lesion.  The patient and his sisters are in agreement to proceed after we discussed the risks, benefits, short and long-term effects of  radiotherapy. Written consent is obtained and placed in the chart, a copy was provided to the patient.  I will reach out to our interventional colleagues and be in touch with the patient about scheduling simulation once we have a date for fiducial marker  placement.  He is in agreement with this plan.  In a visit lasting 60 minutes, greater than 50% of the time was spent face to face discussing the patient's condition, in preparation for the discussion, and coordinating the patient's care.   The above documentation reflects my direct findings during this shared patient visit. Please see the separate note by Dr. Dewey on this date for the remainder of the patient's plan of care.    Donald KYM Husband, [facility]   **Disclaimer: This note was dictated with voice recognition software. Similar sounding words can inadvertently be transcribed and this note may contain transcription errors which may not have been corrected upon publication of note.**

## 2023-01-21 ENCOUNTER — Inpatient Hospital Stay: Payer: 59

## 2023-01-21 DIAGNOSIS — Z5111 Encounter for antineoplastic chemotherapy: Secondary | ICD-10-CM | POA: Diagnosis not present

## 2023-01-21 DIAGNOSIS — I959 Hypotension, unspecified: Secondary | ICD-10-CM | POA: Diagnosis not present

## 2023-01-21 DIAGNOSIS — C049 Malignant neoplasm of floor of mouth, unspecified: Secondary | ICD-10-CM

## 2023-01-21 DIAGNOSIS — R21 Rash and other nonspecific skin eruption: Secondary | ICD-10-CM | POA: Diagnosis not present

## 2023-01-21 DIAGNOSIS — Z87891 Personal history of nicotine dependence: Secondary | ICD-10-CM | POA: Diagnosis not present

## 2023-01-21 DIAGNOSIS — C787 Secondary malignant neoplasm of liver and intrahepatic bile duct: Secondary | ICD-10-CM | POA: Diagnosis not present

## 2023-01-21 LAB — CBC WITH DIFFERENTIAL/PLATELET
Abs Immature Granulocytes: 0.03 10*3/uL (ref 0.00–0.07)
Basophils Absolute: 0.1 10*3/uL (ref 0.0–0.1)
Basophils Relative: 1 %
Eosinophils Absolute: 0.3 10*3/uL (ref 0.0–0.5)
Eosinophils Relative: 4 %
HCT: 38 % — ABNORMAL LOW (ref 39.0–52.0)
Hemoglobin: 12.4 g/dL — ABNORMAL LOW (ref 13.0–17.0)
Immature Granulocytes: 0 %
Lymphocytes Relative: 21 %
Lymphs Abs: 1.5 10*3/uL (ref 0.7–4.0)
MCH: 32.9 pg (ref 26.0–34.0)
MCHC: 32.6 g/dL (ref 30.0–36.0)
MCV: 100.8 fL — ABNORMAL HIGH (ref 80.0–100.0)
Monocytes Absolute: 0.8 10*3/uL (ref 0.1–1.0)
Monocytes Relative: 11 %
Neutro Abs: 4.5 10*3/uL (ref 1.7–7.7)
Neutrophils Relative %: 63 %
Platelets: 297 10*3/uL (ref 150–400)
RBC: 3.77 MIL/uL — ABNORMAL LOW (ref 4.22–5.81)
RDW: 13.4 % (ref 11.5–15.5)
WBC: 7.3 10*3/uL (ref 4.0–10.5)
nRBC: 0 % (ref 0.0–0.2)

## 2023-01-21 LAB — COMPREHENSIVE METABOLIC PANEL
ALT: 14 U/L (ref 0–44)
AST: 18 U/L (ref 15–41)
Albumin: 3.3 g/dL — ABNORMAL LOW (ref 3.5–5.0)
Alkaline Phosphatase: 47 U/L (ref 38–126)
Anion gap: 9 (ref 5–15)
BUN: 11 mg/dL (ref 8–23)
CO2: 25 mmol/L (ref 22–32)
Calcium: 10.1 mg/dL (ref 8.9–10.3)
Chloride: 101 mmol/L (ref 98–111)
Creatinine, Ser: 0.65 mg/dL (ref 0.61–1.24)
GFR, Estimated: >60 mL/min (ref >60)
Glucose, Bld: 107 mg/dL — ABNORMAL HIGH (ref 70–99)
Potassium: 4.2 mmol/L (ref 3.5–5.1)
Sodium: 135 mmol/L (ref 135–145)
Total Bilirubin: 0.6 mg/dL (ref 0.0–1.2)
Total Protein: 7.2 g/dL (ref 6.5–8.1)

## 2023-01-21 LAB — MAGNESIUM: Magnesium: 1.3 mg/dL — ABNORMAL LOW (ref 1.7–2.4)

## 2023-01-21 MED ORDER — METHYLPREDNISOLONE SODIUM SUCC 125 MG IJ SOLR
125.0000 mg | Freq: Once | INTRAMUSCULAR | Status: AC
Start: 2023-01-21 — End: 2023-01-21
  Administered 2023-01-21: 125 mg via INTRAVENOUS
  Filled 2023-01-21: qty 2

## 2023-01-21 MED ORDER — MAGNESIUM SULFATE 4 GM/100ML IV SOLN
4.0000 g | Freq: Once | INTRAVENOUS | Status: AC
  Administered 2023-01-21: 4 g via INTRAVENOUS
  Filled 2023-01-21: qty 100

## 2023-01-21 MED ORDER — DIPHENHYDRAMINE HCL 50 MG/ML IJ SOLN
50.0000 mg | Freq: Once | INTRAMUSCULAR | Status: AC
  Administered 2023-01-21: 50 mg via INTRAVENOUS
  Filled 2023-01-21: qty 1

## 2023-01-21 MED ORDER — HEPARIN SOD (PORK) LOCK FLUSH 100 UNIT/ML IV SOLN
500.0000 [IU] | Freq: Once | INTRAVENOUS | Status: AC | PRN
  Administered 2023-01-21: 500 [IU]

## 2023-01-21 MED ORDER — SODIUM CHLORIDE 0.9 % IV SOLN: Freq: Once | INTRAVENOUS | Status: AC

## 2023-01-21 MED ORDER — CETUXIMAB CHEMO IV INJECTION 200 MG/100ML
500.0000 mg/m2 | Freq: Once | INTRAVENOUS | Status: AC
  Administered 2023-01-21: 900 mg via INTRAVENOUS
  Filled 2023-01-21: qty 400

## 2023-01-21 MED ORDER — FAMOTIDINE IN NACL 20-0.9 MG/50ML-% IV SOLN
20.0000 mg | Freq: Once | INTRAVENOUS | Status: AC
  Administered 2023-01-21: 20 mg via INTRAVENOUS
  Filled 2023-01-21: qty 50

## 2023-01-21 MED ORDER — SODIUM CHLORIDE 0.9% FLUSH
10.0000 mL | INTRAVENOUS | Status: DC | PRN
  Administered 2023-01-21: 10 mL

## 2023-01-21 NOTE — Patient Instructions (Signed)
 [facility] [place] - A DEPT OF [facility]  Discharge Instructions: Thank you for choosing [facility] to provide your oncology and hematology care.  If you have a lab appointment with the Cancer Center - please note that after April 8th, 2024, all labs will be drawn in the cancer center.  You do not have to check in or register with the main entrance as you have in the past but will complete your check-in in the cancer center.  Wear comfortable clothing and clothing appropriate for easy access to any Portacath or PICC line.   We strive to give you quality time with your provider. You may need to reschedule your appointment if you arrive late (15 or more minutes).  Arriving late affects you and other patients whose appointments are after yours.  Also, if you miss three or more appointments without notifying the office, you may be dismissed from the clinic at the provider's discretion.      For prescription refill requests, have your pharmacy contact our office and allow 72 hours for refills to be completed.    Today you received the following chemotherapy and/or immunotherapy agents Erbitux.  Cetuximab Injection What is this medication? CETUXIMAB (se TUX i mab) treats head and neck cancer. It may also be used to treat colorectal cancer. It works by blocking a protein that causes cancer cells to grow and multiply. This helps to slow or stop the spread of cancer cells. It is a monoclonal antibody. This medicine may be used for other purposes; ask your health care provider or pharmacist if you have questions. COMMON BRAND NAME(S): Erbitux What should I tell my care team before I take this medication? They need to know if you have any of these conditions: Heart disease History of tick bites Low levels of calcium, magnesium, or potassium in the blood Lung disease Red meat allergy An unusual or allergic reaction to cetuximab, other medications, foods, dyes,  or preservatives Pregnant or trying to get pregnant Breast-feeding How should I use this medication? This medication is infused into a vein. It is given by your care team in a hospital or clinic setting. Talk to your care team about the use of this medication in children. Special care may be needed. Overdosage: If you think you have taken too much of this medicine contact a poison control center or emergency room at once. NOTE: This medicine is only for you. Do not share this medicine with others. What if I miss a dose? Keep appointments for follow-up doses. It is important not to miss your dose. Call your care team if you are unable to keep an appointment. What may interact with this medication? Interactions are not expected. This list may not describe all possible interactions. Give your health care provider a list of all the medicines, herbs, non-prescription drugs, or dietary supplements you use. Also tell them if you smoke, drink alcohol, or use illegal drugs. Some items may interact with your medicine. What should I watch for while using this medication? Visit your care team for regular checks on your progress. This medication may make you feel generally unwell. This is not uncommon, as chemotherapy can affect healthy cells as well as cancer cells. Report any side effects. Continue your course of treatment even though you feel ill unless your care team tells you to stop. You may need blood work done while you are taking this medication. This medication can make you more sensitive  to the sun. Keep out of the sun while taking this medication and for 2 months after the last dose. If you cannot avoid being in the sun, wear protective clothing and sunscreen. Do not use sun lamps, tanning beds, or tanning booths. This medication can cause serious infusion reactions. To reduce the risk, your care team may give you other medications to take before receiving this one. Be sure to follow the directions of  your care team. This medication may cause serious skin reactions. They can happen weeks to months after starting the medication. Contact your care team right away if you notice fevers or flu-like symptoms with a rash. The rash may be red or purple and then turn into blisters or peeling of the skin. You may also notice a red rash with swelling of the face, lips, or lymph nodes in your neck or under your arms. Talk to your care team if you may be pregnant. Serious birth defects can occur if you take this medication during pregnancy and for 2 months after the last dose. You will need a negative pregnancy test before starting this medication. Contraception is recommended while taking this medication and for 2 months after the last dose. Your care team can help you find the option that works for you. Do not breastfeed while taking this medication and for 2 months after the last dose. This medication may cause infertility. Talk to your care team if you are concerned about your fertility. What side effects may I notice from receiving this medication? Side effects that you should report to your care team as soon as possible: Allergic reactions--skin rash, itching, hives, swelling of the face, lips, tongue, or throat Dry cough, shortness of breath or trouble breathing Heart attack--pain or tightness in the chest, shoulders, arms, or jaw, nausea, shortness of breath, cold or clammy skin, feeling faint or lightheaded Infusion reactions--chest pain, shortness of breath or trouble breathing, feeling faint or lightheaded Low calcium level--muscle pain or cramps, confusion, tingling, or numbness in the hands or feet Low magnesium level--muscle pain or cramps, unusual weakness or fatigue, fast or irregular heartbeat, tremors Low potassium level--muscle pain or cramps, unusual weakness or fatigue, fast or irregular heartbeat, constipation Redness, blistering, peeling, or loosening of the skin, including inside the  mouth Side effects that usually do not require medical attention (report to your care team if they continue or are bothersome): Diarrhea Headache Joint pain Nausea Unusual weakness or fatigue Weight loss This list may not describe all possible side effects. Call your doctor for medical advice about side effects. You may report side effects to FDA at 1-800-FDA-1088. Where should I keep my medication? This medication is given in a hospital or clinic. It will not be stored at home. NOTE: This sheet is a summary. It may not cover all possible information. If you have questions about this medicine, talk to your doctor, pharmacist, or health care provider.  2024 Elsevier/Gold Standard (2021-05-16 00:00:00)       To help prevent nausea and vomiting after your treatment, we encourage you to take your nausea medication as directed.  BELOW ARE SYMPTOMS THAT SHOULD BE REPORTED IMMEDIATELY: *FEVER GREATER THAN 100.4 F (38 C) OR HIGHER *CHILLS OR SWEATING *NAUSEA AND VOMITING THAT IS NOT CONTROLLED WITH YOUR NAUSEA MEDICATION *UNUSUAL SHORTNESS OF BREATH *UNUSUAL BRUISING OR BLEEDING *URINARY PROBLEMS (pain or burning when urinating, or frequent urination) *BOWEL PROBLEMS (unusual diarrhea, constipation, pain near the anus) TENDERNESS IN MOUTH AND THROAT WITH OR WITHOUT PRESENCE  OF ULCERS (sore throat, sores in mouth, or a toothache) UNUSUAL RASH, SWELLING OR PAIN  UNUSUAL VAGINAL DISCHARGE OR ITCHING   Items with * indicate a potential emergency and should be followed up as soon as possible or go to the Emergency Department if any problems should occur.  Please show the CHEMOTHERAPY ALERT CARD or IMMUNOTHERAPY ALERT CARD at check-in to the Emergency Department and triage nurse.  Should you have questions after your visit or need to cancel or reschedule your appointment, please contact [facility] [place] - A DEPT OF Eligha Bridegroom [facility] [phone]  and follow the prompts.   Office hours are 8:00 a.m. to 4:30 p.m. Monday - Friday. Please note that voicemails left after 4:00 p.m. may not be returned until the following business day.  We are closed weekends and major holidays. You have access to a nurse at all times for urgent questions. Please call the main number to the clinic [phone] and follow the prompts.  For any non-urgent questions, you may also contact your provider using MyChart. We now offer e-Visits for anyone 82 and older to request care online for non-urgent symptoms. For details visit [email or web address].   Also download the MyChart app! Go to the app store, search "MyChart", open the app, select [place], and log in with your MyChart username and password.

## 2023-01-21 NOTE — Progress Notes (Signed)
 Patient presents today for Erbitux  infusion. Lab work within parameters for treatment. Magnesium  1.3. Standing orders followed. Patient will receive 4 grams Magnesium  Sulfate. Pulse 110 on arrival. Recheck 104.   Message received from Dr. Katragadda / A.Anderson RN to proceed with treatment.   Treatment given today per MD orders. Tolerated infusion without adverse affects. Vital signs stable. No complaints at this time. Discharged from clinic by wheel chair in stable condition. Alert and oriented x 3. F/U with [facility] as scheduled.

## 2023-01-21 NOTE — Addendum Note (Signed)
 Encounter addended by: Ronny Bacon, PA-C on: 01/21/2023 7:28 AM  Actions taken: Clinical Note Signed

## 2023-01-25 ENCOUNTER — Other Ambulatory Visit: Payer: Self-pay | Admitting: *Deleted

## 2023-01-25 MED ORDER — HYDROCODONE-ACETAMINOPHEN 7.5-325 MG/15ML PO SOLN: 15.0000 mL | Freq: Four times a day (QID) | ORAL | 0 refills | Status: DC | PRN

## 2023-01-25 NOTE — Progress Notes (Signed)
 Johann Sieving, MD  Michaelene Setter PROCEDURE / BIOPSY REVIEW Date: 01/25/23  Requested Biopsy site: fiducial marker placement liver lesion Reason for request: pre xrt Imaging review: Best seen on MR 01/07/23 and PET 12/24/22  Decision: Approved Imaging modality to perform: Ultrasound and CT Schedule with: Moderate Sedation Schedule for: Any VIR  Additional comments: @VIR : intervening lung on PET/CT. US  may allow better approach from below but tough to get to lateral margin. Or creative positioning in CT. Needs CT fluoro if so.  Please contact me with questions, concerns, or if issue pertaining to this request arise.  Dayne Sieving Johann, MD [facility]       Previous Messages    ----- Message ----- From: Gabriela Giannelli Sent: 01/25/2023  10:03 AM EST To: Dianara Smullen; Ir Procedure Requests Subject: FW: CT Biopsy - DR. MIR please review           ----- Message ----- From: Jaylun Fleener Sent: 01/21/2023   8:50 AM EST To: Aliene SAUNDERS Mir, MD; Setter Michaelene Subject: CT Biopsy - DR. 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 I messaged Dr. Katha about this pt!     History : Mr liver w/wo , MR Brain w/wo , NM PET  Provider: Lanell Donald Stagger, PA-C  Provider contact:  [phone]

## 2023-01-27 ENCOUNTER — Encounter: Payer: Self-pay | Admitting: Hematology

## 2023-01-27 ENCOUNTER — Other Ambulatory Visit: Payer: Self-pay

## 2023-01-28 ENCOUNTER — Inpatient Hospital Stay: Payer: 59

## 2023-01-28 ENCOUNTER — Inpatient Hospital Stay: Payer: 59 | Admitting: Hematology

## 2023-02-01 ENCOUNTER — Other Ambulatory Visit: Payer: Self-pay

## 2023-02-02 ENCOUNTER — Other Ambulatory Visit: Payer: Self-pay

## 2023-02-03 ENCOUNTER — Encounter: Payer: Self-pay | Admitting: Hematology

## 2023-02-03 ENCOUNTER — Inpatient Hospital Stay: Payer: 59 | Admitting: Licensed Clinical Social Worker

## 2023-02-03 DIAGNOSIS — C049 Malignant neoplasm of floor of mouth, unspecified: Secondary | ICD-10-CM

## 2023-02-03 NOTE — Progress Notes (Signed)
[facility] Healthcare Advance Directives Clinical Social Work  Patient presented to Advance Directives Clinic  to review and complete healthcare advance directives.  Clinical Social Worker met with patient and pt's sister Nathaniel Hicks.  The patient designated Nathaniel Hicks and Nathaniel Hicks both as their primary healthcare proxy.  Pt's wishes are for both of his sisters to be consulted and in agreement regarding medical decisions.  Patient also completed healthcare living will.    Documents were notarized and copies made for patient/family. Clinical Social Worker will send documents to medical records to be scanned into patient's chart. Clinical Social Worker encouraged patient/family to contact with any additional questions or concerns.   Rachel Moulds, LCSW Clinical Social Worker [facility]        Patient is participating in a Managed Medicaid Plan:  Yes

## 2023-02-04 ENCOUNTER — Inpatient Hospital Stay (HOSPITAL_BASED_OUTPATIENT_CLINIC_OR_DEPARTMENT_OTHER): Payer: 59 | Admitting: Hematology

## 2023-02-04 ENCOUNTER — Encounter: Payer: Self-pay | Admitting: Hematology

## 2023-02-04 ENCOUNTER — Inpatient Hospital Stay: Payer: 59

## 2023-02-04 VITALS — Ht 71.0 in | Wt 154.0 lb

## 2023-02-04 VITALS — BP 119/67 | HR 103 | Temp 96.3°F | Resp 20

## 2023-02-04 VITALS — BP 147/55 | HR 87 | Temp 97.8°F | Resp 18

## 2023-02-04 DIAGNOSIS — C049 Malignant neoplasm of floor of mouth, unspecified: Secondary | ICD-10-CM | POA: Diagnosis not present

## 2023-02-04 DIAGNOSIS — C01 Malignant neoplasm of base of tongue: Secondary | ICD-10-CM

## 2023-02-04 DIAGNOSIS — Z5111 Encounter for antineoplastic chemotherapy: Secondary | ICD-10-CM | POA: Diagnosis not present

## 2023-02-04 DIAGNOSIS — C787 Secondary malignant neoplasm of liver and intrahepatic bile duct: Secondary | ICD-10-CM | POA: Diagnosis not present

## 2023-02-04 DIAGNOSIS — Z95828 Presence of other vascular implants and grafts: Secondary | ICD-10-CM

## 2023-02-04 DIAGNOSIS — Z87891 Personal history of nicotine dependence: Secondary | ICD-10-CM | POA: Diagnosis not present

## 2023-02-04 DIAGNOSIS — I959 Hypotension, unspecified: Secondary | ICD-10-CM | POA: Diagnosis not present

## 2023-02-04 DIAGNOSIS — R21 Rash and other nonspecific skin eruption: Secondary | ICD-10-CM | POA: Diagnosis not present

## 2023-02-04 LAB — COMPREHENSIVE METABOLIC PANEL
ALT: 14 U/L (ref 0–44)
AST: 17 U/L (ref 15–41)
Albumin: 3.2 g/dL — ABNORMAL LOW (ref 3.5–5.0)
Alkaline Phosphatase: 50 U/L (ref 38–126)
Anion gap: 8 (ref 5–15)
BUN: 10 mg/dL (ref 8–23)
CO2: 26 mmol/L (ref 22–32)
Calcium: 10 mg/dL (ref 8.9–10.3)
Chloride: 102 mmol/L (ref 98–111)
Creatinine, Ser: 0.67 mg/dL (ref 0.61–1.24)
GFR, Estimated: >60 mL/min (ref >60)
Glucose, Bld: 97 mg/dL (ref 70–99)
Potassium: 4 mmol/L (ref 3.5–5.1)
Sodium: 136 mmol/L (ref 135–145)
Total Bilirubin: 0.7 mg/dL (ref 0.0–1.2)
Total Protein: 6.5 g/dL (ref 6.5–8.1)

## 2023-02-04 LAB — CBC WITH DIFFERENTIAL/PLATELET
Abs Immature Granulocytes: 0.02 10*3/uL (ref 0.00–0.07)
Basophils Absolute: 0.1 10*3/uL (ref 0.0–0.1)
Basophils Relative: 1 %
Eosinophils Absolute: 0.3 10*3/uL (ref 0.0–0.5)
Eosinophils Relative: 4 %
HCT: 37.2 % — ABNORMAL LOW (ref 39.0–52.0)
Hemoglobin: 12.1 g/dL — ABNORMAL LOW (ref 13.0–17.0)
Immature Granulocytes: 0 %
Lymphocytes Relative: 15 %
Lymphs Abs: 1.1 10*3/uL (ref 0.7–4.0)
MCH: 32.9 pg (ref 26.0–34.0)
MCHC: 32.5 g/dL (ref 30.0–36.0)
MCV: 101.1 fL — ABNORMAL HIGH (ref 80.0–100.0)
Monocytes Absolute: 0.7 10*3/uL (ref 0.1–1.0)
Monocytes Relative: 10 %
Neutro Abs: 5 10*3/uL (ref 1.7–7.7)
Neutrophils Relative %: 70 %
Platelets: 297 10*3/uL (ref 150–400)
RBC: 3.68 MIL/uL — ABNORMAL LOW (ref 4.22–5.81)
RDW: 13.5 % (ref 11.5–15.5)
WBC: 7.1 10*3/uL (ref 4.0–10.5)
nRBC: 0 % (ref 0.0–0.2)

## 2023-02-04 LAB — MAGNESIUM: Magnesium: 1.4 mg/dL — ABNORMAL LOW (ref 1.7–2.4)

## 2023-02-04 MED ORDER — METHYLPREDNISOLONE SODIUM SUCC 125 MG IJ SOLR
125.0000 mg | Freq: Once | INTRAMUSCULAR | Status: AC
  Administered 2023-02-04: 125 mg via INTRAVENOUS
  Filled 2023-02-04: qty 2

## 2023-02-04 MED ORDER — MAGNESIUM SULFATE 2 GM/50ML IV SOLN: 2.0000 g | Freq: Once | INTRAVENOUS | Status: DC

## 2023-02-04 MED ORDER — CETUXIMAB CHEMO IV INJECTION 200 MG/100ML
500.0000 mg/m2 | Freq: Once | INTRAVENOUS | Status: AC
  Administered 2023-02-04: 900 mg via INTRAVENOUS
  Filled 2023-02-04: qty 400

## 2023-02-04 MED ORDER — SODIUM CHLORIDE 0.9% FLUSH
10.0000 mL | INTRAVENOUS | Status: DC | PRN
  Administered 2023-02-04: 10 mL

## 2023-02-04 MED ORDER — DIPHENHYDRAMINE HCL 50 MG/ML IJ SOLN
50.0000 mg | Freq: Once | INTRAMUSCULAR | Status: AC
  Administered 2023-02-04: 50 mg via INTRAVENOUS
  Filled 2023-02-04: qty 1

## 2023-02-04 MED ORDER — FAMOTIDINE IN NACL 20-0.9 MG/50ML-% IV SOLN
20.0000 mg | Freq: Once | INTRAVENOUS | Status: AC
  Administered 2023-02-04: 20 mg via INTRAVENOUS
  Filled 2023-02-04: qty 50

## 2023-02-04 MED ORDER — SODIUM CHLORIDE FLUSH 0.9 % IV SOLN
10.0000 mL | Freq: Once | INTRAVENOUS | Status: AC
  Administered 2023-02-04: 10 mL via INTRAVENOUS
  Filled 2023-02-04: qty 10

## 2023-02-04 MED ORDER — SODIUM CHLORIDE 0.9 % IV SOLN: Freq: Once | INTRAVENOUS | Status: AC

## 2023-02-04 MED ORDER — HEPARIN SOD (PORK) LOCK FLUSH 100 UNIT/ML IV SOLN
500.0000 [IU] | Freq: Once | INTRAVENOUS | Status: AC | PRN
  Administered 2023-02-04: 500 [IU]

## 2023-02-04 MED ORDER — MAGNESIUM SULFATE 4 GM/100ML IV SOLN
4.0000 g | Freq: Once | INTRAVENOUS | Status: AC
Start: 2023-02-04 — End: 2023-02-04
  Administered 2023-02-04: 4 g via INTRAVENOUS
  Filled 2023-02-04: qty 100

## 2023-02-04 NOTE — Patient Instructions (Signed)
[facility] [place] - [facility]  Discharge Instructions: Thank you for choosing  Cancer Center to provide your oncology and hematology care.  If you have a lab appointment with the Cancer Center - please note that after April 8th, 2024, all labs will be drawn in the cancer center.  You do not have to check in or register with the main entrance as you have in the past but will complete your check-in in the cancer center.  Wear comfortable clothing and clothing appropriate for easy access to any Portacath or PICC line.   We strive to give you quality time with your provider. You may need to reschedule your appointment if you arrive late (15 or more minutes).  Arriving late affects you and other patients whose appointments are after yours.  Also, if you miss three or more appointments without notifying the office, you may be dismissed from the clinic at the provider's discretion.      For prescription refill requests, have your pharmacy contact our office and allow 72 hours for refills to be completed.    Today you received the following chemotherapy and/or immunotherapy agents Erbitux.  Cetuximab Injection What is this medication? CETUXIMAB (se TUX i mab) treats head and neck cancer. It may also be used to treat colorectal cancer. It works by blocking a protein that causes cancer cells to grow and multiply. This helps to slow or stop the spread of cancer cells. It is a monoclonal antibody. This medicine may be used for other purposes; ask your health care provider or pharmacist if you have questions. COMMON BRAND NAME(S): Erbitux What should I tell my care team before I take this medication? They need to know if you have any of these conditions: Heart disease History of tick bites Low levels of calcium, magnesium, or potassium in the blood Lung disease Red meat allergy An unusual or allergic reaction to cetuximab, other medications, foods, dyes,  or preservatives Pregnant or trying to get pregnant Breast-feeding How should I use this medication? This medication is infused into a vein. It is given by your care team in a hospital or clinic setting. Talk to your care team about the use of this medication in children. Special care may be needed. Overdosage: If you think you have taken too much of this medicine contact a poison control center or emergency room at once. NOTE: This medicine is only for you. Do not share this medicine with others. What if I miss a dose? Keep appointments for follow-up doses. It is important not to miss your dose. Call your care team if you are unable to keep an appointment. What may interact with this medication? Interactions are not expected. This list may not describe all possible interactions. Give your health care provider a list of all the medicines, herbs, non-prescription drugs, or dietary supplements you use. Also tell them if you smoke, drink alcohol, or use illegal drugs. Some items may interact with your medicine. What should I watch for while using this medication? Visit your care team for regular checks on your progress. This medication may make you feel generally unwell. This is not uncommon, as chemotherapy can affect healthy cells as well as cancer cells. Report any side effects. Continue your course of treatment even though you feel ill unless your care team tells you to stop. You may need blood work done while you are taking this medication. This medication can make you more sensitive  to the sun. Keep out of the sun while taking this medication and for 2 months after the last dose. If you cannot avoid being in the sun, wear protective clothing and sunscreen. Do not use sun lamps, tanning beds, or tanning booths. This medication can cause serious infusion reactions. To reduce the risk, your care team may give you other medications to take before receiving this one. Be sure to follow the directions of  your care team. This medication may cause serious skin reactions. They can happen weeks to months after starting the medication. Contact your care team right away if you notice fevers or flu-like symptoms with a rash. The rash may be red or purple and then turn into blisters or peeling of the skin. You may also notice a red rash with swelling of the face, lips, or lymph nodes in your neck or under your arms. Talk to your care team if you may be pregnant. Serious birth defects can occur if you take this medication during pregnancy and for 2 months after the last dose. You will need a negative pregnancy test before starting this medication. Contraception is recommended while taking this medication and for 2 months after the last dose. Your care team can help you find the option that works for you. Do not breastfeed while taking this medication and for 2 months after the last dose. This medication may cause infertility. Talk to your care team if you are concerned about your fertility. What side effects may I notice from receiving this medication? Side effects that you should report to your care team as soon as possible: Allergic reactions--skin rash, itching, hives, swelling of the face, lips, tongue, or throat Dry cough, shortness of breath or trouble breathing Heart attack--pain or tightness in the chest, shoulders, arms, or jaw, nausea, shortness of breath, cold or clammy skin, feeling faint or lightheaded Infusion reactions--chest pain, shortness of breath or trouble breathing, feeling faint or lightheaded Low calcium level--muscle pain or cramps, confusion, tingling, or numbness in the hands or feet Low magnesium level--muscle pain or cramps, unusual weakness or fatigue, fast or irregular heartbeat, tremors Low potassium level--muscle pain or cramps, unusual weakness or fatigue, fast or irregular heartbeat, constipation Redness, blistering, peeling, or loosening of the skin, including inside the  mouth Side effects that usually do not require medical attention (report to your care team if they continue or are bothersome): Diarrhea Headache Joint pain Nausea Unusual weakness or fatigue Weight loss This list may not describe all possible side effects. Call your doctor for medical advice about side effects. You may report side effects to FDA at 1-800-FDA-1088. Where should I keep my medication? This medication is given in a hospital or clinic. It will not be stored at home. NOTE: This sheet is a summary. It may not cover all possible information. If you have questions about this medicine, talk to your doctor, pharmacist, or health care provider.  2024 Elsevier/Gold Standard (2021-05-16 00:00:00)       To help prevent nausea and vomiting after your treatment, we encourage you to take your nausea medication as directed.  BELOW ARE SYMPTOMS THAT SHOULD BE REPORTED IMMEDIATELY: *FEVER GREATER THAN 100.4 F (38 C) OR HIGHER *CHILLS OR SWEATING *NAUSEA AND VOMITING THAT IS NOT CONTROLLED WITH YOUR NAUSEA MEDICATION *UNUSUAL SHORTNESS OF BREATH *UNUSUAL BRUISING OR BLEEDING *URINARY PROBLEMS (pain or burning when urinating, or frequent urination) *BOWEL PROBLEMS (unusual diarrhea, constipation, pain near the anus) TENDERNESS IN MOUTH AND THROAT WITH OR WITHOUT PRESENCE  OF ULCERS (sore throat, sores in mouth, or a toothache) UNUSUAL RASH, SWELLING OR PAIN  UNUSUAL VAGINAL DISCHARGE OR ITCHING   Items with * indicate a potential emergency and should be followed up as soon as possible or go to the Emergency Department if any problems should occur.  Please show the CHEMOTHERAPY ALERT CARD or IMMUNOTHERAPY ALERT CARD at check-in to the Emergency Department and triage nurse.  Should you have questions after your visit or need to cancel or reschedule your appointment, please contact [facility] [place] - A DEPT OF [facility]igh[facility] Bridegroom [facility] [phone]  and follow the prompts.   Office hours are 8:00 a.m. to 4:30 p.m. Monday - Friday. Please note that voicemails left after 4:00 p.m. may not be returned until the following business day.  We are closed weekends and major holidays. You have access to a nurse at all times for urgent questions. Please call the main number to the clinic [phone] and follow the prompts.  For any non-urgent questions, you may also contact your provider using MyChart. We now offer e-Visits for anyone 3 and older to request care online for non-urgent symptoms. For details visit [email or web address].   Also download the MyChart app! Go to the app store, search "MyChart", open the app, select [facility], and log in with your MyChart username and password.

## 2023-02-04 NOTE — Progress Notes (Signed)
Patient has been examined by Dr. Ellin Saba. Vital signs and labs have been reviewed by MD - ANC, Creatinine, LFTs, hemoglobin, and platelets are within treatment parameters per M.D. - pt may proceed with treatment.  Magnesium 4g IV per standing orders. Primary RN and pharmacy notified.

## 2023-02-04 NOTE — Progress Notes (Signed)
[facility] [address] [place] [address]    Clinic Day:  02/10/2023  Referring physician: [name], MD  Patient Care Team: [name], MD as PCP - General (Internal Medicine) [name], MD as Consulting Physician (Urology) [name], MD as Medical Oncologist (Medical Oncology) [name], RN as Oncology Nurse Navigator (Medical Oncology)   ASSESSMENT & PLAN:   Assessment: 1.  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  Enlarged contralateral right submandibular node.  Enlargement of the left submandibular gland probably reactive.  No abscess. - Biopsy (07/18/2021) floor of the mouth: Invasive well to moderately differentiated keratinizing squamous cell carcinoma.  Tumor cells negative for p16. - 10/14/2021: Floor of the mouth resection, tracheostomy, bilateral selective neck dissections zones 1-3 by Dr. [name] - Pathology: Invasive moderately differentiated keratinizing SCC, 4.1 cm, carcinoma invades for a depth of about 1.9 cm and involves saliva gland tissue.  Anterior, posterior, right, left resection margins are involved.  Deep resection margin is negative.  Metastatic carcinoma 1/3 level 1 lymph nodes.  3 level 2 and 3 level 3 lymph nodes negative for carcinoma.  0/10 lymph nodes involved in the left side neck zone 1, 2, 3 dissection.  ENE not identified.  LVI/perineural invasion not identified. - I have talked to pathologist.  Reexcision margins were considered negative.  He has a high risk feature which is T4 tumor. -  PET scan on 12/12/2021: Soft tissue thickening and calcification along the ventral aspect of the trachea with associated hypermetabolism corresponding to recent tracheostomy site.  7 mm right  paratracheal lymph node new with SUV 2.4.  Hypermetabolism along the dome of the right hepatic lobe with probable 2.4 cm low-attenuation lesion - Liver lesion biopsy (01/28/2022): Metastatic squamous cell carcinoma, keratinizing. - NGS testing: PD-L1 (22 C3): CPS: 100, CD274 (PD-L1) amplified, JAK2 amplified, PIK3CA pathogenic variant exon 10, T p53 pathogenic variant, MS-stable, TMB-low - Per Dr. [name], he also has local recurrence of disease in the floor of the mouth. - Keytruda from 03/10/2022 through 08/24/2022 with progression - XRT to the floor of the mouth on 06/29/2022 - PET scan 09/10/2022: Liver lesion increased in size measures 3.1 x 2.9 cm, previously 2.6 x 1.7 cm.  Right cervical lymph node meta stasis, mildly progressive. - HER2 by IHC 0 on biopsy from 01/28/2022. - Weekly cetuximab started on 10/07/2022 - PET scan (12/24/2022): Hypermetabolic anterior floor of mouth mass stable.  Stable right level 1B nodal metastasis.  Hypermetabolic 4.6 cm right liver dome metastasis increased in size by 8 mm.  No new sites of hypermetabolic metastatic disease. - MRI liver (01/07/2023): Right hepatic liver lesion measures 4.8 x 3.3 cm.  No new hepatic lesions seen.   2.  Social/family history: - He lives in his apartment by himself and is independent of ADLs and IADLs.  He does not drive.  He worked as a Curator and several other jobs.  Quit smoking 1 month ago.  He smoked 1 pack/week for more than 50 years. - 1 brother had agent orange related cancer.  Another brother also had cancer, type unknown to the patient.   3.  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 - He was on close surveillance rather  than adjuvant chemotherapy.    Plan: 1.  Stage IV (PT4PN1 M1) SCC of the floor of the mouth, p16 negative: - He has started SBRT to the liver lesion. - He does not report any diarrhea from cetuximab. - Reviewed labs today: Normal LFTs.  CBC grossly normal. - Proceed  with treatment today and in 2 weeks.  RTC 4 weeks for follow-up.    2.  Oral pain: - Continue liquid hydrocodone twice daily as needed.  Pain is well-controlled.   3.  Cetuximab skin rash: - Continue doxycycline 100 mg twice daily and clindamycin gel twice daily.  No worsening of rash.   4.  Hypotension: - Continue to hold antihypertensives.  Blood pressure is 119/67.  5.  Hypomagnesemia: -He is taking magnesium 2 tablets twice daily.  Magnesium is severely low at 1.4.  He will receive IV magnesium.  He thinks magnesium causes nausea and dizziness when he takes 3 times a day.    No orders of the defined types were placed in this encounter.      Nathaniel Hicks,acting as a Neurosurgeon for [name], MD.,have documented all relevant documentation on the behalf of [name], MD,as directed by  [name], MD while in the presence of [name], MD.  I, [name] MD, have reviewed the above documentation for accuracy and completeness, and I agree with the above.       [name], MD   1/29/20255:51 PM  CHIEF COMPLAINT:   Diagnosis: squamous cell carcinoma of the floor of the mouth and testicular seminoma    Cancer Staging  Cancer of floor of mouth [facility]) Staging form: Oral Cavity, AJCC 8th Edition - Clinical stage from 11/25/2021: Stage IVC (cT4a, cN1, pM1) - Signed by [name], MD on 02/04/2022  Testicle cancer, right Staging form: Testis, AJCC 7th Edition - Clinical: Stage I (T2, N0, M0) - Signed by [name], PA-C on 07/23/2013    Prior Therapy: 1. Pembrolizumab, 03/10/22 - 08/24/22 2. 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 Testis, biopsy, right - SEMINOMA. PLEASE SEE COMMENT. 2. Testis, tumor, right - SEMINOMA, 7.5 CM. - ANGIOLYMPHATIC  INVASION PRESENT. - RESECTION MARGINS, NEGATIVE FOR ATYPIA OR MALIGNANCY.   01/18/2013 PET scan   Status post right orchiectomy. No findings specific for metastatic disease. Small para-aortic nodes measuring up to 5 mm short axis, without convincing hypermetabolism. Given location, attention on follow-up is suggested.   04/17/2013 Imaging   CT CAP- No evidence of metastatic disease in the chest, abdomen or pelvis. Proximal LAD coronary artery calcification.   07/20/2013 Imaging   CT abd/pelvis- No evidence of metastatic disease in the abdomen or pelvis.   10/24/2013 Imaging   CT abd/pelvis- No findings to suggest metastatic disease in the abdomen or pelvis   10/24/2013 Imaging   Chest xray- Probable COPD.  Negative for metastatic disease.   02/21/2014 Imaging   CT abd/pelvis- Stable abdominal pelvic CT status post right orchectomy. No evidence of adenopathy or other metastatic disease.   02/21/2014 Imaging   Chest xray- Left lower lobe mild atelectasis and/or infiltrate.     09/10/2014 Imaging   CT abd/pelvis- Status post right orchiectomy.   No evidence of metastatic disease.   3 mm nonobstructing right upper pole renal calculus. No hydronephrosis.   03/14/2015 Imaging   CT  abd/pelvis- Stable exam. No evidence of metastatic disease or other acute findings within the abdomen or pelvis.   09/13/2015 Imaging   CT abd/pelvis- No acute findings and no evidence for mass or adenopathy.    04/24/2016 Imaging   CT abd/pelvis: IMPRESSION: 1. Stable exam. No new or progressive findings. No features to suggest metastatic disease.   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HCC)  10/14/2021 Initial Diagnosis   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HCC)   11/25/2021 Cancer Staging   Staging form: Oral Cavity, AJCC 8th Edition - Clinical stage from 11/25/2021: Stage IVC (cT4a, cN1, pM1) - Signed by [name], MD on 02/04/2022 Histopathologic type: Squamous cell carcinoma, NOS Stage prefix: Initial  diagnosis Histologic grade (G): G2 Histologic grading system: 3 grade system   03/10/2022 - 08/24/2022 Chemotherapy   Patient is on Treatment Plan : HEAD/NECK Pembrolizumab (200) q21d     10/07/2022 -  Chemotherapy   Patient is on Treatment Plan : HEAD/NECK Cetuximab q7d        INTERVAL HISTORY:   Nathaniel Hicks is a 83 y.o. male presenting to clinic today for follow up of squamous cell carcinoma of the floor of the mouth and testicular seminoma. He was last seen by me on 01/14/23.  Since his last visit, he has consulted with Dr. Mitzi Hansen about SBRT. He will start SBRT after placing a marker on the liver lesion on 02/15/23. He will hopefully start SBRT on 02/19/23.  Today, he states that he is doing fair. His appetite level is at 90%. His energy level is at 75%. He is accompanied by his sister.  His sister reports he his feeling better since doubling his dosage and switching treatment to once every 2 weeks. He states magnesium caused nausea and dizziness when he took it TID. He currently takes Magnesium once in the morning and once in the evening and dizziness has resolved. His skin rash has resolved with doxycycline pills. He reports he has used clindamycin cream around his eyes, which has improved his rash. He is not taking pravastatin.   PAST MEDICAL HISTORY:   Past Medical History: Past Medical History:  Diagnosis Date   Arthritis    BPH (benign prostatic hyperplasia)    Difficult intubation    HOH (hard of hearing)    Hyperlipidemia 11/02/2017   Hypertension    Hypertension 11/02/2017   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HCC)    2014   Vitamin D deficiency 11/02/2017    Surgical History: Past Surgical History:  Procedure Laterality Date   COLONOSCOPY N/A 11/24/2012   Procedure: COLONOSCOPY;  Surgeon: Malissa Hippo, MD;  [facility];  Service: Endoscopy;  Laterality: N/A;  830-moved to 730 Ann notified pt   FLOOR OF MOUTH BIOPSY N/A 10/14/2021   Procedure: FLOOR OF MOUTH RESECTION;  Surgeon:  Christia Reading, MD;  Location: [facility] OR;  Service: ENT;  Laterality: N/A;   HEMORROIDECTOMY     KNEE ARTHROSCOPY WITH LATERAL MENISECTOMY Right 08/31/2017   Procedure: KNEE ARTHROSCOPY WITH LATERAL MENISECTOMY;  Surgeon: Vickki Hearing, MD;  Location: [facility] ORS;  Service: Orthopedics;  Laterality: Right;   LESION EXCISION N/A 03/23/2012   Procedure: EXCISION NEOPLASM SCALP ;  Surgeon: Dalia Heading, MD;  Location: [facility];  Service: General;  Laterality: N/A;  Excision of Scalp Neoplasm   ORCHIECTOMY Right 12/20/2012   Procedure: RIGHT RADICAL ORCHIECTOMY/POSSIBLE BX RIGHT TESTICLE;  Surgeon: Ky Barban, MD;  Location: [facility];  Service: Urology;  Laterality: Right;   PORTACATH PLACEMENT  Left 03/25/2022   Procedure: INSERTION PORT-A-CATH;  Surgeon: Franky Macho, MD;  Location: [facility];  Service: General;  Laterality: Left;   PROSTATE SURGERY     RADICAL NECK DISSECTION Bilateral 10/14/2021   Procedure: NECK DISSECTION;  Surgeon: Christia Reading, MD;  Location: [facility] OR;  Service: ENT;  Laterality: Bilateral;   SCALP LACERATION REPAIR     [facility]-Dr Katrinka Blazing   SKIN FULL THICKNESS GRAFT Bilateral 10/14/2021   Procedure: PLATYSMA FLAP CLOSURE;  Surgeon: Christia Reading, MD;  Location[facility] OR;  Service: ENT;  Laterality: Bilateral;   TOOTH EXTRACTION  10/14/2021   Procedure: DENTAL EXTRACTIONS;  Surgeon: Christia Reading, MD;  Location: [facility] OR;  Service: ENT;;   TRACHEOSTOMY TUBE PLACEMENT N/A 10/14/2021   Procedure: TRACHEOSTOMY;  Surgeon: Christia Reading, MD;  Location: [facility] OR;  Service: ENT;  Laterality: N/A;    Social History: Social History   Socioeconomic History   Marital status: Single    Spouse name: Not on file   Number of children: Not on file   Years of education: Not on file   Highest education level: Not on file  Occupational History   Not on file  Tobacco Use   Smoking status: Former    Current packs/day: 0.25    Average packs/day: 0.3 packs/day for 50.0 years (12.5 ttl pk-yrs)     Types: Cigarettes   Smokeless tobacco: Never  Vaping Use   Vaping status: Never Used  Substance and Sexual Activity   Alcohol use: Not Currently   Drug use: No   Sexual activity: Yes    Birth control/protection: None  Other Topics Concern   Not on file  Social History Narrative   Not on file   Social Drivers of Health   Financial Resource Strain: Not on file  Food Insecurity: Not on file  Transportation Needs: Not on file  Physical Activity: Not on file  Stress: Not on file  Social Connections: Not on file  Intimate Partner Violence: Not on file    Family History: Family History  Problem Relation Age of Onset   Cancer Brother    Cancer Brother     Current Medications:  Current Outpatient Medications:    acetaminophen (TYLENOL) 650 MG CR tablet, Take 650 mg by mouth every 8 (eight) hours as needed for pain., Disp: , Rfl:    amLODipine-olmesartan (AZOR) 5-20 MG tablet, TAKE 1 TABLET BY MOUTH DAILY, Disp: 90 tablet, Rfl: 0   budesonide-formoterol (SYMBICORT) 80-4.5 MCG/ACT inhaler, Inhale 2 puffs into the lungs in the morning and at bedtime., Disp: 1 each, Rfl: 12   Cholecalciferol (VITAMIN D3) 50 MCG (2000 UT) TABS, Take 2,000 Units by mouth daily., Disp: , Rfl:    ciprofloxacin-dexamethasone (CIPRODEX) OTIC suspension, Place 4 drops into the left ear 2 (two) times daily., Disp: , Rfl:    clindamycin (CLINDAGEL) 1 % gel, Apply topically 2 (two) times daily. Apply twice daily to chest rash, Disp: 30 g, Rfl: 0   doxycycline (VIBRA-TABS) 100 MG tablet, Take 1 tablet (100 mg total) by mouth 2 (two) times daily., Disp: 60 tablet, Rfl: 3   HYDROcodone-acetaminophen (HYCET) 7.5-325 mg/15 ml solution, Take 15 mLs by mouth every 6 (six) hours as needed for moderate pain (pain score 4-6)., Disp: 473 mL, Rfl: 0   lidocaine (XYLOCAINE) 2 % solution, Take by mouth., Disp: , Rfl:    magnesium oxide (MAG-OX) 400 MG tablet, Take 2 tablets (800 mg total) by mouth 2 (two) times daily., Disp:  120 tablet, Rfl:  3   Menthol, Topical Analgesic, (BIOFREEZE EX), Apply 1 application  topically daily as needed (pain)., Disp: , Rfl:    naloxone (NARCAN) nasal spray 4 mg/0.1 mL, SMARTSIG:Both Nares, Disp: , Rfl:    Polyethyl Glycol-Propyl Glycol (GOODSENSE LUBRICANT EYE DROPS OP), Place 1 drop into both eyes daily as needed (dry eyes)., Disp: , Rfl:    pravastatin (PRAVACHOL) 80 MG tablet, Take 80 mg by mouth daily., Disp: , Rfl:    predniSONE (DELTASONE) 10 MG tablet, Take  4 each am x 2 days,   2 each am x 2 days,  1 each am x 2 days and stop, Disp: 14 tablet, Rfl: 0   RAPAFLO 8 MG CAPS capsule, Take 8 mg by mouth daily. Silodosin, Disp: , Rfl:    vitamin B-12 (CYANOCOBALAMIN) 500 MCG tablet, Take 500 mcg by mouth daily., Disp: , Rfl:    Allergies: No Known Allergies  REVIEW OF SYSTEMS:   Review of Systems  Constitutional:  Negative for chills, fatigue and fever.  HENT:   Positive for trouble swallowing. Negative for lump/mass, mouth sores, nosebleeds and sore throat.   Eyes:  Negative for eye problems.  Respiratory:  Negative for cough and shortness of breath.   Cardiovascular:  Negative for chest pain, leg swelling and palpitations.  Gastrointestinal:  Negative for abdominal pain, constipation, diarrhea, nausea and vomiting.  Genitourinary:  Negative for bladder incontinence, difficulty urinating, dysuria, frequency, hematuria and nocturia.   Musculoskeletal:  Negative for arthralgias, back pain, flank pain, myalgias and neck pain.  Skin:  Negative for itching and rash.  Neurological:  Negative for dizziness, headaches and numbness.  Hematological:  Does not bruise/bleed easily.  Psychiatric/Behavioral:  Negative for depression, sleep disturbance and suicidal ideas. The patient is not nervous/anxious.   All other systems reviewed and are negative.    VITALS:   Height 5\' 11"  (1.803 m), weight 154 lb (69.9 kg).  Wt Readings from Last 3 Encounters:  02/04/23 154 lb (69.9 kg)   01/21/23 156 lb 6.4 oz (70.9 kg)  01/20/23 157 lb 3.2 oz (71.3 kg)    Body mass index is 21.48 kg/m.  Performance status (ECOG): 1 - Symptomatic but completely ambulatory  PHYSICAL EXAM:   Physical Exam Vitals and nursing note reviewed. Exam conducted with a chaperone present.  Constitutional:      Appearance: Normal appearance.  Cardiovascular:     Rate and Rhythm: Normal rate and regular rhythm.     Pulses: Normal pulses.     Heart sounds: Normal heart sounds.  Pulmonary:     Effort: Pulmonary effort is normal.     Breath sounds: Normal breath sounds.  Abdominal:     Palpations: Abdomen is soft. There is no hepatomegaly, splenomegaly or mass.     Tenderness: There is no abdominal tenderness.  Musculoskeletal:     Right lower leg: No edema.     Left lower leg: No edema.  Lymphadenopathy:     Cervical: No cervical adenopathy.     Right cervical: No superficial, deep or posterior cervical adenopathy.    Left cervical: No superficial, deep or posterior cervical adenopathy.     Upper Body:     Right upper body: No supraclavicular or axillary adenopathy.     Left upper body: No supraclavicular or axillary adenopathy.  Neurological:     General: No focal deficit present.     Mental Status: He is alert and oriented to person, place, and time.  Psychiatric:  Mood and Affect: Mood normal.        Behavior: Behavior normal.     LABS:      Latest Ref Rng & Units 02/04/2023    9:47 AM 01/21/2023    8:43 AM 01/14/2023    8:37 AM  CBC  WBC 4.0 - 10.5 K/uL 7.1  7.3  6.2   Hemoglobin 13.0 - 17.0 g/dL 95.6  21.3  08.6   Hematocrit 39.0 - 52.0 % 37.2  38.0  37.9   Platelets 150 - 400 K/uL 297  297  263       Latest Ref Rng & Units 02/04/2023    9:47 AM 01/21/2023    8:43 AM 01/14/2023    8:37 AM  CMP  Glucose 70 - 99 mg/dL 97  578  469   BUN 8 - 23 mg/dL 10  11  10    Creatinine 0.61 - 1.24 mg/dL 6.29  5.28  4.13   Sodium 135 - 145 mmol/L 136  135  137   Potassium 3.5 -  5.1 mmol/L 4.0  4.2  3.9   Chloride 98 - 111 mmol/L 102  101  102   CO2 22 - 32 mmol/L 26  25  26    Calcium 8.9 - 10.3 mg/dL 24.4  01.0  27.2   Total Protein 6.5 - 8.1 g/dL 6.5  7.2  7.2   Total Bilirubin 0.0 - 1.2 mg/dL 0.7  0.6  0.8   Alkaline Phos 38 - 126 U/L 50  47  45   AST 15 - 41 U/L 17  18  19    ALT 0 - 44 U/L 14  14  14       Lab Results  Component Value Date   CEA 2.2 01/19/2013   /  CEA  Date Value Ref Range Status  01/19/2013 2.2 0.0 - 5.0 ng/mL Final    Comment:    Performed at [facility]   No results found for: "PSA1" No results found for: "CAN199" No results found for: "CAN125"  No results found for: "TOTALPROTELP", "ALBUMINELP", "A1GS", "A2GS", "BETS", "BETA2SER", "GAMS", "MSPIKE", "SPEI" Lab Results  Component Value Date   TIBC 244 (L) 08/04/2022   TIBC 251 05/12/2022   FERRITIN 273 08/04/2022   FERRITIN 195 05/12/2022   IRONPCTSAT 19 08/04/2022   IRONPCTSAT 18 05/12/2022   Lab Results  Component Value Date   LDH 172 11/12/2020   LDH 175 10/26/2018   LDH 179 10/25/2013     STUDIES:   No results found.

## 2023-02-04 NOTE — Patient Instructions (Signed)
[facility] at [facility] Discharge Instructions   You were seen and examined today by Dr. Ellin Saba.  He reviewed the results of your lab work which are mostly normal/stable. Your magnesium is very low at 1.4. We will give you IV magnesium in the clinic today.   We will proceed with your treatment today.   Return as scheduled.    Thank you for choosing [facility] at [facility], [place] to provide your oncology and hematology care.  To afford each patient quality time with our provider, please arrive at least 15 minutes before your scheduled appointment time.   If you have a lab appointment with the Cancer Center please come in thru the Main Entrance and check in at the main information desk.  You need to re-schedule your appointment should you arrive 10 or more minutes late.  We strive to give you quality time with our providers, and arriving late affects you and other patients whose appointments are after yours.  Also, if you no show three or more times for appointments you may be dismissed from the clinic at the providers discretion.     Again, thank you for choosing [facility].  Our hope is that these requests will decrease the amount of time that you wait before being seen by our physicians.       _____________________________________________________________  Should you have questions after your visit to [facility], please contact our office at [phone] and follow the prompts.  Our office hours are 8:00 a.m. and 4:30 p.m. Monday - Friday.  Please note that voicemails left after 4:00 p.m. may not be returned until the following business day.  We are closed weekends and major holidays.  You do have access to a nurse 24-7, just call the main number to the clinic [phone] and do not press any options, hold on the line and a nurse will answer the phone.    For prescription refill requests, have your pharmacy contact  our office and allow 72 hours.    Due to Covid, you will need to wear a mask upon entering the hospital. If you do not have a mask, a mask will be given to you at the Main Entrance upon arrival. For doctor visits, patients may have 1 support person age 32 or older with them. For treatment visits, patients can not have anyone with them due to social distancing guidelines and our immunocompromised population.

## 2023-02-04 NOTE — Progress Notes (Signed)
Patient presents today for chemotherapy infusion. Patient is in satisfactory condition with no new complaints voiced.  Vital signs are stable.  Labs reviewed by Dr. Ellin Saba during the office visit and all labs are within treatment parameters.  Magnesium today is 1.4.  We will give Magnesium sulfate 4 grams IV x one dose today per standing orders by Dr. Ellin Saba.  We will proceed with treatment per MD orders.   Patient tolerated treatment well with no complaints voiced.  Patient left via wheelchair with sister, Cline Cools, in stable condition.  Vital signs stable at discharge.  Follow up as scheduled.

## 2023-02-08 ENCOUNTER — Ambulatory Visit (HOSPITAL_COMMUNITY): Payer: 59

## 2023-02-10 ENCOUNTER — Encounter: Payer: Self-pay | Admitting: Hematology

## 2023-02-11 ENCOUNTER — Other Ambulatory Visit (HOSPITAL_COMMUNITY): Payer: Self-pay | Admitting: Student

## 2023-02-11 ENCOUNTER — Inpatient Hospital Stay: Payer: 59

## 2023-02-11 ENCOUNTER — Encounter: Payer: Self-pay | Admitting: Hematology

## 2023-02-11 ENCOUNTER — Inpatient Hospital Stay: Payer: 59 | Admitting: Hematology

## 2023-02-11 DIAGNOSIS — K769 Liver disease, unspecified: Secondary | ICD-10-CM

## 2023-02-15 ENCOUNTER — Telehealth: Payer: Self-pay | Admitting: Radiation Oncology

## 2023-02-15 ENCOUNTER — Ambulatory Visit (HOSPITAL_COMMUNITY)
Admission: RE | Admit: 2023-02-15 | Discharge: 2023-02-15 | Disposition: A | Discharge status: Home / Self Care | Payer: 59 | Source: Ambulatory Visit | Attending: Radiation Oncology | Admitting: Radiation Oncology

## 2023-02-15 NOTE — Telephone Encounter (Signed)
2/3 @ 3:55 pm patient's sister called for patient to be reschedule for his CT Sim appointment after 2/18, due to he had to reschedule his CT Scans.  Email forward to Support RTT and CT Sim, so they are aware.

## 2023-02-15 NOTE — Telephone Encounter (Signed)
2/3 @ 10:54 am Follow up call to patient's sister after received voicemail.  No answer left voicemail.  Followed email to Support RTT/CT Sim.

## 2023-02-15 NOTE — Progress Notes (Incomplete)
Has armband been applied?  {yes no:314532}  Does patient have an allergy to IV contrast dye?: {yes no:314532}   Has patient ever received premedication for IV contrast dye?: {yes no:314532}   Does patient take metformin?: {yes no:314532}  If patient does take metformin when was the last dose: {Time; dates multiple:15870}  Date of lab work: 02/04/2023 BUN: 10 CR: 0.67 eGfr: >60  IV site: Left Chest Port  Has IV site been added to flowsheet?  {yes no:314532}  There were no vitals taken for this visit.

## 2023-02-17 ENCOUNTER — Telehealth: Payer: Self-pay | Admitting: Radiation Oncology

## 2023-02-17 NOTE — Telephone Encounter (Signed)
 2/5 @ 3:40 pm Received voicemail from patient's sister Lagretta.  Patient's Sim appointment for 2/19 need to be reschedule due to patient is in infusion clinic same day at [facility].  Email forward to Support RTT and CT sim, so they are aware.

## 2023-02-18 ENCOUNTER — Inpatient Hospital Stay: Payer: 59 | Attending: Hematology

## 2023-02-18 ENCOUNTER — Encounter: Payer: Self-pay | Admitting: Hematology

## 2023-02-18 ENCOUNTER — Inpatient Hospital Stay: Payer: 59 | Admitting: Hematology

## 2023-02-18 ENCOUNTER — Inpatient Hospital Stay: Payer: 59

## 2023-02-18 ENCOUNTER — Ambulatory Visit: Payer: 59

## 2023-02-18 ENCOUNTER — Ambulatory Visit: Payer: 59 | Admitting: Radiation Oncology

## 2023-02-18 VITALS — BP 134/70 | HR 80 | Temp 97.4°F | Resp 16 | Wt 153.1 lb

## 2023-02-18 VITALS — BP 126/62 | HR 99 | Temp 97.0°F | Resp 19

## 2023-02-18 DIAGNOSIS — Z5111 Encounter for antineoplastic chemotherapy: Secondary | ICD-10-CM | POA: Diagnosis not present

## 2023-02-18 DIAGNOSIS — C049 Malignant neoplasm of floor of mouth, unspecified: Secondary | ICD-10-CM | POA: Insufficient documentation

## 2023-02-18 DIAGNOSIS — C6211 Malignant neoplasm of descended right testis: Secondary | ICD-10-CM

## 2023-02-18 DIAGNOSIS — C787 Secondary malignant neoplasm of liver and intrahepatic bile duct: Secondary | ICD-10-CM | POA: Insufficient documentation

## 2023-02-18 DIAGNOSIS — Z79899 Other long term (current) drug therapy: Secondary | ICD-10-CM | POA: Insufficient documentation

## 2023-02-18 DIAGNOSIS — R21 Rash and other nonspecific skin eruption: Secondary | ICD-10-CM | POA: Diagnosis not present

## 2023-02-18 DIAGNOSIS — Z87891 Personal history of nicotine dependence: Secondary | ICD-10-CM | POA: Diagnosis not present

## 2023-02-18 DIAGNOSIS — I959 Hypotension, unspecified: Secondary | ICD-10-CM | POA: Diagnosis not present

## 2023-02-18 LAB — CBC WITH DIFFERENTIAL/PLATELET
Abs Immature Granulocytes: 0.03 10*3/uL (ref 0.00–0.07)
Basophils Absolute: 0.1 10*3/uL (ref 0.0–0.1)
Basophils Relative: 1 %
Eosinophils Absolute: 0.4 10*3/uL (ref 0.0–0.5)
Eosinophils Relative: 4 %
HCT: 36.2 % — ABNORMAL LOW (ref 39.0–52.0)
Hemoglobin: 11.9 g/dL — ABNORMAL LOW (ref 13.0–17.0)
Immature Granulocytes: 0 %
Lymphocytes Relative: 19 %
Lymphs Abs: 1.6 10*3/uL (ref 0.7–4.0)
MCH: 33.3 pg (ref 26.0–34.0)
MCHC: 32.9 g/dL (ref 30.0–36.0)
MCV: 101.4 fL — ABNORMAL HIGH (ref 80.0–100.0)
Monocytes Absolute: 0.9 10*3/uL (ref 0.1–1.0)
Monocytes Relative: 10 %
Neutro Abs: 5.6 10*3/uL (ref 1.7–7.7)
Neutrophils Relative %: 66 %
Platelets: 285 10*3/uL (ref 150–400)
RBC: 3.57 MIL/uL — ABNORMAL LOW (ref 4.22–5.81)
RDW: 12.9 % (ref 11.5–15.5)
WBC: 8.6 10*3/uL (ref 4.0–10.5)
nRBC: 0 % (ref 0.0–0.2)

## 2023-02-18 LAB — COMPREHENSIVE METABOLIC PANEL
ALT: 15 U/L (ref 0–44)
AST: 18 U/L (ref 15–41)
Albumin: 3.2 g/dL — ABNORMAL LOW (ref 3.5–5.0)
Alkaline Phosphatase: 47 U/L (ref 38–126)
Anion gap: 10 (ref 5–15)
BUN: 11 mg/dL (ref 8–23)
CO2: 26 mmol/L (ref 22–32)
Calcium: 10.3 mg/dL (ref 8.9–10.3)
Chloride: 103 mmol/L (ref 98–111)
Creatinine, Ser: 0.65 mg/dL (ref 0.61–1.24)
GFR, Estimated: >60 mL/min (ref >60)
Glucose, Bld: 99 mg/dL (ref 70–99)
Potassium: 4 mmol/L (ref 3.5–5.1)
Sodium: 139 mmol/L (ref 135–145)
Total Bilirubin: 0.6 mg/dL (ref 0.0–1.2)
Total Protein: 7.1 g/dL (ref 6.5–8.1)

## 2023-02-18 LAB — MAGNESIUM: Magnesium: 1.3 mg/dL — ABNORMAL LOW (ref 1.7–2.4)

## 2023-02-18 MED ORDER — HEPARIN SOD (PORK) LOCK FLUSH 100 UNIT/ML IV SOLN
500.0000 [IU] | Freq: Once | INTRAVENOUS | Status: AC | PRN
  Administered 2023-02-18: 500 [IU]

## 2023-02-18 MED ORDER — SODIUM CHLORIDE 0.9 % IV SOLN: Freq: Once | INTRAVENOUS | Status: AC

## 2023-02-18 MED ORDER — METHYLPREDNISOLONE SODIUM SUCC 125 MG IJ SOLR
125.0000 mg | Freq: Once | INTRAMUSCULAR | Status: AC
  Administered 2023-02-18: 125 mg via INTRAVENOUS
  Filled 2023-02-18: qty 2

## 2023-02-18 MED ORDER — DIPHENHYDRAMINE HCL 50 MG/ML IJ SOLN
50.0000 mg | Freq: Once | INTRAMUSCULAR | Status: AC
Start: 2023-02-18 — End: 2023-02-18
  Administered 2023-02-18: 50 mg via INTRAVENOUS
  Filled 2023-02-18: qty 1

## 2023-02-18 MED ORDER — SODIUM CHLORIDE 0.9% FLUSH
10.0000 mL | INTRAVENOUS | Status: DC | PRN
  Administered 2023-02-18: 10 mL via INTRAVENOUS

## 2023-02-18 MED ORDER — CETUXIMAB CHEMO IV INJECTION 200 MG/100ML
500.0000 mg/m2 | Freq: Once | INTRAVENOUS | Status: AC
  Administered 2023-02-18: 900 mg via INTRAVENOUS
  Filled 2023-02-18: qty 400

## 2023-02-18 MED ORDER — FAMOTIDINE IN NACL 20-0.9 MG/50ML-% IV SOLN
20.0000 mg | Freq: Once | INTRAVENOUS | Status: AC
  Administered 2023-02-18: 20 mg via INTRAVENOUS
  Filled 2023-02-18: qty 50

## 2023-02-18 MED ORDER — MAGNESIUM SULFATE 4 GM/100ML IV SOLN
4.0000 g | Freq: Once | INTRAVENOUS | Status: AC
  Administered 2023-02-18: 4 g via INTRAVENOUS
  Filled 2023-02-18: qty 100

## 2023-02-18 NOTE — Patient Instructions (Signed)
 [facility] [place] - A DEPT OF [facility]  Discharge Instructions: Thank you for choosing [facility] to provide your oncology and hematology care.  If you have a lab appointment with the Cancer Center - please note that after April 8th, 2024, all labs will be drawn in the cancer center.  You do not have to check in or register with the main entrance as you have in the past but will complete your check-in in the cancer center.  Wear comfortable clothing and clothing appropriate for easy access to any Portacath or PICC line.   We strive to give you quality time with your provider. You may need to reschedule your appointment if you arrive late (15 or more minutes).  Arriving late affects you and other patients whose appointments are after yours.  Also, if you miss three or more appointments without notifying the office, you may be dismissed from the clinic at the provider's discretion.      For prescription refill requests, have your pharmacy contact our office and allow 72 hours for refills to be completed.    Today you received the following chemotherapy and/or immunotherapy agents erbitux      To help prevent nausea and vomiting after your treatment, we encourage you to take your nausea medication as directed.  BELOW ARE SYMPTOMS THAT SHOULD BE REPORTED IMMEDIATELY: *FEVER GREATER THAN 100.4 F (38 C) OR HIGHER *CHILLS OR SWEATING *NAUSEA AND VOMITING THAT IS NOT CONTROLLED WITH YOUR NAUSEA MEDICATION *UNUSUAL SHORTNESS OF BREATH *UNUSUAL BRUISING OR BLEEDING *URINARY PROBLEMS (pain or burning when urinating, or frequent urination) *BOWEL PROBLEMS (unusual diarrhea, constipation, pain near the anus) TENDERNESS IN MOUTH AND THROAT WITH OR WITHOUT PRESENCE OF ULCERS (sore throat, sores in mouth, or a toothache) UNUSUAL RASH, SWELLING OR PAIN  UNUSUAL VAGINAL DISCHARGE OR ITCHING   Items with * indicate a potential emergency and should be followed up  as soon as possible or go to the Emergency Department if any problems should occur.  Please show the CHEMOTHERAPY ALERT CARD or IMMUNOTHERAPY ALERT CARD at check-in to the Emergency Department and triage nurse.  Should you have questions after your visit or need to cancel or reschedule your appointment, please contact [facility] [place] - A DEPT OF [facility] [phone]  and follow the prompts.  Office hours are 8:00 a.m. to 4:30 p.m. Monday - Friday. Please note that voicemails left after 4:00 p.m. may not be returned until the following business day.  We are closed weekends and major holidays. You have access to a nurse at all times for urgent questions. Please call the main number to the clinic [phone] and follow the prompts.  For any non-urgent questions, you may also contact your provider using MyChart. We now offer e-Visits for anyone 7 and older to request care online for non-urgent symptoms. For details visit [email or web address].   Also download the MyChart app! Go to the app store, search "MyChart", open the app, select , and log in with your MyChart username and password.

## 2023-02-18 NOTE — Progress Notes (Signed)
 Magnesium  1.3. per Dr Charmain standing orders to give pt Mag 4g over 2 hours. Pt and treatment team updated.   Patient tolerated chemotherapy with no complaints voiced.  Side effects with management reviewed with understanding verbalized.  Port site clean and dry with no bruising or swelling noted at site.  Good blood return noted before and after administration of chemotherapy.  Band aid applied.  Patient left in satisfactory condition with VSS and no s/s of distress noted. Pt escorted via wheelchair to be driven home by sister. All follow ups as scheduled.   Nathaniel Hicks

## 2023-02-19 ENCOUNTER — Ambulatory Visit: Payer: 59 | Admitting: Radiation Oncology

## 2023-02-19 ENCOUNTER — Other Ambulatory Visit: Payer: Self-pay

## 2023-02-19 ENCOUNTER — Ambulatory Visit: Payer: 59

## 2023-03-01 ENCOUNTER — Other Ambulatory Visit: Payer: Self-pay | Admitting: Student

## 2023-03-01 ENCOUNTER — Other Ambulatory Visit (HOSPITAL_COMMUNITY): Payer: Self-pay | Admitting: Radiology

## 2023-03-01 DIAGNOSIS — K769 Liver disease, unspecified: Secondary | ICD-10-CM

## 2023-03-01 NOTE — H&P (Incomplete)
 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 Patient is currently followed by Nathaniel Hicks with oncology. Patient had a repeat PET scan 12/24/22, which reported hypermetabolic 4.6 cm right liver dome metastasis which increase in size. This lesion was also noted on PET scan 07/2021, but was 2.4 cm at the time. MRI liver 12/2022 reported a right hepatic lobe lesion, which increased in size from MRI 1 year prior. The lesion has peripheral enhancement and is hypermetabolic on recent PET scan. Patient was referred for fiducial marker placement.   Past Medical History:  Diagnosis Date   Arthritis    BPH (benign prostatic hyperplasia)    Difficult intubation    Nathaniel (hard of Hicks)    Hyperlipidemia 11/02/2017   Hypertension    Hypertension 11/02/2017   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HCC)    2014   Vitamin D deficiency 11/02/2017    Past Surgical History:  Procedure Laterality Date   COLONOSCOPY N/A 11/24/2012   Procedure: COLONOSCOPY;  Surgeon: Nathaniel Hippo, Hicks;  Location[facility];  Service: Endoscopy;  Laterality: N/A;  830-moved to 730 Ann notified pt   FLOOR OF MOUTH BIOPSY N/A 10/14/2021   Procedure: FLOOR OF MOUTH RESECTION;  Surgeon: Nathaniel Reading, Hicks;  [facility];  Service: ENT;  Laterality: N/A;   HEMORROIDECTOMY     KNEE ARTHROSCOPY WITH LATERAL MENISECTOMY Right 08/31/2017   Procedure: KNEE ARTHROSCOPY WITH LATERAL MENISECTOMY;  Surgeon: Nathaniel Hearing, Hicks;  Location: [facility] ORS;  Service: Orthopedics;  Laterality: Right;    LESION EXCISION N/A 03/23/2012   Procedure: EXCISION NEOPLASM SCALP ;  Surgeon: Nathaniel Heading, Hicks;  Location: [facility] ORS;  Service: General;  Laterality: N/A;  Excision of Scalp Neoplasm   ORCHIECTOMY Right 12/20/2012   Procedure: RIGHT RADICAL ORCHIECTOMY/POSSIBLE BX RIGHT TESTICLE;  Surgeon: Nathaniel Barban, Hicks;  Location: [facility] ORS;  Service: Urology;  Laterality: Right;   PORTACATH PLACEMENT Left 03/25/2022   Procedure: INSERTION PORT-A-CATH;  Surgeon: Nathaniel Macho, Hicks;  Location: [facility] ORS;  Service: General;  Laterality: Left;   PROSTATE SURGERY     RADICAL NECK DISSECTION Bilateral 10/14/2021   Procedure: NECK DISSECTION;  Surgeon: Nathaniel Reading, Hicks;  Location: [facility] OR;  Service: ENT;  Laterality: Bilateral;   SCALP LACERATION REPAIR     [facility]-Dr Nathaniel Hicks   SKIN FULL THICKNESS GRAFT Bilateral 10/14/2021   Procedure: PLATYSMA FLAP CLOSURE;  Surgeon: Nathaniel Reading, Hicks;  Location: [facility] OR;  Service: ENT;  Laterality: Bilateral;   TOOTH EXTRACTION  10/14/2021   Procedure: DENTAL EXTRACTIONS;  Surgeon: Nathaniel Reading, Hicks;  Location: [facility] OR;  Service: ENT;;   TRACHEOSTOMY TUBE PLACEMENT N/A 10/14/2021   Procedure: TRACHEOSTOMY;  Surgeon: Nathaniel Reading, Hicks;  Location: [facility] OR;  Service: ENT;  Laterality: N/A;    Allergies: Patient has no known allergies.  Medications: Prior to Admission medications   Medication Sig Start Date End Date Taking? Authorizing Provider  acetaminophen (TYLENOL) 650 MG CR tablet Take 650 mg by mouth every 8 (eight) hours as needed for pain.    Nathaniel Hicks  amLODipine-olmesartan (AZOR) 5-20 MG tablet TAKE  1 TABLET BY MOUTH DAILY 09/22/22   Nathaniel Cowden, Hicks  budesonide-formoterol [facility]) 80-4.5 MCG/ACT inhaler Inhale 2 puffs into the lungs in the morning and at bedtime. 05/29/22   Kommor, Nathaniel Hicks  Cholecalciferol (VITAMIN D3) 50 MCG (2000 UT) TABS Take 2,000 Units by mouth daily.    Nathaniel Hicks  ciprofloxacin-dexamethasone (CIPRODEX) OTIC  suspension Place 4 drops into the left ear 2 (two) times daily. 11/18/22   Nathaniel Hicks  clindamycin (CLINDAGEL) 1 % gel Apply topically 2 (two) times daily. Apply twice daily to chest rash 10/26/22   Nathaniel Massed, Hicks  doxycycline (VIBRA-TABS) 100 MG tablet Take 1 tablet (100 mg total) by mouth 2 (two) times daily. 09/28/22   Nathaniel Massed, Hicks  HYDROcodone-acetaminophen (HYCET) 7.5-325 mg/15 ml solution Take 15 mLs by mouth every 6 (six) hours as needed for moderate pain (pain score 4-6). 01/25/23   Nathaniel Massed, Hicks  lidocaine (XYLOCAINE) 2 % solution Take by mouth. 07/30/22   Nathaniel Hicks  magnesium oxide (MAG-OX) 400 MG tablet Take 2 tablets (800 mg total) by mouth 2 (two) times daily. 01/14/23   Nathaniel Massed, Hicks  Menthol, Topical Analgesic, (BIOFREEZE EX) Apply 1 application  topically daily as needed (pain).    Nathaniel Hicks  naloxone [facility]) nasal spray 4 mg/0.1 mL SMARTSIG:Both Nares 04/11/22   Nathaniel Hicks  Polyethyl Glycol-Propyl Glycol (GOODSENSE LUBRICANT EYE DROPS OP) Place 1 drop into both eyes daily as needed (dry eyes).    Nathaniel Hicks  pravastatin (PRAVACHOL) 80 MG tablet Take 80 mg by mouth daily.    Nathaniel Hicks  predniSONE (DELTASONE) 10 MG tablet Take  4 each am x 2 days,   2 each am x 2 days,  1 each am x 2 days and stop 08/07/22   Nathaniel Cowden, Hicks  RAPAFLO 8 MG CAPS capsule Take 8 mg by mouth daily. Silodosin 10/13/13   Nathaniel Hicks  vitamin B-12 (CYANOCOBALAMIN) 500 MCG tablet Take 500 mcg by mouth daily.    Nathaniel Hicks     Family History  Problem Relation Age of Onset   Cancer Brother    Cancer Brother     Social History   Socioeconomic History   Marital status: Single    Spouse name: Not on file   Number of children: Not on file   Years of education: Not on file   Highest education level: Not on file  Occupational History   Not on file   Tobacco Use   Smoking status: Former    Current packs/day: 0.25    Average packs/day: 0.3 packs/day for 50.0 years (12.5 ttl pk-yrs)    Types: Cigarettes   Smokeless tobacco: Never  Vaping Use   Vaping status: Never Used  Substance and Sexual Activity   Alcohol use: Not Currently   Drug use: No   Sexual activity: Yes    Birth control/protection: None  Other Topics Concern   Not on file  Social History Narrative   Not on file   Social Drivers of Health   Financial Resource Strain: Not on file  Food Insecurity: Not on file  Transportation Needs: Not on file  Physical Activity: Not on file  Stress: Not on file  Social Connections: Not on file    ECOG Status: {CHL ONC ECOG ZO:[id number]}  Review of Systems: A 12 point ROS discussed and pertinent positives are indicated in the HPI above.  All other systems are negative.  Review of Systems  Vital Signs: There were no vitals taken for this visit.  Advance Care Plan: {Advance Care WUJW:11914}    Physical Exam  Imaging: No results found.  Labs:  CBC: Recent Labs    01/14/23 0837 01/21/23 0843 02/04/23 0947 02/18/23 0952  WBC 6.2 7.3 7.1 8.6  HGB 12.3* 12.4* 12.1* 11.9*  HCT 37.9* 38.0* 37.2* 36.2*  PLT 263 297 297 285    COAGS: No results for input(s): "INR", "APTT" in the last 8760 hours.  BMP: Recent Labs    01/14/23 0837 01/21/23 0843 02/04/23 0947 02/18/23 0952  NA 137 135 136 139  K 3.9 4.2 4.0 4.0  CL 102 101 102 103  CO2 26 25 26 26   GLUCOSE 107* 107* 97 99  BUN 10 11 10 11   CALCIUM 10.2 10.1 10.0 10.3  CREATININE 0.78 0.65 0.67 0.65  GFRNONAA >60 >60 >60 >60    LIVER FUNCTION TESTS: Recent Labs    01/14/23 0837 01/21/23 0843 02/04/23 0947 02/18/23 0952  BILITOT 0.8 0.6 0.7 0.6  AST 19 18 17 18   ALT 14 14 14 15   ALKPHOS 45 47 50 47  PROT 7.2 7.2 6.5 7.1  ALBUMIN 3.4* 3.3* 3.2* 3.2*    TUMOR MARKERS: No results for input(s): "AFPTM", "CEA", "CA199", "CHROMGRNA" in the  last 8760 hours.  Assessment and Plan:  ***  Thank you for this interesting consult.  I greatly enjoyed meeting Nathaniel Hicks and look forward to participating in their care.  A copy of this report was sent to the requesting provider on this date.  Electronically Signed: Rosalita Levan, PA 03/01/2023, 12:45 PM   I spent a total of {New INPT:304952001} {New Out-Pt:304952002}  {Established Out-Pt:304952003} in face to face in clinical consultation, greater than 50% of which was counseling/coordinating care for ***

## 2023-03-02 ENCOUNTER — Other Ambulatory Visit: Payer: Self-pay | Admitting: Radiation Oncology

## 2023-03-02 ENCOUNTER — Ambulatory Visit (HOSPITAL_COMMUNITY)
Admission: RE | Admit: 2023-03-02 | Discharge: 2023-03-02 | Disposition: A | Discharge status: Home / Self Care | Payer: 59 | Source: Ambulatory Visit | Attending: Radiation Oncology | Admitting: Radiation Oncology

## 2023-03-02 ENCOUNTER — Ambulatory Visit: Payer: 59 | Admitting: Radiation Oncology

## 2023-03-02 ENCOUNTER — Other Ambulatory Visit: Payer: Self-pay

## 2023-03-02 DIAGNOSIS — Z87891 Personal history of nicotine dependence: Secondary | ICD-10-CM | POA: Diagnosis not present

## 2023-03-02 DIAGNOSIS — C069 Malignant neoplasm of mouth, unspecified: Secondary | ICD-10-CM | POA: Diagnosis not present

## 2023-03-02 DIAGNOSIS — C787 Secondary malignant neoplasm of liver and intrahepatic bile duct: Secondary | ICD-10-CM | POA: Insufficient documentation

## 2023-03-02 DIAGNOSIS — C01 Malignant neoplasm of base of tongue: Secondary | ICD-10-CM

## 2023-03-02 DIAGNOSIS — K769 Liver disease, unspecified: Secondary | ICD-10-CM

## 2023-03-02 LAB — CBC
HCT: 34.9 % — ABNORMAL LOW (ref 39.0–52.0)
Hemoglobin: 11.3 g/dL — ABNORMAL LOW (ref 13.0–17.0)
MCH: 32.7 pg (ref 26.0–34.0)
MCHC: 32.4 g/dL (ref 30.0–36.0)
MCV: 100.9 fL — ABNORMAL HIGH (ref 80.0–100.0)
Platelets: 343 10*3/uL (ref 150–400)
RBC: 3.46 MIL/uL — ABNORMAL LOW (ref 4.22–5.81)
RDW: 13.2 % (ref 11.5–15.5)
WBC: 8.7 10*3/uL (ref 4.0–10.5)
nRBC: 0 % (ref 0.0–0.2)

## 2023-03-02 LAB — PROTIME-INR
INR: 1 (ref 0.8–1.2)
Prothrombin Time: 13 s (ref 11.4–15.2)

## 2023-03-02 MED ORDER — FENTANYL CITRATE (PF) 100 MCG/2ML IJ SOLN
INTRAMUSCULAR | Status: AC
  Filled 2023-03-02: qty 2

## 2023-03-02 MED ORDER — FENTANYL CITRATE (PF) 100 MCG/2ML IJ SOLN
INTRAMUSCULAR | Status: AC | PRN
  Administered 2023-03-02 (×3): 25 ug via INTRAVENOUS

## 2023-03-02 MED ORDER — DIPHENHYDRAMINE HCL 50 MG/ML IJ SOLN
INTRAMUSCULAR | Status: AC | PRN
  Administered 2023-03-02: 25 mg via INTRAVENOUS

## 2023-03-02 MED ORDER — DIPHENHYDRAMINE HCL 50 MG/ML IJ SOLN
INTRAMUSCULAR | Status: AC
  Filled 2023-03-02: qty 1

## 2023-03-02 MED ORDER — MIDAZOLAM HCL 2 MG/2ML IJ SOLN
INTRAMUSCULAR | Status: AC
  Filled 2023-03-02: qty 2

## 2023-03-02 MED ORDER — MIDAZOLAM HCL 2 MG/2ML IJ SOLN
INTRAMUSCULAR | Status: AC | PRN
  Administered 2023-03-02: 1 mg via INTRAVENOUS

## 2023-03-02 MED ORDER — HYDROCODONE-ACETAMINOPHEN 5-325 MG PO TABS: 1.0000 | ORAL_TABLET | ORAL | Status: DC | PRN

## 2023-03-02 MED ORDER — LIDOCAINE HCL 1 % IJ SOLN
10.0000 mL | Freq: Once | INTRAMUSCULAR | Status: AC
  Administered 2023-03-02: 10 mL via INTRADERMAL

## 2023-03-02 NOTE — Progress Notes (Signed)
 [facility] [address], [place] [address]    Clinic Day:  03/03/2023  Referring physician: [name], MD  Patient Care Team: [name], MD as PCP - General (Internal Medicine) [name], MD as Consulting Physician (Urology) [name], MD as Medical Oncologist (Medical Oncology) [name], RN as Oncology Nurse Navigator (Medical Oncology)   ASSESSMENT & PLAN:   Assessment: 1.  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  Enlarged contralateral right submandibular node.  Enlargement of the left submandibular gland probably reactive.  No abscess. - Biopsy (07/18/2021) floor of the mouth: Invasive well to moderately differentiated keratinizing squamous cell carcinoma.  Tumor cells negative for p16. - 10/14/2021: Floor of the mouth resection, tracheostomy, bilateral selective neck dissections zones 1-3 by Dr. [name] - Pathology: Invasive moderately differentiated keratinizing SCC, 4.1 cm, carcinoma invades for a depth of about 1.9 cm and involves saliva gland tissue.  Anterior, posterior, right, left resection margins are involved.  Deep resection margin is negative.  Metastatic carcinoma 1/3 level 1 lymph nodes.  3 level 2 and 3 level 3 lymph nodes negative for carcinoma.  0/10 lymph nodes involved in the left side neck zone 1, 2, 3 dissection.  ENE not identified.  LVI/perineural invasion not identified. - I have talked to pathologist.  Reexcision margins were considered negative.  He has a high risk feature which is T4 tumor. -  PET scan on 12/12/2021: Soft tissue thickening and calcification along the ventral aspect of the trachea with associated hypermetabolism corresponding to recent tracheostomy site.  7 mm right  paratracheal lymph node new with SUV 2.4.  Hypermetabolism along the dome of the right hepatic lobe with probable 2.4 cm low-attenuation lesion - Liver lesion biopsy (01/28/2022): Metastatic squamous cell carcinoma, keratinizing. - NGS testing: PD-L1 (22 C3): CPS: 100, CD274 (PD-L1) amplified, JAK2 amplified, PIK3CA pathogenic variant exon 10, T p53 pathogenic variant, MS-stable, TMB-low - Per Dr. [name], he also has local recurrence of disease in the floor of the mouth. - Keytruda from 03/10/2022 through 08/24/2022 with progression - XRT to the floor of the mouth on 06/29/2022 - PET scan 09/10/2022: Liver lesion increased in size measures 3.1 x 2.9 cm, previously 2.6 x 1.7 cm.  Right cervical lymph node meta stasis, mildly progressive. - HER2 by IHC 0 on biopsy from 01/28/2022. - Weekly cetuximab started on 10/07/2022 - PET scan (12/24/2022): Hypermetabolic anterior floor of mouth mass stable.  Stable right level 1B nodal metastasis.  Hypermetabolic 4.6 cm right liver dome metastasis increased in size by 8 mm.  No new sites of hypermetabolic metastatic disease. - MRI liver (01/07/2023): Right hepatic liver lesion measures 4.8 x 3.3 cm.  No new hepatic lesions seen.   2.  Social/family history: - He lives in his apartment by himself and is independent of ADLs and IADLs.  He does not drive.  He worked as a Curator and several other jobs.  Quit smoking 1 month ago.  He smoked 1 pack/week for more than 50 years. - 1 brother had agent orange related cancer.  Another brother also had cancer, type unknown to the patient.   3.  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 - He was on close surveillance rather  than adjuvant chemotherapy.    Plan: 1.  Stage IV (PT4PN1 M1) SCC of the floor of the mouth, p16 negative: - He had liver mass fiducial marker placement on 03/02/2023.  He will likely start radiation therapy tomorrow. - He is tolerating cetuximab reasonably well.  He  reported some decrease in energy levels.  We have checked his TSH which was 1.5 today. - Labs today: LFTs are normal.  CBC grossly normal. - He may proceed with cetuximab today.  RTC 2 weeks for follow-up.   2.  Oral pain: - Continue liquid hydrocodone twice daily as needed.  Pain is well-controlled.   3.  Cetuximab skin rash: - Continue doxycycline 100 mg twice daily and clindamycin gel twice daily.  No worsening of rash.   4.  Hypotension: - Continue to hold antihypertensives.  Blood pressure is 122/79 today.   5.  Hypomagnesemia: - He is taking magnesium 1 tablet twice daily.  Magnesium is low at 1.1 today.  He will receive IV magnesium.  He was told to increase magnesium to 2 tablets twice daily.    Orders Placed This Encounter  Procedures   CBC with Differential    Standing Status:   Future    Expected Date:   03/17/2023    Expiration Date:   03/16/2024   Comprehensive metabolic panel    Standing Status:   Future    Expected Date:   03/17/2023    Expiration Date:   03/16/2024   Magnesium    Standing Status:   Future    Expected Date:   03/17/2023    Expiration Date:   03/16/2024   CBC with Differential    Standing Status:   Future    Expected Date:   03/31/2023    Expiration Date:   03/30/2024   Comprehensive metabolic panel    Standing Status:   Future    Expected Date:   03/31/2023    Expiration Date:   03/30/2024   Magnesium    Standing Status:   Future    Expected Date:   03/31/2023    Expiration Date:   03/30/2024   CBC with Differential    Standing Status:   Future    Expected Date:   04/14/2023    Expiration Date:   04/13/2024   Comprehensive metabolic panel    Standing Status:   Future    Expected Date:   04/14/2023    Expiration Date:   04/13/2024   Magnesium    Standing Status:   Future    Expected Date:   04/14/2023    Expiration Date:   04/13/2024   TSH      I,[name],acting as a scribe for [name], MD.,have documented all relevant documentation on  the behalf of [name], MD,as directed by  [name], MD while in the presence of [name], MD.   I, [name] MD, have reviewed the above documentation for accuracy and completeness, and I agree with the above.   [name], MD   2/19/202512:14 PM  CHIEF COMPLAINT:   Diagnosis: squamous cell carcinoma of the floor of the mouth and testicular seminoma    Cancer Staging  Cancer of floor of mouth [facility]) Staging form: Oral Cavity, AJCC 8th Edition - Clinical stage from 11/25/2021: Stage IVC (cT4a, cN1, pM1) - Signed by [name], MD on 02/04/2022  Testicle cancer, right Staging form: Testis, AJCC 7th Edition - Clinical: Stage I (T2, N0, M0) - Signed by Dellis Anes  S, PA-C on 07/23/2013    Prior Therapy: 1. Pembrolizumab, 03/10/22 - 08/24/22 2. XRT to floor of mouth, 06/17/22 - 08/19/22  Current Therapy:  Cetuximab weekly    HISTORY OF PRESENT ILLNESS:   Oncology History  Testicle cancer, right  12/20/2012 Surgery   Right total orchiectomyt- 1. Testis, biopsy, right - SEMINOMA. PLEASE SEE COMMENT. 2. Testis, tumor, right - SEMINOMA, 7.5 CM. - ANGIOLYMPHATIC INVASION PRESENT. - RESECTION MARGINS, NEGATIVE FOR ATYPIA OR MALIGNANCY.   01/18/2013 PET scan   Status post right orchiectomy. No findings specific for metastatic disease. Small para-aortic nodes measuring up to 5 mm short axis, without convincing hypermetabolism. Given location, attention on follow-up is suggested.   04/17/2013 Imaging   CT CAP- No evidence of metastatic disease in the chest, abdomen or pelvis. Proximal LAD coronary artery calcification.   07/20/2013 Imaging   CT abd/pelvis- No evidence of metastatic disease in the abdomen or pelvis.   10/24/2013 Imaging   CT abd/pelvis- No findings to suggest metastatic disease in the abdomen or pelvis   10/24/2013 Imaging   Chest xray- Probable COPD.  Negative for metastatic disease.   02/21/2014 Imaging   CT  abd/pelvis- Stable abdominal pelvic CT status post right orchectomy. No evidence of adenopathy or other metastatic disease.   02/21/2014 Imaging   Chest xray- Left lower lobe mild atelectasis and/or infiltrate.     09/10/2014 Imaging   CT abd/pelvis- Status post right orchiectomy.   No evidence of metastatic disease.   3 mm nonobstructing right upper pole renal calculus. No hydronephrosis.   03/14/2015 Imaging   CT abd/pelvis- Stable exam. No evidence of metastatic disease or other acute findings within the abdomen or pelvis.   09/13/2015 Imaging   CT abd/pelvis- No acute findings and no evidence for mass or adenopathy.    04/24/2016 Imaging   CT abd/pelvis: IMPRESSION: 1. Stable exam. No new or progressive findings. No features to suggest metastatic disease.   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HCC)  10/14/2021 Initial Diagnosis   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HCC)   11/25/2021 Cancer Staging   Staging form: Oral Cavity, AJCC 8th Edition - Clinical stage from 11/25/2021: Stage IVC (cT4a, cN1, pM1) - Signed by [name], MD on 02/04/2022 Histopathologic type: Squamous cell carcinoma, NOS Stage prefix: Initial diagnosis Histologic grade (G): G2 Histologic grading system: 3 grade system   03/10/2022 - 08/24/2022 Chemotherapy   Patient is on Treatment Plan : HEAD/NECK Pembrolizumab (200) q21d     10/07/2022 -  Chemotherapy   Patient is on Treatment Plan : HEAD/NECK Cetuximab q7d        INTERVAL HISTORY:   Laurance is a 83 y.o. male presenting to clinic today for follow up of squamous cell carcinoma of the floor of the mouth and testicular seminoma. He was last seen by me on 02/04/23.  Since his last visit, he had fiducial markers placed at the liver lesion yesterday, 03/02/23, in anticipation of beginning SBRT on 03/16/23.  Today, he states that he is doing well overall. His appetite level is at 100%. His energy level is at 50%.  PAST MEDICAL HISTORY:   Past Medical History: Past Medical  History:  Diagnosis Date   Arthritis    BPH (benign prostatic hyperplasia)    Difficult intubation    HOH (hard of hearing)    Hyperlipidemia 11/02/2017   Hypertension    Hypertension 11/02/2017   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HCC)    2014   Vitamin D deficiency 11/02/2017  Surgical History: Past Surgical History:  Procedure Laterality Date   COLONOSCOPY N/A 11/24/2012   Procedure: COLONOSCOPY;  Surgeon: Malissa Hippo, MD;  Location: [facility];  Service: Endoscopy;  Laterality: N/A;  830-moved to 730 Ann notified pt   FLOOR OF MOUTH BIOPSY N/A 10/14/2021   Procedure: FLOOR OF MOUTH RESECTION;  Surgeon: Christia Reading, MD;  Location: [facility] OR;  Service: ENT;  Laterality: N/A;   HEMORROIDECTOMY     KNEE ARTHROSCOPY WITH LATERAL MENISECTOMY Right 08/31/2017   Procedure: KNEE ARTHROSCOPY WITH LATERAL MENISECTOMY;  Surgeon: Vickki Hearing, MD;  Location: [facility] ORS;  Service: Orthopedics;  Laterality: Right;   LESION EXCISION N/A 03/23/2012   Procedure: EXCISION NEOPLASM SCALP ;  Surgeon: Dalia Heading, MD;  Location: [facility];  Service: General;  Laterality: N/A;  Excision of Scalp Neoplasm   ORCHIECTOMY Right 12/20/2012   Procedure: RIGHT RADICAL ORCHIECTOMY/POSSIBLE BX RIGHT TESTICLE;  Surgeon: Ky Barban, MD;  Location: [facility];  Service: Urology;  Laterality: Right;   PORTACATH PLACEMENT Left 03/25/2022   Procedure: INSERTION PORT-A-CATH;  Surgeon: Franky Macho, MD;  Location: [facility];  Service: General;  Laterality: Left;   PROSTATE SURGERY     RADICAL NECK DISSECTION Bilateral 10/14/2021   Procedure: NECK DISSECTION;  Surgeon: Christia Reading, MD;  Location: [facility] OR;  Service: ENT;  Laterality: Bilateral;   SCALP LACERATION REPAIR     [facility]-Dr Katrinka Blazing   SKIN FULL THICKNESS GRAFT Bilateral 10/14/2021   Procedure: PLATYSMA FLAP CLOSURE;  Surgeon: Christia Reading, MD;  Location: [facility] OR;  Service: ENT;  Laterality: Bilateral;   TOOTH EXTRACTION  10/14/2021   Procedure: DENTAL EXTRACTIONS;   Surgeon: Christia Reading, MD;  Location: [facility] OR;  Service: ENT;;   TRACHEOSTOMY TUBE PLACEMENT N/A 10/14/2021   Procedure: TRACHEOSTOMY;  Surgeon: Christia Reading, MD;  Location: [facility] OR;  Service: ENT;  Laterality: N/A;    Social History: Social History   Socioeconomic History   Marital status: Single    Spouse name: Not on file   Number of children: Not on file   Years of education: Not on file   Highest education level: Not on file  Occupational History   Not on file  Tobacco Use   Smoking status: Former    Current packs/day: 0.25    Average packs/day: 0.3 packs/day for 50.0 years (12.5 ttl pk-yrs)    Types: Cigarettes   Smokeless tobacco: Never  Vaping Use   Vaping status: Never Used  Substance and Sexual Activity   Alcohol use: Not Currently   Drug use: No   Sexual activity: Yes    Birth control/protection: None  Other Topics Concern   Not on file  Social History Narrative   Not on file   Social Drivers of Health   Financial Resource Strain: Not on file  Food Insecurity: Not on file  Transportation Needs: Not on file  Physical Activity: Not on file  Stress: Not on file  Social Connections: Not on file  Intimate Partner Violence: Not on file    Family History: Family History  Problem Relation Age of Onset   Cancer Brother    Cancer Brother     Current Medications:  Current Outpatient Medications:    acetaminophen (TYLENOL) 650 MG CR tablet, Take 650 mg by mouth every 8 (eight) hours as needed for pain., Disp: , Rfl:    amLODipine-olmesartan (AZOR) 5-20 MG tablet, TAKE 1 TABLET BY MOUTH DAILY, Disp: 90 tablet, Rfl: 0   budesonide-formoterol (SYMBICORT)  80-4.5 MCG/ACT inhaler, Inhale 2 puffs into the lungs in the morning and at bedtime., Disp: 1 each, Rfl: 12   Cholecalciferol (VITAMIN D3) 50 MCG (2000 UT) TABS, Take 2,000 Units by mouth daily., Disp: , Rfl:    ciprofloxacin-dexamethasone (CIPRODEX) OTIC suspension, Place 4 drops into the left ear 2 (two) times  daily., Disp: , Rfl:    clindamycin (CLINDAGEL) 1 % gel, Apply topically 2 (two) times daily. Apply twice daily to chest rash, Disp: 30 g, Rfl: 0   doxycycline (VIBRA-TABS) 100 MG tablet, Take 1 tablet (100 mg total) by mouth 2 (two) times daily., Disp: 60 tablet, Rfl: 3   lidocaine (XYLOCAINE) 2 % solution, Take by mouth., Disp: , Rfl:    magnesium oxide (MAG-OX) 400 MG tablet, Take 2 tablets (800 mg total) by mouth 2 (two) times daily., Disp: 120 tablet, Rfl: 3   Menthol, Topical Analgesic, (BIOFREEZE EX), Apply 1 application  topically daily as needed (pain)., Disp: , Rfl:    naloxone (NARCAN) nasal spray 4 mg/0.1 mL, SMARTSIG:Both Nares, Disp: , Rfl:    Polyethyl Glycol-Propyl Glycol (GOODSENSE LUBRICANT EYE DROPS OP), Place 1 drop into both eyes daily as needed (dry eyes)., Disp: , Rfl:    pravastatin (PRAVACHOL) 80 MG tablet, Take 80 mg by mouth daily., Disp: , Rfl:    predniSONE (DELTASONE) 10 MG tablet, Take  4 each am x 2 days,   2 each am x 2 days,  1 each am x 2 days and stop, Disp: 14 tablet, Rfl: 0   RAPAFLO 8 MG CAPS capsule, Take 8 mg by mouth daily. Silodosin, Disp: , Rfl:    vitamin B-12 (CYANOCOBALAMIN) 500 MCG tablet, Take 500 mcg by mouth daily., Disp: , Rfl:    HYDROcodone-acetaminophen (HYCET) 7.5-325 mg/15 ml solution, Take 15 mLs by mouth every 6 (six) hours as needed for moderate pain (pain score 4-6)., Disp: 473 mL, Rfl: 0 No current facility-administered medications for this visit.  Facility-Administered Medications Ordered in Other Visits:    cetuximab (ERBITUX) chemo infusion 900 mg, 500 mg/m2 (Treatment Plan Recorded), Intravenous, Once, [name], MD   heparin lock flush 100 unit/mL, 500 Units, Intracatheter, Once PRN, [name], MD   magnesium sulfate IVPB 4 g 100 mL, 4 g, Intravenous, Once, [name], MD   sodium chloride flush (NS) 0.9 % injection 10 mL, 10 mL, Intracatheter, PRN, [name], MD   Allergies: No Known  Allergies  REVIEW OF SYSTEMS:   Review of Systems  Constitutional:  Negative for chills, fatigue and fever.  HENT:   Negative for lump/mass, mouth sores, nosebleeds, sore throat and trouble swallowing.   Eyes:  Negative for eye problems.  Respiratory:  Negative for cough and shortness of breath.   Cardiovascular:  Negative for chest pain, leg swelling and palpitations.  Gastrointestinal:  Negative for abdominal pain, constipation, diarrhea, nausea and vomiting.  Genitourinary:  Negative for bladder incontinence, difficulty urinating, dysuria, frequency, hematuria and nocturia.   Musculoskeletal:  Negative for arthralgias, back pain, flank pain, myalgias and neck pain.  Skin:  Negative for itching and rash.  Neurological:  Negative for dizziness, headaches and numbness.  Hematological:  Does not bruise/bleed easily.  Psychiatric/Behavioral:  Negative for depression, sleep disturbance and suicidal ideas. The patient is not nervous/anxious.   All other systems reviewed and are negative.    VITALS:   Weight 156 lb 4.9 oz (70.9 kg).  Wt Readings from Last 3 Encounters:  03/03/23 156 lb 4.9 oz (70.9 kg)  02/18/23 153 lb 1.6 oz (69.4 kg)  02/04/23 154 lb (69.9 kg)    Body mass index is 21.8 kg/m.  Performance status (ECOG): 1 - Symptomatic but completely ambulatory  PHYSICAL EXAM:   Physical Exam Vitals and nursing note reviewed. Exam conducted with a chaperone present.  Constitutional:      Appearance: Normal appearance.  Cardiovascular:     Rate and Rhythm: Normal rate and regular rhythm.     Pulses: Normal pulses.     Heart sounds: Normal heart sounds.  Pulmonary:     Effort: Pulmonary effort is normal.     Breath sounds: Normal breath sounds.  Abdominal:     Palpations: Abdomen is soft. There is no hepatomegaly, splenomegaly or mass.     Tenderness: There is no abdominal tenderness.  Musculoskeletal:     Right lower leg: No edema.     Left lower leg: No edema.   Lymphadenopathy:     Cervical: No cervical adenopathy.     Right cervical: No superficial, deep or posterior cervical adenopathy.    Left cervical: No superficial, deep or posterior cervical adenopathy.     Upper Body:     Right upper body: No supraclavicular or axillary adenopathy.     Left upper body: No supraclavicular or axillary adenopathy.  Neurological:     General: No focal deficit present.     Mental Status: He is alert and oriented to person, place, and time.  Psychiatric:        Mood and Affect: Mood normal.        Behavior: Behavior normal.     LABS:   CBC     Component Value Date/Time   WBC 8.9 03/03/2023 0853   RBC 3.41 (L) 03/03/2023 0853   HGB 11.4 (L) 03/03/2023 0853   HCT 34.7 (L) 03/03/2023 0853   PLT 299 03/03/2023 0853   MCV 101.8 (H) 03/03/2023 0853   MCH 33.4 03/03/2023 0853   MCHC 32.9 03/03/2023 0853   RDW 13.2 03/03/2023 0853   LYMPHSABS 1.6 03/03/2023 0853   MONOABS 0.9 03/03/2023 0853   EOSABS 0.4 03/03/2023 0853   BASOSABS 0.1 03/03/2023 0853    CMP      Component Value Date/Time   NA 137 03/03/2023 0853   K 3.7 03/03/2023 0853   CL 101 03/03/2023 0853   CO2 26 03/03/2023 0853   GLUCOSE 98 03/03/2023 0853   BUN 10 03/03/2023 0853   CREATININE 0.65 03/03/2023 0853   CALCIUM 10.2 03/03/2023 0853   PROT 7.2 03/03/2023 0853   ALBUMIN 3.1 (L) 03/03/2023 0853   AST 21 03/03/2023 0853   ALT 16 03/03/2023 0853   ALKPHOS 49 03/03/2023 0853   BILITOT 0.6 03/03/2023 0853   GFRNONAA >60 03/03/2023 0853   GFRAA >60 10/26/2018 1137     Lab Results  Component Value Date   CEA 2.2 01/19/2013   /  CEA  Date Value Ref Range Status  01/19/2013 2.2 0.0 - 5.0 ng/mL Final    Comment:    Performed at [facility]   No results found for: "PSA1" No results found for: "CAN199" No results found for: "CAN125"  No results found for: "TOTALPROTELP", "ALBUMINELP", "A1GS", "A2GS", "BETS", "BETA2SER", "GAMS", "MSPIKE", "SPEI" Lab Results   Component Value Date   TIBC 244 (L) 08/04/2022   TIBC 251 05/12/2022   FERRITIN 273 08/04/2022   FERRITIN 195 05/12/2022   IRONPCTSAT 19 08/04/2022   IRONPCTSAT 18 05/12/2022   Lab Results  Component Value Date   LDH 172 11/12/2020   LDH 175 10/26/2018   LDH 179 10/25/2013     STUDIES:   CT US GUIDED BIOPSY Result Date: 03/02/2023 INDICATION: Fiducial Marker Placement below and along medial/lateral aspects of liver metastasis in the right hepatic lobe; liver mass EXAM: CT-GUIDED, ULTRASOUND-ASSISTED LIVER MASS FIDUCIAL MARKER PLACEMENT COMPARISON:  PET-CT, 01/01/2023.  MR abdomen, 01/07/2023. MEDICATIONS: 25 mg Benadryl IV. ANESTHESIA/SEDATION: Moderate (conscious) sedation was employed during this procedure. A total of Versed 1 mg and Fentanyl 75 mcg was administered intravenously. Moderate Sedation Time: 39 minutes. The patient's level of consciousness and vital signs were monitored continuously by radiology nursing throughout the procedure under my direct supervision. CONTRAST:  None. COMPLICATIONS: None immediate. PROCEDURE: RADIATION DOSE REDUCTION: This exam was performed according to the departmental dose-optimization program which includes automated exposure control, adjustment of the mA and/or kV according to patient size and/or use of iterative reconstruction technique. Informed consent was obtained from the patient following an explanation of the procedure, risks, benefits and alternatives. A time out was performed prior to the initiation of the procedure. The patient was positioned supine on the CT table and a limited CT was performed for procedural planning demonstrating an approximately 5 cm hypodense superior RIGHT hepatic lobe mass. Ultrasound scanning was performed, demonstrating a heterogeneously hypoechoic rounded mass within the region of interest The procedure was planned. The operative site was prepped and draped in the usual sterile fashion. Appropriate trajectory was  confirmed with a 22 gauge spinal needle after the adjacent tissues were anesthetized with 1% Lidocaine. Under direct sonographic and intermittent CT guidance a 15-cm, 21-G access needle was inserted towards the mass lesion. The tip of the needle was initially positioned superior to the tumor. At this position, a coil as a fiducial marker was successfully placed. The needle was removed and a new 15 cm 21 gauge access needle was inserted inferior to the mass allowing for placement of an additional fiducial marker the needle slightly pulled back and a final marker was placed. Four fiducial markers were identified and confirmed properly positioned on the completion CT images. The needle was removed and superficial hemostasis was obtained by manual compression. A limited postprocedural CT was negative for hemorrhage or additional complication. A dressing was placed. The patient tolerated the procedure well without immediate postprocedural complication. FINDINGS: *Superior RIGHT hepatic lobe, hepatic segment VII/VIII, approximately 5 cm mass was targeted for marker placement. *[employer] (2 mm x 5 mm) coils were substituted for fiducials *Four (4) fiducial markers were deployed placed near the superior margin, superomedial, inferior, and inferior medial to the lesion. IMPRESSION: Successful CT-guided, ultrasound-assisted liver mass fiducial marker placement. Markers positioned, as described above. Roanna Banning, MD [facility] Electronically Signed   By: Roanna Banning M.D.   On: 03/02/2023 17:23   CT GUIDED NEEDLE PLACEMENT Result Date: 03/02/2023 INDICATION: Fiducial Marker Placement below and along medial/lateral aspects of liver metastasis in the right hepatic lobe; liver mass EXAM: CT-GUIDED, ULTRASOUND-ASSISTED LIVER MASS FIDUCIAL MARKER PLACEMENT COMPARISON:  PET-CT, 01/01/2023.  MR abdomen, 01/07/2023. MEDICATIONS: 25 mg Benadryl IV.  ANESTHESIA/SEDATION: Moderate (conscious) sedation was employed during this procedure. A total of Versed 1 mg and Fentanyl 75 mcg was administered intravenously. Moderate Sedation Time: 39 minutes. The patient's level of consciousness and vital signs were monitored continuously by radiology nursing throughout the procedure under my direct supervision. CONTRAST:  None. COMPLICATIONS: None immediate. PROCEDURE: RADIATION DOSE REDUCTION: This exam was performed according  to the departmental dose-optimization program which includes automated exposure control, adjustment of the mA and/or kV according to patient size and/or use of iterative reconstruction technique. Informed consent was obtained from the patient following an explanation of the procedure, risks, benefits and alternatives. A time out was performed prior to the initiation of the procedure. The patient was positioned supine on the CT table and a limited CT was performed for procedural planning demonstrating an approximately 5 cm hypodense superior RIGHT hepatic lobe mass. Ultrasound scanning was performed, demonstrating a heterogeneously hypoechoic rounded mass within the region of interest The procedure was planned. The operative site was prepped and draped in the usual sterile fashion. Appropriate trajectory was confirmed with a 22 gauge spinal needle after the adjacent tissues were anesthetized with 1% Lidocaine. Under direct sonographic and intermittent CT guidance a 15-cm, 21-G access needle was inserted towards the mass lesion. The tip of the needle was initially positioned superior to the tumor. At this position, a coil as a fiducial marker was successfully placed. The needle was removed and a new 15 cm 21 gauge access needle was inserted inferior to the mass allowing for placement of an additional fiducial marker the needle slightly pulled back and a final marker was placed. Four fiducial markers were identified and confirmed properly positioned on  the completion CT images. The needle was removed and superficial hemostasis was obtained by manual compression. A limited postprocedural CT was negative for hemorrhage or additional complication. A dressing was placed. The patient tolerated the procedure well without immediate postprocedural complication. FINDINGS: *Superior RIGHT hepatic lobe, hepatic segment VII/VIII, approximately 5 cm mass was targeted for marker placement. *[employer] (2 mm x 5 mm) coils were substituted for fiducials *Four (4) fiducial markers were deployed placed near the superior margin, superomedial, inferior, and inferior medial to the lesion. IMPRESSION: Successful CT-guided, ultrasound-assisted liver mass fiducial marker placement. Markers positioned, as described above. Roanna Banning, MD [facility] Radiology Electronically Signed   By: Roanna Banning M.D.   On: 03/02/2023 17:23

## 2023-03-02 NOTE — H&P (Signed)
 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 Has undergone treatment. Recent PET shows hepatic lesion has increase in size, previously biopsied in Jan 2024, path positive for metastatic squamous cell. He is seeing radiation oncology who is planning SBRT to the hepatic lesion.  He is referred to IR for image guided placement of fiducial markers.  PMHx, meds, labs, imaging, allergies reviewed. Feels well, no recent fevers, chills, illness. Has been NPO today as directed.   Past Medical History:  Diagnosis Date   Arthritis    BPH (benign prostatic hyperplasia)    Difficult intubation    HOH (hard of hearing)    Hyperlipidemia 11/02/2017   Hypertension    Hypertension 11/02/2017   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HCC)    2014   Vitamin D deficiency 11/02/2017    Past Surgical History:  Procedure Laterality Date   COLONOSCOPY N/A 11/24/2012   Procedure: COLONOSCOPY;  Surgeon: Malissa Hippo, MD;  [facility];  Service: Endoscopy;  Laterality: N/A;  830-moved to 730 Ann notified pt   FLOOR OF MOUTH BIOPSY N/A 10/14/2021   Procedure: FLOOR OF MOUTH RESECTION;  Surgeon: Christia Reading, MD;  Location: [facility] OR;  Service: ENT;  Laterality: N/A;   HEMORROIDECTOMY     KNEE ARTHROSCOPY WITH LATERAL MENISECTOMY Right 08/31/2017   Procedure: KNEE ARTHROSCOPY WITH LATERAL MENISECTOMY;  Surgeon: Vickki Hearing, MD;  Location: [facility] ORS;  Service: Orthopedics;  Laterality: Right;   LESION EXCISION N/A 03/23/2012   Procedure: EXCISION NEOPLASM SCALP ;  Surgeon: Dalia Heading, MD;  Location: [facility] ORS;  Service: General;  Laterality: N/A;  Excision of Scalp Neoplasm   ORCHIECTOMY Right 12/20/2012   Procedure: RIGHT RADICAL  ORCHIECTOMY/POSSIBLE BX RIGHT TESTICLE;  Surgeon: Ky Barban, MD;  Location: [facility] ORS;  Service: Urology;  Laterality: Right;   PORTACATH PLACEMENT Left 03/25/2022   Procedure: INSERTION PORT-A-CATH;  Surgeon: Franky Macho, MD;  Location: [facility] ORS;  Service: General;  Laterality: Left;   PROSTATE SURGERY     RADICAL NECK DISSECTION Bilateral 10/14/2021   Procedure: NECK DISSECTION;  Surgeon: Christia Reading, MD;  Location[facility] OR;  Service: ENT;  Laterality: Bilateral;   SCALP LACERATION REPAIR     [facility]-Dr Katrinka Blazing   SKIN FULL THICKNESS GRAFT Bilateral 10/14/2021   Procedure: PLATYSMA FLAP CLOSURE;  Surgeon: Christia Reading, MD;  Location: [facility] OR;  Service: ENT;  Laterality: Bilateral;   TOOTH EXTRACTION  10/14/2021   Procedure: DENTAL EXTRACTIONS;  Surgeon: Christia Reading, MD;  Location: [facility] OR;  Service: ENT;;   TRACHEOSTOMY TUBE PLACEMENT N/A 10/14/2021   Procedure: TRACHEOSTOMY;  Surgeon: Christia Reading, MD;  Location: [facility] OR;  Service: ENT;  Laterality: N/A;    Allergies: Patient has no known allergies.  Medications: Prior to Admission medications   Medication Sig Start Date End Date Taking? Authorizing Provider  acetaminophen (TYLENOL) 650 MG CR tablet Take 650 mg by mouth every 8 (eight) hours as needed for pain.    [provider]  amLODipine-olmesartan (AZOR) 5-20 MG tablet TAKE 1 TABLET BY MOUTH DAILY 09/22/22  Yes Nyoka Cowden, MD  budesonide-formoterol [facility]) 80-4.5 MCG/ACT inhaler Inhale 2 puffs into the lungs in the morning and at bedtime. 05/29/22   Kommor, Wyn Forster, MD  Cholecalciferol (VITAMIN D3) 50 MCG (  2000 UT) TABS Take 2,000 Units by mouth daily.   Yes [provider]  ciprofloxacin-dexamethasone (CIPRODEX) OTIC suspension Place 4 drops into the left ear 2 (two) times daily. 11/18/22   [provider]  clindamycin (CLINDAGEL) 1 % gel Apply topically 2 (two) times daily. Apply twice daily to chest rash 10/26/22   [name], MD   doxycycline (VIBRA-TABS) 100 MG tablet Take 1 tablet (100 mg total) by mouth 2 (two) times daily. 09/28/22   [name], MD  HYDROcodone-acetaminophen (HYCET) 7.5-325 mg/15 ml solution Take 15 mLs by mouth every 6 (six) hours as needed for moderate pain (pain score 4-6). 01/25/23   [name], MD  lidocaine (XYLOCAINE) 2 % solution Take by mouth. 07/30/22   [provider]  magnesium oxide (MAG-OX) 400 MG tablet Take 2 tablets (800 mg total) by mouth 2 (two) times daily. 01/14/23  Yes [name], MD  Menthol, Topical Analgesic, (BIOFREEZE EX) Apply 1 application  topically daily as needed (pain).    [provider]  naloxone [facility]) nasal spray 4 mg/0.1 mL SMARTSIG:Both Nares 04/11/22   [provider]  Polyethyl Glycol-Propyl Glycol (GOODSENSE LUBRICANT EYE DROPS OP) Place 1 drop into both eyes daily as needed (dry eyes).    [provider]  pravastatin (PRAVACHOL) 80 MG tablet Take 80 mg by mouth daily.   Yes [provider]  predniSONE (DELTASONE) 10 MG tablet Take  4 each am x 2 days,   2 each am x 2 days,  1 each am x 2 days and stop 08/07/22   Nyoka Cowden, MD  RAPAFLO 8 MG CAPS capsule Take 8 mg by mouth daily. Silodosin 10/13/13  Yes [provider]  vitamin B-12 (CYANOCOBALAMIN) 500 MCG tablet Take 500 mcg by mouth daily.   Yes [provider]     Family History  Problem Relation Age of Onset   Cancer Brother    Cancer Brother     Social History   Socioeconomic History   Marital status: Single    Spouse name: Not on file   Number of children: Not on file   Years of education: Not on file   Highest education level: Not on file  Occupational History   Not on file  Tobacco Use   Smoking status: Former    Current packs/day: 0.25    Average packs/day: 0.3 packs/day for 50.0 years (12.5 ttl pk-yrs)    Types: Cigarettes   Smokeless tobacco: Never  Vaping Use   Vaping status: Never Used   Substance and Sexual Activity   Alcohol use: Not Currently   Drug use: No   Sexual activity: Yes    Birth control/protection: None  Other Topics Concern   Not on file  Social History Narrative   Not on file   Social Drivers of Health   Financial Resource Strain: Not on file  Food Insecurity: Not on file  Transportation Needs: Not on file  Physical Activity: Not on file  Stress: Not on file  Social Connections: Not on file    Review of Systems: A 12 point ROS discussed and pertinent positives are indicated in the HPI above.  All other systems are negative.  Review of Systems  Vital Signs: BP (!) 141/82   Pulse 93   Temp 97.9 F (36.6 C) (Axillary)   Resp 16   SpO2 99%   Physical Exam HENT:     Mouth/Throat:     Mouth: Mucous membranes are moist.  Cardiovascular:     Rate and Rhythm: Normal rate and regular rhythm.     Heart sounds: Normal heart sounds.  Pulmonary:     Effort: Pulmonary effort is normal. No respiratory distress.     Breath sounds: Normal breath sounds.  Skin:    General: Skin is warm and dry.  Neurological:     General: No focal deficit present.     Mental Status: He is oriented to person, place, and time.  Psychiatric:        Mood and Affect: Mood normal.        Thought Content: Thought content normal.        Judgment: Judgment normal.     Imaging: No results found.  Labs:  CBC: Recent Labs    01/14/23 0837 01/21/23 0843 02/04/23 0947 02/18/23 0952  WBC 6.2 7.3 7.1 8.6  HGB 12.3* 12.4* 12.1* 11.9*  HCT 37.9* 38.0* 37.2* 36.2*  PLT 263 297 297 285    COAGS: No results for input(s): "INR", "APTT" in the last 8760 hours.  BMP: Recent Labs    01/14/23 0837 01/21/23 0843 02/04/23 0947 02/18/23 0952  NA 137 135 136 139  K 3.9 4.2 4.0 4.0  CL 102 101 102 103  CO2 26 25 26 26   GLUCOSE 107* 107* 97 99  BUN 10 11 10 11   CALCIUM 10.2 10.1 10.0 10.3  CREATININE 0.78 0.65 0.67 0.65  GFRNONAA >60 >60 >60 >60    LIVER  FUNCTION TESTS: Recent Labs    01/14/23 0837 01/21/23 0843 02/04/23 0947 02/18/23 0952  BILITOT 0.8 0.6 0.7 0.6  AST 19 18 17 18   ALT 14 14 14 15   ALKPHOS 45 47 50 47  PROT 7.2 7.2 6.5 7.1  ALBUMIN 3.4* 3.3* 3.2* 3.2*     Assessment and Plan: Metastatic hepatic lesion. Plan for image guided fiducial placement. Labs reviewed. Risks and benefits of hepatic fiducial marking was discussed with the patient and/or patient's family including, but not limited to bleeding, infection, damage to adjacent structures or low yield requiring additional tests.  All of the questions were answered and there is agreement to proceed.  Consent signed and in chart.    Electronically Signed: Brayton El, PA-C 03/02/2023, 7:13 AM   I spent a total of 20 minutes in face to face in clinical consultation, greater than 50% of which was counseling/coordinating care for hepatic fiducials

## 2023-03-02 NOTE — Procedures (Signed)
 Vascular and Interventional Radiology Procedure Note  Patient: Nathaniel Hicks DOB: [birth year] Medical Record Number: [id number] Note Date/Time: 03/02/23 8:54 AM   Performing Physician: Roanna Banning, MD Assistant(s): None  Diagnosis: H&N CA liver met. Treatment planning for XRT   Procedure: LIVER MASS FIDUCIAL MARKER PLACEMENT   Anesthesia: Conscious Sedation Complications: None Estimated Blood Loss: Minimal Specimens: Sent for None   Findings:  Successful Ultrasound and CT-guided fiducial marker placement of liver mass (seg VII/VII). Hemostasis was achieved using Manual Pressure.   Plan: Bed rest for 2 hours.  See detailed procedure note with images in PACS. The patient tolerated the procedure well without incident or complication and was returned to Recovery in stable condition.    Roanna Banning, MD V[facility] and Interventional Radiology Specialists [facility] Radiology   Pager. [phone] Clinic. [phone]

## 2023-03-03 ENCOUNTER — Ambulatory Visit: Payer: 59 | Admitting: Radiation Oncology

## 2023-03-03 ENCOUNTER — Inpatient Hospital Stay: Payer: 59

## 2023-03-03 ENCOUNTER — Other Ambulatory Visit: Payer: Self-pay | Admitting: *Deleted

## 2023-03-03 ENCOUNTER — Ambulatory Visit: Payer: 59

## 2023-03-03 ENCOUNTER — Inpatient Hospital Stay (HOSPITAL_BASED_OUTPATIENT_CLINIC_OR_DEPARTMENT_OTHER): Payer: 59 | Admitting: Hematology

## 2023-03-03 VITALS — Wt 156.3 lb

## 2023-03-03 VITALS — BP 122/79 | HR 117 | Temp 98.8°F | Resp 20

## 2023-03-03 VITALS — HR 104

## 2023-03-03 DIAGNOSIS — I959 Hypotension, unspecified: Secondary | ICD-10-CM | POA: Diagnosis not present

## 2023-03-03 DIAGNOSIS — R21 Rash and other nonspecific skin eruption: Secondary | ICD-10-CM | POA: Diagnosis not present

## 2023-03-03 DIAGNOSIS — C049 Malignant neoplasm of floor of mouth, unspecified: Secondary | ICD-10-CM

## 2023-03-03 DIAGNOSIS — Z95828 Presence of other vascular implants and grafts: Secondary | ICD-10-CM

## 2023-03-03 DIAGNOSIS — Z5111 Encounter for antineoplastic chemotherapy: Secondary | ICD-10-CM | POA: Diagnosis not present

## 2023-03-03 DIAGNOSIS — Z79899 Other long term (current) drug therapy: Secondary | ICD-10-CM

## 2023-03-03 DIAGNOSIS — Z87891 Personal history of nicotine dependence: Secondary | ICD-10-CM | POA: Diagnosis not present

## 2023-03-03 DIAGNOSIS — C787 Secondary malignant neoplasm of liver and intrahepatic bile duct: Secondary | ICD-10-CM | POA: Diagnosis not present

## 2023-03-03 LAB — COMPREHENSIVE METABOLIC PANEL
ALT: 16 U/L (ref 0–44)
AST: 21 U/L (ref 15–41)
Albumin: 3.1 g/dL — ABNORMAL LOW (ref 3.5–5.0)
Alkaline Phosphatase: 49 U/L (ref 38–126)
Anion gap: 10 (ref 5–15)
BUN: 10 mg/dL (ref 8–23)
CO2: 26 mmol/L (ref 22–32)
Calcium: 10.2 mg/dL (ref 8.9–10.3)
Chloride: 101 mmol/L (ref 98–111)
Creatinine, Ser: 0.65 mg/dL (ref 0.61–1.24)
GFR, Estimated: >60 mL/min (ref >60)
Glucose, Bld: 98 mg/dL (ref 70–99)
Potassium: 3.7 mmol/L (ref 3.5–5.1)
Sodium: 137 mmol/L (ref 135–145)
Total Bilirubin: 0.6 mg/dL (ref 0.0–1.2)
Total Protein: 7.2 g/dL (ref 6.5–8.1)

## 2023-03-03 LAB — CBC WITH DIFFERENTIAL/PLATELET
Abs Immature Granulocytes: 0.03 10*3/uL (ref 0.00–0.07)
Basophils Absolute: 0.1 10*3/uL (ref 0.0–0.1)
Basophils Relative: 1 %
Eosinophils Absolute: 0.4 10*3/uL (ref 0.0–0.5)
Eosinophils Relative: 4 %
HCT: 34.7 % — ABNORMAL LOW (ref 39.0–52.0)
Hemoglobin: 11.4 g/dL — ABNORMAL LOW (ref 13.0–17.0)
Immature Granulocytes: 0 %
Lymphocytes Relative: 18 %
Lymphs Abs: 1.6 10*3/uL (ref 0.7–4.0)
MCH: 33.4 pg (ref 26.0–34.0)
MCHC: 32.9 g/dL (ref 30.0–36.0)
MCV: 101.8 fL — ABNORMAL HIGH (ref 80.0–100.0)
Monocytes Absolute: 0.9 10*3/uL (ref 0.1–1.0)
Monocytes Relative: 10 %
Neutro Abs: 5.9 10*3/uL (ref 1.7–7.7)
Neutrophils Relative %: 67 %
Platelets: 299 10*3/uL (ref 150–400)
RBC: 3.41 MIL/uL — ABNORMAL LOW (ref 4.22–5.81)
RDW: 13.2 % (ref 11.5–15.5)
WBC: 8.9 10*3/uL (ref 4.0–10.5)
nRBC: 0 % (ref 0.0–0.2)

## 2023-03-03 LAB — TSH: TSH: 1.532 u[IU]/mL (ref 0.350–4.500)

## 2023-03-03 LAB — MAGNESIUM: Magnesium: 1.1 mg/dL — ABNORMAL LOW (ref 1.7–2.4)

## 2023-03-03 MED ORDER — SODIUM CHLORIDE 0.9 % IV SOLN
Freq: Once | INTRAVENOUS | Status: AC
Start: 2023-03-03 — End: 2023-03-03

## 2023-03-03 MED ORDER — FAMOTIDINE IN NACL 20-0.9 MG/50ML-% IV SOLN
20.0000 mg | Freq: Once | INTRAVENOUS | Status: AC
  Administered 2023-03-03: 20 mg via INTRAVENOUS
  Filled 2023-03-03: qty 50

## 2023-03-03 MED ORDER — DIPHENHYDRAMINE HCL 50 MG/ML IJ SOLN
50.0000 mg | Freq: Once | INTRAMUSCULAR | Status: AC
  Administered 2023-03-03: 50 mg via INTRAVENOUS
  Filled 2023-03-03: qty 1

## 2023-03-03 MED ORDER — SODIUM CHLORIDE 0.9 % IV SOLN: INTRAVENOUS | Status: DC

## 2023-03-03 MED ORDER — HEPARIN SOD (PORK) LOCK FLUSH 100 UNIT/ML IV SOLN
500.0000 [IU] | Freq: Once | INTRAVENOUS | Status: AC | PRN
  Administered 2023-03-03: 500 [IU]

## 2023-03-03 MED ORDER — SODIUM CHLORIDE FLUSH 0.9 % IV SOLN
10.0000 mL | Freq: Once | INTRAVENOUS | Status: AC
Start: 2023-03-03 — End: 2023-03-03
  Administered 2023-03-03: 10 mL via INTRAVENOUS
  Filled 2023-03-03: qty 10

## 2023-03-03 MED ORDER — MAGNESIUM SULFATE 4 GM/100ML IV SOLN
4.0000 g | Freq: Once | INTRAVENOUS | Status: AC
  Administered 2023-03-03: 4 g via INTRAVENOUS
  Filled 2023-03-03: qty 100

## 2023-03-03 MED ORDER — HYDROCODONE-ACETAMINOPHEN 7.5-325 MG/15ML PO SOLN: 15.0000 mL | Freq: Four times a day (QID) | ORAL | 0 refills | Status: DC | PRN

## 2023-03-03 MED ORDER — METHYLPREDNISOLONE SODIUM SUCC 125 MG IJ SOLR
125.0000 mg | Freq: Once | INTRAMUSCULAR | Status: AC
  Administered 2023-03-03: 125 mg via INTRAVENOUS
  Filled 2023-03-03: qty 2

## 2023-03-03 MED ORDER — SODIUM CHLORIDE 0.9% FLUSH
10.0000 mL | INTRAVENOUS | Status: DC | PRN
  Administered 2023-03-03: 10 mL

## 2023-03-03 MED ORDER — CETUXIMAB CHEMO IV INJECTION 200 MG/100ML
500.0000 mg/m2 | Freq: Once | INTRAVENOUS | Status: AC
  Administered 2023-03-03: 900 mg via INTRAVENOUS
  Filled 2023-03-03: qty 50

## 2023-03-03 NOTE — Progress Notes (Incomplete)
 Has armband been applied?  {yes no:314532}  Does patient have an allergy to IV contrast dye?: {yes no:314532}   Has patient ever received premedication for IV contrast dye?: {yes no:314532}   Does patient take metformin?: {yes no:314532}  If patient does take metformin when was the last dose: {Time; dates multiple:15870}  Date of lab work: 02/18/2023 BUN: 11 CR: 0.65 eGfr: >60  IV site: Left Subclavian Port  Has IV site been added to flowsheet?  Yes  There were no vitals taken for this visit.

## 2023-03-03 NOTE — Patient Instructions (Signed)
 [facility] [place] - A DEPT OF [facility]  Discharge Instructions: Thank you for choosing [facility] to provide your oncology and hematology care.  If you have a lab appointment with the Cancer Center - please note that after April 8th, 2024, all labs will be drawn in the cancer center.  You do not have to check in or register with the main entrance as you have in the past but will complete your check-in in the cancer center.  Wear comfortable clothing and clothing appropriate for easy access to any Portacath or PICC line.   We strive to give you quality time with your provider. You may need to reschedule your appointment if you arrive late (15 or more minutes).  Arriving late affects you and other patients whose appointments are after yours.  Also, if you miss three or more appointments without notifying the office, you may be dismissed from the clinic at the provider's discretion.      For prescription refill requests, have your pharmacy contact our office and allow 72 hours for refills to be completed.    Today you received the following chemotherapy and/or immunotherapy agents Erbitux.  Cetuximab Injection What is this medication? CETUXIMAB (se TUX i mab) treats head and neck cancer. It may also be used to treat colorectal cancer. It works by blocking a protein that causes cancer cells to grow and multiply. This helps to slow or stop the spread of cancer cells. It is a monoclonal antibody. This medicine may be used for other purposes; ask your health care provider or pharmacist if you have questions. COMMON BRAND NAME(S): Erbitux What should I tell my care team before I take this medication? They need to know if you have any of these conditions: Heart disease History of tick bites Low levels of calcium, magnesium, or potassium in the blood Lung disease Red meat allergy An unusual or allergic reaction to cetuximab, other medications, foods, dyes,  or preservatives Pregnant or trying to get pregnant Breast-feeding How should I use this medication? This medication is infused into a vein. It is given by your care team in a hospital or clinic setting. Talk to your care team about the use of this medication in children. Special care may be needed. Overdosage: If you think you have taken too much of this medicine contact a poison control center or emergency room at once. NOTE: This medicine is only for you. Do not share this medicine with others. What if I miss a dose? Keep appointments for follow-up doses. It is important not to miss your dose. Call your care team if you are unable to keep an appointment. What may interact with this medication? Interactions are not expected. This list may not describe all possible interactions. Give your health care provider a list of all the medicines, herbs, non-prescription drugs, or dietary supplements you use. Also tell them if you smoke, drink alcohol, or use illegal drugs. Some items may interact with your medicine. What should I watch for while using this medication? Visit your care team for regular checks on your progress. This medication may make you feel generally unwell. This is not uncommon, as chemotherapy can affect healthy cells as well as cancer cells. Report any side effects. Continue your course of treatment even though you feel ill unless your care team tells you to stop. You may need blood work done while you are taking this medication. This medication can make you more sensitive  to the sun. Keep out of the sun while taking this medication and for 2 months after the last dose. If you cannot avoid being in the sun, wear protective clothing and sunscreen. Do not use sun lamps, tanning beds, or tanning booths. This medication can cause serious infusion reactions. To reduce the risk, your care team may give you other medications to take before receiving this one. Be sure to follow the directions of  your care team. This medication may cause serious skin reactions. They can happen weeks to months after starting the medication. Contact your care team right away if you notice fevers or flu-like symptoms with a rash. The rash may be red or purple and then turn into blisters or peeling of the skin. You may also notice a red rash with swelling of the face, lips, or lymph nodes in your neck or under your arms. Talk to your care team if you may be pregnant. Serious birth defects can occur if you take this medication during pregnancy and for 2 months after the last dose. You will need a negative pregnancy test before starting this medication. Contraception is recommended while taking this medication and for 2 months after the last dose. Your care team can help you find the option that works for you. Do not breastfeed while taking this medication and for 2 months after the last dose. This medication may cause infertility. Talk to your care team if you are concerned about your fertility. What side effects may I notice from receiving this medication? Side effects that you should report to your care team as soon as possible: Allergic reactions--skin rash, itching, hives, swelling of the face, lips, tongue, or throat Dry cough, shortness of breath or trouble breathing Heart attack--pain or tightness in the chest, shoulders, arms, or jaw, nausea, shortness of breath, cold or clammy skin, feeling faint or lightheaded Infusion reactions--chest pain, shortness of breath or trouble breathing, feeling faint or lightheaded Low calcium level--muscle pain or cramps, confusion, tingling, or numbness in the hands or feet Low magnesium level--muscle pain or cramps, unusual weakness or fatigue, fast or irregular heartbeat, tremors Low potassium level--muscle pain or cramps, unusual weakness or fatigue, fast or irregular heartbeat, constipation Redness, blistering, peeling, or loosening of the skin, including inside the  mouth Side effects that usually do not require medical attention (report to your care team if they continue or are bothersome): Diarrhea Headache Joint pain Nausea Unusual weakness or fatigue Weight loss This list may not describe all possible side effects. Call your doctor for medical advice about side effects. You may report side effects to FDA at 1-800-FDA-1088. Where should I keep my medication? This medication is given in a hospital or clinic. It will not be stored at home. NOTE: This sheet is a summary. It may not cover all possible information. If you have questions about this medicine, talk to your doctor, pharmacist, or health care provider.  2024 Elsevier/Gold Standard (2021-05-16 00:00:00)       To help prevent nausea and vomiting after your treatment, we encourage you to take your nausea medication as directed.  BELOW ARE SYMPTOMS THAT SHOULD BE REPORTED IMMEDIATELY: *FEVER GREATER THAN 100.4 F (38 C) OR HIGHER *CHILLS OR SWEATING *NAUSEA AND VOMITING THAT IS NOT CONTROLLED WITH YOUR NAUSEA MEDICATION *UNUSUAL SHORTNESS OF BREATH *UNUSUAL BRUISING OR BLEEDING *URINARY PROBLEMS (pain or burning when urinating, or frequent urination) *BOWEL PROBLEMS (unusual diarrhea, constipation, pain near the anus) TENDERNESS IN MOUTH AND THROAT WITH OR WITHOUT PRESENCE  OF ULCERS (sore throat, sores in mouth, or a toothache) UNUSUAL RASH, SWELLING OR PAIN  UNUSUAL VAGINAL DISCHARGE OR ITCHING   Items with * indicate a potential emergency and should be followed up as soon as possible or go to the Emergency Department if any problems should occur.  Please show the CHEMOTHERAPY ALERT CARD or IMMUNOTHERAPY ALERT CARD at check-in to the Emergency Department and triage nurse.  Should you have questions after your visit or need to cancel or reschedule your appointment, please contact [facility] [place] - A DEPT OF Eligha Bridegroom [facility] [phone]  and follow the prompts.   Office hours are 8:00 a.m. to 4:30 p.m. Monday - Friday. Please note that voicemails left after 4:00 p.m. may not be returned until the following business day.  We are closed weekends and major holidays. You have access to a nurse at all times for urgent questions. Please call the main number to the clinic [phone] and follow the prompts.  For any non-urgent questions, you may also contact your provider using MyChart. We now offer e-Visits for anyone 82 and older to request care online for non-urgent symptoms. For details visit [email or web address].   Also download the MyChart app! Go to the app store, search "MyChart", open the app, select [place], and log in with your MyChart username and password.

## 2023-03-03 NOTE — Progress Notes (Signed)
 Patient has been examined by Dr. Ellin Saba. Vital signs (HR 117) and labs have been reviewed by MD - ANC, Creatinine, LFTs, hemoglobin, and platelets are within treatment parameters per M.D. - pt may proceed with treatment.  Primary RN and pharmacy notified.

## 2023-03-03 NOTE — Progress Notes (Signed)
 Message received from A. Dareen Piano RN  / Dr. Ellin Saba to proceed with treatment. Magnesium 1.1. Standing orders followed. Patient will receive 4 grams Magnesium Sulfate today.  Heart rate 117 today. MD aware. Patient had follow up with Dr. Ellin Saba and labs and vital signs reviewed.   Heart rate recheck 104.   Erbitux given today per MD orders. Tolerated infusion without adverse affects. Vital signs stable. No complaints at this time. Discharged from clinic by wheel chair in stable condition. Alert and oriented x 3. F/U with [facility] as scheduled.

## 2023-03-03 NOTE — Patient Instructions (Signed)
 [facility] at [facility] Discharge Instructions   You were seen and examined today by Dr. Ellin Saba.  He reviewed the results of your lab work which are normal/stable.   Keep your hands well moisturized, liberally apply moisturizing lotion three times a day, and do not soak your hands in water for extended periods of time.   We will proceed with your treatment today.   Return as scheduled.    Thank you for choosing [facility] at [facility] to provide your oncology and hematology care.  To afford each patient quality time with our provider, please arrive at least 15 minutes before your scheduled appointment time.   If you have a lab appointment with the Cancer Center please come in thru the Main Entrance and check in at the main information desk.  You need to re-schedule your appointment should you arrive 10 or more minutes late.  We strive to give you quality time with our providers, and arriving late affects you and other patients whose appointments are after yours.  Also, if you no show three or more times for appointments you may be dismissed from the clinic at the providers discretion.     Again, thank you for choosing [facility].  Our hope is that these requests will decrease the amount of time that you wait before being seen by our physicians.       _____________________________________________________________  Should you have questions after your visit to [facility], please contact our office at [phone] and follow the prompts.  Our office hours are 8:00 a.m. and 4:30 p.m. Monday - Friday.  Please note that voicemails left after 4:00 p.m. may not be returned until the following business day.  We are closed weekends and major holidays.  You do have access to a nurse 24-7, just call the main number to the clinic [phone] and do not press any options, hold on the line and a nurse will answer the phone.     For prescription refill requests, have your pharmacy contact our office and allow 72 hours.    Due to Covid, you will need to wear a mask upon entering the hospital. If you do not have a mask, a mask will be given to you at the Main Entrance upon arrival. For doctor visits, patients may have 1 support person age 63 or older with them. For treatment visits, patients can not have anyone with them due to social distancing guidelines and our immunocompromised population.

## 2023-03-04 ENCOUNTER — Ambulatory Visit
Admission: RE | Admit: 2023-03-04 | Discharge: 2023-03-04 | Disposition: A | Discharge status: Home / Self Care | Payer: 59 | Source: Ambulatory Visit | Attending: Radiation Oncology | Admitting: Radiation Oncology

## 2023-03-04 ENCOUNTER — Ambulatory Visit: Payer: 59 | Admitting: Radiation Oncology

## 2023-03-04 VITALS — BP 128/64 | HR 90 | Temp 98.2°F | Resp 20 | Ht 71.0 in | Wt 160.4 lb

## 2023-03-04 DIAGNOSIS — C787 Secondary malignant neoplasm of liver and intrahepatic bile duct: Secondary | ICD-10-CM | POA: Diagnosis not present

## 2023-03-04 DIAGNOSIS — C01 Malignant neoplasm of base of tongue: Secondary | ICD-10-CM | POA: Insufficient documentation

## 2023-03-04 DIAGNOSIS — Z87891 Personal history of nicotine dependence: Secondary | ICD-10-CM | POA: Insufficient documentation

## 2023-03-04 MED ORDER — HEPARIN SOD (PORK) LOCK FLUSH 100 UNIT/ML IV SOLN
500.0000 [IU] | Freq: Once | INTRAVENOUS | Status: AC
  Administered 2023-03-04: 500 [IU] via INTRAVENOUS

## 2023-03-04 MED ORDER — SODIUM CHLORIDE 0.9% FLUSH
10.0000 mL | Freq: Once | INTRAVENOUS | Status: AC
  Administered 2023-03-04: 10 mL via INTRAVENOUS

## 2023-03-05 ENCOUNTER — Other Ambulatory Visit: Payer: Self-pay

## 2023-03-05 ENCOUNTER — Ambulatory Visit: Payer: 59 | Admitting: Radiation Oncology

## 2023-03-07 ENCOUNTER — Other Ambulatory Visit: Payer: Self-pay

## 2023-03-08 ENCOUNTER — Ambulatory Visit: Payer: 59 | Admitting: Radiation Oncology

## 2023-03-10 ENCOUNTER — Ambulatory Visit: Payer: 59 | Admitting: Radiation Oncology

## 2023-03-12 ENCOUNTER — Ambulatory Visit: Payer: 59 | Admitting: Radiation Oncology

## 2023-03-15 ENCOUNTER — Ambulatory Visit: Payer: 59 | Admitting: Radiation Oncology

## 2023-03-15 ENCOUNTER — Ambulatory Visit
Admission: RE | Admit: 2023-03-15 | Discharge: 2023-03-15 | Disposition: A | Discharge status: Home / Self Care | Payer: 59 | Source: Ambulatory Visit | Attending: Radiation Oncology | Admitting: Radiation Oncology

## 2023-03-15 ENCOUNTER — Encounter: Payer: Self-pay | Admitting: Hematology

## 2023-03-15 DIAGNOSIS — C01 Malignant neoplasm of base of tongue: Secondary | ICD-10-CM | POA: Insufficient documentation

## 2023-03-15 DIAGNOSIS — Z87891 Personal history of nicotine dependence: Secondary | ICD-10-CM | POA: Diagnosis not present

## 2023-03-15 DIAGNOSIS — C787 Secondary malignant neoplasm of liver and intrahepatic bile duct: Secondary | ICD-10-CM | POA: Insufficient documentation

## 2023-03-16 ENCOUNTER — Other Ambulatory Visit: Payer: Self-pay

## 2023-03-16 DIAGNOSIS — C787 Secondary malignant neoplasm of liver and intrahepatic bile duct: Secondary | ICD-10-CM | POA: Diagnosis not present

## 2023-03-16 DIAGNOSIS — C01 Malignant neoplasm of base of tongue: Secondary | ICD-10-CM | POA: Diagnosis not present

## 2023-03-16 DIAGNOSIS — Z87891 Personal history of nicotine dependence: Secondary | ICD-10-CM | POA: Diagnosis not present

## 2023-03-16 LAB — RAD ONC ARIA SESSION SUMMARY
Course Elapsed Days: 0
Plan Fractions Treated to Date: 1
Plan Prescribed Dose Per Fraction: 12 Gy
Plan Total Fractions Prescribed: 5
Plan Total Prescribed Dose: 60 Gy
Reference Point Dosage Given to Date: 12 Gy
Reference Point Session Dosage Given: 12 Gy
Session Number: 1

## 2023-03-16 NOTE — Progress Notes (Signed)
 [facility] [address] [place] [address]    Clinic Day:  03/17/2023  Referring physician: [name], MD  Patient Care Team: [name], MD as PCP - General (Internal Medicine) [name], MD as Consulting Physician (Urology) [name], MD as Medical Oncologist (Medical Oncology) [name], RN as Oncology Nurse Navigator (Medical Oncology)   ASSESSMENT & PLAN:   Assessment: 1.  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  Enlarged contralateral right submandibular node.  Enlargement of the left submandibular gland probably reactive.  No abscess. - Biopsy (07/18/2021) floor of the mouth: Invasive well to moderately differentiated keratinizing squamous cell carcinoma.  Tumor cells negative for p16. - 10/14/2021: Floor of the mouth resection, tracheostomy, bilateral selective neck dissections zones 1-3 by Dr. [name] - Pathology: Invasive moderately differentiated keratinizing SCC, 4.1 cm, carcinoma invades for a depth of about 1.9 cm and involves saliva gland tissue.  Anterior, posterior, right, left resection margins are involved.  Deep resection margin is negative.  Metastatic carcinoma 1/3 level 1 lymph nodes.  3 level 2 and 3 level 3 lymph nodes negative for carcinoma.  0/10 lymph nodes involved in the left side neck zone 1, 2, 3 dissection.  ENE not identified.  LVI/perineural invasion not identified. - I have talked to pathologist.  Reexcision margins were considered negative.  He has a high risk feature which is T4 tumor. -  PET scan on 12/12/2021: Soft tissue thickening and calcification along the ventral aspect of the trachea with associated hypermetabolism corresponding to recent tracheostomy site.  7 mm right  paratracheal lymph node new with SUV 2.4.  Hypermetabolism along the dome of the right hepatic lobe with probable 2.4 cm low-attenuation lesion - Liver lesion biopsy (01/28/2022): Metastatic squamous cell carcinoma, keratinizing. - NGS testing: PD-L1 (22 C3): CPS: 100, CD274 (PD-L1) amplified, JAK2 amplified, PIK3CA pathogenic variant exon 10, T p53 pathogenic variant, MS-stable, TMB-low - Per Dr. [name], he also has local recurrence of disease in the floor of the mouth. - Keytruda from 03/10/2022 through 08/24/2022 with progression - XRT to the floor of the mouth on 06/29/2022 - PET scan 09/10/2022: Liver lesion increased in size measures 3.1 x 2.9 cm, previously 2.6 x 1.7 cm.  Right cervical lymph node meta stasis, mildly progressive. - HER2 by IHC 0 on biopsy from 01/28/2022. - Weekly cetuximab started on 10/07/2022 - PET scan (12/24/2022): Hypermetabolic anterior floor of mouth mass stable.  Stable right level 1B nodal metastasis.  Hypermetabolic 4.6 cm right liver dome metastasis increased in size by 8 mm.  No new sites of hypermetabolic metastatic disease. - MRI liver (01/07/2023): Right hepatic liver lesion measures 4.8 x 3.3 cm.  No new hepatic lesions seen.   2.  Social/family history: - He lives in his apartment by himself and is independent of ADLs and IADLs.  He does not drive.  He worked as a Curator and several other jobs.  Quit smoking 1 month ago.  He smoked 1 pack/week for more than 50 years. - 1 brother had agent orange related cancer.  Another brother also had cancer, type unknown to the patient.   3.  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 - He was on close surveillance rather  than adjuvant chemotherapy.    Plan: 1.  Stage IV (PT4PN1 M1) SCC of the floor of the mouth, p16 negative: - He started radiation to the liver lesion, 1/5 fractions on 03/16/2023.  He has another treatment tomorrow and 3 more treatments next week to complete the  radiation. - He is tolerating cetuximab very well. - Reviewed labs today: Normal LFTs and CBC.  TSH is 1.5. - He reports some burning sensation in the mouth and drooling of saliva.  Right cheek is swollen likely from lymphedema.  No clear lesions within the visualized part of oral cavity. - Recommend continuing cetuximab every 2 weeks.  RTC 4 weeks for follow-up.  Will plan to repeat PET scan for restaging.   2.  Oral pain: - Continue liquid hydrocodone twice daily as needed.  Pain is well-controlled.   3.  Cetuximab skin rash: - Continue doxycycline 100 mg twice daily and clindamycin gel twice daily.   4.  Hypotension: - Continue to hold antihypertensives.  Blood pressure is 104/90.   5.  Hypomagnesemia: - Continue magnesium 2 tablets twice daily.  Magnesium is better at 1.5.  He will receive IV magnesium.    Orders Placed This Encounter  Procedures   NM PET Image Restag (PS) Skull Base To Thigh    Standing Status:   Future    Expected Date:   04/17/2023    Expiration Date:   03/16/2024    If indicated for the ordered procedure, I authorize the administration of a radiopharmaceutical per Radiology protocol:   Yes    Preferred imaging location?:   [name]   CBC with Differential    Standing Status:   Future    Expected Date:   04/28/2023    Expiration Date:   04/27/2024   Comprehensive metabolic panel    Standing Status:   Future    Expected Date:   04/28/2023    Expiration Date:   04/27/2024   Magnesium    Standing Status:   Future    Expected Date:   04/28/2023    Expiration Date:   04/27/2024   CBC with Differential    Standing Status:   Future    Expected Date:   05/12/2023    Expiration Date:   05/11/2024   Comprehensive metabolic panel    Standing Status:   Future    Expected Date:   05/12/2023    Expiration Date:   05/11/2024   Magnesium    Standing Status:   Future    Expected Date:   05/12/2023    Expiration Date:   05/11/2024      I,[name],acting as a  scribe for [name], MD.,have documented all relevant documentation on the behalf of [name], MD,as directed by  [name], MD while in the presence of [name], MD.   I, [name] MD, have reviewed the above documentation for accuracy and completeness, and I agree with the above.   [name], MD   3/5/20251:43 PM  CHIEF COMPLAINT:   Diagnosis: squamous cell carcinoma of the floor of the mouth and testicular seminoma    Cancer Staging  Cancer of floor of mouth [facility]) Staging form: Oral Cavity, AJCC 8th Edition - Clinical stage from 11/25/2021: Stage IVC (cT4a, cN1, pM1) - Signed by [name], MD on 02/04/2022  Testicle cancer, right Staging form: Testis, AJCC 7th Edition - Clinical: Stage I (T2, N0, M0) - Signed by [name], PA-C on 07/23/2013  Prior Therapy: 1. Pembrolizumab, 03/10/22 - 08/24/22 2. XRT to floor of mouth, 06/17/22 - 08/19/22  Current Therapy:  Cetuximab weekly    HISTORY OF PRESENT ILLNESS:   Oncology History  Testicle cancer, right  12/20/2012 Surgery   Right total orchiectomyt- 1. Testis, biopsy, right - SEMINOMA. PLEASE SEE COMMENT. 2. Testis, tumor, right - SEMINOMA, 7.5 CM. - ANGIOLYMPHATIC INVASION PRESENT. - RESECTION MARGINS, NEGATIVE FOR ATYPIA OR MALIGNANCY.   01/18/2013 PET scan   Status post right orchiectomy. No findings specific for metastatic disease. Small para-aortic nodes measuring up to 5 mm short axis, without convincing hypermetabolism. Given location, attention on follow-up is suggested.   04/17/2013 Imaging   CT CAP- No evidence of metastatic disease in the chest, abdomen or pelvis. Proximal LAD coronary artery calcification.   07/20/2013 Imaging   CT abd/pelvis- No evidence of metastatic disease in the abdomen or pelvis.   10/24/2013 Imaging   CT abd/pelvis- No findings to suggest metastatic disease in the abdomen or pelvis   10/24/2013 Imaging   Chest xray-  Probable COPD.  Negative for metastatic disease.   02/21/2014 Imaging   CT abd/pelvis- Stable abdominal pelvic CT status post right orchectomy. No evidence of adenopathy or other metastatic disease.   02/21/2014 Imaging   Chest xray- Left lower lobe mild atelectasis and/or infiltrate.     09/10/2014 Imaging   CT abd/pelvis- Status post right orchiectomy.   No evidence of metastatic disease.   3 mm nonobstructing right upper pole renal calculus. No hydronephrosis.   03/14/2015 Imaging   CT abd/pelvis- Stable exam. No evidence of metastatic disease or other acute findings within the abdomen or pelvis.   09/13/2015 Imaging   CT abd/pelvis- No acute findings and no evidence for mass or adenopathy.    04/24/2016 Imaging   CT abd/pelvis: IMPRESSION: 1. Stable exam. No new or progressive findings. No features to suggest metastatic disease.   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HCC)  10/14/2021 Initial Diagnosis   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HCC)   11/25/2021 Cancer Staging   Staging form: Oral Cavity, AJCC 8th Edition - Clinical stage from 11/25/2021: Stage IVC (cT4a, cN1, pM1) - Signed by [name], MD on 02/04/2022 Histopathologic type: Squamous cell carcinoma, NOS Stage prefix: Initial diagnosis Histologic grade (G): G2 Histologic grading system: 3 grade system   03/10/2022 - 08/24/2022 Chemotherapy   Patient is on Treatment Plan : HEAD/NECK Pembrolizumab (200) q21d     10/07/2022 -  Chemotherapy   Patient is on Treatment Plan : HEAD/NECK Cetuximab q7d        INTERVAL HISTORY:   Weston is a 83 y.o. male presenting to clinic today for follow up of squamous cell carcinoma of the floor of the mouth and testicular seminoma. He was last seen by me on 03/03/23.  Of note since his last visit, he began SBRT yesterday, 03/16/23.  Today, he states that he is doing well overall. His appetite level is at 75%. His energy level is at 10%.  PAST MEDICAL HISTORY:   Past Medical History: Past Medical  History:  Diagnosis Date   Arthritis    BPH (benign prostatic hyperplasia)    Difficult intubation    HOH (hard of hearing)    Hyperlipidemia 11/02/2017   Hypertension    Hypertension 11/02/2017   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HCC)    2014   Vitamin D deficiency 11/02/2017    Surgical History: Past Surgical History:  Procedure Laterality Date   COLONOSCOPY N/A 11/24/2012   Procedure:  COLONOSCOPY;  Surgeon: Malissa Hippo, MD;  Location: [facility];  Service: Endoscopy;  Laterality: N/A;  830-moved to 730 Ann notified pt   FLOOR OF MOUTH BIOPSY N/A 10/14/2021   Procedure: FLOOR OF MOUTH RESECTION;  Surgeon: Christia Reading, MD;  Location: [facility] OR;  Service: ENT;  Laterality: N/A;   HEMORROIDECTOMY     KNEE ARTHROSCOPY WITH LATERAL MENISECTOMY Right 08/31/2017   Procedure: KNEE ARTHROSCOPY WITH LATERAL MENISECTOMY;  Surgeon: Vickki Hearing, MD;  Location: [facility]S;  Service: Orthopedics;  Laterality: Right;   LESION EXCISION N/A 03/23/2012   Procedure: EXCISION NEOPLASM SCALP ;  Surgeon: Dalia Heading, MD;  Location: [facility];  Service: General;  Laterality: N/A;  Excision of Scalp Neoplasm   ORCHIECTOMY Right 12/20/2012   Procedure: RIGHT RADICAL ORCHIECTOMY/POSSIBLE BX RIGHT TESTICLE;  Surgeon: Ky Barban, MD;  Location: [facility];  Service: Urology;  Laterality: Right;   PORTACATH PLACEMENT Left 03/25/2022   Procedure: INSERTION PORT-A-CATH;  Surgeon: Franky Macho, MD;  Location: [facility];  Service: General;  Laterality: Left;   PROSTATE SURGERY     RADICAL NECK DISSECTION Bilateral 10/14/2021   Procedure: NECK DISSECTION;  Surgeon: Christia Reading, MD;  Location: [facility] OR;  Service: ENT;  Laterality: Bilateral;   SCALP LACERATION REPAIR     [facility]-Dr Katrinka Blazing   SKIN FULL THICKNESS GRAFT Bilateral 10/14/2021   Procedure: PLATYSMA FLAP CLOSURE;  Surgeon: Christia Reading, MD;  Location: [facility] OR;  Service: ENT;  Laterality: Bilateral;   TOOTH EXTRACTION  10/14/2021   Procedure: DENTAL EXTRACTIONS;   Surgeon: Christia Reading, MD;  Location: [facility] OR;  Service: ENT;;   TRACHEOSTOMY TUBE PLACEMENT N/A 10/14/2021   Procedure: TRACHEOSTOMY;  Surgeon: Christia Reading, MD;  Location: [facility] OR;  Service: ENT;  Laterality: N/A;    Social History: Social History   Socioeconomic History   Marital status: Single    Spouse name: Not on file   Number of children: Not on file   Years of education: Not on file   Highest education level: Not on file  Occupational History   Not on file  Tobacco Use   Smoking status: Former    Current packs/day: 0.25    Average packs/day: 0.3 packs/day for 50.0 years (12.5 ttl pk-yrs)    Types: Cigarettes   Smokeless tobacco: Never  Vaping Use   Vaping status: Never Used  Substance and Sexual Activity   Alcohol use: Not Currently   Drug use: No   Sexual activity: Yes    Birth control/protection: None  Other Topics Concern   Not on file  Social History Narrative   Not on file   Social Drivers of Health   Financial Resource Strain: Not on file  Food Insecurity: Not on file  Transportation Needs: Not on file  Physical Activity: Not on file  Stress: Not on file  Social Connections: Not on file  Intimate Partner Violence: Not on file    Family History: Family History  Problem Relation Age of Onset   Cancer Brother    Cancer Brother     Current Medications:  Current Outpatient Medications:    acetaminophen (TYLENOL) 650 MG CR tablet, Take 650 mg by mouth every 8 (eight) hours as needed for pain., Disp: , Rfl:    amLODipine-olmesartan (AZOR) 5-20 MG tablet, TAKE 1 TABLET BY MOUTH DAILY, Disp: 90 tablet, Rfl: 0   budesonide-formoterol (SYMBICORT) 80-4.5 MCG/ACT inhaler, Inhale 2 puffs into the lungs in the morning and at bedtime., Disp: 1  each, Rfl: 12   Cholecalciferol (VITAMIN D3) 50 MCG (2000 UT) TABS, Take 2,000 Units by mouth daily., Disp: , Rfl:    ciprofloxacin-dexamethasone (CIPRODEX) OTIC suspension, Place 4 drops into the left ear 2 (two) times  daily., Disp: , Rfl:    clindamycin (CLINDAGEL) 1 % gel, Apply topically 2 (two) times daily. Apply twice daily to chest rash, Disp: 30 g, Rfl: 0   doxycycline (VIBRA-TABS) 100 MG tablet, Take 1 tablet (100 mg total) by mouth 2 (two) times daily., Disp: 60 tablet, Rfl: 3   HYDROcodone-acetaminophen (HYCET) 7.5-325 mg/15 ml solution, Take 15 mLs by mouth every 6 (six) hours as needed for moderate pain (pain score 4-6)., Disp: 473 mL, Rfl: 0   lidocaine (XYLOCAINE) 2 % solution, Take by mouth., Disp: , Rfl:    magnesium oxide (MAG-OX) 400 MG tablet, Take 2 tablets (800 mg total) by mouth 2 (two) times daily., Disp: 120 tablet, Rfl: 3   Menthol, Topical Analgesic, (BIOFREEZE EX), Apply 1 application  topically daily as needed (pain)., Disp: , Rfl:    naloxone (NARCAN) nasal spray 4 mg/0.1 mL, SMARTSIG:Both Nares, Disp: , Rfl:    Polyethyl Glycol-Propyl Glycol (GOODSENSE LUBRICANT EYE DROPS OP), Place 1 drop into both eyes daily as needed (dry eyes)., Disp: , Rfl:    pravastatin (PRAVACHOL) 80 MG tablet, Take 80 mg by mouth daily., Disp: , Rfl:    predniSONE (DELTASONE) 10 MG tablet, Take  4 each am x 2 days,   2 each am x 2 days,  1 each am x 2 days and stop, Disp: 14 tablet, Rfl: 0   RAPAFLO 8 MG CAPS capsule, Take 8 mg by mouth daily. Silodosin, Disp: , Rfl:    vitamin B-12 (CYANOCOBALAMIN) 500 MCG tablet, Take 500 mcg by mouth daily., Disp: , Rfl:  No current facility-administered medications for this visit.  Facility-Administered Medications Ordered in Other Visits:    heparin lock flush 100 unit/mL, 500 Units, Intracatheter, Once PRN, [name], MD   sodium chloride flush (NS) 0.9 % injection 10 mL, 10 mL, Intracatheter, PRN, [name], MD   Allergies: No Known Allergies  REVIEW OF SYSTEMS:   Review of Systems  Constitutional:  Negative for chills, fatigue and fever.  HENT:   Positive for trouble swallowing. Negative for lump/mass, mouth sores, nosebleeds and sore  throat.   Eyes:  Negative for eye problems.  Respiratory:  Negative for cough and shortness of breath.   Cardiovascular:  Negative for chest pain, leg swelling and palpitations.  Gastrointestinal:  Negative for abdominal pain, constipation, diarrhea, nausea and vomiting.  Genitourinary:  Negative for bladder incontinence, difficulty urinating, dysuria, frequency, hematuria and nocturia.   Musculoskeletal:  Negative for arthralgias, back pain, flank pain, myalgias and neck pain.  Skin:  Negative for itching and rash.  Neurological:  Negative for dizziness, headaches and numbness.  Hematological:  Does not bruise/bleed easily.  Psychiatric/Behavioral:  Negative for depression, sleep disturbance and suicidal ideas. The patient is not nervous/anxious.   All other systems reviewed and are negative.    VITALS:   Weight 154 lb (69.9 kg).  Wt Readings from Last 3 Encounters:  03/17/23 154 lb (69.9 kg)  03/04/23 160 lb 6 oz (72.7 kg)  03/03/23 156 lb 4.9 oz (70.9 kg)    Body mass index is 21.48 kg/m.  Performance status (ECOG): 1 - Symptomatic but completely ambulatory  PHYSICAL EXAM:   Physical Exam Vitals and nursing note reviewed. Exam conducted with a chaperone present.  Constitutional:      Appearance: Normal appearance.  Cardiovascular:     Rate and Rhythm: Normal rate and regular rhythm.     Pulses: Normal pulses.     Heart sounds: Normal heart sounds.  Pulmonary:     Effort: Pulmonary effort is normal.     Breath sounds: Normal breath sounds.  Abdominal:     Palpations: Abdomen is soft. There is no hepatomegaly, splenomegaly or mass.     Tenderness: There is no abdominal tenderness.  Musculoskeletal:     Right lower leg: No edema.     Left lower leg: No edema.  Lymphadenopathy:     Cervical: No cervical adenopathy.     Right cervical: No superficial, deep or posterior cervical adenopathy.    Left cervical: No superficial, deep or posterior cervical adenopathy.      Upper Body:     Right upper body: No supraclavicular or axillary adenopathy.     Left upper body: No supraclavicular or axillary adenopathy.  Neurological:     General: No focal deficit present.     Mental Status: He is alert and oriented to person, place, and time.  Psychiatric:        Mood and Affect: Mood normal.        Behavior: Behavior normal.     LABS:   CBC     Component Value Date/Time   WBC 8.7 03/17/2023 0938   RBC 3.26 (L) 03/17/2023 0938   HGB 10.6 (L) 03/17/2023 0938   HCT 33.3 (L) 03/17/2023 0938   PLT 313 03/17/2023 0938   MCV 102.1 (H) 03/17/2023 0938   MCH 32.5 03/17/2023 0938   MCHC 31.8 03/17/2023 0938   RDW 13.9 03/17/2023 0938   LYMPHSABS 0.8 03/17/2023 0938   MONOABS 0.9 03/17/2023 0938   EOSABS 0.2 03/17/2023 0938   BASOSABS 0.1 03/17/2023 0938    CMP      Component Value Date/Time   NA 136 03/17/2023 0938   K 4.0 03/17/2023 0938   CL 101 03/17/2023 0938   CO2 27 03/17/2023 0938   GLUCOSE 118 (H) 03/17/2023 0938   BUN 12 03/17/2023 0938   CREATININE 0.75 03/17/2023 0938   CALCIUM 10.3 03/17/2023 0938   PROT 7.0 03/17/2023 0938   ALBUMIN 3.0 (L) 03/17/2023 0938   AST 22 03/17/2023 0938   ALT 15 03/17/2023 0938   ALKPHOS 46 03/17/2023 0938   BILITOT 0.8 03/17/2023 0938   GFRNONAA >60 03/17/2023 0938   GFRAA >60 10/26/2018 1137     Lab Results  Component Value Date   CEA 2.2 01/19/2013   /  CEA  Date Value Ref Range Status  01/19/2013 2.2 0.0 - 5.0 ng/mL Final    Comment:    Performed at [facility]   No results found for: "PSA1" No results found for: "CAN199" No results found for: "CAN125"  No results found for: "TOTALPROTELP", "ALBUMINELP", "A1GS", "A2GS", "BETS", "BETA2SER", "GAMS", "MSPIKE", "SPEI" Lab Results  Component Value Date   TIBC 244 (L) 08/04/2022   TIBC 251 05/12/2022   FERRITIN 273 08/04/2022   FERRITIN 195 05/12/2022   IRONPCTSAT 19 08/04/2022   IRONPCTSAT 18 05/12/2022   Lab Results   Component Value Date   LDH 172 11/12/2020   LDH 175 10/26/2018   LDH 179 10/25/2013     STUDIES:   CT US GUIDED BIOPSY Result Date: 03/02/2023 INDICATION: Fiducial Marker Placement below and along medial/lateral aspects of liver metastasis in the right hepatic lobe; liver  mass EXAM: CT-GUIDED, ULTRASOUND-ASSISTED LIVER MASS FIDUCIAL MARKER PLACEMENT COMPARISON:  PET-CT, 01/01/2023.  MR abdomen, 01/07/2023. MEDICATIONS: 25 mg Benadryl IV. ANESTHESIA/SEDATION: Moderate (conscious) sedation was employed during this procedure. A total of Versed 1 mg and Fentanyl 75 mcg was administered intravenously. Moderate Sedation Time: 39 minutes. The patient's level of consciousness and vital signs were monitored continuously by radiology nursing throughout the procedure under my direct supervision. CONTRAST:  None. COMPLICATIONS: None immediate. PROCEDURE: RADIATION DOSE REDUCTION: This exam was performed according to the departmental dose-optimization program which includes automated exposure control, adjustment of the mA and/or kV according to patient size and/or use of iterative reconstruction technique. Informed consent was obtained from the patient following an explanation of the procedure, risks, benefits and alternatives. A time out was performed prior to the initiation of the procedure. The patient was positioned supine on the CT table and a limited CT was performed for procedural planning demonstrating an approximately 5 cm hypodense superior RIGHT hepatic lobe mass. Ultrasound scanning was performed, demonstrating a heterogeneously hypoechoic rounded mass within the region of interest The procedure was planned. The operative site was prepped and draped in the usual sterile fashion. Appropriate trajectory was confirmed with a 22 gauge spinal needle after the adjacent tissues were anesthetized with 1% Lidocaine. Under direct sonographic and intermittent CT guidance a 15-cm, 21-G access needle was inserted  towards the mass lesion. The tip of the needle was initially positioned superior to the tumor. At this position, a coil as a fiducial marker was successfully placed. The needle was removed and a new 15 cm 21 gauge access needle was inserted inferior to the mass allowing for placement of an additional fiducial marker the needle slightly pulled back and a final marker was placed. Four fiducial markers were identified and confirmed properly positioned on the completion CT images. The needle was removed and superficial hemostasis was obtained by manual compression. A limited postprocedural CT was negative for hemorrhage or additional complication. A dressing was placed. The patient tolerated the procedure well without immediate postprocedural complication. FINDINGS: *Superior RIGHT hepatic lobe, hepatic segment VII/VIII, approximately 5 cm mass was targeted for marker placement. *[employer] (2 mm x 5 mm) coils were substituted for fiducials *Four (4) fiducial markers were deployed placed near the superior margin, superomedial, inferior, and inferior medial to the lesion. IMPRESSION: Successful CT-guided, ultrasound-assisted liver mass fiducial marker placement. Markers positioned, as described above. Roanna Banning, MD [facility] Radiology Electronically Signed   By: Roanna Banning M.D.   On: 03/02/2023 17:23   CT GUIDED NEEDLE PLACEMENT Result Date: 03/02/2023 INDICATION: Fiducial Marker Placement below and along medial/lateral aspects of liver metastasis in the right hepatic lobe; liver mass EXAM: CT-GUIDED, ULTRASOUND-ASSISTED LIVER MASS FIDUCIAL MARKER PLACEMENT COMPARISON:  PET-CT, 01/01/2023.  MR abdomen, 01/07/2023. MEDICATIONS: 25 mg Benadryl IV. ANESTHESIA/SEDATION: Moderate (conscious) sedation was employed during this procedure. A total of Versed 1 mg and Fentanyl 75 mcg was administered intravenously. Moderate Sedation Time: 39 minutes. The patient's  level of consciousness and vital signs were monitored continuously by radiology nursing throughout the procedure under my direct supervision. CONTRAST:  None. COMPLICATIONS: None immediate. PROCEDURE: RADIATION DOSE REDUCTION: This exam was performed according to the departmental dose-optimization program which includes automated exposure control, adjustment of the mA and/or kV according to patient size and/or use of iterative reconstruction technique. Informed consent was obtained from the patient following an explanation of the procedure, risks, benefits and alternatives. A time out was performed prior to  the initiation of the procedure. The patient was positioned supine on the CT table and a limited CT was performed for procedural planning demonstrating an approximately 5 cm hypodense superior RIGHT hepatic lobe mass. Ultrasound scanning was performed, demonstrating a heterogeneously hypoechoic rounded mass within the region of interest The procedure was planned. The operative site was prepped and draped in the usual sterile fashion. Appropriate trajectory was confirmed with a 22 gauge spinal needle after the adjacent tissues were anesthetized with 1% Lidocaine. Under direct sonographic and intermittent CT guidance a 15-cm, 21-G access needle was inserted towards the mass lesion. The tip of the needle was initially positioned superior to the tumor. At this position, a coil as a fiducial marker was successfully placed. The needle was removed and a new 15 cm 21 gauge access needle was inserted inferior to the mass allowing for placement of an additional fiducial marker the needle slightly pulled back and a final marker was placed. Four fiducial markers were identified and confirmed properly positioned on the completion CT images. The needle was removed and superficial hemostasis was obtained by manual compression. A limited postprocedural CT was negative for hemorrhage or additional complication. A dressing was  placed. The patient tolerated the procedure well without immediate postprocedural complication. FINDINGS: *Superior RIGHT hepatic lobe, hepatic segment VII/VIII, approximately 5 cm mass was targeted for marker placement. *[employer] (2 mm x 5 mm) coils were substituted for fiducials *Four (4) fiducial markers were deployed placed near the superior margin, superomedial, inferior, and inferior medial to the lesion. IMPRESSION: Successful CT-guided, ultrasound-assisted liver mass fiducial marker placement. Markers positioned, as described above. Roanna Banning, MD [facility] Radiology Electronically Signed   By: Roanna Banning M.D.   On: 03/02/2023 17:23

## 2023-03-17 ENCOUNTER — Inpatient Hospital Stay: Payer: 59 | Attending: Hematology

## 2023-03-17 ENCOUNTER — Inpatient Hospital Stay (HOSPITAL_BASED_OUTPATIENT_CLINIC_OR_DEPARTMENT_OTHER): Payer: 59 | Admitting: Hematology

## 2023-03-17 ENCOUNTER — Ambulatory Visit: Payer: 59 | Admitting: Radiation Oncology

## 2023-03-17 ENCOUNTER — Inpatient Hospital Stay: Payer: 59

## 2023-03-17 VITALS — BP 144/67 | HR 91 | Temp 97.7°F | Resp 20

## 2023-03-17 VITALS — Wt 154.0 lb

## 2023-03-17 DIAGNOSIS — I959 Hypotension, unspecified: Secondary | ICD-10-CM | POA: Diagnosis not present

## 2023-03-17 DIAGNOSIS — C787 Secondary malignant neoplasm of liver and intrahepatic bile duct: Secondary | ICD-10-CM | POA: Insufficient documentation

## 2023-03-17 DIAGNOSIS — R21 Rash and other nonspecific skin eruption: Secondary | ICD-10-CM | POA: Diagnosis not present

## 2023-03-17 DIAGNOSIS — Z8547 Personal history of malignant neoplasm of testis: Secondary | ICD-10-CM | POA: Diagnosis not present

## 2023-03-17 DIAGNOSIS — Z5111 Encounter for antineoplastic chemotherapy: Secondary | ICD-10-CM | POA: Insufficient documentation

## 2023-03-17 DIAGNOSIS — C049 Malignant neoplasm of floor of mouth, unspecified: Secondary | ICD-10-CM

## 2023-03-17 DIAGNOSIS — Z87891 Personal history of nicotine dependence: Secondary | ICD-10-CM | POA: Insufficient documentation

## 2023-03-17 LAB — CBC WITH DIFFERENTIAL/PLATELET
Abs Immature Granulocytes: 0.03 10*3/uL (ref 0.00–0.07)
Basophils Absolute: 0.1 10*3/uL (ref 0.0–0.1)
Basophils Relative: 1 %
Eosinophils Absolute: 0.2 10*3/uL (ref 0.0–0.5)
Eosinophils Relative: 2 %
HCT: 33.3 % — ABNORMAL LOW (ref 39.0–52.0)
Hemoglobin: 10.6 g/dL — ABNORMAL LOW (ref 13.0–17.0)
Immature Granulocytes: 0 %
Lymphocytes Relative: 9 %
Lymphs Abs: 0.8 10*3/uL (ref 0.7–4.0)
MCH: 32.5 pg (ref 26.0–34.0)
MCHC: 31.8 g/dL (ref 30.0–36.0)
MCV: 102.1 fL — ABNORMAL HIGH (ref 80.0–100.0)
Monocytes Absolute: 0.9 10*3/uL (ref 0.1–1.0)
Monocytes Relative: 10 %
Neutro Abs: 6.8 10*3/uL (ref 1.7–7.7)
Neutrophils Relative %: 78 %
Platelets: 313 10*3/uL (ref 150–400)
RBC: 3.26 MIL/uL — ABNORMAL LOW (ref 4.22–5.81)
RDW: 13.9 % (ref 11.5–15.5)
WBC: 8.7 10*3/uL (ref 4.0–10.5)
nRBC: 0 % (ref 0.0–0.2)

## 2023-03-17 LAB — COMPREHENSIVE METABOLIC PANEL
ALT: 15 U/L (ref 0–44)
AST: 22 U/L (ref 15–41)
Albumin: 3 g/dL — ABNORMAL LOW (ref 3.5–5.0)
Alkaline Phosphatase: 46 U/L (ref 38–126)
Anion gap: 8 (ref 5–15)
BUN: 12 mg/dL (ref 8–23)
CO2: 27 mmol/L (ref 22–32)
Calcium: 10.3 mg/dL (ref 8.9–10.3)
Chloride: 101 mmol/L (ref 98–111)
Creatinine, Ser: 0.75 mg/dL (ref 0.61–1.24)
GFR, Estimated: >60 mL/min (ref >60)
Glucose, Bld: 118 mg/dL — ABNORMAL HIGH (ref 70–99)
Potassium: 4 mmol/L (ref 3.5–5.1)
Sodium: 136 mmol/L (ref 135–145)
Total Bilirubin: 0.8 mg/dL (ref 0.0–1.2)
Total Protein: 7 g/dL (ref 6.5–8.1)

## 2023-03-17 LAB — MAGNESIUM: Magnesium: 1.5 mg/dL — ABNORMAL LOW (ref 1.7–2.4)

## 2023-03-17 MED ORDER — SODIUM CHLORIDE 0.9% FLUSH
10.0000 mL | INTRAVENOUS | Status: DC | PRN
Start: 2023-03-17 — End: 2023-03-17
  Administered 2023-03-17: 10 mL

## 2023-03-17 MED ORDER — SODIUM CHLORIDE 0.9 % IV SOLN: Freq: Once | INTRAVENOUS | Status: AC

## 2023-03-17 MED ORDER — MAGNESIUM SULFATE 2 GM/50ML IV SOLN
2.0000 g | Freq: Once | INTRAVENOUS | Status: AC
  Administered 2023-03-17: 2 g via INTRAVENOUS
  Filled 2023-03-17: qty 50

## 2023-03-17 MED ORDER — CETUXIMAB CHEMO IV INJECTION 200 MG/100ML
500.0000 mg/m2 | Freq: Once | INTRAVENOUS | Status: AC
  Administered 2023-03-17: 900 mg via INTRAVENOUS
  Filled 2023-03-17: qty 400

## 2023-03-17 MED ORDER — FAMOTIDINE IN NACL 20-0.9 MG/50ML-% IV SOLN
20.0000 mg | Freq: Once | INTRAVENOUS | Status: AC
Start: 2023-03-17 — End: 2023-03-17
  Administered 2023-03-17: 20 mg via INTRAVENOUS
  Filled 2023-03-17: qty 50

## 2023-03-17 MED ORDER — DIPHENHYDRAMINE HCL 50 MG/ML IJ SOLN
50.0000 mg | Freq: Once | INTRAMUSCULAR | Status: AC
  Administered 2023-03-17: 50 mg via INTRAVENOUS
  Filled 2023-03-17: qty 1

## 2023-03-17 MED ORDER — HEPARIN SOD (PORK) LOCK FLUSH 100 UNIT/ML IV SOLN
500.0000 [IU] | Freq: Once | INTRAVENOUS | Status: AC | PRN
Start: 2023-03-17 — End: 2023-03-17
  Administered 2023-03-17: 500 [IU]

## 2023-03-17 MED ORDER — METHYLPREDNISOLONE SODIUM SUCC 125 MG IJ SOLR
125.0000 mg | Freq: Once | INTRAMUSCULAR | Status: AC
  Administered 2023-03-17: 125 mg via INTRAVENOUS
  Filled 2023-03-17: qty 2

## 2023-03-17 NOTE — Progress Notes (Signed)
 Patient has been examined by Dr. Ellin Saba. Vital signs and labs have been reviewed by MD - ANC, Creatinine, LFTs, hemoglobin, and platelets are within treatment parameters per M.D. - pt may proceed with treatment.  Primary RN and pharmacy notified.

## 2023-03-17 NOTE — Patient Instructions (Signed)
 [facility] [place] - A DEPT OF [facility]  Discharge Instructions: Thank you for choosing [facility] to provide your oncology and hematology care.  If you have a lab appointment with the Cancer Center - please note that after April 8th, 2024, all labs will be drawn in the cancer center.  You do not have to check in or register with the main entrance as you have in the past but will complete your check-in in the cancer center.  Wear comfortable clothing and clothing appropriate for easy access to any Portacath or PICC line.   We strive to give you quality time with your provider. You may need to reschedule your appointment if you arrive late (15 or more minutes).  Arriving late affects you and other patients whose appointments are after yours.  Also, if you miss three or more appointments without notifying the office, you may be dismissed from the clinic at the provider's discretion.      For prescription refill requests, have your pharmacy contact our office and allow 72 hours for refills to be completed.    Today you received the following chemotherapy and/or immunotherapy agents Erbitux.  Cetuximab Injection What is this medication? CETUXIMAB (se TUX i mab) treats head and neck cancer. It may also be used to treat colorectal cancer. It works by blocking a protein that causes cancer cells to grow and multiply. This helps to slow or stop the spread of cancer cells. It is a monoclonal antibody. This medicine may be used for other purposes; ask your health care provider or pharmacist if you have questions. COMMON BRAND NAME(S): Erbitux What should I tell my care team before I take this medication? They need to know if you have any of these conditions: Heart disease History of tick bites Low levels of calcium, magnesium, or potassium in the blood Lung disease Red meat allergy An unusual or allergic reaction to cetuximab, other medications, foods, dyes,  or preservatives Pregnant or trying to get pregnant Breast-feeding How should I use this medication? This medication is infused into a vein. It is given by your care team in a hospital or clinic setting. Talk to your care team about the use of this medication in children. Special care may be needed. Overdosage: If you think you have taken too much of this medicine contact a poison control center or emergency room at once. NOTE: This medicine is only for you. Do not share this medicine with others. What if I miss a dose? Keep appointments for follow-up doses. It is important not to miss your dose. Call your care team if you are unable to keep an appointment. What may interact with this medication? Interactions are not expected. This list may not describe all possible interactions. Give your health care provider a list of all the medicines, herbs, non-prescription drugs, or dietary supplements you use. Also tell them if you smoke, drink alcohol, or use illegal drugs. Some items may interact with your medicine. What should I watch for while using this medication? Visit your care team for regular checks on your progress. This medication may make you feel generally unwell. This is not uncommon, as chemotherapy can affect healthy cells as well as cancer cells. Report any side effects. Continue your course of treatment even though you feel ill unless your care team tells you to stop. You may need blood work done while you are taking this medication. This medication can make you more sensitive  to the sun. Keep out of the sun while taking this medication and for 2 months after the last dose. If you cannot avoid being in the sun, wear protective clothing and sunscreen. Do not use sun lamps, tanning beds, or tanning booths. This medication can cause serious infusion reactions. To reduce the risk, your care team may give you other medications to take before receiving this one. Be sure to follow the directions of  your care team. This medication may cause serious skin reactions. They can happen weeks to months after starting the medication. Contact your care team right away if you notice fevers or flu-like symptoms with a rash. The rash may be red or purple and then turn into blisters or peeling of the skin. You may also notice a red rash with swelling of the face, lips, or lymph nodes in your neck or under your arms. Talk to your care team if you may be pregnant. Serious birth defects can occur if you take this medication during pregnancy and for 2 months after the last dose. You will need a negative pregnancy test before starting this medication. Contraception is recommended while taking this medication and for 2 months after the last dose. Your care team can help you find the option that works for you. Do not breastfeed while taking this medication and for 2 months after the last dose. This medication may cause infertility. Talk to your care team if you are concerned about your fertility. What side effects may I notice from receiving this medication? Side effects that you should report to your care team as soon as possible: Allergic reactions--skin rash, itching, hives, swelling of the face, lips, tongue, or throat Dry cough, shortness of breath or trouble breathing Heart attack--pain or tightness in the chest, shoulders, arms, or jaw, nausea, shortness of breath, cold or clammy skin, feeling faint or lightheaded Infusion reactions--chest pain, shortness of breath or trouble breathing, feeling faint or lightheaded Low calcium level--muscle pain or cramps, confusion, tingling, or numbness in the hands or feet Low magnesium level--muscle pain or cramps, unusual weakness or fatigue, fast or irregular heartbeat, tremors Low potassium level--muscle pain or cramps, unusual weakness or fatigue, fast or irregular heartbeat, constipation Redness, blistering, peeling, or loosening of the skin, including inside the  mouth Side effects that usually do not require medical attention (report to your care team if they continue or are bothersome): Diarrhea Headache Joint pain Nausea Unusual weakness or fatigue Weight loss This list may not describe all possible side effects. Call your doctor for medical advice about side effects. You may report side effects to FDA at 1-800-FDA-1088. Where should I keep my medication? This medication is given in a hospital or clinic. It will not be stored at home. NOTE: This sheet is a summary. It may not cover all possible information. If you have questions about this medicine, talk to your doctor, pharmacist, or health care provider.  2024 Elsevier/Gold Standard (2021-05-16 00:00:00)       To help prevent nausea and vomiting after your treatment, we encourage you to take your nausea medication as directed.  BELOW ARE SYMPTOMS THAT SHOULD BE REPORTED IMMEDIATELY: *FEVER GREATER THAN 100.4 F (38 C) OR HIGHER *CHILLS OR SWEATING *NAUSEA AND VOMITING THAT IS NOT CONTROLLED WITH YOUR NAUSEA MEDICATION *UNUSUAL SHORTNESS OF BREATH *UNUSUAL BRUISING OR BLEEDING *URINARY PROBLEMS (pain or burning when urinating, or frequent urination) *BOWEL PROBLEMS (unusual diarrhea, constipation, pain near the anus) TENDERNESS IN MOUTH AND THROAT WITH OR WITHOUT PRESENCE  OF ULCERS (sore throat, sores in mouth, or a toothache) UNUSUAL RASH, SWELLING OR PAIN  UNUSUAL VAGINAL DISCHARGE OR ITCHING   Items with * indicate a potential emergency and should be followed up as soon as possible or go to the Emergency Department if any problems should occur.  Please show the CHEMOTHERAPY ALERT CARD or IMMUNOTHERAPY ALERT CARD at check-in to the Emergency Department and triage nurse.  Should you have questions after your visit or need to cancel or reschedule your appointment, please contact [facility] [place] - A DEPT OF Eligha Bridegroom [facility] [phone]  and follow the prompts.   Office hours are 8:00 a.m. to 4:30 p.m. Monday - Friday. Please note that voicemails left after 4:00 p.m. may not be returned until the following business day.  We are closed weekends and major holidays. You have access to a nurse at all times for urgent questions. Please call the main number to the clinic [phone] and follow the prompts.  For any non-urgent questions, you may also contact your provider using MyChart. We now offer e-Visits for anyone 82 and older to request care online for non-urgent symptoms. For details visit [email or web address].   Also download the MyChart app! Go to the app store, search "MyChart", open the app, select [place], and log in with your MyChart username and password.

## 2023-03-17 NOTE — Patient Instructions (Signed)

## 2023-03-17 NOTE — Progress Notes (Signed)
 Message received from A.Dareen Piano RN / Dr. Ellin Saba to proceed with treatment. Labs reviewed by MD. Magnesium level 1.5 today. Standing orders followed. Patient will receive 2 grams of Magnesium Sulfate. Patient denies any side effects related to the last treatment. No complaints noted today.   Treatment given today per MD orders. Tolerated infusion without adverse affects. Vital signs stable. No complaints at this time. Discharged from clinic ambulatory in stable condition. Alert and oriented x 3. F/U with [facility] as scheduled.

## 2023-03-18 ENCOUNTER — Other Ambulatory Visit: Payer: Self-pay

## 2023-03-18 ENCOUNTER — Ambulatory Visit
Admission: RE | Admit: 2023-03-18 | Discharge: 2023-03-18 | Disposition: A | Discharge status: Home / Self Care | Payer: 59 | Source: Ambulatory Visit | Attending: Radiation Oncology | Admitting: Radiation Oncology

## 2023-03-18 DIAGNOSIS — C01 Malignant neoplasm of base of tongue: Secondary | ICD-10-CM | POA: Diagnosis not present

## 2023-03-18 DIAGNOSIS — C787 Secondary malignant neoplasm of liver and intrahepatic bile duct: Secondary | ICD-10-CM | POA: Diagnosis not present

## 2023-03-18 DIAGNOSIS — Z87891 Personal history of nicotine dependence: Secondary | ICD-10-CM | POA: Diagnosis not present

## 2023-03-18 LAB — RAD ONC ARIA SESSION SUMMARY
Course Elapsed Days: 2
Plan Fractions Treated to Date: 2
Plan Prescribed Dose Per Fraction: 12 Gy
Plan Total Fractions Prescribed: 5
Plan Total Prescribed Dose: 60 Gy
Reference Point Dosage Given to Date: 24 Gy
Reference Point Session Dosage Given: 12 Gy
Session Number: 2

## 2023-03-19 ENCOUNTER — Ambulatory Visit: Payer: 59 | Admitting: Radiation Oncology

## 2023-03-19 ENCOUNTER — Encounter: Payer: Self-pay | Admitting: Hematology

## 2023-03-22 ENCOUNTER — Ambulatory Visit
Admission: RE | Admit: 2023-03-22 | Discharge: 2023-03-22 | Disposition: A | Discharge status: Home / Self Care | Payer: 59 | Source: Ambulatory Visit | Attending: Radiation Oncology | Admitting: Radiation Oncology

## 2023-03-22 ENCOUNTER — Other Ambulatory Visit: Payer: Self-pay

## 2023-03-22 DIAGNOSIS — C01 Malignant neoplasm of base of tongue: Secondary | ICD-10-CM | POA: Diagnosis not present

## 2023-03-22 DIAGNOSIS — C787 Secondary malignant neoplasm of liver and intrahepatic bile duct: Secondary | ICD-10-CM | POA: Diagnosis not present

## 2023-03-22 DIAGNOSIS — Z87891 Personal history of nicotine dependence: Secondary | ICD-10-CM | POA: Diagnosis not present

## 2023-03-22 LAB — RAD ONC ARIA SESSION SUMMARY
Course Elapsed Days: 6
Plan Fractions Treated to Date: 3
Plan Prescribed Dose Per Fraction: 12 Gy
Plan Total Fractions Prescribed: 5
Plan Total Prescribed Dose: 60 Gy
Reference Point Dosage Given to Date: 36 Gy
Reference Point Session Dosage Given: 12 Gy
Session Number: 3

## 2023-03-24 ENCOUNTER — Encounter: Payer: Self-pay | Admitting: Hematology

## 2023-03-24 ENCOUNTER — Other Ambulatory Visit: Payer: Self-pay

## 2023-03-24 ENCOUNTER — Ambulatory Visit

## 2023-03-24 ENCOUNTER — Ambulatory Visit
Admission: RE | Admit: 2023-03-24 | Discharge: 2023-03-24 | Disposition: A | Discharge status: Home / Self Care | Payer: 59 | Source: Ambulatory Visit | Attending: Radiation Oncology | Admitting: Radiation Oncology

## 2023-03-24 DIAGNOSIS — Z87891 Personal history of nicotine dependence: Secondary | ICD-10-CM | POA: Diagnosis not present

## 2023-03-24 DIAGNOSIS — C01 Malignant neoplasm of base of tongue: Secondary | ICD-10-CM | POA: Diagnosis not present

## 2023-03-24 DIAGNOSIS — C787 Secondary malignant neoplasm of liver and intrahepatic bile duct: Secondary | ICD-10-CM | POA: Diagnosis not present

## 2023-03-24 LAB — RAD ONC ARIA SESSION SUMMARY
Course Elapsed Days: 8
Plan Fractions Treated to Date: 4
Plan Prescribed Dose Per Fraction: 12 Gy
Plan Total Fractions Prescribed: 5
Plan Total Prescribed Dose: 60 Gy
Reference Point Dosage Given to Date: 48 Gy
Reference Point Session Dosage Given: 12 Gy
Session Number: 4

## 2023-03-24 NOTE — Progress Notes (Addendum)
  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 The patient has received 48 Gy of the planned 60 Gy. He will finish treatment on Friday.  Narrative: The patient has been tolerating therapy without nausea or bowel dysfunction.  Plan:  The patient will complete radiation this Friday. He will receive a call in about one month from the radiation oncology department. He will continue follow up with Dr. Ellin Saba as well.      Osker Mason, PAC

## 2023-03-26 ENCOUNTER — Other Ambulatory Visit: Payer: Self-pay

## 2023-03-26 ENCOUNTER — Ambulatory Visit
Admission: RE | Admit: 2023-03-26 | Discharge: 2023-03-26 | Disposition: A | Discharge status: Home / Self Care | Payer: 59 | Source: Ambulatory Visit | Attending: Radiation Oncology | Admitting: Radiation Oncology

## 2023-03-26 DIAGNOSIS — Z510 Encounter for antineoplastic radiation therapy: Secondary | ICD-10-CM | POA: Diagnosis not present

## 2023-03-26 DIAGNOSIS — C787 Secondary malignant neoplasm of liver and intrahepatic bile duct: Secondary | ICD-10-CM | POA: Diagnosis not present

## 2023-03-26 DIAGNOSIS — C01 Malignant neoplasm of base of tongue: Secondary | ICD-10-CM | POA: Diagnosis not present

## 2023-03-26 DIAGNOSIS — Z87891 Personal history of nicotine dependence: Secondary | ICD-10-CM | POA: Diagnosis not present

## 2023-03-26 LAB — RAD ONC ARIA SESSION SUMMARY
Course Elapsed Days: 10
Plan Fractions Treated to Date: 5
Plan Prescribed Dose Per Fraction: 12 Gy
Plan Total Fractions Prescribed: 5
Plan Total Prescribed Dose: 60 Gy
Reference Point Dosage Given to Date: 60 Gy
Reference Point Session Dosage Given: 12 Gy
Session Number: 5

## 2023-03-29 NOTE — Radiation Completion Notes (Addendum)
  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 The patient tolerated radiation.    The patient will receive a call in about one month from the radiation oncology department. He will continue follow up with Dr. Ellin Saba as well.      Osker Mason, PAC

## 2023-03-31 ENCOUNTER — Inpatient Hospital Stay: Payer: 59

## 2023-03-31 ENCOUNTER — Other Ambulatory Visit: Payer: Self-pay

## 2023-03-31 ENCOUNTER — Inpatient Hospital Stay

## 2023-03-31 ENCOUNTER — Inpatient Hospital Stay: Payer: 59 | Admitting: Hematology

## 2023-03-31 ENCOUNTER — Other Ambulatory Visit (HOSPITAL_COMMUNITY): Payer: Self-pay | Admitting: *Deleted

## 2023-03-31 VITALS — BP 129/54 | HR 78 | Temp 97.3°F | Resp 18 | Wt 153.4 lb

## 2023-03-31 DIAGNOSIS — Z87891 Personal history of nicotine dependence: Secondary | ICD-10-CM | POA: Diagnosis not present

## 2023-03-31 DIAGNOSIS — C049 Malignant neoplasm of floor of mouth, unspecified: Secondary | ICD-10-CM

## 2023-03-31 DIAGNOSIS — R21 Rash and other nonspecific skin eruption: Secondary | ICD-10-CM | POA: Diagnosis not present

## 2023-03-31 DIAGNOSIS — C787 Secondary malignant neoplasm of liver and intrahepatic bile duct: Secondary | ICD-10-CM | POA: Diagnosis not present

## 2023-03-31 DIAGNOSIS — I959 Hypotension, unspecified: Secondary | ICD-10-CM | POA: Diagnosis not present

## 2023-03-31 DIAGNOSIS — Z5111 Encounter for antineoplastic chemotherapy: Secondary | ICD-10-CM | POA: Diagnosis not present

## 2023-03-31 LAB — CBC WITH DIFFERENTIAL/PLATELET
Abs Immature Granulocytes: 0.04 10*3/uL (ref 0.00–0.07)
Basophils Absolute: 0 10*3/uL (ref 0.0–0.1)
Basophils Relative: 1 %
Eosinophils Absolute: 0.2 10*3/uL (ref 0.0–0.5)
Eosinophils Relative: 3 %
HCT: 31.9 % — ABNORMAL LOW (ref 39.0–52.0)
Hemoglobin: 10.2 g/dL — ABNORMAL LOW (ref 13.0–17.0)
Immature Granulocytes: 1 %
Lymphocytes Relative: 6 %
Lymphs Abs: 0.4 10*3/uL — ABNORMAL LOW (ref 0.7–4.0)
MCH: 32.5 pg (ref 26.0–34.0)
MCHC: 32 g/dL (ref 30.0–36.0)
MCV: 101.6 fL — ABNORMAL HIGH (ref 80.0–100.0)
Monocytes Absolute: 0.9 10*3/uL (ref 0.1–1.0)
Monocytes Relative: 14 %
Neutro Abs: 5 10*3/uL (ref 1.7–7.7)
Neutrophils Relative %: 75 %
Platelets: 222 10*3/uL (ref 150–400)
RBC: 3.14 MIL/uL — ABNORMAL LOW (ref 4.22–5.81)
RDW: 13.3 % (ref 11.5–15.5)
WBC: 6.6 10*3/uL (ref 4.0–10.5)
nRBC: 0 % (ref 0.0–0.2)

## 2023-03-31 LAB — COMPREHENSIVE METABOLIC PANEL
ALT: 14 U/L (ref 0–44)
AST: 23 U/L (ref 15–41)
Albumin: 2.8 g/dL — ABNORMAL LOW (ref 3.5–5.0)
Alkaline Phosphatase: 43 U/L (ref 38–126)
Anion gap: 10 (ref 5–15)
BUN: 12 mg/dL (ref 8–23)
CO2: 26 mmol/L (ref 22–32)
Calcium: 10.3 mg/dL (ref 8.9–10.3)
Chloride: 100 mmol/L (ref 98–111)
Creatinine, Ser: 0.72 mg/dL (ref 0.61–1.24)
GFR, Estimated: >60 mL/min (ref >60)
Glucose, Bld: 92 mg/dL (ref 70–99)
Potassium: 3.9 mmol/L (ref 3.5–5.1)
Sodium: 136 mmol/L (ref 135–145)
Total Bilirubin: 0.6 mg/dL (ref 0.0–1.2)
Total Protein: 7 g/dL (ref 6.5–8.1)

## 2023-03-31 LAB — MAGNESIUM: Magnesium: 1.4 mg/dL — ABNORMAL LOW (ref 1.7–2.4)

## 2023-03-31 MED ORDER — SODIUM CHLORIDE 0.9% FLUSH
10.0000 mL | Freq: Once | INTRAVENOUS | Status: AC
  Administered 2023-03-31: 10 mL via INTRAVENOUS

## 2023-03-31 MED ORDER — HEPARIN SOD (PORK) LOCK FLUSH 100 UNIT/ML IV SOLN
500.0000 [IU] | Freq: Once | INTRAVENOUS | Status: AC | PRN
  Administered 2023-03-31: 500 [IU]

## 2023-03-31 MED ORDER — DIPHENHYDRAMINE HCL 50 MG/ML IJ SOLN
50.0000 mg | Freq: Once | INTRAMUSCULAR | Status: AC
  Administered 2023-03-31: 50 mg via INTRAVENOUS
  Filled 2023-03-31: qty 1

## 2023-03-31 MED ORDER — SODIUM CHLORIDE 0.9 % IV SOLN: Freq: Once | INTRAVENOUS | Status: AC

## 2023-03-31 MED ORDER — SODIUM CHLORIDE 0.9% FLUSH
10.0000 mL | INTRAVENOUS | Status: DC | PRN
  Administered 2023-03-31: 10 mL

## 2023-03-31 MED ORDER — CETUXIMAB CHEMO IV INJECTION 200 MG/100ML
500.0000 mg/m2 | Freq: Once | INTRAVENOUS | Status: AC
  Administered 2023-03-31: 900 mg via INTRAVENOUS
  Filled 2023-03-31: qty 450

## 2023-03-31 MED ORDER — METHYLPREDNISOLONE SODIUM SUCC 125 MG IJ SOLR
125.0000 mg | Freq: Once | INTRAMUSCULAR | Status: AC
  Administered 2023-03-31: 125 mg via INTRAVENOUS
  Filled 2023-03-31: qty 2

## 2023-03-31 MED ORDER — MAGNESIUM SULFATE 4 GM/100ML IV SOLN
4.0000 g | Freq: Once | INTRAVENOUS | Status: AC
  Administered 2023-03-31: 4 g via INTRAVENOUS
  Filled 2023-03-31: qty 100

## 2023-03-31 MED ORDER — HYDROCODONE-ACETAMINOPHEN 7.5-325 MG/15ML PO SOLN: 15.0000 mL | Freq: Four times a day (QID) | ORAL | 0 refills | Status: DC | PRN

## 2023-03-31 MED ORDER — FAMOTIDINE IN NACL 20-0.9 MG/50ML-% IV SOLN
20.0000 mg | Freq: Once | INTRAVENOUS | Status: AC
  Administered 2023-03-31: 20 mg via INTRAVENOUS
  Filled 2023-03-31: qty 50

## 2023-03-31 NOTE — Patient Instructions (Signed)
 [facility] [place] - A DEPT OF [facility]  Discharge Instructions: Thank you for choosing [facility] to provide your oncology and hematology care.  If you have a lab appointment with the Cancer Center - please note that after April 8th, 2024, all labs will be drawn in the cancer center.  You do not have to check in or register with the main entrance as you have in the past but will complete your check-in in the cancer center.  Wear comfortable clothing and clothing appropriate for easy access to any Portacath or PICC line.   We strive to give you quality time with your provider. You may need to reschedule your appointment if you arrive late (15 or more minutes).  Arriving late affects you and other patients whose appointments are after yours.  Also, if you miss three or more appointments without notifying the office, you may be dismissed from the clinic at the provider's discretion.      For prescription refill requests, have your pharmacy contact our office and allow 72 hours for refills to be completed.    Today you received the following chemotherapy and/or immunotherapy agents Erbitux   To help prevent nausea and vomiting after your treatment, we encourage you to take your nausea medication as directed.  Cetuximab Injection What is this medication? CETUXIMAB (se TUX i mab) treats head and neck cancer. It may also be used to treat colorectal cancer. It works by blocking a protein that causes cancer cells to grow and multiply. This helps to slow or stop the spread of cancer cells. It is a monoclonal antibody. This medicine may be used for other purposes; ask your health care provider or pharmacist if you have questions. COMMON BRAND NAME(S): Erbitux What should I tell my care team before I take this medication? They need to know if you have any of these conditions: Heart disease History of tick bites Low levels of calcium, magnesium, or potassium in  the blood Lung disease Red meat allergy An unusual or allergic reaction to cetuximab, other medications, foods, dyes, or preservatives Pregnant or trying to get pregnant Breast-feeding How should I use this medication? This medication is infused into a vein. It is given by your care team in a hospital or clinic setting. Talk to your care team about the use of this medication in children. Special care may be needed. Overdosage: If you think you have taken too much of this medicine contact a poison control center or emergency room at once. NOTE: This medicine is only for you. Do not share this medicine with others. What if I miss a dose? Keep appointments for follow-up doses. It is important not to miss your dose. Call your care team if you are unable to keep an appointment. What may interact with this medication? Interactions are not expected. This list may not describe all possible interactions. Give your health care provider a list of all the medicines, herbs, non-prescription drugs, or dietary supplements you use. Also tell them if you smoke, drink alcohol, or use illegal drugs. Some items may interact with your medicine. What should I watch for while using this medication? Visit your care team for regular checks on your progress. This medication may make you feel generally unwell. This is not uncommon, as chemotherapy can affect healthy cells as well as cancer cells. Report any side effects. Continue your course of treatment even though you feel ill unless your care team tells you  to stop. You may need blood work done while you are taking this medication. This medication can make you more sensitive to the sun. Keep out of the sun while taking this medication and for 2 months after the last dose. If you cannot avoid being in the sun, wear protective clothing and sunscreen. Do not use sun lamps, tanning beds, or tanning booths. This medication can cause serious infusion reactions. To reduce the  risk, your care team may give you other medications to take before receiving this one. Be sure to follow the directions of your care team. This medication may cause serious skin reactions. They can happen weeks to months after starting the medication. Contact your care team right away if you notice fevers or flu-like symptoms with a rash. The rash may be red or purple and then turn into blisters or peeling of the skin. You may also notice a red rash with swelling of the face, lips, or lymph nodes in your neck or under your arms. Talk to your care team if you may be pregnant. Serious birth defects can occur if you take this medication during pregnancy and for 2 months after the last dose. You will need a negative pregnancy test before starting this medication. Contraception is recommended while taking this medication and for 2 months after the last dose. Your care team can help you find the option that works for you. Do not breastfeed while taking this medication and for 2 months after the last dose. This medication may cause infertility. Talk to your care team if you are concerned about your fertility. What side effects may I notice from receiving this medication? Side effects that you should report to your care team as soon as possible: Allergic reactions--skin rash, itching, hives, swelling of the face, lips, tongue, or throat Dry cough, shortness of breath or trouble breathing Heart attack--pain or tightness in the chest, shoulders, arms, or jaw, nausea, shortness of breath, cold or clammy skin, feeling faint or lightheaded Infusion reactions--chest pain, shortness of breath or trouble breathing, feeling faint or lightheaded Low calcium level--muscle pain or cramps, confusion, tingling, or numbness in the hands or feet Low magnesium level--muscle pain or cramps, unusual weakness or fatigue, fast or irregular heartbeat, tremors Low potassium level--muscle pain or cramps, unusual weakness or fatigue,  fast or irregular heartbeat, constipation Redness, blistering, peeling, or loosening of the skin, including inside the mouth Side effects that usually do not require medical attention (report to your care team if they continue or are bothersome): Diarrhea Headache Joint pain Nausea Unusual weakness or fatigue Weight loss This list may not describe all possible side effects. Call your doctor for medical advice about side effects. You may report side effects to FDA at 1-800-FDA-1088. Where should I keep my medication? This medication is given in a hospital or clinic. It will not be stored at home. NOTE: This sheet is a summary. It may not cover all possible information. If you have questions about this medicine, talk to your doctor, pharmacist, or health care provider.  2024 Elsevier/Gold Standard (2021-05-16 00:00:00)   BELOW ARE SYMPTOMS THAT SHOULD BE REPORTED IMMEDIATELY: *FEVER GREATER THAN 100.4 F (38 C) OR HIGHER *CHILLS OR SWEATING *NAUSEA AND VOMITING THAT IS NOT CONTROLLED WITH YOUR NAUSEA MEDICATION *UNUSUAL SHORTNESS OF BREATH *UNUSUAL BRUISING OR BLEEDING *URINARY PROBLEMS (pain or burning when urinating, or frequent urination) *BOWEL PROBLEMS (unusual diarrhea, constipation, pain near the anus) TENDERNESS IN MOUTH AND THROAT WITH OR WITHOUT PRESENCE OF ULCERS (sore  throat, sores in mouth, or a toothache) UNUSUAL RASH, SWELLING OR PAIN  UNUSUAL VAGINAL DISCHARGE OR ITCHING   Items with * indicate a potential emergency and should be followed up as soon as possible or go to the Emergency Department if any problems should occur.  Please show the CHEMOTHERAPY ALERT CARD or IMMUNOTHERAPY ALERT CARD at check-in to the Emergency Department and triage nurse.  Should you have questions after your visit or need to cancel or reschedule your appointment, please contact [facility] [place] - A DEPT OF [facility] [phone]  and follow the prompts.  Office  hours are 8:00 a.m. to 4:30 p.m. Monday - Friday. Please note that voicemails left after 4:00 p.m. may not be returned until the following business day.  We are closed weekends and major holidays. You have access to a nurse at all times for urgent questions. Please call the main number to the clinic [phone] and follow the prompts.  For any non-urgent questions, you may also contact your provider using MyChart. We now offer e-Visits for anyone 13 and older to request care online for non-urgent symptoms. For details visit [email or web address].   Also download the MyChart app! Go to the app store, search "MyChart", open the app, select [place], and log in with your MyChart username and password.

## 2023-03-31 NOTE — Progress Notes (Signed)
 Patient presents today for Erbitux infusion.  Patient is in satisfactory condition with no new complaints voiced.  Vital signs are stable.  Labs reviewed and all labs are within treatment parameters. Patient will receive 4g IV magnesium sulfate per Dr.Katragadda's standing orders.We will proceed with treatment per MD orders.    Treatment given today per MD orders. Tolerated infusion without adverse affects. Vital signs stable. No complaints at this time. Discharged from clinic via wheelchair in stable condition. Alert and oriented x 3. F/U with [facility] as scheduled.

## 2023-04-06 ENCOUNTER — Other Ambulatory Visit: Payer: Self-pay | Admitting: Hematology

## 2023-04-06 NOTE — Telephone Encounter (Signed)
 Tried to contact him to see what reason was for refill request.  Is this something we give to him periodically?

## 2023-04-08 ENCOUNTER — Ambulatory Visit (HOSPITAL_COMMUNITY)
Admission: RE | Admit: 2023-04-08 | Discharge: 2023-04-08 | Disposition: A | Discharge status: Home / Self Care | Source: Ambulatory Visit | Attending: Hematology | Admitting: Hematology

## 2023-04-08 ENCOUNTER — Other Ambulatory Visit: Payer: Self-pay

## 2023-04-08 DIAGNOSIS — C049 Malignant neoplasm of floor of mouth, unspecified: Secondary | ICD-10-CM | POA: Diagnosis not present

## 2023-04-08 MED ORDER — FLUDEOXYGLUCOSE F - 18 (FDG) INJECTION
6.9200 | Freq: Once | INTRAVENOUS | Status: AC | PRN
  Administered 2023-04-08: 6.92 via INTRAVENOUS

## 2023-04-13 DIAGNOSIS — K1379 Other lesions of oral mucosa: Secondary | ICD-10-CM | POA: Diagnosis not present

## 2023-04-13 DIAGNOSIS — I10 Essential (primary) hypertension: Secondary | ICD-10-CM | POA: Diagnosis not present

## 2023-04-13 DIAGNOSIS — E782 Mixed hyperlipidemia: Secondary | ICD-10-CM | POA: Diagnosis not present

## 2023-04-13 DIAGNOSIS — D539 Nutritional anemia, unspecified: Secondary | ICD-10-CM | POA: Diagnosis not present

## 2023-04-13 DIAGNOSIS — R252 Cramp and spasm: Secondary | ICD-10-CM | POA: Diagnosis not present

## 2023-04-13 DIAGNOSIS — R7301 Impaired fasting glucose: Secondary | ICD-10-CM | POA: Diagnosis not present

## 2023-04-13 DIAGNOSIS — G629 Polyneuropathy, unspecified: Secondary | ICD-10-CM | POA: Diagnosis not present

## 2023-04-13 DIAGNOSIS — E559 Vitamin D deficiency, unspecified: Secondary | ICD-10-CM | POA: Diagnosis not present

## 2023-04-13 DIAGNOSIS — R809 Proteinuria, unspecified: Secondary | ICD-10-CM | POA: Diagnosis not present

## 2023-04-13 DIAGNOSIS — C069 Malignant neoplasm of mouth, unspecified: Secondary | ICD-10-CM | POA: Diagnosis not present

## 2023-04-13 NOTE — Progress Notes (Signed)
 [facility] [address] [place] [address]    Clinic Day:  04/14/2023  Referring physician: [name], MD  Patient Care Team: [name], MD as PCP - General (Internal Medicine) [name], MD as Consulting Physician (Urology) [name], MD as Medical Oncologist (Medical Oncology) [name], RN as Oncology Nurse Navigator (Medical Oncology)   ASSESSMENT & PLAN:   Assessment: 1.  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  Enlarged contralateral right submandibular node.  Enlargement of the left submandibular gland probably reactive.  No abscess. - Biopsy (07/18/2021) floor of the mouth: Invasive well to moderately differentiated keratinizing squamous cell carcinoma.  Tumor cells negative for p16. - 10/14/2021: Floor of the mouth resection, tracheostomy, bilateral selective neck dissections zones 1-3 by Dr. [name] - Pathology: Invasive moderately differentiated keratinizing SCC, 4.1 cm, carcinoma invades for a depth of about 1.9 cm and involves saliva gland tissue.  Anterior, posterior, right, left resection margins are involved.  Deep resection margin is negative.  Metastatic carcinoma 1/3 level 1 lymph nodes.  3 level 2 and 3 level 3 lymph nodes negative for carcinoma.  0/10 lymph nodes involved in the left side neck zone 1, 2, 3 dissection.  ENE not identified.  LVI/perineural invasion not identified. - I have talked to pathologist.  Reexcision margins were considered negative.  He has a high risk feature which is T4 tumor. -  PET scan on 12/12/2021: Soft tissue thickening and calcification along the ventral aspect of the trachea with associated hypermetabolism corresponding to recent tracheostomy site.  7 mm right  paratracheal lymph node new with SUV 2.4.  Hypermetabolism along the dome of the right hepatic lobe with probable 2.4 cm low-attenuation lesion - Liver lesion biopsy (01/28/2022): Metastatic squamous cell carcinoma, keratinizing. - NGS testing: PD-L1 (22 C3): CPS: 100, CD274 (PD-L1) amplified, JAK2 amplified, PIK3CA pathogenic variant exon 10, T p53 pathogenic variant, MS-stable, TMB-low - Per Dr. [name], he also has local recurrence of disease in the floor of the mouth. - Keytruda from 03/10/2022 through 08/24/2022 with progression - XRT to the floor of the mouth on 06/29/2022 - PET scan 09/10/2022: Liver lesion increased in size measures 3.1 x 2.9 cm, previously 2.6 x 1.7 cm.  Right cervical lymph node meta stasis, mildly progressive. - HER2 by IHC 0 on biopsy from 01/28/2022. - Weekly cetuximab started on 10/07/2022, discontinued on 04/14/2023 due to progression - PET scan (12/24/2022): Hypermetabolic anterior floor of mouth mass stable.  Stable right level 1B nodal metastasis.  Hypermetabolic 4.6 cm right liver dome metastasis increased in size by 8 mm.  No new sites of hypermetabolic metastatic disease. - MRI liver (01/07/2023): Right hepatic liver lesion measures 4.8 x 3.3 cm.  No new hepatic lesions seen. - Weekly docetaxel started on 04/14/2023   2.  Social/family history: - He lives in his apartment by himself and is independent of ADLs and IADLs.  He does not drive.  He worked as a Curator and several other jobs.  Quit smoking 1 month ago.  He smoked 1 pack/week for more than 50 years. - 1 brother had agent orange related cancer.  Another brother also had cancer, type unknown to the patient.   3.  Stage Ib pure seminoma: - Status post  right radical orchiectomy on 12/20/2012. - He was on close surveillance rather than adjuvant chemotherapy.    Plan: 1.  Stage IV (PT4PN1 M1) SCC of the floor of the mouth, p16 negative: - We reviewed PET scan images with the patient and his sister.  It showed mild  progression of the floor of the mouth cancer as well as right neck nodal metastatic disease.  Some progression of the liver disease. - I have discussed prognosis and further options in detail.  We discussed best supportive care in the form of hospice.  Patient and her sister are not ready for hospice yet.  He is seeking active treatment.  I discussed single agent chemotherapy with weekly docetaxel.  We discussed side effects in detail. - Will start him at docetaxel 25 mg/m and titrate up if he tolerates well.   2.  Oral pain: - Continue liquid hydrocodone twice daily as needed.  Pain is well-controlled.  He lost about 8 pounds mostly from progression of his cancer.   3.  Cetuximab skin rash: - He will continue doxycycline 100 mg twice daily for now another few weeks and stop it.   4.  Hypotension: - Continue to hold antihypertensives as his blood pressure is 113/62.   5.  Hypomagnesemia: - Continue magnesium 2 tablets twice daily.  Magnesium is 1.4 today.  He will receive IV magnesium 4 g.    Orders Placed This Encounter  Procedures   Magnesium    Standing Status:   Future    Expected Date:   04/15/2023    Expiration Date:   04/14/2024   CBC with Differential    Standing Status:   Future    Expected Date:   04/15/2023    Expiration Date:   04/14/2024   Comprehensive metabolic panel    Standing Status:   Future    Expected Date:   04/15/2023    Expiration Date:   04/14/2024   Magnesium    Standing Status:   Future    Expected Date:   04/22/2023    Expiration Date:   04/21/2024   CBC with Differential    Standing Status:   Future    Expected Date:   04/22/2023    Expiration Date:   04/21/2024   Comprehensive metabolic panel    Standing Status:   Future    Expected Date:   04/22/2023    Expiration Date:   04/21/2024   Magnesium    Standing Status:   Future    Expected Date:   04/29/2023    Expiration Date:   04/28/2024   CBC with Differential    Standing Status:   Future    Expected Date:    04/29/2023    Expiration Date:   04/28/2024   Comprehensive metabolic panel    Standing Status:   Future    Expected Date:   04/29/2023    Expiration Date:   04/28/2024   Magnesium    Standing Status:   Future    Expected Date:   05/06/2023    Expiration Date:   05/05/2024   CBC with Differential    Standing Status:   Future    Expected Date:   05/06/2023    Expiration Date:   05/05/2024   Comprehensive metabolic panel    Standing Status:   Future    Expected Date:   05/06/2023    Expiration Date:   05/05/2024   Magnesium    Standing Status:   Future  Expected Date:   05/13/2023    Expiration Date:   05/12/2024   CBC with Differential    Standing Status:   Future    Expected Date:   05/13/2023    Expiration Date:   05/12/2024   Comprehensive metabolic panel    Standing Status:   Future    Expected Date:   05/13/2023    Expiration Date:   05/12/2024   Magnesium    Standing Status:   Future    Expected Date:   05/20/2023    Expiration Date:   05/19/2024   CBC with Differential    Standing Status:   Future    Expected Date:   05/20/2023    Expiration Date:   05/19/2024   Comprehensive metabolic panel    Standing Status:   Future    Expected Date:   05/20/2023    Expiration Date:   05/19/2024      I,[name],acting as a scribe for [name], MD.,have documented all relevant documentation on the behalf of [name], MD,as directed by  [name], MD while in the presence of [name], MD.   I, [name] MD, have reviewed the above documentation for accuracy and completeness, and I agree with the above.   [name], MD   4/2/202511:38 AM  CHIEF COMPLAINT:   Diagnosis: squamous cell carcinoma of the floor of the mouth, history of testicular seminoma    Cancer Staging  Cancer of floor of mouth [facility]) Staging form: Oral Cavity, AJCC 8th Edition - Clinical stage from 11/25/2021: Stage IVC (cT4a, cN1, pM1) - Signed by [name], MD on 02/04/2022  Testicle cancer, right Staging form: Testis, AJCC 7th Edition - Clinical: Stage I (T2, N0, M0) - Signed by [name], PA-C on 07/23/2013    Prior Therapy: 1. Pembrolizumab, 03/10/22 - 08/24/22 2. XRT to floor of mouth, 06/17/22 - 08/19/22 3. SBRT to liver lesion, 03/15/23 - 03/26/23  Current Therapy:  Cetuximab weekly    HISTORY OF PRESENT ILLNESS:   Oncology History  Testicle cancer, right  12/20/2012 Surgery   Right total orchiectomyt- 1. Testis, biopsy, right - SEMINOMA. PLEASE SEE COMMENT. 2. Testis, tumor, right - SEMINOMA, 7.5 CM. - ANGIOLYMPHATIC INVASION PRESENT. - RESECTION MARGINS, NEGATIVE FOR ATYPIA OR MALIGNANCY.   01/18/2013 PET scan   Status post right orchiectomy. No findings specific for metastatic disease. Small para-aortic nodes measuring up to 5 mm short axis, without convincing hypermetabolism. Given location, attention on follow-up is suggested.   04/17/2013 Imaging   CT CAP- No evidence of metastatic disease in the chest, abdomen or pelvis. Proximal LAD coronary artery calcification.   07/20/2013 Imaging   CT abd/pelvis- No evidence of metastatic disease in the abdomen or pelvis.   10/24/2013 Imaging   CT abd/pelvis- No findings to suggest metastatic disease in the abdomen or pelvis   10/24/2013 Imaging   Chest xray- Probable COPD.  Negative for metastatic disease.   02/21/2014 Imaging   CT abd/pelvis- Stable abdominal pelvic CT status post right orchectomy. No evidence of adenopathy or other metastatic disease.   02/21/2014 Imaging   Chest xray- Left lower lobe mild atelectasis and/or infiltrate.     09/10/2014 Imaging   CT abd/pelvis- Status post right orchiectomy.   No evidence of metastatic disease.   3 mm nonobstructing right upper pole renal calculus. No hydronephrosis.   03/14/2015 Imaging   CT abd/pelvis- Stable exam. No evidence of metastatic disease or other acute findings within the abdomen  or pelvis.   09/13/2015 Imaging    CT abd/pelvis- No acute findings and no evidence for mass or adenopathy.    04/24/2016 Imaging   CT abd/pelvis: IMPRESSION: 1. Stable exam. No new or progressive findings. No features to suggest metastatic disease.   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HCC)  10/14/2021 Initial Diagnosis   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HCC)   11/25/2021 Cancer Staging   Staging form: Oral Cavity, AJCC 8th Edition - Clinical stage from 11/25/2021: Stage IVC (cT4a, cN1, pM1) - Signed by [name], MD on 02/04/2022 Histopathologic type: Squamous cell carcinoma, NOS Stage prefix: Initial diagnosis Histologic grade (G): G2 Histologic grading system: 3 grade system   03/10/2022 - 08/24/2022 Chemotherapy   Patient is on Treatment Plan : HEAD/NECK Pembrolizumab (200) q21d     10/07/2022 - 03/31/2023 Chemotherapy   Patient is on Treatment Plan : HEAD/NECK Cetuximab q7d     04/14/2023 -  Chemotherapy   Patient is on Treatment Plan : Head and Neck Docetaxel (35) q7d        INTERVAL HISTORY:   Nathaniel Hicks is a 83 y.o. male presenting to clinic today for follow up of squamous cell carcinoma of the floor of the mouth. He was last seen by me on 03/17/23.  Since his last visit, he underwent restaging PET scan on 04/08/23 showing: mild progression of mouth primary and right-sided cervical nodal metastasis; moderate progression of isolated hepatic metastasis; similar subtle activity within posterior right psoas muscle with possible correlate soft tissue fullness.  Today, he states that he is doing well overall. His appetite level is at 50%. His energy level is at 50%.  PAST MEDICAL HISTORY:   Past Medical History: Past Medical History:  Diagnosis Date   Arthritis    BPH (benign prostatic hyperplasia)    Difficult intubation    HOH (hard of hearing)    Hyperlipidemia 11/02/2017   Hypertension    Hypertension 11/02/2017   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HCC)    2014   Vitamin D deficiency 11/02/2017    Surgical History: Past  Surgical History:  Procedure Laterality Date   COLONOSCOPY N/A 11/24/2012   Procedure: COLONOSCOPY;  Surgeon: Malissa Hippo, MD;  Location: [facility];  Service: Endoscopy;  Laterality: N/A;  830-moved to 730 Ann notified pt   FLOOR OF MOUTH BIOPSY N/A 10/14/2021   Procedure: FLOOR OF MOUTH RESECTION;  Surgeon: Christia Reading, MD;  Location: [facility] OR;  Service: ENT;  Laterality: N/A;   HEMORROIDECTOMY     KNEE ARTHROSCOPY WITH LATERAL MENISECTOMY Right 08/31/2017   Procedure: KNEE ARTHROSCOPY WITH LATERAL MENISECTOMY;  Surgeon: Vickki Hearing, MD;  Location: [facility] ORS;  Service: Orthopedics;  Laterality: Right;   LESION EXCISION N/A 03/23/2012   Procedure: EXCISION NEOPLASM SCALP ;  Surgeon: Dalia Heading, MD;  Location: [facility] ORS;  Service: General;  Laterality: N/A;  Excision of Scalp Neoplasm   ORCHIECTOMY Right 12/20/2012   Procedure: RIGHT RADICAL ORCHIECTOMY/POSSIBLE BX RIGHT TESTICLE;  Surgeon: Ky Barban, MD;  Location: [facility] ORS;  Service: Urology;  Laterality: Right;   PORTACATH PLACEMENT Left 03/25/2022   Procedure: INSERTION PORT-A-CATH;  Surgeon: Franky Macho, MD;  Location: [facility] OR[facility];  Service: General;  Laterality: Left;   PROSTATE SURGERY     RADICAL NECK DISSECTION Bilateral 10/14/2021   Procedure: NECK DISSECTION;  Surgeon: Christia Reading, MD;  Location: [facility] OR;  Service: ENT;  Laterality: Bilateral;   SCALP LACERATION REPAIR     [facility]-Dr Katrinka Blazing   SKIN FULL  THICKNESS GRAFT Bilateral 10/14/2021   Procedure: PLATYSMA FLAP CLOSURE;  Surgeon: Christia Reading, MD;  Location: [facility] OR;  Service: ENT;  Laterality: Bilateral;   TOOTH EXTRACTION  10/14/2021   Procedure: DENTAL EXTRACTIONS;  Surgeon: Christia Reading, MD;  Location: [facility] OR;  Service: ENT;;   TRACHEOSTOMY TUBE PLACEMENT N/A 10/14/2021   Procedure: TRACHEOSTOMY;  Surgeon: Christia Reading, MD;  Location: [facility] OR;  Service: ENT;  Laterality: N/A;    Social History: Social History   Socioeconomic History   Marital status: Single     Spouse name: Not on file   Number of children: Not on file   Years of education: Not on file   Highest education level: Not on file  Occupational History   Not on file  Tobacco Use   Smoking status: Former    Current packs/day: 0.25    Average packs/day: 0.3 packs/day for 50.0 years (12.5 ttl pk-yrs)    Types: Cigarettes   Smokeless tobacco: Never  Vaping Use   Vaping status: Never Used  Substance and Sexual Activity   Alcohol use: Not Currently   Drug use: No   Sexual activity: Yes    Birth control/protection: None  Other Topics Concern   Not on file  Social History Narrative   Not on file   Social Drivers of Health   Financial Resource Strain: Not on file  Food Insecurity: Not on file  Transportation Needs: Not on file  Physical Activity: Not on file  Stress: Not on file  Social Connections: Not on file  Intimate Partner Violence: Not on file    Family History: Family History  Problem Relation Age of Onset   Cancer Brother    Cancer Brother     Current Medications:  Current Outpatient Medications:    chlorhexidine (PERIDEX) 0.12 % solution, Use as directed 5 mLs in the mouth or throat 2 (two) times daily., Disp: , Rfl:    acetaminophen (TYLENOL) 650 MG CR tablet, Take 650 mg by mouth every 8 (eight) hours as needed for pain., Disp: , Rfl:    amLODipine-olmesartan (AZOR) 5-20 MG tablet, TAKE 1 TABLET BY MOUTH DAILY, Disp: 90 tablet, Rfl: 0   budesonide-formoterol (SYMBICORT) 80-4.5 MCG/ACT inhaler, Inhale 2 puffs into the lungs in the morning and at bedtime., Disp: 1 each, Rfl: 12   Cholecalciferol (VITAMIN D3) 50 MCG (2000 UT) TABS, Take 2,000 Units by mouth daily., Disp: , Rfl:    ciprofloxacin-dexamethasone (CIPRODEX) OTIC suspension, Place 4 drops into the left ear 2 (two) times daily., Disp: , Rfl:    clindamycin (CLINDAGEL) 1 % gel, Apply topically 2 (two) times daily. Apply twice daily to chest rash, Disp: 30 g, Rfl: 0   doxycycline (VIBRA-TABS) 100 MG  tablet, TAKE 1 TABLET(100 MG) BY MOUTH TWICE DAILY, Disp: 60 tablet, Rfl: 3   HYDROcodone-acetaminophen (HYCET) 7.5-325 mg/15 ml solution, Take 15 mLs by mouth every 6 (six) hours as needed for moderate pain (pain score 4-6)., Disp: 473 mL, Rfl: 0   lidocaine (XYLOCAINE) 2 % solution, Take by mouth., Disp: , Rfl:    magnesium oxide (MAG-OX) 400 MG tablet, Take 2 tablets (800 mg total) by mouth 2 (two) times daily., Disp: 120 tablet, Rfl: 3   Menthol, Topical Analgesic, (BIOFREEZE EX), Apply 1 application  topically daily as needed (pain)., Disp: , Rfl:    naloxone (NARCAN) nasal spray 4 mg/0.1 mL, SMARTSIG:Both Nares, Disp: , Rfl:    Polyethyl Glycol-Propyl Glycol (GOODSENSE LUBRICANT EYE DROPS OP), Place 1 drop  into both eyes daily as needed (dry eyes)., Disp: , Rfl:    pravastatin (PRAVACHOL) 80 MG tablet, Take 80 mg by mouth daily., Disp: , Rfl:    predniSONE (DELTASONE) 10 MG tablet, Take  4 each am x 2 days,   2 each am x 2 days,  1 each am x 2 days and stop, Disp: 14 tablet, Rfl: 0   RAPAFLO 8 MG CAPS capsule, Take 8 mg by mouth daily. Silodosin, Disp: , Rfl:    vitamin B-12 (CYANOCOBALAMIN) 500 MCG tablet, Take 500 mcg by mouth daily., Disp: , Rfl:  No current facility-administered medications for this visit.  Facility-Administered Medications Ordered in Other Visits:    0.9 %  sodium chloride infusion, , Intravenous, Continuous, [name], MD, Last Rate: 10 mL/hr at 04/14/23 1135, New Bag at 04/14/23 1135   dexamethasone (DECADRON) injection 10 mg, 10 mg, Intravenous, Once, [name], MD   DOCEtaxel (TAXOTERE) 46 mg in sodium chloride 0.9 % 150 mL chemo infusion, 25 mg/m2 (Treatment Plan Recorded), Intravenous, Once, [name], MD   heparin lock flush 100 unit/mL, 500 Units, Intracatheter, Once PRN, [name], MD   magnesium sulfate IVPB 4 g 100 mL, 4 g, Intravenous, Once, [name], MD   ondansetron [facility]) injection 8 mg, 8 mg,  Intravenous, Once, [name], MD   sodium chloride flush (NS) 0.9 % injection 10 mL, 10 mL, Intracatheter, PRN, [name], MD   Allergies: No Known Allergies  REVIEW OF SYSTEMS:   Review of Systems  Constitutional:  Positive for fatigue. Negative for chills and fever.  HENT:   Positive for mouth sores. Negative for lump/mass, nosebleeds, sore throat and trouble swallowing.   Eyes:  Negative for eye problems.  Respiratory:  Negative for cough and shortness of breath.   Cardiovascular:  Negative for chest pain, leg swelling and palpitations.  Gastrointestinal:  Negative for abdominal pain, constipation, diarrhea, nausea and vomiting.  Genitourinary:  Negative for bladder incontinence, difficulty urinating, dysuria, frequency, hematuria and nocturia.   Musculoskeletal:  Negative for arthralgias, back pain, flank pain, myalgias and neck pain.  Skin:  Negative for itching and rash.  Neurological:  Negative for dizziness, headaches and numbness.  Hematological:  Does not bruise/bleed easily.  Psychiatric/Behavioral:  Negative for depression, sleep disturbance and suicidal ideas. The patient is not nervous/anxious.   All other systems reviewed and are negative.    VITALS:   Height 5\' 11"  (1.803 m), weight 146 lb 13.2 oz (66.6 kg).  Wt Readings from Last 3 Encounters:  04/14/23 146 lb 13.2 oz (66.6 kg)  03/31/23 153 lb 6.4 oz (69.6 kg)  03/17/23 154 lb (69.9 kg)    Body mass index is 20.48 kg/m.  Performance status (ECOG): 1 - Symptomatic but completely ambulatory  PHYSICAL EXAM:   Physical Exam Vitals and nursing note reviewed. Exam conducted with a chaperone present.  Constitutional:      Appearance: Normal appearance.  Cardiovascular:     Rate and Rhythm: Normal rate and regular rhythm.     Pulses: Normal pulses.     Heart sounds: Normal heart sounds.  Pulmonary:     Effort: Pulmonary effort is normal.     Breath sounds: Normal breath sounds.   Abdominal:     Palpations: Abdomen is soft. There is no hepatomegaly, splenomegaly or mass.     Tenderness: There is no abdominal tenderness.  Musculoskeletal:     Right lower leg: No edema.     Left lower leg: No  edema.  Lymphadenopathy:     Cervical: No cervical adenopathy.     Right cervical: No superficial, deep or posterior cervical adenopathy.    Left cervical: No superficial, deep or posterior cervical adenopathy.     Upper Body:     Right upper body: No supraclavicular or axillary adenopathy.     Left upper body: No supraclavicular or axillary adenopathy.  Neurological:     General: No focal deficit present.     Mental Status: He is alert and oriented to person, place, and time.  Psychiatric:        Mood and Affect: Mood normal.        Behavior: Behavior normal.     LABS:   CBC     Component Value Date/Time   WBC 6.1 04/14/2023 0947   RBC 3.16 (L) 04/14/2023 0947   HGB 10.2 (L) 04/14/2023 0947   HCT 32.2 (L) 04/14/2023 0947   PLT 213 04/14/2023 0947   MCV 101.9 (H) 04/14/2023 0947   MCH 32.3 04/14/2023 0947   MCHC 31.7 04/14/2023 0947   RDW 13.3 04/14/2023 0947   LYMPHSABS 0.6 (L) 04/14/2023 0947   MONOABS 0.7 04/14/2023 0947   EOSABS 0.1 04/14/2023 0947   BASOSABS 0.1 04/14/2023 0947    CMP      Component Value Date/Time   NA 135 04/14/2023 0947   K 4.1 04/14/2023 0947   CL 100 04/14/2023 0947   CO2 25 04/14/2023 0947   GLUCOSE 115 (H) 04/14/2023 0947   BUN 11 04/14/2023 0947   CREATININE 0.71 04/14/2023 0947   CALCIUM 10.1 04/14/2023 0947   PROT 6.8 04/14/2023 0947   ALBUMIN 2.6 (L) 04/14/2023 0947   AST 24 04/14/2023 0947   ALT 15 04/14/2023 0947   ALKPHOS 47 04/14/2023 0947   BILITOT 0.5 04/14/2023 0947   GFRNONAA >60 04/14/2023 0947   GFRAA >60 10/26/2018 1137     Lab Results  Component Value Date   CEA 2.2 01/19/2013   /  CEA  Date Value Ref Range Status  01/19/2013 2.2 0.0 - 5.0 ng/mL Final    Comment:    Performed at [facility]   No results found for: "PSA1" No results found for: "CAN199" No results found for: "CAN125"  No results found for: "TOTALPROTELP", "ALBUMINELP", "A1GS", "A2GS", "BETS", "BETA2SER", "GAMS", "MSPIKE", "SPEI" Lab Results  Component Value Date   TIBC 244 (L) 08/04/2022   TIBC 251 05/12/2022   FERRITIN 273 08/04/2022   FERRITIN 195 05/12/2022   IRONPCTSAT 19 08/04/2022   IRONPCTSAT 18 05/12/2022   Lab Results  Component Value Date   LDH 172 11/12/2020   LDH 175 10/26/2018   LDH 179 10/25/2013     STUDIES:   NM PET Image Restag (PS) Skull Base To Thigh Result Date: 04/13/2023 CLINICAL DATA:  Subsequent treatment strategy for cancer of floor of mouth. Restaging. Radiation last week. EXAM: NUCLEAR MEDICINE PET SKULL BASE TO THIGH TECHNIQUE: 6.9 mCi F-18 FDG was injected intravenously. Full-ring PET imaging was performed from the skull base to thigh after the radiotracer. CT data was obtained and used for attenuation correction and anatomic localization. Fasting blood glucose: 132 mg/dl COMPARISON:  95/62/1308 FINDINGS: Mediastinal blood pool activity: SUV max 2.3 Liver activity: SUV max NA NECK: Right-sided floor mild mass measures 4.2 x 4.0 cm and a S.U.V. max of 8.8 on 43/202. Compare 4.2 x 2.8 cm and a S.U.V. max of 10.4. Persistent anterior mandibular osseous destruction. Right-sided level 1 B node  measures 2.3 cm and a S.U.V. max of 7.8 on 48/202 versus 1.1 cm and a S.U.V. max of 7.5 on the prior. Right-sided level 4 node measures 2.4 cm and a S.U.V. max of 10.3 today versus 1.3 cm and a S.U.V. max of 9.4 on the prior. Incidental CT findings: Bilateral carotid atherosclerosis. Cerebral atrophy. CHEST: No pulmonary parenchymal or thoracic nodal hypermetabolism. Incidental CT findings: Left Port-A-Cath tip low SVC. Mild cardiomegaly. Aortic and coronary artery calcification. Centrilobular emphysema. Bibasilar scarring. ABDOMEN/PELVIS: High right hepatic lobe mass measures 8.3 x 5.5  cm and a S.U.V. max of 9.3 on 98/202 versus 4.6 by 3.3 cm and a S.U.V. max of 8.9. No abdominopelvic nodal hypermetabolism. Incidental CT findings: Normal adrenal glands. Lower pole right renal punctate collecting system calculus. Subcentimeter interpolar left renal cyst . In the absence of clinically indicated signs/symptoms require(s) no independent follow-up. Moderate prostatomegaly. SKELETON: No abnormal marrow hypermetabolism. Subtle focus of hypermetabolism within the right psoas posteriorly may correspond to subtle soft tissue fullness on 138/202. This measures a S.U.V. max of 1.9 today versus a S.U.V. max of 1.4 on the prior. Incidental CT findings: Degenerative changes of both hips. IMPRESSION: 1. Mild progression of floor mild primary and right-sided cervical nodal metastasis. 2. Moderate progression of isolated hepatic metastasis. 3. Similar subtle activity within the posterior right psoas muscle with possible correlate soft tissue fullness. Considerations include sequelae of prior trauma or an atypical site of metastasis. 4. Incidental findings, including: Coronary artery atherosclerosis. Aortic Atherosclerosis (ICD10-I70.0). Emphysema (ICD10-J43.9). Right nephrolithiasis. Prostatomegaly. Electronically Signed   By: Jeronimo Greaves M.D.   On: 04/13/2023 10:27

## 2023-04-14 ENCOUNTER — Inpatient Hospital Stay: Payer: 59

## 2023-04-14 ENCOUNTER — Inpatient Hospital Stay: Payer: 59 | Attending: Hematology

## 2023-04-14 ENCOUNTER — Inpatient Hospital Stay (HOSPITAL_BASED_OUTPATIENT_CLINIC_OR_DEPARTMENT_OTHER): Payer: 59 | Admitting: Hematology

## 2023-04-14 VITALS — BP 119/58 | HR 81 | Temp 97.9°F | Resp 18

## 2023-04-14 VITALS — Ht 71.0 in | Wt 146.8 lb

## 2023-04-14 DIAGNOSIS — Z9079 Acquired absence of other genital organ(s): Secondary | ICD-10-CM | POA: Insufficient documentation

## 2023-04-14 DIAGNOSIS — R21 Rash and other nonspecific skin eruption: Secondary | ICD-10-CM | POA: Insufficient documentation

## 2023-04-14 DIAGNOSIS — C049 Malignant neoplasm of floor of mouth, unspecified: Secondary | ICD-10-CM | POA: Insufficient documentation

## 2023-04-14 DIAGNOSIS — Z8547 Personal history of malignant neoplasm of testis: Secondary | ICD-10-CM | POA: Insufficient documentation

## 2023-04-14 DIAGNOSIS — C770 Secondary and unspecified malignant neoplasm of lymph nodes of head, face and neck: Secondary | ICD-10-CM | POA: Diagnosis not present

## 2023-04-14 DIAGNOSIS — Z87891 Personal history of nicotine dependence: Secondary | ICD-10-CM | POA: Diagnosis not present

## 2023-04-14 DIAGNOSIS — C787 Secondary malignant neoplasm of liver and intrahepatic bile duct: Secondary | ICD-10-CM | POA: Diagnosis not present

## 2023-04-14 DIAGNOSIS — I959 Hypotension, unspecified: Secondary | ICD-10-CM | POA: Diagnosis not present

## 2023-04-14 DIAGNOSIS — Z5111 Encounter for antineoplastic chemotherapy: Secondary | ICD-10-CM | POA: Insufficient documentation

## 2023-04-14 LAB — COMPREHENSIVE METABOLIC PANEL WITH GFR
ALT: 15 U/L (ref 0–44)
AST: 24 U/L (ref 15–41)
Albumin: 2.6 g/dL — ABNORMAL LOW (ref 3.5–5.0)
Alkaline Phosphatase: 47 U/L (ref 38–126)
Anion gap: 10 (ref 5–15)
BUN: 11 mg/dL (ref 8–23)
CO2: 25 mmol/L (ref 22–32)
Calcium: 10.1 mg/dL (ref 8.9–10.3)
Chloride: 100 mmol/L (ref 98–111)
Creatinine, Ser: 0.71 mg/dL (ref 0.61–1.24)
GFR, Estimated: >60 mL/min (ref >60)
Glucose, Bld: 115 mg/dL — ABNORMAL HIGH (ref 70–99)
Potassium: 4.1 mmol/L (ref 3.5–5.1)
Sodium: 135 mmol/L (ref 135–145)
Total Bilirubin: 0.5 mg/dL (ref 0.0–1.2)
Total Protein: 6.8 g/dL (ref 6.5–8.1)

## 2023-04-14 LAB — CBC WITH DIFFERENTIAL/PLATELET
Abs Immature Granulocytes: 0.02 10*3/uL (ref 0.00–0.07)
Basophils Absolute: 0.1 10*3/uL (ref 0.0–0.1)
Basophils Relative: 1 %
Eosinophils Absolute: 0.1 10*3/uL (ref 0.0–0.5)
Eosinophils Relative: 2 %
HCT: 32.2 % — ABNORMAL LOW (ref 39.0–52.0)
Hemoglobin: 10.2 g/dL — ABNORMAL LOW (ref 13.0–17.0)
Immature Granulocytes: 0 %
Lymphocytes Relative: 10 %
Lymphs Abs: 0.6 10*3/uL — ABNORMAL LOW (ref 0.7–4.0)
MCH: 32.3 pg (ref 26.0–34.0)
MCHC: 31.7 g/dL (ref 30.0–36.0)
MCV: 101.9 fL — ABNORMAL HIGH (ref 80.0–100.0)
Monocytes Absolute: 0.7 10*3/uL (ref 0.1–1.0)
Monocytes Relative: 12 %
Neutro Abs: 4.6 10*3/uL (ref 1.7–7.7)
Neutrophils Relative %: 75 %
Platelets: 213 10*3/uL (ref 150–400)
RBC: 3.16 MIL/uL — ABNORMAL LOW (ref 4.22–5.81)
RDW: 13.3 % (ref 11.5–15.5)
WBC: 6.1 10*3/uL (ref 4.0–10.5)
nRBC: 0 % (ref 0.0–0.2)

## 2023-04-14 LAB — MAGNESIUM: Magnesium: 1.4 mg/dL — ABNORMAL LOW (ref 1.7–2.4)

## 2023-04-14 MED ORDER — SODIUM CHLORIDE 0.9 % IV SOLN
INTRAVENOUS | Status: DC
Start: 2023-04-14 — End: 2023-04-14

## 2023-04-14 MED ORDER — ONDANSETRON HCL 4 MG/2ML IJ SOLN
8.0000 mg | Freq: Once | INTRAMUSCULAR | Status: AC
Start: 2023-04-14 — End: 2023-04-14
  Administered 2023-04-14: 8 mg via INTRAVENOUS
  Filled 2023-04-14: qty 4

## 2023-04-14 MED ORDER — SODIUM CHLORIDE 0.9% FLUSH
10.0000 mL | INTRAVENOUS | Status: DC | PRN
Start: 2023-04-14 — End: 2023-04-14
  Administered 2023-04-14: 10 mL

## 2023-04-14 MED ORDER — MAGNESIUM SULFATE 4 GM/100ML IV SOLN
4.0000 g | Freq: Once | INTRAVENOUS | Status: AC
  Administered 2023-04-14: 4 g via INTRAVENOUS
  Filled 2023-04-14: qty 100

## 2023-04-14 MED ORDER — SODIUM CHLORIDE 0.9 % IV SOLN
25.0000 mg/m2 | Freq: Once | INTRAVENOUS | Status: AC
  Administered 2023-04-14: 46 mg via INTRAVENOUS
  Filled 2023-04-14: qty 4.6

## 2023-04-14 MED ORDER — SODIUM CHLORIDE 0.9% FLUSH
10.0000 mL | Freq: Once | INTRAVENOUS | Status: AC
  Administered 2023-04-14: 10 mL via INTRAVENOUS

## 2023-04-14 MED ORDER — DEXAMETHASONE SODIUM PHOSPHATE 10 MG/ML IJ SOLN
10.0000 mg | Freq: Once | INTRAMUSCULAR | Status: AC
  Administered 2023-04-14: 10 mg via INTRAVENOUS
  Filled 2023-04-14: qty 1

## 2023-04-14 MED ORDER — HEPARIN SOD (PORK) LOCK FLUSH 100 UNIT/ML IV SOLN
500.0000 [IU] | Freq: Once | INTRAVENOUS | Status: AC | PRN
Start: 2023-04-14 — End: 2023-04-14
  Administered 2023-04-14: 500 [IU]

## 2023-04-14 NOTE — Progress Notes (Signed)
 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

## 2023-04-14 NOTE — Progress Notes (Signed)
Ok to treat with HR 109  T.[address] Carilyn Goodpasture, PharmD

## 2023-04-14 NOTE — Patient Instructions (Signed)
 [facility] at [facility] Discharge Instructions   You were seen and examined today by Dr. Ellin Saba.  He reviewed the results of your lab work which are mostly normal/stable. Your magnesium is very low at 1.4. We will give you IV magnesium in the clinic today.   He reviewed the results of your PET scan. It is showing that the cancer in the mouth and the liver has grown. We will need to stop the Erbitux as it is not helping to control the cancer anymore. There are no other non-chemotherapy options for treatment. Dr. Kirtland Bouchard discussed with you active treatment versus no treatment and a referral to hospice.   We will hold your treatment today.   Return as scheduled.    Thank you for choosing [facility] at [facility] to provide your oncology and hematology care.  To afford each patient quality time with our provider, please arrive at least 15 minutes before your scheduled appointment time.   If you have a lab appointment with the Cancer Center please come in thru the Main Entrance and check in at the main information desk.  You need to re-schedule your appointment should you arrive 10 or more minutes late.  We strive to give you quality time with our providers, and arriving late affects you and other patients whose appointments are after yours.  Also, if you no show three or more times for appointments you may be dismissed from the clinic at the providers discretion.     Again, thank you for choosing [facility].  Our hope is that these requests will decrease the amount of time that you wait before being seen by our physicians.       _____________________________________________________________  Should you have questions after your visit to [facility], please contact our office at [phone] and follow the prompts.  Our office hours are 8:00 a.m. and 4:30 p.m. Monday - Friday.  Please note that voicemails left after 4:00  p.m. may not be returned until the following business day.  We are closed weekends and major holidays.  You do have access to a nurse 24-7, just call the main number to the clinic [phone] and do not press any options, hold on the line and a nurse will answer the phone.    For prescription refill requests, have your pharmacy contact our office and allow 72 hours.    Due to Covid, you will need to wear a mask upon entering the hospital. If you do not have a mask, a mask will be given to you at the Main Entrance upon arrival. For doctor visits, patients may have 1 support person age 22 or older with them. For treatment visits, patients can not have anyone with them due to social distancing guidelines and our immunocompromised population.

## 2023-04-14 NOTE — Patient Instructions (Signed)
 [facility] - A DEPT OF [facility]  Discharge Instructions: Thank you for choosing [facility] to provide your oncology and hematology care.  If you have a lab appointment with the Cancer Center - please note that after April 8th, 2024, all labs will be drawn in the cancer center.  You do not have to check in or register with the main entrance as you have in the past but will complete your check-in in the cancer center.  Wear comfortable clothing and clothing appropriate for easy access to any Portacath or PICC line.   We strive to give you quality time with your provider. You may need to reschedule your appointment if you arrive late (15 or more minutes).  Arriving late affects you and other patients whose appointments are after yours.  Also, if you miss three or more appointments without notifying the office, you may be dismissed from the clinic at the provider's discretion.      For prescription refill requests, have your pharmacy contact our office and allow 72 hours for refills to be completed.    Today you received the following chemotherapy and/or immunotherapy agents Taxotere   To help prevent nausea and vomiting after your treatment, we encourage you to take your nausea medication as directed.  BELOW ARE SYMPTOMS THAT SHOULD BE REPORTED IMMEDIATELY: *FEVER GREATER THAN 100.4 F (38 C) OR HIGHER *CHILLS OR SWEATING *NAUSEA AND VOMITING THAT IS NOT CONTROLLED WITH YOUR NAUSEA MEDICATION *UNUSUAL SHORTNESS OF BREATH *UNUSUAL BRUISING OR BLEEDING *URINARY PROBLEMS (pain or burning when urinating, or frequent urination) *BOWEL PROBLEMS (unusual diarrhea, constipation, pain near the anus) TENDERNESS IN MOUTH AND THROAT WITH OR WITHOUT PRESENCE OF ULCERS (sore throat, sores in mouth, or a toothache) UNUSUAL RASH, SWELLING OR PAIN  UNUSUAL VAGINAL DISCHARGE OR ITCHING   Items with * indicate a potential emergency and should be followed up as  soon as possible or go to the Emergency Department if any problems should occur.  Please show the CHEMOTHERAPY ALERT CARD or IMMUNOTHERAPY ALERT CARD at check-in to the Emergency Department and triage nurse.  Should you have questions after your visit or need to cancel or reschedule your appointment, please contact [facility] [place] - A DEPT OF [facility] [phone]  and follow the prompts.  Office hours are 8:00 a.m. to 4:30 p.m. Monday - Friday. Please note that voicemails left after 4:00 p.m. may not be returned until the following business day.  We are closed weekends and major holidays. You have access to a nurse at all times for urgent questions. Please call the main number to the clinic [phone] and follow the prompts.  For any non-urgent questions, you may also contact your provider using MyChart. We now offer e-Visits for anyone 16 and older to request care online for non-urgent symptoms. For details visit [email or web address].   Also download the MyChart app! Go to the app store, search "MyChart", open the app, select [facility], and log in with your MyChart username and password.

## 2023-04-14 NOTE — Progress Notes (Signed)
 Patient presents today for D1C1 Taxotere infusion per providers order.  Vital signs and labs reviewed by MD.  Message received from Chapman Moss RN/Dr. Ellin Saba patient okay for treatment.  Patient will also receive 4 grams magnesium per providers order.     Treatment given today per MD orders.  Stable during infusion without adverse affects.  Vital signs stable.  No complaints at this time.  Discharge from clinic ambulatory in stable condition.  Alert and oriented X 3.  Follow up with [facility] as scheduled.

## 2023-04-15 ENCOUNTER — Telehealth: Payer: Self-pay

## 2023-04-15 ENCOUNTER — Other Ambulatory Visit: Payer: Self-pay

## 2023-04-15 NOTE — Telephone Encounter (Signed)
 24 hour post chemotherapy call back: per pt she is feeling well, no nausea, and no fatigue. RN educated pt on the importance to notifying the clinic if any complications occur and when to seek emergency care, pt verbalized understanding all questions answered at this time. Cancer center number given to patient.   Kedric Bumgarner [employer]

## 2023-04-16 NOTE — Progress Notes (Signed)
  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 No voicemail available.   The patient has scheduled follow up with his medical oncologist Dr. Ellin Saba  for ongoing surveillance, and was encouraged to call if he develops concerns or questions regarding radiation.    Ruel Favors, LPN

## 2023-04-19 ENCOUNTER — Ambulatory Visit
Admission: RE | Admit: 2023-04-19 | Discharge: 2023-04-19 | Disposition: A | Discharge status: Home / Self Care | Source: Ambulatory Visit | Attending: Radiation Oncology | Admitting: Radiation Oncology

## 2023-04-19 DIAGNOSIS — Z87891 Personal history of nicotine dependence: Secondary | ICD-10-CM | POA: Insufficient documentation

## 2023-04-19 DIAGNOSIS — C787 Secondary malignant neoplasm of liver and intrahepatic bile duct: Secondary | ICD-10-CM | POA: Insufficient documentation

## 2023-04-19 DIAGNOSIS — C01 Malignant neoplasm of base of tongue: Secondary | ICD-10-CM | POA: Insufficient documentation

## 2023-04-20 NOTE — Progress Notes (Signed)
 Nathaniel Hicks [address] [place] [address]    Clinic Day:  04/21/2023  Referring physician: [name], MD  Patient Care Team: Nathaniel Stabile, MD as PCP - General (Internal Medicine) Nathaniel Askew, MD as Consulting Physician (Urology) Nathaniel Massed, MD as Medical Oncologist (Medical Oncology) Nathaniel Sarah, RN as Oncology Nurse Navigator (Medical Oncology)   ASSESSMENT & PLAN:   Assessment: 1.  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  Enlarged contralateral right submandibular node.  Enlargement of the left submandibular gland probably reactive.  No abscess. - Biopsy (07/18/2021) floor of the mouth: Invasive well to moderately differentiated keratinizing squamous cell carcinoma.  Tumor cells negative for p16. - 10/14/2021: Floor of the mouth resection, tracheostomy, bilateral selective neck dissections zones 1-3 by Dr. Jenne Hicks - Pathology: Invasive moderately differentiated keratinizing SCC, 4.1 cm, carcinoma invades for a depth of about 1.9 cm and involves saliva gland tissue.  Anterior, posterior, right, left resection margins are involved.  Deep resection margin is negative.  Metastatic carcinoma 1/3 level 1 lymph nodes.  3 level 2 and 3 level 3 lymph nodes negative for carcinoma.  0/10 lymph nodes involved in the left side neck zone 1, 2, 3 dissection.  ENE not identified.  LVI/perineural invasion not identified. - I have talked to pathologist.  Reexcision margins were considered negative.  He has a high risk feature which is T4 tumor. -  PET scan on 12/12/2021: Soft tissue thickening and calcification along the ventral aspect of the trachea with associated hypermetabolism corresponding to recent tracheostomy site.  7 mm right  paratracheal lymph node new with SUV 2.4.  Hypermetabolism along the dome of the right hepatic lobe with probable 2.4 cm low-attenuation lesion - Liver lesion biopsy (01/28/2022): Metastatic squamous cell carcinoma, keratinizing. - NGS testing: PD-L1 (22 C3): CPS: 100, CD274 (PD-L1) amplified, JAK2 amplified, PIK3CA pathogenic variant exon 10, T p53 pathogenic variant, MS-stable, TMB-low - Per Dr. Jenne Hicks, he also has local recurrence of disease in the floor of the mouth. - Keytruda from 03/10/2022 through 08/24/2022 with progression - XRT to the floor of the mouth on 06/29/2022 - PET scan 09/10/2022: Liver lesion increased in size measures 3.1 x 2.9 cm, previously 2.6 x 1.7 cm.  Right cervical lymph node meta stasis, mildly progressive. - HER2 by IHC 0 on biopsy from 01/28/2022. - Weekly cetuximab started on 10/07/2022, discontinued on 04/14/2023 due to progression - PET scan (12/24/2022): Hypermetabolic anterior floor of mouth mass stable.  Stable right level 1B nodal metastasis.  Hypermetabolic 4.6 cm right liver dome metastasis increased in size by 8 mm.  No new sites of hypermetabolic metastatic disease. - MRI liver (01/07/2023): Right hepatic liver lesion measures 4.8 x 3.3 cm.  No new hepatic lesions seen. - Weekly docetaxel started on 04/14/2023   2.  Social/family history: - He lives in his apartment by himself and is independent of ADLs and IADLs.  He does not drive.  He worked as a Curator and several other jobs.  Quit smoking 1 month ago.  He smoked 1 pack/week for more than 50 years. - 1 brother had agent orange related cancer.  Another brother also had cancer, type unknown to the patient.   3.  Stage Ib pure seminoma: - Status post  right radical orchiectomy on 12/20/2012. - He was on close surveillance rather than adjuvant chemotherapy.    Plan: 1.  Stage IV (PT4PN1 M1) SCC of the floor of the mouth, p16 negative: - He has tolerated dose reduced docetaxel very well on 04/14/2023 with no GI  side effects. - Reviewed labs today: Calcium 10.5, albumin 2.7.  LFTs are normal.  CBC grossly normal. - Proceed with week 2 of docetaxel at 25 mg/m and week 3 next week.  I will see him back in 2 weeks for follow-up.  If he tolerates well, may increase docetaxel to 35 mg/m.   2.  Oral pain: - Continue liquid hydrocodone twice daily as needed.  Pain is well-controlled.  Lost about 8 pounds.   3.  Cetuximab skin rash: - Continue doxycycline 100 mg twice daily for another 2 weeks.   4.  Hypotension: - Continue to hold antihypertensives as his blood pressure today is 128/61.   5.  Hypomagnesemia: -Continue magnesium 2 tablets twice daily.  He cannot tolerate higher dose than that.  Magnesium today is 1.5.  He will receive IV magnesium.  6.  Hypercalcemia: - Calcium today is 10.5 with albumin 2.7.  He will receive Zometa 4 mg IV x 1 and 1 L of fluid with electrolytes.    No orders of the defined types were placed in this encounter.     I,Nathaniel Hicks,acting as a Neurosurgeon for Nathaniel Massed, MD.,have documented all relevant documentation on the behalf of Nathaniel Massed, MD,as directed by  Nathaniel Massed, MD while in the presence of Nathaniel Massed, MD.   I, Nathaniel Massed MD, have reviewed the above documentation for accuracy and completeness, and I agree with the above.   Nathaniel Massed, MD   4/9/202510:55 AM  CHIEF COMPLAINT:   Diagnosis: squamous cell carcinoma of the floor of the mouth, history of testicular seminoma    Cancer Staging  Cancer of floor of mouth [facility]) Staging form: Oral Cavity, AJCC 8th Edition - Clinical stage from 11/25/2021: Stage IVC (cT4a, cN1, pM1) - Signed by Nathaniel Massed, MD on 02/04/2022  Testicle cancer, right Staging form: Testis, AJCC 7th Edition - Clinical: Stage I (T2, N0, M0) - Signed by Nathaniel Newer, PA-C on 07/23/2013    Prior Therapy: 1. Pembrolizumab, 03/10/22 - 08/24/22 2. XRT to floor of  mouth, 06/17/22 - 08/19/22 3. SBRT to liver lesion, 03/15/23 - 03/26/23 4. Cetuximab weekly, 10/07/22 - 04/14/23  Current Therapy:  docetaxel weekly    HISTORY OF PRESENT ILLNESS:   Oncology History  Testicle cancer, right  12/20/2012 Surgery   Right total orchiectomyt- 1. Testis, biopsy, right - SEMINOMA. PLEASE SEE COMMENT. 2. Testis, tumor, right - SEMINOMA, 7.5 CM. - ANGIOLYMPHATIC INVASION PRESENT. - RESECTION MARGINS, NEGATIVE FOR ATYPIA OR MALIGNANCY.   01/18/2013 PET scan   Status post right orchiectomy. No findings specific for metastatic disease. Small para-aortic nodes measuring up to 5 mm short axis, without convincing hypermetabolism. Given location, attention on follow-up is suggested.   04/17/2013 Imaging   CT CAP- No evidence of metastatic disease in the chest, abdomen or pelvis. Proximal LAD coronary artery calcification.   07/20/2013 Imaging   CT abd/pelvis- No evidence of metastatic disease in the abdomen or pelvis.   10/24/2013 Imaging   CT abd/pelvis- No findings to suggest metastatic disease in the abdomen or pelvis   10/24/2013 Imaging   Chest xray- Probable COPD.  Negative for metastatic disease.   02/21/2014 Imaging   CT abd/pelvis- Stable abdominal  pelvic CT status post right orchectomy. No evidence of adenopathy or other metastatic disease.   02/21/2014 Imaging   Chest xray- Left lower lobe mild atelectasis and/or infiltrate.     09/10/2014 Imaging   CT abd/pelvis- Status post right orchiectomy.   No evidence of metastatic disease.   3 mm nonobstructing right upper pole renal calculus. No hydronephrosis.   03/14/2015 Imaging   CT abd/pelvis- Stable exam. No evidence of metastatic disease or other acute findings within the abdomen or pelvis.   09/13/2015 Imaging   CT abd/pelvis- No acute findings and no evidence for mass or adenopathy.    04/24/2016 Imaging   CT abd/pelvis: IMPRESSION: 1. Stable exam. No new or progressive findings. No features to suggest metastatic  disease.   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HCC)  10/14/2021 Initial Diagnosis   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HCC)   11/25/2021 Cancer Staging   Staging form: Oral Cavity, AJCC 8th Edition - Clinical stage from 11/25/2021: Stage IVC (cT4a, cN1, pM1) - Signed by Nathaniel Massed, MD on 02/04/2022 Histopathologic type: Squamous cell carcinoma, NOS Stage prefix: Initial diagnosis Histologic grade (G): G2 Histologic grading system: 3 grade system   03/10/2022 - 08/24/2022 Chemotherapy   Patient is on Treatment Plan : HEAD/NECK Pembrolizumab (200) q21d     10/07/2022 - 03/31/2023 Chemotherapy   Patient is on Treatment Plan : HEAD/NECK Cetuximab q7d     04/14/2023 -  Chemotherapy   Patient is on Treatment Plan : Head and Neck Docetaxel (35) q7d        INTERVAL HISTORY:   Nathaniel Hicks is a 83 y.o. male presenting to clinic today for follow up of squamous cell carcinoma of the floor of the mouth, history of testicular seminoma. He was last seen by me on 04/14/23.  Today, he states that he is doing well overall. His appetite level is at 70%. His energy level is at 50%.  PAST MEDICAL HISTORY:   Past Medical History: Past Medical History:  Diagnosis Date   Arthritis    BPH (benign prostatic hyperplasia)    Difficult intubation    HOH (hard of hearing)    Hyperlipidemia 11/02/2017   Hypertension    Hypertension 11/02/2017   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HCC)    2014   Vitamin D deficiency 11/02/2017    Surgical History: Past Surgical History:  Procedure Laterality Date   COLONOSCOPY N/A 11/24/2012   Procedure: COLONOSCOPY;  Surgeon: Malissa Hippo, MD;  Location: [facility];  Service: Endoscopy;  Laterality: N/A;  830-moved to 730 Ann notified pt   FLOOR OF MOUTH BIOPSY N/A 10/14/2021   Procedure: FLOOR OF MOUTH RESECTION;  Surgeon: Christia Reading, MD;  Location: [facility] OR;  Service: ENT;  Laterality: N/A;   HEMORROIDECTOMY     KNEE ARTHROSCOPY WITH LATERAL MENISECTOMY Right 08/31/2017   Procedure: KNEE  ARTHROSCOPY WITH LATERAL MENISECTOMY;  Surgeon: Vickki Hearing, MD;  Location: [facility];  Service: Orthopedics;  Laterality: Right;   LESION EXCISION N/A 03/23/2012   Procedure: EXCISION NEOPLASM SCALP ;  Surgeon: Dalia Heading, MD;  Location: [facility];  Service: General;  Laterality: N/A;  Excision of Scalp Neoplasm   ORCHIECTOMY Right 12/20/2012   Procedure: RIGHT RADICAL ORCHIECTOMY/POSSIBLE BX RIGHT TESTICLE;  Surgeon: Ky Barban, MD;  Location: [facility];  Service: Urology;  Laterality: Right;   PORTACATH PLACEMENT Left 03/25/2022   Procedure: INSERTION PORT-A-CATH;  Surgeon: Franky Macho, MD;  Location: [facility];  Service: General;  Laterality: Left;   PROSTATE  SURGERY     RADICAL NECK DISSECTION Bilateral 10/14/2021   Procedure: NECK DISSECTION;  Surgeon: Christia Reading, MD;  Location: [facility] OR;  Service: ENT;  Laterality: Bilateral;   SCALP LACERATION REPAIR     [facility]-Dr Katrinka Blazing   SKIN FULL THICKNESS GRAFT Bilateral 10/14/2021   Procedure: PLATYSMA FLAP CLOSURE;  Surgeon: Christia Reading, MD;  Location: [facility] OR;  Service: ENT;  Laterality: Bilateral;   TOOTH EXTRACTION  10/14/2021   Procedure: DENTAL EXTRACTIONS;  Surgeon: Christia Reading, MD;  Location: [facility] OR;  Service: ENT;;   TRACHEOSTOMY TUBE PLACEMENT N/A 10/14/2021   Procedure: TRACHEOSTOMY;  Surgeon: Christia Reading, MD;  Location: [facility] OR;  Service: ENT;  Laterality: N/A;    Social History: Social History   Socioeconomic History   Marital status: Single    Spouse name: Not on file   Number of children: Not on file   Years of education: Not on file   Highest education level: Not on file  Occupational History   Not on file  Tobacco Use   Smoking status: Former    Current packs/day: 0.25    Average packs/day: 0.3 packs/day for 50.0 years (12.5 ttl pk-yrs)    Types: Cigarettes   Smokeless tobacco: Never  Vaping Use   Vaping status: Never Used  Substance and Sexual Activity   Alcohol use: Not Currently   Drug use: No   Sexual  activity: Yes    Birth control/protection: None  Other Topics Concern   Not on file  Social History Narrative   Not on file   Social Drivers of Health   Financial Resource Strain: Not on file  Food Insecurity: Not on file  Transportation Needs: Not on file  Physical Activity: Not on file  Stress: Not on file  Social Connections: Not on file  Intimate Partner Violence: Not on file    Family History: Family History  Problem Relation Age of Onset   Cancer Brother    Cancer Brother     Current Medications:  Current Outpatient Medications:    acetaminophen (TYLENOL) 650 MG CR tablet, Take 650 mg by mouth every 8 (eight) hours as needed for pain., Disp: , Rfl:    amLODipine-olmesartan (AZOR) 5-20 MG tablet, TAKE 1 TABLET BY MOUTH DAILY, Disp: 90 tablet, Rfl: 0   budesonide-formoterol (SYMBICORT) 80-4.5 MCG/ACT inhaler, Inhale 2 puffs into the lungs in the morning and at bedtime., Disp: 1 each, Rfl: 12   chlorhexidine (PERIDEX) 0.12 % solution, Use as directed 5 mLs in the mouth or throat 2 (two) times daily., Disp: , Rfl:    Cholecalciferol (VITAMIN D3) 50 MCG (2000 UT) TABS, Take 2,000 Units by mouth daily., Disp: , Rfl:    ciprofloxacin-dexamethasone (CIPRODEX) OTIC suspension, Place 4 drops into the left ear 2 (two) times daily., Disp: , Rfl:    clindamycin (CLINDAGEL) 1 % gel, Apply topically 2 (two) times daily. Apply twice daily to chest rash, Disp: 30 g, Rfl: 0   doxycycline (VIBRA-TABS) 100 MG tablet, TAKE 1 TABLET(100 MG) BY MOUTH TWICE DAILY, Disp: 60 tablet, Rfl: 3   HYDROcodone-acetaminophen (HYCET) 7.5-325 mg/15 ml solution, Take 15 mLs by mouth every 6 (six) hours as needed for moderate pain (pain score 4-6)., Disp: 473 mL, Rfl: 0   lidocaine (XYLOCAINE) 2 % solution, Take by mouth., Disp: , Rfl:    magnesium oxide (MAG-OX) 400 MG tablet, Take 2 tablets (800 mg total) by mouth 2 (two) times daily., Disp: 120 tablet, Rfl: 3   Menthol, Topical  Analgesic, (BIOFREEZE EX),  Apply 1 application  topically daily as needed (pain)., Disp: , Rfl:    naloxone (NARCAN) nasal spray 4 mg/0.1 mL, SMARTSIG:Both Nares, Disp: , Rfl:    Polyethyl Glycol-Propyl Glycol (GOODSENSE LUBRICANT EYE DROPS OP), Place 1 drop into both eyes daily as needed (dry eyes)., Disp: , Rfl:    pravastatin (PRAVACHOL) 80 MG tablet, Take 80 mg by mouth daily., Disp: , Rfl:    predniSONE (DELTASONE) 10 MG tablet, Take  4 each am x 2 days,   2 each am x 2 days,  1 each am x 2 days and stop, Disp: 14 tablet, Rfl: 0   RAPAFLO 8 MG CAPS capsule, Take 8 mg by mouth daily. Silodosin, Disp: , Rfl:    vitamin B-12 (CYANOCOBALAMIN) 500 MCG tablet, Take 500 mcg by mouth daily., Disp: , Rfl:  No current facility-administered medications for this visit.  Facility-Administered Medications Ordered in Other Visits:    0.9 %  sodium chloride infusion, , Intravenous, Continuous, Nathaniel Massed, MD, Last Rate: 10 mL/hr at 04/21/23 1035, New Bag at 04/21/23 1035   0.9 % NaCl with KCl 20 mEq/ L  infusion, , Intravenous, Once, Nathaniel Massed, MD   DOCEtaxel (TAXOTERE) 46 mg in sodium chloride 0.9 % 150 mL chemo infusion, 25 mg/m2 (Treatment Plan Recorded), Intravenous, Once, Nathaniel Massed, MD   heparin lock flush 100 unit/mL, 500 Units, Intracatheter, Once PRN, Nathaniel Massed, MD   magnesium sulfate IVPB 2 g 50 mL, 2 g, Intravenous, Once, Nathaniel Massed, MD   sodium chloride flush (NS) 0.9 % injection 10 mL, 10 mL, Intracatheter, PRN, Nathaniel Massed, MD   Zoledronic Acid (ZOMETA) IVPB 4 mg, 4 mg, Intravenous, Once, Nathaniel Massed, MD, Last Rate: 400 mL/hr at 04/21/23 1054, 4 mg at 04/21/23 1054   Allergies: No Known Allergies  REVIEW OF SYSTEMS:   Review of Systems  Constitutional:  Negative for chills, fatigue and fever.  HENT:   Positive for trouble swallowing. Negative for lump/mass, mouth sores, nosebleeds and sore throat.   Eyes:  Negative for eye problems.   Respiratory:  Negative for cough and shortness of breath.   Cardiovascular:  Negative for chest pain, leg swelling and palpitations.  Gastrointestinal:  Negative for abdominal pain, constipation, diarrhea, nausea and vomiting.  Genitourinary:  Negative for bladder incontinence, difficulty urinating, dysuria, frequency, hematuria and nocturia.   Musculoskeletal:  Negative for arthralgias, back pain, flank pain, myalgias and neck pain.  Skin:  Negative for itching and rash.  Neurological:  Positive for numbness. Negative for dizziness and headaches.  Hematological:  Does not bruise/bleed easily.  Psychiatric/Behavioral:  Negative for depression, sleep disturbance and suicidal ideas. The patient is not nervous/anxious.   All other systems reviewed and are negative.    VITALS:   Pulse (!) 55, temperature (!) 96.6 F (35.9 C), temperature source Tympanic, resp. rate 20, weight 143 lb 11.8 oz (65.2 kg), SpO2 98%.  Wt Readings from Last 3 Encounters:  04/21/23 143 lb 11.8 oz (65.2 kg)  04/14/23 146 lb 13.2 oz (66.6 kg)  03/31/23 153 lb 6.4 oz (69.6 kg)    Body mass index is 20.05 kg/m.  Performance status (ECOG): 1 - Symptomatic but completely ambulatory  PHYSICAL EXAM:   Physical Exam Vitals and nursing note reviewed. Exam conducted with a chaperone present.  Constitutional:      Appearance: Normal appearance.  Cardiovascular:     Rate and Rhythm: Normal rate and regular rhythm.     Pulses: Normal pulses.  Heart sounds: Normal heart sounds.  Pulmonary:     Effort: Pulmonary effort is normal.     Breath sounds: Normal breath sounds.  Abdominal:     Palpations: Abdomen is soft. There is no hepatomegaly, splenomegaly or mass.     Tenderness: There is no abdominal tenderness.  Musculoskeletal:     Right lower leg: No edema.     Left lower leg: No edema.  Lymphadenopathy:     Cervical: No cervical adenopathy.     Right cervical: No superficial, deep or posterior cervical  adenopathy.    Left cervical: No superficial, deep or posterior cervical adenopathy.     Upper Body:     Right upper body: No supraclavicular or axillary adenopathy.     Left upper body: No supraclavicular or axillary adenopathy.  Neurological:     General: No focal deficit present.     Mental Status: He is alert and oriented to person, place, and time.  Psychiatric:        Mood and Affect: Mood normal.        Behavior: Behavior normal.     LABS:   CBC     Component Value Date/Time   WBC 4.1 04/21/2023 0847   RBC 3.24 (L) 04/21/2023 0847   HGB 10.5 (L) 04/21/2023 0847   HCT 32.7 (L) 04/21/2023 0847   PLT 212 04/21/2023 0847   MCV 100.9 (H) 04/21/2023 0847   MCH 32.4 04/21/2023 0847   MCHC 32.1 04/21/2023 0847   RDW 13.1 04/21/2023 0847   LYMPHSABS 0.7 04/21/2023 0847   MONOABS 0.4 04/21/2023 0847   EOSABS 0.1 04/21/2023 0847   BASOSABS 0.0 04/21/2023 0847    CMP      Component Value Date/Time   NA 134 (L) 04/21/2023 0847   K 3.6 04/21/2023 0847   CL 99 04/21/2023 0847   CO2 24 04/21/2023 0847   GLUCOSE 118 (H) 04/21/2023 0847   BUN 11 04/21/2023 0847   CREATININE 0.75 04/21/2023 0847   CALCIUM 10.5 (H) 04/21/2023 0847   PROT 6.7 04/21/2023 0847   ALBUMIN 2.7 (L) 04/21/2023 0847   AST 30 04/21/2023 0847   ALT 21 04/21/2023 0847   ALKPHOS 55 04/21/2023 0847   BILITOT 0.7 04/21/2023 0847   GFRNONAA >60 04/21/2023 0847   GFRAA >60 10/26/2018 1137     Lab Results  Component Value Date   CEA 2.2 01/19/2013   /  CEA  Date Value Ref Range Status  01/19/2013 2.2 0.0 - 5.0 ng/mL Final    Comment:    Performed at [facility]   No results found for: "PSA1" No results found for: "CAN199" No results found for: "CAN125"  No results found for: "TOTALPROTELP", "ALBUMINELP", "A1GS", "A2GS", "BETS", "BETA2SER", "GAMS", "MSPIKE", "SPEI" Lab Results  Component Value Date   TIBC 244 (L) 08/04/2022   TIBC 251 05/12/2022   FERRITIN 273 08/04/2022    FERRITIN 195 05/12/2022   IRONPCTSAT 19 08/04/2022   IRONPCTSAT 18 05/12/2022   Lab Results  Component Value Date   LDH 172 11/12/2020   LDH 175 10/26/2018   LDH 179 10/25/2013     STUDIES:   NM PET Image Restag (PS) Skull Base To Thigh Result Date: 04/13/2023 CLINICAL DATA:  Subsequent treatment strategy for cancer of floor of mouth. Restaging. Radiation last week. EXAM: NUCLEAR MEDICINE PET SKULL BASE TO THIGH TECHNIQUE: 6.9 mCi F-18 FDG was injected intravenously. Full-ring PET imaging was performed from the skull base to thigh after the  radiotracer. CT data was obtained and used for attenuation correction and anatomic localization. Fasting blood glucose: 132 mg/dl COMPARISON:  62/95/2841 FINDINGS: Mediastinal blood pool activity: SUV max 2.3 Liver activity: SUV max NA NECK: Right-sided floor mild mass measures 4.2 x 4.0 cm and a S.U.V. max of 8.8 on 43/202. Compare 4.2 x 2.8 cm and a S.U.V. max of 10.4. Persistent anterior mandibular osseous destruction. Right-sided level 1 B node measures 2.3 cm and a S.U.V. max of 7.8 on 48/202 versus 1.1 cm and a S.U.V. max of 7.5 on the prior. Right-sided level 4 node measures 2.4 cm and a S.U.V. max of 10.3 today versus 1.3 cm and a S.U.V. max of 9.4 on the prior. Incidental CT findings: Bilateral carotid atherosclerosis. Cerebral atrophy. CHEST: No pulmonary parenchymal or thoracic nodal hypermetabolism. Incidental CT findings: Left Port-A-Cath tip low SVC. Mild cardiomegaly. Aortic and coronary artery calcification. Centrilobular emphysema. Bibasilar scarring. ABDOMEN/PELVIS: High right hepatic lobe mass measures 8.3 x 5.5 cm and a S.U.V. max of 9.3 on 98/202 versus 4.6 by 3.3 cm and a S.U.V. max of 8.9. No abdominopelvic nodal hypermetabolism. Incidental CT findings: Normal adrenal glands. Lower pole right renal punctate collecting system calculus. Subcentimeter interpolar left renal cyst . In the absence of clinically indicated signs/symptoms require(s) no  independent follow-up. Moderate prostatomegaly. SKELETON: No abnormal marrow hypermetabolism. Subtle focus of hypermetabolism within the right psoas posteriorly may correspond to subtle soft tissue fullness on 138/202. This measures a S.U.V. max of 1.9 today versus a S.U.V. max of 1.4 on the prior. Incidental CT findings: Degenerative changes of both hips. IMPRESSION: 1. Mild progression of floor mild primary and right-sided cervical nodal metastasis. 2. Moderate progression of isolated hepatic metastasis. 3. Similar subtle activity within the posterior right psoas muscle with possible correlate soft tissue fullness. Considerations include sequelae of prior trauma or an atypical site of metastasis. 4. Incidental findings, including: Coronary artery atherosclerosis. Aortic Atherosclerosis (ICD10-I70.0). Emphysema (ICD10-J43.9). Right nephrolithiasis. Prostatomegaly. Electronically Signed   By: Jeronimo Greaves M.D.   On: 04/13/2023 10:27

## 2023-04-21 ENCOUNTER — Inpatient Hospital Stay

## 2023-04-21 ENCOUNTER — Inpatient Hospital Stay (HOSPITAL_BASED_OUTPATIENT_CLINIC_OR_DEPARTMENT_OTHER): Admitting: Hematology

## 2023-04-21 VITALS — BP 129/74 | HR 84 | Temp 97.9°F | Resp 18

## 2023-04-21 DIAGNOSIS — R21 Rash and other nonspecific skin eruption: Secondary | ICD-10-CM | POA: Diagnosis not present

## 2023-04-21 DIAGNOSIS — C049 Malignant neoplasm of floor of mouth, unspecified: Secondary | ICD-10-CM

## 2023-04-21 DIAGNOSIS — I959 Hypotension, unspecified: Secondary | ICD-10-CM | POA: Diagnosis not present

## 2023-04-21 DIAGNOSIS — C787 Secondary malignant neoplasm of liver and intrahepatic bile duct: Secondary | ICD-10-CM | POA: Diagnosis not present

## 2023-04-21 DIAGNOSIS — Z5111 Encounter for antineoplastic chemotherapy: Secondary | ICD-10-CM | POA: Diagnosis not present

## 2023-04-21 DIAGNOSIS — Z87891 Personal history of nicotine dependence: Secondary | ICD-10-CM | POA: Diagnosis not present

## 2023-04-21 DIAGNOSIS — C770 Secondary and unspecified malignant neoplasm of lymph nodes of head, face and neck: Secondary | ICD-10-CM | POA: Diagnosis not present

## 2023-04-21 LAB — CBC WITH DIFFERENTIAL/PLATELET
Abs Immature Granulocytes: 0.04 10*3/uL (ref 0.00–0.07)
Basophils Absolute: 0 10*3/uL (ref 0.0–0.1)
Basophils Relative: 1 %
Eosinophils Absolute: 0.1 10*3/uL (ref 0.0–0.5)
Eosinophils Relative: 2 %
HCT: 32.7 % — ABNORMAL LOW (ref 39.0–52.0)
Hemoglobin: 10.5 g/dL — ABNORMAL LOW (ref 13.0–17.0)
Immature Granulocytes: 1 %
Lymphocytes Relative: 17 %
Lymphs Abs: 0.7 10*3/uL (ref 0.7–4.0)
MCH: 32.4 pg (ref 26.0–34.0)
MCHC: 32.1 g/dL (ref 30.0–36.0)
MCV: 100.9 fL — ABNORMAL HIGH (ref 80.0–100.0)
Monocytes Absolute: 0.4 10*3/uL (ref 0.1–1.0)
Monocytes Relative: 10 %
Neutro Abs: 2.8 10*3/uL (ref 1.7–7.7)
Neutrophils Relative %: 69 %
Platelets: 212 10*3/uL (ref 150–400)
RBC: 3.24 MIL/uL — ABNORMAL LOW (ref 4.22–5.81)
RDW: 13.1 % (ref 11.5–15.5)
WBC: 4.1 10*3/uL (ref 4.0–10.5)
nRBC: 0 % (ref 0.0–0.2)

## 2023-04-21 LAB — COMPREHENSIVE METABOLIC PANEL WITH GFR
ALT: 21 U/L (ref 0–44)
AST: 30 U/L (ref 15–41)
Albumin: 2.7 g/dL — ABNORMAL LOW (ref 3.5–5.0)
Alkaline Phosphatase: 55 U/L (ref 38–126)
Anion gap: 11 (ref 5–15)
BUN: 11 mg/dL (ref 8–23)
CO2: 24 mmol/L (ref 22–32)
Calcium: 10.5 mg/dL — ABNORMAL HIGH (ref 8.9–10.3)
Chloride: 99 mmol/L (ref 98–111)
Creatinine, Ser: 0.75 mg/dL (ref 0.61–1.24)
GFR, Estimated: >60 mL/min (ref >60)
Glucose, Bld: 118 mg/dL — ABNORMAL HIGH (ref 70–99)
Potassium: 3.6 mmol/L (ref 3.5–5.1)
Sodium: 134 mmol/L — ABNORMAL LOW (ref 135–145)
Total Bilirubin: 0.7 mg/dL (ref 0.0–1.2)
Total Protein: 6.7 g/dL (ref 6.5–8.1)

## 2023-04-21 LAB — MAGNESIUM: Magnesium: 1.5 mg/dL — ABNORMAL LOW (ref 1.7–2.4)

## 2023-04-21 MED ORDER — ZOLEDRONIC ACID 4 MG/100ML IV SOLN
4.0000 mg | Freq: Once | INTRAVENOUS | Status: AC
  Administered 2023-04-21: 4 mg via INTRAVENOUS
  Filled 2023-04-21: qty 100

## 2023-04-21 MED ORDER — SODIUM CHLORIDE 0.9 % IV SOLN
25.0000 mg/m2 | Freq: Once | INTRAVENOUS | Status: AC
  Administered 2023-04-21: 46 mg via INTRAVENOUS
  Filled 2023-04-21: qty 4.6

## 2023-04-21 MED ORDER — MAGNESIUM SULFATE 2 GM/50ML IV SOLN
2.0000 g | Freq: Once | INTRAVENOUS | Status: AC
  Administered 2023-04-21: 2 g via INTRAVENOUS
  Filled 2023-04-21: qty 50

## 2023-04-21 MED ORDER — SODIUM CHLORIDE 0.9 % IV SOLN
INTRAVENOUS | Status: DC
Start: 2023-04-21 — End: 2023-04-21

## 2023-04-21 MED ORDER — HEPARIN SOD (PORK) LOCK FLUSH 100 UNIT/ML IV SOLN
500.0000 [IU] | Freq: Once | INTRAVENOUS | Status: AC | PRN
  Administered 2023-04-21: 500 [IU]

## 2023-04-21 MED ORDER — SODIUM CHLORIDE 0.9% FLUSH
10.0000 mL | INTRAVENOUS | Status: DC | PRN
  Administered 2023-04-21: 10 mL

## 2023-04-21 MED ORDER — SODIUM CHLORIDE 0.9% FLUSH
10.0000 mL | INTRAVENOUS | Status: DC | PRN
  Administered 2023-04-21: 10 mL via INTRAVENOUS

## 2023-04-21 MED ORDER — DEXAMETHASONE SODIUM PHOSPHATE 10 MG/ML IJ SOLN
10.0000 mg | Freq: Once | INTRAMUSCULAR | Status: AC
  Administered 2023-04-21: 10 mg via INTRAVENOUS
  Filled 2023-04-21: qty 1

## 2023-04-21 MED ORDER — POTASSIUM CHLORIDE IN NACL 20-0.9 MEQ/L-% IV SOLN
Freq: Once | INTRAVENOUS | Status: AC
Start: 2023-04-21 — End: 2023-04-21
  Filled 2023-04-21: qty 1000

## 2023-04-21 MED ORDER — ONDANSETRON HCL 4 MG/2ML IJ SOLN
8.0000 mg | Freq: Once | INTRAMUSCULAR | Status: AC
  Administered 2023-04-21: 8 mg via INTRAVENOUS
  Filled 2023-04-21: qty 4

## 2023-04-21 NOTE — Progress Notes (Signed)
 Patient has been examined by Dr. Ellin Saba. Vital signs and labs have been reviewed by MD - ANC, Creatinine, LFTs, hemoglobin, and platelets are within treatment parameters per M.D. - pt may proceed with treatment.  Primary RN and pharmacy notified.

## 2023-04-21 NOTE — Patient Instructions (Signed)
 [facility] at [facility] Discharge Instructions   You were seen and examined today by Dr. Ellin Saba.  He reviewed the results of your lab work which are mostly normal/stable. Your calcium is high and your magnesium is low. We will correct this in the clinic today.   We will proceed with your treatment today.   Return as scheduled.    Thank you for choosing [facility] at [facility] to provide your oncology and hematology care.  To afford each patient quality time with our provider, please arrive at least 15 minutes before your scheduled appointment time.   If you have a lab appointment with the Cancer Center please come in thru the Main Entrance and check in at the main information desk.  You need to re-schedule your appointment should you arrive 10 or more minutes late.  We strive to give you quality time with our providers, and arriving late affects you and other patients whose appointments are after yours.  Also, if you no show three or more times for appointments you may be dismissed from the clinic at the providers discretion.     Again, thank you for choosing [facility].  Our hope is that these requests will decrease the amount of time that you wait before being seen by our physicians.       _____________________________________________________________  Should you have questions after your visit to [facility], please contact our office at [phone] and follow the prompts.  Our office hours are 8:00 a.m. and 4:30 p.m. Monday - Friday.  Please note that voicemails left after 4:00 p.m. may not be returned until the following business day.  We are closed weekends and major holidays.  You do have access to a nurse 24-7, just call the main number to the clinic [phone] and do not press any options, hold on the line and a nurse will answer the phone.    For prescription refill requests, have your pharmacy  contact our office and allow 72 hours.    Due to Covid, you will need to wear a mask upon entering the hospital. If you do not have a mask, a mask will be given to you at the Main Entrance upon arrival. For doctor visits, patients may have 1 support person age 35 or older with them. For treatment visits, patients can not have anyone with them due to social distancing guidelines and our immunocompromised population.

## 2023-04-21 NOTE — Progress Notes (Signed)
 Patient okay for treatment today with additional orders received for house fluids and Zometa per Dr. Ellin Saba. Patient tolerated chemotherapy with no complaints voiced. Side effects with management reviewed understanding verbalized. Port site clean and dry with no bruising or swelling noted at site. Good blood return noted before and after administration of chemotherapy. Band aid applied. Patient left in satisfactory condition with VSS and no s/s of distress noted.

## 2023-04-21 NOTE — Patient Instructions (Signed)
 [facility] [place] - A DEPT OF [facility]  Discharge Instructions: Thank you for choosing [facility] to provide your oncology and hematology care.  If you have a lab appointment with the Cancer Center - please note that after April 8th, 2024, all labs will be drawn in the cancer center.  You do not have to check in or register with the main entrance as you have in the past but will complete your check-in in the cancer center.  Wear comfortable clothing and clothing appropriate for easy access to any Portacath or PICC line.   We strive to give you quality time with your provider. You may need to reschedule your appointment if you arrive late (15 or more minutes).  Arriving late affects you and other patients whose appointments are after yours.  Also, if you miss three or more appointments without notifying the office, you may be dismissed from the clinic at the provider's discretion.      For prescription refill requests, have your pharmacy contact our office and allow 72 hours for refills to be completed.    Today you received the following chemotherapy and/or immunotherapy agents Taxotere, House fluids, and Zometa, return as scheduled.   To help prevent nausea and vomiting after your treatment, we encourage you to take your nausea medication as directed.  BELOW ARE SYMPTOMS THAT SHOULD BE REPORTED IMMEDIATELY: *FEVER GREATER THAN 100.4 F (38 C) OR HIGHER *CHILLS OR SWEATING *NAUSEA AND VOMITING THAT IS NOT CONTROLLED WITH YOUR NAUSEA MEDICATION *UNUSUAL SHORTNESS OF BREATH *UNUSUAL BRUISING OR BLEEDING *URINARY PROBLEMS (pain or burning when urinating, or frequent urination) *BOWEL PROBLEMS (unusual diarrhea, constipation, pain near the anus) TENDERNESS IN MOUTH AND THROAT WITH OR WITHOUT PRESENCE OF ULCERS (sore throat, sores in mouth, or a toothache) UNUSUAL RASH, SWELLING OR PAIN  UNUSUAL VAGINAL DISCHARGE OR ITCHING   Items with * indicate a  potential emergency and should be followed up as soon as possible or go to the Emergency Department if any problems should occur.  Please show the CHEMOTHERAPY ALERT CARD or IMMUNOTHERAPY ALERT CARD at check-in to the Emergency Department and triage nurse.  Should you have questions after your visit or need to cancel or reschedule your appointment, please contact [facility]  - A DEPT OF [facility] [phone]  and follow the prompts.  Office hours are 8:00 a.m. to 4:30 p.m. Monday - Friday. Please note that voicemails left after 4:00 p.m. may not be returned until the following business day.  We are closed weekends and major holidays. You have access to a nurse at all times for urgent questions. Please call the main number to the clinic [phone] and follow the prompts.  For any non-urgent questions, you may also contact your provider using MyChart. We now offer e-Visits for anyone 55 and older to request care online for non-urgent symptoms. For details visit [email or web address].   Also download the MyChart app! Go to the app store, search "MyChart", open the app, select [place], and log in with your MyChart username and password.

## 2023-04-26 ENCOUNTER — Telehealth: Payer: Self-pay | Admitting: Dietician

## 2023-04-26 ENCOUNTER — Inpatient Hospital Stay: Admitting: Dietician

## 2023-04-26 ENCOUNTER — Encounter: Payer: Self-pay | Admitting: Hematology

## 2023-04-26 NOTE — Telephone Encounter (Signed)
 Attempted to contact patient for scheduled nutrition telephone visit. VM not set up on mobile number. No answer on home telephone number.   Will continue efforts to contact patient as able.

## 2023-04-28 ENCOUNTER — Inpatient Hospital Stay

## 2023-04-28 ENCOUNTER — Inpatient Hospital Stay: Admitting: Hematology

## 2023-04-28 DIAGNOSIS — R21 Rash and other nonspecific skin eruption: Secondary | ICD-10-CM | POA: Diagnosis not present

## 2023-04-28 DIAGNOSIS — Z87891 Personal history of nicotine dependence: Secondary | ICD-10-CM | POA: Diagnosis not present

## 2023-04-28 DIAGNOSIS — Z95828 Presence of other vascular implants and grafts: Secondary | ICD-10-CM

## 2023-04-28 DIAGNOSIS — C049 Malignant neoplasm of floor of mouth, unspecified: Secondary | ICD-10-CM

## 2023-04-28 DIAGNOSIS — C770 Secondary and unspecified malignant neoplasm of lymph nodes of head, face and neck: Secondary | ICD-10-CM | POA: Diagnosis not present

## 2023-04-28 DIAGNOSIS — Z5111 Encounter for antineoplastic chemotherapy: Secondary | ICD-10-CM | POA: Diagnosis not present

## 2023-04-28 DIAGNOSIS — C787 Secondary malignant neoplasm of liver and intrahepatic bile duct: Secondary | ICD-10-CM | POA: Diagnosis not present

## 2023-04-28 DIAGNOSIS — I959 Hypotension, unspecified: Secondary | ICD-10-CM | POA: Diagnosis not present

## 2023-04-28 LAB — CBC WITH DIFFERENTIAL/PLATELET
Abs Immature Granulocytes: 0.04 10*3/uL (ref 0.00–0.07)
Basophils Absolute: 0 10*3/uL (ref 0.0–0.1)
Basophils Relative: 1 %
Eosinophils Absolute: 0 10*3/uL (ref 0.0–0.5)
Eosinophils Relative: 0 %
HCT: 33.3 % — ABNORMAL LOW (ref 39.0–52.0)
Hemoglobin: 10.8 g/dL — ABNORMAL LOW (ref 13.0–17.0)
Immature Granulocytes: 1 %
Lymphocytes Relative: 12 %
Lymphs Abs: 0.5 10*3/uL — ABNORMAL LOW (ref 0.7–4.0)
MCH: 32.4 pg (ref 26.0–34.0)
MCHC: 32.4 g/dL (ref 30.0–36.0)
MCV: 100 fL (ref 80.0–100.0)
Monocytes Absolute: 0.6 10*3/uL (ref 0.1–1.0)
Monocytes Relative: 14 %
Neutro Abs: 3.2 10*3/uL (ref 1.7–7.7)
Neutrophils Relative %: 72 %
Platelets: 258 10*3/uL (ref 150–400)
RBC: 3.33 MIL/uL — ABNORMAL LOW (ref 4.22–5.81)
RDW: 13.8 % (ref 11.5–15.5)
WBC: 4.4 10*3/uL (ref 4.0–10.5)
nRBC: 0 % (ref 0.0–0.2)

## 2023-04-28 LAB — COMPREHENSIVE METABOLIC PANEL WITH GFR
ALT: 27 U/L (ref 0–44)
AST: 39 U/L (ref 15–41)
Albumin: 2.9 g/dL — ABNORMAL LOW (ref 3.5–5.0)
Alkaline Phosphatase: 56 U/L (ref 38–126)
Anion gap: 14 (ref 5–15)
BUN: 12 mg/dL (ref 8–23)
CO2: 21 mmol/L — ABNORMAL LOW (ref 22–32)
Calcium: 9.3 mg/dL (ref 8.9–10.3)
Chloride: 99 mmol/L (ref 98–111)
Creatinine, Ser: 0.83 mg/dL (ref 0.61–1.24)
GFR, Estimated: >60 mL/min (ref >60)
Glucose, Bld: 121 mg/dL — ABNORMAL HIGH (ref 70–99)
Potassium: 3.8 mmol/L (ref 3.5–5.1)
Sodium: 134 mmol/L — ABNORMAL LOW (ref 135–145)
Total Bilirubin: 0.8 mg/dL (ref 0.0–1.2)
Total Protein: 6.9 g/dL (ref 6.5–8.1)

## 2023-04-28 LAB — MAGNESIUM: Magnesium: 1.7 mg/dL (ref 1.7–2.4)

## 2023-04-28 MED ORDER — MAGNESIUM SULFATE 2 GM/50ML IV SOLN
2.0000 g | Freq: Once | INTRAVENOUS | Status: AC
  Administered 2023-04-28: 2 g via INTRAVENOUS
  Filled 2023-04-28: qty 50

## 2023-04-28 MED ORDER — POTASSIUM CHLORIDE IN NACL 20-0.9 MEQ/L-% IV SOLN
Freq: Once | INTRAVENOUS | Status: AC
  Filled 2023-04-28: qty 1000

## 2023-04-28 MED ORDER — SODIUM CHLORIDE 0.9% FLUSH
10.0000 mL | INTRAVENOUS | Status: DC | PRN
  Administered 2023-04-28: 10 mL via INTRAVENOUS

## 2023-04-28 MED ORDER — POTASSIUM CHLORIDE IN NACL 20-0.45 MEQ/L-% IV SOLN: Freq: Once | INTRAVENOUS | Status: DC

## 2023-04-28 MED ORDER — HEPARIN SOD (PORK) LOCK FLUSH 100 UNIT/ML IV SOLN
500.0000 [IU] | Freq: Once | INTRAVENOUS | Status: AC
  Administered 2023-04-28: 500 [IU] via INTRAVENOUS

## 2023-04-28 MED ORDER — SODIUM CHLORIDE FLUSH 0.9 % IV SOLN
10.0000 mL | Freq: Once | INTRAVENOUS | Status: AC
Start: 2023-04-28 — End: 2023-04-28
  Administered 2023-04-28: 10 mL via INTRAVENOUS
  Filled 2023-04-28: qty 10

## 2023-04-28 NOTE — Patient Instructions (Signed)
 [facility] [place] - A DEPT OF [place] [facility]  Discharge Instructions: Thank you for choosing [facility] to provide your oncology and hematology care.  If you have a lab appointment with the Cancer Center - please note that after April 8th, 2024, all labs will be drawn in the cancer center.  You do not have to check in or register with the main entrance as you have in the past but will complete your check-in in the cancer center.  Wear comfortable clothing and clothing appropriate for easy access to any Portacath or PICC line.   We strive to give you quality time with your provider. You may need to reschedule your appointment if you arrive late (15 or more minutes).  Arriving late affects you and other patients whose appointments are after yours.  Also, if you miss three or more appointments without notifying the office, you may be dismissed from the clinic at the provider's discretion.      For prescription refill requests, have your pharmacy contact our office and allow 72 hours for refills to be completed.    Today you received house fluids    BELOW ARE SYMPTOMS THAT SHOULD BE REPORTED IMMEDIATELY: *FEVER GREATER THAN 100.4 F (38 C) OR HIGHER *CHILLS OR SWEATING *NAUSEA AND VOMITING THAT IS NOT CONTROLLED WITH YOUR NAUSEA MEDICATION *UNUSUAL SHORTNESS OF BREATH *UNUSUAL BRUISING OR BLEEDING *URINARY PROBLEMS (pain or burning when urinating, or frequent urination) *BOWEL PROBLEMS (unusual diarrhea, constipation, pain near the anus) TENDERNESS IN MOUTH AND THROAT WITH OR WITHOUT PRESENCE OF ULCERS (sore throat, sores in mouth, or a toothache) UNUSUAL RASH, SWELLING OR PAIN  UNUSUAL VAGINAL DISCHARGE OR ITCHING   Items with * indicate a potential emergency and should be followed up as soon as possible or go to the Emergency Department if any problems should occur.  Please show the CHEMOTHERAPY ALERT CARD or IMMUNOTHERAPY ALERT CARD at check-in to the  Emergency Department and triage nurse.  Should you have questions after your visit or need to cancel or reschedule your appointment, please contact [facility] [place] - A DEPT OF [facility] [phone]  and follow the prompts.  Office hours are 8:00 a.m. to 4:30 p.m. Monday - Friday. Please note that voicemails left after 4:00 p.m. may not be returned until the following business day.  We are closed weekends and major holidays. You have access to a nurse at all times for urgent questions. Please call the main number to the clinic [phone] and follow the prompts.  For any non-urgent questions, you may also contact your provider using MyChart. We now offer e-Visits for anyone 22 and older to request care online for non-urgent symptoms. For details visit [email or web address].   Also download the MyChart app! Go to the app store, search "MyChart", open the app, select [place], and log in with your MyChart username and password.

## 2023-04-28 NOTE — Progress Notes (Signed)
 Patient presents today for Docetaxel per provider's orders. Vital signs and labs are all WNL. Patient complaints of weakness, fatigue, decreased appetite, and shortness of breath at times. Dr.Katragadda made aware and stated patient could hold treatment this week and receive house fluids. Patient agreeable with holding treatment and receiving fluids. Patient also complained of worsening rash on his face that does not itch. Patient reports using Clindamycin cream and taking Doxycycline as directed. Patient advised to continue taking rash medication as prescribed.   Discharged from clinic via wheelchair in stable condition. Alert and oriented x 3. F/U with [facility] as scheduled.

## 2023-04-29 ENCOUNTER — Telehealth: Payer: Self-pay | Admitting: Dietician

## 2023-04-29 ENCOUNTER — Inpatient Hospital Stay: Admitting: Dietician

## 2023-04-29 ENCOUNTER — Other Ambulatory Visit: Payer: Self-pay | Admitting: *Deleted

## 2023-04-29 MED ORDER — MEGESTROL ACETATE 400 MG/10ML PO SUSP: 400.0000 mg | Freq: Two times a day (BID) | ORAL | 3 refills | Status: DC

## 2023-04-29 NOTE — Telephone Encounter (Signed)
 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 S/p resection, tracheostomy, bilateral neck dissection under the care of Dr. Tellis Feathers (10/14/21). Pt currently receiving docetaxel q7d (start 4/2). Patient is under the care of Dr. Cheree Cords.  Past medical history includes seminoma s/p right radical orchiectomy (2014), PCM, BPH, vit D deficiency, HLD, HTN  Spoke with patient as well as sister via telephone. Patient reports loss of appetite. Food does not appeal to him. Per sister he has not eaten in 2 days. Pt is drinking 3-4 Ensure Original (220 kcal, 9g). Patient reports tolerating soft textures without difficulty. He receives puree meals from [employer] per sister. Patient denies nausea, vomiting, diarrhea, constipation. He is fatigued. States he wants to be able to eat.   Nutrition Focused Physical Exam: unable to complete (telephone visit)   Medications: azor, D3, Hycet, Mag-ox, pravastatin, prednisone, rapaflo, B12, xylocaine   Labs: Na 134, glucose 121, albumin 2.9   Anthropometrics:   Height: 5'11" Weight: 149 lb 11.1 oz  UBW: 155-160 lb  BMI: 20.88   NUTRITION DIAGNOSIS: Inadequate oral intake related to recurrent BOT s/p resection and side effects of therapy as evidenced by ~7% decrease from 160 lb 6 oz on 2/20   INTERVENTION:  Discussed strategies for poor appetite, encourage bites/sips q2-3h of soft smooth textures Continue drinking 4 Ensure, recommend switching to Ensure Complete/equivalent for added calories/protein - will leave samples for pt to p/u 4/23 (pt aware) Discussed appetite stimulant with MD, will trial Megace - RD called sister to update    MONITORING, EVALUATION, GOAL: Pt will tolerate increased calories and protein to minimize further wt loss    Next Visit: Thursday May 1 via telephone

## 2023-05-04 NOTE — Progress Notes (Signed)
 Nathaniel Hicks [address] [place] [address]    Clinic Day:  05/05/2023  Referring physician: Omie Bickers, MD  Patient Care Team: Nathaniel Bickers, MD as PCP - General (Internal Medicine) Nathaniel Quiver, MD as Consulting Physician (Urology) Nathaniel Boros, MD as Medical Oncologist (Medical Oncology) Nathaniel Knuckles, RN as Oncology Nurse Navigator (Medical Oncology)   ASSESSMENT & PLAN:   Assessment: 1.  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  Enlarged contralateral right submandibular node.  Enlargement of the left submandibular gland probably reactive.  No abscess. - Biopsy (07/18/2021) floor of the mouth: Invasive well to moderately differentiated keratinizing squamous cell carcinoma.  Tumor cells negative for p16. - 10/14/2021: Floor of the mouth resection, tracheostomy, bilateral selective neck dissections zones 1-3 by Dr. Tellis Hicks - Pathology: Invasive moderately differentiated keratinizing SCC, 4.1 cm, carcinoma invades for a depth of about 1.9 cm and involves saliva gland tissue.  Anterior, posterior, right, left resection margins are involved.  Deep resection margin is negative.  Metastatic carcinoma 1/3 level 1 lymph nodes.  3 level 2 and 3 level 3 lymph nodes negative for carcinoma.  0/10 lymph nodes involved in the left side neck zone 1, 2, 3 dissection.  ENE not identified.  LVI/perineural invasion not identified. - I have talked to pathologist.  Reexcision margins were considered negative.  He has a high risk feature which is T4 tumor. -  PET scan on 12/12/2021: Soft tissue thickening and calcification along the ventral aspect of the trachea with associated hypermetabolism corresponding to recent tracheostomy site.  7 mm right  paratracheal lymph node new with SUV 2.4.  Hypermetabolism along the dome of the right hepatic lobe with probable 2.4 cm low-attenuation lesion - Liver lesion biopsy (01/28/2022): Metastatic squamous cell carcinoma, keratinizing. - NGS testing: PD-L1 (22 C3): CPS: 100, CD274 (PD-L1) amplified, JAK2 amplified, PIK3CA pathogenic variant exon 10, T p53 pathogenic variant, MS-stable, TMB-low - Per Dr. Tellis Hicks, he also has local recurrence of disease in the floor of the mouth. - Keytruda  from 03/10/2022 through 08/24/2022 with progression - XRT to the floor of the mouth on 06/29/2022 - PET scan 09/10/2022: Liver lesion increased in size measures 3.1 x 2.9 cm, previously 2.6 x 1.7 cm.  Right cervical lymph node meta stasis, mildly progressive. - HER2 by IHC 0 on biopsy from 01/28/2022. - Weekly cetuximab  started on 10/07/2022, discontinued on 04/14/2023 due to progression - PET scan (12/24/2022): Hypermetabolic anterior floor of mouth mass stable.  Stable right level 1B nodal metastasis.  Hypermetabolic 4.6 cm right liver dome metastasis increased in size by 8 mm.  No new sites of hypermetabolic metastatic disease. - MRI liver (01/07/2023): Right hepatic liver lesion measures 4.8 x 3.3 cm.  No new hepatic lesions seen. - Weekly docetaxel  started on 04/14/2023   2.  Social/family history: - He lives in his apartment by himself and is independent of ADLs and IADLs.  He does not drive.  He worked as a Curator and several other jobs.  Quit smoking 1 month ago.  He smoked 1 pack/week for more than 50 years. - 1 brother had agent orange related cancer.  Another brother also had cancer, type unknown to the patient.   3.  Stage Ib pure seminoma: - Status post  right radical orchiectomy on 12/20/2012. - He was on close surveillance rather than adjuvant chemotherapy.    Plan: 1.  Stage IV (PT4PN1 M1) SCC of the floor of the mouth, p16 negative: - He received a week 1 of docetaxel  at 25 mg/m on 04/14/2023 and week 2 on  04/21/2023.  Week 3 on 04/29/2023 was held due to weakness and decrease in appetite. - Weakness and loss of appetite have improved. - Reviewed labs today: Albumin 2.6 and LFTs normal.  CBC grossly normal. - We discussed goals of care again.  If he has difficulty tolerating chemotherapy, I have recommended that we stop active therapy and enroll in hospice.  He promised me that he will let me know when it is time to quit. - Today we will proceed with week 3 of docetaxel  at 25 mg/m.  If tolerability continues to be an issue, we can switch docetaxel  to 2 weeks on/1 week off. - RTC 1 week for follow-up.   2.  Oral pain: - Continue liquid hydrocodone  twice daily as needed.  Pain is well-controlled.   3.  Cetuximab  skin rash: - Rash is getting better.  He may discontinue doxycycline  after rash resolves on the face.   4.  Weight loss: - He had decreased appetite since docetaxel  started.  Megace  was started on 05/03/2023 with improvement in appetite.   5.  Hypomagnesemia: - He is taking magnesium  2 tablets twice daily and has difficulty swallowing them.  Magnesium  is 1.8 today.  Will cut back on magnesium  to 1 tablet twice daily.   6.  Hypercalcemia: - Calcium is 9.6 with albumin 2.6.  No need for Zometa .    No orders of the defined types were placed in this encounter.     I,Nathaniel Hicks,acting as a Neurosurgeon for Nathaniel Boros, MD.,have documented all relevant documentation on the behalf of Nathaniel Boros, MD,as directed by  Nathaniel Boros, MD while in the presence of Nathaniel Boros, MD.   I, Nathaniel Boros MD, have reviewed the above documentation for accuracy and completeness, and I agree with the above.   Nathaniel Boros, MD   4/23/20259:18 AM  CHIEF COMPLAINT:   Diagnosis: squamous cell carcinoma of the floor of the mouth, history of testicular seminoma    Cancer Staging  Cancer of floor of mouth [facility]) Staging form: Oral Cavity, AJCC 8th Edition -  Clinical stage from 11/25/2021: Stage IVC (cT4a, cN1, pM1) - Signed by Nathaniel Boros, MD on 02/04/2022  Testicle cancer, right Staging form: Testis, AJCC 7th Edition - Clinical: Stage I (T2, N0, M0) - Signed by Doretta Gant, PA-C on 07/23/2013    Prior Therapy: 1. Pembrolizumab , 03/10/22 - 08/24/22 2. XRT to floor of mouth, 06/17/22 - 08/19/22 3. SBRT to liver lesion, 03/15/23 - 03/26/23 4. Cetuximab  weekly, 10/07/22 - 04/14/23  Current Therapy:  docetaxel  weekly    HISTORY OF PRESENT ILLNESS:   Oncology History  Testicle cancer, right  12/20/2012 Surgery   Right total orchiectomyt- 1. Testis, biopsy, right - SEMINOMA. PLEASE SEE COMMENT. 2. Testis, tumor, right - SEMINOMA, 7.5 CM. - ANGIOLYMPHATIC INVASION PRESENT. - RESECTION MARGINS, NEGATIVE FOR ATYPIA OR MALIGNANCY.   01/18/2013 PET scan   Status post right orchiectomy. No findings specific for metastatic disease. Small para-aortic nodes measuring up to 5 mm short axis, without convincing hypermetabolism. Given location, attention on follow-up is suggested.   04/17/2013 Imaging   CT CAP- No evidence of metastatic disease in the chest, abdomen or pelvis. Proximal LAD coronary artery  calcification.   07/20/2013 Imaging   CT abd/pelvis- No evidence of metastatic disease in the abdomen or pelvis.   10/24/2013 Imaging   CT abd/pelvis- No findings to suggest metastatic disease in the abdomen or pelvis   10/24/2013 Imaging   Chest xray- Probable COPD.  Negative for metastatic disease.   02/21/2014 Imaging   CT abd/pelvis- Stable abdominal pelvic CT status post right orchectomy. No evidence of adenopathy or other metastatic disease.   02/21/2014 Imaging   Chest xray- Left lower lobe mild atelectasis and/or infiltrate.     09/10/2014 Imaging   CT abd/pelvis- Status post right orchiectomy.   No evidence of metastatic disease.   3 mm nonobstructing right upper pole renal calculus. No hydronephrosis.   03/14/2015 Imaging   CT abd/pelvis-  Stable exam. No evidence of metastatic disease or other acute findings within the abdomen or pelvis.   09/13/2015 Imaging   CT abd/pelvis- No acute findings and no evidence for mass or adenopathy.    04/24/2016 Imaging   CT abd/pelvis: IMPRESSION: 1. Stable exam. No new or progressive findings. No features to suggest metastatic disease.   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HCC)  10/14/2021 Initial Diagnosis   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HCC)   11/25/2021 Cancer Staging   Staging form: Oral Cavity, AJCC 8th Edition - Clinical stage from 11/25/2021: Stage IVC (cT4a, cN1, pM1) - Signed by Nathaniel Boros, MD on 02/04/2022 Histopathologic type: Squamous cell carcinoma, NOS Stage prefix: Initial diagnosis Histologic grade (G): G2 Histologic grading system: 3 grade system   03/10/2022 - 08/24/2022 Chemotherapy   Patient is on Treatment Plan : HEAD/NECK Pembrolizumab  (200) q21d     10/07/2022 - 03/31/2023 Chemotherapy   Patient is on Treatment Plan : HEAD/NECK Cetuximab  q7d     04/14/2023 -  Chemotherapy   Patient is on Treatment Plan : Head and Neck Docetaxel  (35) q7d        INTERVAL HISTORY:   Nathaniel Hicks is a 83 y.o. male presenting to clinic today for follow up of squamous cell carcinoma of the floor of the mouth, history of testicular seminoma. He was last seen by me on 04/21/23.  Today, he states that he is doing well overall. His appetite level is at 75%. His energy level is at 25%.  PAST MEDICAL HISTORY:   Past Medical History: Past Medical History:  Diagnosis Date   Arthritis    BPH (benign prostatic hyperplasia)    Difficult intubation    HOH (hard of hearing)    Hyperlipidemia 11/02/2017   Hypertension    Hypertension 11/02/2017   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HCC)    2014   Vitamin D deficiency 11/02/2017    Surgical History: Past Surgical History:  Procedure Laterality Date   COLONOSCOPY N/A 11/24/2012   Procedure: COLONOSCOPY;  Surgeon: Ruby Corporal, MD;  Location: [facility];   Service: Endoscopy;  Laterality: N/A;  830-moved to 730 Ann notified pt   FLOOR OF MOUTH BIOPSY N/A 10/14/2021   Procedure: FLOOR OF MOUTH RESECTION;  Surgeon: Virgina Grills, MD;  Location: [facility] OR;  Service: ENT;  Laterality: N/A;   HEMORROIDECTOMY     KNEE ARTHROSCOPY WITH LATERAL MENISECTOMY Right 08/31/2017   Procedure: KNEE ARTHROSCOPY WITH LATERAL MENISECTOMY;  Surgeon: Darrin Emerald, MD;  Location: [facility] ORS;  Service: Orthopedics;  Laterality: Right;   LESION EXCISION N/A 03/23/2012   Procedure: EXCISION NEOPLASM SCALP ;  Surgeon: Beau Bound, MD;  Location: [facility] OR[facility];  Service: General;  Laterality: N/A;  Excision of Scalp Neoplasm   ORCHIECTOMY Right 12/20/2012   Procedure: RIGHT RADICAL ORCHIECTOMY/POSSIBLE BX RIGHT TESTICLE;  Surgeon: Reggie Caper, MD;  Location: [facility];  Service: Urology;  Laterality: Right;   PORTACATH PLACEMENT Left 03/25/2022   Procedure: INSERTION PORT-A-CATH;  Surgeon: Alanda Allegra, MD;  Location: [facility];  Service: General;  Laterality: Left;   PROSTATE SURGERY     RADICAL NECK DISSECTION Bilateral 10/14/2021   Procedure: NECK DISSECTION;  Surgeon: Virgina Grills, MD;  Location: [facility] OR;  Service: ENT;  Laterality: Bilateral;   SCALP LACERATION REPAIR     [facility]Dr Felipe Horton   SKIN FULL THICKNESS GRAFT Bilateral 10/14/2021   Procedure: PLATYSMA FLAP CLOSURE;  Surgeon: Virgina Grills, MD;  Location[facility];  Service: ENT;  Laterality: Bilateral;   TOOTH EXTRACTION  10/14/2021   Procedure: DENTAL EXTRACTIONS;  Surgeon: Virgina Grills, MD;  [facility];  Service: ENT;;   TRACHEOSTOMY TUBE PLACEMENT N/A 10/14/2021   Procedure: TRACHEOSTOMY;  Surgeon: Virgina Grills, MD;  Location: [facility] OR;  Service: ENT;  Laterality: N/A;    Social History: Social History   Socioeconomic History   Marital status: Single    Spouse name: Not on file   Number of children: Not on file   Years of education: Not on file   Highest education level: Not on file  Occupational History    Not on file  Tobacco Use   Smoking status: Former    Current packs/day: 0.25    Average packs/day: 0.3 packs/day for 50.0 years (12.5 ttl pk-yrs)    Types: Cigarettes   Smokeless tobacco: Never  Vaping Use   Vaping status: Never Used  Substance and Sexual Activity   Alcohol use: Not Currently   Drug use: No   Sexual activity: Yes    Birth control/protection: None  Other Topics Concern   Not on file  Social History Narrative   Not on file   Social Drivers of Health   Financial Resource Strain: Not on file  Food Insecurity: Not on file  Transportation Needs: Not on file  Physical Activity: Not on file  Stress: Not on file  Social Connections: Not on file  Intimate Partner Violence: Not on file    Family History: Family History  Problem Relation Age of Onset   Cancer Brother    Cancer Brother     Current Medications:  Current Outpatient Medications:    acetaminophen  (TYLENOL ) 650 MG CR tablet, Take 650 mg by mouth every 8 (eight) hours as needed for pain., Disp: , Rfl:    amLODipine -olmesartan  (AZOR ) 5-20 MG tablet, TAKE 1 TABLET BY MOUTH DAILY, Disp: 90 tablet, Rfl: 0   budesonide -formoterol  (SYMBICORT ) 80-4.5 MCG/ACT inhaler, Inhale 2 puffs into the lungs in the morning and at bedtime., Disp: 1 each, Rfl: 12   chlorhexidine  (PERIDEX ) 0.12 % solution, Use as directed 5 mLs in the mouth or throat 2 (two) times daily., Disp: , Rfl:    Cholecalciferol (VITAMIN D3) 50 MCG (2000 UT) TABS, Take 2,000 Units by mouth daily., Disp: , Rfl:    ciprofloxacin-dexamethasone  (CIPRODEX) OTIC suspension, Place 4 drops into the left ear 2 (two) times daily., Disp: , Rfl:    clindamycin  (CLINDAGEL) 1 % gel, Apply topically 2 (two) times daily. Apply twice daily to chest rash, Disp: 30 g, Rfl: 0   doxycycline  (VIBRA -TABS) 100 MG tablet, TAKE 1 TABLET(100 MG) BY MOUTH TWICE DAILY, Disp: 60 tablet, Rfl: 3   HYDROcodone -acetaminophen  (HYCET) 7.5-325 mg/15 ml solution, Take 15  mLs by mouth  every 6 (six) hours as needed for moderate pain (pain score 4-6)., Disp: 473 mL, Rfl: 0   lidocaine  (XYLOCAINE ) 2 % solution, Take by mouth., Disp: , Rfl:    magnesium  oxide (MAG-OX) 400 MG tablet, Take 2 tablets (800 mg total) by mouth 2 (two) times daily., Disp: 120 tablet, Rfl: 3   megestrol  (MEGACE ) 400 MG/10ML suspension, Take 10 mLs (400 mg total) by mouth 2 (two) times daily., Disp: 480 mL, Rfl: 3   Menthol, Topical Analgesic, (BIOFREEZE EX), Apply 1 application  topically daily as needed (pain)., Disp: , Rfl:    naloxone  (NARCAN ) nasal spray 4 mg/0.1 mL, SMARTSIG:Both Nares, Disp: , Rfl:    Polyethyl Glycol-Propyl Glycol (GOODSENSE LUBRICANT EYE DROPS OP), Place 1 drop into both eyes daily as needed (dry eyes)., Disp: , Rfl:    pravastatin  (PRAVACHOL ) 80 MG tablet, Take 80 mg by mouth daily., Disp: , Rfl:    RAPAFLO 8 MG CAPS capsule, Take 8 mg by mouth daily. Silodosin, Disp: , Rfl:    vitamin B-12 (CYANOCOBALAMIN ) 500 MCG tablet, Take 500 mcg by mouth daily., Disp: , Rfl:  No current facility-administered medications for this visit.  Facility-Administered Medications Ordered in Other Visits:    0.9 %  sodium chloride  infusion, , Intravenous, Continuous, Daryn Pisani, MD   dexamethasone  (DECADRON ) injection 10 mg, 10 mg, Intravenous, Once, Laurette Villescas, MD   DOCEtaxel  (TAXOTERE ) 46 mg in sodium chloride  0.9 % 150 mL chemo infusion, 25 mg/m2 (Treatment Plan Recorded), Intravenous, Once, Nathaniel Boros, MD   heparin  lock flush 100 unit/mL, 500 Units, Intracatheter, Once PRN, Astaria Nanez, MD   ondansetron  (ZOFRAN ) injection 8 mg, 8 mg, Intravenous, Once, Artemisia Auvil, MD   sodium chloride  flush (NS) 0.9 % injection 10 mL, 10 mL, Intracatheter, PRN, Orman Matsumura, MD   Allergies: No Known Allergies  REVIEW OF SYSTEMS:   Review of Systems  Constitutional:  Negative for chills, fatigue and fever.  HENT:   Positive for trouble swallowing.  Negative for lump/mass, mouth sores, nosebleeds and sore throat.   Eyes:  Negative for eye problems.  Respiratory:  Negative for cough and shortness of breath.   Cardiovascular:  Negative for chest pain, leg swelling and palpitations.  Gastrointestinal:  Positive for diarrhea. Negative for abdominal pain, constipation, nausea and vomiting.  Genitourinary:  Negative for bladder incontinence, difficulty urinating, dysuria, frequency, hematuria and nocturia.   Musculoskeletal:  Negative for arthralgias, back pain, flank pain, myalgias and neck pain.  Skin:  Negative for itching and rash.  Neurological:  Negative for dizziness, headaches and numbness.  Hematological:  Does not bruise/bleed easily.  Psychiatric/Behavioral:  Negative for depression, sleep disturbance and suicidal ideas. The patient is not nervous/anxious.   All other systems reviewed and are negative.    VITALS:   Weight 143 lb 4.8 oz (65 kg).  Wt Readings from Last 3 Encounters:  05/05/23 143 lb 4.8 oz (65 kg)  04/28/23 149 lb 11.1 oz (67.9 kg)  04/21/23 143 lb 11.8 oz (65.2 kg)    Body mass index is 19.99 kg/m.  Performance status (ECOG): 1 - Symptomatic but completely ambulatory  PHYSICAL EXAM:   Physical Exam Vitals and nursing note reviewed. Exam conducted with a chaperone present.  Constitutional:      Appearance: Normal appearance.  Cardiovascular:     Rate and Rhythm: Normal rate and regular rhythm.     Pulses: Normal pulses.     Heart sounds: Normal heart sounds.  Pulmonary:  Effort: Pulmonary effort is normal.     Breath sounds: Normal breath sounds.  Abdominal:     Palpations: Abdomen is soft. There is no hepatomegaly, splenomegaly or mass.     Tenderness: There is no abdominal tenderness.  Musculoskeletal:     Right lower leg: No edema.     Left lower leg: No edema.  Lymphadenopathy:     Cervical: No cervical adenopathy.     Right cervical: No superficial, deep or posterior cervical  adenopathy.    Left cervical: No superficial, deep or posterior cervical adenopathy.     Upper Body:     Right upper body: No supraclavicular or axillary adenopathy.     Left upper body: No supraclavicular or axillary adenopathy.  Neurological:     General: No focal deficit present.     Mental Status: He is alert and oriented to person, place, and time.  Psychiatric:        Mood and Affect: Mood normal.        Behavior: Behavior normal.     LABS:   CBC     Component Value Date/Time   WBC 5.0 05/05/2023 0813   RBC 3.09 (L) 05/05/2023 0813   HGB 10.0 (L) 05/05/2023 0813   HCT 31.0 (L) 05/05/2023 0813   PLT 227 05/05/2023 0813   MCV 100.3 (H) 05/05/2023 0813   MCH 32.4 05/05/2023 0813   MCHC 32.3 05/05/2023 0813   RDW 14.6 05/05/2023 0813   LYMPHSABS 1.0 05/05/2023 0813   MONOABS 1.1 (H) 05/05/2023 0813   EOSABS 0.0 05/05/2023 0813   BASOSABS 0.1 05/05/2023 0813    CMP      Component Value Date/Time   NA 132 (L) 05/05/2023 0813   K 3.6 05/05/2023 0813   CL 100 05/05/2023 0813   CO2 25 05/05/2023 0813   GLUCOSE 96 05/05/2023 0813   BUN 9 05/05/2023 0813   CREATININE 0.66 05/05/2023 0813   CALCIUM 9.6 05/05/2023 0813   PROT 6.1 (L) 05/05/2023 0813   ALBUMIN 2.6 (L) 05/05/2023 0813   AST 24 05/05/2023 0813   ALT 20 05/05/2023 0813   ALKPHOS 53 05/05/2023 0813   BILITOT 0.5 05/05/2023 0813   GFRNONAA >60 05/05/2023 0813   GFRAA >60 10/26/2018 1137     Lab Results  Component Value Date   CEA 2.2 01/19/2013   /  CEA  Date Value Ref Range Status  01/19/2013 2.2 0.0 - 5.0 ng/mL Final    Comment:    Performed at [facility]   No results found for: "PSA1" No results found for: "CAN199" No results found for: "CAN125"  No results found for: "TOTALPROTELP", "ALBUMINELP", "A1GS", "A2GS", "BETS", "BETA2SER", "GAMS", "MSPIKE", "SPEI" Lab Results  Component Value Date   TIBC 244 (L) 08/04/2022   TIBC 251 05/12/2022   FERRITIN 273 08/04/2022   FERRITIN  195 05/12/2022   IRONPCTSAT 19 08/04/2022   IRONPCTSAT 18 05/12/2022   Lab Results  Component Value Date   LDH 172 11/12/2020   LDH 175 10/26/2018   LDH 179 10/25/2013     STUDIES:   NM PET Image Restag (PS) Skull Base To Thigh Result Date: 04/13/2023 CLINICAL DATA:  Subsequent treatment strategy for cancer of floor of mouth. Restaging. Radiation last week. EXAM: NUCLEAR MEDICINE PET SKULL BASE TO THIGH TECHNIQUE: 6.9 mCi F-18 FDG was injected intravenously. Full-ring PET imaging was performed from the skull base to thigh after the radiotracer. CT data was obtained and used for attenuation correction and  anatomic localization. Fasting blood glucose: 132 mg/dl COMPARISON:  14/78/2956 FINDINGS: Mediastinal blood pool activity: SUV max 2.3 Liver activity: SUV max NA NECK: Right-sided floor mild mass measures 4.2 x 4.0 cm and a S.U.V. max of 8.8 on 43/202. Compare 4.2 x 2.8 cm and a S.U.V. max of 10.4. Persistent anterior mandibular osseous destruction. Right-sided level 1 B node measures 2.3 cm and a S.U.V. max of 7.8 on 48/202 versus 1.1 cm and a S.U.V. max of 7.5 on the prior. Right-sided level 4 node measures 2.4 cm and a S.U.V. max of 10.3 today versus 1.3 cm and a S.U.V. max of 9.4 on the prior. Incidental CT findings: Bilateral carotid atherosclerosis. Cerebral atrophy. CHEST: No pulmonary parenchymal or thoracic nodal hypermetabolism. Incidental CT findings: Left Port-A-Cath tip low SVC. Mild cardiomegaly. Aortic and coronary artery calcification. Centrilobular emphysema. Bibasilar scarring. ABDOMEN/PELVIS: High right hepatic lobe mass measures 8.3 x 5.5 cm and a S.U.V. max of 9.3 on 98/202 versus 4.6 by 3.3 cm and a S.U.V. max of 8.9. No abdominopelvic nodal hypermetabolism. Incidental CT findings: Normal adrenal glands. Lower pole right renal punctate collecting system calculus. Subcentimeter interpolar left renal cyst . In the absence of clinically indicated signs/symptoms require(s) no  independent follow-up. Moderate prostatomegaly. SKELETON: No abnormal marrow hypermetabolism. Subtle focus of hypermetabolism within the right psoas posteriorly may correspond to subtle soft tissue fullness on 138/202. This measures a S.U.V. max of 1.9 today versus a S.U.V. max of 1.4 on the prior. Incidental CT findings: Degenerative changes of both hips. IMPRESSION: 1. Mild progression of floor mild primary and right-sided cervical nodal metastasis. 2. Moderate progression of isolated hepatic metastasis. 3. Similar subtle activity within the posterior right psoas muscle with possible correlate soft tissue fullness. Considerations include sequelae of prior trauma or an atypical site of metastasis. 4. Incidental findings, including: Coronary artery atherosclerosis. Aortic Atherosclerosis (ICD10-I70.0). Emphysema (ICD10-J43.9). Right nephrolithiasis. Prostatomegaly. Electronically Signed   By: Lore Rode M.D.   On: 04/13/2023 10:27

## 2023-05-05 ENCOUNTER — Inpatient Hospital Stay

## 2023-05-05 ENCOUNTER — Inpatient Hospital Stay (HOSPITAL_BASED_OUTPATIENT_CLINIC_OR_DEPARTMENT_OTHER): Admitting: Hematology

## 2023-05-05 VITALS — BP 114/64 | HR 84 | Temp 97.2°F | Resp 18

## 2023-05-05 VITALS — Wt 143.3 lb

## 2023-05-05 VITALS — BP 124/75 | HR 99 | Temp 97.6°F | Resp 17

## 2023-05-05 DIAGNOSIS — C049 Malignant neoplasm of floor of mouth, unspecified: Secondary | ICD-10-CM

## 2023-05-05 DIAGNOSIS — I959 Hypotension, unspecified: Secondary | ICD-10-CM | POA: Diagnosis not present

## 2023-05-05 DIAGNOSIS — C01 Malignant neoplasm of base of tongue: Secondary | ICD-10-CM | POA: Diagnosis not present

## 2023-05-05 DIAGNOSIS — C6211 Malignant neoplasm of descended right testis: Secondary | ICD-10-CM

## 2023-05-05 DIAGNOSIS — R21 Rash and other nonspecific skin eruption: Secondary | ICD-10-CM | POA: Diagnosis not present

## 2023-05-05 DIAGNOSIS — Z95828 Presence of other vascular implants and grafts: Secondary | ICD-10-CM

## 2023-05-05 DIAGNOSIS — C787 Secondary malignant neoplasm of liver and intrahepatic bile duct: Secondary | ICD-10-CM | POA: Diagnosis not present

## 2023-05-05 DIAGNOSIS — Z5111 Encounter for antineoplastic chemotherapy: Secondary | ICD-10-CM | POA: Diagnosis not present

## 2023-05-05 DIAGNOSIS — Z87891 Personal history of nicotine dependence: Secondary | ICD-10-CM | POA: Diagnosis not present

## 2023-05-05 DIAGNOSIS — C770 Secondary and unspecified malignant neoplasm of lymph nodes of head, face and neck: Secondary | ICD-10-CM | POA: Diagnosis not present

## 2023-05-05 LAB — CBC WITH DIFFERENTIAL/PLATELET
Abs Immature Granulocytes: 0.03 10*3/uL (ref 0.00–0.07)
Basophils Absolute: 0.1 10*3/uL (ref 0.0–0.1)
Basophils Relative: 1 %
Eosinophils Absolute: 0 10*3/uL (ref 0.0–0.5)
Eosinophils Relative: 0 %
HCT: 31 % — ABNORMAL LOW (ref 39.0–52.0)
Hemoglobin: 10 g/dL — ABNORMAL LOW (ref 13.0–17.0)
Immature Granulocytes: 1 %
Lymphocytes Relative: 20 %
Lymphs Abs: 1 10*3/uL (ref 0.7–4.0)
MCH: 32.4 pg (ref 26.0–34.0)
MCHC: 32.3 g/dL (ref 30.0–36.0)
MCV: 100.3 fL — ABNORMAL HIGH (ref 80.0–100.0)
Monocytes Absolute: 1.1 10*3/uL — ABNORMAL HIGH (ref 0.1–1.0)
Monocytes Relative: 22 %
Neutro Abs: 2.8 10*3/uL (ref 1.7–7.7)
Neutrophils Relative %: 56 %
Platelets: 227 10*3/uL (ref 150–400)
RBC: 3.09 MIL/uL — ABNORMAL LOW (ref 4.22–5.81)
RDW: 14.6 % (ref 11.5–15.5)
WBC: 5 10*3/uL (ref 4.0–10.5)
nRBC: 0 % (ref 0.0–0.2)

## 2023-05-05 LAB — COMPREHENSIVE METABOLIC PANEL WITH GFR
ALT: 20 U/L (ref 0–44)
AST: 24 U/L (ref 15–41)
Albumin: 2.6 g/dL — ABNORMAL LOW (ref 3.5–5.0)
Alkaline Phosphatase: 53 U/L (ref 38–126)
Anion gap: 7 (ref 5–15)
BUN: 9 mg/dL (ref 8–23)
CO2: 25 mmol/L (ref 22–32)
Calcium: 9.6 mg/dL (ref 8.9–10.3)
Chloride: 100 mmol/L (ref 98–111)
Creatinine, Ser: 0.66 mg/dL (ref 0.61–1.24)
GFR, Estimated: >60 mL/min (ref >60)
Glucose, Bld: 96 mg/dL (ref 70–99)
Potassium: 3.6 mmol/L (ref 3.5–5.1)
Sodium: 132 mmol/L — ABNORMAL LOW (ref 135–145)
Total Bilirubin: 0.5 mg/dL (ref 0.0–1.2)
Total Protein: 6.1 g/dL — ABNORMAL LOW (ref 6.5–8.1)

## 2023-05-05 LAB — MAGNESIUM: Magnesium: 1.8 mg/dL (ref 1.7–2.4)

## 2023-05-05 MED ORDER — DEXAMETHASONE SODIUM PHOSPHATE 10 MG/ML IJ SOLN
10.0000 mg | Freq: Once | INTRAMUSCULAR | Status: AC
  Administered 2023-05-05: 10 mg via INTRAVENOUS
  Filled 2023-05-05: qty 1

## 2023-05-05 MED ORDER — SODIUM CHLORIDE FLUSH 0.9 % IV SOLN
10.0000 mL | Freq: Once | INTRAVENOUS | Status: AC
  Administered 2023-05-05: 10 mL via INTRAVENOUS
  Filled 2023-05-05: qty 10

## 2023-05-05 MED ORDER — SODIUM CHLORIDE 0.9 % IV SOLN
25.0000 mg/m2 | Freq: Once | INTRAVENOUS | Status: AC
  Administered 2023-05-05: 46 mg via INTRAVENOUS
  Filled 2023-05-05: qty 4.6

## 2023-05-05 MED ORDER — HEPARIN SOD (PORK) LOCK FLUSH 100 UNIT/ML IV SOLN
500.0000 [IU] | Freq: Once | INTRAVENOUS | Status: AC | PRN
  Administered 2023-05-05: 500 [IU]

## 2023-05-05 MED ORDER — SODIUM CHLORIDE 0.9% FLUSH
10.0000 mL | INTRAVENOUS | Status: DC | PRN
  Administered 2023-05-05: 10 mL

## 2023-05-05 MED ORDER — SODIUM CHLORIDE 0.9 % IV SOLN: INTRAVENOUS | Status: DC

## 2023-05-05 MED ORDER — ONDANSETRON HCL 4 MG/2ML IJ SOLN
8.0000 mg | Freq: Once | INTRAMUSCULAR | Status: AC
  Administered 2023-05-05: 8 mg via INTRAVENOUS
  Filled 2023-05-05: qty 4

## 2023-05-05 NOTE — Patient Instructions (Signed)
 [facility] - A DEPT OF [facility]  Discharge Instructions: Thank you for choosing [facility] to provide your oncology and hematology care.  If you have a lab appointment with the Cancer Center - please note that after April 8th, 2024, all labs will be drawn in the cancer center.  You do not have to check in or register with the main entrance as you have in the past but will complete your check-in in the cancer center.  Wear comfortable clothing and clothing appropriate for easy access to any Portacath or PICC line.   We strive to give you quality time with your provider. You may need to reschedule your appointment if you arrive late (15 or more minutes).  Arriving late affects you and other patients whose appointments are after yours.  Also, if you miss three or more appointments without notifying the office, you may be dismissed from the clinic at the provider's discretion.      For prescription refill requests, have your pharmacy contact our office and allow 72 hours for refills to be completed.    Today you received the following chemotherapy and/or immunotherapy agents Taxotere   To help prevent nausea and vomiting after your treatment, we encourage you to take your nausea medication as directed.  BELOW ARE SYMPTOMS THAT SHOULD BE REPORTED IMMEDIATELY: *FEVER GREATER THAN 100.4 F (38 C) OR HIGHER *CHILLS OR SWEATING *NAUSEA AND VOMITING THAT IS NOT CONTROLLED WITH YOUR NAUSEA MEDICATION *UNUSUAL SHORTNESS OF BREATH *UNUSUAL BRUISING OR BLEEDING *URINARY PROBLEMS (pain or burning when urinating, or frequent urination) *BOWEL PROBLEMS (unusual diarrhea, constipation, pain near the anus) TENDERNESS IN MOUTH AND THROAT WITH OR WITHOUT PRESENCE OF ULCERS (sore throat, sores in mouth, or a toothache) UNUSUAL RASH, SWELLING OR PAIN  UNUSUAL VAGINAL DISCHARGE OR ITCHING   Items with * indicate a potential emergency and should be followed up as  soon as possible or go to the Emergency Department if any problems should occur.  Please show the CHEMOTHERAPY ALERT CARD or IMMUNOTHERAPY ALERT CARD at check-in to the Emergency Department and triage nurse.  Should you have questions after your visit or need to cancel or reschedule your appointment, please contact [facility] [place] - A DEPT OF [facility] [phone]  and follow the prompts.  Office hours are 8:00 a.m. to 4:30 p.m. Monday - Friday. Please note that voicemails left after 4:00 p.m. may not be returned until the following business day.  We are closed weekends and major holidays. You have access to a nurse at all times for urgent questions. Please call the main number to the clinic [phone] and follow the prompts.  For any non-urgent questions, you may also contact your provider using MyChart. We now offer e-Visits for anyone 16 and older to request care online for non-urgent symptoms. For details visit [email or web address].   Also download the MyChart app! Go to the app store, search "MyChart", open the app, select [facility], and log in with your MyChart username and password.

## 2023-05-05 NOTE — Patient Instructions (Addendum)
 [facility] at [facility] Discharge Instructions   You were seen and examined today by Dr. Cheree Cords.  He reviewed the results of your lab work which are normal/stable.   Once your rash clears up you may stop taking the doxycycline .   You can decrease the magnesium  to one pill twice a day.   We will proceed with your treatment today.   Return as scheduled.    Thank you for choosing [facility] at [facility] to provide your oncology and hematology care.  To afford each patient quality time with our provider, please arrive at least 15 minutes before your scheduled appointment time.   If you have a lab appointment with the Cancer Center please come in thru the Main Entrance and check in at the main information desk.  You need to re-schedule your appointment should you arrive 10 or more minutes late.  We strive to give you quality time with our providers, and arriving late affects you and other patients whose appointments are after yours.  Also, if you no show three or more times for appointments you may be dismissed from the clinic at the providers discretion.     Again, thank you for choosing [facility] [place].  Our hope is that these requests will decrease the amount of time that you wait before being seen by our physicians.       _____________________________________________________________  Should you have questions after your visit to [facility], please contact our office at [phone] and follow the prompts.  Our office hours are 8:00 a.m. and 4:30 p.m. Monday - Friday.  Please note that voicemails left after 4:00 p.m. may not be returned until the following business day.  We are closed weekends and major holidays.  You do have access to a nurse 24-7, just call the main number to the clinic [phone] and do not press any options, hold on the line and a nurse will answer the phone.    For prescription refill  requests, have your pharmacy contact our office and allow 72 hours.    Due to Covid, you will need to wear a mask upon entering the hospital. If you do not have a mask, a mask will be given to you at the Main Entrance upon arrival. For doctor visits, patients may have 1 support person age 73 or older with them. For treatment visits, patients can not have anyone with them due to social distancing guidelines and our immunocompromised population.

## 2023-05-05 NOTE — Progress Notes (Signed)
 Patient presents today for Taxotere infusion per providers order.  Vital signs and labs reviewed by MD.  Message received from Chapman Moss RN/Dr. Ellin Saba patient okay for treatment.  Treatment given today per MD orders.  Stable during infusion without adverse affects.  Vital signs stable.  No complaints at this time.  Discharge from clinic ambulatory in stable condition.  Alert and oriented X 3.  Follow up with [facility] as scheduled.

## 2023-05-06 ENCOUNTER — Other Ambulatory Visit: Payer: Self-pay | Admitting: *Deleted

## 2023-05-06 MED ORDER — HYDROCODONE-ACETAMINOPHEN 7.5-325 MG/15ML PO SOLN: 15.0000 mL | Freq: Four times a day (QID) | ORAL | 0 refills | Status: DC | PRN

## 2023-05-10 ENCOUNTER — Other Ambulatory Visit: Payer: Self-pay

## 2023-05-10 ENCOUNTER — Telehealth: Payer: Self-pay

## 2023-05-10 NOTE — Progress Notes (Signed)
   05/10/2023  Patient ID: Helen Loa, male   DOB: 1940-03-15, 83 y.o.   MRN: [id number]   Patient appeared on insurance report for not passing the quality metrics in 2024:  Medication Adherence for Cholesterol (MAC)   Outreach to the patient was not needed today.  Meds Tracking:  -Pravastatin  80 mg - Last fill 90DS on 02/18/23, LDL 63 on 11/18/22. Does not qualify for metric yet this year, next fill due 05/19/23.  Plan:  Patient up to date on meds and LDL at goal. No fills remaining on file, last PCP visit on 04/13/23, next on 07/30/23. Sent 100DS w/ 3 refills to pharmacy.   Flint Hummer, PharmD

## 2023-05-12 ENCOUNTER — Inpatient Hospital Stay

## 2023-05-12 ENCOUNTER — Inpatient Hospital Stay: Admitting: Hematology

## 2023-05-12 ENCOUNTER — Inpatient Hospital Stay (HOSPITAL_BASED_OUTPATIENT_CLINIC_OR_DEPARTMENT_OTHER): Admitting: Hematology

## 2023-05-12 VITALS — BP 99/59 | HR 104 | Wt 139.3 lb

## 2023-05-12 DIAGNOSIS — C770 Secondary and unspecified malignant neoplasm of lymph nodes of head, face and neck: Secondary | ICD-10-CM | POA: Diagnosis not present

## 2023-05-12 DIAGNOSIS — I959 Hypotension, unspecified: Secondary | ICD-10-CM | POA: Diagnosis not present

## 2023-05-12 DIAGNOSIS — C049 Malignant neoplasm of floor of mouth, unspecified: Secondary | ICD-10-CM

## 2023-05-12 DIAGNOSIS — Z5111 Encounter for antineoplastic chemotherapy: Secondary | ICD-10-CM | POA: Diagnosis not present

## 2023-05-12 DIAGNOSIS — R21 Rash and other nonspecific skin eruption: Secondary | ICD-10-CM | POA: Diagnosis not present

## 2023-05-12 DIAGNOSIS — Z87891 Personal history of nicotine dependence: Secondary | ICD-10-CM | POA: Diagnosis not present

## 2023-05-12 DIAGNOSIS — C787 Secondary malignant neoplasm of liver and intrahepatic bile duct: Secondary | ICD-10-CM | POA: Diagnosis not present

## 2023-05-12 LAB — COMPREHENSIVE METABOLIC PANEL WITH GFR
ALT: 27 U/L (ref 0–44)
AST: 30 U/L (ref 15–41)
Albumin: 2.5 g/dL — ABNORMAL LOW (ref 3.5–5.0)
Alkaline Phosphatase: 57 U/L (ref 38–126)
Anion gap: 7 (ref 5–15)
BUN: 17 mg/dL (ref 8–23)
CO2: 25 mmol/L (ref 22–32)
Calcium: 9.7 mg/dL (ref 8.9–10.3)
Chloride: 103 mmol/L (ref 98–111)
Creatinine, Ser: 0.8 mg/dL (ref 0.61–1.24)
GFR, Estimated: >60 mL/min (ref >60)
Glucose, Bld: 118 mg/dL — ABNORMAL HIGH (ref 70–99)
Potassium: 3.8 mmol/L (ref 3.5–5.1)
Sodium: 135 mmol/L (ref 135–145)
Total Bilirubin: 0.6 mg/dL (ref 0.0–1.2)
Total Protein: 6.2 g/dL — ABNORMAL LOW (ref 6.5–8.1)

## 2023-05-12 LAB — CBC WITH DIFFERENTIAL/PLATELET
Abs Immature Granulocytes: 0.03 10*3/uL (ref 0.00–0.07)
Basophils Absolute: 0.1 10*3/uL (ref 0.0–0.1)
Basophils Relative: 1 %
Eosinophils Absolute: 0 10*3/uL (ref 0.0–0.5)
Eosinophils Relative: 1 %
HCT: 29.7 % — ABNORMAL LOW (ref 39.0–52.0)
Hemoglobin: 9.4 g/dL — ABNORMAL LOW (ref 13.0–17.0)
Immature Granulocytes: 1 %
Lymphocytes Relative: 14 %
Lymphs Abs: 0.7 10*3/uL (ref 0.7–4.0)
MCH: 32 pg (ref 26.0–34.0)
MCHC: 31.6 g/dL (ref 30.0–36.0)
MCV: 101 fL — ABNORMAL HIGH (ref 80.0–100.0)
Monocytes Absolute: 0.6 10*3/uL (ref 0.1–1.0)
Monocytes Relative: 12 %
Neutro Abs: 3.5 10*3/uL (ref 1.7–7.7)
Neutrophils Relative %: 71 %
Platelets: 227 10*3/uL (ref 150–400)
RBC: 2.94 MIL/uL — ABNORMAL LOW (ref 4.22–5.81)
RDW: 14.6 % (ref 11.5–15.5)
WBC: 4.9 10*3/uL (ref 4.0–10.5)
nRBC: 0 % (ref 0.0–0.2)

## 2023-05-12 LAB — MAGNESIUM: Magnesium: 1.9 mg/dL (ref 1.7–2.4)

## 2023-05-12 MED ORDER — SODIUM CHLORIDE 0.9 % IV SOLN
INTRAVENOUS | Status: DC
Start: 2023-05-12 — End: 2023-05-12

## 2023-05-12 MED ORDER — HEPARIN SOD (PORK) LOCK FLUSH 100 UNIT/ML IV SOLN
500.0000 [IU] | Freq: Once | INTRAVENOUS | Status: AC | PRN
  Administered 2023-05-12: 500 [IU]

## 2023-05-12 MED ORDER — DEXAMETHASONE SODIUM PHOSPHATE 10 MG/ML IJ SOLN
10.0000 mg | Freq: Once | INTRAMUSCULAR | Status: AC
  Administered 2023-05-12: 10 mg via INTRAVENOUS
  Filled 2023-05-12: qty 1

## 2023-05-12 MED ORDER — ONDANSETRON HCL 4 MG/2ML IJ SOLN
8.0000 mg | Freq: Once | INTRAMUSCULAR | Status: AC
Start: 2023-05-12 — End: 2023-05-12
  Administered 2023-05-12: 8 mg via INTRAVENOUS
  Filled 2023-05-12: qty 4

## 2023-05-12 MED ORDER — SODIUM CHLORIDE 0.9 % IV SOLN: INTRAVENOUS | Status: DC

## 2023-05-12 MED ORDER — SODIUM CHLORIDE 0.9 % IV SOLN
25.0000 mg/m2 | Freq: Once | INTRAVENOUS | Status: AC
Start: 2023-05-12 — End: 2023-05-12
  Administered 2023-05-12: 46 mg via INTRAVENOUS
  Filled 2023-05-12: qty 4.6

## 2023-05-12 MED ORDER — SODIUM CHLORIDE 0.9% FLUSH
10.0000 mL | INTRAVENOUS | Status: DC | PRN
  Administered 2023-05-12: 10 mL

## 2023-05-12 NOTE — Progress Notes (Signed)
 [facility] [address] [place] [address]    Clinic Day:  05/12/2023  Referring physician: Omie Bickers, MD  Patient Care Team: Omie Bickers, MD as PCP - General (Internal Medicine) [name], MD as Consulting Physician (Urology) [name], MD as Medical Oncologist (Medical Oncology) [name], RN as Oncology Nurse Navigator (Medical Oncology)   ASSESSMENT & PLAN:   Assessment: 1.  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  Enlarged contralateral right submandibular node.  Enlargement of the left submandibular gland probably reactive.  No abscess. - Biopsy (07/18/2021) floor of the mouth: Invasive well to moderately differentiated keratinizing squamous cell carcinoma.  Tumor cells negative for p16. - 10/14/2021: Floor of the mouth resection, tracheostomy, bilateral selective neck dissections zones 1-3 by Dr. Tellis Feathers - Pathology: Invasive moderately differentiated keratinizing SCC, 4.1 cm, carcinoma invades for a depth of about 1.9 cm and involves saliva gland tissue.  Anterior, posterior, right, left resection margins are involved.  Deep resection margin is negative.  Metastatic carcinoma 1/3 level 1 lymph nodes.  3 level 2 and 3 level 3 lymph nodes negative for carcinoma.  0/10 lymph nodes involved in the left side neck zone 1, 2, 3 dissection.  ENE not identified.  LVI/perineural invasion not identified. - I have talked to pathologist.  Reexcision margins were considered negative.  He has a high risk feature which is T4 tumor. -  PET scan on 12/12/2021: Soft tissue thickening and calcification along the ventral aspect of the trachea with associated hypermetabolism corresponding to recent tracheostomy site.  7 mm right  paratracheal lymph node new with SUV 2.4.  Hypermetabolism along the dome of the right hepatic lobe with probable 2.4 cm low-attenuation lesion - Liver lesion biopsy (01/28/2022): Metastatic squamous cell carcinoma, keratinizing. - NGS testing: PD-L1 (22 C3): CPS: 100, CD274 (PD-L1) amplified, JAK2 amplified, PIK3CA pathogenic variant exon 10, T p53 pathogenic variant, MS-stable, TMB-low - Per Dr. Tellis Feathers, he also has local recurrence of disease in the floor of the mouth. - Keytruda  from 03/10/2022 through 08/24/2022 with progression - XRT to the floor of the mouth on 06/29/2022 - PET scan 09/10/2022: Liver lesion increased in size measures 3.1 x 2.9 cm, previously 2.6 x 1.7 cm.  Right cervical lymph node meta stasis, mildly progressive. - HER2 by IHC 0 on biopsy from 01/28/2022. - Weekly cetuximab  started on 10/07/2022, discontinued on 04/14/2023 due to progression - PET scan (12/24/2022): Hypermetabolic anterior floor of mouth mass stable.  Stable right level 1B nodal metastasis.  Hypermetabolic 4.6 cm right liver dome metastasis increased in size by 8 mm.  No new sites of hypermetabolic metastatic disease. - MRI liver (01/07/2023): Right hepatic liver lesion measures 4.8 x 3.3 cm.  No new hepatic lesions seen. - Weekly docetaxel  started on 04/14/2023   2.  Social/family history: - He lives in his apartment by himself and is independent of ADLs and IADLs.  He does not drive.  He worked as a Curator and several other jobs.  Quit smoking 1 month ago.  He smoked 1 pack/week for more than 50 years. - 1 brother had agent orange related cancer.  Another brother also had cancer, type unknown to the patient.   3.  Stage Ib pure seminoma: - Status post  right radical orchiectomy on 12/20/2012. - He was on close surveillance rather than adjuvant chemotherapy.    Plan: 1.  Stage IV (PT4PN1 M1) SCC of the floor of the mouth, p16 negative: - He received 3  weekly doses of docetaxel . - He reported more fatigue after  last treatment.  He had occasional nausea.  No vomiting. - Reviewed labs today: Normal LFTs.  Albumin 2.5.  CBC grossly normal. - We have again discussed goals of care.  If he is not tolerating well, we will discontinue treatments and refer him to hospice.  He wants to continue treatment at this time.  We have also discussed the option of changing his treatment every other week as he is having difficulty tolerating back-to-back treatments.  He would like to proceed with treatment today.  Will reevaluate him in 2 weeks.   2.  Oral pain: - Continue liquid hydrocodone  twice daily as needed.  Pain is fairly well-controlled.   3.  Cetuximab  skin rash: - No new rash.  He will discontinue doxycycline .   4.  Weight loss: - He lost about 4 pounds, mostly due to nausea.  Will continue Megace  twice daily.   5.  Hypomagnesemia: -He is taking magnesium  1 tablet twice daily.  He gets nauseous with magnesium .  Magnesium  level today is 1.9.  Will discontinue magnesium .   6.  Hypercalcemia: - Calcium is 9.7 with almond 2.5.  Corrected calcium is around 10.9.  Will give 1 L normal saline.    No orders of the defined types were placed in this encounter.     [name],acting as a Neurosurgeon for [name], MD.,have documented all relevant documentation on the behalf of [name], MD,as directed by  [name], MD while in the presence of [name], MD.  I, [name] MD, have reviewed the above documentation for accuracy and completeness, and I agree with the above.    [name], MD   4/30/202510:58 AM  CHIEF COMPLAINT:   Diagnosis: squamous cell carcinoma of the floor of the mouth, history of testicular seminoma    Cancer Staging  Cancer of floor of mouth [facility]) Staging form: Oral Cavity, AJCC 8th Edition - Clinical stage from 11/25/2021: Stage IVC (cT4a, cN1, pM1) - Signed by [name], MD on 02/04/2022  Testicle cancer,  right Staging form: Testis, AJCC 7th Edition - Clinical: Stage I (T2, N0, M0) - Signed by Doretta Gant, PA-C on 07/23/2013    Prior Therapy: 1. Pembrolizumab , 03/10/22 - 08/24/22 2. XRT to floor of mouth, 06/17/22 - 08/19/22 3. SBRT to liver lesion, 03/15/23 - 03/26/23 4. Cetuximab  weekly, 10/07/22 - 04/14/23  Current Therapy:  docetaxel  weekly    HISTORY OF PRESENT ILLNESS:   Oncology History  Testicle cancer, right  12/20/2012 Surgery   Right total orchiectomyt- 1. Testis, biopsy, right - SEMINOMA. PLEASE SEE COMMENT. 2. Testis, tumor, right - SEMINOMA, 7.5 CM. - ANGIOLYMPHATIC INVASION PRESENT. - RESECTION MARGINS, NEGATIVE FOR ATYPIA OR MALIGNANCY.   01/18/2013 PET scan   Status post right orchiectomy. No findings specific for metastatic disease. Small para-aortic nodes measuring up to 5 mm short axis, without convincing hypermetabolism. Given location, attention on follow-up is suggested.   04/17/2013 Imaging   CT CAP- No evidence of metastatic disease in the chest, abdomen or pelvis. Proximal LAD coronary artery calcification.   07/20/2013 Imaging   CT abd/pelvis- No evidence of metastatic disease in the abdomen or pelvis.   10/24/2013 Imaging   CT abd/pelvis- No  findings to suggest metastatic disease in the abdomen or pelvis   10/24/2013 Imaging   Chest xray- Probable COPD.  Negative for metastatic disease.   02/21/2014 Imaging   CT abd/pelvis- Stable abdominal pelvic CT status post right orchectomy. No evidence of adenopathy or other metastatic disease.   02/21/2014 Imaging   Chest xray- Left lower lobe mild atelectasis and/or infiltrate.     09/10/2014 Imaging   CT abd/pelvis- Status post right orchiectomy.   No evidence of metastatic disease.   3 mm nonobstructing right upper pole renal calculus. No hydronephrosis.   03/14/2015 Imaging   CT abd/pelvis- Stable exam. No evidence of metastatic disease or other acute findings within the abdomen or pelvis.   09/13/2015 Imaging   CT  abd/pelvis- No acute findings and no evidence for mass or adenopathy.    04/24/2016 Imaging   CT abd/pelvis: IMPRESSION: 1. Stable exam. No new or progressive findings. No features to suggest metastatic disease.   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HCC)  10/14/2021 Initial Diagnosis   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HCC)   11/25/2021 Cancer Staging   Staging form: Oral Cavity, AJCC 8th Edition - Clinical stage from 11/25/2021: Stage IVC (cT4a, cN1, pM1) - Signed by [name], MD on 02/04/2022 Histopathologic type: Squamous cell carcinoma, NOS Stage prefix: Initial diagnosis Histologic grade (G): G2 Histologic grading system: 3 grade system   03/10/2022 - 08/24/2022 Chemotherapy   Patient is on Treatment Plan : HEAD/NECK Pembrolizumab  (200) q21d     10/07/2022 - 03/31/2023 Chemotherapy   Patient is on Treatment Plan : HEAD/NECK Cetuximab  q7d     04/14/2023 -  Chemotherapy   Patient is on Treatment Plan : Head and Neck Docetaxel  (35) q7d        INTERVAL HISTORY:   Aztlan is a 83 y.o. male presenting to clinic today for follow up of squamous cell carcinoma of the floor of the mouth, history of testicular seminoma. He was last seen by me on 05/05/23.  Today, he states that he is doing well overall. His appetite level is at 10%. His energy level is at 25%.  PAST MEDICAL HISTORY:   Past Medical History: Past Medical History:  Diagnosis Date   Arthritis    BPH (benign prostatic hyperplasia)    Difficult intubation    HOH (hard of hearing)    Hyperlipidemia 11/02/2017   Hypertension    Hypertension 11/02/2017   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HCC)    2014   Vitamin D deficiency 11/02/2017    Surgical History: Past Surgical History:  Procedure Laterality Date   COLONOSCOPY N/A 11/24/2012   Procedure: COLONOSCOPY;  Surgeon: Ruby Corporal, MD;  [facility];  Service: Endoscopy;  Laterality: N/A;  830-moved to 730 Ann notified pt   FLOOR OF MOUTH BIOPSY N/A 10/14/2021   Procedure:  FLOOR OF MOUTH RESECTION;  Surgeon: Virgina Grills, MD;  Location: [facility] OR;  Service: ENT;  Laterality: N/A;   HEMORROIDECTOMY     KNEE ARTHROSCOPY WITH LATERAL MENISECTOMY Right 08/31/2017   Procedure: KNEE ARTHROSCOPY WITH LATERAL MENISECTOMY;  Surgeon: Darrin Emerald, MD;  Location: [facility] ORS;  Service: Orthopedics;  Laterality: Right;   LESION EXCISION N/A 03/23/2012   Procedure: EXCISION NEOPLASM SCALP ;  Surgeon: Beau Bound, MD;  Location: [facility] ORS;  Service: General;  Laterality: N/A;  Excision of Scalp Neoplasm   ORCHIECTOMY Right 12/20/2012   Procedure: RIGHT RADICAL ORCHIECTOMY/POSSIBLE BX RIGHT TESTICLE;  Surgeon: Reggie Caper, MD;  Location: [facility];  Service: Urology;  Laterality: Right;   PORTACATH PLACEMENT Left 03/25/2022   Procedure: INSERTION PORT-A-CATH;  Surgeon: Alanda Allegra, MD;  Location: [facility];  Service: General;  Laterality: Left;   PROSTATE SURGERY     RADICAL NECK DISSECTION Bilateral 10/14/2021   Procedure: NECK DISSECTION;  Surgeon: Virgina Grills, MD;  Location: [facility] OR;  Service: ENT;  Laterality: Bilateral;   SCALP LACERATION REPAIR     [facility]-Dr Felipe Horton   SKIN FULL THICKNESS GRAFT Bilateral 10/14/2021   Procedure: PLATYSMA FLAP CLOSURE;  Surgeon: Virgina Grills, MD;  Location: [facility] OR;  Service: ENT;  Laterality: Bilateral;   TOOTH EXTRACTION  10/14/2021   Procedure: DENTAL EXTRACTIONS;  Surgeon: Virgina Grills, MD;  Location: [facility] OR;  Service: ENT;;   TRACHEOSTOMY TUBE PLACEMENT N/A 10/14/2021   Procedure: TRACHEOSTOMY;  Surgeon: Virgina Grills, MD;  Location: [facility] OR;  Service: ENT;  Laterality: N/A;    Social History: Social History   Socioeconomic History   Marital status: Single    Spouse name: Not on file   Number of children: Not on file   Years of education: Not on file   Highest education level: Not on file  Occupational History   Not on file  Tobacco Use   Smoking status: Former    Current packs/day: 0.25    Average packs/day: 0.3 packs/day for  50.0 years (12.5 ttl pk-yrs)    Types: Cigarettes   Smokeless tobacco: Never  Vaping Use   Vaping status: Never Used  Substance and Sexual Activity   Alcohol use: Not Currently   Drug use: No   Sexual activity: Yes    Birth control/protection: None  Other Topics Concern   Not on file  Social History Narrative   Not on file   Social Drivers of Health   Financial Resource Strain: Not on file  Food Insecurity: Not on file  Transportation Needs: Not on file  Physical Activity: Not on file  Stress: Not on file  Social Connections: Not on file  Intimate Partner Violence: Not on file    Family History: Family History  Problem Relation Age of Onset   Cancer Brother    Cancer Brother     Current Medications:  Current Outpatient Medications:    acetaminophen  (TYLENOL ) 650 MG CR tablet, Take 650 mg by mouth every 8 (eight) hours as needed for pain., Disp: , Rfl:    amLODipine -olmesartan  (AZOR ) 5-20 MG tablet, TAKE 1 TABLET BY MOUTH DAILY, Disp: 90 tablet, Rfl: 0   budesonide -formoterol  (SYMBICORT ) 80-4.5 MCG/ACT inhaler, Inhale 2 puffs into the lungs in the morning and at bedtime., Disp: 1 each, Rfl: 12   chlorhexidine  (PERIDEX ) 0.12 % solution, Use as directed 5 mLs in the mouth or throat 2 (two) times daily., Disp: , Rfl:    Cholecalciferol (VITAMIN D3) 50 MCG (2000 UT) TABS, Take 2,000 Units by mouth daily., Disp: , Rfl:    ciprofloxacin-dexamethasone  (CIPRODEX) OTIC suspension, Place 4 drops into the left ear 2 (two) times daily., Disp: , Rfl:    clindamycin  (CLINDAGEL) 1 % gel, Apply topically 2 (two) times daily. Apply twice daily to chest rash, Disp: 30 g, Rfl: 0   doxycycline  (VIBRA -TABS) 100 MG tablet, TAKE 1 TABLET(100 MG) BY MOUTH TWICE DAILY, Disp: 60 tablet, Rfl: 3   HYDROcodone -acetaminophen  (HYCET) 7.5-325 mg/15 ml solution, Take 15 mLs by mouth every 6 (six) hours as needed for moderate pain (pain score 4-6)., Disp: 473 mL, Rfl: 0   lidocaine  (XYLOCAINE ) 2 %  solution, Take  by mouth., Disp: , Rfl:    magnesium  oxide (MAG-OX) 400 MG tablet, Take 2 tablets (800 mg total) by mouth 2 (two) times daily., Disp: 120 tablet, Rfl: 3   megestrol  (MEGACE ) 400 MG/10ML suspension, Take 10 mLs (400 mg total) by mouth 2 (two) times daily., Disp: 480 mL, Rfl: 3   Menthol, Topical Analgesic, (BIOFREEZE EX), Apply 1 application  topically daily as needed (pain)., Disp: , Rfl:    naloxone  (NARCAN ) nasal spray 4 mg/0.1 mL, SMARTSIG:Both Nares, Disp: , Rfl:    Polyethyl Glycol-Propyl Glycol (GOODSENSE LUBRICANT EYE DROPS OP), Place 1 drop into both eyes daily as needed (dry eyes)., Disp: , Rfl:    pravastatin  (PRAVACHOL ) 80 MG tablet, Take 80 mg by mouth daily., Disp: , Rfl:    RAPAFLO 8 MG CAPS capsule, Take 8 mg by mouth daily. Silodosin, Disp: , Rfl:    vitamin B-12 (CYANOCOBALAMIN ) 500 MCG tablet, Take 500 mcg by mouth daily., Disp: , Rfl:    Allergies: No Known Allergies  REVIEW OF SYSTEMS:   Review of Systems  Constitutional:  Negative for chills, fatigue and fever.  HENT:   Positive for trouble swallowing. Negative for lump/mass, mouth sores, nosebleeds and sore throat.   Eyes:  Negative for eye problems.  Respiratory:  Negative for cough and shortness of breath.   Cardiovascular:  Negative for chest pain, leg swelling and palpitations.  Gastrointestinal:  Positive for nausea and vomiting. Negative for abdominal pain and constipation.  Genitourinary:  Negative for bladder incontinence, difficulty urinating, dysuria, frequency, hematuria and nocturia.   Musculoskeletal:  Negative for arthralgias, back pain, flank pain, myalgias and neck pain.  Skin:  Negative for itching and rash.  Neurological:  Negative for dizziness, headaches and numbness.  Hematological:  Does not bruise/bleed easily.  Psychiatric/Behavioral:  Negative for depression, sleep disturbance and suicidal ideas. The patient is not nervous/anxious.   All other systems reviewed and are  negative.    VITALS:   Blood pressure (!) 99/59, pulse (!) 104, weight 139 lb 5.3 oz (63.2 kg).  Wt Readings from Last 3 Encounters:  05/12/23 139 lb 5.3 oz (63.2 kg)  05/05/23 143 lb 4.8 oz (65 kg)  04/28/23 149 lb 11.1 oz (67.9 kg)    Body mass index is 19.43 kg/m.  Performance status (ECOG): 1 - Symptomatic but completely ambulatory  PHYSICAL EXAM:   Physical Exam Vitals and nursing note reviewed. Exam conducted with a chaperone present.  Constitutional:      Appearance: Normal appearance.  Cardiovascular:     Rate and Rhythm: Normal rate and regular rhythm.     Pulses: Normal pulses.     Heart sounds: Normal heart sounds.  Pulmonary:     Effort: Pulmonary effort is normal.     Breath sounds: Normal breath sounds.  Abdominal:     Palpations: Abdomen is soft. There is no hepatomegaly, splenomegaly or mass.     Tenderness: There is no abdominal tenderness.  Musculoskeletal:     Right lower leg: No edema.     Left lower leg: No edema.  Lymphadenopathy:     Cervical: No cervical adenopathy.     Right cervical: No superficial, deep or posterior cervical adenopathy.    Left cervical: No superficial, deep or posterior cervical adenopathy.     Upper Body:     Right upper body: No supraclavicular or axillary adenopathy.     Left upper body: No supraclavicular or axillary adenopathy.  Neurological:     General: No  focal deficit present.     Mental Status: He is alert and oriented to person, place, and time.  Psychiatric:        Mood and Affect: Mood normal.        Behavior: Behavior normal.    LABS:   CBC     Component Value Date/Time   WBC 4.9 05/12/2023 0943   RBC 2.94 (L) 05/12/2023 0943   HGB 9.4 (L) 05/12/2023 0943   HCT 29.7 (L) 05/12/2023 0943   PLT 227 05/12/2023 0943   MCV 101.0 (H) 05/12/2023 0943   MCH 32.0 05/12/2023 0943   MCHC 31.6 05/12/2023 0943   RDW 14.6 05/12/2023 0943   LYMPHSABS 0.7 05/12/2023 0943   MONOABS 0.6 05/12/2023 0943   EOSABS  0.0 05/12/2023 0943   BASOSABS 0.1 05/12/2023 0943    CMP      Component Value Date/Time   NA 135 05/12/2023 0943   K 3.8 05/12/2023 0943   CL 103 05/12/2023 0943   CO2 25 05/12/2023 0943   GLUCOSE 118 (H) 05/12/2023 0943   BUN 17 05/12/2023 0943   CREATININE 0.80 05/12/2023 0943   CALCIUM 9.7 05/12/2023 0943   PROT 6.2 (L) 05/12/2023 0943   ALBUMIN 2.5 (L) 05/12/2023 0943   AST 30 05/12/2023 0943   ALT 27 05/12/2023 0943   ALKPHOS 57 05/12/2023 0943   BILITOT 0.6 05/12/2023 0943   GFRNONAA >60 05/12/2023 0943   GFRAA >60 10/26/2018 1137     Lab Results  Component Value Date   CEA 2.2 01/19/2013   /  CEA  Date Value Ref Range Status  01/19/2013 2.2 0.0 - 5.0 ng/mL Final    Comment:    Performed at [facility]   No results found for: "PSA1" No results found for: "CAN199" No results found for: "CAN125"  No results found for: "TOTALPROTELP", "ALBUMINELP", "A1GS", "A2GS", "BETS", "BETA2SER", "GAMS", "MSPIKE", "SPEI" Lab Results  Component Value Date   TIBC 244 (L) 08/04/2022   TIBC 251 05/12/2022   FERRITIN 273 08/04/2022   FERRITIN 195 05/12/2022   IRONPCTSAT 19 08/04/2022   IRONPCTSAT 18 05/12/2022   Lab Results  Component Value Date   LDH 172 11/12/2020   LDH 175 10/26/2018   LDH 179 10/25/2013     STUDIES:   No results found.

## 2023-05-12 NOTE — Progress Notes (Signed)
 Patient presents today for follow up appointment with Dr. Katragadda and treatment. Labs within parameters for treatment. Vital signs stable. Heart rate 104 and blood pressure recheck 99/59.   Message received from A.Alva Jewels RN / Dr. Katragadda to proceed with treatment. Per A.Evalene Hilda / Dr. Cheree Cords treatment switching to every other week. Labs and vital signs reviewed by MD.  Message received from A.Anderson RN/ Dr. Cheree Cords to infuse 1 liter of normal saline over 2 hours for corrected calcium 10.9.   Treatment given today per MD orders. Tolerated infusion without adverse affects. Vital signs stable. No complaints at this time. Discharged from clinic by wheel chair in stable condition. Alert and oriented x 3. F/U with [facility] as scheduled.

## 2023-05-12 NOTE — Patient Instructions (Addendum)
 [facility] at [facility] Discharge Instructions   You were seen and examined today by Dr. Cheree Cords.  He reviewed the results of your lab work which are normal/stable.   You may stop your magnesium  completely.   Once the rash is gone you may stop taking the antibiotic (doxycycline ).   We will proceed with your treatment today. We will switch your treatments to every other week going forward.   Return as scheduled.    Thank you for choosing [facility] at [facility] to provide your oncology and hematology care.  To afford each patient quality time with our provider, please arrive at least 15 minutes before your scheduled appointment time.   If you have a lab appointment with the Cancer Center please come in thru the Main Entrance and check in at the main information desk.  You need to re-schedule your appointment should you arrive 10 or more minutes late.  We strive to give you quality time with our providers, and arriving late affects you and other patients whose appointments are after yours.  Also, if you no show three or more times for appointments you may be dismissed from the clinic at the providers discretion.     Again, thank you for choosing [facility].  Our hope is that these requests will decrease the amount of time that you wait before being seen by our physicians.       _____________________________________________________________  Should you have questions after your visit to [facility], please contact our office at [phone] and follow the prompts.  Our office hours are 8:00 a.m. and 4:30 p.m. Monday - Friday.  Please note that voicemails left after 4:00 p.m. may not be returned until the following business day.  We are closed weekends and major holidays.  You do have access to a nurse 24-7, just call the main number to the clinic [phone] and do not press any options, hold on the line and a  nurse will answer the phone.    For prescription refill requests, have your pharmacy contact our office and allow 72 hours.    Due to Covid, you will need to wear a mask upon entering the hospital. If you do not have a mask, a mask will be given to you at the Main Entrance upon arrival. For doctor visits, patients may have 1 support person age 15 or older with them. For treatment visits, patients can not have anyone with them due to social distancing guidelines and our immunocompromised population.

## 2023-05-12 NOTE — Progress Notes (Signed)
 Patient has been examined by Dr. Ellin Saba. Vital signs (HR 104) and labs have been reviewed by MD - ANC, Creatinine, LFTs, hemoglobin, and platelets are within treatment parameters per M.D. - pt may proceed with treatment.  Primary RN and pharmacy notified.

## 2023-05-12 NOTE — Patient Instructions (Signed)
 [facility] [place] - A DEPT OF [facility]  Discharge Instructions: Thank you for choosing [facility] to provide your oncology and hematology care.  If you have a lab appointment with the Cancer Center - please note that after April 8th, 2024, all labs will be drawn in the cancer center.  You do not have to check in or register with the main entrance as you have in the past but will complete your check-in in the cancer center.  Wear comfortable clothing and clothing appropriate for easy access to any Portacath or PICC line.   We strive to give you quality time with your provider. You may need to reschedule your appointment if you arrive late (15 or more minutes).  Arriving late affects you and other patients whose appointments are after yours.  Also, if you miss three or more appointments without notifying the office, you may be dismissed from the clinic at the provider's discretion.      For prescription refill requests, have your pharmacy contact our office and allow 72 hours for refills to be completed.    Today you received the following chemotherapy and/or immunotherapy agents Docetaxel.   Docetaxel Injection What is this medication? DOCETAXEL (doe se TAX el) treats some types of cancer. It works by slowing down the growth of cancer cells. This medicine may be used for other purposes; ask your health care provider or pharmacist if you have questions. COMMON BRAND NAME(S): BEIZRAY, Docefrez, Docivyx, Taxotere What should I tell my care team before I take this medication? They need to know if you have any of these conditions: Kidney disease Liver disease Low white blood cell levels Tingling of the fingers or toes or other nerve disorder An unusual or allergic reaction to docetaxel, polysorbate 80, other medications, foods, dyes, or preservatives Pregnant or trying to get pregnant Breast-feeding How should I use this medication? This medication  is injected into a vein. It is given by your care team in a hospital or clinic setting. Talk to your care team about the use of this medication in children. Special care may be needed. Overdosage: If you think you have taken too much of this medicine contact a poison control center or emergency room at once. NOTE: This medicine is only for you. Do not share this medicine with others. What if I miss a dose? Keep appointments for follow-up doses. It is important not to miss your dose. Call your care team if you are unable to keep an appointment. What may interact with this medication? Do not take this medication with any of the following: Live virus vaccines This medication may also interact with the following: Certain antibiotics, such as clarithromycin, telithromycin Certain antivirals for HIV or hepatitis Certain medications for fungal infections, such as itraconazole, ketoconazole, voriconazole Grapefruit juice Nefazodone Supplements, such as St. John's wort This list may not describe all possible interactions. Give your health care provider a list of all the medicines, herbs, non-prescription drugs, or dietary supplements you use. Also tell them if you smoke, drink alcohol, or use illegal drugs. Some items may interact with your medicine. What should I watch for while using this medication? This medication may make you feel generally unwell. This is not uncommon as chemotherapy can affect healthy cells as well as cancer cells. Report any side effects. Continue your course of treatment even though you feel ill unless your care team tells you to stop. You may need blood work done  while you are taking this medication. This medication can cause serious side effects and infusion reactions. To reduce the risk, your care team may give you other medications to take before receiving this one. Be sure to follow the directions from your care team. This medication may increase your risk of getting an  infection. Call your care team for advice if you get a fever, chills, sore throat, or other symptoms of a cold or flu. Do not treat yourself. Try to avoid being around people who are sick. Avoid taking medications that contain aspirin, acetaminophen, ibuprofen, naproxen, or ketoprofen unless instructed by your care team. These medications may hide a fever. Be careful brushing or flossing your teeth or using a toothpick because you may get an infection or bleed more easily. If you have any dental work done, tell your dentist you are receiving this medication. Some products may contain alcohol. Ask your care team if this medication contains alcohol. Be sure to tell all care teams you are taking this medicine. Certain medications, like metronidazole and disulfiram, can cause an unpleasant reaction when taken with alcohol. The reaction includes flushing, headache, nausea, vomiting, sweating, and increased thirst. The reaction can last from 30 minutes to several hours. This medication may affect your coordination, reaction time, or judgement. Do not drive or operate machinery until you know how this medication affects you. Sit up or stand slowly to reduce the risk of dizzy or fainting spells. Drinking alcohol with this medication can increase the risk of these side effects. Talk to your care team about your risk of cancer. You may be more at risk for certain types of cancer if you take this medication. Talk to your care team if you wish to become pregnant or think you might be pregnant. This medication can cause serious birth defects if taken during pregnancy or if you get pregnant within 2 months after stopping therapy. A negative pregnancy test is required before starting this medication. A reliable form of contraception is recommended while taking this medication and for 2 months after stopping it. Talk to your care team about reliable forms of contraception. Do not breast-feed while taking this medication and  for 1 week after stopping therapy. Use a condom during sex and for 4 months after stopping therapy. Tell your care team right away if you think your partner might be pregnant. This medication can cause serious birth defects. This medication may cause infertility. Talk to your care team if you are concerned about your fertility. What side effects may I notice from receiving this medication? Side effects that you should report to your care team as soon as possible: Allergic reactions--skin rash, itching, hives, swelling of the face, lips, tongue, or throat Change in vision such as blurry vision, seeing halos around lights, vision loss Infection--fever, chills, cough, or sore throat Infusion reactions--chest pain, shortness of breath or trouble breathing, feeling faint or lightheaded Low red blood cell level--unusual weakness or fatigue, dizziness, headache, trouble breathing Pain, tingling, or numbness in the hands or feet Painful swelling, warmth, or redness of the skin, blisters or sores at the infusion site Redness, blistering, peeling, or loosening of the skin, including inside the mouth Sudden or severe stomach pain, bloody diarrhea, fever, nausea, vomiting Swelling of the ankles, hands, or feet Tumor lysis syndrome (TLS)--nausea, vomiting, diarrhea, decrease in the amount of urine, dark urine, unusual weakness or fatigue, confusion, muscle pain or cramps, fast or irregular heartbeat, joint pain Unusual bruising or bleeding Side effects that  usually do not require medical attention (report to your care team if they continue or are bothersome): Change in nail shape, thickness, or color Change in taste Hair loss Increased tears This list may not describe all possible side effects. Call your doctor for medical advice about side effects. You may report side effects to FDA at 1-800-FDA-1088. Where should I keep my medication? This medication is given in a hospital or clinic. It will not be  stored at home. NOTE: This sheet is a summary. It may not cover all possible information. If you have questions about this medicine, talk to your doctor, pharmacist, or health care provider.  2024 Elsevier/Gold Standard (2021-03-06 00:00:00)          To help prevent nausea and vomiting after your treatment, we encourage you to take your nausea medication as directed.  BELOW ARE SYMPTOMS THAT SHOULD BE REPORTED IMMEDIATELY: *FEVER GREATER THAN 100.4 F (38 C) OR HIGHER *CHILLS OR SWEATING *NAUSEA AND VOMITING THAT IS NOT CONTROLLED WITH YOUR NAUSEA MEDICATION *UNUSUAL SHORTNESS OF BREATH *UNUSUAL BRUISING OR BLEEDING *URINARY PROBLEMS (pain or burning when urinating, or frequent urination) *BOWEL PROBLEMS (unusual diarrhea, constipation, pain near the anus) TENDERNESS IN MOUTH AND THROAT WITH OR WITHOUT PRESENCE OF ULCERS (sore throat, sores in mouth, or a toothache) UNUSUAL RASH, SWELLING OR PAIN  UNUSUAL VAGINAL DISCHARGE OR ITCHING   Items with * indicate a potential emergency and should be followed up as soon as possible or go to the Emergency Department if any problems should occur.  Please show the CHEMOTHERAPY ALERT CARD or IMMUNOTHERAPY ALERT CARD at check-in to the Emergency Department and triage nurse.  Should you have questions after your visit or need to cancel or reschedule your appointment, please contact [facility] [place] - A DEPT OF [facility] [phone]  and follow the prompts.  Office hours are 8:00 a.m. to 4:30 p.m. Monday - Friday. Please note that voicemails left after 4:00 p.m. may not be returned until the following business day.  We are closed weekends and major holidays. You have access to a nurse at all times for urgent questions. Please call the main number to the clinic [phone] and follow the prompts.  For any non-urgent questions, you may also contact your provider using MyChart. We now offer e-Visits for anyone 59 and older  to request care online for non-urgent symptoms. For details visit [email or web address].   Also download the MyChart app! Go to the app store, search "MyChart", open the app, select [place], and log in with your MyChart username and password.

## 2023-05-13 ENCOUNTER — Emergency Department (HOSPITAL_COMMUNITY)
Admission: EM | Admit: 2023-05-13 | Discharge: 2023-05-13 | Disposition: A | Discharge status: Home / Self Care | Attending: Emergency Medicine | Admitting: Emergency Medicine

## 2023-05-13 ENCOUNTER — Other Ambulatory Visit: Payer: Self-pay

## 2023-05-13 ENCOUNTER — Telehealth: Payer: Self-pay | Admitting: Dietician

## 2023-05-13 ENCOUNTER — Inpatient Hospital Stay: Admitting: Dietician

## 2023-05-13 ENCOUNTER — Encounter (HOSPITAL_COMMUNITY): Payer: Self-pay

## 2023-05-13 ENCOUNTER — Emergency Department (HOSPITAL_COMMUNITY)

## 2023-05-13 DIAGNOSIS — R112 Nausea with vomiting, unspecified: Secondary | ICD-10-CM | POA: Insufficient documentation

## 2023-05-13 DIAGNOSIS — I10 Essential (primary) hypertension: Secondary | ICD-10-CM | POA: Insufficient documentation

## 2023-05-13 DIAGNOSIS — Z79899 Other long term (current) drug therapy: Secondary | ICD-10-CM | POA: Insufficient documentation

## 2023-05-13 DIAGNOSIS — Z8547 Personal history of malignant neoplasm of testis: Secondary | ICD-10-CM | POA: Insufficient documentation

## 2023-05-13 DIAGNOSIS — K769 Liver disease, unspecified: Secondary | ICD-10-CM | POA: Diagnosis not present

## 2023-05-13 DIAGNOSIS — N281 Cyst of kidney, acquired: Secondary | ICD-10-CM | POA: Diagnosis not present

## 2023-05-13 DIAGNOSIS — N200 Calculus of kidney: Secondary | ICD-10-CM | POA: Diagnosis not present

## 2023-05-13 LAB — CBC WITH DIFFERENTIAL/PLATELET
Abs Immature Granulocytes: 0.04 10*3/uL (ref 0.00–0.07)
Basophils Absolute: 0 10*3/uL (ref 0.0–0.1)
Basophils Relative: 1 %
Eosinophils Absolute: 0 10*3/uL (ref 0.0–0.5)
Eosinophils Relative: 0 %
HCT: 31.8 % — ABNORMAL LOW (ref 39.0–52.0)
Hemoglobin: 10.3 g/dL — ABNORMAL LOW (ref 13.0–17.0)
Immature Granulocytes: 1 %
Lymphocytes Relative: 14 %
Lymphs Abs: 0.8 10*3/uL (ref 0.7–4.0)
MCH: 33.1 pg (ref 26.0–34.0)
MCHC: 32.4 g/dL (ref 30.0–36.0)
MCV: 102.3 fL — ABNORMAL HIGH (ref 80.0–100.0)
Monocytes Absolute: 0.5 10*3/uL (ref 0.1–1.0)
Monocytes Relative: 9 %
Neutro Abs: 4.5 10*3/uL (ref 1.7–7.7)
Neutrophils Relative %: 75 %
Platelets: 252 10*3/uL (ref 150–400)
RBC: 3.11 MIL/uL — ABNORMAL LOW (ref 4.22–5.81)
RDW: 14.9 % (ref 11.5–15.5)
WBC: 5.9 10*3/uL (ref 4.0–10.5)
nRBC: 0 % (ref 0.0–0.2)

## 2023-05-13 LAB — URINALYSIS, ROUTINE W REFLEX MICROSCOPIC
Bacteria, UA: NONE SEEN
Bilirubin Urine: NEGATIVE
Glucose, UA: NEGATIVE mg/dL
Hgb urine dipstick: NEGATIVE
Ketones, ur: NEGATIVE mg/dL
Leukocytes,Ua: NEGATIVE
Nitrite: NEGATIVE
Protein, ur: 30 mg/dL — AB
Specific Gravity, Urine: 1.018 (ref 1.005–1.030)
pH: 6 (ref 5.0–8.0)

## 2023-05-13 LAB — LACTIC ACID, PLASMA
Lactic Acid, Venous: 1.7 mmol/L (ref 0.5–1.9)
Lactic Acid, Venous: 1.6 mmol/L (ref 0.5–1.9)

## 2023-05-13 LAB — COMPREHENSIVE METABOLIC PANEL WITH GFR
ALT: 26 U/L (ref 0–44)
AST: 36 U/L (ref 15–41)
Albumin: 2.8 g/dL — ABNORMAL LOW (ref 3.5–5.0)
Alkaline Phosphatase: 55 U/L (ref 38–126)
Anion gap: 8 (ref 5–15)
BUN: 18 mg/dL (ref 8–23)
CO2: 24 mmol/L (ref 22–32)
Calcium: 9.9 mg/dL (ref 8.9–10.3)
Chloride: 103 mmol/L (ref 98–111)
Creatinine, Ser: 0.68 mg/dL (ref 0.61–1.24)
GFR, Estimated: >60 mL/min (ref >60)
Glucose, Bld: 103 mg/dL — ABNORMAL HIGH (ref 70–99)
Potassium: 4.6 mmol/L (ref 3.5–5.1)
Sodium: 135 mmol/L (ref 135–145)
Total Bilirubin: 0.6 mg/dL (ref 0.0–1.2)
Total Protein: 6.7 g/dL (ref 6.5–8.1)

## 2023-05-13 LAB — TROPONIN I (HIGH SENSITIVITY)
Troponin I (High Sensitivity): 50 ng/L — ABNORMAL HIGH (ref <18)
Troponin I (High Sensitivity): 54 ng/L — ABNORMAL HIGH (ref <18)

## 2023-05-13 LAB — LIPASE, BLOOD: Lipase: 107 U/L — ABNORMAL HIGH (ref 11–51)

## 2023-05-13 MED ORDER — ONDANSETRON 4 MG PO TBDP: 4.0000 mg | ORAL_TABLET | Freq: Three times a day (TID) | ORAL | 0 refills | Status: DC | PRN

## 2023-05-13 MED ORDER — LACTATED RINGERS IV BOLUS
1000.0000 mL | Freq: Once | INTRAVENOUS | Status: AC
  Administered 2023-05-13: 1000 mL via INTRAVENOUS

## 2023-05-13 MED ORDER — MORPHINE SULFATE (PF) 4 MG/ML IV SOLN
4.0000 mg | Freq: Once | INTRAVENOUS | Status: AC
  Administered 2023-05-13: 4 mg via INTRAVENOUS
  Filled 2023-05-13: qty 1

## 2023-05-13 MED ORDER — ONDANSETRON HCL 4 MG/2ML IJ SOLN
4.0000 mg | Freq: Once | INTRAMUSCULAR | Status: AC
  Administered 2023-05-13: 4 mg via INTRAVENOUS
  Filled 2023-05-13: qty 2

## 2023-05-13 MED ORDER — IOHEXOL 300 MG/ML  SOLN
100.0000 mL | Freq: Once | INTRAMUSCULAR | Status: AC | PRN
  Administered 2023-05-13: 100 mL via INTRAVENOUS

## 2023-05-13 NOTE — ED Notes (Signed)
 Pt given water  for PO challenge, pt tolerated well and denies N/V. Pt states he feels ready to go home. MD made aware.

## 2023-05-13 NOTE — Discharge Instructions (Addendum)
 Thank you for coming to [facility] Emergency Department. You were seen for nausea and vomiting as well as abdominal pain. We did an exam, labs, and imaging, and these showed no acute findings.  We have prescribed Zofran  under the tongue every 6-8 hours as needed for nausea and vomiting.  Please stay well-hydrated at home. Please follow up with your primary care provider within 1 week.   Do not hesitate to return to the ED or call 911 if you experience: -Worsening symptoms -Nausea vomiting so severe you cannot take your medications or eat or drink -Severe abdominal pain -Lightheadedness, passing out -Fevers/chills -Anything else that concerns you

## 2023-05-13 NOTE — ED Triage Notes (Signed)
 Pt arrived via POV c/o emesis that began last night. Per family present in Triage, Pt was here yesterday for cancer treatment and after being discharged home, he began having multiple episodes of emesis.

## 2023-05-13 NOTE — ED Provider Notes (Signed)
 [facility] EMERGENCY DE[facility]MENT AT [facility] Provider Note   CSN: [id number] Arrival date & time: 05/13/23  1319     History  Chief Complaint  Patient presents with   Emesis    CHANCELER KOLB is a 83 y.o. male with PMH as listed below who presents via POV c/o emesis that began last night. Per family present in Triage, Pt was here yesterday for cancer treatment and after being discharged home, he began having multiple episodes of emesis. A/w upper abdominal pain that goes across the upper abdomen rated 6/10. Unable to take PO. No diarrhea, last BM was a couple days ago. Is passing gas. No h/o abdominal surgeries.  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     Past Medical History:  Diagnosis Date   Arthritis    BPH (benign prostatic hyperplasia)    Difficult intubation    HOH (hard of hearing)    Hyperlipidemia 11/02/2017   Hypertension    Hypertension 11/02/2017   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HCC)    2014   Vitamin D deficiency 11/02/2017       Home Medications Prior to Admission medications   Medication Sig Start Date End Date Taking? Authorizing Provider  ondansetron  (ZOFRAN -ODT) 4 MG disintegrating tablet Take 1 tablet (4 mg total) by mouth every 8 (eight) hours as needed for nausea or vomiting. 05/13/23  Yes Merdis Stalling, MD  acetaminophen  (TYLENOL ) 650 MG CR tablet Take 650 mg by mouth every 8 (eight) hours as needed for pain.    [provider]  amLODipine -olmesartan  (AZOR ) 5-20 MG tablet TAKE 1 TABLET BY MOUTH DAILY 09/22/22   Wert, Michael B, MD  budesonide -formoterol  (SYMBICORT ) 80-4.5 MCG/ACT inhaler Inhale 2 puffs into the lungs in the morning and at bedtime. 05/29/22   Kommor, Madison, MD  chlorhexidine  (PERIDEX ) 0.12 % solution Use as directed 5 mLs in the mouth or throat 2 (two) times daily. 04/13/23   [provider]  Cholecalciferol (VITAMIN D3) 50 MCG (2000 UT) TABS Take 2,000 Units by mouth daily.     [provider]  ciprofloxacin-dexamethasone  (CIPRODEX) OTIC suspension Place 4 drops into the left ear 2 (two) times daily. 11/18/22   [provider]  clindamycin  (CLINDAGEL) 1 % gel Apply topically 2 (two) times daily. Apply twice daily to chest rash 10/26/22   [name], MD  doxycycline  (VIBRA -TABS) 100 MG tablet TAKE 1 TABLET(100 MG) BY MOUTH TWICE DAILY 04/06/23   [name], MD  HYDROcodone -acetaminophen  (HYCET) 7.5-325 mg/15 ml solution Take 15 mLs by mouth every 6 (six) hours as needed for moderate pain (pain score 4-6). 05/06/23   [name], MD  lidocaine  (XYLOCAINE ) 2 % solution Take by mouth. 07/30/22   [provider]  magnesium  oxide (MAG-OX) 400 MG tablet Take 2 tablets (800 mg total) by mouth 2 (two) times daily. 01/14/23   [name], MD  megestrol  (MEGACE ) 400 MG/10ML suspension Take 10 mLs (400 mg total) by mouth 2 (two) times daily. 04/29/23   [name], MD  Menthol, Topical Analgesic, (BIOFREEZE EX) Apply 1 application  topically daily as needed (pain).    [provider]  naloxone  (NARCAN ) nasal spray 4 mg/0.1 mL SMARTSIG:Both Nares 04/11/22   [provider]  Polyethyl Glycol-Propyl Glycol (GOODSENSE LUBRICANT EYE DROPS OP) Place 1 drop into both eyes daily as needed (dry eyes).    [provider]  pravastatin  (PRAVACHOL ) 80 MG tablet Take 80 mg by mouth  daily.    [provider]  RAPAFLO 8 MG CAPS capsule Take 8 mg by mouth daily. Silodosin 10/13/13   [provider]  vitamin B-12 (CYANOCOBALAMIN ) 500 MCG tablet Take 500 mcg by mouth daily.    [provider]      Allergies    Patient has no known allergies.    Review of Systems   Review of Systems A 10 point review of systems was performed and is negative unless otherwise reported in HPI.  Physical Exam Updated Vital Signs BP 128/64 (BP Location: Right Arm)   Pulse 85   Temp 98.3 F (36.8  C) (Oral)   Resp 13   Ht 5\' 11"  (1.803 m)   Wt 63.2 kg   SpO2 97%   BMI 19.43 kg/m  Physical Exam General: chronically ill appearing elderly male, lying in bed.  HEENT: PERRLA, Sclera anicteric, MMM, trachea midline. Deformity to mouth and jaw at baseline d/t cancer Cardiology: RRR, no murmurs/rubs/gallops. Resp: Normal respiratory rate and effort. CTAB, no wheezes, rhonchi, crackles.  Abd: Soft, TTP diffusely in upper abdomen, non-distended. No rebound tenderness or guarding.  GU: Deferred. MSK: No peripheral edema or signs of trauma. Extremities without deformity or TTP. No cyanosis or clubbing. Skin: warm, dry.  Back: No CVA tenderness Neuro: A&Ox4, CNs II-XII grossly intact. MAEs. Sensation grossly intact.  Psych: Normal mood and affect.   ED Results / Procedures / Treatments   Labs (all labs ordered are listed, but only abnormal results are displayed) Labs Reviewed  CBC WITH DIFFERENTIAL/PLATELET - Abnormal; Notable for the following components:      Result Value   RBC 3.11 (*)    Hemoglobin 10.3 (*)    HCT 31.8 (*)    MCV 102.3 (*)    All other components within normal limits  COMPREHENSIVE METABOLIC PANEL WITH GFR - Abnormal; Notable for the following components:   Glucose, Bld 103 (*)    Albumin 2.8 (*)    All other components within normal limits  LIPASE, BLOOD - Abnormal; Notable for the following components:   Lipase 107 (*)    All other components within normal limits  URINALYSIS, ROUTINE W REFLEX MICROSCOPIC - Abnormal; Notable for the following components:   Protein, ur 30 (*)    All other components within normal limits  TROPONIN I (HIGH SENSITIVITY) - Abnormal; Notable for the following components:   Troponin I (High Sensitivity) 50 (*)    All other components within normal limits  TROPONIN I (HIGH SENSITIVITY) - Abnormal; Notable for the following components:   Troponin I (High Sensitivity) 54 (*)    All other components within normal limits  LACTIC  ACID, PLASMA  LACTIC ACID, PLASMA  I-STAT CHEM 8, ED    EKG None  Radiology CT ABDOMEN PELVIS W CONTRAST Result Date: 05/13/2023 CLINICAL DATA:  Acute nonlocalized abdominal pain. Vomiting beginning last night. Vomiting after discharge from cancer treatment. EXAM: CT ABDOMEN AND PELVIS WITH CONTRAST TECHNIQUE: Multidetector CT imaging of the abdomen and pelvis was performed using the standard protocol following bolus administration of intravenous contrast. RADIATION DOSE REDUCTION: This exam was performed according to the departmental dose-optimization program which includes automated exposure control, adjustment of the mA and/or kV according to patient size and/or use of iterative reconstruction technique. CONTRAST:  OMNIPAQUE  IOHEXOL  300 MG/ML  SOLN COMPARISON:  PET-CT 04/08/2023.  MRI abdomen 01/07/2023 FINDINGS: Lower chest: Atelectasis or infiltration in the lung bases. Mild cardiac enlargement. Hepatobiliary: Focal heterogeneously hypoenhancing lesion in  the right lobe of the liver measuring 5.4 x 6 cm. This corresponds to known neoplasm. Gallbladder and bile ducts are normal. Pancreas: Unremarkable. No pancreatic ductal dilatation or surrounding inflammatory changes. Spleen: Normal in size without focal abnormality. Adrenals/Urinary Tract: No adrenal gland nodules. Symmetrical nephrograms. Stone in the lower pole right kidney measuring 5 mm diameter. No hydronephrosis or hydroureter. Bilateral renal cysts are unchanged since prior study. No imaging follow-up is indicated. Bladder is normal. Stomach/Bowel: Stomach, small bowel, and colon are not abnormally distended. No wall thickening or inflammatory changes. Stool throughout the colon. Appendix is not identified. Vascular/Lymphatic: Aortic atherosclerosis. No enlarged abdominal or pelvic lymph nodes. Reproductive: Prostate gland is enlarged, measuring 5.6 cm diameter. Other: No free air or free fluid in the abdomen. Abdominal wall musculature  appears intact. Musculoskeletal: Degenerative changes in the spine. Old rib fractures and old fracture deformities of lumbar transverse processes. Degenerative changes in the hips. No acute destructive bone lesion. IMPRESSION: 1. Atelectasis in the lung bases. 2. Unchanged appearance of known focal liver lesion. 3. No evidence of bowel obstruction or inflammation. 4. Aortic atherosclerosis. 5. Enlarged prostate gland. 6. Nonobstructing stone in the right kidney. Electronically Signed   By: Boyce Byes M.D.   On: 05/13/2023 19:08    Procedures Procedures    Medications Ordered in ED Medications  ondansetron  (ZOFRAN ) injection 4 mg (4 mg Intravenous Given 05/13/23 1709)  lactated ringers  bolus 1,000 mL (0 mLs Intravenous Stopped 05/13/23 1841)  morphine  (PF) 4 MG/ML injection 4 mg (4 mg Intravenous Given 05/13/23 1709)  iohexol  (OMNIPAQUE ) 300 MG/ML solution 100 mL (100 mLs Intravenous Contrast Given 05/13/23 1756)    ED Course/ Medical Decision Making/ A&P                          Medical Decision Making Amount and/or Complexity of Data Reviewed Labs: ordered. Decision-making details documented in ED Course. Radiology: ordered. Decision-making details documented in ED Course.  Risk Prescription drug management.    This patient presents to the ED for concern of abdominal pain, nausea vomiting, this involves an extensive number of treatment options, and is a complaint that carries with it a high risk of complications and morbidity.  I considered the following differential and admission for this acute, potentially life threatening condition.   MDM:    For DDX for abdominal pain includes but is not limited to:  Abdominal exam without peritoneal signs. No evidence of acute abdomen at this time.  Low suspicion for acute hepatobiliary disease (including acute cholecystitis or cholangitis), acute pancreatitis, PUD (including gastric perforation), acute appendicitis, vascular catastrophe, bowel  obstruction, viscus perforation, or diverticulitis. CT is reassuring. Given Ivfluids,  analgesia and antiemetics and improves in the ED. Possibly he had chemotherapy-associated N/V from his treatment yesterday.    Clinical Course as of 05/13/23 2116  Thu May 13, 2023  1806 WBC: 5.9 No leukocytosis  [HN]  1806 Lactic Acid, Venous: 1.7 neg [HN]  1806 Troponin I (High Sensitivity)(!): 50 Mildly elevated c/w priors, will get delta [HN]  1806 Lipase(!): 107 Mildly elevated [HN]  1936 CT ABDOMEN PELVIS W CONTRAST 1. Atelectasis in the lung bases. 2. Unchanged appearance of known focal liver lesion. 3. No evidence of bowel obstruction or inflammation. 4. Aortic atherosclerosis. 5. Enlarged prostate gland. 6. Nonobstructing stone in the right kidney.  [HN]  2006 Troponin I (High Sensitivity)(!): 54 Flat [HN]  2010 Patient's workup is reassuring.  He has negative CT abdomen  pelvis as well as troponin.  He has tolerated p.o. in the department, states he feels much better and would like to be discharged.  Patient is instructed to follow-up with his primary care physician within 1 week.  Instructed to stay well-hydrated.  Prescribe Zofran  ODT Rx. [HN]    Clinical Course User Index [HN] Merdis Stalling, MD    Labs: I Ordered, and personally interpreted labs.  The pertinent results include: Those listed above  Imaging Studies ordered: I ordered imaging studies including CT and pelvis with contrast I independently visualized and interpreted imaging. I agree with the radiologist interpretation  Additional history obtained from chart review, sister at bedside.    Reevaluation: After the interventions noted above, I reevaluated the patient and found that they have :improved  Social Determinants of Health: [place] independently  Disposition:  DC w/ discharge instructions/return precautions. All questions answered to patient's satisfaction.    Co morbidities that complicate the patient  evaluation  Past Medical History:  Diagnosis Date   Arthritis    BPH (benign prostatic hyperplasia)    Difficult intubation    HOH (hard of hearing)    Hyperlipidemia 11/02/2017   Hypertension    Hypertension 11/02/2017   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HCC)    2014   Vitamin D deficiency 11/02/2017     Medicines Meds ordered this encounter  Medications   ondansetron  (ZOFRAN ) injection 4 mg   lactated ringers  bolus 1,000 mL   morphine  (PF) 4 MG/ML injection 4 mg    Refill:  0   iohexol  (OMNIPAQUE ) 300 MG/ML solution 100 mL   ondansetron  (ZOFRAN -ODT) 4 MG disintegrating tablet    Sig: Take 1 tablet (4 mg total) by mouth every 8 (eight) hours as needed for nausea or vomiting.    Dispense:  20 tablet    Refill:  0    I have reviewed the patients home medicines and have made adjustments as needed  Problem List / ED Course: Problem List Items Addressed This Visit   None Visit Diagnoses       Nausea and vomiting, unspecified vomiting type    -  Primary                   This note was created using dictation software, which may contain spelling or grammatical errors.    Merdis Stalling, MD 05/13/23 2117

## 2023-05-13 NOTE — Telephone Encounter (Signed)
 Patient scheduled for nutrition follow-up via telephone. 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 Unable to leave message with sister, Aurora Blowers. Left VM with request for return call for sister, Lagretta. Contact information provided.

## 2023-05-18 NOTE — Progress Notes (Signed)
 [facility] [address], [place] [address]    Clinic Day:  05/19/2023  Referring physician: Omie Bickers, MD  Patient Care Team: Omie Bickers, MD as PCP - General (Internal Medicine) [name], MD as Consulting Physician (Urology) [name], MD as Medical Oncologist (Medical Oncology) [name], RN as Oncology Nurse Navigator (Medical Oncology)   ASSESSMENT & PLAN:   Assessment: 1.  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  Enlarged contralateral right submandibular node.  Enlargement of the left submandibular gland probably reactive.  No abscess. - Biopsy (07/18/2021) floor of the mouth: Invasive well to moderately differentiated keratinizing squamous cell carcinoma.  Tumor cells negative for p16. - 10/14/2021: Floor of the mouth resection, tracheostomy, bilateral selective neck dissections zones 1-3 by Dr. Tellis Feathers - Pathology: Invasive moderately differentiated keratinizing SCC, 4.1 cm, carcinoma invades for a depth of about 1.9 cm and involves saliva gland tissue.  Anterior, posterior, right, left resection margins are involved.  Deep resection margin is negative.  Metastatic carcinoma 1/3 level 1 lymph nodes.  3 level 2 and 3 level 3 lymph nodes negative for carcinoma.  0/10 lymph nodes involved in the left side neck zone 1, 2, 3 dissection.  ENE not identified.  LVI/perineural invasion not identified. - I have talked to pathologist.  Reexcision margins were considered negative.  He has a high risk feature which is T4 tumor. -  PET scan on 12/12/2021: Soft tissue thickening and calcification along the ventral aspect of the trachea with associated hypermetabolism corresponding to recent tracheostomy site.  7 mm right  paratracheal lymph node new with SUV 2.4.  Hypermetabolism along the dome of the right hepatic lobe with probable 2.4 cm low-attenuation lesion - Liver lesion biopsy (01/28/2022): Metastatic squamous cell carcinoma, keratinizing. - NGS testing: PD-L1 (22 C3): CPS: 100, CD274 (PD-L1) amplified, JAK2 amplified, PIK3CA pathogenic variant exon 10, T p53 pathogenic variant, MS-stable, TMB-low - Per Dr. Tellis Feathers, he also has local recurrence of disease in the floor of the mouth. - Keytruda  from 03/10/2022 through 08/24/2022 with progression - XRT to the floor of the mouth on 06/29/2022 - PET scan 09/10/2022: Liver lesion increased in size measures 3.1 x 2.9 cm, previously 2.6 x 1.7 cm.  Right cervical lymph node meta stasis, mildly progressive. - HER2 by IHC 0 on biopsy from 01/28/2022. - Weekly cetuximab  started on 10/07/2022, discontinued on 04/14/2023 due to progression - PET scan (12/24/2022): Hypermetabolic anterior floor of mouth mass stable.  Stable right level 1B nodal metastasis.  Hypermetabolic 4.6 cm right liver dome metastasis increased in size by 8 mm.  No new sites of hypermetabolic metastatic disease. - MRI liver (01/07/2023): Right hepatic liver lesion measures 4.8 x 3.3 cm.  No new hepatic lesions seen. - Weekly docetaxel  started on 04/14/2023   2.  Social/family history: - He lives in his apartment by himself and is independent of ADLs and IADLs.  He does not drive.  He worked as a Curator and several other jobs.  Quit smoking 1 month ago.  He smoked 1 pack/week for more than 50 years. - 1 brother had agent orange related cancer.  Another brother also had cancer, type unknown to the patient.   3.  Stage Ib pure seminoma: - Status post  right radical orchiectomy on 12/20/2012. - He was on close surveillance rather than adjuvant chemotherapy.    Plan: 1.  Stage IV (PT4PN1 M1) SCC of the floor of the mouth, p16 negative: - We are giving him docetaxel  weekly, 2 weeks on/1 week off.  Last treatment  was on 05/05/2023 and 05/12/2023. - Today he was seen as he was unable to eat or swallow. - We have done goals of care discussion previously.  He chose to continue treatment rather than hospice.  We have changed his treatments to 2 weeks on/1 week off. - He was evaluated in the ER on 05/13/2023 with nausea and vomiting.  A CTAP done showed stable liver lesion. - I will switch his antinausea medication to Aloxi from Zofran  starting next cycle. - We will hold his treatment until he has PEG tube.  I will give him fluids with electrolytes today.  RTC 2 weeks for follow-up.   2.  Oral pain: - Continue liquid hydrocodone  twice daily as needed.   3.  Weight loss: - He is not able to eat due to decreased appetite and difficulty swallowing.  Even Ensure feels very thick for him to swallow. - We had discussion about PEG tube placement.  Patient and his sisters are agreeable.  Will make referral to Dr. Larrie Po.  In the interim I have recommended that he dilute Ensure with milk or water  to be able to swallow easily.   4.  Hypomagnesemia: - We have discontinued magnesium .  Magnesium  is normal today.   5.  Hypercalcemia: - Calcium is 9.1.  Last corrected calcium is between 10-11.  He is receiving fluids today.    No orders of the defined types were placed in this encounter.     Nathaniel Hicks,acting as a Neurosurgeon for [name], MD.,have documented all relevant documentation on the behalf of [name], MD,as directed by  [name], MD while in the presence of [name], MD.  I, [name] MD, have reviewed the above documentation for accuracy and completeness, and I agree with the above.    [name], MD   5/7/20251:17 PM  CHIEF COMPLAINT:   Diagnosis: squamous cell carcinoma of the floor of the mouth, history of testicular seminoma    Cancer Staging  Cancer of floor of mouth [facility]) Staging form: Oral Cavity, AJCC 8th Edition -  Clinical stage from 11/25/2021: Stage IVC (cT4a, cN1, pM1) - Signed by [name], MD on 02/04/2022  Testicle cancer, right Staging form: Testis, AJCC 7th Edition - Clinical: Stage I (T2, N0, M0) - Signed by Doretta Gant, PA-C on 07/23/2013    Prior Therapy: 1. Pembrolizumab , 03/10/22 - 08/24/22 2. XRT to floor of mouth, 06/17/22 - 08/19/22 3. SBRT to liver lesion, 03/15/23 - 03/26/23 4. Cetuximab  weekly, 10/07/22 - 04/14/23  Current Therapy:  docetaxel  weekly    HISTORY OF PRESENT ILLNESS:   Oncology History  Testicle cancer, right  12/20/2012 Surgery   Right total orchiectomyt- 1. Testis, biopsy, right - SEMINOMA. PLEASE SEE COMMENT. 2. Testis, tumor, right - SEMINOMA, 7.5 CM. - ANGIOLYMPHATIC INVASION PRESENT. - RESECTION MARGINS, NEGATIVE FOR ATYPIA OR MALIGNANCY.   01/18/2013 PET scan   Status post right orchiectomy. No findings specific for metastatic disease. Small para-aortic nodes measuring up to 5 mm short axis, without convincing hypermetabolism. Given location, attention on follow-up is suggested.   04/17/2013 Imaging   CT CAP- No evidence of metastatic disease in the chest, abdomen or pelvis. Proximal LAD coronary artery  calcification.   07/20/2013 Imaging   CT abd/pelvis- No evidence of metastatic disease in the abdomen or pelvis.   10/24/2013 Imaging   CT abd/pelvis- No findings to suggest metastatic disease in the abdomen or pelvis   10/24/2013 Imaging   Chest xray- Probable COPD.  Negative for metastatic disease.   02/21/2014 Imaging   CT abd/pelvis- Stable abdominal pelvic CT status post right orchectomy. No evidence of adenopathy or other metastatic disease.   02/21/2014 Imaging   Chest xray- Left lower lobe mild atelectasis and/or infiltrate.     09/10/2014 Imaging   CT abd/pelvis- Status post right orchiectomy.   No evidence of metastatic disease.   3 mm nonobstructing right upper pole renal calculus. No hydronephrosis.   03/14/2015 Imaging   CT abd/pelvis-  Stable exam. No evidence of metastatic disease or other acute findings within the abdomen or pelvis.   09/13/2015 Imaging   CT abd/pelvis- No acute findings and no evidence for mass or adenopathy.    04/24/2016 Imaging   CT abd/pelvis: IMPRESSION: 1. Stable exam. No new or progressive findings. No features to suggest metastatic disease.   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HCC)  10/14/2021 Initial Diagnosis   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HCC)   11/25/2021 Cancer Staging   Staging form: Oral Cavity, AJCC 8th Edition - Clinical stage from 11/25/2021: Stage IVC (cT4a, cN1, pM1) - Signed by [name], MD on 02/04/2022 Histopathologic type: Squamous cell carcinoma, NOS Stage prefix: Initial diagnosis Histologic grade (G): G2 Histologic grading system: 3 grade system   03/10/2022 - 08/24/2022 Chemotherapy   Patient is on Treatment Plan : HEAD/NECK Pembrolizumab  (200) q21d     10/07/2022 - 03/31/2023 Chemotherapy   Patient is on Treatment Plan : HEAD/NECK Cetuximab  q7d     04/14/2023 -  Chemotherapy   Patient is on Treatment Plan : Head and Neck Docetaxel  (35) q7d        INTERVAL HISTORY:   Nathaniel Hicks is a 83 y.o. male presenting to clinic today for follow up of squamous cell carcinoma of the floor of the mouth, history of testicular seminoma. He was last seen by me on 05/12/23.  Since his last visit, he presented to the ED on 05/13/23 for nausea and vomiting, treated with IV fluids, analgesia, and antiemetics.   Today, he states that he is doing well overall. His appetite level is at 10%. His energy level is at 10%.  PAST MEDICAL HISTORY:   Past Medical History: Past Medical History:  Diagnosis Date   Arthritis    BPH (benign prostatic hyperplasia)    Difficult intubation    HOH (hard of hearing)    Hyperlipidemia 11/02/2017   Hypertension    Hypertension 11/02/2017   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HCC)    2014   Vitamin D deficiency 11/02/2017    Surgical History: Past Surgical History:   Procedure Laterality Date   COLONOSCOPY N/A 11/24/2012   Procedure: COLONOSCOPY;  Surgeon: Ruby Corporal, MD;  Location: [facility];  Service: Endoscopy;  Laterality: N/A;  830-moved to 730 Ann notified pt   FLOOR OF MOUTH BIOPSY N/A 10/14/2021   Procedure: FLOOR OF MOUTH RESECTION;  Surgeon: Virgina Grills, MD;  Location: [facility] OR;  Service: ENT;  Laterality: N/A;   HEMORROIDECTOMY     KNEE ARTHROSCOPY WITH LATERAL MENISECTOMY Right 08/31/2017   Procedure: KNEE ARTHROSCOPY WITH LATERAL MENISECTOMY;  Surgeon: Darrin Emerald, MD;  Location: [facility] ORS;  Service: Orthopedics;  Laterality: Right;   LESION EXCISION N/A 03/23/2012  Procedure: EXCISION NEOPLASM SCALP ;  Surgeon: Beau Bound, MD;  Location: [facility] ORS;  Service: General;  Laterality: N/A;  Excision of Scalp Neoplasm   ORCHIECTOMY Right 12/20/2012   Procedure: RIGHT RADICAL ORCHIECTOMY/POSSIBLE BX RIGHT TESTICLE;  Surgeon: Reggie Caper, MD;  Location: [facility];  Service: Urology;  Laterality: Right;   PORTACATH PLACEMENT Left 03/25/2022   Procedure: INSERTION PORT-A-CATH;  Surgeon: Alanda Allegra, MD;  Location: [facility];  Service: General;  Laterality: Left;   PROSTATE SURGERY     RADICAL NECK DISSECTION Bilateral 10/14/2021   Procedure: NECK DISSECTION;  Surgeon: Virgina Grills, MD;  Location: [facility] OR;  Service: ENT;  Laterality: Bilateral;   SCALP LACERATION REPAIR     [facility]-Dr Felipe Horton   SKIN FULL THICKNESS GRAFT Bilateral 10/14/2021   Procedure: PLATYSMA FLAP CLOSURE;  Surgeon: Virgina Grills, MD;  Location: [facility] OR;  Service: ENT;  Laterality: Bilateral;   TOOTH EXTRACTION  10/14/2021   Procedure: DENTAL EXTRACTIONS;  Surgeon: Virgina Grills, MD;  Location: [facility] OR;  Service: ENT;;   TRACHEOSTOMY TUBE PLACEMENT N/A 10/14/2021   Procedure: TRACHEOSTOMY;  Surgeon: Virgina Grills, MD;  Location: [facility] OR;  Service: ENT;  Laterality: N/A;    Social History: Social History   Socioeconomic History   Marital status: Single    Spouse name:  Not on file   Number of children: Not on file   Years of education: Not on file   Highest education level: Not on file  Occupational History   Not on file  Tobacco Use   Smoking status: Former    Current packs/day: 0.25    Average packs/day: 0.3 packs/day for 50.0 years (12.5 ttl pk-yrs)    Types: Cigarettes   Smokeless tobacco: Never  Vaping Use   Vaping status: Never Used  Substance and Sexual Activity   Alcohol use: Not Currently   Drug use: No   Sexual activity: Yes    Birth control/protection: None  Other Topics Concern   Not on file  Social History Narrative   Not on file   Social Drivers of Health   Financial Resource Strain: Not on file  Food Insecurity: Not on file  Transportation Needs: Not on file  Physical Activity: Not on file  Stress: Not on file  Social Connections: Not on file  Intimate Partner Violence: Not on file    Family History: Family History  Problem Relation Age of Onset   Cancer Brother    Cancer Brother     Current Medications:  Current Outpatient Medications:    acetaminophen  (TYLENOL ) 650 MG CR tablet, Take 650 mg by mouth every 8 (eight) hours as needed for pain., Disp: , Rfl:    amLODipine -olmesartan  (AZOR ) 5-20 MG tablet, TAKE 1 TABLET BY MOUTH DAILY, Disp: 90 tablet, Rfl: 0   budesonide -formoterol  (SYMBICORT ) 80-4.5 MCG/ACT inhaler, Inhale 2 puffs into the lungs in the morning and at bedtime., Disp: 1 each, Rfl: 12   chlorhexidine  (PERIDEX ) 0.12 % solution, Use as directed 5 mLs in the mouth or throat 2 (two) times daily., Disp: , Rfl:    Cholecalciferol (VITAMIN D3) 50 MCG (2000 UT) TABS, Take 2,000 Units by mouth daily., Disp: , Rfl:    ciprofloxacin-dexamethasone  (CIPRODEX) OTIC suspension, Place 4 drops into the left ear 2 (two) times daily., Disp: , Rfl:    clindamycin  (CLINDAGEL) 1 % gel, Apply topically 2 (two) times daily. Apply twice daily to chest rash, Disp: 30 g, Rfl: 0   doxycycline  (VIBRA -TABS) 100 MG tablet, TAKE  1  TABLET(100 MG) BY MOUTH TWICE DAILY, Disp: 60 tablet, Rfl: 3   HYDROcodone -acetaminophen  (HYCET) 7.5-325 mg/15 ml solution, Take 15 mLs by mouth every 6 (six) hours as needed for moderate pain (pain score 4-6)., Disp: 473 mL, Rfl: 0   lidocaine  (XYLOCAINE ) 2 % solution, Take by mouth., Disp: , Rfl:    magnesium  oxide (MAG-OX) 400 MG tablet, Take 2 tablets (800 mg total) by mouth 2 (two) times daily., Disp: 120 tablet, Rfl: 3   megestrol  (MEGACE ) 400 MG/10ML suspension, Take 10 mLs (400 mg total) by mouth 2 (two) times daily., Disp: 480 mL, Rfl: 3   Menthol, Topical Analgesic, (BIOFREEZE EX), Apply 1 application  topically daily as needed (pain)., Disp: , Rfl:    naloxone  (NARCAN ) nasal spray 4 mg/0.1 mL, SMARTSIG:Both Nares, Disp: , Rfl:    ondansetron  (ZOFRAN -ODT) 4 MG disintegrating tablet, Take 1 tablet (4 mg total) by mouth every 8 (eight) hours as needed for nausea or vomiting., Disp: 20 tablet, Rfl: 0   Polyethyl Glycol-Propyl Glycol (GOODSENSE LUBRICANT EYE DROPS OP), Place 1 drop into both eyes daily as needed (dry eyes)., Disp: , Rfl:    pravastatin  (PRAVACHOL ) 80 MG tablet, Take 80 mg by mouth daily., Disp: , Rfl:    RAPAFLO 8 MG CAPS capsule, Take 8 mg by mouth daily. Silodosin, Disp: , Rfl:    vitamin B-12 (CYANOCOBALAMIN ) 500 MCG tablet, Take 500 mcg by mouth daily., Disp: , Rfl:    Allergies: No Known Allergies  REVIEW OF SYSTEMS:   Review of Systems  Constitutional:  Negative for chills, fatigue and fever.  HENT:   Negative for lump/mass, mouth sores, nosebleeds, sore throat and trouble swallowing.   Eyes:  Negative for eye problems.  Respiratory:  Negative for cough and shortness of breath.   Cardiovascular:  Negative for chest pain, leg swelling and palpitations.  Gastrointestinal:  Negative for abdominal pain, constipation, diarrhea, nausea and vomiting.  Genitourinary:  Negative for bladder incontinence, difficulty urinating, dysuria, frequency, hematuria and nocturia.    Musculoskeletal:  Negative for arthralgias, back pain, flank pain, myalgias and neck pain.  Skin:  Negative for itching and rash.  Neurological:  Negative for dizziness, headaches and numbness.  Hematological:  Does not bruise/bleed easily.  Psychiatric/Behavioral:  Negative for depression, sleep disturbance and suicidal ideas. The patient is not nervous/anxious.   All other systems reviewed and are negative.    VITALS:   There were no vitals taken for this visit.  Wt Readings from Last 3 Encounters:  05/19/23 137 lb 4.8 oz (62.3 kg)  05/13/23 139 lb 5.3 oz (63.2 kg)  05/12/23 139 lb 5.3 oz (63.2 kg)    There is no height or weight on file to calculate BMI.  Performance status (ECOG): 1 - Symptomatic but completely ambulatory  PHYSICAL EXAM:   Physical Exam Vitals and nursing note reviewed. Exam conducted with a chaperone present.  Constitutional:      Appearance: Normal appearance.  Cardiovascular:     Rate and Rhythm: Normal rate and regular rhythm.     Pulses: Normal pulses.     Heart sounds: Normal heart sounds.  Pulmonary:     Effort: Pulmonary effort is normal.     Breath sounds: Normal breath sounds.  Abdominal:     Palpations: Abdomen is soft. There is no hepatomegaly, splenomegaly or mass.     Tenderness: There is no abdominal tenderness.  Musculoskeletal:     Right lower leg: No edema.     Left  lower leg: No edema.  Lymphadenopathy:     Cervical: No cervical adenopathy.     Right cervical: No superficial, deep or posterior cervical adenopathy.    Left cervical: No superficial, deep or posterior cervical adenopathy.     Upper Body:     Right upper body: No supraclavicular or axillary adenopathy.     Left upper body: No supraclavicular or axillary adenopathy.  Neurological:     General: No focal deficit present.     Mental Status: He is alert and oriented to person, place, and time.  Psychiatric:        Mood and Affect: Mood normal.        Behavior:  Behavior normal.     LABS:   CBC     Component Value Date/Time   WBC 4.1 05/19/2023 1004   RBC 3.03 (L) 05/19/2023 1004   HGB 10.2 (L) 05/19/2023 1004   HCT 30.4 (L) 05/19/2023 1004   PLT 271 05/19/2023 1004   MCV 100.3 (H) 05/19/2023 1004   MCH 33.7 05/19/2023 1004   MCHC 33.6 05/19/2023 1004   RDW 15.5 05/19/2023 1004   LYMPHSABS 0.4 (L) 05/19/2023 1004   MONOABS 0.6 05/19/2023 1004   EOSABS 0.0 05/19/2023 1004   BASOSABS 0.0 05/19/2023 1004    CMP      Component Value Date/Time   NA 134 (L) 05/19/2023 1004   K 3.7 05/19/2023 1004   CL 102 05/19/2023 1004   CO2 22 05/19/2023 1004   GLUCOSE 123 (H) 05/19/2023 1004   BUN 11 05/19/2023 1004   CREATININE 0.84 05/19/2023 1004   CALCIUM 9.1 05/19/2023 1004   PROT 6.2 (L) 05/19/2023 1004   ALBUMIN 2.6 (L) 05/19/2023 1004   AST 41 05/19/2023 1004   ALT 30 05/19/2023 1004   ALKPHOS 59 05/19/2023 1004   BILITOT 1.0 05/19/2023 1004   GFRNONAA >60 05/19/2023 1004   GFRAA >60 10/26/2018 1137     Lab Results  Component Value Date   CEA 2.2 01/19/2013   /  CEA  Date Value Ref Range Status  01/19/2013 2.2 0.0 - 5.0 ng/mL Final    Comment:    Performed at [facility]   No results found for: "PSA1" No results found for: "CAN199" No results found for: "CAN125"  No results found for: "TOTALPROTELP", "ALBUMINELP", "A1GS", "A2GS", "BETS", "BETA2SER", "GAMS", "MSPIKE", "SPEI" Lab Results  Component Value Date   TIBC 244 (L) 08/04/2022   TIBC 251 05/12/2022   FERRITIN 273 08/04/2022   FERRITIN 195 05/12/2022   IRONPCTSAT 19 08/04/2022   IRONPCTSAT 18 05/12/2022   Lab Results  Component Value Date   LDH 172 11/12/2020   LDH 175 10/26/2018   LDH 179 10/25/2013     STUDIES:   CT ABDOMEN PELVIS W CONTRAST Result Date: 05/13/2023 CLINICAL DATA:  Acute nonlocalized abdominal pain. Vomiting beginning last night. Vomiting after discharge from cancer treatment. EXAM: CT ABDOMEN AND PELVIS WITH CONTRAST  TECHNIQUE: Multidetector CT imaging of the abdomen and pelvis was performed using the standard protocol following bolus administration of intravenous contrast. RADIATION DOSE REDUCTION: This exam was performed according to the departmental dose-optimization program which includes automated exposure control, adjustment of the mA and/or kV according to patient size and/or use of iterative reconstruction technique. CONTRAST:  OMNIPAQUE  IOHEXOL  300 MG/ML  SOLN COMPARISON:  PET-CT 04/08/2023.  MRI abdomen 01/07/2023 FINDINGS: Lower chest: Atelectasis or infiltration in the lung bases. Mild cardiac enlargement. Hepatobiliary: Focal heterogeneously hypoenhancing lesion in the  right lobe of the liver measuring 5.4 x 6 cm. This corresponds to known neoplasm. Gallbladder and bile ducts are normal. Pancreas: Unremarkable. No pancreatic ductal dilatation or surrounding inflammatory changes. Spleen: Normal in size without focal abnormality. Adrenals/Urinary Tract: No adrenal gland nodules. Symmetrical nephrograms. Stone in the lower pole right kidney measuring 5 mm diameter. No hydronephrosis or hydroureter. Bilateral renal cysts are unchanged since prior study. No imaging follow-up is indicated. Bladder is normal. Stomach/Bowel: Stomach, small bowel, and colon are not abnormally distended. No wall thickening or inflammatory changes. Stool throughout the colon. Appendix is not identified. Vascular/Lymphatic: Aortic atherosclerosis. No enlarged abdominal or pelvic lymph nodes. Reproductive: Prostate gland is enlarged, measuring 5.6 cm diameter. Other: No free air or free fluid in the abdomen. Abdominal wall musculature appears intact. Musculoskeletal: Degenerative changes in the spine. Old rib fractures and old fracture deformities of lumbar transverse processes. Degenerative changes in the hips. No acute destructive bone lesion. IMPRESSION: 1. Atelectasis in the lung bases. 2. Unchanged appearance of known focal liver  lesion. 3. No evidence of bowel obstruction or inflammation. 4. Aortic atherosclerosis. 5. Enlarged prostate gland. 6. Nonobstructing stone in the right kidney. Electronically Signed   By: Boyce Byes M.D.   On: 05/13/2023 19:08

## 2023-05-19 ENCOUNTER — Inpatient Hospital Stay: Admitting: Hematology

## 2023-05-19 ENCOUNTER — Inpatient Hospital Stay

## 2023-05-19 ENCOUNTER — Inpatient Hospital Stay: Attending: Hematology | Admitting: Hematology

## 2023-05-19 VITALS — BP 109/54 | HR 118 | Temp 98.6°F | Resp 20

## 2023-05-19 DIAGNOSIS — Z9079 Acquired absence of other genital organ(s): Secondary | ICD-10-CM | POA: Insufficient documentation

## 2023-05-19 DIAGNOSIS — C01 Malignant neoplasm of base of tongue: Secondary | ICD-10-CM

## 2023-05-19 DIAGNOSIS — K1379 Other lesions of oral mucosa: Secondary | ICD-10-CM | POA: Diagnosis not present

## 2023-05-19 DIAGNOSIS — C049 Malignant neoplasm of floor of mouth, unspecified: Secondary | ICD-10-CM

## 2023-05-19 DIAGNOSIS — Z95828 Presence of other vascular implants and grafts: Secondary | ICD-10-CM

## 2023-05-19 DIAGNOSIS — Z87891 Personal history of nicotine dependence: Secondary | ICD-10-CM | POA: Insufficient documentation

## 2023-05-19 DIAGNOSIS — C787 Secondary malignant neoplasm of liver and intrahepatic bile duct: Secondary | ICD-10-CM | POA: Insufficient documentation

## 2023-05-19 DIAGNOSIS — Z8547 Personal history of malignant neoplasm of testis: Secondary | ICD-10-CM | POA: Insufficient documentation

## 2023-05-19 DIAGNOSIS — R634 Abnormal weight loss: Secondary | ICD-10-CM | POA: Insufficient documentation

## 2023-05-19 LAB — CBC WITH DIFFERENTIAL/PLATELET
Abs Immature Granulocytes: 0.05 10*3/uL (ref 0.00–0.07)
Basophils Absolute: 0 10*3/uL (ref 0.0–0.1)
Basophils Relative: 1 %
Eosinophils Absolute: 0 10*3/uL (ref 0.0–0.5)
Eosinophils Relative: 0 %
HCT: 30.4 % — ABNORMAL LOW (ref 39.0–52.0)
Hemoglobin: 10.2 g/dL — ABNORMAL LOW (ref 13.0–17.0)
Immature Granulocytes: 1 %
Lymphocytes Relative: 10 %
Lymphs Abs: 0.4 10*3/uL — ABNORMAL LOW (ref 0.7–4.0)
MCH: 33.7 pg (ref 26.0–34.0)
MCHC: 33.6 g/dL (ref 30.0–36.0)
MCV: 100.3 fL — ABNORMAL HIGH (ref 80.0–100.0)
Monocytes Absolute: 0.6 10*3/uL (ref 0.1–1.0)
Monocytes Relative: 14 %
Neutro Abs: 3.1 10*3/uL (ref 1.7–7.7)
Neutrophils Relative %: 74 %
Platelets: 271 10*3/uL (ref 150–400)
RBC: 3.03 MIL/uL — ABNORMAL LOW (ref 4.22–5.81)
RDW: 15.5 % (ref 11.5–15.5)
WBC: 4.1 10*3/uL (ref 4.0–10.5)
nRBC: 0.5 % — ABNORMAL HIGH (ref 0.0–0.2)

## 2023-05-19 LAB — COMPREHENSIVE METABOLIC PANEL WITH GFR
ALT: 30 U/L (ref 0–44)
AST: 41 U/L (ref 15–41)
Albumin: 2.6 g/dL — ABNORMAL LOW (ref 3.5–5.0)
Alkaline Phosphatase: 59 U/L (ref 38–126)
Anion gap: 10 (ref 5–15)
BUN: 11 mg/dL (ref 8–23)
CO2: 22 mmol/L (ref 22–32)
Calcium: 9.1 mg/dL (ref 8.9–10.3)
Chloride: 102 mmol/L (ref 98–111)
Creatinine, Ser: 0.84 mg/dL (ref 0.61–1.24)
GFR, Estimated: >60 mL/min (ref >60)
Glucose, Bld: 123 mg/dL — ABNORMAL HIGH (ref 70–99)
Potassium: 3.7 mmol/L (ref 3.5–5.1)
Sodium: 134 mmol/L — ABNORMAL LOW (ref 135–145)
Total Bilirubin: 1 mg/dL (ref 0.0–1.2)
Total Protein: 6.2 g/dL — ABNORMAL LOW (ref 6.5–8.1)

## 2023-05-19 LAB — MAGNESIUM: Magnesium: 1.8 mg/dL (ref 1.7–2.4)

## 2023-05-19 MED ORDER — SODIUM CHLORIDE 0.9% FLUSH
10.0000 mL | INTRAVENOUS | Status: DC | PRN
  Administered 2023-05-19: 10 mL via INTRAVENOUS

## 2023-05-19 MED ORDER — SODIUM CHLORIDE FLUSH 0.9 % IV SOLN
10.0000 mL | Freq: Once | INTRAVENOUS | Status: AC
  Administered 2023-05-19: 10 mL via INTRAVENOUS
  Filled 2023-05-19: qty 10

## 2023-05-19 MED ORDER — POTASSIUM CHLORIDE IN NACL 20-0.9 MEQ/L-% IV SOLN
Freq: Once | INTRAVENOUS | Status: AC
Start: 2023-05-19 — End: 2023-05-19
  Filled 2023-05-19: qty 1000

## 2023-05-19 MED ORDER — MAGNESIUM SULFATE 2 GM/50ML IV SOLN
2.0000 g | Freq: Once | INTRAVENOUS | Status: AC
Start: 2023-05-19 — End: 2023-05-19
  Administered 2023-05-19: 2 g via INTRAVENOUS
  Filled 2023-05-19: qty 50

## 2023-05-19 MED ORDER — HEPARIN SOD (PORK) LOCK FLUSH 100 UNIT/ML IV SOLN
500.0000 [IU] | Freq: Once | INTRAVENOUS | Status: AC
  Administered 2023-05-19: 500 [IU] via INTRAVENOUS

## 2023-05-19 NOTE — Progress Notes (Signed)
 Patient presents today for possible fluids per provider's order. Vital signs stable and patient c/o weakness and problems swallowing. Dr.Katragadda aware and stated to give house fluids today.   Treatment given today per MD orders. Tolerated infusion without adverse affects. Vital signs stable. No complaints at this time. Discharged from clinic via wheelchair in stable condition. Alert and oriented x 3. F/U with [facility] as scheduled.

## 2023-05-19 NOTE — Patient Instructions (Signed)
 [facility] at [facility] Discharge Instructions   You were seen and examined today by Dr. Cheree Cords.  He reviewed the results of your lab work which are normal/stable.   We will proceed with IVF today. We will make a referral to Dr. Larrie Po for feeding tube placement.   Return as scheduled.    Thank you for choosing [facility] at [facility] to provide your oncology and hematology care.  To afford each patient quality time with our provider, please arrive at least 15 minutes before your scheduled appointment time.   If you have a lab appointment with the Cancer Center please come in thru the Main Entrance and check in at the main information desk.  You need to re-schedule your appointment should you arrive 10 or more minutes late.  We strive to give you quality time with our providers, and arriving late affects you and other patients whose appointments are after yours.  Also, if you no show three or more times for appointments you may be dismissed from the clinic at the providers discretion.     Again, thank you for choosing [facility].  Our hope is that these requests will decrease the amount of time that you wait before being seen by our physicians.       _____________________________________________________________  Should you have questions after your visit to [facility], please contact our office at [phone] and follow the prompts.  Our office hours are 8:00 a.m. and 4:30 p.m. Monday - Friday.  Please note that voicemails left after 4:00 p.m. may not be returned until the following business day.  We are closed weekends and major holidays.  You do have access to a nurse 24-7, just call the main number to the clinic [phone] and do not press any options, hold on the line and a nurse will answer the phone.    For prescription refill requests, have your pharmacy contact our office and allow 72 hours.     Due to Covid, you will need to wear a mask upon entering the hospital. If you do not have a mask, a mask will be given to you at the Main Entrance upon arrival. For doctor visits, patients may have 1 support person age 33 or older with them. For treatment visits, patients can not have anyone with them due to social distancing guidelines and our immunocompromised population.

## 2023-05-19 NOTE — Patient Instructions (Signed)
 [facility] [place] - A DEPT OF [place] [facility]  Discharge Instructions: Thank you for choosing [facility] to provide your oncology and hematology care.  If you have a lab appointment with the Cancer Center - please note that after April 8th, 2024, all labs will be drawn in the cancer center.  You do not have to check in or register with the main entrance as you have in the past but will complete your check-in in the cancer center.  Wear comfortable clothing and clothing appropriate for easy access to any Portacath or PICC line.   We strive to give you quality time with your provider. You may need to reschedule your appointment if you arrive late (15 or more minutes).  Arriving late affects you and other patients whose appointments are after yours.  Also, if you miss three or more appointments without notifying the office, you may be dismissed from the clinic at the provider's discretion.      For prescription refill requests, have your pharmacy contact our office and allow 72 hours for refills to be completed.    Today you received house fluids    BELOW ARE SYMPTOMS THAT SHOULD BE REPORTED IMMEDIATELY: *FEVER GREATER THAN 100.4 F (38 C) OR HIGHER *CHILLS OR SWEATING *NAUSEA AND VOMITING THAT IS NOT CONTROLLED WITH YOUR NAUSEA MEDICATION *UNUSUAL SHORTNESS OF BREATH *UNUSUAL BRUISING OR BLEEDING *URINARY PROBLEMS (pain or burning when urinating, or frequent urination) *BOWEL PROBLEMS (unusual diarrhea, constipation, pain near the anus) TENDERNESS IN MOUTH AND THROAT WITH OR WITHOUT PRESENCE OF ULCERS (sore throat, sores in mouth, or a toothache) UNUSUAL RASH, SWELLING OR PAIN  UNUSUAL VAGINAL DISCHARGE OR ITCHING   Items with * indicate a potential emergency and should be followed up as soon as possible or go to the Emergency Department if any problems should occur.  Please show the CHEMOTHERAPY ALERT CARD or IMMUNOTHERAPY ALERT CARD at check-in to the  Emergency Department and triage nurse.  Should you have questions after your visit or need to cancel or reschedule your appointment, please contact [facility] [place] - A DEPT OF [facility] [phone]  and follow the prompts.  Office hours are 8:00 a.m. to 4:30 p.m. Monday - Friday. Please note that voicemails left after 4:00 p.m. may not be returned until the following business day.  We are closed weekends and major holidays. You have access to a nurse at all times for urgent questions. Please call the main number to the clinic [phone] and follow the prompts.  For any non-urgent questions, you may also contact your provider using MyChart. We now offer e-Visits for anyone 22 and older to request care online for non-urgent symptoms. For details visit [email or web address].   Also download the MyChart app! Go to the app store, search "MyChart", open the app, select [place], and log in with your MyChart username and password.

## 2023-05-20 ENCOUNTER — Other Ambulatory Visit: Payer: Self-pay

## 2023-05-26 ENCOUNTER — Inpatient Hospital Stay

## 2023-05-26 ENCOUNTER — Inpatient Hospital Stay: Admitting: Hematology

## 2023-05-26 DIAGNOSIS — R61 Generalized hyperhidrosis: Secondary | ICD-10-CM | POA: Diagnosis not present

## 2023-05-26 DIAGNOSIS — R531 Weakness: Secondary | ICD-10-CM | POA: Diagnosis not present

## 2023-05-26 DIAGNOSIS — Z87891 Personal history of nicotine dependence: Secondary | ICD-10-CM | POA: Diagnosis not present

## 2023-05-26 DIAGNOSIS — K668 Other specified disorders of peritoneum: Secondary | ICD-10-CM | POA: Diagnosis not present

## 2023-05-26 DIAGNOSIS — N200 Calculus of kidney: Secondary | ICD-10-CM | POA: Diagnosis not present

## 2023-05-26 DIAGNOSIS — R000 Tachycardia, unspecified: Secondary | ICD-10-CM | POA: Diagnosis not present

## 2023-05-26 DIAGNOSIS — R1312 Dysphagia, oropharyngeal phase: Secondary | ICD-10-CM | POA: Diagnosis not present

## 2023-05-26 DIAGNOSIS — E875 Hyperkalemia: Secondary | ICD-10-CM | POA: Diagnosis not present

## 2023-05-26 DIAGNOSIS — C069 Malignant neoplasm of mouth, unspecified: Secondary | ICD-10-CM | POA: Diagnosis not present

## 2023-05-26 DIAGNOSIS — D649 Anemia, unspecified: Secondary | ICD-10-CM | POA: Diagnosis not present

## 2023-05-26 DIAGNOSIS — K769 Liver disease, unspecified: Secondary | ICD-10-CM | POA: Diagnosis not present

## 2023-05-26 DIAGNOSIS — I499 Cardiac arrhythmia, unspecified: Secondary | ICD-10-CM | POA: Diagnosis not present

## 2023-05-26 DIAGNOSIS — R935 Abnormal findings on diagnostic imaging of other abdominal regions, including retroperitoneum: Secondary | ICD-10-CM | POA: Diagnosis not present

## 2023-05-26 DIAGNOSIS — J9811 Atelectasis: Secondary | ICD-10-CM | POA: Diagnosis not present

## 2023-05-26 DIAGNOSIS — Z7952 Long term (current) use of systemic steroids: Secondary | ICD-10-CM | POA: Diagnosis not present

## 2023-05-26 DIAGNOSIS — R112 Nausea with vomiting, unspecified: Secondary | ICD-10-CM | POA: Diagnosis not present

## 2023-05-26 DIAGNOSIS — R0902 Hypoxemia: Secondary | ICD-10-CM | POA: Diagnosis not present

## 2023-05-26 DIAGNOSIS — C029 Malignant neoplasm of tongue, unspecified: Secondary | ICD-10-CM | POA: Diagnosis not present

## 2023-05-26 DIAGNOSIS — I10 Essential (primary) hypertension: Secondary | ICD-10-CM | POA: Diagnosis not present

## 2023-05-26 DIAGNOSIS — C01 Malignant neoplasm of base of tongue: Secondary | ICD-10-CM | POA: Diagnosis not present

## 2023-05-26 DIAGNOSIS — C049 Malignant neoplasm of floor of mouth, unspecified: Secondary | ICD-10-CM | POA: Diagnosis not present

## 2023-05-26 DIAGNOSIS — Z930 Tracheostomy status: Secondary | ICD-10-CM | POA: Diagnosis not present

## 2023-05-26 DIAGNOSIS — K146 Glossodynia: Secondary | ICD-10-CM | POA: Diagnosis not present

## 2023-05-26 DIAGNOSIS — E785 Hyperlipidemia, unspecified: Secondary | ICD-10-CM | POA: Diagnosis not present

## 2023-05-26 DIAGNOSIS — R627 Adult failure to thrive: Secondary | ICD-10-CM | POA: Diagnosis not present

## 2023-05-26 DIAGNOSIS — M6281 Muscle weakness (generalized): Secondary | ICD-10-CM | POA: Diagnosis not present

## 2023-05-26 DIAGNOSIS — Z7951 Long term (current) use of inhaled steroids: Secondary | ICD-10-CM | POA: Diagnosis not present

## 2023-05-26 DIAGNOSIS — R1084 Generalized abdominal pain: Secondary | ICD-10-CM | POA: Diagnosis not present

## 2023-05-26 DIAGNOSIS — K255 Chronic or unspecified gastric ulcer with perforation: Secondary | ICD-10-CM | POA: Diagnosis not present

## 2023-05-26 DIAGNOSIS — R9431 Abnormal electrocardiogram [ECG] [EKG]: Secondary | ICD-10-CM | POA: Diagnosis not present

## 2023-05-26 DIAGNOSIS — R509 Fever, unspecified: Secondary | ICD-10-CM | POA: Diagnosis not present

## 2023-05-26 DIAGNOSIS — Z79899 Other long term (current) drug therapy: Secondary | ICD-10-CM | POA: Diagnosis not present

## 2023-05-26 DIAGNOSIS — K661 Hemoperitoneum: Secondary | ICD-10-CM | POA: Diagnosis not present

## 2023-05-26 DIAGNOSIS — I444 Left anterior fascicular block: Secondary | ICD-10-CM | POA: Diagnosis not present

## 2023-05-26 DIAGNOSIS — R109 Unspecified abdominal pain: Secondary | ICD-10-CM | POA: Diagnosis not present

## 2023-05-26 DIAGNOSIS — E860 Dehydration: Secondary | ICD-10-CM | POA: Diagnosis not present

## 2023-05-26 DIAGNOSIS — C787 Secondary malignant neoplasm of liver and intrahepatic bile duct: Secondary | ICD-10-CM | POA: Diagnosis not present

## 2023-05-26 DIAGNOSIS — R262 Difficulty in walking, not elsewhere classified: Secondary | ICD-10-CM | POA: Diagnosis not present

## 2023-05-27 DIAGNOSIS — R1310 Dysphagia, unspecified: Secondary | ICD-10-CM | POA: Diagnosis not present

## 2023-05-27 DIAGNOSIS — C049 Malignant neoplasm of floor of mouth, unspecified: Secondary | ICD-10-CM | POA: Diagnosis not present

## 2023-05-27 DIAGNOSIS — R627 Adult failure to thrive: Secondary | ICD-10-CM | POA: Diagnosis not present

## 2023-05-27 DIAGNOSIS — G893 Neoplasm related pain (acute) (chronic): Secondary | ICD-10-CM | POA: Diagnosis not present

## 2023-05-27 DIAGNOSIS — C787 Secondary malignant neoplasm of liver and intrahepatic bile duct: Secondary | ICD-10-CM | POA: Diagnosis not present

## 2023-05-27 DIAGNOSIS — R638 Other symptoms and signs concerning food and fluid intake: Secondary | ICD-10-CM | POA: Diagnosis not present

## 2023-05-27 DIAGNOSIS — Z515 Encounter for palliative care: Secondary | ICD-10-CM | POA: Diagnosis not present

## 2023-05-27 DIAGNOSIS — K668 Other specified disorders of peritoneum: Secondary | ICD-10-CM | POA: Diagnosis not present

## 2023-05-28 DIAGNOSIS — K251 Acute gastric ulcer with perforation: Secondary | ICD-10-CM | POA: Diagnosis not present

## 2023-05-28 DIAGNOSIS — R633 Feeding difficulties, unspecified: Secondary | ICD-10-CM | POA: Diagnosis not present

## 2023-05-28 DIAGNOSIS — K668 Other specified disorders of peritoneum: Secondary | ICD-10-CM | POA: Diagnosis not present

## 2023-05-28 DIAGNOSIS — R1312 Dysphagia, oropharyngeal phase: Secondary | ICD-10-CM | POA: Diagnosis not present

## 2023-05-31 ENCOUNTER — Inpatient Hospital Stay: Admitting: Dietician

## 2023-05-31 ENCOUNTER — Telehealth: Payer: Self-pay | Admitting: Dietician

## 2023-05-31 NOTE — Transitions of Care (Post Inpatient/ED Visit) (Signed)
 05/31/2023  Patient ID: Helen Loa, male   DOB: 1940-06-06, 83 y.o.   MRN: [id number]  Chart Review for transitions of care.  See [facility] for documentation.  Ashten Sarnowski J. Korbyn Chopin RN, MSN [facility], [facility] - [facility] RN Care Manager Direct Dial: [phone]  Fax: [phone] Website: [email or web address]

## 2023-05-31 NOTE — Telephone Encounter (Signed)
 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 S/p resection, tracheostomy, bilateral neck dissection under the care of Dr. Tellis Feathers (10/14/21). Pt currently receiving docetaxel  q7d (start 4/2).   5/14-5/18 [facility] admission with SBO MBS - reg diet as tolerated   Spoke with patient sister (Nathaniel Hicks) for nutrition follow-up via telephone. She reports patient is glad to be home. He is eating better and drinking Ensure. Plans for percutaneous feeding tube not recommended during admission secondary to SBO. Pt not felt to be a good candidate for surgical placement per sister.    Medications: reviewed   Labs: 5/17 [facility] - Mg 1.8, phos 1.7  Anthropometrics: Last wt 137 lb 4.8 oz on 5/7  4/30 - 139 lb 5.3 oz 4/23 - 143 lb 4.8 oz    NUTRITION DIAGNOSIS: Inadequate oral intake continues   INTERVENTION:  Continue 4 Ensure Complete/equivalent - will leave samples for pick up at 5/21 [facility] appt Encourage high calorie high protein foods to promote wt gain Continue appetite stimulant    MONITORING, EVALUATION, GOAL: wt trends, intake    NEXT VISIT: Thursday May 29 via telephone

## 2023-06-01 ENCOUNTER — Telehealth: Payer: Self-pay

## 2023-06-01 DIAGNOSIS — Z09 Encounter for follow-up examination after completed treatment for conditions other than malignant neoplasm: Secondary | ICD-10-CM | POA: Diagnosis not present

## 2023-06-01 DIAGNOSIS — R531 Weakness: Secondary | ICD-10-CM | POA: Diagnosis not present

## 2023-06-01 DIAGNOSIS — R1010 Upper abdominal pain, unspecified: Secondary | ICD-10-CM | POA: Diagnosis not present

## 2023-06-01 DIAGNOSIS — C069 Malignant neoplasm of mouth, unspecified: Secondary | ICD-10-CM | POA: Diagnosis not present

## 2023-06-01 DIAGNOSIS — K668 Other specified disorders of peritoneum: Secondary | ICD-10-CM | POA: Diagnosis not present

## 2023-06-02 ENCOUNTER — Inpatient Hospital Stay

## 2023-06-02 ENCOUNTER — Telehealth: Payer: Self-pay

## 2023-06-02 ENCOUNTER — Other Ambulatory Visit: Payer: Self-pay | Admitting: *Deleted

## 2023-06-02 ENCOUNTER — Inpatient Hospital Stay (HOSPITAL_BASED_OUTPATIENT_CLINIC_OR_DEPARTMENT_OTHER): Admitting: Hematology

## 2023-06-02 VITALS — BP 101/59 | HR 107 | Temp 96.8°F | Resp 20

## 2023-06-02 DIAGNOSIS — C049 Malignant neoplasm of floor of mouth, unspecified: Secondary | ICD-10-CM

## 2023-06-02 DIAGNOSIS — Z87891 Personal history of nicotine dependence: Secondary | ICD-10-CM | POA: Diagnosis not present

## 2023-06-02 DIAGNOSIS — R634 Abnormal weight loss: Secondary | ICD-10-CM | POA: Diagnosis not present

## 2023-06-02 DIAGNOSIS — C787 Secondary malignant neoplasm of liver and intrahepatic bile duct: Secondary | ICD-10-CM | POA: Diagnosis not present

## 2023-06-02 DIAGNOSIS — K1379 Other lesions of oral mucosa: Secondary | ICD-10-CM | POA: Diagnosis not present

## 2023-06-02 LAB — COMPREHENSIVE METABOLIC PANEL WITH GFR
ALT: 24 U/L (ref 0–44)
AST: 47 U/L — ABNORMAL HIGH (ref 15–41)
Albumin: 2.2 g/dL — ABNORMAL LOW (ref 3.5–5.0)
Alkaline Phosphatase: 71 U/L (ref 38–126)
Anion gap: 7 (ref 5–15)
BUN: 10 mg/dL (ref 8–23)
CO2: 20 mmol/L — ABNORMAL LOW (ref 22–32)
Calcium: 9.2 mg/dL (ref 8.9–10.3)
Chloride: 109 mmol/L (ref 98–111)
Creatinine, Ser: 0.61 mg/dL (ref 0.61–1.24)
GFR, Estimated: >60 mL/min (ref >60)
Glucose, Bld: 106 mg/dL — ABNORMAL HIGH (ref 70–99)
Potassium: 3.2 mmol/L — ABNORMAL LOW (ref 3.5–5.1)
Sodium: 136 mmol/L (ref 135–145)
Total Bilirubin: 0.9 mg/dL (ref 0.0–1.2)
Total Protein: 5.8 g/dL — ABNORMAL LOW (ref 6.5–8.1)

## 2023-06-02 LAB — CBC WITH DIFFERENTIAL/PLATELET
Abs Immature Granulocytes: 0.07 10*3/uL (ref 0.00–0.07)
Basophils Absolute: 0.1 10*3/uL (ref 0.0–0.1)
Basophils Relative: 1 %
Eosinophils Absolute: 0 10*3/uL (ref 0.0–0.5)
Eosinophils Relative: 0 %
HCT: 35.1 % — ABNORMAL LOW (ref 39.0–52.0)
Hemoglobin: 11.5 g/dL — ABNORMAL LOW (ref 13.0–17.0)
Immature Granulocytes: 1 %
Lymphocytes Relative: 5 %
Lymphs Abs: 0.6 10*3/uL — ABNORMAL LOW (ref 0.7–4.0)
MCH: 33 pg (ref 26.0–34.0)
MCHC: 32.8 g/dL (ref 30.0–36.0)
MCV: 100.6 fL — ABNORMAL HIGH (ref 80.0–100.0)
Monocytes Absolute: 0.9 10*3/uL (ref 0.1–1.0)
Monocytes Relative: 8 %
Neutro Abs: 9.4 10*3/uL — ABNORMAL HIGH (ref 1.7–7.7)
Neutrophils Relative %: 85 %
Platelets: 306 10*3/uL (ref 150–400)
RBC: 3.49 MIL/uL — ABNORMAL LOW (ref 4.22–5.81)
RDW: 17.1 % — ABNORMAL HIGH (ref 11.5–15.5)
WBC: 11 10*3/uL — ABNORMAL HIGH (ref 4.0–10.5)
nRBC: 0.2 % (ref 0.0–0.2)

## 2023-06-02 LAB — MAGNESIUM: Magnesium: 1.7 mg/dL (ref 1.7–2.4)

## 2023-06-02 MED ORDER — SUCRALFATE 1 GM/10ML PO SUSP
2.0000 g | Freq: Three times a day (TID) | ORAL | 3 refills | Status: DC
Start: 2023-06-02 — End: 2023-06-10

## 2023-06-02 MED ORDER — MAGNESIUM SULFATE 2 GM/50ML IV SOLN
2.0000 g | Freq: Once | INTRAVENOUS | Status: AC
  Administered 2023-06-02: 2 g via INTRAVENOUS
  Filled 2023-06-02: qty 50

## 2023-06-02 MED ORDER — SUCRALFATE 1 GM/10ML PO SUSP
3.0000 g | Freq: Once | ORAL | Status: AC
  Administered 2023-06-02: 3 g via ORAL
  Filled 2023-06-02: qty 30

## 2023-06-02 MED ORDER — SODIUM CHLORIDE 0.9% FLUSH
10.0000 mL | Freq: Once | INTRAVENOUS | Status: AC
  Administered 2023-06-02: 10 mL via INTRAVENOUS

## 2023-06-02 MED ORDER — OMEPRAZOLE 40 MG PO CPDR: 40.0000 mg | DELAYED_RELEASE_CAPSULE | Freq: Two times a day (BID) | ORAL | 2 refills | Status: DC

## 2023-06-02 MED ORDER — SUCRALFATE 1 GM/10ML PO SUSP: 2.0000 g | Freq: Three times a day (TID) | ORAL | 3 refills | Status: DC

## 2023-06-02 MED ORDER — SODIUM CHLORIDE 0.9% FLUSH
10.0000 mL | Freq: Once | INTRAVENOUS | Status: AC | PRN
  Administered 2023-06-02: 10 mL

## 2023-06-02 MED ORDER — HEPARIN SOD (PORK) LOCK FLUSH 100 UNIT/ML IV SOLN
500.0000 [IU] | Freq: Once | INTRAVENOUS | Status: AC | PRN
Start: 2023-06-02 — End: 2023-06-02
  Administered 2023-06-02: 500 [IU]

## 2023-06-02 MED ORDER — POTASSIUM CHLORIDE IN NACL 20-0.9 MEQ/L-% IV SOLN
Freq: Once | INTRAVENOUS | Status: AC
Start: 2023-06-02 — End: 2023-06-02
  Filled 2023-06-02: qty 1000

## 2023-06-02 NOTE — Transitions of Care (Post Inpatient/ED Visit) (Signed)
   06/02/2023  Name: ACESON LABELL MRN: [id number] DOB: April 08, 1940  Today's TOC FU Call Status: Today's TOC FU Call Status:: Unsuccessful Call (3rd Attempt) Unsuccessful Call (3rd Attempt) Date: 06/02/23  Attempted to reach the patient regarding the most recent Inpatient/ED visit.  Follow Up Plan: No further outreach attempts will be made at this time. We have been unable to contact the patient.  Gareld June, BSN, RN [place]  [facility] - [employer] [facility] RN Care Manager [phone]

## 2023-06-02 NOTE — Patient Instructions (Signed)
 [facility] at [facility] Discharge Instructions   You were seen and examined today by Dr. Cheree Cords.  He reviewed the results of your lab work which are normal/stable.   We will hold your treatment today. We will give IV fluids.   Return as scheduled.    Thank you for choosing [facility] at [facility] to provide your oncology and hematology care.  To afford each patient quality time with our provider, please arrive at least 15 minutes before your scheduled appointment time.   If you have a lab appointment with the Cancer Center please come in thru the Main Entrance and check in at the main information desk.  You need to re-schedule your appointment should you arrive 10 or more minutes late.  We strive to give you quality time with our providers, and arriving late affects you and other patients whose appointments are after yours.  Also, if you no show three or more times for appointments you may be dismissed from the clinic at the providers discretion.     Again, thank you for choosing [facility].  Our hope is that these requests will decrease the amount of time that you wait before being seen by our physicians.       _____________________________________________________________  Should you have questions after your visit to [facility], please contact our office at [phone] and follow the prompts.  Our office hours are 8:00 a.m. and 4:30 p.m. Monday - Friday.  Please note that voicemails left after 4:00 p.m. may not be returned until the following business day.  We are closed weekends and major holidays.  You do have access to a nurse 24-7, just call the main number to the clinic [phone] and do not press any options, hold on the line and a nurse will answer the phone.    For prescription refill requests, have your pharmacy contact our office and allow 72 hours.    Due to Covid, you will need to wear a mask  upon entering the hospital. If you do not have a mask, a mask will be given to you at the Main Entrance upon arrival. For doctor visits, patients may have 1 support person age 13 or older with them. For treatment visits, patients can not have anyone with them due to social distancing guidelines and our immunocompromised population.

## 2023-06-02 NOTE — Patient Instructions (Addendum)
 [facility] - A DEPT OF [facility]  Discharge Instructions: Thank you for choosing [facility] to provide your oncology and hematology care.  If you have a lab appointment with the Cancer Center - please note that after April 8th, 2024, all labs will be drawn in the cancer center.  You do not have to check in or register with the main entrance as you have in the past but will complete your check-in in the cancer center.  Wear comfortable clothing and clothing appropriate for easy access to any Portacath or PICC line.   We strive to give you quality time with your provider. You may need to reschedule your appointment if you arrive late (15 or more minutes).  Arriving late affects you and other patients whose appointments are after yours.  Also, if you miss three or more appointments without notifying the office, you may be dismissed from the clinic at the provider's discretion.      For prescription refill requests, have your pharmacy contact our office and allow 72 hours for refills to be completed.    Today you receive house fluids and 3g Sucralfate     BELOW ARE SYMPTOMS THAT SHOULD BE REPORTED IMMEDIATELY: *FEVER GREATER THAN 100.4 F (38 C) OR HIGHER *CHILLS OR SWEATING *NAUSEA AND VOMITING THAT IS NOT CONTROLLED WITH YOUR NAUSEA MEDICATION *UNUSUAL SHORTNESS OF BREATH *UNUSUAL BRUISING OR BLEEDING *URINARY PROBLEMS (pain or burning when urinating, or frequent urination) *BOWEL PROBLEMS (unusual diarrhea, constipation, pain near the anus) TENDERNESS IN MOUTH AND THROAT WITH OR WITHOUT PRESENCE OF ULCERS (sore throat, sores in mouth, or a toothache) UNUSUAL RASH, SWELLING OR PAIN  UNUSUAL VAGINAL DISCHARGE OR ITCHING   Items with * indicate a potential emergency and should be followed up as soon as possible or go to the Emergency Department if any problems should occur.  Please show the CHEMOTHERAPY ALERT CARD or IMMUNOTHERAPY ALERT CARD  at check-in to the Emergency Department and triage nurse.  Should you have questions after your visit or need to cancel or reschedule your appointment, please contact [facility] [place] - A DEPT OF [facility] [phone]  and follow the prompts.  Office hours are 8:00 a.m. to 4:30 p.m. Monday - Friday. Please note that voicemails left after 4:00 p.m. may not be returned until the following business day.  We are closed weekends and major holidays. You have access to a nurse at all times for urgent questions. Please call the main number to the clinic [phone] and follow the prompts.  For any non-urgent questions, you may also contact your provider using MyChart. We now offer e-Visits for anyone 51 and older to request care online for non-urgent symptoms. For details visit [email or web address].   Also download the MyChart app! Go to the app store, search "MyChart", open the app, select [facility], and log in with your MyChart username and password.

## 2023-06-02 NOTE — Progress Notes (Signed)
 Patient presents today for treatment per provider's order. Vital signs stable and patient c/o weakness, fatigue, and shortness of breath. Treatment was held today and house fluids and 3g sucralfate were given.  Discharged from clinic via wheelchair in stable condition. Alert and oriented x 3. F/U with [facility] as scheduled.

## 2023-06-02 NOTE — Progress Notes (Signed)
 [facility] [address], [place] [address]    Clinic Day:  06/02/2023  Referring physician: Omie Bickers, MD  Patient Care Team: Nathaniel Bickers, MD as PCP - General (Internal Medicine) Nathaniel Quiver, MD as Consulting Physician (Urology) Nathaniel Boros, MD as Medical Oncologist (Medical Oncology) Nathaniel Knuckles, RN as Oncology Nurse Navigator (Medical Oncology)   ASSESSMENT & PLAN:   Assessment: 1.  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  Enlarged contralateral right submandibular node.  Enlargement of the left submandibular gland probably reactive.  No abscess. - Biopsy (07/18/2021) floor of the mouth: Invasive well to moderately differentiated keratinizing squamous cell carcinoma.  Tumor cells negative for p16. - 10/14/2021: Floor of the mouth resection, tracheostomy, bilateral selective neck dissections zones 1-3 by Dr. Tellis Feathers - Pathology: Invasive moderately differentiated keratinizing SCC, 4.1 cm, carcinoma invades for a depth of about 1.9 cm and involves saliva gland tissue.  Anterior, posterior, right, left resection margins are involved.  Deep resection margin is negative.  Metastatic carcinoma 1/3 level 1 lymph nodes.  3 level 2 and 3 level 3 lymph nodes negative for carcinoma.  0/10 lymph nodes involved in the left side neck zone 1, 2, 3 dissection.  ENE not identified.  LVI/perineural invasion not identified. - I have talked to pathologist.  Reexcision margins were considered negative.  He has a high risk feature which is T4 tumor. -  PET scan on 12/12/2021: Soft tissue thickening and calcification along the ventral aspect of the trachea with associated hypermetabolism corresponding to recent tracheostomy site.  7 mm right  paratracheal lymph node new with SUV 2.4.  Hypermetabolism along the dome of the right hepatic lobe with probable 2.4 cm low-attenuation lesion - Liver lesion biopsy (01/28/2022): Metastatic squamous cell carcinoma, keratinizing. - NGS testing: PD-L1 (22 C3): CPS: 100, CD274 (PD-L1) amplified, JAK2 amplified, PIK3CA pathogenic variant exon 10, T p53 pathogenic variant, MS-stable, TMB-low - Per Dr. Tellis Feathers, he also has local recurrence of disease in the floor of the mouth. - Keytruda  from 03/10/2022 through 08/24/2022 with progression - XRT to the floor of the mouth on 06/29/2022 - PET scan 09/10/2022: Liver lesion increased in size measures 3.1 x 2.9 cm, previously 2.6 x 1.7 cm.  Right cervical lymph node meta stasis, mildly progressive. - HER2 by IHC 0 on biopsy from 01/28/2022. - Weekly cetuximab  started on 10/07/2022, discontinued on 04/14/2023 due to progression - PET scan (12/24/2022): Hypermetabolic anterior floor of mouth mass stable.  Stable right level 1B nodal metastasis.  Hypermetabolic 4.6 cm right liver dome metastasis increased in size by 8 mm.  No new sites of hypermetabolic metastatic disease. - MRI liver (01/07/2023): Right hepatic liver lesion measures 4.8 x 3.3 cm.  No new hepatic lesions seen. - Weekly docetaxel  started on 04/14/2023   2.  Social/family history: - He lives in his apartment by himself and is independent of ADLs and IADLs.  He does not drive.  He worked as a Curator and several other jobs.  Quit smoking 1 month ago.  He smoked 1 pack/week for more than 50 years. - 1 brother had agent orange related cancer.  Another brother also had cancer, type unknown to the patient.   3.  Stage Ib pure seminoma: - Status post  right radical orchiectomy on 12/20/2012. - He was on close surveillance rather than adjuvant chemotherapy.    Plan: 1.  Stage IV (PT4PN1 M1) SCC of the floor of the mouth, p16 negative: - Last treatment of docetaxel  was on 05/12/2023. - He was hospitalized from  05/26/2023 through 05/29/2023, initially presented to [facility], transferred to [facility].  CTAP at that time showed trace free air within the anterior abdomen.  This was thought to be pneumoperitoneum secondary to gastric ulcer with contained perforation.  He underwent modified barium swallow and was cleared for pured food.  He was apparently referred to a nursing home but patient declined.  Since his discharge, he is eating less.  He is eating pured meals containing scrambled eggs, cheese, pork, cheeseburgers only once per day.  He reports epigastric burning sensation all the time.  He was discharged on Augmentin  for 10 days.  Will give him Carafate and proton pump inhibitor.  He has good bowel sounds.  He was told to go to the ER should his pain not improved. - We have also discussed hospice option again.  He is reluctant to consider it. - He will receive IV fluids with electrolytes today.  RTC 2 weeks for follow-up.   2.  Oral pain: - He will continue liquid hydrocodone  twice daily for the head and neck pain.   3.  Weight loss: - He was supposed to see Dr. Larrie Po tomorrow for PEG tube placement.  Now that he has gastric ulcer, he may not be a candidate for it.   4.  Hypomagnesemia: - Magnesium  is discontinued.  Magnesium  is normal today.   5.  Hypercalcemia: - Calcium is 9.2 with albumin 2.2.  He will receive IV fluids.    No orders of the defined types were placed in this encounter.     Nathaniel Hicks,acting as a Neurosurgeon for Nathaniel Boros, MD.,have documented all relevant documentation on the behalf of Nathaniel Boros, MD,as directed by  Nathaniel Boros, MD while in the presence of Nathaniel Boros, MD.  I, Nathaniel Boros MD, have reviewed the above documentation for accuracy and completeness, and I agree with the above.     Nathaniel Boros, MD   5/21/20251:36 PM  CHIEF COMPLAINT:   Diagnosis: squamous cell carcinoma of the floor of the  mouth, history of testicular seminoma    Cancer Staging  Cancer of floor of mouth [facility]) Staging form: Oral Cavity, AJCC 8th Edition - Clinical stage from 11/25/2021: Stage IVC (cT4a, cN1, pM1) - Signed by Nathaniel Boros, MD on 02/04/2022  Testicle cancer, right Staging form: Testis, AJCC 7th Edition - Clinical: Stage I (T2, N0, M0) - Signed by Doretta Gant, PA-C on 07/23/2013    Prior Therapy: 1. Pembrolizumab , 03/10/22 - 08/24/22 2. XRT to floor of mouth, 06/17/22 - 08/19/22 3. SBRT to liver lesion, 03/15/23 - 03/26/23 4. Cetuximab  weekly, 10/07/22 - 04/14/23  Current Therapy:  docetaxel  weekly    HISTORY OF PRESENT ILLNESS:   Oncology History  Testicle cancer, right  12/20/2012 Surgery   Right total orchiectomyt- 1. Testis, biopsy, right - SEMINOMA. PLEASE SEE COMMENT. 2. Testis, tumor, right - SEMINOMA, 7.5 CM. - ANGIOLYMPHATIC INVASION PRESENT. - RESECTION MARGINS, NEGATIVE FOR ATYPIA OR MALIGNANCY.   01/18/2013 PET scan   Status post right orchiectomy. No findings specific for metastatic disease. Small para-aortic nodes measuring up to 5 mm short axis, without convincing hypermetabolism. Given location, attention on follow-up is suggested.   04/17/2013 Imaging   CT  CAP- No evidence of metastatic disease in the chest, abdomen or pelvis. Proximal LAD coronary artery calcification.   07/20/2013 Imaging   CT abd/pelvis- No evidence of metastatic disease in the abdomen or pelvis.   10/24/2013 Imaging   CT abd/pelvis- No findings to suggest metastatic disease in the abdomen or pelvis   10/24/2013 Imaging   Chest xray- Probable COPD.  Negative for metastatic disease.   02/21/2014 Imaging   CT abd/pelvis- Stable abdominal pelvic CT status post right orchectomy. No evidence of adenopathy or other metastatic disease.   02/21/2014 Imaging   Chest xray- Left lower lobe mild atelectasis and/or infiltrate.     09/10/2014 Imaging   CT abd/pelvis- Status post right orchiectomy.   No evidence  of metastatic disease.   3 mm nonobstructing right upper pole renal calculus. No hydronephrosis.   03/14/2015 Imaging   CT abd/pelvis- Stable exam. No evidence of metastatic disease or other acute findings within the abdomen or pelvis.   09/13/2015 Imaging   CT abd/pelvis- No acute findings and no evidence for mass or adenopathy.    04/24/2016 Imaging   CT abd/pelvis: IMPRESSION: 1. Stable exam. No new or progressive findings. No features to suggest metastatic disease.   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HCC)  10/14/2021 Initial Diagnosis   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HCC)   11/25/2021 Cancer Staging   Staging form: Oral Cavity, AJCC 8th Edition - Clinical stage from 11/25/2021: Stage IVC (cT4a, cN1, pM1) - Signed by Nathaniel Boros, MD on 02/04/2022 Histopathologic type: Squamous cell carcinoma, NOS Stage prefix: Initial diagnosis Histologic grade (G): G2 Histologic grading system: 3 grade system   03/10/2022 - 08/24/2022 Chemotherapy   Patient is on Treatment Plan : HEAD/NECK Pembrolizumab  (200) q21d     10/07/2022 - 03/31/2023 Chemotherapy   Patient is on Treatment Plan : HEAD/NECK Cetuximab  q7d     04/14/2023 -  Chemotherapy   Patient is on Treatment Plan : Head and Neck Docetaxel  (35) q7d        INTERVAL HISTORY:   Nathaniel Hicks is a 83 y.o. male presenting to clinic today for follow up of squamous cell carcinoma of the floor of the mouth, history of testicular seminoma. He was last seen by me on 05/19/23.  Since his last visit, he was admitted to the hospital from 05/26/23 to 05/30/23 for SBO and pneumoperitoneum.   Today, he states that he is doing well overall. His appetite level is at 10%. His energy level is at 10%.  PAST MEDICAL HISTORY:   Past Medical History: Past Medical History:  Diagnosis Date   Arthritis    BPH (benign prostatic hyperplasia)    Difficult intubation    HOH (hard of hearing)    Hyperlipidemia 11/02/2017   Hypertension    Hypertension 11/02/2017   Testicular  cancer (HCC)    2014   Vitamin D deficiency 11/02/2017    Surgical History: Past Surgical History:  Procedure Laterality Date   COLONOSCOPY N/A 11/24/2012   Procedure: COLONOSCOPY;  Surgeon: Ruby Corporal, MD;  Location: [facility];  Service: Endoscopy;  Laterality: N/A;  830-moved to 730 Ann notified pt   FLOOR OF MOUTH BIOPSY N/A 10/14/2021   Procedure: FLOOR OF MOUTH RESECTION;  Surgeon: Virgina Grills, MD;  Location: [facility] OR;  Service: ENT;  Laterality: N/A;   HEMORROIDECTOMY     KNEE ARTHROSCOPY WITH LATERAL MENISECTOMY Right 08/31/2017   Procedure: KNEE ARTHROSCOPY WITH LATERAL MENISECTOMY;  Surgeon: Darrin Emerald, MD;  Location: [facility] ORS;  Service: Orthopedics;  Laterality: Right;   LESION EXCISION N/A 03/23/2012   Procedure: EXCISION NEOPLASM SCALP ;  Surgeon: Beau Bound, MD;  Location: [facility];  Service: General;  Laterality: N/A;  Excision of Scalp Neoplasm   ORCHIECTOMY Right 12/20/2012   Procedure: RIGHT RADICAL ORCHIECTOMY/POSSIBLE BX RIGHT TESTICLE;  Surgeon: Reggie Caper, MD;  Location: [facility];  Service: Urology;  Laterality: Right;   PORTACATH PLACEMENT Left 03/25/2022   Procedure: INSERTION PORT-A-CATH;  Surgeon: Alanda Allegra, MD;  Location: [facility];  Service: General;  Laterality: Left;   PROSTATE SURGERY     RADICAL NECK DISSECTION Bilateral 10/14/2021   Procedure: NECK DISSECTION;  Surgeon: Virgina Grills, MD;  Location: [facility] OR;  Service: ENT;  Laterality: Bilateral;   SCALP LACERATION REPAIR     [facility]-Dr Felipe Horton   SKIN FULL THICKNESS GRAFT Bilateral 10/14/2021   Procedure: PLATYSMA FLAP CLOSURE;  Surgeon: Virgina Grills, MD;  Location: [facility] OR;  Service: ENT;  Laterality: Bilateral;   TOOTH EXTRACTION  10/14/2021   Procedure: DENTAL EXTRACTIONS;  Surgeon: Virgina Grills, MD;  Location: [facility] OR;  Service: ENT;;   TRACHEOSTOMY TUBE PLACEMENT N/A 10/14/2021   Procedure: TRACHEOSTOMY;  Surgeon: Virgina Grills, MD;  Location: [facility] OR;  Service: ENT;  Laterality: N/A;     Social History: Social History   Socioeconomic History   Marital status: Single    Spouse name: Not on file   Number of children: Not on file   Years of education: Not on file   Highest education level: Not on file  Occupational History   Not on file  Tobacco Use   Smoking status: Former    Current packs/day: 0.25    Average packs/day: 0.3 packs/day for 50.0 years (12.5 ttl pk-yrs)    Types: Cigarettes   Smokeless tobacco: Never  Vaping Use   Vaping status: Never Used  Substance and Sexual Activity   Alcohol use: Not Currently   Drug use: No   Sexual activity: Yes    Birth control/protection: None  Other Topics Concern   Not on file  Social History Narrative   Not on file   Social Drivers of Health   Financial Resource Strain: Not on file  Food Insecurity: Not on file  Transportation Needs: Not on file  Physical Activity: Not on file  Stress: Not on file  Social Connections: Not on file  Intimate Partner Violence: Not on file    Family History: Family History  Problem Relation Age of Onset   Cancer Brother    Cancer Brother     Current Medications:  Current Outpatient Medications:    acetaminophen  (TYLENOL ) 650 MG CR tablet, Take 650 mg by mouth every 8 (eight) hours as needed for pain., Disp: , Rfl:    amLODipine -olmesartan  (AZOR ) 5-20 MG tablet, TAKE 1 TABLET BY MOUTH DAILY, Disp: 90 tablet, Rfl: 0   amoxicillin -clavulanate (AUGMENTIN ) 875-125 MG tablet, Take 1 tablet by mouth., Disp: , Rfl:    budesonide -formoterol  (SYMBICORT ) 80-4.5 MCG/ACT inhaler, Inhale 2 puffs into the lungs in the morning and at bedtime., Disp: 1 each, Rfl: 12   chlorhexidine  (PERIDEX ) 0.12 % solution, Use as directed 5 mLs in the mouth or throat 2 (two) times daily., Disp: , Rfl:    Cholecalciferol (VITAMIN D3) 50 MCG (2000 UT) TABS, Take 2,000 Units by mouth daily., Disp: , Rfl:    ciprofloxacin-dexamethasone  (CIPRODEX) OTIC suspension, Place 4 drops into the left ear 2 (two)  times daily., Disp: , Rfl:  clindamycin  (CLINDAGEL) 1 % gel, Apply topically 2 (two) times daily. Apply twice daily to chest rash, Disp: 30 g, Rfl: 0   doxycycline  (VIBRA -TABS) 100 MG tablet, TAKE 1 TABLET(100 MG) BY MOUTH TWICE DAILY, Disp: 60 tablet, Rfl: 3   HYDROcodone -acetaminophen  (HYCET) 7.5-325 mg/15 ml solution, Take 15 mLs by mouth every 6 (six) hours as needed for moderate pain (pain score 4-6)., Disp: 473 mL, Rfl: 0   lidocaine  (XYLOCAINE ) 2 % solution, Take by mouth., Disp: , Rfl:    magnesium  oxide (MAG-OX) 400 MG tablet, Take 2 tablets (800 mg total) by mouth 2 (two) times daily., Disp: 120 tablet, Rfl: 3   megestrol  (MEGACE ) 400 MG/10ML suspension, Take 10 mLs (400 mg total) by mouth 2 (two) times daily., Disp: 480 mL, Rfl: 3   Menthol, Topical Analgesic, (BIOFREEZE EX), Apply 1 application  topically daily as needed (pain)., Disp: , Rfl:    naloxone  (NARCAN ) nasal spray 4 mg/0.1 mL, SMARTSIG:Both Nares, Disp: , Rfl:    omeprazole (PRILOSEC) 40 MG capsule, Take 1 capsule (40 mg total) by mouth in the morning and at bedtime., Disp: 60 capsule, Rfl: 2   ondansetron  (ZOFRAN -ODT) 4 MG disintegrating tablet, Take 1 tablet (4 mg total) by mouth every 8 (eight) hours as needed for nausea or vomiting., Disp: 20 tablet, Rfl: 0   Polyethyl Glycol-Propyl Glycol (GOODSENSE LUBRICANT EYE DROPS OP), Place 1 drop into both eyes daily as needed (dry eyes)., Disp: , Rfl:    pravastatin  (PRAVACHOL ) 80 MG tablet, Take 80 mg by mouth daily., Disp: , Rfl:    RAPAFLO 8 MG CAPS capsule, Take 8 mg by mouth daily. Silodosin, Disp: , Rfl:    sucralfate (CARAFATE) 1 GM/10ML suspension, Take 20 mLs (2 g total) by mouth 4 (four) times daily -  with meals and at bedtime., Disp: 473 mL, Rfl: 3   vitamin B-12 (CYANOCOBALAMIN ) 500 MCG tablet, Take 500 mcg by mouth daily., Disp: , Rfl:  No current facility-administered medications for this visit.  Facility-Administered Medications Ordered in Other Visits:     heparin  lock flush 100 unit/mL, 500 Units, Intracatheter, Once PRN, Noboru Bidinger, MD   magnesium  sulfate IVPB 2 g 50 mL, 2 g, Intravenous, Once, Nathaniel Boros, MD, Last Rate: 50 mL/hr at 06/02/23 1251, 2 g at 06/02/23 1251   sodium chloride  flush (NS) 0.9 % injection 10 mL, 10 mL, Intracatheter, Once PRN, Breanna Mcdaniel, MD   Allergies: No Known Allergies  REVIEW OF SYSTEMS:   Review of Systems  Constitutional:  Negative for chills, fatigue and fever.  HENT:   Negative for lump/mass, mouth sores, nosebleeds, sore throat and trouble swallowing.   Eyes:  Negative for eye problems.  Respiratory:  Negative for cough and shortness of breath.   Cardiovascular:  Negative for chest pain, leg swelling and palpitations.  Gastrointestinal:  Positive for abdominal pain, diarrhea, nausea and vomiting. Negative for constipation.  Genitourinary:  Negative for bladder incontinence, difficulty urinating, dysuria, frequency, hematuria and nocturia.   Musculoskeletal:  Negative for arthralgias, back pain, flank pain, myalgias and neck pain.  Skin:  Negative for itching and rash.  Neurological:  Negative for dizziness, headaches and numbness.  Hematological:  Does not bruise/bleed easily.  Psychiatric/Behavioral:  Negative for depression, sleep disturbance and suicidal ideas. The patient is not nervous/anxious.   All other systems reviewed and are negative.    VITALS:   Blood pressure (!) 101/59, pulse (!) 107, temperature (!) 96.8 F (36 C), temperature source Oral, resp. rate 20,  SpO2 94%.  Wt Readings from Last 3 Encounters:  05/19/23 137 lb 4.8 oz (62.3 kg)  05/13/23 139 lb 5.3 oz (63.2 kg)  05/12/23 139 lb 5.3 oz (63.2 kg)    There is no height or weight on file to calculate BMI.  Performance status (ECOG): 1 - Symptomatic but completely ambulatory  PHYSICAL EXAM:   Physical Exam Vitals and nursing note reviewed. Exam conducted with a chaperone present.   Constitutional:      Appearance: Normal appearance.  Cardiovascular:     Rate and Rhythm: Normal rate and regular rhythm.     Pulses: Normal pulses.     Heart sounds: Normal heart sounds.  Pulmonary:     Effort: Pulmonary effort is normal.     Breath sounds: Normal breath sounds.  Abdominal:     Palpations: Abdomen is soft. There is no hepatomegaly, splenomegaly or mass.     Tenderness: There is no abdominal tenderness.  Musculoskeletal:     Right lower leg: No edema.     Left lower leg: No edema.  Lymphadenopathy:     Cervical: No cervical adenopathy.     Right cervical: No superficial, deep or posterior cervical adenopathy.    Left cervical: No superficial, deep or posterior cervical adenopathy.     Upper Body:     Right upper body: No supraclavicular or axillary adenopathy.     Left upper body: No supraclavicular or axillary adenopathy.  Neurological:     General: No focal deficit present.     Mental Status: He is alert and oriented to person, place, and time.  Psychiatric:        Mood and Affect: Mood normal.        Behavior: Behavior normal.     LABS:   CBC     Component Value Date/Time   WBC 11.0 (H) 06/02/2023 1118   RBC 3.49 (L) 06/02/2023 1118   HGB 11.5 (L) 06/02/2023 1118   HCT 35.1 (L) 06/02/2023 1118   PLT 306 06/02/2023 1118   MCV 100.6 (H) 06/02/2023 1118   MCH 33.0 06/02/2023 1118   MCHC 32.8 06/02/2023 1118   RDW 17.1 (H) 06/02/2023 1118   LYMPHSABS 0.6 (L) 06/02/2023 1118   MONOABS 0.9 06/02/2023 1118   EOSABS 0.0 06/02/2023 1118   BASOSABS 0.1 06/02/2023 1118    CMP      Component Value Date/Time   NA 136 06/02/2023 1118   K 3.2 (L) 06/02/2023 1118   CL 109 06/02/2023 1118   CO2 20 (L) 06/02/2023 1118   GLUCOSE 106 (H) 06/02/2023 1118   BUN 10 06/02/2023 1118   CREATININE 0.61 06/02/2023 1118   CALCIUM 9.2 06/02/2023 1118   PROT 5.8 (L) 06/02/2023 1118   ALBUMIN 2.2 (L) 06/02/2023 1118   AST 47 (H) 06/02/2023 1118   ALT 24  06/02/2023 1118   ALKPHOS 71 06/02/2023 1118   BILITOT 0.9 06/02/2023 1118   GFRNONAA >60 06/02/2023 1118   GFRAA >60 10/26/2018 1137     Lab Results  Component Value Date   CEA 2.2 01/19/2013   /  CEA  Date Value Ref Range Status  01/19/2013 2.2 0.0 - 5.0 ng/mL Final    Comment:    Performed at [facility]   No results found for: "PSA1" No results found for: "CAN199" No results found for: "CAN125"  No results found for: "TOTALPROTELP", "ALBUMINELP", "A1GS", "A2GS", "BETS", "BETA2SER", "GAMS", "MSPIKE", "SPEI" Lab Results  Component Value Date   TIBC  244 (L) 08/04/2022   TIBC 251 05/12/2022   FERRITIN 273 08/04/2022   FERRITIN 195 05/12/2022   IRONPCTSAT 19 08/04/2022   IRONPCTSAT 18 05/12/2022   Lab Results  Component Value Date   LDH 172 11/12/2020   LDH 175 10/26/2018   LDH 179 10/25/2013     STUDIES:   CT ABDOMEN PELVIS W CONTRAST Result Date: 05/13/2023 CLINICAL DATA:  Acute nonlocalized abdominal pain. Vomiting beginning last night. Vomiting after discharge from cancer treatment. EXAM: CT ABDOMEN AND PELVIS WITH CONTRAST TECHNIQUE: Multidetector CT imaging of the abdomen and pelvis was performed using the standard protocol following bolus administration of intravenous contrast. RADIATION DOSE REDUCTION: This exam was performed according to the departmental dose-optimization program which includes automated exposure control, adjustment of the mA and/or kV according to patient size and/or use of iterative reconstruction technique. CONTRAST:  OMNIPAQUE  IOHEXOL  300 MG/ML  SOLN COMPARISON:  PET-CT 04/08/2023.  MRI abdomen 01/07/2023 FINDINGS: Lower chest: Atelectasis or infiltration in the lung bases. Mild cardiac enlargement. Hepatobiliary: Focal heterogeneously hypoenhancing lesion in the right lobe of the liver measuring 5.4 x 6 cm. This corresponds to known neoplasm. Gallbladder and bile ducts are normal. Pancreas: Unremarkable. No pancreatic ductal  dilatation or surrounding inflammatory changes. Spleen: Normal in size without focal abnormality. Adrenals/Urinary Tract: No adrenal gland nodules. Symmetrical nephrograms. Stone in the lower pole right kidney measuring 5 mm diameter. No hydronephrosis or hydroureter. Bilateral renal cysts are unchanged since prior study. No imaging follow-up is indicated. Bladder is normal. Stomach/Bowel: Stomach, small bowel, and colon are not abnormally distended. No wall thickening or inflammatory changes. Stool throughout the colon. Appendix is not identified. Vascular/Lymphatic: Aortic atherosclerosis. No enlarged abdominal or pelvic lymph nodes. Reproductive: Prostate gland is enlarged, measuring 5.6 cm diameter. Other: No free air or free fluid in the abdomen. Abdominal wall musculature appears intact. Musculoskeletal: Degenerative changes in the spine. Old rib fractures and old fracture deformities of lumbar transverse processes. Degenerative changes in the hips. No acute destructive bone lesion. IMPRESSION: 1. Atelectasis in the lung bases. 2. Unchanged appearance of known focal liver lesion. 3. No evidence of bowel obstruction or inflammation. 4. Aortic atherosclerosis. 5. Enlarged prostate gland. 6. Nonobstructing stone in the right kidney. Electronically Signed   By: Boyce Byes M.D.   On: 05/13/2023 19:08

## 2023-06-03 ENCOUNTER — Ambulatory Visit (INDEPENDENT_AMBULATORY_CARE_PROVIDER_SITE_OTHER): Admitting: General Surgery

## 2023-06-03 ENCOUNTER — Encounter: Payer: Self-pay | Admitting: General Surgery

## 2023-06-03 VITALS — BP 100/61 | HR 98 | Temp 97.6°F | Resp 18 | Ht 71.0 in | Wt 137.0 lb

## 2023-06-03 DIAGNOSIS — C049 Malignant neoplasm of floor of mouth, unspecified: Secondary | ICD-10-CM | POA: Diagnosis not present

## 2023-06-04 NOTE — Progress Notes (Signed)
 Nathaniel Hicks; [id number]; [birth year]   HPI Patient is an 83 year old black male who was referred to my care by Dr. Cheree Cords for placement of a PEG tube.  Patient has a malignancy of the floor of his mouth.  He is currently on a pured diet.  He does have difficulty swallowing.  He recently was just discharged from [facility] for treatment of a contained peptic ulcer which caused pneumoperitoneum.  This was treated nonoperatively.  Patient currently denies any abdominal pain.  History is limited as the patient has j dysarthria and is hard to understand.   Past Medical History:  Diagnosis Date   Arthritis    BPH (benign prostatic hyperplasia)    Difficult intubation    HOH (hard of hearing)    Hyperlipidemia 11/02/2017   Hypertension    Hypertension 11/02/2017   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HCC)    2014   Vitamin D deficiency 11/02/2017    Past Surgical History:  Procedure Laterality Date   COLONOSCOPY N/A 11/24/2012   Procedure: COLONOSCOPY;  Surgeon: Ruby Corporal, MD;  [facility];  Service: Endoscopy;  Laterality: N/A;  830-moved to 730 Ann notified pt   FLOOR OF MOUTH BIOPSY N/A 10/14/2021   Procedure: FLOOR OF MOUTH RESECTION;  Surgeon: Virgina Grills, MD;  Location: [facility] OR;  Service: ENT;  Laterality: N/A;   HEMORROIDECTOMY     KNEE ARTHROSCOPY WITH LATERAL MENISECTOMY Right 08/31/2017   Procedure: KNEE ARTHROSCOPY WITH LATERAL MENISECTOMY;  Surgeon: Darrin Emerald, MD;  Location: [facility] ORS;  Service: Orthopedics;  Laterality: Right;   LESION EXCISION N/A 03/23/2012   Procedure: EXCISION NEOPLASM SCALP ;  Surgeon: Beau Bound, MD;  Location: [facility];  Service: General;  Laterality: N/A;  Excision of Scalp Neoplasm   ORCHIECTOMY Right 12/20/2012   Procedure: RIGHT RADICAL ORCHIECTOMY/POSSIBLE BX RIGHT TESTICLE;  Surgeon: Reggie Caper, MD;  Location: [facility];  Service: Urology;  Laterality: Right;   PORTACATH PLACEMENT Left 03/25/2022    Procedure: INSERTION PORT-A-CATH;  Surgeon: Alanda Allegra, MD;  Location: [facility];  Service: General;  Laterality: Left;   PROSTATE SURGERY     RADICAL NECK DISSECTION Bilateral 10/14/2021   Procedure: NECK DISSECTION;  Surgeon: Virgina Grills, MD;  Location: [facility] OR;  Service: ENT;  Laterality: Bilateral;   SCALP LACERATION REPAIR     [facility]-Dr Felipe Horton   SKIN FULL THICKNESS GRAFT Bilateral 10/14/2021   Procedure: PLATYSMA FLAP CLOSURE;  Surgeon: Virgina Grills, MD;  Location: [facility] OR;  Service: ENT;  Laterality: Bilateral;   TOOTH EXTRACTION  10/14/2021   Procedure: DENTAL EXTRACTIONS;  Surgeon: Virgina Grills, MD;  Location: [facility] OR;  Service: ENT;;   TRACHEOSTOMY TUBE PLACEMENT N/A 10/14/2021   Procedure: TRACHEOSTOMY;  Surgeon: Virgina Grills, MD;  Location: [facility] OR;  Service: ENT;  Laterality: N/A;    Family History  Problem Relation Age of Onset   Cancer Brother    Cancer Brother     Current Outpatient Medications on File Prior to Visit  Medication Sig Dispense Refill   acetaminophen  (TYLENOL ) 650 MG CR tablet Take 650 mg by mouth every 8 (eight) hours as needed for pain.     amLODipine -olmesartan  (AZOR ) 5-20 MG tablet TAKE 1 TABLET BY MOUTH DAILY 90 tablet 0   amoxicillin -clavulanate (AUGMENTIN ) 875-125 MG tablet Take 1 tablet by mouth.     budesonide -formoterol  (SYMBICORT ) 80-4.5 MCG/ACT inhaler Inhale 2 puffs into the lungs in the morning and at bedtime. 1 each 12   chlorhexidine  (  PERIDEX ) 0.12 % solution Use as directed 5 mLs in the mouth or throat 2 (two) times daily.     Cholecalciferol (VITAMIN D3) 50 MCG (2000 UT) TABS Take 2,000 Units by mouth daily.     ciprofloxacin-dexamethasone  (CIPRODEX) OTIC suspension Place 4 drops into the left ear 2 (two) times daily.     clindamycin  (CLINDAGEL) 1 % gel Apply topically 2 (two) times daily. Apply twice daily to chest rash 30 g 0   doxycycline  (VIBRA -TABS) 100 MG tablet TAKE 1 TABLET(100 MG) BY MOUTH TWICE DAILY 60 tablet 3    HYDROcodone -acetaminophen  (HYCET) 7.5-325 mg/15 ml solution Take 15 mLs by mouth every 6 (six) hours as needed for moderate pain (pain score 4-6). 473 mL 0   lidocaine  (XYLOCAINE ) 2 % solution Take by mouth.     magnesium  oxide (MAG-OX) 400 MG tablet Take 2 tablets (800 mg total) by mouth 2 (two) times daily. 120 tablet 3   megestrol  (MEGACE ) 400 MG/10ML suspension Take 10 mLs (400 mg total) by mouth 2 (two) times daily. 480 mL 3   Menthol, Topical Analgesic, (BIOFREEZE EX) Apply 1 application  topically daily as needed (pain).     naloxone  (NARCAN ) nasal spray 4 mg/0.1 mL SMARTSIG:Both Nares     omeprazole (PRILOSEC) 40 MG capsule Take 1 capsule (40 mg total) by mouth in the morning and at bedtime. 60 capsule 2   ondansetron  (ZOFRAN -ODT) 4 MG disintegrating tablet Take 1 tablet (4 mg total) by mouth every 8 (eight) hours as needed for nausea or vomiting. 20 tablet 0   Polyethyl Glycol-Propyl Glycol (GOODSENSE LUBRICANT EYE DROPS OP) Place 1 drop into both eyes daily as needed (dry eyes).     pravastatin  (PRAVACHOL ) 80 MG tablet Take 80 mg by mouth daily.     RAPAFLO 8 MG CAPS capsule Take 8 mg by mouth daily. Silodosin     sucralfate (CARAFATE) 1 GM/10ML suspension Take 20 mLs (2 g total) by mouth 4 (four) times daily -  with meals and at bedtime. 473 mL 3   vitamin B-12 (CYANOCOBALAMIN ) 500 MCG tablet Take 500 mcg by mouth daily.     No current facility-administered medications on file prior to visit.    No Known Allergies  Social History   Substance and Sexual Activity  Alcohol Use Not Currently    Social History   Tobacco Use  Smoking Status Former   Current packs/day: 0.25   Average packs/day: 0.3 packs/day for 50.0 years (12.5 ttl pk-yrs)   Types: Cigarettes  Smokeless Tobacco Never    Review of Systems  Unable to perform ROS: Mental acuity  All other systems reviewed and are negative.   Objective   Vitals:   06/03/23 1012  BP: 100/61  Pulse: 98  Resp: 18  Temp:  97.6 F (36.4 C)  SpO2: (!) 89%    Physical Exam Vitals reviewed.  Constitutional:      Appearance: He is not ill-appearing.     Comments: Patient in a wheelchair.  HENT:     Head: Normocephalic and atraumatic.  Cardiovascular:     Rate and Rhythm: Normal rate and regular rhythm.     Heart sounds: Normal heart sounds. No murmur heard.    No friction rub. No gallop.  Pulmonary:     Effort: Pulmonary effort is normal. No respiratory distress.     Breath sounds: Normal breath sounds. No stridor. No wheezing, rhonchi or rales.  Abdominal:     General: Bowel sounds are normal. There  is no distension.     Palpations: Abdomen is soft. There is no mass.     Tenderness: There is no abdominal tenderness. There is no guarding or rebound.     Hernia: No hernia is present.     Comments: No peritoneal signs noted.  Neurological:     Mental Status: He is oriented to person, place, and time.     Assessment  Floor of the mouth carcinoma, recent discharge for nonoperative treatment of contained peptic ulcer/pneumoperitoneum Plan  I told the patient and family member that a PEG was contraindicated at this point given his recent diagnosis of a peptic ulcer.  Patient will be following up with Dr. Katragadda for further management at this point.  He should continue his pured diet.  Given his difficulty with intubation, a jejunostomy tube is not possible at the present time.

## 2023-06-07 ENCOUNTER — Other Ambulatory Visit: Payer: Self-pay

## 2023-06-07 ENCOUNTER — Encounter (HOSPITAL_COMMUNITY): Payer: Self-pay | Admitting: Emergency Medicine

## 2023-06-07 ENCOUNTER — Inpatient Hospital Stay (HOSPITAL_COMMUNITY)
Admission: EM | Admit: 2023-06-07 | Discharge: 2023-06-13 | DRG: 871 | Disposition: E | Attending: Emergency Medicine | Admitting: Emergency Medicine

## 2023-06-07 ENCOUNTER — Emergency Department (HOSPITAL_COMMUNITY)

## 2023-06-07 DIAGNOSIS — R64 Cachexia: Secondary | ICD-10-CM | POA: Diagnosis present

## 2023-06-07 DIAGNOSIS — Z7189 Other specified counseling: Secondary | ICD-10-CM | POA: Diagnosis not present

## 2023-06-07 DIAGNOSIS — R0989 Other specified symptoms and signs involving the circulatory and respiratory systems: Secondary | ICD-10-CM | POA: Diagnosis not present

## 2023-06-07 DIAGNOSIS — I5043 Acute on chronic combined systolic (congestive) and diastolic (congestive) heart failure: Secondary | ICD-10-CM | POA: Diagnosis not present

## 2023-06-07 DIAGNOSIS — L89312 Pressure ulcer of right buttock, stage 2: Secondary | ICD-10-CM | POA: Diagnosis present

## 2023-06-07 DIAGNOSIS — C049 Malignant neoplasm of floor of mouth, unspecified: Secondary | ICD-10-CM | POA: Diagnosis not present

## 2023-06-07 DIAGNOSIS — R7989 Other specified abnormal findings of blood chemistry: Secondary | ICD-10-CM

## 2023-06-07 DIAGNOSIS — Z515 Encounter for palliative care: Secondary | ICD-10-CM | POA: Diagnosis not present

## 2023-06-07 DIAGNOSIS — Z1152 Encounter for screening for COVID-19: Secondary | ICD-10-CM | POA: Diagnosis not present

## 2023-06-07 DIAGNOSIS — Z7951 Long term (current) use of inhaled steroids: Secondary | ICD-10-CM | POA: Diagnosis not present

## 2023-06-07 DIAGNOSIS — Z681 Body mass index (BMI) 19 or less, adult: Secondary | ICD-10-CM

## 2023-06-07 DIAGNOSIS — R570 Cardiogenic shock: Secondary | ICD-10-CM | POA: Diagnosis not present

## 2023-06-07 DIAGNOSIS — R0602 Shortness of breath: Secondary | ICD-10-CM | POA: Diagnosis present

## 2023-06-07 DIAGNOSIS — I493 Ventricular premature depolarization: Secondary | ICD-10-CM | POA: Diagnosis present

## 2023-06-07 DIAGNOSIS — R5381 Other malaise: Secondary | ICD-10-CM | POA: Diagnosis not present

## 2023-06-07 DIAGNOSIS — J9601 Acute respiratory failure with hypoxia: Principal | ICD-10-CM | POA: Diagnosis present

## 2023-06-07 DIAGNOSIS — J9600 Acute respiratory failure, unspecified whether with hypoxia or hypercapnia: Secondary | ICD-10-CM | POA: Diagnosis present

## 2023-06-07 DIAGNOSIS — Z8711 Personal history of peptic ulcer disease: Secondary | ICD-10-CM

## 2023-06-07 DIAGNOSIS — D638 Anemia in other chronic diseases classified elsewhere: Secondary | ICD-10-CM | POA: Diagnosis not present

## 2023-06-07 DIAGNOSIS — L89322 Pressure ulcer of left buttock, stage 2: Secondary | ICD-10-CM | POA: Diagnosis not present

## 2023-06-07 DIAGNOSIS — G9341 Metabolic encephalopathy: Secondary | ICD-10-CM | POA: Diagnosis not present

## 2023-06-07 DIAGNOSIS — Z923 Personal history of irradiation: Secondary | ICD-10-CM

## 2023-06-07 DIAGNOSIS — Z743 Need for continuous supervision: Secondary | ICD-10-CM | POA: Diagnosis not present

## 2023-06-07 DIAGNOSIS — Z66 Do not resuscitate: Secondary | ICD-10-CM | POA: Diagnosis not present

## 2023-06-07 DIAGNOSIS — R0902 Hypoxemia: Secondary | ICD-10-CM | POA: Diagnosis not present

## 2023-06-07 DIAGNOSIS — H9190 Unspecified hearing loss, unspecified ear: Secondary | ICD-10-CM | POA: Diagnosis present

## 2023-06-07 DIAGNOSIS — I110 Hypertensive heart disease with heart failure: Secondary | ICD-10-CM | POA: Diagnosis not present

## 2023-06-07 DIAGNOSIS — J690 Pneumonitis due to inhalation of food and vomit: Secondary | ICD-10-CM | POA: Diagnosis present

## 2023-06-07 DIAGNOSIS — I499 Cardiac arrhythmia, unspecified: Secondary | ICD-10-CM | POA: Diagnosis not present

## 2023-06-07 DIAGNOSIS — A419 Sepsis, unspecified organism: Secondary | ICD-10-CM | POA: Diagnosis not present

## 2023-06-07 DIAGNOSIS — I5021 Acute systolic (congestive) heart failure: Secondary | ICD-10-CM | POA: Diagnosis not present

## 2023-06-07 DIAGNOSIS — I509 Heart failure, unspecified: Secondary | ICD-10-CM

## 2023-06-07 DIAGNOSIS — Z7401 Bed confinement status: Secondary | ICD-10-CM | POA: Diagnosis not present

## 2023-06-07 DIAGNOSIS — Z95828 Presence of other vascular implants and grafts: Secondary | ICD-10-CM

## 2023-06-07 DIAGNOSIS — N400 Enlarged prostate without lower urinary tract symptoms: Secondary | ICD-10-CM | POA: Diagnosis present

## 2023-06-07 DIAGNOSIS — R627 Adult failure to thrive: Secondary | ICD-10-CM | POA: Diagnosis not present

## 2023-06-07 DIAGNOSIS — I214 Non-ST elevation (NSTEMI) myocardial infarction: Secondary | ICD-10-CM | POA: Diagnosis present

## 2023-06-07 DIAGNOSIS — C787 Secondary malignant neoplasm of liver and intrahepatic bile duct: Secondary | ICD-10-CM | POA: Diagnosis present

## 2023-06-07 DIAGNOSIS — E559 Vitamin D deficiency, unspecified: Secondary | ICD-10-CM | POA: Diagnosis present

## 2023-06-07 DIAGNOSIS — E785 Hyperlipidemia, unspecified: Secondary | ICD-10-CM | POA: Diagnosis not present

## 2023-06-07 DIAGNOSIS — Z87891 Personal history of nicotine dependence: Secondary | ICD-10-CM

## 2023-06-07 DIAGNOSIS — L8990 Pressure ulcer of unspecified site, unspecified stage: Secondary | ICD-10-CM | POA: Diagnosis present

## 2023-06-07 DIAGNOSIS — I959 Hypotension, unspecified: Secondary | ICD-10-CM | POA: Diagnosis not present

## 2023-06-07 DIAGNOSIS — R0689 Other abnormalities of breathing: Secondary | ICD-10-CM | POA: Diagnosis not present

## 2023-06-07 DIAGNOSIS — R001 Bradycardia, unspecified: Secondary | ICD-10-CM | POA: Diagnosis not present

## 2023-06-07 DIAGNOSIS — R531 Weakness: Secondary | ICD-10-CM | POA: Diagnosis not present

## 2023-06-07 DIAGNOSIS — Z8547 Personal history of malignant neoplasm of testis: Secondary | ICD-10-CM

## 2023-06-07 DIAGNOSIS — R54 Age-related physical debility: Secondary | ICD-10-CM | POA: Diagnosis present

## 2023-06-07 LAB — URINALYSIS, W/ REFLEX TO CULTURE (INFECTION SUSPECTED)
Bacteria, UA: NONE SEEN
Bilirubin Urine: NEGATIVE
Glucose, UA: NEGATIVE mg/dL
Hgb urine dipstick: NEGATIVE
Ketones, ur: 5 mg/dL — AB
Leukocytes,Ua: NEGATIVE
Nitrite: NEGATIVE
Protein, ur: 100 mg/dL — AB
Specific Gravity, Urine: 1.021 (ref 1.005–1.030)
pH: 5 (ref 5.0–8.0)

## 2023-06-07 LAB — COMPREHENSIVE METABOLIC PANEL WITH GFR
ALT: 31 U/L (ref 0–44)
AST: 63 U/L — ABNORMAL HIGH (ref 15–41)
Albumin: 2.4 g/dL — ABNORMAL LOW (ref 3.5–5.0)
Alkaline Phosphatase: 114 U/L (ref 38–126)
Anion gap: 11 (ref 5–15)
BUN: 18 mg/dL (ref 8–23)
CO2: 17 mmol/L — ABNORMAL LOW (ref 22–32)
Calcium: 9.1 mg/dL (ref 8.9–10.3)
Chloride: 109 mmol/L (ref 98–111)
Creatinine, Ser: 1.18 mg/dL (ref 0.61–1.24)
GFR, Estimated: >60 mL/min (ref >60)
Glucose, Bld: 94 mg/dL (ref 70–99)
Potassium: 3.7 mmol/L (ref 3.5–5.1)
Sodium: 137 mmol/L (ref 135–145)
Total Bilirubin: 1.1 mg/dL (ref 0.0–1.2)
Total Protein: 6.2 g/dL — ABNORMAL LOW (ref 6.5–8.1)

## 2023-06-07 LAB — HEMOGLOBIN A1C
Hgb A1c MFr Bld: 4.1 % — ABNORMAL LOW (ref 4.8–5.6)
Mean Plasma Glucose: 70.97 mg/dL

## 2023-06-07 LAB — RESPIRATORY PANEL BY PCR

## 2023-06-07 LAB — CBC WITH DIFFERENTIAL/PLATELET
Abs Immature Granulocytes: 0 10*3/uL (ref 0.00–0.07)
Basophils Absolute: 0 10*3/uL (ref 0.0–0.1)
Basophils Relative: 0 %
Eosinophils Absolute: 0 10*3/uL (ref 0.0–0.5)
Eosinophils Relative: 0 %
HCT: 33 % — ABNORMAL LOW (ref 39.0–52.0)
Hemoglobin: 10.6 g/dL — ABNORMAL LOW (ref 13.0–17.0)
Lymphocytes Relative: 4 %
Lymphs Abs: 0.5 10*3/uL — ABNORMAL LOW (ref 0.7–4.0)
MCH: 33.1 pg (ref 26.0–34.0)
MCHC: 32.1 g/dL (ref 30.0–36.0)
MCV: 103.1 fL — ABNORMAL HIGH (ref 80.0–100.0)
Monocytes Absolute: 0.8 10*3/uL (ref 0.1–1.0)
Monocytes Relative: 7 %
Neutro Abs: 10.2 10*3/uL — ABNORMAL HIGH (ref 1.7–7.7)
Neutrophils Relative %: 89 %
Platelets: 267 10*3/uL (ref 150–400)
RBC: 3.2 MIL/uL — ABNORMAL LOW (ref 4.22–5.81)
RDW: 19 % — ABNORMAL HIGH (ref 11.5–15.5)
WBC: 11.5 10*3/uL — ABNORMAL HIGH (ref 4.0–10.5)
nRBC: 3.9 % — ABNORMAL HIGH (ref 0.0–0.2)
nRBC: 8 /100{WBCs} — ABNORMAL HIGH

## 2023-06-07 LAB — BLOOD GAS, VENOUS
Acid-base deficit: 2.8 mmol/L — ABNORMAL HIGH (ref 0.0–2.0)
Bicarbonate: 20.8 mmol/L (ref 20.0–28.0)
Drawn by: 53715
O2 Saturation: 49.5 %
Patient temperature: 36.6
pCO2, Ven: 31 mmHg — ABNORMAL LOW (ref 44–60)
pH, Ven: 7.43 (ref 7.25–7.43)
pO2, Ven: 32 mmHg (ref 32–45)

## 2023-06-07 LAB — RESP PANEL BY RT-PCR (RSV, FLU A&B, COVID)  RVPGX2
Influenza A by PCR: NEGATIVE
Influenza B by PCR: NEGATIVE
Resp Syncytial Virus by PCR: NEGATIVE
SARS Coronavirus 2 by RT PCR: NEGATIVE

## 2023-06-07 LAB — PROTIME-INR
INR: 1.3 — ABNORMAL HIGH (ref 0.8–1.2)
Prothrombin Time: 16.5 s — ABNORMAL HIGH (ref 11.4–15.2)

## 2023-06-07 LAB — GLUCOSE, CAPILLARY
Glucose-Capillary: 88 mg/dL (ref 70–99)
Glucose-Capillary: 108 mg/dL — ABNORMAL HIGH (ref 70–99)
Glucose-Capillary: 95 mg/dL (ref 70–99)

## 2023-06-07 LAB — TYPE AND SCREEN
ABO/RH(D): O POS
Antibody Screen: NEGATIVE

## 2023-06-07 LAB — HEPARIN LEVEL (UNFRACTIONATED): Heparin Unfractionated: 0.29 [IU]/mL — ABNORMAL LOW (ref 0.30–0.70)

## 2023-06-07 LAB — LACTIC ACID, PLASMA
Lactic Acid, Venous: 5.9 mmol/L (ref 0.5–1.9)
Lactic Acid, Venous: 4.7 mmol/L (ref 0.5–1.9)

## 2023-06-07 LAB — TROPONIN I (HIGH SENSITIVITY)
Troponin I (High Sensitivity): 784 ng/L (ref <18)
Troponin I (High Sensitivity): 1178 ng/L (ref <18)

## 2023-06-07 LAB — BRAIN NATRIURETIC PEPTIDE: B Natriuretic Peptide: 1301 pg/mL — ABNORMAL HIGH (ref 0.0–100.0)

## 2023-06-07 LAB — MRSA NEXT GEN BY PCR, NASAL: MRSA by PCR Next Gen: NOT DETECTED

## 2023-06-07 MED ORDER — VANCOMYCIN HCL 750 MG/150ML IV SOLN
750.0000 mg | INTRAVENOUS | Status: DC
  Filled 2023-06-07: qty 150

## 2023-06-07 MED ORDER — IPRATROPIUM BROMIDE 0.02 % IN SOLN
RESPIRATORY_TRACT | Status: AC
  Filled 2023-06-07: qty 2.5

## 2023-06-07 MED ORDER — LACTATED RINGERS IV BOLUS (SEPSIS)
1000.0000 mL | Freq: Once | INTRAVENOUS | Status: DC
  Administered 2023-06-07: 1000 mL via INTRAVENOUS

## 2023-06-07 MED ORDER — ENSURE ENLIVE PO LIQD: 237.0000 mL | Freq: Two times a day (BID) | ORAL | Status: DC

## 2023-06-07 MED ORDER — ORAL CARE MOUTH RINSE: 15.0000 mL | OROMUCOSAL | Status: DC | PRN

## 2023-06-07 MED ORDER — SODIUM CHLORIDE 0.9 % IV SOLN
2.0000 g | Freq: Two times a day (BID) | INTRAVENOUS | Status: DC
  Administered 2023-06-07 – 2023-06-08 (×2): 2 g via INTRAVENOUS
  Filled 2023-06-07 (×2): qty 12.5

## 2023-06-07 MED ORDER — ORAL CARE MOUTH RINSE
15.0000 mL | OROMUCOSAL | Status: DC
  Administered 2023-06-07 – 2023-06-09 (×6): 15 mL via OROMUCOSAL

## 2023-06-07 MED ORDER — FUROSEMIDE 10 MG/ML IJ SOLN
40.0000 mg | Freq: Once | INTRAMUSCULAR | Status: AC
  Administered 2023-06-07: 40 mg via INTRAVENOUS
  Filled 2023-06-07: qty 4

## 2023-06-07 MED ORDER — MILRINONE LACTATE IN DEXTROSE 20-5 MG/100ML-% IV SOLN
0.2500 ug/kg/min | INTRAVENOUS | Status: DC
  Administered 2023-06-07: 0.25 ug/kg/min via INTRAVENOUS
  Filled 2023-06-07 (×2): qty 100

## 2023-06-07 MED ORDER — ALBUTEROL SULFATE (2.5 MG/3ML) 0.083% IN NEBU
5.0000 mg | INHALATION_SOLUTION | Freq: Once | RESPIRATORY_TRACT | Status: AC
  Administered 2023-06-07: 5 mg via RESPIRATORY_TRACT

## 2023-06-07 MED ORDER — HEPARIN (PORCINE) 25000 UT/250ML-% IV SOLN
900.0000 [IU]/h | INTRAVENOUS | Status: DC
  Administered 2023-06-07: 750 [IU]/h via INTRAVENOUS
  Filled 2023-06-07: qty 250

## 2023-06-07 MED ORDER — VANCOMYCIN HCL IN DEXTROSE 1-5 GM/200ML-% IV SOLN
1000.0000 mg | Freq: Once | INTRAVENOUS | Status: DC
  Filled 2023-06-07: qty 200

## 2023-06-07 MED ORDER — DOCUSATE SODIUM 100 MG PO CAPS: 100.0000 mg | ORAL_CAPSULE | Freq: Two times a day (BID) | ORAL | Status: DC | PRN

## 2023-06-07 MED ORDER — VANCOMYCIN HCL 1250 MG/250ML IV SOLN
1250.0000 mg | Freq: Once | INTRAVENOUS | Status: AC
  Administered 2023-06-07: 1250 mg via INTRAVENOUS
  Filled 2023-06-07: qty 250

## 2023-06-07 MED ORDER — CHLORHEXIDINE GLUCONATE CLOTH 2 % EX PADS
6.0000 | MEDICATED_PAD | Freq: Every day | CUTANEOUS | Status: DC
  Administered 2023-06-07 – 2023-06-09 (×3): 6 via TOPICAL

## 2023-06-07 MED ORDER — PANTOPRAZOLE SODIUM 40 MG IV SOLR
40.0000 mg | INTRAVENOUS | Status: DC
  Administered 2023-06-07 – 2023-06-08 (×2): 40 mg via INTRAVENOUS
  Filled 2023-06-07 (×2): qty 10

## 2023-06-07 MED ORDER — HEPARIN BOLUS VIA INFUSION
3750.0000 [IU] | Freq: Once | INTRAVENOUS | Status: AC
  Administered 2023-06-07: 3750 [IU] via INTRAVENOUS

## 2023-06-07 MED ORDER — INSULIN ASPART 100 UNIT/ML IJ SOLN: 0.0000 [IU] | INTRAMUSCULAR | Status: DC

## 2023-06-07 MED ORDER — POTASSIUM CHLORIDE 10 MEQ/100ML IV SOLN: 10.0000 meq | INTRAVENOUS | Status: DC

## 2023-06-07 MED ORDER — IPRATROPIUM BROMIDE 0.02 % IN SOLN
0.5000 mg | Freq: Once | RESPIRATORY_TRACT | Status: AC
  Administered 2023-06-07: 0.5 mg via RESPIRATORY_TRACT

## 2023-06-07 MED ORDER — SODIUM CHLORIDE 0.9 % IV SOLN
2.0000 g | Freq: Once | INTRAVENOUS | Status: AC
  Administered 2023-06-07: 2 g via INTRAVENOUS
  Filled 2023-06-07: qty 12.5

## 2023-06-07 MED ORDER — ALBUTEROL SULFATE (2.5 MG/3ML) 0.083% IN NEBU
INHALATION_SOLUTION | RESPIRATORY_TRACT | Status: AC
  Filled 2023-06-07: qty 6

## 2023-06-07 MED ORDER — LACTATED RINGERS IV BOLUS
500.0000 mL | Freq: Once | INTRAVENOUS | Status: AC
Start: 2023-06-07 — End: 2023-06-07
  Administered 2023-06-07: 500 mL via INTRAVENOUS

## 2023-06-07 MED ORDER — POLYETHYLENE GLYCOL 3350 17 G PO PACK: 17.0000 g | PACK | Freq: Every day | ORAL | Status: DC | PRN

## 2023-06-07 MED ORDER — LIDOCAINE 5 % EX PTCH
1.0000 | MEDICATED_PATCH | Freq: Once | CUTANEOUS | Status: AC
  Administered 2023-06-08: 1 via TRANSDERMAL
  Filled 2023-06-07: qty 1

## 2023-06-07 MED ORDER — POTASSIUM CHLORIDE 10 MEQ/50ML IV SOLN
10.0000 meq | INTRAVENOUS | Status: AC
  Administered 2023-06-07 (×4): 10 meq via INTRAVENOUS
  Filled 2023-06-07 (×4): qty 50

## 2023-06-07 NOTE — ED Notes (Signed)
EDP and RT at bedside. 

## 2023-06-07 NOTE — H&P (Signed)
 NAME:  Nathaniel Hicks, MRN:  [id number], DOB:  13-Sep-1940, LOS: 0 ADMISSION DATE:  06/07/2023, CONSULTATION DATE:  5/26 REFERRING MD:  Dr. 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 Diagnosed back in 2023. He has been on multiple chemotherapies and radiation which have been aborted due to disease progression. Now on palliative weekly docetaxel  started 4/2. Recent admission to [facility] perforated peptic ulcer, which was managed non-operatively. Also carried diagnosis of failure to thrive. He was to have PEG placed, but in the setting of peptic ulcer he is not a candidate. He presented to [facility] ED 5/26 with complaints of shortness of breath and hypoxia via EMS. Oxygen  sats were in the 50's upon EMS arrival and were 80% on a NRB in the ED. CXR with diffuse interstitial prominence concerning for pulmonary edema.  Transferred to [facility] for ICU admission due to high oxygen  requirement. PCCM to admit.   Pertinent  Medical History   has a past medical history of Arthritis, BPH (benign prostatic hyperplasia), Difficult intubation, HOH (hard of hearing), Hyperlipidemia (11/02/2017), Hypertension, Hypertension (11/02/2017), 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HCC), and Vitamin D deficiency (11/02/2017).   Significant Hospital Events: Including procedures, antibiotic start and stop dates in addition to other pertinent events     Interim History / Subjective:    Objective    Blood pressure (!) 147/60, pulse (!) 101, temperature 97.9 F (36.6 C), temperature source Axillary, resp. rate (!) 40, height 5\' 11"  (1.803 m), weight 62.1 kg, SpO2 100%.       No intake or output data in the 24 hours ending 06/07/23 1419 Filed Weights   06/07/23 1300  Weight: 62.1 kg    Examination: General: Frail elderly appearing male in NAD HENT:  Protuberance below the R mandible. Otherwise normocephalic, atraumatic.  Lungs: Rales throughout.  Cardiovascular: RRR, no MRG. No peripheral edema. LVEF looks reduced on pocus. B lines.  Abdomen: Soft, NT, ND Extremities: No acute deformity. Moving all extremities.  Neuro: Alert, oriented, difficult to understand speech.   Resolved problem list   Assessment and Plan   Acute respiratory failure with hypoxia: likely due to CHF/pulmonary edema. Cannot rule out PNA or viral illness but workup thus far consistent with pulmonary edema - Admit to ICU - Keep sats > 95% currently 5L HFNC. - Diurese - Echo pending - Cardiology following - Check PCT - empiric antibiotics - BC and RVP pending  Cardiogenic shock - Lactic acid 6, co-ox 49 - Cardiology following - milrinone 0.25 - give an additional 40mg  lasix  tonight and supp K - trend lactic  NSTEMI: likely demand in the setting of cardiogenic shock. - trend troponins - Heparin  infusion until we can rule out ACS.  - Appreciate cards  Stage IV squamous cell carcinoma of the mouth Hx testicular cancer.  - hold chemo, docetaxel  can cause CHF - Palliative consulted - May not be survivable in light of new development.   Failure to thrive: met with PMT as [facility] during his admission earlier this month and changed code status to DNR. Confirms this to Dr. Felipe Horton. DNR, DNI.   Best Practice (right click and "Reselect all SmartList Selections" daily)   Diet/type: NPO DVT prophylaxis systemic heparin  Pressure ulcer(s): pressure ulcer assessment deferred  GI prophylaxis: PPI Lines: Central line -port Foley:  Yes, and  it is still needed Code Status:  DNR Last date of multidisciplinary goals of care discussion [ ]   Labs   CBC: Recent Labs  Lab 06/02/23 1118 06/07/23 1130  WBC 11.0* 11.5*  NEUTROABS 9.4* 10.2*  HGB 11.5* 10.6*  HCT 35.1* 33.0*  MCV 100.6* 103.1*  PLT 306 267    Basic Metabolic Panel: Recent Labs  Lab  06/02/23 1118 06/07/23 1130  NA 136 137  K 3.2* 3.7  CL 109 109  CO2 20* 17*  GLUCOSE 106* 94  BUN 10 18  CREATININE 0.61 1.18  CALCIUM 9.2 9.1  MG 1.7  --    GFR: Estimated Creatinine Clearance: 42.4 mL/min (by C-G formula based on SCr of 1.18 mg/dL). Recent Labs  Lab 06/02/23 1118 06/07/23 1130  WBC 11.0* 11.5*  LATICACIDVEN  --  5.9*    Liver Function Tests: Recent Labs  Lab 06/02/23 1118 06/07/23 1130  AST 47* 63*  ALT 24 31  ALKPHOS 71 114  BILITOT 0.9 1.1  PROT 5.8* 6.2*  ALBUMIN 2.2* 2.4*   No results for input(s): "LIPASE", "AMYLASE" in the last 168 hours. No results for input(s): "AMMONIA" in the last 168 hours.  ABG    Component Value Date/Time   PHART 7.412 07/14/2021 1501   PCO2ART 39.4 07/14/2021 1501   PO2ART 284 (H) 07/14/2021 1501   HCO3 26.5 05/29/2022 0106   TCO2 26 07/14/2021 1501   O2SAT 90.9 05/29/2022 0106     Coagulation Profile: Recent Labs  Lab 06/07/23 1130  INR 1.3*    Cardiac Enzymes: No results for input(s): "CKTOTAL", "CKMB", "CKMBINDEX", "TROPONINI" in the last 168 hours.  HbA1C: No results found for: "HGBA1C"  CBG: No results for input(s): "GLUCAP" in the last 168 hours.  Review of Systems:   Bolds are positive  Constitutional: weight loss, gain, night sweats, Fevers, chills, fatigue .  HEENT: headaches, Sore throat, sneezing, nasal congestion, post nasal drip, Difficulty swallowing, Tooth/dental problems, visual complaints visual changes, ear ache CV:  chest pain, radiates:,Orthopnea, PND, swelling in lower extremities, dizziness, palpitations, syncope.  GI  heartburn, indigestion, abdominal pain, nausea, vomiting, diarrhea, change in bowel habits, loss of appetite, bloody stools.  Resp: cough, productive: , hemoptysis, dyspnea, chest pain, pleuritic.  Skin: rash or itching or icterus GU: dysuria, change in color of urine, urgency or frequency. flank pain, hematuria  MS: joint pain or swelling. decreased range  of motion  Psych: change in mood or affect. depression or anxiety.  Neuro: difficulty with speech, weakness, numbness, ataxia    Past Medical History:  He,  has a past medical history of Arthritis, BPH (benign prostatic hyperplasia), Difficult intubation, HOH (hard of hearing), Hyperlipidemia (11/02/2017), Hypertension, Hypertension (11/02/2017), 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HCC), and Vitamin D deficiency (11/02/2017).   Surgical History:   Past Surgical History:  Procedure Laterality Date   COLONOSCOPY N/A 11/24/2012   Procedure: COLONOSCOPY;  Surgeon: Ruby Corporal, MD;  [facility];  Service: Endoscopy;  Laterality: N/A;  830-moved to 730 Ann notified pt   FLOOR OF MOUTH BIOPSY N/A 10/14/2021   Procedure: FLOOR OF MOUTH RESECTION;  Surgeon: Virgina Grills, MD;  Location: [facility] OR;  Service: ENT;  Laterality: N/A;   HEMORROIDECTOMY     KNEE ARTHROSCOPY WITH LATERAL MENISECTOMY Right 08/31/2017   Procedure: KNEE ARTHROSCOPY WITH LATERAL MENISECTOMY;  Surgeon: Darrin Emerald, MD;  Location: [facility];  Service: Orthopedics;  Laterality: Right;   LESION EXCISION N/A 03/23/2012   Procedure: EXCISION NEOPLASM SCALP ;  Surgeon: Beau Bound, MD;  Location: [facility];  Service: General;  Laterality: N/A;  Excision of Scalp Neoplasm   ORCHIECTOMY Right 12/20/2012   Procedure: RIGHT RADICAL ORCHIECTOMY/POSSIBLE BX RIGHT TESTICLE;  Surgeon: Reggie Caper, MD;  Location: [facility];  Service: Urology;  Laterality: Right;   PORTACATH PLACEMENT Left 03/25/2022   Procedure: INSERTION PORT-A-CATH;  Surgeon: Alanda Allegra, MD;  Location: [facility];  Service: General;  Laterality: Left;   PROSTATE SURGERY     RADICAL NECK DISSECTION Bilateral 10/14/2021   Procedure: NECK DISSECTION;  Surgeon: Virgina Grills, MD;  Location: [facility] OR;  Service: ENT;  Laterality: Bilateral;   SCALP LACERATION REPAIR     [facility]-Dr Felipe Horton   SKIN FULL THICKNESS GRAFT Bilateral 10/14/2021   Procedure: PLATYSMA FLAP CLOSURE;  Surgeon:  Virgina Grills, MD;  Location: [facility] OR;  Service: ENT;  Laterality: Bilateral;   TOOTH EXTRACTION  10/14/2021   Procedure: DENTAL EXTRACTIONS;  Surgeon: Virgina Grills, MD;  Location: [facility] OR;  Service: ENT;;   TRACHEOSTOMY TUBE PLACEMENT N/A 10/14/2021   Procedure: TRACHEOSTOMY;  Surgeon: Virgina Grills, MD;  Location: [facility] OR;  Service: ENT;  Laterality: N/A;     Social History:   reports that he has quit smoking. His smoking use included cigarettes. He has a 12.5 pack-year smoking history. He has never used smokeless tobacco. He reports that he does not currently use alcohol. He reports that he does not use drugs.   Family History:  His family history includes Cancer in his brother and brother.   Allergies No Known Allergies   Home Medications  Prior to Admission medications   Medication Sig Start Date End Date Taking? Authorizing Provider  acetaminophen  (TYLENOL ) 650 MG CR tablet Take 650 mg by mouth every 8 (eight) hours as needed for pain.    [provider]  amLODipine -olmesartan  (AZOR ) 5-20 MG tablet TAKE 1 TABLET BY MOUTH DAILY 09/22/22   Wert, Michael B, MD  amoxicillin -clavulanate (AUGMENTIN ) 875-125 MG tablet Take 1 tablet by mouth. 05/30/23 06/07/23  [provider]  budesonide -formoterol  (SYMBICORT ) 80-4.5 MCG/ACT inhaler Inhale 2 puffs into the lungs in the morning and at bedtime. 05/29/22   Kommor, Madison, MD  chlorhexidine  (PERIDEX ) 0.12 % solution Use as directed 5 mLs in the mouth or throat 2 (two) times daily. 04/13/23   [provider]  Cholecalciferol (VITAMIN D3) 50 MCG (2000 UT) TABS Take 2,000 Units by mouth daily.    [provider]  ciprofloxacin-dexamethasone  (CIPRODEX) OTIC suspension Place 4 drops into the left ear 2 (two) times daily. 11/18/22   [provider]  clindamycin  (CLINDAGEL) 1 % gel Apply topically 2 (two) times daily. Apply twice daily to chest rash 10/26/22   [name], MD  doxycycline  (VIBRA -TABS) 100 MG  tablet TAKE 1 TABLET(100 MG) BY MOUTH TWICE DAILY 04/06/23   [name], MD  HYDROcodone -acetaminophen  (HYCET) 7.5-325 mg/15 ml solution Take 15 mLs by mouth every 6 (six) hours as needed for moderate pain (pain score 4-6). 05/06/23   [name], MD  lidocaine  (XYLOCAINE ) 2 % solution Take by mouth. 07/30/22   [provider]  magnesium  oxide (MAG-OX) 400 MG tablet Take 2 tablets (800 mg total) by mouth 2 (two) times daily. 01/14/23   [name], MD  megestrol  (MEGACE ) 400 MG/10ML suspension Take 10 mLs (400 mg total) by mouth 2 (two) times daily. 04/29/23   [name], MD  Menthol, Topical Analgesic, (BIOFREEZE EX) Apply 1 application  topically daily as needed (pain).  [provider]  naloxone  (NARCAN ) nasal spray 4 mg/0.1 mL SMARTSIG:Both Nares 04/11/22   [provider]  omeprazole (PRILOSEC) 40 MG capsule Take 1 capsule (40 mg total) by mouth in the morning and at bedtime. 06/02/23   [name], MD  ondansetron  (ZOFRAN -ODT) 4 MG disintegrating tablet Take 1 tablet (4 mg total) by mouth every 8 (eight) hours as needed for nausea or vomiting. 05/13/23   Merdis Stalling, MD  Polyethyl Glycol-Propyl Glycol (GOODSENSE LUBRICANT EYE DROPS OP) Place 1 drop into both eyes daily as needed (dry eyes).    [provider]  pravastatin  (PRAVACHOL ) 80 MG tablet Take 80 mg by mouth daily.    [provider]  RAPAFLO 8 MG CAPS capsule Take 8 mg by mouth daily. Silodosin 10/13/13   [provider]  sucralfate (CARAFATE) 1 GM/10ML suspension Take 20 mLs (2 g total) by mouth 4 (four) times daily -  with meals and at bedtime. 06/02/23   [name], MD  vitamin B-12 (CYANOCOBALAMIN ) 500 MCG tablet Take 500 mcg by mouth daily.    [provider]     Critical care time: 46 min     Roz Cornelia, AGACNP-BC [facility]  See Amion for personal pager PCCM on call pager [phone] until 7pm. Please call Elink 7p-7a. [phone]  06/07/2023 5:24 PM

## 2023-06-07 NOTE — Sepsis Progress Note (Signed)
 Notified bedside nurse of need to draw repeat lactic acid.

## 2023-06-07 NOTE — ED Notes (Signed)
 V/O to not bolus pt fluids due to CHF

## 2023-06-07 NOTE — Plan of Care (Signed)

## 2023-06-07 NOTE — Sepsis Progress Note (Signed)
 Notified provider and bedside nurse of need to order and administer fluid bolus pt needs 1863 cc fluid.

## 2023-06-07 NOTE — Progress Notes (Addendum)
 Pharmacy Antibiotic Note  Nathaniel Hicks is a 83 y.o. male with pneumonia.  Pharmacy has been consulted for cefepime  and vancomycin  dosing.  -WBC= 11.5, afebrile, LA= 4.7 -SCr 1.1  -cefepime  2gm IV given ~ 11:30am today -vancomycin  1250mg  IV given   ~ 12:00pm today  Plan: -Cefepime  2gm IV q12h -Vancomycin  750mg  IV q24hr (estimated AUC 430 using SCr 1.1) -Will follow renal function, cultures and clinical progress    Height: 5\' 11"  (180.3 cm) Weight: 48.2 kg (106 lb 4.2 oz) IBW/kg (Calculated) : 75.3  Temp (24hrs), Avg:97.9 F (36.6 C), Min:97.9 F (36.6 C), Max:97.9 F (36.6 C)  Recent Labs  Lab 06/02/23 1118 06/07/23 1130 06/07/23 1541  WBC 11.0* 11.5*  --   CREATININE 0.61 1.18  --   LATICACIDVEN  --  5.9* 4.7*    Estimated Creatinine Clearance: 32.9 mL/min (by C-G formula based on SCr of 1.18 mg/dL).    No Known Allergies  Antimicrobials this admission: 5/26 cefepime  5/26 vancomycin    Dose adjustments this admission:   Microbiology results:   Thank you for allowing pharmacy to be a part of this patient's care.  Baxter Limber, PharmD Clinical Pharmacist **Pharmacist phone directory can now be found on [email or web address] ([email or web address]).  Listed under [facility].

## 2023-06-07 NOTE — ED Notes (Signed)
 Lab attempted blood cultures, no success  This RN attempted additional IV access, no success

## 2023-06-07 NOTE — Sepsis Progress Note (Signed)
 Elink monitoring for the code sepsis protocol.

## 2023-06-07 NOTE — Sepsis Progress Note (Signed)
 Repeat lactic acid was shown on sepsis list as being drawn @1423 . Pt transferred to 2M13 and now repeat lactic acid has disappeared off of list

## 2023-06-07 NOTE — Progress Notes (Signed)
 PHARMACY - ANTICOAGULATION CONSULT NOTE  Pharmacy Consult for Heparin  Indication: ACS  No Known Allergies  Patient Measurements: Height: 5\' 11"  (180.3 cm) Weight: 62.1 kg (136 lb 14.5 oz) IBW/kg (Calculated) : 75.3 HEPARIN  DW (KG): 62.1  Vital Signs: Temp: 97.9 F (36.6 C) (05/26 1151) Temp Source: Axillary (05/26 1151) BP: 138/62 (05/26 1230) Pulse Rate: 103 (05/26 1230)  Labs: Recent Labs    06/07/23 1130  HGB 10.6*  HCT 33.0*  PLT 267  LABPROT 16.5*  INR 1.3*  CREATININE 1.18  TROPONINIHS 784*    Estimated Creatinine Clearance: 42.4 mL/min (by C-G formula based on SCr of 1.18 mg/dL).   Medical History: Past Medical History:  Diagnosis Date   Arthritis    BPH (benign prostatic hyperplasia)    Difficult intubation    HOH (hard of hearing)    Hyperlipidemia 11/02/2017   Hypertension    Hypertension 11/02/2017   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HCC)    2014   Vitamin D deficiency 11/02/2017    Medications:  See med rec  Assessment: 83 year old male presents with shortness of breath and hypoxia . Troponins are elevated. No chest pain. Patient not on oral anticoagulants PTA. Pharmacy asked to start heparin   Goal of Therapy:  Heparin  level 0.3-0.7 units/ml Monitor platelets by anticoagulation protocol: Yes   Plan:  Give 3750 units bolus x 1 Start heparin  infusion at 750 units/hr Check anti-Xa level in ~8 hours and daily while on heparin  Continue to monitor H&H and platelets  Icy Fuhrmann, BS Pharm D, BCPS Clinical Pharmacist 06/07/2023,1:27 PM

## 2023-06-07 NOTE — ED Provider Notes (Signed)
 [place] EMERGENCY DEPARTMENT AT [facility] Provider Note   CSN: [id number] Arrival date & time: 06/07/23  1118     History  Chief Complaint  Patient presents with   Respiratory Distress    Nathaniel Hicks is a 83 y.o. male.  HPI 83 year old male presents with shortness of breath and hypoxia.  Initial history is from the paramedic who brought the patient in.  He called EMS from home and was found in his bed.  He is complaining of generalized weakness starting this morning.  EMS reports that his temp was 99 9 orally for them and his O2 sats were around 50% on room air.  Transiently came up on a nonrebreather but are currently in the 80s according to EMS.  Patient reports weakness this morning and dyspnea though he feels a little improved since EMS came.  However the patient is very hard to understand due to his chronic speech problems and so the history is somewhat limited.  He denies any pain including no headache or chest pain.  Home Medications Prior to Admission medications   Medication Sig Start Date End Date Taking? Authorizing Provider  acetaminophen  (TYLENOL ) 650 MG CR tablet Take 650 mg by mouth every 8 (eight) hours as needed for pain.    [provider]  amLODipine -olmesartan  (AZOR ) 5-20 MG tablet TAKE 1 TABLET BY MOUTH DAILY 09/22/22   Wert, Michael B, MD  amoxicillin -clavulanate (AUGMENTIN ) 875-125 MG tablet Take 1 tablet by mouth. 05/30/23 06/07/23  [provider]  budesonide -formoterol  (SYMBICORT ) 80-4.5 MCG/ACT inhaler Inhale 2 puffs into the lungs in the morning and at bedtime. 05/29/22   Kommor, Madison, MD  chlorhexidine  (PERIDEX ) 0.12 % solution Use as directed 5 mLs in the mouth or throat 2 (two) times daily. 04/13/23   [provider]  Cholecalciferol (VITAMIN D3) 50 MCG (2000 UT) TABS Take 2,000 Units by mouth daily.    [provider]  ciprofloxacin-dexamethasone  (CIPRODEX) OTIC suspension Place 4 drops into the left ear 2  (two) times daily. 11/18/22   [provider]  clindamycin  (CLINDAGEL) 1 % gel Apply topically 2 (two) times daily. Apply twice daily to chest rash 10/26/22   [name], MD  doxycycline  (VIBRA -TABS) 100 MG tablet TAKE 1 TABLET(100 MG) BY MOUTH TWICE DAILY 04/06/23   [name], MD  HYDROcodone -acetaminophen  (HYCET) 7.5-325 mg/15 ml solution Take 15 mLs by mouth every 6 (six) hours as needed for moderate pain (pain score 4-6). 05/06/23   [name], MD  lidocaine  (XYLOCAINE ) 2 % solution Take by mouth. 07/30/22   [provider]  magnesium  oxide (MAG-OX) 400 MG tablet Take 2 tablets (800 mg total) by mouth 2 (two) times daily. 01/14/23   [name], MD  megestrol  (MEGACE ) 400 MG/10ML suspension Take 10 mLs (400 mg total) by mouth 2 (two) times daily. 04/29/23   [name], MD  Menthol, Topical Analgesic, (BIOFREEZE EX) Apply 1 application  topically daily as needed (pain).    [provider]  naloxone  (NARCAN ) nasal spray 4 mg/0.1 mL SMARTSIG:Both Nares 04/11/22   [provider]  omeprazole (PRILOSEC) 40 MG capsule Take 1 capsule (40 mg total) by mouth in the morning and at bedtime. 06/02/23   [name], MD  ondansetron  (ZOFRAN -ODT) 4 MG disintegrating tablet Take 1 tablet (4 mg total) by mouth every 8 (eight) hours as needed for nausea or vomiting. 05/13/23   Merdis Stalling, MD  Polyethyl Glycol-Propyl Glycol (GOODSENSE LUBRICANT EYE DROPS OP) Place 1 drop  into both eyes daily as needed (dry eyes).    [provider]  pravastatin  (PRAVACHOL ) 80 MG tablet Take 80 mg by mouth daily.    [provider]  RAPAFLO 8 MG CAPS capsule Take 8 mg by mouth daily. Silodosin 10/13/13   [provider]  sucralfate (CARAFATE) 1 GM/10ML suspension Take 20 mLs (2 g total) by mouth 4 (four) times daily -  with meals and at bedtime. 06/02/23   [name], MD  vitamin B-12 (CYANOCOBALAMIN ) 500 MCG  tablet Take 500 mcg by mouth daily.    [provider]      Allergies    Patient has no known allergies.    Review of Systems   Review of Systems  Unable to perform ROS: Severe respiratory distress    Physical Exam Updated Vital Signs BP (!) 147/60   Pulse 100   Temp 97.9 F (36.6 C) (Axillary)   Resp (!) 34   Ht 5\' 11"  (1.803 m)   Wt 62.1 kg   SpO2 100%   BMI 19.09 kg/m  Physical Exam Vitals and nursing note reviewed.  Constitutional:      Appearance: He is well-developed.  HENT:     Head: Normocephalic and atraumatic.  Cardiovascular:     Rate and Rhythm: Regular rhythm. Tachycardia present.     Heart sounds: Normal heart sounds.  Pulmonary:     Effort: Tachypnea and accessory muscle usage present.     Breath sounds: Rhonchi present.  Abdominal:     Palpations: Abdomen is soft.     Tenderness: There is no abdominal tenderness.  Musculoskeletal:     Right lower leg: No edema.     Left lower leg: No edema.  Skin:    General: Skin is warm and dry.  Neurological:     Mental Status: He is alert.     Comments: Patient is very difficult to understand.  Otherwise he does seem to be responding well to questions and answering questions to his ability.  He has equal/normal strength in both upper extremities though both lower extremities are 3/5.     ED Results / Procedures / Treatments   Labs (all labs ordered are listed, but only abnormal results are displayed) Labs Reviewed  LACTIC ACID, PLASMA - Abnormal; Notable for the following components:      Result Value   Lactic Acid, Venous 5.9 (*)    All other components within normal limits  COMPREHENSIVE METABOLIC PANEL WITH GFR - Abnormal; Notable for the following components:   CO2 17 (*)    Total Protein 6.2 (*)    Albumin 2.4 (*)    AST 63 (*)    All other components within normal limits  CBC WITH DIFFERENTIAL/PLATELET - Abnormal; Notable for the following components:   WBC 11.5 (*)    RBC 3.20 (*)     Hemoglobin 10.6 (*)    HCT 33.0 (*)    MCV 103.1 (*)    RDW 19.0 (*)    nRBC 3.9 (*)    Neutro Abs 10.2 (*)    Lymphs Abs 0.5 (*)    nRBC 8 (*)    All other components within normal limits  PROTIME-INR - Abnormal; Notable for the following components:   Prothrombin Time 16.5 (*)    INR 1.3 (*)    All other components within normal limits  BRAIN NATRIURETIC PEPTIDE - Abnormal; Notable for the following components:   B Natriuretic Peptide 1,301.0 (*)  All other components within normal limits  TROPONIN I (HIGH SENSITIVITY) - Abnormal; Notable for the following components:   Troponin I (High Sensitivity) 784 (*)    All other components within normal limits  RESP PANEL BY RT-PCR (RSV, FLU A&B, COVID)  RVPGX2  CULTURE, BLOOD (ROUTINE X 2)  CULTURE, BLOOD (ROUTINE X 2)  LACTIC ACID, PLASMA  URINALYSIS, W/ REFLEX TO CULTURE (INFECTION SUSPECTED)  HEPARIN  LEVEL (UNFRACTIONATED)  I-STAT CHEM 8, ED  TROPONIN I (HIGH SENSITIVITY)    EKG EKG Interpretation Date/Time:  Monday Jun 07 2023 12:48:55 EDT Ventricular Rate:  98 PR Interval:  126 QRS Duration:  123 QT Interval:  464 QTC Calculation: 593 R Axis:   -49  Text Interpretation: Sinus rhythm Left bundle branch block Confirmed by Jerilynn Montenegro [phone]) on 06/07/2023 2:51:14 PM  Radiology DG Chest Port 1 View Result Date: 06/07/2023 CLINICAL DATA:  hypoxia EXAM: PORTABLE CHEST - 1 VIEW COMPARISON:  08/04/2022. FINDINGS: Cardiac silhouette is prominent. There is pulmonary interstitial prominence with vascular congestion. Right basilar consolidation may represent pneumonia. No pneumothorax or pleural effusion identified. Left-sided Port-A-Cath tip proximal SVC. IMPRESSION: Findings suggest CHF. Right base consolidation or volume loss. Electronically Signed   By: Sydell Eva M.D.   On: 06/07/2023 11:43    Procedures .Critical Care  Performed by: Jerilynn Montenegro, MD Authorized by: Jerilynn Montenegro, MD   Critical care  provider statement:    Critical care time (minutes):  45   Critical care time was exclusive of:  Separately billable procedures and treating other patients   Critical care was necessary to treat or prevent imminent or life-threatening deterioration of the following conditions:  Respiratory failure   Critical care was time spent personally by me on the following activities:  Development of treatment plan with patient or surrogate, discussions with consultants, evaluation of patient's response to treatment, examination of patient, ordering and review of laboratory studies, ordering and review of radiographic studies, ordering and performing treatments and interventions, pulse oximetry, re-evaluation of patient's condition and review of old charts     Medications Ordered in ED Medications  ipratropium (ATROVENT) 0.02 % nebulizer solution (  Not Given 06/07/23 1135)  albuterol  (PROVENTIL ) (2.5 MG/3ML) 0.083% nebulizer solution (  Not Given 06/07/23 1136)  heparin  bolus via infusion 3,750 Units (3,750 Units Intravenous Bolus from Bag 06/07/23 1428)    Followed by  heparin  ADULT infusion 100 units/mL (25000 units/250mL) (750 Units/hr Intravenous New Bag/Given 06/07/23 1425)  ceFEPIme  (MAXIPIME ) 2 g in sodium chloride  0.9 % 100 mL IVPB (0 g Intravenous Stopped 06/07/23 1210)  albuterol  (PROVENTIL ) (2.5 MG/3ML) 0.083% nebulizer solution 5 mg (5 mg Nebulization Given 06/07/23 1135)  ipratropium (ATROVENT) nebulizer solution 0.5 mg (0.5 mg Nebulization Given 06/07/23 1135)  vancomycin  (VANCOREADY) IVPB 1250 mg/250 mL (0 mg Intravenous Stopped 06/07/23 1341)  lactated ringers  bolus 500 mL (500 mLs Intravenous New Bag/Given 06/07/23 1312)  furosemide  (LASIX ) injection 40 mg (40 mg Intravenous Given 06/07/23 1431)    ED Course/ Medical Decision Making/ A&P                                 Medical Decision Making Amount and/or Complexity of Data Reviewed Independent Historian:     Details: Sisters External  Data Reviewed: notes. Labs: ordered.    Details: Mild leukocytosis.  Lactate 5.9, troponin 780.  BNP 1300. Radiology: ordered and independent interpretation performed.    Details: CHF and likely  pneumonia. ECG/medicine tests: ordered and independent interpretation performed.  Risk Prescription drug management. Decision regarding hospitalization.   Patient with respiratory distress.  He was taken off the nonrebreather and given a breathing treatment as well as put on nasal cannula oxygen .  His O2 sats are okay though he does still continue to have increased work of breathing.  Will hold off on BiPAP for now.  He was treated broadly for pneumonia with IV antibiotics.  He had a recent history of pneumoperitoneum over a week ago but his abdominal exam is benign and he denies any abdominal pain.  His x-ray is also concerning for CHF.  His troponin is elevated at 700 with a BNP of 1300.  Unclear if he had an MI causing CHF or the CHF is causing an elevated troponin.  I did discuss with cardiology, Dr. Stann Earnest, who agrees with IV heparin  and recommends an echo and then can consult cardiology as needed.  I tried a small bolus of fluids as it seems that he could be a mixed picture of pneumonia/sepsis versus CHF.  I discussed with Dr. Dione Franks of critical care and she accepts the patient to [facility] for critical care treatment.  She recommends a dose of Lasix  and thinks lactate might just be more endorgan damage from CHF.  He is not hypotensive.  He is maintaining his own airway and will try some high flow nasal cannula to see if this helps some with work of breathing, but at this time I do not think he needs intubation.  Will admit to the critical care unit.  Discussed this with patient and sisters at the bedside.  He is currently a full code.        Final Clinical Impression(s) / ED Diagnoses Final diagnoses:  Acute respiratory failure with hypoxia (HCC)  Acute congestive heart failure, unspecified  heart failure type [facility])    Rx / DC Orders ED Discharge Orders     None         Jerilynn Montenegro, MD 06/07/23 1507

## 2023-06-07 NOTE — Consult Note (Signed)
 WOC Nurse Consult Note: Reason for Consult: wounds B buttocks, R hip  Wound type: Deep Tissue Pressure Injuries B buttocks that appear to be evolving to Stage 2; R hip purple maroon discoloration evolving with some dark eschar forming  Pressure Injury POA: Yes Measurement: see nursing flowsheet  Wound bed: B buttocks with dark eschar peeling to reveal pink moist tissue (Stage 2), R hip purple maroon discoloration with developing dark eschar  Drainage (amount, consistency, odor) see nursing flowsheet  Periwound: intact  Dressing procedure/placement/frequency:  Cleanse R hip and B buttocks wounds with soap and water , dry and apply Xeroform gauze ([place] [phone]) to wound beds daily. Cover with silicone foam.  May lift foam to replace Xeroform daily, change foam q3 days and prn soiling.   POC discussed with bedside nurse. WOC team will not follow. Re-consult if further needs arise.   Thank you,    Ronni Colace MSN, RN-BC, [employer] [phone]

## 2023-06-07 NOTE — Progress Notes (Signed)
 eLink Physician-Brief Progress Note Patient Name: Nathaniel Hicks DOB: 11-Mar-1940 MRN: [id number]   Date of Service  06/07/2023  HPI/Events of Note  RN reports pt c/o  left neck pain -chronic on and off.Home meds list: on Narco. On milrinone. MAP 75. On heparin  drip.metastatic cancer. CHF. Sepsis. Cardiology notes reviewed.   asking for order for lidocaine  patch.   NPO. As per RN, pain is there   eICU Interventions  Lidocaine  patch ordered for now. Watch for arhythmia, qtc prolongations  Discussed with RN.      Intervention Category Intermediate Interventions: Pain - evaluation and management  [name] 06/07/2023, 11:47 PM  06:56 Troponin 2050, not doubling.ongoing rise in troponin discussed with RN. Cardiologist on board. Neck pain better.

## 2023-06-07 NOTE — ED Notes (Signed)
 ED TO INPATIENT HANDOFF REPORT  ED Nurse Name and Phone #:   S Name/Age/Gender Nathaniel Hicks 83 y.o. male Room/Bed: APA02/APA02  Code Status   Code Status: Prior  Home/SNF/Other Home Patient oriented to: self, place, time, and situation Is this baseline? Yes   Triage Complete: Triage complete  Chief Complaint Acute respiratory failure (HCC) [J96.00]  Triage Note Pt arrived via [facility] c/o Respiratory distress, per ems, family states he was unable to walk like normal and he is generally weak all over, Per EMS, pt SpO2 was 50% on RA and was then placed on a NRB at 15L and oxygen  came up to initially 95% and then dropped  into the 70s and 80s on NRB 15L. Pt has a Hx of mouth and liver cancer .    Allergies No Known Allergies  Level of Care/Admitting Diagnosis ED Disposition     ED Disposition  Admit   Condition  --   Comment  Hospital Area: [facility] [[id number]]  Level of Care: ICU [6]  May admit patient to [facility] or [facility] if equivalent level of care is available:: Yes  Interfacility transfer: Yes  Covid Evaluation: Confirmed COVID Negative  Diagnosis: Acute respiratory failure (HCC) [518.81.ICD-9-CM]  Admitting Physician: DESAI, NIKITA S [[id number]]  Attending Physician: DESAI, NIKITA S [[id number]]  Certification:: I certify this patient will need inpatient services for at least 2 midnights  Expected Medical Readiness: 06/11/2023          B Medical/Surgery History Past Medical History:  Diagnosis Date   Arthritis    BPH (benign prostatic hyperplasia)    Difficult intubation    HOH (hard of hearing)    Hyperlipidemia 11/02/2017   Hypertension    Hypertension 11/02/2017   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HCC)    2014   Vitamin D deficiency 11/02/2017   Past Surgical History:  Procedure Laterality Date   COLONOSCOPY N/A 11/24/2012   Procedure: COLONOSCOPY;  Surgeon: Ruby Corporal, MD;  [facility];  Service: Endoscopy;  Laterality:  N/A;  830-moved to 730 Ann notified pt   FLOOR OF MOUTH BIOPSY N/A 10/14/2021   Procedure: FLOOR OF MOUTH RESECTION;  Surgeon: Virgina Grills, MD;  Location[facility] OR;  Service: ENT;  Laterality: N/A;   HEMORROIDECTOMY     KNEE ARTHROSCOPY WITH LATERAL MENISECTOMY Right 08/31/2017   Procedure: KNEE ARTHROSCOPY WITH LATERAL MENISECTOMY;  Surgeon: Darrin Emerald, MD;  Location: [facility];  Service: Orthopedics;  Laterality: Right;   LESION EXCISION N/A 03/23/2012   Procedure: EXCISION NEOPLASM SCALP ;  Surgeon: Beau Bound, MD;  Location: [facility];  Service: General;  Laterality: N/A;  Excision of Scalp Neoplasm   ORCHIECTOMY Right 12/20/2012   Procedure: RIGHT RADICAL ORCHIECTOMY/POSSIBLE BX RIGHT TESTICLE;  Surgeon: Reggie Caper, MD;  Location: [facility];  Service: Urology;  Laterality: Right;   PORTACATH PLACEMENT Left 03/25/2022   Procedure: INSERTION PORT-A-CATH;  Surgeon: Alanda Allegra, MD;  Location: [facility];  Service: General;  Laterality: Left;   PROSTATE SURGERY     RADICAL NECK DISSECTION Bilateral 10/14/2021   Procedure: NECK DISSECTION;  Surgeon: Virgina Grills, MD;  Location: [facility] OR;  Service: ENT;  Laterality: Bilateral;   SCALP LACERATION REPAIR     [facility]-Dr Felipe Horton   SKIN FULL THICKNESS GRAFT Bilateral 10/14/2021   Procedure: PLATYSMA FLAP CLOSURE;  Surgeon: Virgina Grills, MD;  Location: [facility] OR;  Service: ENT;  Laterality: Bilateral;   TOOTH EXTRACTION  10/14/2021   Procedure: DENTAL  EXTRACTIONS;  Surgeon: Virgina Grills, MD;  Location: [facility] OR;  Service: ENT;;   TRACHEOSTOMY TUBE PLACEMENT N/A 10/14/2021   Procedure: TRACHEOSTOMY;  Surgeon: Virgina Grills, MD;  Location: [facility] OR;  Service: ENT;  Laterality: N/A;     A IV Location/Drains/Wounds Patient Lines/Drains/Airways Status     Active Line/Drains/Airways     Name Placement date Placement time Site Days   Implanted Port 03/25/22 Left Other (Comment) 03/25/22  0802  Other (Comment)  439   Tracheostomy Shiley Flexible 6 mm  Uncuffed 10/19/21  1035  6 mm  596            Intake/Output Last 24 hours No intake or output data in the 24 hours ending 06/07/23 1406  Labs/Imaging Results for orders placed or performed during the hospital encounter of 06/07/23 (from the past 48 hours)  Lactic acid, plasma     Status: Abnormal   Collection Time: 06/07/23 11:30 AM  Result Value Ref Range   Lactic Acid, Venous 5.9 (HH) 0.5 - 1.9 mmol/L    Comment: CRITICAL RESULT CALLED TO, READ BACK BY AND VERIFIED WITH M EANES AT 1225 ON [id number] BY S DALTON Performed at [facility], [address] [place], [place] [address]   Comprehensive metabolic panel     Status: Abnormal   Collection Time: 06/07/23 11:30 AM  Result Value Ref Range   Sodium 137 135 - 145 mmol/L   Potassium 3.7 3.5 - 5.1 mmol/L   Chloride 109 98 - 111 mmol/L   CO2 17 (L) 22 - 32 mmol/L   Glucose, Bld 94 70 - 99 mg/dL    Comment: Glucose reference range applies only to samples taken after fasting for at least 8 hours.   BUN 18 8 - 23 mg/dL   Creatinine, Ser 1.19 0.61 - 1.24 mg/dL   Calcium 9.1 8.9 - 14.7 mg/dL   Total Protein 6.2 (L) 6.5 - 8.1 g/dL   Albumin 2.4 (L) 3.5 - 5.0 g/dL   AST 63 (H) 15 - 41 U/L   ALT 31 0 - 44 U/L   Alkaline Phosphatase 114 38 - 126 U/L   Total Bilirubin 1.1 0.0 - 1.2 mg/dL   GFR, Estimated >82 >95 mL/min    Comment: (NOTE) Calculated using the CKD-EPI Creatinine Equation (2021)    Anion gap 11 5 - 15    Comment: Performed at [facility], [address] [place], [place] [address]  CBC with Differential     Status: Abnormal   Collection Time: 06/07/23 11:30 AM  Result Value Ref Range   WBC 11.5 (H) 4.0 - 10.5 K/uL    Comment: WHITE COUNT CONFIRMED ON SMEAR   RBC 3.20 (L) 4.22 - 5.81 MIL/uL   Hemoglobin 10.6 (L) 13.0 - 17.0 g/dL   HCT 86.5 (L) 78.4 - 69.6 %   MCV 103.1 (H) 80.0 - 100.0 fL   MCH 33.1 26.0 - 34.0 pg   MCHC 32.1 30.0 - 36.0 g/dL   RDW 29.5 (H) 28.4 - 13.2 %   Platelets 267 150 - 400 K/uL   nRBC  3.9 (H) 0.0 - 0.2 %   Neutrophils Relative % 89 %   Neutro Abs 10.2 (H) 1.7 - 7.7 K/uL   Lymphocytes Relative 4 %   Lymphs Abs 0.5 (L) 0.7 - 4.0 K/uL   Monocytes Relative 7 %   Monocytes Absolute 0.8 0.1 - 1.0 K/uL   Eosinophils Relative 0 %   Eosinophils Absolute 0.0 0.0 - 0.5 K/uL  Basophils Relative 0 %   Basophils Absolute 0.0 0.0 - 0.1 K/uL   WBC Morphology MORPHOLOGY UNREMARKABLE    RBC Morphology See Note     Comment: NRBCS PRESENT   Smear Review MORPHOLOGY UNREMARKABLE    nRBC 8 (H) 0 /100 WBC   Abs Immature Granulocytes 0.00 0.00 - 0.07 K/uL   Polychromasia PRESENT     Comment: Performed at [facility], [address], [place], [place] [address]  Protime-INR     Status: Abnormal   Collection Time: 06/07/23 11:30 AM  Result Value Ref Range   Prothrombin Time 16.5 (H) 11.4 - 15.2 seconds   INR 1.3 (H) 0.8 - 1.2    Comment: (NOTE) INR goal varies based on device and disease states. Performed at [facility], [address], [place], [place] [address]   Brain natriuretic peptide     Status: Abnormal   Collection Time: 06/07/23 11:30 AM  Result Value Ref Range   B Natriuretic Peptide 1,301.0 (H) 0.0 - 100.0 pg/mL    Comment: Performed at [facility], [address] [place], [place] [address]  Troponin I (High Sensitivity)     Status: Abnormal   Collection Time: 06/07/23 11:30 AM  Result Value Ref Range   Troponin I (High Sensitivity) 784 (HH) <18 ng/L    Comment: CRITICAL RESULT CALLED TO, READ BACK BY AND VERIFIED WITH M EANES AT 1225 ON [id number] BY S DALTON (NOTE) Elevated high sensitivity troponin I (hsTnI) values and significant  changes across serial measurements may suggest ACS but many other  chronic and acute conditions are known to elevate hsTnI results.  Refer to the "Links" section for chest pain algorithms and additional  guidance. Performed at [facility], [address] [place], [place] [address]   Resp panel by RT-PCR (RSV, Flu A&B, Covid) Anterior  Nasal Swab     Status: None   Collection Time: 06/07/23 12:00 PM   Specimen: Anterior Nasal Swab  Result Value Ref Range   SARS Coronavirus 2 by RT PCR NEGATIVE NEGATIVE    Comment: (NOTE) SARS-CoV-2 target nucleic acids are NOT DETECTED.  The SARS-CoV-2 RNA is generally detectable in upper respiratory specimens during the acute phase of infection. The lowest concentration of SARS-CoV-2 viral copies this assay can detect is 138 copies/mL. A negative result does not preclude SARS-Cov-2 infection and should not be used as the sole basis for treatment or other patient management decisions. A negative result may occur with  improper specimen collection/handling, submission of specimen other than nasopharyngeal swab, presence of viral mutation(s) within the areas targeted by this assay, and inadequate number of viral copies(<138 copies/mL). A negative result must be combined with clinical observations, patient history, and epidemiological information. The expected result is Negative.  Fact Sheet for Patients:  BloggerCourse.com  Fact Sheet for Healthcare Providers:  SeriousBroker.it  This test is no t yet approved or cleared by the [place]  FDA and  has been authorized for detection and/or diagnosis of SARS-CoV-2 by FDA under an Emergency Use Authorization (EUA). This EUA will remain  in effect (meaning this test can be used) for the duration of the COVID-19 declaration under Section 564(b)(1) of the Act, 21 U.S.C.section 360bbb-3(b)(1), unless the authorization is terminated  or revoked sooner.       Influenza A by PCR NEGATIVE NEGATIVE   Influenza B by PCR NEGATIVE NEGATIVE    Comment: (NOTE) The Xpert Xpress SARS-CoV-2/FLU/RSV plus assay is intended as an aid in the diagnosis of influenza  from Nasopharyngeal swab specimens and should not be used as a sole basis for treatment. Nasal washings and aspirates are unacceptable  for Xpert Xpress SARS-CoV-2/FLU/RSV testing.  Fact Sheet for Patients: BloggerCourse.com  Fact Sheet for Healthcare Providers: SeriousBroker.it  This test is not yet approved or cleared by the [place]  FDA and has been authorized for detection and/or diagnosis of SARS-CoV-2 by FDA under an Emergency Use Authorization (EUA). This EUA will remain in effect (meaning this test can be used) for the duration of the COVID-19 declaration under Section 564(b)(1) of the Act, 21 U.S.C. section 360bbb-3(b)(1), unless the authorization is terminated or revoked.     Resp Syncytial Virus by PCR NEGATIVE NEGATIVE    Comment: (NOTE) Fact Sheet for Patients: BloggerCourse.com  Fact Sheet for Healthcare Providers: SeriousBroker.it  This test is not yet approved or cleared by the [place]  FDA and has been authorized for detection and/or diagnosis of SARS-CoV-2 by FDA under an Emergency Use Authorization (EUA). This EUA will remain in effect (meaning this test can be used) for the duration of the COVID-19 declaration under Section 564(b)(1) of the Act, 21 U.S.C. section 360bbb-3(b)(1), unless the authorization is terminated or revoked.  Performed at [facility], [address], [place], [place] [address]    DG Chest Port 1 View Result Date: 06/07/2023 CLINICAL DATA:  hypoxia EXAM: PORTABLE CHEST - 1 VIEW COMPARISON:  08/04/2022. FINDINGS: Cardiac silhouette is prominent. There is pulmonary interstitial prominence with vascular congestion. Right basilar consolidation may represent pneumonia. No pneumothorax or pleural effusion identified. Left-sided Port-A-Cath tip proximal SVC. IMPRESSION: Findings suggest CHF. Right base consolidation or volume loss. Electronically Signed   By: Sydell Eva M.D.   On: 06/07/2023 11:43    Pending Labs Unresulted Labs (From admission, onward)      Start     Ordered   06/08/23 0500  Heparin  level (unfractionated)  Daily,   R      06/07/23 1335   06/08/23 0500  CBC  Daily,   R      06/07/23 1335   06/07/23 2130  Heparin  level (unfractionated)  ONCE - URGENT,   URGENT        06/07/23 1335   06/07/23 1128  Lactic acid, plasma  (Septic presentation on arrival (screening labs, nursing and treatment orders for obvious sepsis))  STAT Now then every 2 hours,   R (with STAT occurrences)      06/07/23 1128   06/07/23 1128  Blood Culture (routine x 2)  (Septic presentation on arrival (screening labs, nursing and treatment orders for obvious sepsis))  BLOOD CULTURE X 2,   STAT      06/07/23 1128   06/07/23 1128  Urinalysis, w/ Reflex to Culture (Infection Suspected) -Urine, Clean Catch  (Septic presentation on arrival (screening labs, nursing and treatment orders for obvious sepsis))  ONCE - URGENT,   URGENT       Question:  Specimen Source  Answer:  Urine, Clean Catch   06/07/23 1128            Vitals/Pain Today's Vitals   06/07/23 1230 06/07/23 1300 06/07/23 1315 06/07/23 1330  BP: 138/62  (!) 153/60 (!) 147/60  Pulse: (!) 103  94 (!) 101  Resp: (!) 30  (!) 32 (!) 40  Temp:      TempSrc:      SpO2: 100%  100% 100%  Weight:  136 lb 14.5 oz (62.1 kg)    Height:  5\' 11"  (1.803 m)  Isolation Precautions No active isolations  Medications Medications  ipratropium (ATROVENT) 0.02 % nebulizer solution (  Not Given 06/07/23 1135)  albuterol  (PROVENTIL ) (2.5 MG/3ML) 0.083% nebulizer solution (  Not Given 06/07/23 1136)  heparin  bolus via infusion 3,750 Units (has no administration in time range)    Followed by  heparin  ADULT infusion 100 units/mL (25000 units/250mL) (has no administration in time range)  furosemide  (LASIX ) injection 40 mg (has no administration in time range)  ceFEPIme  (MAXIPIME ) 2 g in sodium chloride  0.9 % 100 mL IVPB (0 g Intravenous Stopped 06/07/23 1210)  albuterol  (PROVENTIL ) (2.5 MG/3ML) 0.083% nebulizer  solution 5 mg (5 mg Nebulization Given 06/07/23 1135)  ipratropium (ATROVENT) nebulizer solution 0.5 mg (0.5 mg Nebulization Given 06/07/23 1135)  vancomycin  (VANCOREADY) IVPB 1250 mg/250 mL (1,250 mg Intravenous New Bag/Given 06/07/23 1211)  lactated ringers  bolus 500 mL (500 mLs Intravenous New Bag/Given 06/07/23 1312)    Mobility non-ambulatory     Focused Assessments    R Recommendations: See Admitting Provider Note  Report given to:   Additional Notes:

## 2023-06-07 NOTE — ED Notes (Signed)
 Probation officer noted skin breakdown  on pts sacral area in two different places, family states pt has been complaining about it hurting at home prior to this ED visit. Sacral pad applied and pt verbalized relief. Pt repositioned on side

## 2023-06-07 NOTE — ED Notes (Signed)
 Lab tech x 2 attempted blood draw, no success

## 2023-06-07 NOTE — Consult Note (Signed)
 CARDIOLOGY CONSULT NOTE       Patient ID: Nathaniel Hicks MRN: [id number] DOB/AGE: 83-Jan-1942 82 y.o.  Admit date: 06/07/2023 Referring Physician: Aldean Amass [facility] Primary Physician: Omie Bickers, MD Primary Cardiologist: None Reason for Consultation: Elevated troponin  Principal Problem:   Acute respiratory failure [facility]) Active Problems:   Acute on chronic combined systolic and diastolic CHF (congestive heart failure) (HCC)   HPI:  83 y.o. came to [facility] ED and transferred to [facility] CCM for respiratory distress, sepsis, FTT, and metastatic squamous cell oral cancer. No cardiac history. Lives alone. Difficult to understand due to speech impediment Denies chest pain on my exam. Trponin 784-> 1178. No acute ST elevation or ECG changes PVC and ICLBBB. He is tachypnic with lactate 5.9, BNP 1301, WBC 11.5 and Hct 33. Respiratory panel negative CXR with CHF and right base consolidation and volume loss. He is a palliative patient/hospice. Known liver mets. Poor oral intake and unable to get PEG due to prior PUD.   ROS All other systems reviewed and negative except as noted above  Past Medical History:  Diagnosis Date   Arthritis    BPH (benign prostatic hyperplasia)    Difficult intubation    HOH (hard of hearing)    Hyperlipidemia 11/02/2017   Hypertension    Hypertension 11/02/2017   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HCC)    2014   Vitamin D deficiency 11/02/2017    Family History  Problem Relation Age of Onset   Cancer Brother    Cancer Brother     Social History   Socioeconomic History   Marital status: Single    Spouse name: Not on file   Number of children: Not on file   Years of education: Not on file   Highest education level: Not on file  Occupational History   Not on file  Tobacco Use   Smoking status: Former    Current packs/day: 0.25    Average packs/day: 0.3 packs/day for 50.0 years (12.5 ttl pk-yrs)    Types: Cigarettes   Smokeless tobacco: Never  Vaping Use   Vaping  status: Never Used  Substance and Sexual Activity   Alcohol use: Not Currently   Drug use: No   Sexual activity: Yes    Birth control/protection: None  Other Topics Concern   Not on file  Social History Narrative   Not on file   Social Drivers of Health   Financial Resource Strain: Not on file  Food Insecurity: Not on file  Transportation Needs: Not on file  Physical Activity: Not on file  Stress: Not on file  Social Connections: Not on file  Intimate Partner Violence: Not on file    Past Surgical History:  Procedure Laterality Date   COLONOSCOPY N/A 11/24/2012   Procedure: COLONOSCOPY;  Surgeon: Ruby Corporal, MD;  Location: [facility] SUITE;  Service: Endoscopy;  Laterality: N/A;  830-moved to 730 Ann notified pt   FLOOR OF MOUTH BIOPSY N/A 10/14/2021   Procedure: FLOOR OF MOUTH RESECTION;  Surgeon: Virgina Grills, MD;  Location: [facility] OR;  Service: ENT;  Laterality: N/A;   HEMORROIDECTOMY     KNEE ARTHROSCOPY WITH LATERAL MENISECTOMY Right 08/31/2017   Procedure: KNEE ARTHROSCOPY WITH LATERAL MENISECTOMY;  Surgeon: Darrin Emerald, MD;  Location: [facility] ORS;  Service: Orthopedics;  Laterality: Right;   LESION EXCISION N/A 03/23/2012   Procedure: EXCISION NEOPLASM SCALP ;  Surgeon: Beau Bound, MD;  Location: [facility] ORS;  Service: General;  Laterality: N/A;  Excision  of Scalp Neoplasm   ORCHIECTOMY Right 12/20/2012   Procedure: RIGHT RADICAL ORCHIECTOMY/POSSIBLE BX RIGHT TESTICLE;  Surgeon: Reggie Caper, MD;  Location: [facility] ORS;  Service: Urology;  Laterality: Right;   PORTACATH PLACEMENT Left 03/25/2022   Procedure: INSERTION PORT-A-CATH;  Surgeon: Alanda Allegra, MD;  Location: [facility] ORS;  Service: General;  Laterality: Left;   PROSTATE SURGERY     RADICAL NECK DISSECTION Bilateral 10/14/2021   Procedure: NECK DISSECTION;  Surgeon: Virgina Grills, MD;  Location: [facility] OR;  Service: ENT;  Laterality: Bilateral;   SCALP LACERATION REPAIR     [facility]-Dr Felipe Horton   SKIN FULL THICKNESS GRAFT  Bilateral 10/14/2021   Procedure: PLATYSMA FLAP CLOSURE;  Surgeon: Virgina Grills, MD;  Location: [facility] OR;  Service: ENT;  Laterality: Bilateral;   TOOTH EXTRACTION  10/14/2021   Procedure: DENTAL EXTRACTIONS;  Surgeon: Virgina Grills, MD;  Location: [facility] OR;  Service: ENT;;   TRACHEOSTOMY TUBE PLACEMENT N/A 10/14/2021   Procedure: TRACHEOSTOMY;  Surgeon: Virgina Grills, MD;  Location: [facility] OR;  Service: ENT;  Laterality: N/A;      Current Facility-Administered Medications:    albuterol  (PROVENTIL ) (2.5 MG/3ML) 0.083% nebulizer solution, , , ,    Chlorhexidine  Gluconate Cloth 2 % PADS 6 each, 6 each, Topical, Daily, Lemmie Pyo, NP, 6 each at 06/07/23 1527   [COMPLETED] heparin  bolus via infusion 3,750 Units, 3,750 Units, Intravenous, Once, 3,750 Units at 06/07/23 1428 **FOLLOWED BY** heparin  ADULT infusion 100 units/mL (25000 units/250mL), 750 Units/hr, Intravenous, Continuous, Jerilynn Montenegro, MD, Last Rate: 7.5 mL/hr at 06/07/23 1425, 750 Units/hr at 06/07/23 1425   ipratropium (ATROVENT) 0.02 % nebulizer solution, , , ,   albuterol        Chlorhexidine  Gluconate Cloth  6 each Topical Daily   ipratropium        heparin  750 Units/hr (06/07/23 1425)    Physical Exam: Blood pressure (!) 147/60, pulse 100, temperature 97.9 F (36.6 C), temperature source Axillary, resp. rate (!) 34, height 5\' 11"  (1.803 m), weight 48.2 kg, SpO2 100%.    Cachectic black male Port under left clavicle Diffuse rhonchi bilateral No murmur Abdomen tympanitic  Plus one bilateral edema Speech hard to understand   Labs:   Lab Results  Component Value Date   WBC 11.5 (H) 06/07/2023   HGB 10.6 (L) 06/07/2023   HCT 33.0 (L) 06/07/2023   MCV 103.1 (H) 06/07/2023   PLT 267 06/07/2023    Recent Labs  Lab 06/07/23 1130  NA 137  K 3.7  CL 109  CO2 17*  BUN 18  CREATININE 1.18  CALCIUM 9.1  PROT 6.2*  BILITOT 1.1  ALKPHOS 114  ALT 31  AST 63*  GLUCOSE 94   Lab Results  Component Value Date    CKTOTAL 142 08/08/2009   CKMB 2.5 08/08/2009   TROPONINI 0.01        NO INDICATION OF MYOCARDIAL INJURY. 08/08/2009   No results found for: "CHOL" No results found for: "HDL" No results found for: "LDLCALC" Lab Results  Component Value Date   TRIG 109 10/17/2021   TRIG 76 07/20/2021   TRIG 71 07/18/2021   No results found for: "CHOLHDL" No results found for: "LDLDIRECT"    Radiology: [facility] Chest Port 1 View Result Date: 06/07/2023 CLINICAL DATA:  hypoxia EXAM: PORTABLE CHEST - 1 VIEW COMPARISON:  08/04/2022. FINDINGS: Cardiac silhouette is prominent. There is pulmonary interstitial prominence with vascular congestion. Right basilar consolidation may represent pneumonia. No pneumothorax or pleural effusion identified. Left-sided  Port-A-Cath tip proximal SVC. IMPRESSION: Findings suggest CHF. Right base consolidation or volume loss. Electronically Signed   By: Sydell Eva M.D.   On: 06/07/2023 11:43   CT ABDOMEN PELVIS W CONTRAST Result Date: 05/13/2023 CLINICAL DATA:  Acute nonlocalized abdominal pain. Vomiting beginning last night. Vomiting after discharge from cancer treatment. EXAM: CT ABDOMEN AND PELVIS WITH CONTRAST TECHNIQUE: Multidetector CT imaging of the abdomen and pelvis was performed using the standard protocol following bolus administration of intravenous contrast. RADIATION DOSE REDUCTION: This exam was performed according to the departmental dose-optimization program which includes automated exposure control, adjustment of the mA and/or kV according to patient size and/or use of iterative reconstruction technique. CONTRAST:  OMNIPAQUE  IOHEXOL  300 MG/ML  SOLN COMPARISON:  PET-CT 04/08/2023.  MRI abdomen 01/07/2023 FINDINGS: Lower chest: Atelectasis or infiltration in the lung bases. Mild cardiac enlargement. Hepatobiliary: Focal heterogeneously hypoenhancing lesion in the right lobe of the liver measuring 5.4 x 6 cm. This corresponds to known neoplasm. Gallbladder and bile  ducts are normal. Pancreas: Unremarkable. No pancreatic ductal dilatation or surrounding inflammatory changes. Spleen: Normal in size without focal abnormality. Adrenals/Urinary Tract: No adrenal gland nodules. Symmetrical nephrograms. Stone in the lower pole right kidney measuring 5 mm diameter. No hydronephrosis or hydroureter. Bilateral renal cysts are unchanged since prior study. No imaging follow-up is indicated. Bladder is normal. Stomach/Bowel: Stomach, small bowel, and colon are not abnormally distended. No wall thickening or inflammatory changes. Stool throughout the colon. Appendix is not identified. Vascular/Lymphatic: Aortic atherosclerosis. No enlarged abdominal or pelvic lymph nodes. Reproductive: Prostate gland is enlarged, measuring 5.6 cm diameter. Other: No free air or free fluid in the abdomen. Abdominal wall musculature appears intact. Musculoskeletal: Degenerative changes in the spine. Old rib fractures and old fracture deformities of lumbar transverse processes. Degenerative changes in the hips. No acute destructive bone lesion. IMPRESSION: 1. Atelectasis in the lung bases. 2. Unchanged appearance of known focal liver lesion. 3. No evidence of bowel obstruction or inflammation. 4. Aortic atherosclerosis. 5. Enlarged prostate gland. 6. Nonobstructing stone in the right kidney. Electronically Signed   By: Boyce Byes M.D.   On: 05/13/2023 19:08    EKG: SR ICLBBB PVC no ST elevation   ASSESSMENT AND PLAN:   Elevated troponin:  no acute ECG changes. Clinically with some CHF. No chest pain. Check echo for EF/ Palliative hospice. Not a candidate for cath continue heparin  for 48 hours.  CHF:  TTE lasix  20 mg iv once now. Follow CXR and consider further Rx once EF known Oral cancer:  metastatic poor PO intake no PEG mets to liver. Abdomen tympanitic not tender CT abdomen 05/13/23 focal liver mets no bowel obstruction  Sepsis:  empiric coverage per CCM Trend lactate ? Pneumonia    Signed: Janelle Mediate 06/07/2023, 3:49 PM

## 2023-06-07 NOTE — Progress Notes (Addendum)
 PHARMACY - ANTICOAGULATION CONSULT NOTE  Pharmacy Consult for heparin  Indication: ACS  Labs: Recent Labs    06/07/23 1130 06/07/23 1328  HGB 10.6*  --   HCT 33.0*  --   PLT 267  --   LABPROT 16.5*  --   INR 1.3*  --   CREATININE 1.18  --   TROPONINIHS 784* 1,178*   Assessment: 82yo male slightly subtherapeutic on heparin  with initial dosing for elevated troponin (level 0.29, not crossing into Epic), plan for 48h of heparin ; no infusion issues or signs of bleeding per RN.  Goal of Therapy:  Heparin  level 0.3-0.7 units/ml   Plan:  Increase heparin  infusion by 1 unit/kg/hr to 800 units/hr. Check level with am labs.   Lonnie Roberts, PharmD, BCPS 06/07/2023 10:54 PM     Addendum: Repeat heparin  level now lower, 0.25.  Will increase heparin  gtt by 2 units/kg/hr to 900 units/hr and check level in 8 hours.    VB 06/08/2023 3:42 AM

## 2023-06-07 NOTE — Sepsis Progress Note (Signed)
 Notified bedside nurse of need to draw repeat lactic acid   phone call to [phone] RN.

## 2023-06-07 NOTE — ED Triage Notes (Signed)
 Pt arrived via [facility] c/o Respiratory distress, per ems, family states he was unable to walk like normal and he is generally weak all over, Per EMS, pt SpO2 was 50% on RA and was then placed on a NRB at 15L and oxygen  came up to initially 95% and then dropped  into the 70s and 80s on NRB 15L. Pt has a Hx of mouth and liver cancer .

## 2023-06-08 ENCOUNTER — Inpatient Hospital Stay (HOSPITAL_COMMUNITY)

## 2023-06-08 DIAGNOSIS — Z515 Encounter for palliative care: Secondary | ICD-10-CM

## 2023-06-08 DIAGNOSIS — R570 Cardiogenic shock: Secondary | ICD-10-CM

## 2023-06-08 DIAGNOSIS — Z7189 Other specified counseling: Secondary | ICD-10-CM | POA: Diagnosis not present

## 2023-06-08 DIAGNOSIS — R7989 Other specified abnormal findings of blood chemistry: Secondary | ICD-10-CM | POA: Diagnosis not present

## 2023-06-08 LAB — COOXEMETRY PANEL
Carboxyhemoglobin: 1.9 % — ABNORMAL HIGH (ref 0.5–1.5)
Methemoglobin: <0.7 % (ref 0.0–1.5)
O2 Saturation: 64.9 %
Total hemoglobin: 11.2 g/dL — ABNORMAL LOW (ref 12.0–16.0)

## 2023-06-08 LAB — PHOSPHORUS: Phosphorus: 2 mg/dL — ABNORMAL LOW (ref 2.5–4.6)

## 2023-06-08 LAB — ECHOCARDIOGRAM COMPLETE
AR max vel: 2.06 cm2
AV Area VTI: 1.74 cm2
AV Area mean vel: 1.51 cm2
AV Mean grad: 6 mmHg
AV Peak grad: 11.8 mmHg
Ao pk vel: 1.72 m/s
Calc EF: 24.7 %
Height: 71 in
S' Lateral: 5.2 cm
Single Plane A2C EF: 21 %
Single Plane A4C EF: 25.4 %
Weight: 1597.89 [oz_av]

## 2023-06-08 LAB — BASIC METABOLIC PANEL WITH GFR
Anion gap: 12 (ref 5–15)
BUN: 18 mg/dL (ref 8–23)
CO2: 22 mmol/L (ref 22–32)
Calcium: 9.4 mg/dL (ref 8.9–10.3)
Chloride: 112 mmol/L — ABNORMAL HIGH (ref 98–111)
Creatinine, Ser: 1.04 mg/dL (ref 0.61–1.24)
GFR, Estimated: >60 mL/min (ref >60)
Glucose, Bld: 105 mg/dL — ABNORMAL HIGH (ref 70–99)
Potassium: 3.8 mmol/L (ref 3.5–5.1)
Sodium: 146 mmol/L — ABNORMAL HIGH (ref 135–145)

## 2023-06-08 LAB — TROPONIN I (HIGH SENSITIVITY): Troponin I (High Sensitivity): 2050 ng/L (ref <18)

## 2023-06-08 LAB — PROCALCITONIN
Procalcitonin: 0.48 ng/mL
Procalcitonin: 0.43 ng/mL

## 2023-06-08 LAB — CBC
HCT: 34 % — ABNORMAL LOW (ref 39.0–52.0)
Hemoglobin: 11.1 g/dL — ABNORMAL LOW (ref 13.0–17.0)
MCH: 32.5 pg (ref 26.0–34.0)
MCHC: 32.6 g/dL (ref 30.0–36.0)
MCV: 99.4 fL (ref 80.0–100.0)
Platelets: 256 10*3/uL (ref 150–400)
RBC: 3.42 MIL/uL — ABNORMAL LOW (ref 4.22–5.81)
RDW: 19.2 % — ABNORMAL HIGH (ref 11.5–15.5)
WBC: 12 10*3/uL — ABNORMAL HIGH (ref 4.0–10.5)
nRBC: 6 % — ABNORMAL HIGH (ref 0.0–0.2)

## 2023-06-08 LAB — MAGNESIUM: Magnesium: 1.8 mg/dL (ref 1.7–2.4)

## 2023-06-08 LAB — BRAIN NATRIURETIC PEPTIDE: B Natriuretic Peptide: 804.3 pg/mL — ABNORMAL HIGH (ref 0.0–100.0)

## 2023-06-08 LAB — HEPARIN LEVEL (UNFRACTIONATED): Heparin Unfractionated: 0.25 [IU]/mL — ABNORMAL LOW (ref 0.30–0.70)

## 2023-06-08 LAB — GLUCOSE, CAPILLARY
Glucose-Capillary: 90 mg/dL (ref 70–99)
Glucose-Capillary: 93 mg/dL (ref 70–99)
Glucose-Capillary: 106 mg/dL — ABNORMAL HIGH (ref 70–99)

## 2023-06-08 MED ORDER — HALOPERIDOL LACTATE 2 MG/ML PO CONC: 0.5000 mg | ORAL | Status: DC | PRN

## 2023-06-08 MED ORDER — GLYCOPYRROLATE 0.2 MG/ML IJ SOLN
0.2000 mg | Freq: Two times a day (BID) | INTRAMUSCULAR | Status: DC
  Administered 2023-06-08 – 2023-06-09 (×3): 0.2 mg via INTRAVENOUS
  Filled 2023-06-08 (×3): qty 1

## 2023-06-08 MED ORDER — ENOXAPARIN SODIUM 60 MG/0.6ML IJ SOSY
50.0000 mg | PREFILLED_SYRINGE | Freq: Two times a day (BID) | INTRAMUSCULAR | Status: DC
  Administered 2023-06-08: 50 mg via SUBCUTANEOUS
  Filled 2023-06-08 (×2): qty 0.6

## 2023-06-08 MED ORDER — BISACODYL 10 MG RE SUPP: 10.0000 mg | Freq: Every day | RECTAL | Status: DC | PRN

## 2023-06-08 MED ORDER — ACETAMINOPHEN 650 MG RE SUPP: 650.0000 mg | Freq: Four times a day (QID) | RECTAL | Status: DC | PRN

## 2023-06-08 MED ORDER — HALOPERIDOL 0.5 MG PO TABS: 0.5000 mg | ORAL_TABLET | ORAL | Status: DC | PRN

## 2023-06-08 MED ORDER — GLYCOPYRROLATE 0.2 MG/ML IJ SOLN: 0.4000 mg | INTRAMUSCULAR | Status: DC | PRN

## 2023-06-08 MED ORDER — MORPHINE SULFATE (PF) 2 MG/ML IV SOLN
1.0000 mg | INTRAVENOUS | Status: DC | PRN
  Administered 2023-06-09: 2 mg via INTRAVENOUS
  Filled 2023-06-08: qty 1

## 2023-06-08 MED ORDER — HALOPERIDOL LACTATE 5 MG/ML IJ SOLN
0.5000 mg | INTRAMUSCULAR | Status: DC | PRN
Start: 2023-06-08 — End: 2023-06-09
  Administered 2023-06-09: 0.5 mg via INTRAVENOUS
  Filled 2023-06-08: qty 1

## 2023-06-08 MED ORDER — ONDANSETRON 4 MG PO TBDP: 4.0000 mg | ORAL_TABLET | Freq: Four times a day (QID) | ORAL | Status: DC | PRN

## 2023-06-08 MED ORDER — METHOCARBAMOL 1000 MG/10ML IJ SOLN
500.0000 mg | Freq: Four times a day (QID) | INTRAMUSCULAR | Status: DC | PRN
  Administered 2023-06-09: 500 mg via INTRAVENOUS
  Filled 2023-06-08: qty 10

## 2023-06-08 MED ORDER — BIOTENE DRY MOUTH MT LIQD: 15.0000 mL | OROMUCOSAL | Status: DC | PRN

## 2023-06-08 MED ORDER — FUROSEMIDE 10 MG/ML IJ SOLN
40.0000 mg | Freq: Once | INTRAMUSCULAR | Status: AC
  Administered 2023-06-08: 40 mg via INTRAVENOUS
  Filled 2023-06-08: qty 4

## 2023-06-08 MED ORDER — ACETAMINOPHEN 325 MG PO TABS
650.0000 mg | ORAL_TABLET | Freq: Four times a day (QID) | ORAL | Status: DC | PRN
Start: 2023-06-08 — End: 2023-06-09
  Administered 2023-06-09: 650 mg via ORAL
  Filled 2023-06-08: qty 2

## 2023-06-08 MED ORDER — POTASSIUM PHOSPHATES 15 MMOLE/5ML IV SOLN
15.0000 mmol | Freq: Once | INTRAVENOUS | Status: AC
  Administered 2023-06-08: 15 mmol via INTRAVENOUS
  Filled 2023-06-08: qty 5

## 2023-06-08 MED ORDER — ENOXAPARIN SODIUM 60 MG/0.6ML IJ SOSY: 1.0000 mg/kg | PREFILLED_SYRINGE | INTRAMUSCULAR | Status: DC

## 2023-06-08 MED ORDER — ONDANSETRON HCL 4 MG/2ML IJ SOLN: 4.0000 mg | Freq: Four times a day (QID) | INTRAMUSCULAR | Status: DC | PRN

## 2023-06-08 MED ORDER — POLYVINYL ALCOHOL 1.4 % OP SOLN: 1.0000 [drp] | Freq: Four times a day (QID) | OPHTHALMIC | Status: DC | PRN

## 2023-06-08 MED ORDER — VANCOMYCIN HCL 500 MG/100ML IV SOLN
500.0000 mg | INTRAVENOUS | Status: DC
  Filled 2023-06-08: qty 100

## 2023-06-08 MED ORDER — PERFLUTREN LIPID MICROSPHERE
1.0000 mL | INTRAVENOUS | Status: AC | PRN
  Administered 2023-06-08: 3 mL via INTRAVENOUS

## 2023-06-08 MED ORDER — ENSURE ENLIVE PO LIQD: 237.0000 mL | Freq: Two times a day (BID) | ORAL | Status: DC | PRN

## 2023-06-08 MED ORDER — MAGNESIUM SULFATE 2 GM/50ML IV SOLN
2.0000 g | Freq: Once | INTRAVENOUS | Status: AC
  Administered 2023-06-08: 2 g via INTRAVENOUS
  Filled 2023-06-08: qty 50

## 2023-06-08 NOTE — Progress Notes (Signed)
 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 CBC: Recent Labs    06/07/23 1130 06/08/23 0244  WBC 11.5* 12.0*  NEUTROABS 10.2*  --   HGB 10.6* 11.1*  HCT 33.0* 34.0*  MCV 103.1* 99.4  PLT 267 256   Cardiac Enzymes: No results for input(s): "CKTOTAL", "CKMB", "CKMBINDEX", "TROPONINI" in the last 72 hours. BNP: Invalid input(s): "POCBNP" D-Dimer: No results for input(s): "DDIMER" in the last 72 hours. Hemoglobin A1C: Recent Labs    06/07/23 1713  HGBA1C 4.1*    Imaging: DG Chest Port 1 View Result Date: 06/07/2023 CLINICAL DATA:  hypoxia EXAM: PORTABLE CHEST - 1 VIEW COMPARISON:  08/04/2022. FINDINGS: Cardiac silhouette is prominent. There is pulmonary interstitial prominence with vascular congestion. Right basilar  consolidation may represent pneumonia. No pneumothorax or pleural effusion identified. Left-sided Port-A-Cath tip proximal SVC. IMPRESSION: Findings suggest CHF. Right base consolidation or volume loss. Electronically Signed   By: Sydell Eva M.D.   On: 06/07/2023 11:43    Cardiac Studies:  ECG: SR PVC no acute ST changes    Telemetry:  NSR PVC  Echo:   Medications:    Chlorhexidine  Gluconate Cloth  6 each Topical Daily   enoxaparin  (LOVENOX ) injection  50 mg Subcutaneous Q12H   feeding supplement  237 mL Oral BID BM   insulin  aspart  0-9 Units Subcutaneous Q4H   lidocaine   1 patch Transdermal Once   mouth rinse  15 mL Mouth Rinse 4 times per day   pantoprazole  (PROTONIX ) IV  40 mg Intravenous Q24H      ceFEPime  (MAXIPIME ) IV 2 g (06/08/23 1047)   milrinone 0.25 mcg/kg/min (06/08/23 1000)   potassium PHOSPHATE IVPB (in mmol) 43 mL/hr at 06/08/23 1000    Assessment/Plan:   Elevated troponin:  in setting of palliative care for 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 Troponin peak 2050. TEE pending Not a cath candidate Can adjust medical Rx for reduced EF once echo done.  Not clear that milrinone is appropriate for this patient Coox is 64.9 no end point for titration and hospice/palliative patient  Sepsis:  ? Pneumonia on Maxipime    Janelle Mediate 06/08/2023, 11:04 AM

## 2023-06-08 NOTE — Consult Note (Signed)
 Consultation Note Date: 06/08/2023   Patient Name: Nathaniel Hicks  DOB: 1940/11/12  MRN: [id number]  Age / Sex: 83 y.o., male  PCP: Omie Bickers, MD Referring Physician: Arlina Lair, MD  Reason for Consultation: Establishing goals of care  HPI/Patient Profile: 83 y.o. 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   Clinical Assessment and Goals of Care: Consult received and thorough chart review completed. Reviewed palliative consultation and discussion from 5/15 at [facility] with noted decisions for DNR status and open to hospice if suffering or significant decline in QOL or function.   I met today with Mr. Venn but no family present. We reviewed his condition and decline. We reviewed our concerns for his poor prognosis and no good treatment options. We discussed the importance of considering the path forward. We discussed understanding that his time is limited and the importance of deciding how he wishes to spend his time. I explained that we are providing him with treatment here but nothing we are doing is going to fix him but just a Band-Aid that make things a little better temporarily. He does ask his other option. I explained that his other option would be focusing more on his comfort and treating his symptoms to feel as good as possible using the help from hospice. We discussed the hospice facility in [place] as this would be close to his family and friends to spend time with him. He wishes to discuss this further with his sisters.   Update: I met today at Mr. Oestreich bedside along with sisters  Aurora Blowers and Lagretta. We reviewed past and current medical diagnosis and challenges. We discussed limited options. We discussed in more detail comfort care and hospice option. They would like to continue oxygen  but agree with no further lab work and to wean off IV medications and remove monitor. He believes he will rest more easily without so many wires and cords. All agree with goal to pursue placement at hospice facility in [place] for end of life care. We discussed prognosis as likely days to weeks. We also discussed in detail plan for comfort feeds as well with risks vs benefits.   All questions/concerns addressed. Emotional support provided. Discussed with RN. Updated Dr. Agarwala and Dr. Nishan as well as CSW.   Primary Decision Maker PATIENT    SUMMARY OF RECOMMENDATIONS   - DNR/DNI - Comfort care (continue oxygen ) - Hopeful for placement in [facility] hospice  Code Status/Advance Care Planning: DNR   Symptom Management:  PRN medications added to ensure comfort. Titrate dosage/frequency as needed.   Psycho-social/Spiritual:  Desire for further Chaplaincy support:yes - he would like visit and prayer as we discussed  Prognosis:  Likely days.   Discharge Planning: Hospice facility  Primary Diagnoses: Present on Admission:  Acute respiratory failure (HCC)  Cardiogenic shock (HCC)   I have reviewed the medical record, interviewed the patient and family, and examined the patient. The following aspects are pertinent.  Past Medical History:  Diagnosis Date   Arthritis    BPH (benign prostatic hyperplasia)    Difficult intubation    HOH (hard of hearing)    Hyperlipidemia 11/02/2017   Hypertension    Hypertension 11/02/2017   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HCC)    2014   Vitamin D deficiency 11/02/2017   Social History   Socioeconomic History   Marital status: Single    Spouse name: Not on file   Number of children: Not on file   Years of education: Not on file    Highest education level: Not on file  Occupational History   Not on file  Tobacco Use   Smoking status: Former    Current packs/day: 0.25    Average packs/day: 0.3 packs/day for 50.0 years (12.5 ttl pk-yrs)    Types: Cigarettes   Smokeless tobacco: Never  Vaping Use   Vaping status: Never Used  Substance and Sexual Activity   Alcohol  use: Not Currently   Drug use: No   Sexual activity: Yes    Birth control/protection: None  Other Topics Concern   Not on file  Social History Narrative   Not on file   Social Drivers of Health   Financial Resource Strain: Not on file  Food Insecurity: No Food Insecurity (06/07/2023)   Hunger Vital Sign    Worried About Running Out of Food in the Last Year: Never true    Ran Out of Food in the Last Year: Never true  Transportation Needs: No Transportation Needs (06/07/2023)   PRAPARE - Administrator, Civil Service (Medical): No    Lack of Transportation (Non-Medical): No  Physical Activity: Not on file  Stress: Not on file  Social Connections: Unknown (06/07/2023)   Social Connection and Isolation Panel [NHANES]    Frequency of Communication with Friends and Family: More than three times a week    Frequency of Social Gatherings with Friends and Family: More than three times a week    Attends Religious Services: 1 to 4 times per year    Active Member of [employer] or Organizations: Patient declined    Attends Engineer, structural: Patient declined    Marital Status: Never married   Family History  Problem Relation Age of Onset   Cancer Brother    Cancer Brother    Scheduled Meds:  Chlorhexidine  Gluconate Cloth  6 each Topical Daily   feeding supplement  237 mL Oral BID BM   insulin  aspart  0-9 Units Subcutaneous Q4H   lidocaine   1 patch Transdermal Once   mouth rinse  15 mL Mouth Rinse 4 times per day   pantoprazole  (PROTONIX ) IV  40 mg Intravenous Q24H   Continuous Infusions:  ceFEPime  (MAXIPIME ) IV Stopped (06/07/23  2239)   heparin  900 Units/hr (06/08/23 0700)   milrinone  0.25 mcg/kg/min (06/08/23 0700)   potassium PHOSPHATE IVPB (in mmol) 15 mmol (06/08/23 0714)   vancomycin      PRN Meds:.docusate sodium , mouth rinse, polyethylene glycol No Known Allergies Review of Systems  Constitutional:  Positive for activity change, appetite change and fatigue.  HENT:  Positive for trouble swallowing.   Respiratory:  Positive for shortness of breath.   Musculoskeletal:  Positive for neck pain.  Neurological:  Positive for weakness.  Physical Exam Vitals and nursing note reviewed.  Cardiovascular:     Rate and Rhythm: Normal rate.  Pulmonary:     Comments: Winded with conversation Significant secretions - no managing well Abdominal:     Palpations: Abdomen is soft.  Neurological:     Mental Status: He is alert and oriented to person, place, and time.     Comments: Speech difficult to understand     Vital Signs: BP (!) 103/53   Pulse 97   Temp 97.8 F (36.6 C) (Axillary)   Resp (!) 29   Ht 5\' 11"  (1.803 m)   Wt 47.4 kg   SpO2 91%   BMI 14.57 kg/m  Pain Scale: 0-10   Pain Score: 0-No pain   SpO2: SpO2: 91 % O2 Device:SpO2: 91 % O2 Flow Rate: .O2 Flow Rate (L/min): 10 L/min  IO: Intake/output summary:  Intake/Output Summary (Last 24 hours) at 06/08/2023 0804 Last data filed at 06/08/2023 0700 Gross per 24 hour  Intake 525.87 ml  Output 4600 ml  Net -4074.13 ml    LBM: Last BM Date :  (PTA) Baseline Weight: Weight: 62.1 kg Most recent weight: Weight: 47.4 kg     Palliative Assessment/Data:    Time Total: 90 min  Greater than 50%  of this time was spent counseling and coordinating care related to the above assessment and plan.  Signed by: Vila Grayer, NP Palliative Medicine Team Pager # [phone] (M-F 8a-5p) Team Phone # [phone] (Nights/Weekends)

## 2023-06-08 NOTE — Progress Notes (Signed)
 [facility] ADULT ICU REPLACEMENT PROTOCOL   The patient does apply for the [facility] Adult ICU Electrolyte Replacment Protocol based on the criteria listed below:   1.Exclusion criteria: TCTS, ECMO, Dialysis, and Myasthenia Gravis patients 2. Is GFR >/= 30 ml/min? Yes.    Patient's GFR today is >60 3. Is SCr </= 2? Yes.   Patient's SCr is 1.04 mg/dL 4. Did SCr increase >/= 0.5 in 24 hours? No. 5.Pt's weight >40kg  Yes.   6. Abnormal electrolyte(s):   Mg 1.8, Phos 2.0  7. Electrolytes replaced per protocol 8.  Call MD STAT for K+ </= 2.5, Phos </= 1, or Mag </= 1 Physician:  Baby Lessen R Lorayne Getchell 06/08/2023 5:41 AM

## 2023-06-08 NOTE — TOC Initial Note (Addendum)
 Transition of Care [facility]) - Initial/Assessment Note    Patient Details  Name: Nathaniel Hicks MRN: [id number] Date of Birth: 01/09/1941  Transition of Care [facility]) CM/SW Contact:    Juliane Och, LCSW Phone Number: 06/08/2023, 2:44 PM  Clinical Narrative:                  2:44 PM Per Palliative Care NP, patient and patient's family are interested in pursing IP hospice with [facility] in [place]. CSW spoke with IP hospice admissions who are to forward CSW contact information to [facility].  3:33 PM Kwante with [facility] contacted CSW and stated that IP residential facility does not current have bed availability. Florinda Hush stated that RN is to evaluate patient at bedside tomorrow afternoon. CSW relayed information to medical team. CSW attempted to call patient's sister, but there were no responses and voicemails were left.  Expected Discharge Plan: Hospice Medical Facility Barriers to Discharge: Hospice Bed not available   Patient Goals and CMS Choice            Expected Discharge Plan and Services In-house Referral: Clinical Social Work   Post Acute Care Choice: Hospice Living arrangements for the past 2 months: Single Family Home                                      Prior Living Arrangements/Services Living arrangements for the past 2 months: Single Family Home Lives with:: Self Patient language and need for interpreter reviewed:: Yes        Need for Family Participation in Patient Care: Yes (Comment)     Criminal Activity/Legal Involvement Pertinent to Current Situation/Hospitalization: No - Comment as needed  Activities of Daily Living   ADL Screening (condition at time of admission) Independently performs ADLs?: No Does the patient have a NEW difficulty with bathing/dressing/toileting/self-feeding that is expected to last >3 days?: Yes (Initiates electronic notice to provider for possible OT consult) Does the patient have a NEW  difficulty with getting in/out of bed, walking, or climbing stairs that is expected to last >3 days?: Yes (Initiates electronic notice to provider for possible PT consult) Does the patient have a NEW difficulty with communication that is expected to last >3 days?: Yes (Initiates electronic notice to provider for possible SLP consult) Is the patient deaf or have difficulty hearing?: Yes Does the patient have difficulty seeing, even when wearing glasses/contacts?: No Does the patient have difficulty concentrating, remembering, or making decisions?: Yes (sometimes)  Permission Sought/Granted Permission sought to share information with : Family Supports, Oceanographer granted to share information with : No (Contact information on chart)  Share Information with NAME: Cashel Bellina  Permission granted to share info w AGENCY: IP Hospice  Permission granted to share info w Relationship: Sister  Permission granted to share info w Contact Information: [phone]  Emotional Assessment       Orientation: : Oriented to Self, Oriented to Place, Oriented to  Time, Oriented to Situation Alcohol / Substance Use: Not Applicable Psych Involvement: No (comment)  Admission diagnosis:  Acute respiratory failure (HCC) [J96.00] Acute respiratory failure with hypoxia (HCC) [J96.01] Acute congestive heart failure, unspecified heart failure type (HCC) [I50.9] Cardiogenic shock (HCC) [R57.0] Patient Active Problem List   Diagnosis Date Noted   Acute respiratory failure (HCC) 06/07/2023   Acute on chronic combined systolic and diastolic CHF (congestive heart failure) (HCC) 06/07/2023  Cardiogenic shock (HCC) 06/07/2023   Pressure injury of skin 06/07/2023   Hypercalcemia 04/21/2023   DOE (dyspnea on exertion) 07/08/2022   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   Tracheitis 11/25/2021   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   History of mouth cancer    Debility  10/28/2021   Malnutrition of moderate degree 10/16/2021   Oral cancer (HCC) 10/14/2021   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HCC) 10/14/2021   Abscess of oral space 07/23/2021   Tobacco use disorder 07/23/2021   Physical deconditioning 07/23/2021   Sepsis (HCC) 07/23/2021   Elevated troponin 07/23/2021   Left facial swelling 07/14/2021   BPH with obstruction/lower urinary tract symptoms 11/05/2020   History of primary testicular cancer; Stage 1b seminoma; 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 & [address] [place] [address] Phone: [phone] Fax: [phone]  [facility] [address] [place] [address] Phone: [phone] Fax: [phone]  [facility] [address], [place] - [address] [place] [address] Phone: [phone] Fax: [phone]     Social Drivers of Health (SDOH) Social History: SDOH Screenings   Food Insecurity: No Food Insecurity (06/07/2023)  Housing: Low Risk  (06/07/2023)  Transportation Needs: No Transportation Needs (06/07/2023)  Utilities: Not At Risk (06/07/2023)  Social Connections: Unknown (06/07/2023)  Tobacco Use: Medium Risk (06/07/2023)   SDOH Interventions:     Readmission Risk Interventions     No data to display

## 2023-06-08 NOTE — Plan of Care (Signed)

## 2023-06-08 NOTE — Progress Notes (Signed)
*  PRELIMINARY RESULTS* Echocardiogram 2D Echocardiogram has been performed.  Nathaniel Hicks 06/08/2023, 11:50 AM

## 2023-06-09 ENCOUNTER — Ambulatory Visit

## 2023-06-09 ENCOUNTER — Ambulatory Visit: Admitting: Hematology

## 2023-06-09 ENCOUNTER — Other Ambulatory Visit

## 2023-06-09 ENCOUNTER — Other Ambulatory Visit: Payer: Self-pay

## 2023-06-09 DIAGNOSIS — R570 Cardiogenic shock: Secondary | ICD-10-CM | POA: Diagnosis not present

## 2023-06-09 DIAGNOSIS — J9601 Acute respiratory failure with hypoxia: Secondary | ICD-10-CM | POA: Diagnosis not present

## 2023-06-09 DIAGNOSIS — I214 Non-ST elevation (NSTEMI) myocardial infarction: Secondary | ICD-10-CM | POA: Diagnosis not present

## 2023-06-10 ENCOUNTER — Inpatient Hospital Stay: Admitting: Dietician

## 2023-06-12 LAB — CULTURE, BLOOD (ROUTINE X 2)
Culture: NO GROWTH
Culture: NO GROWTH
Special Requests: ADEQUATE

## 2023-06-13 NOTE — Progress Notes (Signed)
 Patient received from 86M via bed with no oxygen .  Thrashing around moaning unable to answer questions.  Chaplain in to see patient.  Patient medicated for pain.

## 2023-06-13 NOTE — Progress Notes (Signed)
 Palliative:  HPI: 83 y.o. 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   I discussed with ICU RN who reported he was alert but was restless and awake all note per night shift but he has now moved to 6N. I met today at Mr. Nathaniel Hicks bedside. He is resting comfortably in bed and breathing is regular, unlabored.   I returned to bedside to provide support to family after alerted by spiritual care that they were upset. Upon return [name] has died recently. Family are very upset. I provided support and explained that he was having a very good day yesterday but this may have been a rally - a gift for time for them to enjoy together before the end. We reviewed his declining health with advanced cancer, severe heart failure, and concern for aspiration which likely led to further decline overnight. We reviewed that although he was alert, he was noted to be restless and not comfortably overnight and had already declined since yesterday evening. We reviewed [name] conversation with us  yesterday and that he was at peace recognizing his time was short. He was prepared to go to heaven.   Addressed family concerns to the best of my ability. Emotional support provided.   50 min  Vila Grayer, NP Palliative Medicine Team Pager [phone] (Please see [email or web address] for schedule) Team Phone [phone]

## 2023-06-13 NOTE — Progress Notes (Signed)
 Heart Failure Navigator Progress Note  Assessed for Heart & Vascular TOC clinic readiness.  Patient does not meet criteria due to per MD note patient with history of Metastatic cancer. No HF TOC. .   Navigator will sign off at this time.  Randie Bustle, BSN, Scientist, clinical (histocompatibility and immunogenetics) Only

## 2023-06-13 NOTE — Progress Notes (Signed)
 NAME:  Nathaniel Hicks, MRN:  [id number], DOB:  11-30-1940, LOS: 2 ADMISSION DATE:  06/07/2023, CONSULTATION DATE:  5/26 REFERRING MD:  Dr. 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 Diagnosed back in 2023. He has been on multiple chemotherapies and radiation which have been aborted due to disease progression. Now on palliative weekly docetaxel  started 4/2. Recent admission to [facility] perforated peptic ulcer, which was managed non-operatively. Also carried diagnosis of failure to thrive. He was to have PEG placed, but in the setting of peptic ulcer he is not a candidate. He presented to [facility] ED 5/26 with complaints of shortness of breath and hypoxia via EMS. Oxygen  sats were in the 50's upon EMS arrival and were 80% on a NRB in the ED. CXR with diffuse interstitial prominence concerning for pulmonary edema.  [facility] to [facility] for ICU admission due to high oxygen  requirement. PCCM to admit.   Pertinent  Medical History   has a past medical history of Arthritis, BPH (benign prostatic hyperplasia), Difficult intubation, HOH (hard of hearing), Hyperlipidemia (11/02/2017), Hypertension, Hypertension (11/02/2017), 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HCC), and Vitamin D deficiency (11/02/2017).   Significant Hospital Events: Including procedures, antibiotic start and stop dates in addition to other pertinent events     Interim History / Subjective:  Transition to wound comfort care focus on 5/27 Denies pain currently, scheduled Robinul , morphine  and Tylenol  available    Objective    Blood pressure 119/82, pulse 100, temperature 98.4 F (36.9 C), temperature source Axillary, resp. rate 17, height 5\' 11"  (1.803 m), weight 45.3 kg, SpO2 93%.        Intake/Output Summary (Last 24 hours) at 05/17/2023 4098 Last data filed  at 06/08/2023 1800 Gross per 24 hour  Intake 361.04 ml  Output 1950 ml  Net -1588.96 ml   Filed Weights   06/07/23 1528 06/08/23 0500 06/08/23 0830  Weight: 48.2 kg 47.4 kg 45.3 kg    Examination: General: Thin elderly gentleman in bed, no distress, has hand mittens in place HENT: Large right mandible, somewhat dysarthric, no significant secretions Lungs: Few scattered bilateral crackles Cardiovascular: Regular, tachycardic 110, no murmur Abdomen: Nondistended with positive bowel sounds Extremities: No deformities Neuro: Awake, answers questions, follows commands, dysarthric, mild agitation but easily redirectable  Resolved problem list   Assessment and Plan   Acute respiratory failure with hypoxia: likely due to CHF/pulmonary edema. Cannot rule out PNA or viral illness but workup consistent with pulmonary edema -Support with supplemental oxygen , comfort based medications of any evidence of increased work of breathing  Cardiogenic shock, stabilizing -Defer any further inotropic medications, pressors  NSTEMI: likely demand in the setting of cardiogenic shock. -Appreciate cardiology input - Heparin  deferred  Stage IV squamous cell carcinoma of the mouth Hx testicular cancer.  -Appreciate palliative care medicine input.  DNR status confirmed.  Agree with transition to comfort.  Avoiding labs, continue comfort based medications.  Consider comfort based feeding even with known aspiration risk -Should be able to transition to floor bed 5/28 -Ultimate goal is to go to inpatient hospice care close to family    Best Practice (right click and "Reselect all SmartList Selections" daily)   Diet/type: NPO, comfort-based feeds DVT prophylaxis not indicated Pressure ulcer(s): pressure ulcer assessment deferred  GI prophylaxis: N/A Lines: Central line -port Foley:  Yes, and it is still needed Code Status:  DNR Last date of multidisciplinary goals of care discussion [/27/25 with  palliative care]  Labs   CBC: Recent Labs  Lab 06/02/23 1118 06/07/23 1130 06/08/23 0244  WBC 11.0* 11.5* 12.0*  NEUTROABS 9.4* 10.2*  --   HGB 11.5* 10.6* 11.1*  HCT 35.1* 33.0* 34.0*  MCV 100.6* 103.1* 99.4  PLT 306 267 256    Basic Metabolic Panel: Recent Labs  Lab 06/02/23 1118 06/07/23 1130 06/08/23 0244  NA 136 137 146*  K 3.2* 3.7 3.8  CL 109 109 112*  CO2 20* 17* 22  GLUCOSE 106* 94 105*  BUN 10 18 18   CREATININE 0.61 1.18 1.04  CALCIUM 9.2 9.1 9.4  MG 1.7  --  1.8  PHOS  --   --  2.0*   GFR: Estimated Creatinine Clearance: 35.1 mL/min (by C-G formula based on SCr of 1.04 mg/dL). Recent Labs  Lab 06/02/23 1118 06/07/23 1130 06/07/23 1541 06/08/23 0244 06/08/23 0848  PROCALCITON  --   --   --  0.48 0.43  WBC 11.0* 11.5*  --  12.0*  --   LATICACIDVEN  --  5.9* 4.7*  --   --     Liver Function Tests: Recent Labs  Lab 06/02/23 1118 06/07/23 1130  AST 47* 63*  ALT 24 31  ALKPHOS 71 114  BILITOT 0.9 1.1  PROT 5.8* 6.2*  ALBUMIN 2.2* 2.4*   No results for input(s): "LIPASE", "AMYLASE" in the last 168 hours. No results for input(s): "AMMONIA" in the last 168 hours.  ABG    Component Value Date/Time   PHART 7.412 07/14/2021 1501   PCO2ART 39.4 07/14/2021 1501   PO2ART 284 (H) 07/14/2021 1501   HCO3 20.8 06/07/2023 1541   TCO2 26 07/14/2021 1501   ACIDBASEDEF 2.8 (H) 06/07/2023 1541   O2SAT 64.9 06/08/2023 0942     Coagulation Profile: Recent Labs  Lab 06/07/23 1130  INR 1.3*    Cardiac Enzymes: No results for input(s): "CKTOTAL", "CKMB", "CKMBINDEX", "TROPONINI" in the last 168 hours.  HbA1C: Hgb A1c MFr Bld  Date/Time Value Ref Range Status  06/07/2023 05:13 PM 4.1 (L) 4.8 - 5.6 % Final    Comment:    (NOTE) Pre diabetes:          5.7%-6.4%  Diabetes:              >6.4%  Glycemic control for   <7.0% adults with diabetes     CBG: Recent Labs  Lab 06/07/23 1913 06/07/23 2308 06/08/23 0315 06/08/23 0742  06/08/23 1126  GLUCAP 108* 95 90 93 106*     Critical care time: NA     Racheal Buddle, MD, PhD 05/15/2023, 8:26 AM  [facility] [phone] or if no answer before 7:00PM call [phone] For any issues after 7:00PM please call eLink [phone]

## 2023-06-13 NOTE — Progress Notes (Signed)
 This chaplain responded to PMT NP-Alicia consult for EOL spiritual care and prayer. The chaplain understands the Pt. just arrived to the unit. The RN is at the bedside.   The Pt. is able to tell the chaplain he is an older brother. The Pt. accepted the chaplain's invitation for prayer and holding his hand. The Pt. is resting comfortably when the chaplain left the room.  Chaplain Kathleene Papas [phone]

## 2023-06-13 DEATH — deceased

## 2023-06-16 ENCOUNTER — Inpatient Hospital Stay

## 2023-06-16 ENCOUNTER — Inpatient Hospital Stay: Admitting: Hematology

## 2023-07-13 NOTE — Death Summary Note (Signed)
 DEATH SUMMARY   Patient Details  Name: Nathaniel Hicks MRN: [id number] DOB: [birth year]  Admission/Discharge Information   Admit Date:  22-Jun-2023  Date of Death: Date of Death: 2023-06-24  Time of Death: Time of Death: 1300  Length of Stay: 2  Referring Physician: Omie Bickers, MD   Reason(s) for Hospitalization  Acute respiratory failure with hypoxemia  Diagnoses  Preliminary cause of death:  Secondary Diagnoses (including complications and co-morbidities):  Principal Problem:   Acute respiratory failure (HCC) Active Problems:   Acute on chronic combined systolic and diastolic CHF (congestive heart failure) (HCC)   Cardiogenic shock (HCC)   Pressure injury of skin Non-ST elevation MI Stage IV squamous cell cancer of the floor the mouth Peptic ulcer disease with recent perforated peptic ulcer Dysphagia Possible aspiration pneumonia/pneumonitis Acute metabolic encephalopathy Hypertension Vitamin D deficiency History of testicular cancer BPH Failure to thrive Hypophosphatemia Anemia of chronic disease  Brief Hospital Course (including significant findings, care, treatment, and services provided and events leading to death)  KEAWE MARCELLO is a 83 y.o. 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 Diagnosed back in 2023. He has been on multiple chemotherapies and radiation which have been aborted due to disease progression. Now on palliative weekly docetaxel  started 04/14/23.  He had a recent admission to [facility] for a perforated peptic ulcer, which was managed non-operatively. Also carried diagnosis of failure to thrive. He was to have PEG placed, but in the setting of peptic ulcer he is not a candidate.   He presented to [facility] ED 06-22-23 with complaints of shortness of breath and hypoxia via EMS. Oxygen  sats were in the 50's upon EMS arrival and were 80% on a NRB in the ED. CXR with diffuse interstitial prominence  concerning for pulmonary edema. Transferred to [facility] for ICU admission due to high oxygen  requirement. PCCM to admit.  He was treated empirically with pressors, milrinone , diuretics.  Also treated for possible aspiration pneumonia with broad-spectrum antibiotics although it was felt that his infiltrates were more consistent with cardiogenic pulmonary edema.  He was not a candidate for left heart catheterization.  He continued to require pressors, had progressive hypoxemia, encephalopathy and confusion.  Discussions undertaken with the patient's family regarding both his acute and underlying medical problems, the likelihood that he would not survive this illness.  Decision was made on 5/27 to transition to comfort.  He expired on Jun 24, 2023 at 1300.     Pertinent Labs and Studies  Significant Diagnostic Studies ECHOCARDIOGRAM COMPLETE Result Date: 06/08/2023    ECHOCARDIOGRAM REPORT   Patient Name:   Nathaniel Hicks Date of Exam: 06/08/2023 Medical Rec #:  [id number]      Height:       71.0 in Accession #:    [id number]     Weight:       104.5 lb Date of Birth:  1940/10/05      BSA:          1.600 m Patient Age:    83 years       BP:           103/53 mmHg Patient Gender: M              HR:           83 bpm. Exam Location:  Inpatient Procedure: 2D Echo, Cardiac Doppler and Color Doppler (Both Spectral and Color  Flow Doppler were utilized during procedure). Indications:    Shock  History:        Patient has no prior history of Echocardiogram examinations.                 Risk Factors:Hypertension.  Sonographer:    Andrena Bang Referring Phys: [phone] Ky Phillips HOFFMAN IMPRESSIONS  1. Left ventricular ejection fraction, by estimation, is 20 to 25%. The left ventricle has severely decreased function. The left ventricle demonstrates global hypokinesis. The left ventricular internal cavity size was mildly dilated. Left ventricular diastolic parameters are indeterminate.  2. Right ventricular systolic function  is moderately reduced. The right ventricular size is mildly enlarged.  3. The mitral valve is normal in structure. Trivial mitral valve regurgitation. No evidence of mitral stenosis.  4. The aortic valve is tricuspid. Aortic valve regurgitation is moderate. Aortic valve sclerosis is present, with no evidence of aortic valve stenosis.  5. The inferior vena cava is normal in size with greater than 50% respiratory variability, suggesting right atrial pressure of 3 mmHg. Comparison(s): No prior Echocardiogram. FINDINGS  Left Ventricle: Left ventricular ejection fraction, by estimation, is 20 to 25%. The left ventricle has severely decreased function. The left ventricle demonstrates global hypokinesis. Definity  contrast agent was given IV to delineate the left ventricular endocardial borders. The left ventricular internal cavity size was mildly dilated. There is no left ventricular hypertrophy. Left ventricular diastolic parameters are indeterminate. Right Ventricle: The right ventricular size is mildly enlarged. Right ventricular systolic function is moderately reduced. Left Atrium: Left atrial size was normal in size. Right Atrium: Right atrial size was normal in size. Pericardium: There is no evidence of pericardial effusion. Mitral Valve: The mitral valve is normal in structure. Trivial mitral valve regurgitation. No evidence of mitral valve stenosis. Tricuspid Valve: The tricuspid valve is normal in structure. Tricuspid valve regurgitation is trivial. No evidence of tricuspid stenosis. Aortic Valve: The aortic valve is tricuspid. Aortic valve regurgitation is moderate. Aortic valve sclerosis is present, with no evidence of aortic valve stenosis. Aortic valve mean gradient measures 6.0 mmHg. Aortic valve peak gradient measures 11.8 mmHg. Aortic valve area, by VTI measures 1.74 cm. Pulmonic Valve: The pulmonic valve was normal in structure. Pulmonic valve regurgitation is trivial. No evidence of pulmonic stenosis.  Aorta: The aortic root is normal in size and structure. Venous: The inferior vena cava is normal in size with greater than 50% respiratory variability, suggesting right atrial pressure of 3 mmHg. IAS/Shunts: The interatrial septum was not well visualized.  LEFT VENTRICLE PLAX 2D LVIDd:         5.50 cm      Diastology LVIDs:         5.20 cm      LV e' medial:  6.42 cm/s LV PW:         0.80 cm      LV e' lateral: 5.11 cm/s LV IVS:        1.00 cm LVOT diam:     2.30 cm LV SV:         51 LV SV Index:   32 LVOT Area:     4.15 cm  LV Volumes (MOD) LV vol d, MOD A2C: 181.0 ml LV vol d, MOD A4C: 177.0 ml LV vol s, MOD A2C: 143.0 ml LV vol s, MOD A4C: 132.0 ml LV SV MOD A2C:     38.0 ml LV SV MOD A4C:     177.0 ml LV SV MOD BP:  45.4 ml RIGHT VENTRICLE RV S prime:     12.90 cm/s TAPSE (M-mode): 1.1 cm LEFT ATRIUM             Index LA diam:        3.20 cm 2.00 cm/m LA Vol (A2C):   66.9 ml 41.81 ml/m LA Vol (A4C):   35.6 ml 22.25 ml/m LA Biplane Vol: 53.3 ml 33.31 ml/m  AORTIC VALVE AV Area (Vmax):    2.06 cm AV Area (Vmean):   1.51 cm AV Area (VTI):     1.74 cm AV Vmax:           172.00 cm/s AV Vmean:          120.000 cm/s AV VTI:            0.291 m AV Peak Grad:      11.8 mmHg AV Mean Grad:      6.0 mmHg LVOT Vmax:         85.40 cm/s LVOT Vmean:        43.700 cm/s LVOT VTI:          0.122 m LVOT/AV VTI ratio: 0.42  AORTA Ao Asc diam: 3.00 cm  SHUNTS Systemic VTI:  0.12 m Systemic Diam: 2.30 cm Alexandria Angel MD Electronically signed by Alexandria Angel MD Signature Date/Time: 06/08/2023/12:54:42 PM    Final    DG Chest Port 1 View Result Date: 06/07/2023 CLINICAL DATA:  hypoxia EXAM: PORTABLE CHEST - 1 VIEW COMPARISON:  08/04/2022. FINDINGS: Cardiac silhouette is prominent. There is pulmonary interstitial prominence with vascular congestion. Right basilar consolidation may represent pneumonia. No pneumothorax or pleural effusion identified. Left-sided Port-A-Cath tip proximal SVC. IMPRESSION: Findings suggest  CHF. Right base consolidation or volume loss. Electronically Signed   By: Sydell Eva M.D.   On: 06/07/2023 11:43    Microbiology Recent Results (from the past 240 hours)  Resp panel by RT-PCR (RSV, Flu A&B, Covid) Anterior Nasal Swab     Status: None   Collection Time: 06/07/23 12:00 PM   Specimen: Anterior Nasal Swab  Result Value Ref Range Status   SARS Coronavirus 2 by RT PCR NEGATIVE NEGATIVE Final    Comment: (NOTE) SARS-CoV-2 target nucleic acids are NOT DETECTED.  The SARS-CoV-2 RNA is generally detectable in upper respiratory specimens during the acute phase of infection. The lowest concentration of SARS-CoV-2 viral copies this assay can detect is 138 copies/mL. A negative result does not preclude SARS-Cov-2 infection and should not be used as the sole basis for treatment or other patient management decisions. A negative result may occur with  improper specimen collection/handling, submission of specimen other than nasopharyngeal swab, presence of viral mutation(s) within the areas targeted by this assay, and inadequate number of viral copies(<138 copies/mL). A negative result must be combined with clinical observations, patient history, and epidemiological information. The expected result is Negative.  Fact Sheet for Patients:  BloggerCourse.com  Fact Sheet for Healthcare Providers:  SeriousBroker.it  This test is no t yet approved or cleared by the [place]  FDA and  has been authorized for detection and/or diagnosis of SARS-CoV-2 by FDA under an Emergency Use Authorization (EUA). This EUA will remain  in effect (meaning this test can be used) for the duration of the COVID-19 declaration under Section 564(b)(1) of the Act, 21 U.S.C.section 360bbb-3(b)(1), unless the authorization is terminated  or revoked sooner.       Influenza A by PCR NEGATIVE NEGATIVE Final   Influenza B by PCR NEGATIVE  NEGATIVE Final     Comment: (NOTE) The Xpert Xpress SARS-CoV-2/FLU/RSV plus assay is intended as an aid in the diagnosis of influenza from Nasopharyngeal swab specimens and should not be used as a sole basis for treatment. Nasal washings and aspirates are unacceptable for Xpert Xpress SARS-CoV-2/FLU/RSV testing.  Fact Sheet for Patients: BloggerCourse.com  Fact Sheet for Healthcare Providers: SeriousBroker.it  This test is not yet approved or cleared by the [place]  FDA and has been authorized for detection and/or diagnosis of SARS-CoV-2 by FDA under an Emergency Use Authorization (EUA). This EUA will remain in effect (meaning this test can be used) for the duration of the COVID-19 declaration under Section 564(b)(1) of the Act, 21 U.S.C. section 360bbb-3(b)(1), unless the authorization is terminated or revoked.     Resp Syncytial Virus by PCR NEGATIVE NEGATIVE Final    Comment: (NOTE) Fact Sheet for Patients: BloggerCourse.com  Fact Sheet for Healthcare Providers: SeriousBroker.it  This test is not yet approved or cleared by the [place]  FDA and has been authorized for detection and/or diagnosis of SARS-CoV-2 by FDA under an Emergency Use Authorization (EUA). This EUA will remain in effect (meaning this test can be used) for the duration of the COVID-19 declaration under Section 564(b)(1) of the Act, 21 U.S.C. section 360bbb-3(b)(1), unless the authorization is terminated or revoked.  Performed at [facility], [address] [place], [place] [address]   Blood Culture (routine x 2)     Status: None   Collection Time: 06/07/23  1:02 PM   Specimen: BLOOD  Result Value Ref Range Status   Specimen Description BLOOD BLOOD RIGHT HAND  Final   Special Requests   Final    BOTTLES DRAWN AEROBIC AND ANAEROBIC Blood Culture adequate volume   Culture   Final    NO GROWTH 5 DAYS Performed  at [facility], [address] [place], [place] [address]    Report Status 06/12/2023 FINAL  Final  Blood Culture (routine x 2)     Status: None   Collection Time: 06/07/23  1:02 PM   Specimen: BLOOD  Result Value Ref Range Status   Specimen Description BLOOD BLOOD LEFT HAND  Final   Special Requests   Final    AEROBIC BOTTLE ONLY Blood Culture results may not be optimal due to an inadequate volume of blood received in culture bottles   Culture   Final    NO GROWTH 5 DAYS Performed at [facility], [address], [place], [place] [address]    Report Status 06/12/2023 FINAL  Final  MRSA Next Gen by PCR, Nasal     Status: None   Collection Time: 06/07/23  3:26 PM   Specimen: Nasal Mucosa; Nasal Swab  Result Value Ref Range Status   MRSA by PCR Next Gen NOT DETECTED NOT DETECTED Final    Comment: (NOTE) The GeneXpert MRSA Assay (FDA approved for NASAL specimens only), is one component of a comprehensive MRSA colonization surveillance program. It is not intended to diagnose MRSA infection nor to guide or monitor treatment for MRSA infections. Test performance is not FDA approved in patients less than 68 years old. Performed at [facility], [address], [place], [place] [address]   Respiratory (~20 pathogens) panel by PCR     Status: None   Collection Time: 06/07/23  4:24 PM   Specimen: Nasopharyngeal Swab; Respiratory  Result Value Ref Range Status   Adenovirus NOT DETECTED NOT DETECTED Final   Coronavirus 229E NOT DETECTED  NOT DETECTED Final    Comment: (NOTE) The Coronavirus on the Respiratory Panel, DOES NOT test for the novel  Coronavirus (2019 nCoV)    Coronavirus HKU1 NOT DETECTED NOT DETECTED Final   Coronavirus NL63 NOT DETECTED NOT DETECTED Final   Coronavirus OC43 NOT DETECTED NOT DETECTED Final   Metapneumovirus NOT DETECTED NOT DETECTED Final   Rhinovirus / Enterovirus NOT DETECTED NOT DETECTED Final   Influenza A NOT DETECTED NOT DETECTED Final    Influenza B NOT DETECTED NOT DETECTED Final   Parainfluenza Virus 1 NOT DETECTED NOT DETECTED Final   Parainfluenza Virus 2 NOT DETECTED NOT DETECTED Final   Parainfluenza Virus 3 NOT DETECTED NOT DETECTED Final   Parainfluenza Virus 4 NOT DETECTED NOT DETECTED Final   Respiratory Syncytial Virus NOT DETECTED NOT DETECTED Final   Bordetella pertussis NOT DETECTED NOT DETECTED Final   Bordetella Parapertussis NOT DETECTED NOT DETECTED Final   Chlamydophila pneumoniae NOT DETECTED NOT DETECTED Final   Mycoplasma pneumoniae NOT DETECTED NOT DETECTED Final    Comment: Performed at [facility], [address], [place], [place] [address]    Lab Basic Metabolic Panel: Recent Labs  Lab 06/08/23 0244  NA 146*  K 3.8  CL 112*  CO2 22  GLUCOSE 105*  BUN 18  CREATININE 1.04  CALCIUM 9.4  MG 1.8  PHOS 2.0*   Liver Function Tests: No results for input(s): "AST", "ALT", "ALKPHOS", "BILITOT", "PROT", "ALBUMIN" in the last 168 hours. No results for input(s): "LIPASE", "AMYLASE" in the last 168 hours. No results for input(s): "AMMONIA" in the last 168 hours. CBC: Recent Labs  Lab 06/08/23 0244  WBC 12.0*  HGB 11.1*  HCT 34.0*  MCV 99.4  PLT 256   Cardiac Enzymes: No results for input(s): "CKTOTAL", "CKMB", "CKMBINDEX", "TROPONINI" in the last 168 hours. Sepsis Labs: Recent Labs  Lab 06/07/23 1541 06/08/23 0244 06/08/23 0848  PROCALCITON  --  0.48 0.43  WBC  --  12.0*  --   LATICACIDVEN 4.7*  --   --       Denson Flake 06/14/2023, 2:05 PM

## 2023-07-13 DEATH — deceased
